# Patient Record
Sex: Male | Born: 1940 | Race: White | Hispanic: No | Marital: Married | Smoking: Former smoker
Health system: Southern US, Community
[De-identification: ages and names within clinical notes are randomized; demographics above are authoritative.]

## PROBLEM LIST (undated history)

## (undated) DIAGNOSIS — I10 Essential (primary) hypertension: Secondary | ICD-10-CM

## (undated) DIAGNOSIS — G4730 Sleep apnea, unspecified: Secondary | ICD-10-CM

## (undated) DIAGNOSIS — Z9289 Personal history of other medical treatment: Secondary | ICD-10-CM

## (undated) DIAGNOSIS — Z789 Other specified health status: Secondary | ICD-10-CM

## (undated) DIAGNOSIS — C801 Malignant (primary) neoplasm, unspecified: Secondary | ICD-10-CM

## (undated) DIAGNOSIS — J449 Chronic obstructive pulmonary disease, unspecified: Secondary | ICD-10-CM

## (undated) DIAGNOSIS — N400 Enlarged prostate without lower urinary tract symptoms: Secondary | ICD-10-CM

## (undated) DIAGNOSIS — D649 Anemia, unspecified: Secondary | ICD-10-CM

## (undated) DIAGNOSIS — N186 End stage renal disease: Secondary | ICD-10-CM

## (undated) DIAGNOSIS — N189 Chronic kidney disease, unspecified: Secondary | ICD-10-CM

## (undated) DIAGNOSIS — E785 Hyperlipidemia, unspecified: Secondary | ICD-10-CM

## (undated) HISTORY — PX: SKIN CANCER EXCISION: SHX779

## (undated) HISTORY — DX: Essential (primary) hypertension: I10

## (undated) HISTORY — DX: Hyperlipidemia, unspecified: E78.5

## (undated) HISTORY — PX: TONSILLECTOMY AND ADENOIDECTOMY: SHX28

## (undated) HISTORY — PX: BACK SURGERY: SHX140

---

## 1998-04-20 ENCOUNTER — Other Ambulatory Visit: Admission: RE | Admit: 1998-04-20 | Discharge: 1998-04-20 | Payer: Self-pay | Admitting: Urology

## 1999-07-06 ENCOUNTER — Other Ambulatory Visit: Admission: RE | Admit: 1999-07-06 | Discharge: 1999-07-06 | Payer: Self-pay | Admitting: Gastroenterology

## 1999-07-06 ENCOUNTER — Encounter (INDEPENDENT_AMBULATORY_CARE_PROVIDER_SITE_OTHER): Payer: Self-pay | Admitting: Specialist

## 2004-05-28 ENCOUNTER — Encounter (INDEPENDENT_AMBULATORY_CARE_PROVIDER_SITE_OTHER): Payer: Self-pay | Admitting: Specialist

## 2004-05-28 ENCOUNTER — Ambulatory Visit (HOSPITAL_BASED_OUTPATIENT_CLINIC_OR_DEPARTMENT_OTHER): Admission: RE | Admit: 2004-05-28 | Discharge: 2004-05-28 | Payer: Self-pay | Admitting: Urology

## 2004-05-28 ENCOUNTER — Ambulatory Visit (HOSPITAL_COMMUNITY): Admission: RE | Admit: 2004-05-28 | Discharge: 2004-05-28 | Payer: Self-pay | Admitting: Urology

## 2005-05-31 ENCOUNTER — Encounter (INDEPENDENT_AMBULATORY_CARE_PROVIDER_SITE_OTHER): Payer: Self-pay | Admitting: *Deleted

## 2005-05-31 ENCOUNTER — Ambulatory Visit (HOSPITAL_BASED_OUTPATIENT_CLINIC_OR_DEPARTMENT_OTHER): Admission: RE | Admit: 2005-05-31 | Discharge: 2005-05-31 | Payer: Self-pay | Admitting: Urology

## 2005-05-31 ENCOUNTER — Ambulatory Visit (HOSPITAL_COMMUNITY): Admission: RE | Admit: 2005-05-31 | Discharge: 2005-05-31 | Payer: Self-pay | Admitting: Urology

## 2005-06-10 ENCOUNTER — Ambulatory Visit: Payer: Self-pay | Admitting: Pulmonary Disease

## 2006-06-10 ENCOUNTER — Ambulatory Visit: Payer: Self-pay | Admitting: Pulmonary Disease

## 2006-06-13 ENCOUNTER — Ambulatory Visit: Payer: Self-pay

## 2007-06-11 ENCOUNTER — Ambulatory Visit: Payer: Self-pay | Admitting: Pulmonary Disease

## 2007-06-11 LAB — CONVERTED CEMR LAB
ALT: 22 units/L (ref 0–53)
AST: 18 units/L (ref 0–37)
Albumin: 4.1 g/dL (ref 3.5–5.2)
Alkaline Phosphatase: 65 units/L (ref 39–117)
BUN: 23 mg/dL (ref 6–23)
Basophils Absolute: 0 10*3/uL (ref 0.0–0.1)
Basophils Relative: 0.1 % (ref 0.0–1.0)
Bilirubin, Direct: 0.2 mg/dL (ref 0.0–0.3)
CO2: 29 meq/L (ref 19–32)
Calcium: 9.4 mg/dL (ref 8.4–10.5)
Chloride: 108 meq/L (ref 96–112)
Cholesterol: 144 mg/dL (ref 0–200)
Creatinine, Ser: 1.1 mg/dL (ref 0.4–1.5)
Eosinophils Absolute: 0.2 10*3/uL (ref 0.0–0.6)
Eosinophils Relative: 2.4 % (ref 0.0–5.0)
GFR calc Af Amer: 86 mL/min
GFR calc non Af Amer: 71 mL/min
Glucose, Bld: 104 mg/dL — ABNORMAL HIGH (ref 70–99)
HCT: 46.8 % (ref 39.0–52.0)
HDL: 41.4 mg/dL (ref >39.0)
Hemoglobin: 16.1 g/dL (ref 13.0–17.0)
LDL Cholesterol: 90 mg/dL (ref 0–99)
Lymphocytes Relative: 20.3 % (ref 12.0–46.0)
MCHC: 34.5 g/dL (ref 30.0–36.0)
MCV: 91.9 fL (ref 78.0–100.0)
Monocytes Absolute: 0.7 10*3/uL (ref 0.2–0.7)
Monocytes Relative: 7.9 % (ref 3.0–11.0)
Neutro Abs: 6.5 10*3/uL (ref 1.4–7.7)
Neutrophils Relative %: 69.3 % (ref 43.0–77.0)
PSA: 3.68 ng/mL (ref 0.10–4.00)
Platelets: 263 10*3/uL (ref 150–400)
Potassium: 4.6 meq/L (ref 3.5–5.1)
RBC: 5.09 M/uL (ref 4.22–5.81)
RDW: 12.9 % (ref 11.5–14.6)
Sodium: 142 meq/L (ref 135–145)
TSH: 0.7 microintl units/mL (ref 0.35–5.50)
Total Bilirubin: 1.3 mg/dL — ABNORMAL HIGH (ref 0.3–1.2)
Total CHOL/HDL Ratio: 3.5
Total Protein: 7.5 g/dL (ref 6.0–8.3)
Triglycerides: 64 mg/dL (ref 0–149)
VLDL: 13 mg/dL (ref 0–40)
WBC: 9.3 10*3/uL (ref 4.5–10.5)

## 2007-07-27 ENCOUNTER — Ambulatory Visit: Payer: Self-pay | Admitting: Internal Medicine

## 2007-08-10 ENCOUNTER — Encounter: Payer: Self-pay | Admitting: Internal Medicine

## 2007-08-10 ENCOUNTER — Ambulatory Visit: Payer: Self-pay | Admitting: Internal Medicine

## 2007-12-07 ENCOUNTER — Telehealth: Payer: Self-pay | Admitting: Pulmonary Disease

## 2007-12-07 DIAGNOSIS — D126 Benign neoplasm of colon, unspecified: Secondary | ICD-10-CM | POA: Insufficient documentation

## 2007-12-07 DIAGNOSIS — E7800 Pure hypercholesterolemia, unspecified: Secondary | ICD-10-CM | POA: Insufficient documentation

## 2007-12-07 DIAGNOSIS — N138 Other obstructive and reflux uropathy: Secondary | ICD-10-CM

## 2007-12-07 DIAGNOSIS — N401 Enlarged prostate with lower urinary tract symptoms: Secondary | ICD-10-CM

## 2007-12-07 DIAGNOSIS — K649 Unspecified hemorrhoids: Secondary | ICD-10-CM | POA: Insufficient documentation

## 2007-12-07 DIAGNOSIS — I10 Essential (primary) hypertension: Secondary | ICD-10-CM

## 2008-08-02 ENCOUNTER — Telehealth (INDEPENDENT_AMBULATORY_CARE_PROVIDER_SITE_OTHER): Payer: Self-pay | Admitting: *Deleted

## 2008-08-18 ENCOUNTER — Encounter: Payer: Self-pay | Admitting: Pulmonary Disease

## 2008-10-10 ENCOUNTER — Ambulatory Visit: Payer: Self-pay | Admitting: Pulmonary Disease

## 2008-10-10 DIAGNOSIS — C679 Malignant neoplasm of bladder, unspecified: Secondary | ICD-10-CM

## 2008-10-10 DIAGNOSIS — J42 Unspecified chronic bronchitis: Secondary | ICD-10-CM

## 2008-10-10 DIAGNOSIS — K5730 Diverticulosis of large intestine without perforation or abscess without bleeding: Secondary | ICD-10-CM

## 2008-10-16 LAB — CONVERTED CEMR LAB
ALT: 33 units/L (ref 0–53)
AST: 21 units/L (ref 0–37)
Albumin: 4 g/dL (ref 3.5–5.2)
Alkaline Phosphatase: 58 units/L (ref 39–117)
BUN: 25 mg/dL — ABNORMAL HIGH (ref 6–23)
Basophils Absolute: 0 10*3/uL (ref 0.0–0.1)
Basophils Relative: 0.5 % (ref 0.0–3.0)
Bilirubin, Direct: 0.1 mg/dL (ref 0.0–0.3)
CO2: 30 meq/L (ref 19–32)
Calcium: 9.2 mg/dL (ref 8.4–10.5)
Chloride: 106 meq/L (ref 96–112)
Cholesterol: 173 mg/dL (ref 0–200)
Creatinine, Ser: 1.1 mg/dL (ref 0.4–1.5)
Eosinophils Absolute: 0.1 10*3/uL (ref 0.0–0.7)
Eosinophils Relative: 1.7 % (ref 0.0–5.0)
GFR calc Af Amer: 86 mL/min
GFR calc non Af Amer: 71 mL/min
Glucose, Bld: 105 mg/dL — ABNORMAL HIGH (ref 70–99)
HCT: 47.2 % (ref 39.0–52.0)
HDL: 51 mg/dL (ref >39.0)
Hemoglobin: 16.4 g/dL (ref 13.0–17.0)
LDL Cholesterol: 106 mg/dL — ABNORMAL HIGH (ref 0–99)
Lymphocytes Relative: 26.1 % (ref 12.0–46.0)
MCHC: 34.8 g/dL (ref 30.0–36.0)
MCV: 91.8 fL (ref 78.0–100.0)
Monocytes Absolute: 0.5 10*3/uL (ref 0.1–1.0)
Monocytes Relative: 7.5 % (ref 3.0–12.0)
Neutro Abs: 4.4 10*3/uL (ref 1.4–7.7)
Neutrophils Relative %: 64.2 % (ref 43.0–77.0)
Platelets: 209 10*3/uL (ref 150–400)
Potassium: 4.4 meq/L (ref 3.5–5.1)
RBC: 5.14 M/uL (ref 4.22–5.81)
RDW: 12.8 % (ref 11.5–14.6)
Sodium: 141 meq/L (ref 135–145)
TSH: 0.87 microintl units/mL (ref 0.35–5.50)
Total Bilirubin: 1 mg/dL (ref 0.3–1.2)
Total CHOL/HDL Ratio: 3.4
Total Protein: 7.3 g/dL (ref 6.0–8.3)
Triglycerides: 82 mg/dL (ref 0–149)
VLDL: 16 mg/dL (ref 0–40)
Vit D, 1,25-Dihydroxy: 22 — ABNORMAL LOW (ref 30–89)
WBC: 6.8 10*3/uL (ref 4.5–10.5)

## 2008-11-20 DIAGNOSIS — E663 Overweight: Secondary | ICD-10-CM | POA: Insufficient documentation

## 2008-11-20 DIAGNOSIS — K8020 Calculus of gallbladder without cholecystitis without obstruction: Secondary | ICD-10-CM | POA: Insufficient documentation

## 2009-09-01 ENCOUNTER — Encounter: Payer: Self-pay | Admitting: Pulmonary Disease

## 2009-10-13 ENCOUNTER — Encounter: Payer: Self-pay | Admitting: Adult Health

## 2009-10-13 ENCOUNTER — Ambulatory Visit: Payer: Self-pay | Admitting: Pulmonary Disease

## 2009-10-13 ENCOUNTER — Ambulatory Visit: Payer: Self-pay

## 2009-10-13 DIAGNOSIS — R609 Edema, unspecified: Secondary | ICD-10-CM

## 2009-10-16 LAB — CONVERTED CEMR LAB
BUN: 18 mg/dL (ref 6–23)
CO2: 31 meq/L (ref 19–32)
Calcium: 9.3 mg/dL (ref 8.4–10.5)
Chloride: 101 meq/L (ref 96–112)
Creatinine, Ser: 1 mg/dL (ref 0.4–1.5)
GFR calc non Af Amer: 78.66 mL/min (ref >60)
Glucose, Bld: 102 mg/dL — ABNORMAL HIGH (ref 70–99)
Potassium: 4.6 meq/L (ref 3.5–5.1)
Pro B Natriuretic peptide (BNP): 73 pg/mL (ref 0.0–100.0)
Sodium: 138 meq/L (ref 135–145)
TSH: 1.44 microintl units/mL (ref 0.35–5.50)

## 2010-01-15 ENCOUNTER — Ambulatory Visit: Payer: Self-pay | Admitting: Pulmonary Disease

## 2010-01-21 LAB — CONVERTED CEMR LAB
ALT: 27 units/L (ref 0–53)
AST: 19 units/L (ref 0–37)
Albumin: 4.2 g/dL (ref 3.5–5.2)
Alkaline Phosphatase: 57 units/L (ref 39–117)
BUN: 18 mg/dL (ref 6–23)
Basophils Absolute: 0 10*3/uL (ref 0.0–0.1)
Basophils Relative: 0.5 % (ref 0.0–3.0)
Bilirubin Urine: NEGATIVE
Bilirubin, Direct: 0.3 mg/dL (ref 0.0–0.3)
CO2: 31 meq/L (ref 19–32)
Calcium: 9.3 mg/dL (ref 8.4–10.5)
Chloride: 105 meq/L (ref 96–112)
Cholesterol: 158 mg/dL (ref 0–200)
Creatinine, Ser: 1.1 mg/dL (ref 0.4–1.5)
Eosinophils Absolute: 0.2 10*3/uL (ref 0.0–0.7)
Eosinophils Relative: 2.4 % (ref 0.0–5.0)
GFR calc non Af Amer: 70.41 mL/min (ref >60)
Glucose, Bld: 103 mg/dL — ABNORMAL HIGH (ref 70–99)
HCT: 48.3 % (ref 39.0–52.0)
HDL: 53.8 mg/dL (ref >39.00)
Hemoglobin: 16.3 g/dL (ref 13.0–17.0)
Ketones, ur: NEGATIVE mg/dL
LDL Cholesterol: 81 mg/dL (ref 0–99)
Lymphocytes Relative: 26.6 % (ref 12.0–46.0)
Lymphs Abs: 1.9 10*3/uL (ref 0.7–4.0)
MCHC: 33.8 g/dL (ref 30.0–36.0)
MCV: 94.9 fL (ref 78.0–100.0)
Monocytes Absolute: 0.6 10*3/uL (ref 0.1–1.0)
Monocytes Relative: 8.3 % (ref 3.0–12.0)
Neutro Abs: 4.3 10*3/uL (ref 1.4–7.7)
Neutrophils Relative %: 62.2 % (ref 43.0–77.0)
Nitrite: NEGATIVE
PSA: 3.64 ng/mL (ref 0.10–4.00)
Platelets: 252 10*3/uL (ref 150.0–400.0)
Potassium: 4.4 meq/L (ref 3.5–5.1)
RBC: 5.09 M/uL (ref 4.22–5.81)
RDW: 12.6 % (ref 11.5–14.6)
Sodium: 142 meq/L (ref 135–145)
Specific Gravity, Urine: >=1.03 (ref 1.000–1.030)
TSH: 0.96 microintl units/mL (ref 0.35–5.50)
Total Bilirubin: 1.1 mg/dL (ref 0.3–1.2)
Total CHOL/HDL Ratio: 3
Total Protein, Urine: 100 mg/dL
Total Protein: 7.9 g/dL (ref 6.0–8.3)
Triglycerides: 116 mg/dL (ref 0.0–149.0)
Urine Glucose: NEGATIVE mg/dL
Urobilinogen, UA: 0.2 (ref 0.0–1.0)
VLDL: 23.2 mg/dL (ref 0.0–40.0)
Vit D, 25-Hydroxy: 50 ng/mL (ref 30–89)
WBC: 7 10*3/uL (ref 4.5–10.5)
pH: 6 (ref 5.0–8.0)

## 2010-01-22 ENCOUNTER — Telehealth: Payer: Self-pay | Admitting: Pulmonary Disease

## 2010-01-28 DIAGNOSIS — I872 Venous insufficiency (chronic) (peripheral): Secondary | ICD-10-CM | POA: Insufficient documentation

## 2010-01-29 DIAGNOSIS — E559 Vitamin D deficiency, unspecified: Secondary | ICD-10-CM

## 2010-01-29 DIAGNOSIS — M1990 Unspecified osteoarthritis, unspecified site: Secondary | ICD-10-CM | POA: Insufficient documentation

## 2010-01-30 ENCOUNTER — Ambulatory Visit: Payer: Self-pay | Admitting: Pulmonary Disease

## 2010-01-31 LAB — CONVERTED CEMR LAB
Bilirubin Urine: NEGATIVE
Ketones, ur: NEGATIVE mg/dL
Leukocytes, UA: NEGATIVE
Nitrite: NEGATIVE
Specific Gravity, Urine: 1.02
Total Protein, Urine: 30 mg/dL
Urine Glucose: NEGATIVE mg/dL
Urobilinogen, UA: 0.2
pH: 6

## 2010-09-02 ENCOUNTER — Encounter: Payer: Self-pay | Admitting: Pulmonary Disease

## 2010-09-04 ENCOUNTER — Encounter: Payer: Self-pay | Admitting: Pulmonary Disease

## 2010-11-27 ENCOUNTER — Other Ambulatory Visit: Payer: Self-pay | Admitting: Dermatology

## 2010-12-11 NOTE — Progress Notes (Signed)
Summary: medication  Phone Note Call from Patient   Caller: Patient Call For: nadel Summary of Call: pt calling to see when cipro will be called to pharmacy for urinary tract infection [facility] [address] Initial call taken by: Gustavus Bryant,  January 22, 2010 2:03 PM  Follow-up for Phone Call        per append to labs pt has U/A and SN rx cipro 500mg  two times a day x 7 days and then repeat U/A. Rx sent. pt advised and order palced for repeat U/A. [place] Bing CMA  January 22, 2010 2:22 PM     New/Updated Medications: CIPRO 500 MG TABS (CIPROFLOXACIN HCL) Take 1 tablet by mouth two times a day Prescriptions: CIPRO 500 MG TABS (CIPROFLOXACIN HCL) Take 1 tablet by mouth two times a day  #14 x 0   Entered by:   [place] Bing CMA   Authorized by:   Noralee Space MD   Signed by:   [place] Bing CMA on 01/22/2010   Method used:   Electronically to        [facility] # [id number] (retail)       [address]       [place], [place]  [address]       Ph: [phone] or [phone]       Fax: [phone]   RxID:   [id number]

## 2010-12-11 NOTE — Assessment & Plan Note (Signed)
Summary: cpx/ mbw   CC:  15 month ROV & review of mult medical problems....  History of Present Illness: 70 y/o WM here for a follow up visit... he has multiple medical problems as noted below...     ~  January 15, 2010:  he has been doing well- no new complaints or concerns... plays golf regularly- walking & exercising legs/ knees... still smokes an occas cig & he is encouraged to discontinue all smoking... BP controlled on meds;  Chol at goals w/ Simva40;  GI & GU stable- see below...  refills today + TDAP.    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 uses Mucinex as needed.  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  ~  CXR 3/11 showed some scarring at the bases, no change...  HYPERTENSION (ICD-401.9) - on METOPROLOL XR 50mg /d,  AMLODIPINE 10mg /d,  LISINOPRIL 20mg /d...  he also takes ASA 81mg /d... he has seen DrWall in the past... BP= 138/76 & similar at home, takes meds regularly & tolerates well... denies HA, fatigue, visual changes, CP, palipit, dizziness, syncope, dyspnea, edema, etc...   ~  NuclearStressTest 8/07 showed a hypertensive response but no scar or ischemia, EF= 63%... no change from 2002 study... Toprol added...  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 he knows to elim salt, elevate, wear support hose, etc...  HYPERCHOLESTEROLEMIA (ICD-272.0) - on SIMVASTATIN 40mg /d...   ~  [place] 7/08 showed TChol 144, TG 64, HDL 41, LDL 90...   ~  [place] 12/09 showed TChol 173, TG 82, HDL 51, LDL 106... rec- same med + better diet.  ~  FLP 3/11 showed TChol 158, TG 116, HDL 54, LDL 81  OVERWEIGHT (ICD-278.02) -   ~  weight 11/09 = 212# since he cut way back on smoking... we discussed diet + exercise etc...  ~  weight 3/11 = 207#  DIVERTICULOSIS OF COLON (ICD-562.10),  COLONIC POLYPS (ICD-211.3),  &  HEMORRHOIDS (ICD-455.6) -  - last colonoscopy 9/08 by DrDBrodie showed several 3-19mm polyps & divertics... path= adenomatous and f/u planned in 5 yrs...  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 he gets cystoscopies every 4-6 months now...BPH w/ mild obstructive symptoms... intermittently elevated PSA on testing in the past...  ~  labs 3/11 showed PSA = 3.64  DEGENERATIVE JOINT DISEASE (ICD-715.90) - eval for left knee pain by DrAlusio w/ torn cartilage & baker's cyst... knee tapped, given shot w/ relief...  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  ~  labs 11/09 showed Vit D level = 22... rec to start 2000 u daily.  ~  labs 3/11 showed Vit D level = 50... continue supplemental Vit D at 1000 u daily.  Health Maintenance:  ~  Immunizations:  he gets the seasonal flu vaccine yearly;  had the PNEUMOVAX 2009 (age66);  OK TDAP today 3/11...   Allergies: 1)  ! Penicillin  Comments:  Nurse/Medical Assistant: The patient's medications and allergies were reviewed with the patient and were updated in the Medication and Allergy Lists.  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 mother deceased age 53 from heart disease father deceased age 33  Social History: Reviewed history from 10/10/2008 and no changes required. married 2 children 1 son 36--hx of DM 1 child age 51 quit smoking in 2008 exercises 2 times per week caffeine use--4 cups per day etoh--red wine  Review of Systems      See HPI  The patient denies anorexia, fever, weight loss, weight gain, vision loss, decreased hearing, hoarseness, chest pain, syncope, dyspnea on exertion, peripheral edema, prolonged cough, headaches, hemoptysis, abdominal pain, melena, hematochezia, severe indigestion/heartburn, hematuria, incontinence, muscle weakness, suspicious skin lesions, transient blindness, difficulty walking, depression, unusual weight change, abnormal bleeding, enlarged lymph nodes, and angioedema.    Vital Signs:  Patient profile:   70 year old male Height:      70.5 inches Weight:      207 pounds O2 Sat:      97 % on Room air Temp:     97.2 degrees F oral Pulse rate:   78 / minute BP sitting:   138 / 76  (left arm) Cuff size:   regular  Vitals Entered By: Elita Boone CMA (January 15, 2010 9:28 AM)  O2 Sat at Rest %:  97 O2 Flow:  Room air CC: 15 month ROV & review of mult medical problems... Is Patient Diabetic? No Pain Assessment Patient in pain? no      Comments pt brought all meds today---meds updated today   Physical Exam  Additional Exam:  WD, Overweight, 70 y/o WM in NAD... GENERAL:  Alert & oriented; pleasant & cooperative... HEENT:  [place]/AT, EOM-wnl, PERRLA, EACs-clear, TMs-wnl, NOSE-clear, THROAT-clear & wnl. NECK:  Supple w/ fairROM; no JVD; normal carotid impulses w/o bruits; no thyromegaly or nodules palpated; no lymphadenopathy. CHEST:  Clear to P & A; without wheezes/ rales/ or rhonchi heard... HEART:  Regular Rhythm;  without murmurs/ rubs/ or gallops detected... ABDOMEN:  Soft & nontender; normal bowel sounds; no organomegaly or masses palpated... EXT: without deformities, mild arthritic changes; no varicose veins, +ven insuffic, tr edema... NEURO:  CN's intact; motor testing normal; sensory testing normal; gait normal & balance OK. DERM:  No lesions noted; no rash etc...    MISC. Report  Procedure date:  01/15/2010  Findings:      Fasting labs 01/15/10 all reviewed & pt notified... SN   Impression & Recommendations:  Problem # 1:  BRONCHITIS, CHRONIC (ICD-491.9) CXR is OK- just chr changes... he must quit the last of those cigarettes!!! Orders: T-2 View CXR ([id number])  Problem # 2:  HYPERTENSION (ICD-401.9) Controlled-  same meds... His updated medication list for this problem includes:    Metoprolol Succinate 50 Mg Xr24h-tab (Metoprolol succinate) .Marland Kitchen... Take 1 tablet by mouth once a day    Amlodipine Besylate 10 Mg Tabs (Amlodipine besylate) .Marland Kitchen... Take 1 tablet by mouth once a day    Lisinopril 20 Mg Tabs (Lisinopril) .Marland Kitchen... Take 1 tablet by mouth once a day  Orders: T-Vitamin D (25-Hydroxy) [id number]) T-2 View CXR (71020TC)  TLB-Lipid Panel (80061-LIPID) TLB-BMP (Basic Metabolic Panel-BMET) (99991111) TLB-Hepatic/Liver Function Pnl (80076-HEPATIC) TLB-CBC Platelet - w/Differential (85025-CBCD) TLB-TSH (Thyroid Stimulating Hormone) (84443-TSH) TLB-PSA (Prostate Specific Antigen) (84153-PSA) TLB-Udip w/ Micro (81001-URINE)  Problem # 3:  VENOUS INSUFFICIENCY (ICD-459.81) He stopped the prev Lasix20... follows low sodium diet, elevates, support hose, etc...  Problem # 4:  HYPERCHOLESTEROLEMIA (ICD-272.0) Stable on the Simva40... continue same. His updated medication list for this problem includes:    Simvastatin 40 Mg Tabs (Simvastatin) .Marland Kitchen... Take one tablet by mouth at bedtime  Problem # 5:  COLONIC POLYPS (ICD-211.3) GI is stable and up to date...  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  Problem # 8:  OTHER MEDICAL PROBLEMS AS NOTED>>> OK TDAP today...  Complete Medication List: 1)  Adprin B 325 Mg Tabs (Aspirin buf(cacarb-mgcarb-mgo)) .... Take 1 tablet by mouth once a day 2)  Metoprolol Succinate 50 Mg Xr24h-tab (Metoprolol succinate) .... Take 1 tablet by mouth once a day 3)  Amlodipine Besylate 10 Mg Tabs (Amlodipine besylate) .... Take 1 tablet by mouth once a day 4)  Lisinopril 20 Mg Tabs (Lisinopril) .... Take 1 tablet by mouth once a day 5)  Simvastatin 40 Mg Tabs (Simvastatin) .... Take one tablet by mouth at bedtime 6)  Vitamin D3 1000 Unit Caps (Cholecalciferol) .... Take 1 capsule by mouth two times a day 7)  Cipro 500 Mg Tabs (Ciprofloxacin hcl) .... Take 1 tablet by mouth two times a day  Other Orders: Prescription Created Electronically [id number]) State-TD Vaccine 7 yrs. & > IM [id number]) Admin 1st Vaccine [id number])  Patient Instructions: 1)  Today we updated your med list- see below.... 2)  We refilled your meds for 2011... 3)  Today we did your follow up CXR & FASTING blood work... please call the "phone tree" in a few days for your lab results.Marland KitchenMarland Kitchen 4)  We gave you the TDaP tetanus vaccine today as well... 5)  Stay as active as poss... 6)  Call for any problems.Marland KitchenMarland Kitchen 7)  Please schedule a follow-up appointment in 1 year, sooner as needed... Prescriptions: SIMVASTATIN 40 MG  TABS (SIMVASTATIN) take one tablet by mouth at bedtime  #90 x prn   Entered and Authorized by:   Noralee Space MD   Signed by:   Noralee Space MD on 01/15/2010   Method used:   Print then Give to Patient   RxID:   [id number] LISINOPRIL 20 MG  TABS (LISINOPRIL) Take 1 tablet by mouth once a day  #90 x prn   Entered and Authorized by:   Noralee Space MD   Signed by:   Noralee Space MD on  01/15/2010   Method used:   Print then Give to Patient   RxID:   [id number] AMLODIPINE BESYLATE 10 MG  TABS (AMLODIPINE BESYLATE) Take 1 tablet by mouth once a day  #90 x prn   Entered and Authorized by:   Noralee Space MD   Signed by:   Noralee Space MD on 01/15/2010   Method used:   Print then Give to Patient   RxID:   [id number] METOPROLOL SUCCINATE 50 MG  XR24H-TAB (METOPROLOL SUCCINATE) Take 1 tablet by mouth once a day  #90 x prn   Entered and Authorized by:   Noralee Space MD   Signed by:  Noralee Space MD on 01/15/2010   Method used:   Print then Give to Patient   RxID:   [id number]    Immunizations Administered:  Tetanus Vaccine:    Vaccine Type: Tdap (State)    Site: left deltoid    Mfr: GlaxoSmithKline    Dose: 0.5 ml    Route: IM    Given by: Parke Poisson CNA    Exp. Date: 01/06/2012    Lot #: [id number]    VIS given: 09/29/07 version given January 15, 2010.

## 2010-12-11 NOTE — Miscellaneous (Signed)
Summary: flu vaccine from [facility]  Clinical Lists Changes  Observations: Added new observation of FLU VAX: Historical (09/02/2010 8:48)      Immunization History:  Influenza Immunization History:    Influenza:  historical (09/02/2010)

## 2010-12-11 NOTE — Miscellaneous (Signed)
Summary: Flu Vaccine / [facility]  Flu Vaccine / [facility]   Imported By: Rise Patience 09/06/2010 11:46:32  _____________________________________________________________________  External Attachment:    Type:   Image     Comment:   External Document

## 2011-01-14 ENCOUNTER — Ambulatory Visit (INDEPENDENT_AMBULATORY_CARE_PROVIDER_SITE_OTHER): Payer: Medicare Other | Admitting: Pulmonary Disease

## 2011-01-14 ENCOUNTER — Ambulatory Visit (INDEPENDENT_AMBULATORY_CARE_PROVIDER_SITE_OTHER)
Admission: RE | Admit: 2011-01-14 | Discharge: 2011-01-14 | Disposition: A | Discharge status: Home / Self Care | Payer: Medicare Other | Source: Ambulatory Visit | Attending: Pulmonary Disease | Admitting: Pulmonary Disease

## 2011-01-14 ENCOUNTER — Encounter: Payer: Self-pay | Admitting: Pulmonary Disease

## 2011-01-14 ENCOUNTER — Other Ambulatory Visit: Payer: Self-pay | Admitting: Pulmonary Disease

## 2011-01-14 DIAGNOSIS — Z125 Encounter for screening for malignant neoplasm of prostate: Secondary | ICD-10-CM

## 2011-01-14 DIAGNOSIS — K649 Unspecified hemorrhoids: Secondary | ICD-10-CM

## 2011-01-14 DIAGNOSIS — I10 Essential (primary) hypertension: Secondary | ICD-10-CM

## 2011-01-14 DIAGNOSIS — E663 Overweight: Secondary | ICD-10-CM

## 2011-01-14 DIAGNOSIS — C679 Malignant neoplasm of bladder, unspecified: Secondary | ICD-10-CM

## 2011-01-14 DIAGNOSIS — F17200 Nicotine dependence, unspecified, uncomplicated: Secondary | ICD-10-CM

## 2011-01-14 DIAGNOSIS — K5730 Diverticulosis of large intestine without perforation or abscess without bleeding: Secondary | ICD-10-CM

## 2011-01-14 DIAGNOSIS — D126 Benign neoplasm of colon, unspecified: Secondary | ICD-10-CM

## 2011-01-14 DIAGNOSIS — E7800 Pure hypercholesterolemia, unspecified: Secondary | ICD-10-CM

## 2011-01-14 DIAGNOSIS — J42 Unspecified chronic bronchitis: Secondary | ICD-10-CM

## 2011-01-14 DIAGNOSIS — I872 Venous insufficiency (chronic) (peripheral): Secondary | ICD-10-CM

## 2011-01-14 LAB — LIPID PANEL
HDL: 54.2 mg/dL (ref >39.00)
Total CHOL/HDL Ratio: 3
Triglycerides: 55 mg/dL (ref 0.0–149.0)
VLDL: 11 mg/dL (ref 0.0–40.0)

## 2011-01-14 LAB — BASIC METABOLIC PANEL
BUN: 22 mg/dL (ref 6–23)
CO2: 28 mEq/L (ref 19–32)
Calcium: 9.2 mg/dL (ref 8.4–10.5)
Chloride: 100 mEq/L (ref 96–112)
Creatinine, Ser: 1.1 mg/dL (ref 0.4–1.5)
Glucose, Bld: 97 mg/dL (ref 70–99)

## 2011-01-14 LAB — URINALYSIS, ROUTINE W REFLEX MICROSCOPIC
Bilirubin Urine: NEGATIVE
Hgb urine dipstick: NEGATIVE
Ketones, ur: NEGATIVE
Total Protein, Urine: 30
Urine Glucose: NEGATIVE
pH: 6 (ref 5.0–8.0)

## 2011-01-14 LAB — CBC WITH DIFFERENTIAL/PLATELET
Basophils Absolute: 0 10*3/uL (ref 0.0–0.1)
Basophils Relative: 0.5 % (ref 0.0–3.0)
Eosinophils Absolute: 0.2 10*3/uL (ref 0.0–0.7)
Hemoglobin: 16.6 g/dL (ref 13.0–17.0)
Lymphocytes Relative: 28.2 % (ref 12.0–46.0)
MCHC: 35.2 g/dL (ref 30.0–36.0)
Monocytes Relative: 8.8 % (ref 3.0–12.0)
Neutro Abs: 4.5 10*3/uL (ref 1.4–7.7)
Neutrophils Relative %: 60 % (ref 43.0–77.0)
RBC: 5.1 Mil/uL (ref 4.22–5.81)

## 2011-01-14 LAB — HEPATIC FUNCTION PANEL
ALT: 24 U/L (ref 0–53)
Albumin: 4.3 g/dL (ref 3.5–5.2)
Bilirubin, Direct: 0.2 mg/dL (ref 0.0–0.3)
Total Protein: 7.5 g/dL (ref 6.0–8.3)

## 2011-01-29 NOTE — Assessment & Plan Note (Signed)
Summary: 1 year follow up   CC:  Yearly ROV & review of mult medical problems....  History of Present Illness: 70 y/o Jeremy [name] here for a follow up visit... he has multiple medical problems as noted below...     ~  January 15, 2010:  he has been doing well- no new complaints or concerns... plays golf regularly- walking & exercising legs/ knees... still smokes an occas cig & he is encouraged to discontinue all smoking... BP controlled on meds;  Chol at goals w/ Simva40;  GI & GU stable- see below...  refills today + TDAP.   ~  January 14, 2011:  Yearly ROV- doing well, no new complaints or concerns, & no interval problems>>>    Continues to smoke on occas> denies cough, sputum, SOB, & he remains active w/ walking & golf;  we discussed this again...    BP controlled on Metop ==> 126/80    Cholesterol stable on Simva40 ==> TChol 155, TG 55, HDL 54, LDL 90    GI (divertics, polyps,hems) stable & colon f/u due 2013 per DrDBrodie...    GU (BPH, hx of Bladder Ca) followed by DrOttelin yearly & rov due 4/12, PSA=3.98...    Hx DJD & Vit D defic> followed by DrAlusio & he's taking 2000u VitD supplement...   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 uses Mucinex as needed.  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  ~  CXR 3/11 showed some scarring at the bases, no change...  ~  CXR 3/12 showed chr changes, incr markings, basilar scarring, NAD.  HYPERTENSION (ICD-401.9) - on METOPROLOL XR 50mg /d,  AMLODIPINE 10mg /d,  LISINOPRIL 20mg /d...  he also takes ASA 81mg /d... he has seen DrWall in the past... BP= 126/80 & similar at home, takes meds regularly & tolerates well... denies HA, fatigue, visual changes, CP, palipit, dizziness, syncope, dyspnea, edema, etc...   ~  NuclearStressTest 8/07 showed a hypertensive response but no scar or ischemia, EF=  63%... no change from 2002 study... Toprol added...  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 he knows to elim salt, elevate, wear support hose, etc...  HYPERCHOLESTEROLEMIA (ICD-272.0) - on SIMVASTATIN 40mg /d...   ~  [place] 7/08 showed TChol 144, TG 64, HDL 41, LDL 90...   ~  [place] 12/09 showed TChol 173, TG Jeremy, HDL 51, LDL 106... rec- same med + better diet.  ~  FLP 3/11 showed TChol 158, TG 116, HDL 54, LDL 81  OVERWEIGHT (ICD-278.02) -   ~  weight 11/09 = 212# since he cut way back on smoking... we discussed diet + exercise etc...  ~  weight 3/11 = 207#  ~  weight 3/12 = 211#  DIVERTICULOSIS OF COLON (ICD-562.10),  COLONIC POLYPS (ICD-211.3),  & HEMORRHOIDS (ICD-455.6) -  - last colonoscopy 9/08 by DrDBrodie showed several 3-1mm polyps & divertics... path= adenomatous and f/u planned in 5 yrs...  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 he gets cystoscopies every 4-6 months now...BPH w/ mild obstructive symptoms... intermittently elevated PSA on testing in the past...  ~  labs 3/11 showed PSA = 3.64  ~  labs 3/12 showed PSA= 3.98  DEGENERATIVE JOINT DISEASE (ICD-715.90) - eval for left knee pain by DrAlusio w/ torn cartilage & baker's cyst... knee tapped, given shot w/ relief...  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  ~  labs 11/09 showed Vit D level = 22... rec to start 2000 u daily.  ~  labs 3/11 showed Vit D level = 50... continue supplemental Vit D at 1000 u daily.  Health Maintenance:  ~  Immunizations:  he gets the seasonal flu vaccine yearly;  had the PNEUMOVAX 2009 (age66);  OK TDAP today 3/11...   Preventive Screening-Counseling &  Management  Alcohol-Tobacco     Smoking Status: quit     Year Quit: 2008  Allergies: 1)  ! Penicillin  Comments:  Nurse/Medical Assistant: The patient's medications and allergies were reviewed with the patient and were updated in the Medication and Allergy Lists.  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 mother deceased age 14 from heart disease father deceased age 42  Social History: Reviewed history from 10/10/2008 and no changes required. married 2 children 1 son 36--hx of DM 1 child age 66 quit smoking in 2008 exercises 2 times per week caffeine use--4 cups per day etoh--red wine  Review of Systems      See HPI  Vital Signs:  Patient profile:   70 year old male Height:      70.5 inches Weight:      210.50 pounds BMI:     29.88 O2 Sat:      97 % on Room air Temp:     97.0 degrees F oral Pulse rate:   70 / minute BP sitting:   126 / 80  (left arm) Cuff size:   regular  Vitals Entered By: Elita Boone CMA (January 14, 2011 8:53 AM)  O2 Sat at Rest %:  97 O2 Flow:  Room air CC: Yearly ROV & review of mult medical problems... Is Patient Diabetic? No Pain Assessment Patient in pain? no      Comments meds updated today with pt---pt brought all of his meds today    Physical Exam  Additional Exam:   WD, Overweight, 70 y/o Jeremy [name] in NAD... GENERAL:  Alert & oriented; pleasant & cooperative... HEENT:  [place]/AT, EOM-wnl, PERRLA, EACs-clear, TMs-wnl, NOSE-clear, THROAT-clear & wnl. NECK:  Supple w/ fairROM; no JVD; normal carotid impulses w/o bruits; no thyromegaly or nodules palpated; no lymphadenopathy. CHEST:  Clear to P & A; without wheezes/ rales/ or rhonchi heard... HEART:  Regular Rhythm; without murmurs/ rubs/ or gallops detected... ABDOMEN:  Soft & nontender; normal bowel sounds; no organomegaly or masses palpated... EXT: without deformities, mild arthritic changes; no varicose veins, +ven insuffic, tr edema... NEURO:  CN's intact; motor testing normal; sensory testing normal; gait normal & balance OK. DERM:  No lesions noted; no rash etc...    Impression & Recommendations:  Problem # 1:  BRONCHITIS, CHRONIC (ICD-491.9) He is stable from the Pulm standpoint &  he denies cough, phlegm, dyspnea... we discussed & reviewed the importance of smoking cessation> he certainly understands but declines help & is not motivated to do better... Orders: T-2 View CXR ([id number])  Problem # 2:  HYPERTENSION (ICD-401.9) Controlled on meds>  continue same. His updated medication list for this problem includes:    Metoprolol Succinate 50 Mg Xr24h-tab (Metoprolol succinate) .Marland Kitchen... Take 1 tablet by mouth once a day    Amlodipine Besylate 10 Mg Tabs (Amlodipine besylate) .Marland Kitchen... Take 1 tablet by mouth once a day    Lisinopril 20 Mg Tabs (Lisinopril) .Marland Kitchen... Take 1 tablet by mouth once a day  Orders: T-2 View CXR (71020TC) TLB-BMP (Basic Metabolic Panel-BMET) (99991111) TLB-Hepatic/Liver Function Pnl (80076-HEPATIC) TLB-CBC Platelet - w/Differential (85025-CBCD) TLB-Lipid Panel (80061-LIPID) TLB-TSH (Thyroid Stimulating Hormone) (84443-TSH) TLB-PSA (Prostate Specific Antigen) (84153-PSA) TLB-Udip w/ Micro (81001-URINE)  Problem # 3:  HYPERCHOLESTEROLEMIA (ICD-272.0) He continues to do well on the  Simva40... His updated medication list for this problem includes:    Simvastatin 40 Mg Tabs (Simvastatin) .Marland Kitchen... Take one tablet by mouth at bedtime  Problem # 4:  OVERWEIGHT (ICD-278.02) Weight is up a few lbs & we reviewed diet + exercise program...  Problem # 5:  COLONIC POLYPS (ICD-211.3) Stable GI & f/u colon due 9/13...  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  Problem # 7:  OTHER MEDICAL PROBLEMS AS NOTED>>>  Complete Medication List: 1)  Aspirin 81 Mg Tabs (Aspirin) .... Take 2 tabs by mouth once daily.Marland KitchenMarland Kitchen 2)  Metoprolol Succinate 50 Mg Xr24h-tab (Metoprolol succinate) .... Take 1 tablet by mouth once a day 3)  Amlodipine Besylate 10 Mg Tabs (Amlodipine besylate) .... Take 1 tablet by mouth once a day 4)  Lisinopril 20 Mg Tabs (Lisinopril) .... Take 1 tablet by mouth once a day 5)  Simvastatin 40 Mg Tabs (Simvastatin) .... Take one tablet by mouth at bedtime 6)  Vitamin D3 1000 Unit Caps (Cholecalciferol) .... Take 1 capsule by mouth once daily  Patient Instructions: 1)  Today we updated your med list- see below.... 2)  We refilled your meds for 2012... 3)  Today we did your follow up CXR & FASTING blood work... 4)  please call the "phone tree" in a few days for your lab results.Marland KitchenMarland Kitchen  5)  Stay as active as poss... & QUIT the last of those cigarettes.Marland KitchenMarland Kitchen 6)  Call for any questions.Marland KitchenMarland Kitchen 7)  Please schedule a follow-up appointment in 1 year, sooner as needed. Prescriptions: SIMVASTATIN 40 MG  TABS (SIMVASTATIN) take one tablet by mouth at bedtime  #90 x 4   Entered and Authorized by:   Noralee Space MD   Signed by:   Noralee Space MD on 01/14/2011   Method used:   Print then Give to Patient   RxID:   [id number] LISINOPRIL 20 MG  TABS (LISINOPRIL) Take 1 tablet by mouth once a day  #90 x 4   Entered and Authorized by:   Noralee Space MD   Signed by:   Noralee Space MD on 01/14/2011   Method  used:   Print then Give to Patient   RxID:   [id number] AMLODIPINE BESYLATE 10 MG  TABS (AMLODIPINE BESYLATE) Take 1 tablet by mouth once a day  #90 x 4   Entered and Authorized by:   Noralee Space MD   Signed by:   Noralee Space MD on 01/14/2011  Method used:   Print then Give to Patient   RxID:   [id number] METOPROLOL SUCCINATE 50 MG  XR24H-TAB (METOPROLOL SUCCINATE) Take 1 tablet by mouth once a day  #90 x 4   Entered and Authorized by:   Noralee Space MD   Signed by:   Noralee Space MD on 01/14/2011   Method used:   Print then Give to Patient   RxID:   [id number]

## 2011-03-29 NOTE — Op Note (Signed)
NAME:  Jeremy [name], Jeremy [name] NO.:  [id number]   MEDICAL RECORD NO.:  [id number]          PATIENT TYPE:  AMB   LOCATION:  [facility]                         FACILITY:  [facility]   PHYSICIAN:  Mark C. Karsten Ro, M.D.  DATE OF BIRTH:  [birth year]   DATE OF PROCEDURE:  05/31/2005  DATE OF DISCHARGE:                                 OPERATIVE REPORT   PREOPERATIVE DIAGNOSIS:  Bladder tumor.   POSTOPERATIVE DIAGNOSIS:  Bladder tumor.   PROCEDURE:  Transurethral resection of bladder tumor, aggregate size  approximately 2 cm.   SURGEON:  Mark C. Karsten Ro, M.D.   ANESTHESIA:  General.   SPECIMENS:  Portion of the tumor from right wall and tumor and underlying  tissue from the left wall.   BLOOD LOSS:  None.   COMPLICATIONS:  None.   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 He did well until he had a  recurrence in June of 2005. That was also low grade papillary and  superficial. He has continued on surveillance cystoscopy and I found these  two bladder tumors on his routine follow-up surveillance cystoscopy. He is  brought to the OR today for resection understanding the risks, complications  and alternatives.   DESCRIPTION OF PROCEDURE:  After informed consent, the patient was brought  to the major OR, placed on the table, administered general anesthesia and  then moved to the dorsal lithotomy position. The genitalia was sterilely  prepped and draped and the urethra was sounded with Leander Rams sounds to 48-  [place]. I then used a 28-French resectoscope sheath with [place]lake  obturator, placed this within the bladder and removed the obturator  inserting the 12 degrees with resectoscope element. I inspected the bladder  completely and noted the two tumors and no other worrisome lesions were  found upon entire inspection of the bladder. Both of these tumors were  located away from the ureteral orifices  posterior to them on each side.  Withdrawing the scope into the prostatic urethra, I noted mild bilobar  hypertrophy but no prostatic lesions were seen.   Resection was undertaken by first per resecting the right tumor which was  smaller. It was superficial and essentially disintegrated during my attempt  to resect it in one single piece. I therefore fulgurated extensively the  base of the tumor and surrounding mucosa. I then directed my attention to  the tumor on the left wall was able to obtain a tissue and deep beneath the  tumor resecting this and the tumor itself. I then fulgurated the site of the  tumor resection as well as the surrounding mucosa. No bleeding was noted and  after each of the tumors was resected, they were removed from the bladder. I  therefore drained the bladder and the patient was awakened and taken to  recovery room  in stable satisfactory condition. He tolerated the procedure well with no  intraoperative complications. He will be given a prescription for Pyridium  Plus #24 and Darvocet-N 100 #12. I  will contact him with the results of his  pathology and if indicated will discuss the course of BCG versus continued  surveillance cystoscopy.       MCO/MEDQ  D:  05/31/2005  T:  05/31/2005  Job:  [id number]

## 2011-03-29 NOTE — Op Note (Signed)
NAME:  QUILLIAN, Jeremy [name] NO.:  [id number]   MEDICAL RECORD NO.:  [id number]                   PATIENT TYPE:  AMB   LOCATION:  [facility]                                 FACILITY:  [facility]   PHYSICIAN:  Mark C. Karsten Ro, M.D.               DATE OF BIRTH:  1940-12-02   DATE OF PROCEDURE:  05/28/2004  DATE OF DISCHARGE:                                 OPERATIVE REPORT   PREOPERATIVE DIAGNOSIS:  Bladder tumor.   POSTOPERATIVE DIAGNOSIS:  Bladder tumor.   PROCEDURE:  Transurethral resection of bladder tumor (1.5 cm).   SURGEON:  Mark C. Karsten Ro, M.D.   ANESTHESIA:  General.   BLOOD LOSS:  Less than 5 cc.   DRAINS:  None.   SPECIMENS:  Bladder tumor to pathology.   COMPLICATIONS:  None.   INDICATIONS:  The patient is a 70 year old white male with a history of low-  grade papillary TCCA resected in 1999.  He has not had a recurrence since  that time, but was found at the time of office cystoscopy to have a  recurrent bladder tumor.  He was brought to the OR today for surgical  resection understanding the risks, complications, alternatives, and  limitations.   DESCRIPTION OF OPERATION:  After informed consent, the patient was brought  to major OR, placed on the table, administered general anesthesia, and moved  with the dorsal lithotomy position.  The genitalia was sterilely prepped and  draped, and the meatus was somewhat narrow.  I used a 24-French resectoscope  sheath, had a little difficulty negotiating the meatus, so I dilated with  Leander Rams sounds to 26-French.  I then passed a 24-French resectoscope  sheath with obturator into the bladder and removed the obturator inserting  the 12-degree lens and fully inspected the bladder.  The tumor was noted on  the posterior wall just posterior and lateral to the left orifice.  It was a  single papillary tumor, and no other lesions were identified with the 12-  degree lens.  The 70-degree lens was used then with  the 21-French cystoscope  sheath, and I found no other lesions within the bladder.   Reinserting the resectoscope, I resected the tumor into the wall of the  detrusor.  I then used fulguration to fulgurate the bed of the tumor as well  as the surrounding mucosa.  No bleeding occurred.  There was no perforation.  The tumor was removed from the bladder and sent to pathology.  I then  drained the bladder and removed the resectoscope.  A B&O suppository was  administered per rectum, and the patient was awakened and taken to the  recovery room in stable, satisfactory condition.  He tolerated the procedure  well with no intraoperative complications.   He will be given a prescription for 24 200 mg Pyridium and 24 Vicodin-ES and  follow up in my office in two weeks.  Mark C. Karsten Ro, M.D.    MCO/MEDQ  D:  05/28/2004  T:  05/28/2004  Job:  [id number]

## 2011-12-02 ENCOUNTER — Other Ambulatory Visit: Payer: Self-pay | Admitting: Dermatology

## 2011-12-02 DIAGNOSIS — D239 Other benign neoplasm of skin, unspecified: Secondary | ICD-10-CM | POA: Diagnosis not present

## 2011-12-02 DIAGNOSIS — L821 Other seborrheic keratosis: Secondary | ICD-10-CM | POA: Diagnosis not present

## 2011-12-02 DIAGNOSIS — Z85828 Personal history of other malignant neoplasm of skin: Secondary | ICD-10-CM | POA: Diagnosis not present

## 2011-12-02 DIAGNOSIS — C44711 Basal cell carcinoma of skin of unspecified lower limb, including hip: Secondary | ICD-10-CM | POA: Diagnosis not present

## 2011-12-02 DIAGNOSIS — D485 Neoplasm of uncertain behavior of skin: Secondary | ICD-10-CM | POA: Diagnosis not present

## 2011-12-02 DIAGNOSIS — I781 Nevus, non-neoplastic: Secondary | ICD-10-CM | POA: Diagnosis not present

## 2011-12-20 DIAGNOSIS — C44721 Squamous cell carcinoma of skin of unspecified lower limb, including hip: Secondary | ICD-10-CM | POA: Diagnosis not present

## 2011-12-20 DIAGNOSIS — C44711 Basal cell carcinoma of skin of unspecified lower limb, including hip: Secondary | ICD-10-CM | POA: Diagnosis not present

## 2012-01-20 ENCOUNTER — Ambulatory Visit (INDEPENDENT_AMBULATORY_CARE_PROVIDER_SITE_OTHER)
Admission: RE | Admit: 2012-01-20 | Discharge: 2012-01-20 | Disposition: A | Discharge status: Home / Self Care | Payer: Medicare Other | Source: Ambulatory Visit | Attending: Pulmonary Disease | Admitting: Pulmonary Disease

## 2012-01-20 ENCOUNTER — Ambulatory Visit (INDEPENDENT_AMBULATORY_CARE_PROVIDER_SITE_OTHER): Payer: Medicare Other | Admitting: Pulmonary Disease

## 2012-01-20 ENCOUNTER — Other Ambulatory Visit (INDEPENDENT_AMBULATORY_CARE_PROVIDER_SITE_OTHER): Payer: Medicare Other

## 2012-01-20 ENCOUNTER — Encounter: Payer: Self-pay | Admitting: Pulmonary Disease

## 2012-01-20 DIAGNOSIS — I10 Essential (primary) hypertension: Secondary | ICD-10-CM

## 2012-01-20 DIAGNOSIS — Z0000 Encounter for general adult medical examination without abnormal findings: Secondary | ICD-10-CM | POA: Diagnosis not present

## 2012-01-20 DIAGNOSIS — E7800 Pure hypercholesterolemia, unspecified: Secondary | ICD-10-CM

## 2012-01-20 DIAGNOSIS — Z125 Encounter for screening for malignant neoplasm of prostate: Secondary | ICD-10-CM

## 2012-01-20 DIAGNOSIS — E663 Overweight: Secondary | ICD-10-CM

## 2012-01-20 DIAGNOSIS — K5730 Diverticulosis of large intestine without perforation or abscess without bleeding: Secondary | ICD-10-CM

## 2012-01-20 DIAGNOSIS — Z87898 Personal history of other specified conditions: Secondary | ICD-10-CM

## 2012-01-20 DIAGNOSIS — D126 Benign neoplasm of colon, unspecified: Secondary | ICD-10-CM

## 2012-01-20 DIAGNOSIS — M1990 Unspecified osteoarthritis, unspecified site: Secondary | ICD-10-CM

## 2012-01-20 DIAGNOSIS — J42 Unspecified chronic bronchitis: Secondary | ICD-10-CM

## 2012-01-20 DIAGNOSIS — I872 Venous insufficiency (chronic) (peripheral): Secondary | ICD-10-CM

## 2012-01-20 DIAGNOSIS — C679 Malignant neoplasm of bladder, unspecified: Secondary | ICD-10-CM

## 2012-01-20 LAB — BASIC METABOLIC PANEL
Calcium: 9.3 mg/dL (ref 8.4–10.5)
Creatinine, Ser: 1.1 mg/dL (ref 0.4–1.5)

## 2012-01-20 LAB — HEPATIC FUNCTION PANEL
Bilirubin, Direct: 0.2 mg/dL (ref 0.0–0.3)
Total Bilirubin: 0.7 mg/dL (ref 0.3–1.2)

## 2012-01-20 LAB — CBC WITH DIFFERENTIAL/PLATELET
Basophils Relative: 0.4 % (ref 0.0–3.0)
Eosinophils Absolute: 0.1 10*3/uL (ref 0.0–0.7)
Eosinophils Relative: 2 % (ref 0.0–5.0)
Hemoglobin: 16.2 g/dL (ref 13.0–17.0)
Lymphocytes Relative: 24.2 % (ref 12.0–46.0)
MCHC: 34.3 g/dL (ref 30.0–36.0)
MCV: 92.6 fl (ref 78.0–100.0)
Neutro Abs: 4.6 10*3/uL (ref 1.4–7.7)
RBC: 5.1 Mil/uL (ref 4.22–5.81)
WBC: 7 10*3/uL (ref 4.5–10.5)

## 2012-01-20 LAB — LIPID PANEL
HDL: 56.3 mg/dL (ref >39.00)
LDL Cholesterol: 90 mg/dL (ref 0–99)
Total CHOL/HDL Ratio: 3
Triglycerides: 79 mg/dL (ref 0.0–149.0)
VLDL: 15.8 mg/dL (ref 0.0–40.0)

## 2012-01-20 NOTE — Patient Instructions (Signed)
Today we updated your med list in our EPIC system...    Continue your current medications the same...  Add MUCINEX OTC 2 tabs twice daily w/ lots of watyer for the congestion...  Today we did your follow up CXR & fasting blood work...    Please call the PHONE TREE in a few days for your results...    Dial [id number] & when prompted enter your patient number followed by the # symbol...    Your patient number is:  [id number]  Let's get on track w/ our diet & exercise program...    Try to lose 10-15 lbs...  Call for any questions...  Let's continue our yearly check ups but call anytime as needed for problems.Marland KitchenMarland Kitchen

## 2012-01-20 NOTE — Progress Notes (Signed)
Subjective:     Patient ID: Jeremy [name], male   DOB: Oct 07, 1941, 71 y.o.   MRN: [id number]  HPI 71 y/o WM here for a follow up visit... he has multiple medical problems as noted below...    ~  January 15, 2010:  he has been doing well- no new complaints or concerns... plays golf regularly- walking & exercising legs/ knees... still smokes an occas cig & he is encouraged to discontinue all smoking... BP controlled on meds;  Chol at goals w/ Simva40;  GI & GU stable- see below...  refills today + TDAP.  ~  January 14, 2011:  Yearly ROV- doing well, no new complaints or concerns, & no interval problems>>>    Continues to smoke on occas> denies cough, sputum, SOB, & he remains active w/ walking & golf;  we discussed this again...    BP controlled on 3 meds ==> 126/80    Cholesterol stable on Simva40 ==> TChol 155, TG 55, HDL 54, LDL 90    GI (divertics, polyps,hems) stable & colon f/u due 2013 per DrDBrodie...    GU (BPH, hx of Bladder Ca) followed by DrOttelin yearly & rov due 4/12, PSA=3.98...    Hx DJD & Vit D defic> followed by DrAlusio & he's taking 2000u VitD supplement...  ~  January 20, 2012:  Yearly ROV> Jeremy [name] reports doing well overall, no new complaints or concerns;  He checks w/ Derm- DrGruber every 27mo & had 2 basal cell cancers removed...    Ex-cig smoker> he tells me he quit regular cigs >42yr ago & finally quit the E-cig 14mo ago; admits to an occas cigar; denies cough, sputum, SOB, & he remains active w/ walking & golf.    HBP> on MetopXL50, Norvasc10, Lisinopril20; BP= 124/80 & similar at home, rare sharp CP, no palpit, denies SOB or edema...    Cholesterol> on Simva40; FLP showed TChol 162, TG 79, HDL 56, LDL 90    GI (divertics, polyps,hems) stable & colon f/u due Sept2013 per DrDBrodie...    GU (BPH, hx of Bladder Ca) followed by DrOttelin yearly (due 4/13); PSA=3.87 today...    Hx DJD & Vit D defic> followed by DrAlusio & he's taking 2000u VitD supplement... 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 ~  3/13: he tells me he quit regular cigs >7yr ago & finally quit the E-cig 73mo ago; admits to an occas cigar; denies cough, sputum, SOB, & he remains active w/ walking & golf...  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 uses Mucinex as needed. 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 ~  CXR 3/11 showed some scarring at the bases, no change... ~  CXR 3/12 showed chr changes, incr markings, basilar scarring, NAD. ~  CXR 3/13 showed normal heart size, COPD w/ hyperinflation, clear/ NAD...  HYPERTENSION (ICD-401.9) - on METOPROLOL XR 50mg /d,  AMLODIPINE 10mg /d,  LISINOPRIL 20mg /d...  he also takes ASA 81mg /d... he has seen DrWall in the past... BP= 124/80 & similar at home, takes meds regularly & tolerates well... denies HA, fatigue, visual changes, CP, palipit, dizziness, syncope, dyspnea, edema, etc...  ~  NuclearStressTest 8/07 showed a hypertensive response but no scar or ischemia, EF= 63%... no change from 2002 study... Toprol added...  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 he knows to elim salt, elevate, wear support hose, etc...  HYPERCHOLESTEROLEMIA (ICD-272.0) - on SIMVASTATIN 40mg /d...  ~  [place] 7/08 showed TChol 144, TG 64, HDL 41, LDL 90...  ~  [place] 12/09 showed TChol 173, TG 82, HDL 51, LDL 106... rec- same med + better diet. ~  FLP 3/11 showed TChol 158, TG 116, HDL 54, LDL 81 ~  FLP 3/12 on Simva40 showed TChol 155, TG 55, HDL 54, LDL 90 ~  FLP 3/13 on Simva40 showed TChol 162, TG 79, HDL 56, LDL 90  OVERWEIGHT (ICD-278.02) -  ~  weight 11/09 = 212# since he cut way back on smoking... we discussed diet  + exercise etc... ~  weight 3/11 = 207# ~  weight 3/12 = 211# ~  Weight 3/13 = 216#  DIVERTICULOSIS OF COLON (ICD-562.10),  COLONIC POLYPS (ICD-211.3),  & HEMORRHOIDS (ICD-455.6) -  - last colonoscopy 9/08 by DrDBrodie showed several 3-67mm polyps & divertics... path= adenomatous and f/u planned in 5 yrs...  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 he gets cystoscopies every 4-6 months now...BPH w/ mild obstructive symptoms... intermittently elevated PSA on testing in the past... ~  labs 3/11 showed PSA = 3.64 ~  labs 3/12 showed PSA= 3.98 ~  Labs 3/13 showed PSA= 3.87  DEGENERATIVE JOINT DISEASE (ICD-715.90) - eval for left knee pain by DrAlusio w/ torn cartilage & baker's cyst... knee tapped, given shot w/ relief...  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 ~  labs 11/09 showed Vit D level = 22... rec to start 2000 u daily. ~  labs 3/11 showed Vit D level = 50... continue supplemental Vit D at 1000 u daily.  Health Maintenance: ~  Immunizations:  he gets the seasonal flu vaccine yearly;  had the PNEUMOVAX 2009 (age66);  OK TDAP today 3/11...   Past Surgical History  Procedure Date  . Tonsillectomy and adenoidectomy     Surg as a child...  . Skin cancer excision     Surg x2...    Outpatient Encounter Prescriptions as of 01/20/2012  Medication Sig Dispense Refill  . amLODipine (NORVASC) 10 MG tablet Take 1 tablet by mouth daily.      Marland Kitchen aspirin 81 MG tablet 2 tablets daily      . cholecalciferol (VITAMIN D) 1000 UNITS tablet Take 1,000 Units by mouth daily.      Marland Kitchen lisinopril (PRINIVIL,ZESTRIL) 20 MG tablet Take 1 tablet by mouth daily.      . metoprolol succinate (TOPROL-XL) 50 MG  24 hr tablet Take 1 tablet by mouth daily.      . Multiple Vitamin (MULTIVITAMIN) capsule Take 1 capsule by mouth daily.      . simvastatin (ZOCOR) 40 MG tablet Take 1 tablet by mouth daily.        Allergies  Allergen Reactions  . Penicillins     REACTION: hives severe as a child    Current Medications, Allergies, Past Medical History, Past Surgical History, Family History, and Social History were reviewed in  [facility] electronic medical record.    Review of Systems    Constitutional:  Denies F/C/S, anorexia, unexpected weight change. HEENT:  No HA, visual changes, earache, nasal symptoms, sore throat, hoarseness. Resp:  No cough, sputum, hemoptysis; no SOB, tightness, wheezing. Cardio:  No CP, palpit, DOE, orthopnea, edema. GI:  Denies N/V/D/C or blood in stool; no reflux, abd pain, distention, or gas. GU:  No dysuria, freq, urgency, hematuria, or flank pain. MS:  Denies joint pain, swelling, tenderness, or decr ROM; no neck pain, back pain, etc. Neuro:  No tremors, seizures, dizziness, syncope, weakness, numbness, gait abn. Skin:  No suspicious lesions or skin rash. Heme:  No adenopathy, bruising, bleeding. Psyche: Denies confusion, sleep disturbance, hallucinations, anxiety, depression.   Objective:   Physical Exam    WD, Overweight, 71 y/o WM in NAD... GENERAL:  Alert & oriented; pleasant & cooperative... HEENT:  [place]/AT, EOM-wnl, PERRLA, EACs-clear, TMs-wnl, NOSE-clear, THROAT-clear & wnl. NECK:  Supple w/ fairROM; no JVD; normal carotid impulses w/o bruits; no thyromegaly or nodules palpated; no lymphadenopathy. CHEST:  Clear to P & A; without wheezes/ rales/ or rhonchi heard... HEART:  Regular Rhythm; without murmurs/ rubs/ or gallops detected... ABDOMEN:  Soft & nontender; normal bowel sounds; no organomegaly or masses palpated... EXT: without deformities, mild arthritic changes; no varicose veins, +ven insuffic, tr edema... NEURO:  CN's intact; motor testing  normal; sensory testing normal; gait normal & balance OK. DERM:  No lesions noted; no rash etc...  RADIOLOGY DATA:  Reviewed in the EPIC EMR & discussed w/ the patient...    >>CXR 3/13 showed normal heart size, COPD w/ hyperinflation, clear/ NAD...    >>EKG 3/13 showed NSR, rate66, ?LAE, otherw WNL.Marland KitchenMarland Kitchen  LABORATORY DATA:  Reviewed in the EPIC EMR & discussed w/ the patient...    >>LABS 3/13:  FLP- at goals on Simva40;  Chems- wnl w/ BS110;  CBC- wnl;  TSH=0.88;  PSA=3.87   Assessment:     COPD/ Ex-smoker>  CXR w/ some hyperinflation but he is asymptomatic, denies cough/ SOB/ etc; needs to incr activity/ exercise & stay off the cigs...  HBP>  Controlled on his 3 meds, continue same...  CHOL>  FLP is at the goals on Simva40 + diet...  OVERWEIGHT>  We reviewed diet & exercise program...  GI> Divertics, Polyps>  He is up to date w/ f/u due 9/13...  Gallstones>  Asymptomatic stones seen on prev scan...  BPH>  Followed by DrOttelin & appt set up 4/13 per pt...  DJD>  Aware, he uses OTC meds as needed...  Other medical issues as noted...     Plan:     Patient's Medications  New Prescriptions   No medications on file  Previous Medications   AMLODIPINE (NORVASC) 10 MG TABLET    Take 1 tablet by mouth daily.   ASPIRIN 81 MG TABLET    2 tablets daily   CHOLECALCIFEROL (VITAMIN D) 1000 UNITS TABLET    Take 1,000 Units by mouth daily.   LISINOPRIL (PRINIVIL,ZESTRIL) 20 MG TABLET    Take 1 tablet by mouth daily.   METOPROLOL SUCCINATE (TOPROL-XL) 50 MG 24 HR TABLET    Take 1 tablet by mouth daily.   MULTIPLE VITAMIN (MULTIVITAMIN) CAPSULE    Take 1 capsule by mouth daily.   SIMVASTATIN (ZOCOR) 40 MG TABLET    Take 1 tablet by mouth daily.  Modified Medications   No medications on file  Discontinued Medications   No medications on file

## 2012-01-24 ENCOUNTER — Encounter: Payer: Self-pay | Admitting: Pulmonary Disease

## 2012-02-14 ENCOUNTER — Other Ambulatory Visit: Payer: Self-pay | Admitting: *Deleted

## 2012-02-14 MED ORDER — LISINOPRIL 20 MG PO TABS: 20.0000 mg | ORAL_TABLET | Freq: Every day | ORAL | Status: DC

## 2012-02-14 MED ORDER — SIMVASTATIN 40 MG PO TABS: 40.0000 mg | ORAL_TABLET | Freq: Every day | ORAL | Status: DC

## 2012-02-14 MED ORDER — AMLODIPINE BESYLATE 10 MG PO TABS: 10.0000 mg | ORAL_TABLET | Freq: Every day | ORAL | Status: DC

## 2012-02-14 MED ORDER — METOPROLOL SUCCINATE ER 50 MG PO TB24: 50.0000 mg | ORAL_TABLET | Freq: Every day | ORAL | Status: DC

## 2012-03-16 DIAGNOSIS — Z961 Presence of intraocular lens: Secondary | ICD-10-CM | POA: Diagnosis not present

## 2012-07-16 ENCOUNTER — Encounter: Payer: Self-pay | Admitting: Internal Medicine

## 2012-07-30 DIAGNOSIS — N401 Enlarged prostate with lower urinary tract symptoms: Secondary | ICD-10-CM | POA: Diagnosis not present

## 2012-07-30 DIAGNOSIS — R351 Nocturia: Secondary | ICD-10-CM | POA: Diagnosis not present

## 2012-07-30 DIAGNOSIS — R82998 Other abnormal findings in urine: Secondary | ICD-10-CM | POA: Diagnosis not present

## 2012-08-03 ENCOUNTER — Encounter: Payer: Self-pay | Admitting: Internal Medicine

## 2012-08-31 DIAGNOSIS — Z23 Encounter for immunization: Secondary | ICD-10-CM | POA: Diagnosis not present

## 2012-09-11 ENCOUNTER — Ambulatory Visit (AMBULATORY_SURGERY_CENTER): Payer: Medicare Other | Admitting: *Deleted

## 2012-09-11 VITALS — Ht 71.5 in | Wt 207.7 lb

## 2012-09-11 DIAGNOSIS — Z1211 Encounter for screening for malignant neoplasm of colon: Secondary | ICD-10-CM

## 2012-09-11 MED ORDER — MOVIPREP 100 G PO SOLR: ORAL | Status: DC

## 2012-09-25 ENCOUNTER — Ambulatory Visit (AMBULATORY_SURGERY_CENTER): Payer: Medicare Other | Admitting: Internal Medicine

## 2012-09-25 ENCOUNTER — Encounter: Payer: Self-pay | Admitting: Internal Medicine

## 2012-09-25 VITALS — BP 146/69 | HR 59 | Temp 97.0°F | Resp 39 | Ht 71.0 in | Wt 207.0 lb

## 2012-09-25 DIAGNOSIS — I10 Essential (primary) hypertension: Secondary | ICD-10-CM | POA: Diagnosis not present

## 2012-09-25 DIAGNOSIS — Z1211 Encounter for screening for malignant neoplasm of colon: Secondary | ICD-10-CM

## 2012-09-25 DIAGNOSIS — E663 Overweight: Secondary | ICD-10-CM | POA: Diagnosis not present

## 2012-09-25 DIAGNOSIS — N400 Enlarged prostate without lower urinary tract symptoms: Secondary | ICD-10-CM | POA: Diagnosis not present

## 2012-09-25 DIAGNOSIS — D126 Benign neoplasm of colon, unspecified: Secondary | ICD-10-CM | POA: Diagnosis not present

## 2012-09-25 DIAGNOSIS — Z86010 Personal history of colonic polyps: Secondary | ICD-10-CM

## 2012-09-25 DIAGNOSIS — E785 Hyperlipidemia, unspecified: Secondary | ICD-10-CM | POA: Diagnosis not present

## 2012-09-25 DIAGNOSIS — K5730 Diverticulosis of large intestine without perforation or abscess without bleeding: Secondary | ICD-10-CM | POA: Diagnosis not present

## 2012-09-25 DIAGNOSIS — K649 Unspecified hemorrhoids: Secondary | ICD-10-CM | POA: Diagnosis not present

## 2012-09-25 MED ORDER — SODIUM CHLORIDE 0.9 % IV SOLN: 500.0000 mL | INTRAVENOUS | Status: DC

## 2012-09-25 NOTE — Patient Instructions (Addendum)

## 2012-09-25 NOTE — Op Note (Signed)
[facility]  [address]. [place], [address]   COLONOSCOPY PROCEDURE REPORT  PATIENT: Jeremy [name], Jeremy [name]  MR#: [id number] BIRTHDATE: 1941/04/11 , 71  yrs. old GENDER: Male ENDOSCOPIST: Lafayette Dragon, MD REFERRED BY:  recall colonoscopy PROCEDURE DATE:  09/25/2012 PROCEDURE:   Colonoscopy with cold biopsy polypectomy and Colonoscopy with snare polypectomy ASA CLASS:   Class III INDICATIONS:patient's personal history of adenomatous colon polyps and prior colon 2000- aden.  polyp, 2005, 2008- multiple polyps,adenomatous and hyperplastic. MEDICATIONS: MAC sedation, administered by CRNA and propofol (Diprivan) 250mg  IV  DESCRIPTION OF PROCEDURE:   After the risks and benefits and of the procedure were explained, informed consent was obtained.  A digital rectal exam revealed no abnormalities of the rectum.    The LB [id number]  endoscope was introduced through the anus and advanced to the cecum, which was identified by both the appendix and ileocecal valve .  The quality of the prep was good, using MoviPrep .  The instrument was then slowly withdrawn as the colon was fully examined.     COLON FINDINGS: Two smooth sessile polyps ranging between 3-39mm in size were found in the descending colon.  A polypectomy was performed with a cold snare and with cold forceps.  The resection was complete and the polyp tissue was completely retrieved. Moderate diverticulosis was noted in the descending colon. Retroflexed views revealed no abnormalities.     The scope was then withdrawn from the patient and the procedure completed.  COMPLICATIONS: There were no complications. ENDOSCOPIC IMPRESSION: 1.   Two sessile polyps ranging between 3-30mm in size were found in the descending colon; polypectomy was performed with a cold snare and with cold forceps 2.   Moderate diverticulosis was noted in the descending colon  RECOMMENDATIONS: 1.  Await pathology results 2.  High  fiber diet   REPEAT EXAM: In 5-10 year(s)  for Colonoscopy depending on pt's health status and polyp type  cc:  _______________________________ eSignedLafayette Dragon, MD 09/25/2012 8:55 AM     PATIENT NAME:  Jeremy [name], Jeremy [name] MR#: [id number]

## 2012-09-25 NOTE — Progress Notes (Signed)
Patient did not experience any of the following events: a burn prior to discharge; a fall within the facility; wrong site/side/patient/procedure/implant event; or a hospital transfer or hospital admission upon discharge from the facility. (G8907) Patient did not have preoperative order for IV antibiotic SSI prophylaxis. (G8918)  

## 2012-09-28 ENCOUNTER — Telehealth: Payer: Self-pay

## 2012-09-28 NOTE — Telephone Encounter (Signed)
  Follow up Call-  Call back number 09/25/2012  Post procedure Call Back phone  # [phone]  Permission to leave phone message Yes     Patient questions:  Do you have a fever, pain , or abdominal swelling? no Pain Score  0 *  Have you tolerated food without any problems? yes  Have you been able to return to your normal activities? yes  Do you have any questions about your discharge instructions: Diet   no Medications  no Follow up visit  no  Do you have questions or concerns about your Care? no  Actions: * If pain score is 4 or above: No action needed, pain <4.

## 2012-09-29 ENCOUNTER — Encounter: Payer: Self-pay | Admitting: Internal Medicine

## 2012-11-13 DIAGNOSIS — N401 Enlarged prostate with lower urinary tract symptoms: Secondary | ICD-10-CM | POA: Diagnosis not present

## 2012-11-20 DIAGNOSIS — Z8551 Personal history of malignant neoplasm of bladder: Secondary | ICD-10-CM | POA: Diagnosis not present

## 2012-11-20 DIAGNOSIS — N390 Urinary tract infection, site not specified: Secondary | ICD-10-CM | POA: Diagnosis not present

## 2012-11-20 DIAGNOSIS — R339 Retention of urine, unspecified: Secondary | ICD-10-CM | POA: Diagnosis not present

## 2012-11-20 DIAGNOSIS — N401 Enlarged prostate with lower urinary tract symptoms: Secondary | ICD-10-CM | POA: Diagnosis not present

## 2012-12-01 DIAGNOSIS — N529 Male erectile dysfunction, unspecified: Secondary | ICD-10-CM | POA: Diagnosis not present

## 2012-12-01 DIAGNOSIS — N401 Enlarged prostate with lower urinary tract symptoms: Secondary | ICD-10-CM | POA: Diagnosis not present

## 2012-12-01 DIAGNOSIS — Z8551 Personal history of malignant neoplasm of bladder: Secondary | ICD-10-CM | POA: Diagnosis not present

## 2012-12-01 DIAGNOSIS — R339 Retention of urine, unspecified: Secondary | ICD-10-CM | POA: Diagnosis not present

## 2012-12-07 ENCOUNTER — Other Ambulatory Visit: Payer: Self-pay | Admitting: Dermatology

## 2012-12-07 DIAGNOSIS — L570 Actinic keratosis: Secondary | ICD-10-CM | POA: Diagnosis not present

## 2012-12-07 DIAGNOSIS — L820 Inflamed seborrheic keratosis: Secondary | ICD-10-CM | POA: Diagnosis not present

## 2012-12-07 DIAGNOSIS — L821 Other seborrheic keratosis: Secondary | ICD-10-CM | POA: Diagnosis not present

## 2012-12-07 DIAGNOSIS — D239 Other benign neoplasm of skin, unspecified: Secondary | ICD-10-CM | POA: Diagnosis not present

## 2012-12-07 DIAGNOSIS — D485 Neoplasm of uncertain behavior of skin: Secondary | ICD-10-CM | POA: Diagnosis not present

## 2012-12-07 DIAGNOSIS — Z85828 Personal history of other malignant neoplasm of skin: Secondary | ICD-10-CM | POA: Diagnosis not present

## 2013-01-25 ENCOUNTER — Other Ambulatory Visit (INDEPENDENT_AMBULATORY_CARE_PROVIDER_SITE_OTHER): Payer: Medicare Other

## 2013-01-25 ENCOUNTER — Encounter: Payer: Self-pay | Admitting: Pulmonary Disease

## 2013-01-25 ENCOUNTER — Ambulatory Visit (INDEPENDENT_AMBULATORY_CARE_PROVIDER_SITE_OTHER): Payer: Medicare Other | Admitting: Pulmonary Disease

## 2013-01-25 ENCOUNTER — Ambulatory Visit (INDEPENDENT_AMBULATORY_CARE_PROVIDER_SITE_OTHER)
Admission: RE | Admit: 2013-01-25 | Discharge: 2013-01-25 | Disposition: A | Discharge status: Home / Self Care | Payer: Medicare Other | Source: Ambulatory Visit | Attending: Pulmonary Disease | Admitting: Pulmonary Disease

## 2013-01-25 VITALS — BP 128/68 | HR 73 | Temp 97.7°F | Ht 70.0 in | Wt 211.4 lb

## 2013-01-25 DIAGNOSIS — J42 Unspecified chronic bronchitis: Secondary | ICD-10-CM

## 2013-01-25 DIAGNOSIS — K5730 Diverticulosis of large intestine without perforation or abscess without bleeding: Secondary | ICD-10-CM | POA: Diagnosis not present

## 2013-01-25 DIAGNOSIS — E559 Vitamin D deficiency, unspecified: Secondary | ICD-10-CM

## 2013-01-25 DIAGNOSIS — I10 Essential (primary) hypertension: Secondary | ICD-10-CM | POA: Diagnosis not present

## 2013-01-25 DIAGNOSIS — C679 Malignant neoplasm of bladder, unspecified: Secondary | ICD-10-CM

## 2013-01-25 DIAGNOSIS — E7800 Pure hypercholesterolemia, unspecified: Secondary | ICD-10-CM

## 2013-01-25 DIAGNOSIS — M1990 Unspecified osteoarthritis, unspecified site: Secondary | ICD-10-CM

## 2013-01-25 DIAGNOSIS — F411 Generalized anxiety disorder: Secondary | ICD-10-CM

## 2013-01-25 DIAGNOSIS — D126 Benign neoplasm of colon, unspecified: Secondary | ICD-10-CM

## 2013-01-25 DIAGNOSIS — I872 Venous insufficiency (chronic) (peripheral): Secondary | ICD-10-CM

## 2013-01-25 DIAGNOSIS — Z87898 Personal history of other specified conditions: Secondary | ICD-10-CM

## 2013-01-25 LAB — LIPID PANEL
Cholesterol: 143 mg/dL (ref 0–200)
HDL: 56.6 mg/dL (ref >39.00)

## 2013-01-25 LAB — CBC WITH DIFFERENTIAL/PLATELET
Basophils Absolute: 0 10*3/uL (ref 0.0–0.1)
Hemoglobin: 16.4 g/dL (ref 13.0–17.0)
Lymphocytes Relative: 22 % (ref 12.0–46.0)
Monocytes Relative: 8.7 % (ref 3.0–12.0)
Platelets: 249 10*3/uL (ref 150.0–400.0)
RDW: 13.6 % (ref 11.5–14.6)

## 2013-01-25 LAB — HEPATIC FUNCTION PANEL
AST: 16 U/L (ref 0–37)
Albumin: 4.2 g/dL (ref 3.5–5.2)
Alkaline Phosphatase: 55 U/L (ref 39–117)
Total Protein: 7.8 g/dL (ref 6.0–8.3)

## 2013-01-25 LAB — BASIC METABOLIC PANEL
BUN: 25 mg/dL — ABNORMAL HIGH (ref 6–23)
CO2: 28 mEq/L (ref 19–32)
Calcium: 9.1 mg/dL (ref 8.4–10.5)
Glucose, Bld: 128 mg/dL — ABNORMAL HIGH (ref 70–99)
Sodium: 136 mEq/L (ref 135–145)

## 2013-01-25 LAB — TSH: TSH: 0.75 u[IU]/mL (ref 0.35–5.50)

## 2013-01-25 MED ORDER — SIMVASTATIN 40 MG PO TABS: 40.0000 mg | ORAL_TABLET | Freq: Every day | ORAL | Status: DC

## 2013-01-25 MED ORDER — METOPROLOL SUCCINATE ER 50 MG PO TB24: 50.0000 mg | ORAL_TABLET | Freq: Every day | ORAL | Status: DC

## 2013-01-25 MED ORDER — AMLODIPINE BESYLATE 10 MG PO TABS: 10.0000 mg | ORAL_TABLET | Freq: Every day | ORAL | Status: DC

## 2013-01-25 MED ORDER — LISINOPRIL 20 MG PO TABS: 20.0000 mg | ORAL_TABLET | Freq: Every day | ORAL | Status: DC

## 2013-01-25 NOTE — Patient Instructions (Addendum)
Today we updated your med list in our EPIC system...    Continue your current medications the same...    We refilled your meds per request...  Today we did your f/u CXR & FASTING blood work...    We will contact you w/ the results when available...   Stay as active as poss, & keep up the good work w/ diet...  Call for any problems...  Let's plan a follow up visit in 1 yr, sooner if needed for any reason.Marland KitchenMarland Kitchen

## 2013-01-25 NOTE — Progress Notes (Signed)
Subjective:     Patient ID: Jeremy [name]. Gallier, male   DOB: [birth year], 72 y.o.   MRN: [id number]  HPI 72 y/o WM here for a follow up visit... he has multiple medical problems as noted below...    ~  January 15, 2010:  he has been doing well- no new complaints or concerns... plays golf regularly- walking & exercising legs/ knees... still smokes an occas cig & he is encouraged to discontinue all smoking... BP controlled on meds;  Chol at goals w/ Simva40;  GI & GU stable- see below...  refills today + TDAP.  ~  January 14, 2011:  Yearly ROV- doing well, no new complaints or concerns, & no interval problems>>>    Continues to smoke on occas> denies cough, sputum, SOB, & he remains active w/ walking & golf;  we discussed this again...    BP controlled on 3 meds ==> 126/80    Cholesterol stable on Simva40 ==> TChol 155, TG 55, HDL 54, LDL 90    GI (divertics, polyps,hems) stable & colon f/u due 2013 per DrDBrodie...    GU (BPH, hx of Bladder Ca) followed by DrOttelin yearly & rov due 4/12, PSA=3.98...    Hx DJD & Vit D defic> followed by DrAlusio & he's taking 2000u VitD supplement...  ~  January 20, 2012:  Yearly ROV> Cevin reports doing well overall, no new complaints or concerns;  He checks w/ Derm- DrGruber every 52mo & had 2 basal cell cancers removed...    Ex-cig smoker> he tells me he quit regular cigs >39yr ago & finally quit the E-cig 42mo ago; admits to an occas cigar; denies cough, sputum, SOB, & he remains active w/ walking & golf.    HBP> on MetopXL50, Norvasc10, Lisinopril20; BP= 124/80 & similar at home, rare sharp CP, no palpit, denies SOB or edema...    Cholesterol> on Simva40; FLP showed TChol 162, TG 79, HDL 56, LDL 90    GI (divertics, polyps,hems) stable & colon f/u due Sept2013 per DrDBrodie...    GU (BPH, hx of Bladder Ca) followed by DrOttelin yearly (due 4/13); PSA=3.87 today...    Hx DJD & Vit D defic> followed by DrAlusio & he's taking 2000u VitD supplement... CXR 3/13 showed normal  heart size, COPD w/ hyperinflation, clear/ NAD.Marland KitchenMarland Kitchen EKG 3/13 showed NSR, rate66, ?LAE, otherw WNL.Marland KitchenMarland Kitchen LABS 3/13:  FLP- at goals on Simva40;  Chems- wnl w/ BS110;  CBC- wnl;  TSH=0.88;  PSA=3.87  ~  January 25, 2013:  Yearly ROV & Clarnece has had a good yr except for some recurrent UTIs which prompted a Urology f/u w/ DrOttelin & Borden> eval revealed worsening BPH/ LUTS and PVR>600cc; started on Proscar & Flomax w/ improvement (voiding better, decr nocturia, stream improved)... We reviewed the following medical problems during today's office visit >>     Ex-cig smoker> he quit regular cigs >73yrs ago & uses occas E-cig/ cigar; denies cough, sputum, SOB, & he remains active w/ walking & golfs regularly...    HBP> on MetopXL50, Norvasc10, Lisinopril20; BP= 128/68 & similar at home; denies CP/angina, palpit, SOB or edema...    Cholesterol> on Simva40; FLP 3/14 shows TChol 143, TG 44, HDL 57, LDL 78; continue same...    GI (divertics, polyps,hems) stable & f/u colon done 11/13 by DrDBrodie- mod divertics, 2 sessile polyps w/ path revealing benign mucosa & f/u rec 31yrs.    GU- hx of Bladder Ca, BPH, UTIs, ED>  followed by DrOttelin/Borden &  seen 1/14 w/ recurrent UTIs, signif PVR>600, & treated w/ Proscar5 & Flomax0.4 w/ sigif improvement in symptoms; cysto was otherw neg & PSA= 4.46 w/ 30%free...    Hx DJD & Vit D defic> followed by DrAlusio & he's taking 1000u VitD supplement... We reviewed prob list, meds, xrays and labs> see below for updates >>  CXR 3/14 showed norm heart size, clear lungs w/ min basilar scarring, NAD... LABS 3/14:  FLP- at goals on Simva40 (contin same);  Chems- ok x BS=128 (needs better diet to avoid DM meds);  CBC- wnl;  TSH=0.75...           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 ~  3/13: he tells me he quit regular cigs >73yr ago & finally quit the E-cig 46mo ago; admits to an occas cigar;  denies cough, sputum, SOB, & he remains active w/ walking & golf...  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 uses Mucinex as needed. 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 ~  CXR 3/11 showed some scarring at the bases, no change... ~  CXR 3/12 showed chr changes, incr markings, basilar scarring, NAD. ~  CXR 3/13 showed normal heart size, COPD w/ hyperinflation, clear/ NAD.Marland Kitchen. ~  CXR 3/14 showed norm heart size, clear lungs w/ min basilar scarring, NAD.  HYPERTENSION (ICD-401.9) - on METOPROLOL XR 50mg /d,  AMLODIPINE 10mg /d,  LISINOPRIL 20mg /d...  he also takes ASA 81mg /d... he has seen DrWall in the past... BP= 124/80 & similar at home, takes meds regularly & tolerates well... denies HA, fatigue, visual changes, CP, palipit, dizziness, syncope, dyspnea, edema, etc...  ~  NuclearStressTest 8/07 showed a hypertensive response but no scar or ischemia, EF= 63%... no change from 2002 study... Toprol added...  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 he knows to elim salt, elevate, wear support hose, etc...  HYPERCHOLESTEROLEMIA (ICD-272.0) - on SIMVASTATIN 40mg /d...  ~  [place] 7/08 showed TChol 144, TG 64, HDL 41, LDL 90...  ~  [place] 12/09 showed TChol 173, TG 82, HDL 51, LDL 106... rec- same med + better diet. ~  FLP 3/11 showed TChol 158, TG 116, HDL 54, LDL 81 ~  FLP 3/12 on Simva40 showed TChol 155, TG 55, HDL 54, LDL 90 ~  FLP 3/13 on Simva40 showed TChol 162, TG 79, HDL 56, LDL 90 ~  FLP 3/14 on Simva40 showed TChol 143, TG 44, HDL 57, LDL 78  OVERWEIGHT (ICD-278.02) -  ~  weight 11/09 = 212# since he cut way back on smoking... we discussed diet + exercise etc... ~  weight 3/11 = 207# ~  weight 3/12 = 211# ~  Weight 3/13 = 216# ~  Weight 3/14 = 211#  DIVERTICULOSIS OF COLON (ICD-562.10),  COLONIC POLYPS (ICD-211.3),  & HEMORRHOIDS (ICD-455.6) -  - last colonoscopy 9/08 by DrDBrodie showed several 3-8mm polyps & divertics... path=  adenomatous and f/u planned in 5 yrs...  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  Repeated URINARY TRACT INFECTIONS >> had Enterococcus UTI 1/14 treated by Urology w/ Levaquin... 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 he gets cystoscopies less often now...BPH w/ mild obstructive  symptoms... intermittently elevated PSA on testing in the past... ~  labs 3/11 showed PSA = 3.64 ~  labs 3/12 showed PSA= 3.98 ~  Labs 3/13 showed PSA= 3.87 ~  Urology f/u DrBorden 1/14> Hx UTIs, Bladder Cancer, BPH/LTOS, & ED- on Proscar5 & Flomax0.4; they did cysto (neg) & PVR was 600cc, symptoms better on Rx now; they did PSA 1/14= 4.46 (30%free)...   DEGENERATIVE JOINT DISEASE (ICD-715.90) - eval for left knee pain by DrAlusio w/ torn cartilage & baker's cyst... knee tapped, given shot w/ relief...  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 ~  labs 11/09 showed Vit D level = 22... rec to start 2000 u daily. ~  labs 3/11 showed Vit D level = 50... continue supplemental Vit D at 1000 u daily.  Health Maintenance: ~  Immunizations:  he gets the seasonal flu vaccine yearly;  had the PNEUMOVAX 2009 (age66);   TDAP 3/11...   Past Surgical History  Procedure Laterality Date  . Tonsillectomy and adenoidectomy      Surg as a child...  . Skin cancer excision      Surg x2...    Outpatient Encounter Prescriptions as of 01/25/2013  Medication Sig Dispense Refill  . amLODipine (NORVASC) 10 MG tablet Take 1 tablet (10 mg total) by mouth daily.  90 tablet  3  . aspirin 81 MG tablet 2 tablets daily      . cholecalciferol (VITAMIN D) 1000 UNITS tablet Take 1,000 Units by mouth daily.      . finasteride (PROSCAR) 5 MG tablet Take 5 mg by mouth daily.      Marland Kitchen  lisinopril (PRINIVIL,ZESTRIL) 20 MG tablet Take 1 tablet (20 mg total) by mouth daily.  90 tablet  3  . metoprolol succinate (TOPROL-XL) 50 MG 24 hr tablet Take 1 tablet (50 mg total) by mouth daily.  90 tablet  3  . Multiple Vitamin (MULTIVITAMIN) capsule Take 1 capsule by mouth daily.      . simvastatin (ZOCOR) 40 MG tablet Take 1 tablet (40 mg total) by mouth daily.  90 tablet  3  . tamsulosin (FLOMAX) 0.4 MG CAPS Take 0.4 mg by mouth daily after supper.       No facility-administered encounter medications on file as of 01/25/2013.    Allergies  Allergen Reactions  . Sulfa Antibiotics     Rash, light headed, shaking  . Penicillins     REACTION: hives severe as a child    Current Medications, Allergies, Past Medical History, Past Surgical History, Family History, and Social History were reviewed in [employer] record.    Review of Systems    Constitutional:  Denies F/C/S, anorexia, unexpected weight change. HEENT:  No HA, visual changes, earache, nasal symptoms, sore throat, hoarseness. Resp:  No cough, sputum, hemoptysis; no SOB, tightness, wheezing. Cardio:  No CP, palpit, DOE, orthopnea, edema. GI:  Denies N/V/D/C or blood in stool; no reflux, abd pain, distention, or gas. GU:  No dysuria, freq, urgency, hematuria, or flank pain. MS:  Denies joint pain, swelling, tenderness, or decr ROM; no neck pain, back pain, etc. Neuro:  No tremors, seizures, dizziness, syncope, weakness, numbness, gait abn. Skin:  No suspicious lesions or skin rash. Heme:  No adenopathy, bruising, bleeding. Psyche: Denies confusion, sleep disturbance, hallucinations, anxiety, depression.   Objective:   Physical Exam    WD, Overweight, 72 y/o WM in NAD... GENERAL:  Alert & oriented; pleasant & cooperative... HEENT:  [place]/AT, EOM-wnl, PERRLA, EACs-clear, TMs-wnl, NOSE-clear, THROAT-clear & wnl. NECK:  Supple w/ fairROM; no JVD; normal carotid impulses w/o bruits; no thyromegaly or  nodules palpated; no lymphadenopathy. CHEST:  Clear to P & A; without wheezes/ rales/ or rhonchi heard... HEART:  Regular Rhythm; without murmurs/ rubs/ or gallops detected... ABDOMEN:  Soft & nontender; normal bowel sounds; no organomegaly or masses palpated... EXT: without deformities, mild arthritic changes; no varicose veins, +ven insuffic, tr edema... NEURO:  CN's intact; motor testing normal; sensory testing normal; gait normal & balance OK. DERM:  No lesions noted; no rash etc...  RADIOLOGY DATA:  Reviewed in the EPIC EMR & discussed w/ the patient...  LABORATORY DATA:  Reviewed in the EPIC EMR & discussed w/ the patient...     Assessment:      COPD/ Ex-smoker>  CXR w/ some hyperinflation but he is asymptomatic, denies cough/ SOB/ etc; needs to incr activity/ exercise & stay off the cigs...  HBP>  Controlled on his 3 meds, continue same...  CHOL>  FLP is at the goals on Simva40 + diet...  OVERWEIGHT>  We reviewed diet & exercise program...  GI> Divertics, Polyps>  He is up to date w/ f/u due 9/13...  Gallstones>  Asymptomatic stones seen on prev scan...  BPH>  Followed by DrOttelin/Borden & improved on Proscar/ Flomax...  DJD>  Aware, he uses OTC meds as needed...  Other medical issues as noted...     Plan:     Patient's Medications  New Prescriptions   No medications on file  Previous Medications   ASPIRIN 81 MG TABLET    2 tablets daily   CHOLECALCIFEROL (VITAMIN D) 1000 UNITS TABLET    Take 1,000 Units by mouth daily.   FINASTERIDE (PROSCAR) 5 MG TABLET    Take 5 mg by mouth daily.   MULTIPLE VITAMIN (MULTIVITAMIN) CAPSULE    Take 1 capsule by mouth daily.   TAMSULOSIN (FLOMAX) 0.4 MG CAPS    Take 0.4 mg by mouth daily after supper.  Modified Medications   Modified Medication Previous Medication   AMLODIPINE (NORVASC) 10 MG TABLET amLODipine (NORVASC) 10 MG tablet      Take 1 tablet (10 mg total) by mouth daily.    Take 1 tablet (10 mg total) by mouth daily.    LISINOPRIL (PRINIVIL,ZESTRIL) 20 MG TABLET lisinopril (PRINIVIL,ZESTRIL) 20 MG tablet      Take 1 tablet (20 mg total) by mouth daily.    Take 1 tablet (20 mg total) by mouth daily.   METOPROLOL SUCCINATE (TOPROL-XL) 50 MG 24 HR TABLET metoprolol succinate (TOPROL-XL) 50 MG 24 hr tablet      Take 1 tablet (50 mg total) by mouth daily.    Take 1 tablet (50 mg total) by mouth daily.   SIMVASTATIN (ZOCOR) 40 MG TABLET simvastatin (ZOCOR) 40 MG tablet      Take 1 tablet (40 mg total) by mouth daily.    Take 1 tablet (40 mg total) by mouth daily.  Discontinued Medications   No medications on file

## 2013-04-02 DIAGNOSIS — R972 Elevated prostate specific antigen [PSA]: Secondary | ICD-10-CM | POA: Diagnosis not present

## 2013-04-02 DIAGNOSIS — N401 Enlarged prostate with lower urinary tract symptoms: Secondary | ICD-10-CM | POA: Diagnosis not present

## 2013-04-02 DIAGNOSIS — R339 Retention of urine, unspecified: Secondary | ICD-10-CM | POA: Diagnosis not present

## 2013-05-11 DIAGNOSIS — R339 Retention of urine, unspecified: Secondary | ICD-10-CM | POA: Diagnosis not present

## 2013-06-10 DIAGNOSIS — H6120 Impacted cerumen, unspecified ear: Secondary | ICD-10-CM | POA: Diagnosis not present

## 2013-06-21 DIAGNOSIS — H353190 Nonexudative age-related macular degeneration, unspecified eye, stage unspecified: Secondary | ICD-10-CM | POA: Diagnosis not present

## 2013-06-21 DIAGNOSIS — Z961 Presence of intraocular lens: Secondary | ICD-10-CM | POA: Diagnosis not present

## 2013-07-01 DIAGNOSIS — Z961 Presence of intraocular lens: Secondary | ICD-10-CM | POA: Diagnosis not present

## 2013-07-01 DIAGNOSIS — H02109 Unspecified ectropion of unspecified eye, unspecified eyelid: Secondary | ICD-10-CM | POA: Diagnosis not present

## 2013-07-01 DIAGNOSIS — H353190 Nonexudative age-related macular degeneration, unspecified eye, stage unspecified: Secondary | ICD-10-CM | POA: Diagnosis not present

## 2013-07-22 DIAGNOSIS — R972 Elevated prostate specific antigen [PSA]: Secondary | ICD-10-CM | POA: Diagnosis not present

## 2013-07-29 ENCOUNTER — Encounter (HOSPITAL_COMMUNITY): Payer: Self-pay | Admitting: Emergency Medicine

## 2013-07-29 ENCOUNTER — Observation Stay (HOSPITAL_COMMUNITY)
Admission: EM | Admit: 2013-07-29 | Discharge: 2013-07-30 | Disposition: A | Discharge status: Home / Self Care | Payer: Medicare Other | Attending: Internal Medicine | Admitting: Internal Medicine

## 2013-07-29 ENCOUNTER — Emergency Department (HOSPITAL_COMMUNITY): Payer: Medicare Other

## 2013-07-29 DIAGNOSIS — R0789 Other chest pain: Secondary | ICD-10-CM | POA: Diagnosis not present

## 2013-07-29 DIAGNOSIS — J9819 Other pulmonary collapse: Secondary | ICD-10-CM | POA: Diagnosis not present

## 2013-07-29 DIAGNOSIS — E785 Hyperlipidemia, unspecified: Secondary | ICD-10-CM | POA: Diagnosis not present

## 2013-07-29 DIAGNOSIS — Z79899 Other long term (current) drug therapy: Secondary | ICD-10-CM | POA: Diagnosis not present

## 2013-07-29 DIAGNOSIS — I10 Essential (primary) hypertension: Secondary | ICD-10-CM | POA: Diagnosis not present

## 2013-07-29 DIAGNOSIS — Z23 Encounter for immunization: Secondary | ICD-10-CM | POA: Diagnosis not present

## 2013-07-29 DIAGNOSIS — N400 Enlarged prostate without lower urinary tract symptoms: Secondary | ICD-10-CM | POA: Insufficient documentation

## 2013-07-29 DIAGNOSIS — R079 Chest pain, unspecified: Secondary | ICD-10-CM | POA: Diagnosis not present

## 2013-07-29 DIAGNOSIS — F17200 Nicotine dependence, unspecified, uncomplicated: Secondary | ICD-10-CM | POA: Diagnosis not present

## 2013-07-29 LAB — HEPATIC FUNCTION PANEL
ALT: 18 U/L (ref 0–53)
AST: 17 U/L (ref 0–37)
Total Protein: 7.6 g/dL (ref 6.0–8.3)

## 2013-07-29 LAB — CBC WITH DIFFERENTIAL/PLATELET
Basophils Absolute: 0 10*3/uL (ref 0.0–0.1)
Eosinophils Relative: 0 % (ref 0–5)
HCT: 47.1 % (ref 39.0–52.0)
Lymphocytes Relative: 11 % — ABNORMAL LOW (ref 12–46)
Lymphs Abs: 1 10*3/uL (ref 0.7–4.0)
MCV: 91.6 fL (ref 78.0–100.0)
Monocytes Absolute: 0.4 10*3/uL (ref 0.1–1.0)
RDW: 13.7 % (ref 11.5–15.5)
WBC: 9.7 10*3/uL (ref 4.0–10.5)

## 2013-07-29 LAB — URINALYSIS, ROUTINE W REFLEX MICROSCOPIC
Bilirubin Urine: NEGATIVE
Glucose, UA: NEGATIVE mg/dL
Hgb urine dipstick: NEGATIVE
Ketones, ur: NEGATIVE mg/dL
pH: 5.5 (ref 5.0–8.0)

## 2013-07-29 LAB — URINE MICROSCOPIC-ADD ON

## 2013-07-29 LAB — BASIC METABOLIC PANEL
CO2: 22 mEq/L (ref 19–32)
Chloride: 99 mEq/L (ref 96–112)
Creatinine, Ser: 0.88 mg/dL (ref 0.50–1.35)

## 2013-07-29 LAB — LIPASE, BLOOD: Lipase: 24 U/L (ref 11–59)

## 2013-07-29 LAB — LIPID PANEL
Cholesterol: 154 mg/dL (ref 0–200)
HDL: 63 mg/dL (ref >39)
Total CHOL/HDL Ratio: 2.4 RATIO

## 2013-07-29 MED ORDER — DIPHENHYDRAMINE HCL 25 MG PO CAPS: 25.0000 mg | ORAL_CAPSULE | Freq: Every evening | ORAL | Status: DC | PRN

## 2013-07-29 MED ORDER — FINASTERIDE 5 MG PO TABS
5.0000 mg | ORAL_TABLET | Freq: Every day | ORAL | Status: DC
  Administered 2013-07-29 – 2013-07-30 (×2): 5 mg via ORAL
  Filled 2013-07-29 (×2): qty 1

## 2013-07-29 MED ORDER — ONDANSETRON HCL 4 MG/2ML IJ SOLN: 4.0000 mg | Freq: Four times a day (QID) | INTRAMUSCULAR | Status: DC | PRN

## 2013-07-29 MED ORDER — MULTIVITAMINS PO CAPS: 1.0000 | ORAL_CAPSULE | Freq: Every day | ORAL | Status: DC

## 2013-07-29 MED ORDER — TAMSULOSIN HCL 0.4 MG PO CAPS
0.4000 mg | ORAL_CAPSULE | Freq: Every day | ORAL | Status: DC
  Administered 2013-07-29: 0.4 mg via ORAL
  Filled 2013-07-29 (×2): qty 1

## 2013-07-29 MED ORDER — INFLUENZA VAC SPLIT QUAD 0.5 ML IM SUSP
0.5000 mL | INTRAMUSCULAR | Status: AC
  Administered 2013-07-30: 0.5 mL via INTRAMUSCULAR
  Filled 2013-07-29: qty 0.5

## 2013-07-29 MED ORDER — AMLODIPINE BESYLATE 10 MG PO TABS
10.0000 mg | ORAL_TABLET | Freq: Every day | ORAL | Status: DC
  Administered 2013-07-30: 10 mg via ORAL
  Filled 2013-07-29: qty 1

## 2013-07-29 MED ORDER — METOPROLOL SUCCINATE ER 50 MG PO TB24
50.0000 mg | ORAL_TABLET | Freq: Every day | ORAL | Status: DC
  Administered 2013-07-29 – 2013-07-30 (×2): 50 mg via ORAL
  Filled 2013-07-29 (×2): qty 1

## 2013-07-29 MED ORDER — ADULT MULTIVITAMIN W/MINERALS CH
1.0000 | ORAL_TABLET | Freq: Every day | ORAL | Status: DC
  Administered 2013-07-29 – 2013-07-30 (×2): 1 via ORAL
  Filled 2013-07-29 (×2): qty 1

## 2013-07-29 MED ORDER — GI COCKTAIL ~~LOC~~
30.0000 mL | Freq: Once | ORAL | Status: AC
  Administered 2013-07-29: 30 mL via ORAL
  Filled 2013-07-29: qty 30

## 2013-07-29 MED ORDER — ASPIRIN EC 81 MG PO TBEC
81.0000 mg | DELAYED_RELEASE_TABLET | Freq: Every day | ORAL | Status: DC
  Administered 2013-07-29 – 2013-07-30 (×2): 81 mg via ORAL
  Filled 2013-07-29 (×2): qty 1

## 2013-07-29 MED ORDER — SIMVASTATIN 40 MG PO TABS
40.0000 mg | ORAL_TABLET | Freq: Every day | ORAL | Status: DC
  Administered 2013-07-29: 40 mg via ORAL
  Filled 2013-07-29 (×2): qty 1

## 2013-07-29 MED ORDER — ENOXAPARIN SODIUM 40 MG/0.4ML ~~LOC~~ SOLN
40.0000 mg | SUBCUTANEOUS | Status: DC
  Administered 2013-07-29: 40 mg via SUBCUTANEOUS
  Filled 2013-07-29 (×2): qty 0.4

## 2013-07-29 MED ORDER — ACETAMINOPHEN 325 MG PO TABS: 325.0000 mg | ORAL_TABLET | Freq: Every evening | ORAL | Status: DC | PRN

## 2013-07-29 MED ORDER — ACETAMINOPHEN 325 MG PO TABS: 650.0000 mg | ORAL_TABLET | ORAL | Status: DC | PRN

## 2013-07-29 MED ORDER — ASPIRIN 81 MG PO TABS: 81.0000 mg | ORAL_TABLET | Freq: Every day | ORAL | Status: DC

## 2013-07-29 MED ORDER — ZOLPIDEM TARTRATE 5 MG PO TABS: 5.0000 mg | ORAL_TABLET | Freq: Every evening | ORAL | Status: DC | PRN

## 2013-07-29 MED ORDER — NITROGLYCERIN 0.4 MG SL SUBL: 0.4000 mg | SUBLINGUAL_TABLET | SUBLINGUAL | Status: DC | PRN

## 2013-07-29 MED ORDER — PANTOPRAZOLE SODIUM 40 MG IV SOLR
40.0000 mg | Freq: Two times a day (BID) | INTRAVENOUS | Status: DC
  Administered 2013-07-29 – 2013-07-30 (×2): 40 mg via INTRAVENOUS
  Filled 2013-07-29 (×3): qty 40

## 2013-07-29 NOTE — ED Notes (Signed)
Pt brought to ED by EMS with chest pain central and radiating to back with nausea.Pt had asprin 325mg  and nitroX1 given by EMS.

## 2013-07-29 NOTE — ED Provider Notes (Signed)
CSN: [id number]     Arrival date & time 07/29/13  1251 History   First MD Initiated Contact with Patient 07/29/13 1254     Chief Complaint  Patient presents with  . Chest Pain   (Consider location/radiation/quality/duration/timing/severity/associated sxs/prior Treatment) HPI This is a 72 year old male with a history of hypertension, hypercholesterolemia, and is a current smoker who presents with chest pain. The patient reports onset of chest pain this morning at approximately 10:30 AM. He describes as heaviness over his anterior chest, nonradiating. The patient's current pain is 4/10.   He states that radiates to his back. He denies any ripping or tearing nature to the pain. He states that he felt cool and clammy at that time. He thought was indigestion. There was no exertional component. It was worse with laying flat. He took his wife's Pepcid without any relief. Patient did take a full dose aspirin prior to arrival.  Past Medical History  Diagnosis Date  . Hyperlipidemia   . Hypertension    Past Surgical History  Procedure Laterality Date  . Tonsillectomy and adenoidectomy      Surg as a child...  . Skin cancer excision      Surg x2...   Family History  Problem Relation Age of Onset  . Colon cancer Neg Hx   . Stomach cancer Neg Hx   . Rectal cancer Neg Hx    History  Substance Use Topics  . Smoking status: Current Every Day Smoker -- 0.80 packs/day    Types: Cigarettes  . Smokeless tobacco: Never Used     Comment: uses electronic cig  . Alcohol Use: 12.6 oz/week    21 Glasses of wine per week    Review of Systems  Constitutional: Negative.  Negative for fever.  Respiratory: Positive for chest tightness. Negative for shortness of breath.   Cardiovascular: Positive for chest pain.  Gastrointestinal: Negative.  Negative for nausea, vomiting and abdominal pain.  Genitourinary: Negative.  Negative for dysuria.  Musculoskeletal: Positive for back pain.  Skin: Positive for  pallor.  Neurological: Negative for headaches.  All other systems reviewed and are negative.    Allergies  Sulfa antibiotics and Penicillins  Home Medications   Current Outpatient Rx  Name  Route  Sig  Dispense  Refill  . amLODipine (NORVASC) 10 MG tablet   Oral   Take 1 tablet (10 mg total) by mouth daily.   90 tablet   3   . aspirin 81 MG tablet   Oral   Take 81 mg by mouth daily. 2 tablets daily         . finasteride (PROSCAR) 5 MG tablet   Oral   Take 5 mg by mouth daily.         Marland Kitchen lisinopril (PRINIVIL,ZESTRIL) 20 MG tablet   Oral   Take 1 tablet (20 mg total) by mouth daily.   90 tablet   3   . metoprolol succinate (TOPROL-XL) 50 MG 24 hr tablet   Oral   Take 1 tablet (50 mg total) by mouth daily.   90 tablet   3   . Multiple Vitamin (MULTIVITAMIN) capsule   Oral   Take 1 capsule by mouth daily.         Marland Kitchen OVER THE COUNTER MEDICATION   Oral   Take 1 tablet by mouth daily. Medication: Eye Formula with Lutein         . simvastatin (ZOCOR) 40 MG tablet   Oral   Take 1  tablet (40 mg total) by mouth daily.   90 tablet   3   . tamsulosin (FLOMAX) 0.4 MG CAPS   Oral   Take 0.4 mg by mouth daily after supper.          BP 138/55  Pulse 57  Temp(Src) 98.7 F (37.1 C) (Oral)  Resp 15  SpO2 96% Physical Exam  Nursing note and vitals reviewed. Constitutional: He is oriented to person, place, and time. He appears well-developed and well-nourished. No distress.  HENT:  Head: Normocephalic and atraumatic.  Eyes: Pupils are equal, round, and reactive to light.  Neck: Neck supple.  Cardiovascular: Normal rate, regular rhythm and normal heart sounds.   No murmur heard. Pulmonary/Chest: Effort normal and breath sounds normal. No respiratory distress. He has no wheezes.  Abdominal: Soft. Bowel sounds are normal. There is no tenderness. There is no rebound.  Musculoskeletal: He exhibits no edema.  Lymphadenopathy:    He has no cervical adenopathy.   Neurological: He is alert and oriented to person, place, and time.  Skin: Skin is warm and dry.  Psychiatric: He has a normal mood and affect.    ED Course  Procedures (including critical care time) Labs Review Labs Reviewed  BASIC METABOLIC PANEL - Abnormal; Notable for the following:    Sodium 134 (*)    Glucose, Bld 126 (*)    BUN 24 (*)    GFR calc non Af Amer 83 (*)    All other components within normal limits  CBC WITH DIFFERENTIAL - Abnormal; Notable for the following:    Neutrophils Relative % 84 (*)    Neutro Abs 8.2 (*)    Lymphocytes Relative 11 (*)    All other components within normal limits  URINALYSIS, ROUTINE W REFLEX MICROSCOPIC  POCT I-STAT TROPONIN I   Imaging Review Dg Chest 2 View  07/29/2013   *RADIOLOGY REPORT*  Clinical Data: Chest pain.  CHEST - 2 VIEW  Comparison: 01/25/2013  Findings: The heart size and pulmonary vascularity are normal. There are areas of linear atelectasis at both lung bases.  No infiltrates or effusions.  No acute osseous abnormality.  IMPRESSION: Slight linear atelectasis at both lung bases.   Original Report Authenticated By: Lorriane Shire, M.D.   EKG independently reviewed by myself: Normal sinus rhythm with a rate of 60, no evidence of ST elevation or ischemic changes, unchanged when compared to prior    MDM   1. Chest pain    This Is a 72 year old gentleman with a history of hyperlipidemia, hypertension and is a current smoker who presents for chest pain. Onset of chest pain was 10:30 AM approximately 3 hours prior to arrival. Patient currently rates his pain is 4 out of 10. Patient has risk factors for ACS and has not had a stress test in 8 years.  Initial ACS workup is negative. The positional component of the pain and the belching may suggest a GI component. Patient was given a GI cocktail. On reassessment, the patient denies any pain.  Patient is not low risk and has a TIMI score of 3. While his presentation is atypical, his  diaphoresis and pressure like chest pain at onset are concerning. He will be admitted for chest pain rule out.    Merryl Hacker, MD 07/29/13 [phone]

## 2013-07-29 NOTE — H&P (Addendum)
[facility] History and Physical  Jeremy [name] VK:[id number] DOB: 05/13/1941 DOA: 07/29/2013  Referring physician: Dina Rich PCP: Noralee Space, MD  Specialists: none  Chief Complaint: Chest pain.   HPI: Jeremy [name] is a 72 y.o. male with PMH significant for hypertension, BPH, hyperlipidemia, current smoker who presents complaining of chest pain. The pain started the morning of admission, pressure like, radiating to his back. He felt like he needed to belch. After he was able to belch pain resolved. Pain was associated with sweating, no SOB, nausea or vomiting. He received aspirin by EMS. He took Pepcid, and relates mild relieved. No prior episodes like this. He denies chest pain or dyspnea on exertion. He said, I felt better after I walk.  He had couple of BM, loose the morning of admission.   Review of Systems: negative except as per HPI.   Past Medical History  Diagnosis Date  . Hyperlipidemia   . Hypertension    Past Surgical History  Procedure Laterality Date  . Tonsillectomy and adenoidectomy      Surg as a child...  . Skin cancer excision      Surg x2...   Social History:  reports that he has been smoking Cigarettes.  He has been smoking about 0.80 packs per day. He has never used smokeless tobacco. He reports that he drinks about 12.6 ounces of alcohol per week. He reports that he does not use illicit drugs. He denies prior history of DT. He is married, live with wife at home. He has 2 children, one with type 1 diabetes.   Allergies  Allergen Reactions  . Sulfa Antibiotics     Rash, light headed, shaking  . Penicillins     REACTION: hives severe as a child    Family History  Problem Relation Age of Onset  . Colon cancer Neg Hx   . Stomach cancer Neg Hx   . Rectal cancer Neg Hx     Prior to Admission medications   Medication Sig Start Date End Date Taking? Authorizing Provider  amLODipine (NORVASC) 10 MG tablet Take 1 tablet (10 mg total) by mouth daily.  01/25/13 01/25/14 Yes Noralee Space, MD  aspirin 81 MG tablet Take 81 mg by mouth daily. 2 tablets daily   Yes Historical Provider, MD  finasteride (PROSCAR) 5 MG tablet Take 5 mg by mouth daily.   Yes Historical Provider, MD  lisinopril (PRINIVIL,ZESTRIL) 20 MG tablet Take 1 tablet (20 mg total) by mouth daily. 01/25/13 01/25/14 Yes Noralee Space, MD  metoprolol succinate (TOPROL-XL) 50 MG 24 hr tablet Take 1 tablet (50 mg total) by mouth daily. 01/25/13 01/25/14 Yes Noralee Space, MD  Multiple Vitamin (MULTIVITAMIN) capsule Take 1 capsule by mouth daily.   Yes Historical Provider, MD  OVER THE COUNTER MEDICATION Take 1 tablet by mouth daily. Medication: Eye Formula with Lutein   Yes Historical Provider, MD  simvastatin (ZOCOR) 40 MG tablet Take 1 tablet (40 mg total) by mouth daily. 01/25/13 01/25/14 Yes Noralee Space, MD  tamsulosin (FLOMAX) 0.4 MG CAPS Take 0.4 mg by mouth daily after supper.   Yes Historical Provider, MD   Physical Exam: Filed Vitals:   07/29/13 1600  BP: 144/75  Pulse: 67  Temp:   Resp: 16   General Appearance:    Alert, cooperative, no distress, appears stated age  Head:    Normocephalic, without obvious abnormality, atraumatic  Eyes:    PERRL, conjunctiva/corneas clear, EOM's intact,  Ears:    Normal TM's and external ear canals, both ears  Nose:   Nares normal, septum midline, mucosa normal, no drainage    or sinus tenderness  Throat:   Lips, mucosa, and tongue normal; teeth and gums normal  Neck:   Supple, symmetrical, trachea midline, no adenopathy;       thyroid:  No enlargement/tenderness/nodules; no carotid   bruit or JVD  Back:     Symmetric, no curvature, ROM normal, no CVA tenderness  Lungs:     Clear to auscultation bilaterally, respirations unlabored  Chest wall:    No tenderness or deformity  Heart:    Regular rate and rhythm, S1 and S2 normal, no murmur, rub   or gallop  Abdomen:     Soft, non-tender, bowel sounds active all four quadrants,    no  masses, no organomegaly        Extremities:   Extremities normal, atraumatic, no cyanosis or edema  Pulses:   2+ and symmetric all extremities  Skin:   Skin color, texture, turgor normal, no rashes or lesions  Lymph nodes:   Cervical, supraclavicular, and axillary nodes normal  Neurologic:   CNII-XII intact. Normal strength, sensation and reflexes      throughout      Labs on Admission:  Basic Metabolic Panel:  Recent Labs Lab 07/29/13 1308  NA 134*  K 3.9  CL 99  CO2 22  GLUCOSE 126*  BUN 24*  CREATININE 0.88  CALCIUM 9.2   Liver Function Tests: No results found for this basename: AST, ALT, ALKPHOS, BILITOT, PROT, ALBUMIN,  in the last 168 hours No results found for this basename: LIPASE, AMYLASE,  in the last 168 hours No results found for this basename: AMMONIA,  in the last 168 hours CBC:  Recent Labs Lab 07/29/13 1308  WBC 9.7  NEUTROABS 8.2*  HGB 16.3  HCT 47.1  MCV 91.6  PLT 195   Cardiac Enzymes: No results found for this basename: CKTOTAL, CKMB, CKMBINDEX, TROPONINI,  in the last 168 hours  BNP (last 3 results) No results found for this basename: PROBNP,  in the last 8760 hours CBG: No results found for this basename: GLUCAP,  in the last 168 hours  Radiological Exams on Admission: Dg Chest 2 View  07/29/2013   *RADIOLOGY REPORT*  Clinical Data: Chest pain.  CHEST - 2 VIEW  Comparison: 01/25/2013  Findings: The heart size and pulmonary vascularity are normal. There are areas of linear atelectasis at both lung bases.  No infiltrates or effusions.  No acute osseous abnormality.  IMPRESSION: Slight linear atelectasis at both lung bases.   Original Report Authenticated By: Lorriane Shire, M.D.    EKG: Independently reviewed. No ST segment elevation.   Assessment/Plan Active Problems:   CIGARETTE SMOKER   HYPERTENSION   Chest pain   1-Chest pain: atypical symptoms but patient with multiple risk factors, age, current smoker, history HTN,  hyperlipidemia. TIMI score around 4. Admit to telemetry cycle cardiac enzymes. Symptoms suggestive of GI origin. Will start protonix. Check LFT, lipase. Continue with B Blocker, nitroglycerin PRN for pain.  Chest x ray with linear atelectasis.  Addendum:  He has history of gallstone. Will check LFT, no RUQ abdominal pain, Abdominal exam benigne.  2-HTN: continue with Norvasc, metoprolol.  3-Hyperlipidemia: Continue with statin.  4-BPH: continue with Flomax, finasteride.   Code Status: Presume Full Code.  Family Communication: Care discussed with patient and wife who was at bedside.  Disposition Plan: admit  under observation.   Time spent: 65 minutes.   Illias Pantano [facility] Pager [phone]  If 7PM-7AM, please contact night-coverage [email or web address] Password [facility] 07/29/2013, 4:51 PM

## 2013-07-30 DIAGNOSIS — I517 Cardiomegaly: Secondary | ICD-10-CM

## 2013-07-30 DIAGNOSIS — R079 Chest pain, unspecified: Secondary | ICD-10-CM | POA: Diagnosis not present

## 2013-07-30 LAB — TROPONIN I: Troponin I: <0.3 ng/mL (ref <0.30)

## 2013-07-30 MED ORDER — PANTOPRAZOLE SODIUM 40 MG PO TBEC: 40.0000 mg | DELAYED_RELEASE_TABLET | Freq: Every day | ORAL | Status: DC

## 2013-07-30 NOTE — Progress Notes (Signed)
UR Completed Mariel Lukins Graves-Bigelow, RN,BSN [phone]  

## 2013-07-30 NOTE — Discharge Summary (Signed)
Physician Discharge Summary  Jeremy [name]. Acebo VK:[id number] DOB: 10-17-1941 DOA: 07/29/2013  PCP: Noralee Space, MD  Admit date: 07/29/2013 Discharge date: 07/30/2013  Time spent: 35 minutes  Recommendations for Outpatient Follow-up:  1. Follow up with PCP for chronic medical problems.  2. Follow up with cardiology for evaluation for stress test.   Discharge Diagnoses:    Chest pain, probably gastritis.    CIGARETTE SMOKER   HYPERTENSION     Discharge Condition: Stable.   Diet recommendation: Heart Healthy  Filed Weights   07/29/13 1740  Weight: 92.987 kg (205 lb)    History of present illness:  Jeremy [name]. Jeremy [name] is a 72 y.o. male with PMH significant for hypertension, BPH, hyperlipidemia, current smoker who presents complaining of chest pain. The pain started the morning of admission, pressure like, radiating to his back. He felt like he needed to belch. After he was able to belch pain resolved. Pain was associated with sweating, no SOB, nausea or vomiting. He received aspirin by EMS. He took Pepcid, and relates mild relieved. No prior episodes like this. He denies chest pain or dyspnea on exertion. He said, I felt better after I walk.  He had couple of BM, loose the morning of admission   Hospital Course:  1-Chest pain: patient presents with atypical chest pain. Troponin times 3 negative. LDL 81, HB-A1c at 5.6. Chest pain resolved. Probably related to indigestion, gastritis. Due to multiple risk factors I have arrange follow up with cardiologist. ECHO with NL EF and no wall motion abnormalities. Will give prescription for protonix.   Procedures:  ECHO: no wall motion abnormalities, normal EF, diastolic dysfunction grade 1.   Consultations:  none  Discharge Exam: Filed Vitals:   07/30/13 0554  BP: 157/74  Pulse: 72  Temp: 98.1 F (36.7 C)  Resp: 18    General: No distress.  Cardiovascular: S 1, S 2 RRR Respiratory: CTA  Discharge Instructions  Discharge Orders   Future Appointments Provider Department Dept Phone   01/26/2014 9:00 AM Noralee Space, MD [facility] [phone]   Future Orders Complete By Expires   Diet - low sodium heart healthy  As directed    Increase activity slowly  As directed        Medication List    STOP taking these medications       OVER THE COUNTER MEDICATION      TAKE these medications       amLODipine 10 MG tablet  Commonly known as:  NORVASC  Take 1 tablet (10 mg total) by mouth daily.     aspirin 81 MG tablet  Take 81 mg by mouth daily. 2 tablets daily     finasteride 5 MG tablet  Commonly known as:  PROSCAR  Take 5 mg by mouth daily.     lisinopril 20 MG tablet  Commonly known as:  PRINIVIL,ZESTRIL  Take 1 tablet (20 mg total) by mouth daily.     metoprolol succinate 50 MG 24 hr tablet  Commonly known as:  TOPROL-XL  Take 1 tablet (50 mg total) by mouth daily.     multivitamin capsule  Take 1 capsule by mouth daily.     pantoprazole 40 MG tablet  Commonly known as:  PROTONIX  Take 1 tablet (40 mg total) by mouth daily.     simvastatin 40 MG tablet  Commonly known as:  ZOCOR  Take 1 tablet (40 mg total) by mouth daily.     tamsulosin 0.4 MG  Caps capsule  Commonly known as:  FLOMAX  Take 0.4 mg by mouth daily after supper.       Allergies  Allergen Reactions  . Sulfa Antibiotics     Rash, light headed, shaking  . Penicillins     REACTION: hives severe as a child       Follow-up Information   Follow up with Noralee Space, MD.   Specialty:  Pulmonary Disease   Contact information:   [address] [place] [address] [phone]       Please follow up. [facility] cardiology will call you with appoitment. )        The results of significant diagnostics from this hospitalization (including imaging, microbiology, ancillary and laboratory) are listed below for reference.    Significant Diagnostic Studies: Dg Chest 2 View  07/29/2013   *RADIOLOGY REPORT*  Clinical  Data: Chest pain.  CHEST - 2 VIEW  Comparison: 01/25/2013  Findings: The heart size and pulmonary vascularity are normal. There are areas of linear atelectasis at both lung bases.  No infiltrates or effusions.  No acute osseous abnormality.  IMPRESSION: Slight linear atelectasis at both lung bases.   Original Report Authenticated By: Lorriane Shire, M.D.    Microbiology: No results found for this or any previous visit (from the past 240 hour(s)).   Labs: Basic Metabolic Panel:  Recent Labs Lab 07/29/13 1308  NA 134*  K 3.9  CL 99  CO2 22  GLUCOSE 126*  BUN 24*  CREATININE 0.88  CALCIUM 9.2   Liver Function Tests:  Recent Labs Lab 07/29/13 1707  AST 17  ALT 18  ALKPHOS 60  BILITOT 0.8  PROT 7.6  ALBUMIN 4.1    Recent Labs Lab 07/29/13 1707  LIPASE 24   No results found for this basename: AMMONIA,  in the last 168 hours CBC:  Recent Labs Lab 07/29/13 1308  WBC 9.7  NEUTROABS 8.2*  HGB 16.3  HCT 47.1  MCV 91.6  PLT 195   Cardiac Enzymes:  Recent Labs Lab 07/29/13 1707 07/29/13 2315 07/30/13 0422  TROPONINI <0.30 <0.30 <0.30   BNP: BNP (last 3 results) No results found for this basename: PROBNP,  in the last 8760 hours CBG: No results found for this basename: GLUCAP,  in the last 168 hours     Signed:  Haizley Cannella  [facility] 07/30/2013, 9:57 AM

## 2013-07-30 NOTE — Progress Notes (Signed)
  Echocardiogram 2D Echocardiogram has been performed.  Diamond Nickel 07/30/2013, 12:38 PM

## 2013-08-03 LAB — URINE CULTURE

## 2013-08-05 ENCOUNTER — Other Ambulatory Visit: Payer: Self-pay | Admitting: Pulmonary Disease

## 2013-08-05 MED ORDER — LEVOFLOXACIN 500 MG PO TABS: 500.0000 mg | ORAL_TABLET | Freq: Every day | ORAL | Status: DC

## 2013-08-06 DIAGNOSIS — N401 Enlarged prostate with lower urinary tract symptoms: Secondary | ICD-10-CM | POA: Diagnosis not present

## 2013-08-06 DIAGNOSIS — R339 Retention of urine, unspecified: Secondary | ICD-10-CM | POA: Diagnosis not present

## 2013-08-06 DIAGNOSIS — N390 Urinary tract infection, site not specified: Secondary | ICD-10-CM | POA: Diagnosis not present

## 2013-08-19 ENCOUNTER — Inpatient Hospital Stay: Payer: Medicare Other | Admitting: Pulmonary Disease

## 2013-08-27 DIAGNOSIS — F17200 Nicotine dependence, unspecified, uncomplicated: Secondary | ICD-10-CM | POA: Diagnosis not present

## 2013-08-27 DIAGNOSIS — Z6831 Body mass index (BMI) 31.0-31.9, adult: Secondary | ICD-10-CM | POA: Diagnosis not present

## 2013-08-27 DIAGNOSIS — H6120 Impacted cerumen, unspecified ear: Secondary | ICD-10-CM | POA: Diagnosis not present

## 2013-09-07 DIAGNOSIS — H02109 Unspecified ectropion of unspecified eye, unspecified eyelid: Secondary | ICD-10-CM | POA: Diagnosis not present

## 2013-09-07 DIAGNOSIS — H0230 Blepharochalasis unspecified eye, unspecified eyelid: Secondary | ICD-10-CM | POA: Diagnosis not present

## 2013-09-21 ENCOUNTER — Encounter: Payer: Self-pay | Admitting: Pulmonary Disease

## 2013-09-21 ENCOUNTER — Ambulatory Visit (INDEPENDENT_AMBULATORY_CARE_PROVIDER_SITE_OTHER): Payer: Medicare Other | Admitting: Pulmonary Disease

## 2013-09-21 VITALS — BP 134/60 | HR 64 | Temp 97.6°F | Ht 70.0 in | Wt 208.2 lb

## 2013-09-21 DIAGNOSIS — N390 Urinary tract infection, site not specified: Secondary | ICD-10-CM | POA: Diagnosis not present

## 2013-09-21 DIAGNOSIS — I10 Essential (primary) hypertension: Secondary | ICD-10-CM

## 2013-09-21 DIAGNOSIS — K8020 Calculus of gallbladder without cholecystitis without obstruction: Secondary | ICD-10-CM

## 2013-09-21 DIAGNOSIS — M1990 Unspecified osteoarthritis, unspecified site: Secondary | ICD-10-CM

## 2013-09-21 DIAGNOSIS — K5730 Diverticulosis of large intestine without perforation or abscess without bleeding: Secondary | ICD-10-CM

## 2013-09-21 DIAGNOSIS — J42 Unspecified chronic bronchitis: Secondary | ICD-10-CM

## 2013-09-21 DIAGNOSIS — Z87898 Personal history of other specified conditions: Secondary | ICD-10-CM

## 2013-09-21 DIAGNOSIS — C679 Malignant neoplasm of bladder, unspecified: Secondary | ICD-10-CM

## 2013-09-21 DIAGNOSIS — E663 Overweight: Secondary | ICD-10-CM

## 2013-09-21 DIAGNOSIS — D126 Benign neoplasm of colon, unspecified: Secondary | ICD-10-CM

## 2013-09-21 DIAGNOSIS — I872 Venous insufficiency (chronic) (peripheral): Secondary | ICD-10-CM

## 2013-09-21 DIAGNOSIS — E7800 Pure hypercholesterolemia, unspecified: Secondary | ICD-10-CM

## 2013-09-21 NOTE — Progress Notes (Signed)
Subjective:     Patient ID: Jeremy [name], male   DOB: 09-14-1941, 72 y.o.   MRN: [id number]  HPI 72 y/o WM here for a follow up visit... he has multiple medical problems as noted below...    ~  January 20, 2012:  Yearly ROV> Jeremy [name] reports doing well overall, no new complaints or concerns;  He checks w/ Derm- DrGruber every 21mo & had 2 basal cell cancers removed...    Ex-cig smoker> he tells me he quit regular cigs >58yr ago & finally quit the E-cig 46mo ago; admits to an occas cigar; denies cough, sputum, SOB, & he remains active w/ walking & golf.    HBP> on MetopXL50, Norvasc10, Lisinopril20; BP= 124/80 & similar at home, rare sharp CP, no palpit, denies SOB or edema...    Cholesterol> on Simva40; FLP showed TChol 162, TG 79, HDL 56, LDL 90    GI (divertics, polyps,hems) stable & colon f/u due Sept2013 per DrDBrodie...    GU (BPH, hx of Bladder Ca) followed by DrOttelin yearly (due 4/13); PSA=3.87 today...    Hx DJD & Vit D defic> followed by DrAlusio & he's taking 2000u VitD supplement... CXR 3/13 showed normal heart size, COPD w/ hyperinflation, clear/ NAD.Marland KitchenMarland Kitchen EKG 3/13 showed NSR, rate66, ?LAE, otherw WNL.Marland KitchenMarland Kitchen LABS 3/13:  FLP- at goals on Simva40;  Chems- wnl w/ BS110;  CBC- wnl;  TSH=0.88;  PSA=3.87  ~  January 25, 2013:  Yearly ROV & Jeremy [name] has had a good yr except for some recurrent UTIs which prompted a Urology f/u w/ DrOttelin & Borden> eval revealed worsening BPH/ LUTS and PVR>600cc; started on Proscar & Flomax w/ improvement (voiding better, decr nocturia, stream improved)... We reviewed the following medical problems during today's office visit >>     Ex-cig smoker> he quit regular cigs >45yrs ago & uses occas E-cig/ cigar; denies cough, sputum, SOB, & he remains active w/ walking & golfs regularly...    HBP> on MetopXL50, Norvasc10, Lisinopril20; BP= 128/68 & similar at home; denies CP/angina, palpit, SOB or edema...    Cholesterol> on Simva40; FLP 3/14 shows TChol 143, TG 44, HDL 57, LDL 78;  continue same...    GI (divertics, polyps,hems) stable & f/u colon done 11/13 by DrDBrodie- mod divertics, 2 sessile polyps w/ path revealing benign mucosa & f/u rec 68yrs.    GU- hx of Bladder Ca, BPH, UTIs, ED>  followed by DrOttelin/Borden & seen 1/14 w/ recurrent UTIs, signif PVR>600, & treated w/ Proscar5 & Flomax0.4 w/ sigif improvement in symptoms; cysto was otherw neg & PSA= 4.46 w/ 30%free...    Hx DJD & Vit D defic> followed by DrAlusio & he's taking 1000u VitD supplement... We reviewed prob list, meds, xrays and labs> see below for updates >>  CXR 3/14 showed norm heart size, clear lungs w/ min basilar scarring, NAD... LABS 3/14:  FLP- at goals on Simva40 (contin same);  Chems- ok x BS=128 (needs better diet to avoid DM meds);  CBC- wnl;  TSH=0.75...   ~  September 21, 2013:  56mo ROV & post hosp check> Jeremy [name] was Adm 9/18-19 w/ "indigestion" pain is epig/ lower chest wall w/ radiation to back; no assoc n/v, no SOB or palpit etc; this was followed by chills & sweat prompting call to EMS & taken to ER; the discomfort resolved w/ time & PPI rx;  Eval revealed a neg exam, clear CXR x bibasilar atx, norm EKG, neg cardiac enz, 2DEcho wnl x gr1DD;  He took  the PPI for awhile then stopped on his own w/o recurrent symptoms- denies abd pain, dysphagia, reflux, n/v, c/d, etc;  After disch a urine cult ret pos for >100K Enterococcus Faecalis, sens Levaquin & we treated him x7d for this prob;  He has had f/u DrBorden, Urology & he reports difficulty emptying his bladder & meds adjusted- Finasteride5 + Tamsulosin0.4 and improving...   He is sched to have eyelid surg by DrYeatts at [facility] 12/14...     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 ~  3/13: he tells me he quit regular cigs >72yr ago & finally quit  the E-cig 64mo ago; admits to an occas cigar; denies cough, sputum, SOB, & he remains active w/ walking & golf... ~  3/14: he quit regular cigs >34yrs ago & uses occas E-cig/ cigar; denies cough, sputum, SOB, & he remains active w/ walking & golfs regularly... ~  11/14: he has fallen off the wagon & was smoking again, quit again, & now back to Ecig; advised to quit completely, reminded about his bladder cancer & I told him lung ca is a lot worse! He declines smoking cessation help...  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 uses Mucinex as needed. 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 ~  CXR 3/11 showed some scarring at the bases, no change... ~  CXR 3/12 showed chr changes, incr markings, basilar scarring, NAD. ~  CXR 3/13 showed normal heart size, COPD w/ hyperinflation, clear/ NAD.Marland Kitchen. ~  CXR 3/14 showed norm heart size, clear lungs w/ min basilar scarring, NAD. ~  CXR 9/14 (hosp for atypCP, reflux & UTI) showed norm heart size, bibasilar atx, otherw clear, NAD...  HYPERTENSION (ICD-401.9) - on METOPROLOL XR 50mg /d,  AMLODIPINE 10mg /d,  LISINOPRIL 20mg /d...  he also takes ASA 81mg /d... he has seen DrWall ([facility]) in the past... BP= 134/60 & similar at home, takes meds regularly & tolerates well... denies HA, fatigue, visual changes, CP, palipit, dizziness, syncope, dyspnea, edema, etc...  ~  NuclearStressTest 8/07 showed a hypertensive response but no scar or ischemia, EF= 63%... no change from 2002 study... Toprol added... ~  EKG 9/14 showed NSR, rate60, WNL, NAD... ~  2DEcho 9/14 showed norm LV cavity size & wall thickness, norm wall motion & sys function w/ EF=55-60%, Gr1DD, mild RA&LA dil, valves looked ok...  VENOUS INSUFFICIENCY (ICD-459.81) - he has mod VI & intermittent min edema... he knows to elim salt, elevate, wear support hose, etc...  HYPERCHOLESTEROLEMIA (ICD-272.0) - on SIMVASTATIN 40mg /d...  ~  Jeremy [name] 7/08 showed TChol 144, TG 64, HDL 41, LDL  90...  ~  [place] 12/09 showed TChol 173, TG 82, HDL 51, LDL 106... rec- same med + better diet. ~  FLP 3/11 showed TChol 158, TG 116, HDL 54, LDL 81 ~  FLP 3/12 on Simva40 showed TChol 155, TG 55, HDL 54, LDL 90 ~  FLP 3/13 on Simva40 showed TChol 162, TG 79, HDL 56, LDL 90 ~  FLP 3/14 on Simva40 showed TChol 143, TG 44, HDL 57, LDL 78 ~  FLP 9/14 in hosp showed TChol 154, TG 51, HDL 63, LDL 81... rec to continue [place].  OVERWEIGHT (ICD-278.02) -  ~  weight 11/09 = 212#  since he cut way back on smoking... we discussed diet + exercise etc... ~  weight 3/11 = 207# ~  weight 3/12 = 211# ~  Weight 3/13 = 216# ~  Weight 3/14 = 211# ~  Weight 11/14 = 208#  DIVERTICULOSIS OF COLON (ICD-562.10),  COLONIC POLYPS (ICD-211.3),  & HEMORRHOIDS (ICD-455.6) -  - last colonoscopy 9/08 by DrDBrodie showed several 3-3mm polyps & divertics... path= adenomatous and f/u planned in 5 yrs... ~  F/u colonoscopy 11/13 by DrDBrodie showed 2 sessile polyps in desc colon, mod divertics, path= mult fragments of benign polypoid colorectal mucosa  GALLSTONES (ICD-574.20) - seen on prev CT Abd done by Urology...  Repeated URINARY TRACT INFECTIONS >> had Enterococcus UTI 1/14 treated by Urology w/ Levaquin... 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 he gets cystoscopies less often now...BPH w/ mild obstructive symptoms... intermittently elevated PSA on testing in the past... ~  labs 3/11 showed PSA = 3.64 ~  labs 3/12 showed PSA= 3.98 ~  Labs 3/13 showed PSA= 3.87 ~  Urology f/u DrBorden 1/14> Hx UTIs, Bladder Cancer, BPH/LTOS, & ED- on Proscar5 & Flomax0.4; they did cysto (neg) & PVR was 600cc, symptoms better on Rx now; they did PSA 1/14= 4.46 (30%free)...  ~  9/14> hosp w/ epig & lower CP w/ rad to back; urine grew  Enterococcus & treated again w/ Levaquin; Urology f/u DrBorden- hx bladder ca, neg cysto 1/14, BPH/LUTS w/ PVR>600 in past now improved on Finasteride & Flomax...   DEGENERATIVE JOINT DISEASE (ICD-715.90) - eval for left knee pain by DrAlusio w/ torn cartilage & baker's cyst... knee tapped, given shot w/ relief...  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 ~  labs 11/09 showed Vit D level = 22... rec to start 2000 u daily. ~  labs 3/11 showed Vit D level = 50... continue supplemental Vit D at 1000 u daily.  Health Maintenance: ~  Immunizations:  he gets the seasonal flu vaccine yearly;  had the PNEUMOVAX 2009 (age66);   TDAP 3/11...   Past Surgical History  Procedure Laterality Date  . Tonsillectomy and adenoidectomy      Surg as a child...  . Skin cancer excision      Surg x2...    Outpatient Encounter Prescriptions as of 09/21/2013  Medication Sig  . amLODipine (NORVASC) 10 MG tablet Take 1 tablet (10 mg total) by mouth daily.  Marland Kitchen aspirin 81 MG tablet Take 81 mg by mouth daily. 2 tablets daily  . beta carotene w/minerals (OCUVITE) tablet Take 1 tablet by mouth daily.  . finasteride (PROSCAR) 5 MG tablet Take 5 mg by mouth daily.  Marland Kitchen lisinopril (PRINIVIL,ZESTRIL) 20 MG tablet Take 1 tablet (20 mg total) by mouth daily.  . metoprolol succinate (TOPROL-XL) 50 MG 24 hr tablet Take 1 tablet (50 mg total) by mouth daily.  . Multiple Vitamin (MULTIVITAMIN) capsule Take 1 capsule by mouth daily.  . simvastatin (ZOCOR) 40 MG tablet Take 1 tablet (40 mg total) by mouth daily.  . tamsulosin (FLOMAX) 0.4 MG CAPS Take 0.4 mg by mouth daily after supper.  . [DISCONTINUED] levofloxacin (LEVAQUIN) 500 MG tablet Take 1 tablet (500 mg total) by mouth daily.  . [DISCONTINUED] pantoprazole (PROTONIX) 40 MG tablet Take 1 tablet (40 mg total) by  mouth daily.    Allergies  Allergen Reactions  . Sulfa Antibiotics     Rash, light headed, shaking  . Penicillins      REACTION: hives severe as a child    Current Medications, Allergies, Past Medical History, Past Surgical History, Family History, and Social History were reviewed in [employer] record.    Review of Systems    Constitutional:  Denies F/C/S, anorexia, unexpected weight change. HEENT:  No HA, visual changes, earache, nasal symptoms, sore throat, hoarseness. Resp:  No cough, sputum, hemoptysis; no SOB, tightness, wheezing. Cardio:  No CP, palpit, DOE, orthopnea, edema. GI:  Denies N/V/D/C or blood in stool; no reflux, abd pain, distention, or gas. GU:  No dysuria, freq, urgency, hematuria, or flank pain. MS:  Denies joint pain, swelling, tenderness, or decr ROM; no neck pain, back pain, etc. Neuro:  No tremors, seizures, dizziness, syncope, weakness, numbness, gait abn. Skin:  No suspicious lesions or skin rash. Heme:  No adenopathy, bruising, bleeding. Psyche: Denies confusion, sleep disturbance, hallucinations, anxiety, depression.   Objective:   Physical Exam    WD, Overweight, 72 y/o WM in NAD... GENERAL:  Alert & oriented; pleasant & cooperative... HEENT:  [place]/AT, EOM-wnl, PERRLA, EACs-clear, TMs-wnl, NOSE-clear, THROAT-clear & wnl. NECK:  Supple w/ fairROM; no JVD; normal carotid impulses w/o bruits; no thyromegaly or nodules palpated; no lymphadenopathy. CHEST:  Clear to P & A; without wheezes/ rales/ or rhonchi heard... HEART:  Regular Rhythm; without murmurs/ rubs/ or gallops detected... ABDOMEN:  Soft & nontender; normal bowel sounds; no organomegaly or masses palpated... EXT: without deformities, mild arthritic changes; no varicose veins, +ven insuffic, tr edema... NEURO:  CN's intact; motor testing normal; sensory testing normal; gait normal & balance OK. DERM:  No lesions noted; no rash etc...  RADIOLOGY DATA:  Reviewed in the EPIC EMR & discussed w/ the patient...  LABORATORY DATA:  Reviewed in the EPIC EMR & discussed w/ the patient...      Assessment:      COPD/ intermit smoker>  CXR w/ some bibasilar atx, but he is asymptomatic, denies cough/ SOB/ etc; needs to incr activity/ exercise & stay off the cigs!!!...  HBP>  Controlled on his 3 meds, continue same...  CHOL>  FLP is at the goals on Simva40 + diet...  OVERWEIGHT>  We reviewed diet & exercise program...  GI> Divertics, Polyps>  He is up to date w/ screening colonoscopies; he uses the Protonix 40mg  prn....  Gallstones>  Asymptomatic stones seen on prev scan...  BPH>  Enterococcus UTI treated w/ Levaquin; followed by DrOttelin/Borden & improved on Proscar/ Flomax...  DJD>  Aware, he uses OTC meds as needed...  Other medical issues as noted...  He is sched for eyelid surg by DrYeatts at [facility], ok for surg...     Plan:     Patient's Medications  New Prescriptions   No medications on file  Previous Medications   AMLODIPINE (NORVASC) 10 MG TABLET    Take 1 tablet (10 mg total) by mouth daily.   ASPIRIN 81 MG TABLET    Take 81 mg by mouth daily. 2 tablets daily   BETA CAROTENE W/MINERALS (OCUVITE) TABLET    Take 1 tablet by mouth daily.   FINASTERIDE (PROSCAR) 5 MG TABLET    Take 5 mg by mouth daily.   LISINOPRIL (PRINIVIL,ZESTRIL) 20 MG TABLET    Take 1 tablet (20 mg total) by mouth daily.   METOPROLOL SUCCINATE (TOPROL-XL) 50 MG 24 HR TABLET  Take 1 tablet (50 mg total) by mouth daily.   MULTIPLE VITAMIN (MULTIVITAMIN) CAPSULE    Take 1 capsule by mouth daily.   SIMVASTATIN (ZOCOR) 40 MG TABLET    Take 1 tablet (40 mg total) by mouth daily.   TAMSULOSIN (FLOMAX) 0.4 MG CAPS    Take 0.4 mg by mouth daily after supper.  Modified Medications   No medications on file  Discontinued Medications   LEVOFLOXACIN (LEVAQUIN) 500 MG TABLET    Take 1 tablet (500 mg total) by mouth daily.   PANTOPRAZOLE (PROTONIX) 40 MG TABLET    Take 1 tablet (40 mg total) by mouth daily.

## 2013-09-21 NOTE — Patient Instructions (Signed)
Today we updated your med list in our EPIC system...    Continue your current medications the same...  OK for eyelid surg & we will send notes to DrYeatts at [facility]...  Call for any questions or if we can be of service in any way.Marland KitchenMarland Kitchen

## 2013-10-12 DIAGNOSIS — Z7982 Long term (current) use of aspirin: Secondary | ICD-10-CM | POA: Diagnosis not present

## 2013-10-12 DIAGNOSIS — I498 Other specified cardiac arrhythmias: Secondary | ICD-10-CM | POA: Diagnosis not present

## 2013-10-12 DIAGNOSIS — I10 Essential (primary) hypertension: Secondary | ICD-10-CM | POA: Diagnosis not present

## 2013-10-12 DIAGNOSIS — H02109 Unspecified ectropion of unspecified eye, unspecified eyelid: Secondary | ICD-10-CM | POA: Diagnosis not present

## 2013-10-18 DIAGNOSIS — I10 Essential (primary) hypertension: Secondary | ICD-10-CM | POA: Diagnosis not present

## 2013-10-18 DIAGNOSIS — H3530 Unspecified macular degeneration: Secondary | ICD-10-CM | POA: Diagnosis not present

## 2013-10-18 DIAGNOSIS — H04569 Stenosis of unspecified lacrimal punctum: Secondary | ICD-10-CM | POA: Diagnosis not present

## 2013-10-18 DIAGNOSIS — H02109 Unspecified ectropion of unspecified eye, unspecified eyelid: Secondary | ICD-10-CM | POA: Diagnosis not present

## 2013-10-18 DIAGNOSIS — Z8551 Personal history of malignant neoplasm of bladder: Secondary | ICD-10-CM | POA: Diagnosis not present

## 2013-10-18 DIAGNOSIS — Z9849 Cataract extraction status, unspecified eye: Secondary | ICD-10-CM | POA: Diagnosis not present

## 2013-10-18 DIAGNOSIS — Z961 Presence of intraocular lens: Secondary | ICD-10-CM | POA: Diagnosis not present

## 2013-10-18 DIAGNOSIS — Z880 Allergy status to penicillin: Secondary | ICD-10-CM | POA: Diagnosis not present

## 2013-10-18 DIAGNOSIS — Z882 Allergy status to sulfonamides status: Secondary | ICD-10-CM | POA: Diagnosis not present

## 2013-10-18 DIAGNOSIS — I872 Venous insufficiency (chronic) (peripheral): Secondary | ICD-10-CM | POA: Diagnosis not present

## 2013-10-18 DIAGNOSIS — Z9889 Other specified postprocedural states: Secondary | ICD-10-CM | POA: Diagnosis not present

## 2013-10-18 DIAGNOSIS — N400 Enlarged prostate without lower urinary tract symptoms: Secondary | ICD-10-CM | POA: Diagnosis not present

## 2013-10-18 DIAGNOSIS — E785 Hyperlipidemia, unspecified: Secondary | ICD-10-CM | POA: Diagnosis not present

## 2013-10-18 DIAGNOSIS — Z7982 Long term (current) use of aspirin: Secondary | ICD-10-CM | POA: Diagnosis not present

## 2013-10-25 DIAGNOSIS — F17200 Nicotine dependence, unspecified, uncomplicated: Secondary | ICD-10-CM | POA: Diagnosis not present

## 2013-10-25 DIAGNOSIS — Z961 Presence of intraocular lens: Secondary | ICD-10-CM | POA: Diagnosis not present

## 2013-10-25 DIAGNOSIS — Z7982 Long term (current) use of aspirin: Secondary | ICD-10-CM | POA: Diagnosis not present

## 2013-10-25 DIAGNOSIS — I10 Essential (primary) hypertension: Secondary | ICD-10-CM | POA: Diagnosis not present

## 2013-10-25 DIAGNOSIS — Z48810 Encounter for surgical aftercare following surgery on the sense organs: Secondary | ICD-10-CM | POA: Diagnosis not present

## 2013-11-11 HISTORY — PX: BLADDER TUMOR EXCISION: SHX238

## 2013-11-30 DIAGNOSIS — Z48810 Encounter for surgical aftercare following surgery on the sense organs: Secondary | ICD-10-CM | POA: Diagnosis not present

## 2013-11-30 DIAGNOSIS — Z7982 Long term (current) use of aspirin: Secondary | ICD-10-CM | POA: Diagnosis not present

## 2013-11-30 DIAGNOSIS — F17200 Nicotine dependence, unspecified, uncomplicated: Secondary | ICD-10-CM | POA: Diagnosis not present

## 2013-11-30 DIAGNOSIS — H02849 Edema of unspecified eye, unspecified eyelid: Secondary | ICD-10-CM | POA: Diagnosis not present

## 2013-11-30 DIAGNOSIS — H0289 Other specified disorders of eyelid: Secondary | ICD-10-CM | POA: Diagnosis not present

## 2013-11-30 DIAGNOSIS — I10 Essential (primary) hypertension: Secondary | ICD-10-CM | POA: Diagnosis not present

## 2013-11-30 DIAGNOSIS — H02409 Unspecified ptosis of unspecified eyelid: Secondary | ICD-10-CM | POA: Diagnosis not present

## 2014-01-26 ENCOUNTER — Ambulatory Visit (INDEPENDENT_AMBULATORY_CARE_PROVIDER_SITE_OTHER): Payer: Medicare Other | Admitting: Pulmonary Disease

## 2014-01-26 ENCOUNTER — Ambulatory Visit: Payer: Medicare Other

## 2014-01-26 ENCOUNTER — Encounter: Payer: Self-pay | Admitting: Pulmonary Disease

## 2014-01-26 VITALS — BP 110/68 | HR 71 | Temp 97.0°F | Ht 70.0 in | Wt 213.8 lb

## 2014-01-26 DIAGNOSIS — F419 Anxiety disorder, unspecified: Secondary | ICD-10-CM

## 2014-01-26 DIAGNOSIS — I872 Venous insufficiency (chronic) (peripheral): Secondary | ICD-10-CM | POA: Diagnosis not present

## 2014-01-26 DIAGNOSIS — D126 Benign neoplasm of colon, unspecified: Secondary | ICD-10-CM

## 2014-01-26 DIAGNOSIS — M1990 Unspecified osteoarthritis, unspecified site: Secondary | ICD-10-CM

## 2014-01-26 DIAGNOSIS — E7800 Pure hypercholesterolemia, unspecified: Secondary | ICD-10-CM

## 2014-01-26 DIAGNOSIS — Z87898 Personal history of other specified conditions: Secondary | ICD-10-CM

## 2014-01-26 DIAGNOSIS — Z23 Encounter for immunization: Secondary | ICD-10-CM | POA: Diagnosis not present

## 2014-01-26 DIAGNOSIS — H6120 Impacted cerumen, unspecified ear: Secondary | ICD-10-CM

## 2014-01-26 DIAGNOSIS — E663 Overweight: Secondary | ICD-10-CM

## 2014-01-26 DIAGNOSIS — K8020 Calculus of gallbladder without cholecystitis without obstruction: Secondary | ICD-10-CM

## 2014-01-26 DIAGNOSIS — R252 Cramp and spasm: Secondary | ICD-10-CM

## 2014-01-26 DIAGNOSIS — K5730 Diverticulosis of large intestine without perforation or abscess without bleeding: Secondary | ICD-10-CM

## 2014-01-26 DIAGNOSIS — I10 Essential (primary) hypertension: Secondary | ICD-10-CM | POA: Diagnosis not present

## 2014-01-26 DIAGNOSIS — C679 Malignant neoplasm of bladder, unspecified: Secondary | ICD-10-CM

## 2014-01-26 DIAGNOSIS — J42 Unspecified chronic bronchitis: Secondary | ICD-10-CM

## 2014-01-26 LAB — HEPATIC FUNCTION PANEL
ALBUMIN: 4.6 g/dL (ref 3.5–5.2)
ALT: 22 U/L (ref 0–53)
AST: 19 U/L (ref 0–37)
Alkaline Phosphatase: 57 U/L (ref 39–117)
BILIRUBIN TOTAL: 1.1 mg/dL (ref 0.3–1.2)
Bilirubin, Direct: 0.1 mg/dL (ref 0.0–0.3)
Total Protein: 8.2 g/dL (ref 6.0–8.3)

## 2014-01-26 LAB — CBC WITH DIFFERENTIAL/PLATELET
BASOS PCT: 0.6 % (ref 0.0–3.0)
Basophils Absolute: 0 10*3/uL (ref 0.0–0.1)
EOS PCT: 1.7 % (ref 0.0–5.0)
Eosinophils Absolute: 0.1 10*3/uL (ref 0.0–0.7)
HEMATOCRIT: 49.7 % (ref 39.0–52.0)
HEMOGLOBIN: 16.8 g/dL (ref 13.0–17.0)
LYMPHS ABS: 1.9 10*3/uL (ref 0.7–4.0)
Lymphocytes Relative: 24.3 % (ref 12.0–46.0)
MCHC: 33.9 g/dL (ref 30.0–36.0)
MCV: 93 fl (ref 78.0–100.0)
MONO ABS: 0.7 10*3/uL (ref 0.1–1.0)
MONOS PCT: 8.3 % (ref 3.0–12.0)
Neutro Abs: 5.2 10*3/uL (ref 1.4–7.7)
Neutrophils Relative %: 65.1 % (ref 43.0–77.0)
Platelets: 287 10*3/uL (ref 150.0–400.0)
RBC: 5.34 Mil/uL (ref 4.22–5.81)
RDW: 13.9 % (ref 11.5–14.6)
WBC: 7.9 10*3/uL (ref 4.5–10.5)

## 2014-01-26 LAB — BASIC METABOLIC PANEL
BUN: 21 mg/dL (ref 6–23)
CO2: 26 mEq/L (ref 19–32)
Calcium: 9.7 mg/dL (ref 8.4–10.5)
Chloride: 101 mEq/L (ref 96–112)
Creatinine, Ser: 1.1 mg/dL (ref 0.4–1.5)
GFR: 69.61 mL/min (ref >60.00)
Glucose, Bld: 100 mg/dL — ABNORMAL HIGH (ref 70–99)
Potassium: 4.5 mEq/L (ref 3.5–5.1)
SODIUM: 138 meq/L (ref 135–145)

## 2014-01-26 LAB — TSH: TSH: 0.86 u[IU]/mL (ref 0.35–5.50)

## 2014-01-26 LAB — LIPID PANEL
CHOLESTEROL: 173 mg/dL (ref 0–200)
HDL: 55.5 mg/dL (ref >39.00)
LDL CALC: 97 mg/dL (ref 0–99)
Total CHOL/HDL Ratio: 3
Triglycerides: 103 mg/dL (ref 0.0–149.0)
VLDL: 20.6 mg/dL (ref 0.0–40.0)

## 2014-01-26 MED ORDER — SIMVASTATIN 40 MG PO TABS: 40.0000 mg | ORAL_TABLET | Freq: Every day | ORAL | Status: DC

## 2014-01-26 MED ORDER — AMLODIPINE BESYLATE 10 MG PO TABS: 10.0000 mg | ORAL_TABLET | Freq: Every day | ORAL | Status: DC

## 2014-01-26 MED ORDER — METOPROLOL SUCCINATE ER 50 MG PO TB24: 50.0000 mg | ORAL_TABLET | Freq: Every day | ORAL | Status: DC

## 2014-01-26 MED ORDER — LISINOPRIL 20 MG PO TABS: 20.0000 mg | ORAL_TABLET | Freq: Every day | ORAL | Status: DC

## 2014-01-26 NOTE — Progress Notes (Addendum)
Subjective:     Patient ID: Jeremy [name], male   DOB: 1940/11/21, 72 y.o.   MRN: [id number]  HPI 73 y/o WM here for a follow up visit... he has multiple medical problems as noted below...    ~  January 20, 2012:  Yearly ROV> [name] reports doing well overall, no new complaints or concerns;  He checks w/ Derm- DrGruber every 77mo& had 2 basal cell cancers removed...    Ex-cig smoker> he tells me he quit regular cigs >165yrgo & finally quit the E-cig 47m31moo; admits to an occas cigar; denies cough, sputum, SOB, & he remains active w/ walking & golf.    HBP> on MetopXL50, Norvasc10, Lisinopril20; BP= 124/80 & similar at home, rare sharp CP, no palpit, denies SOB or edema...    Cholesterol> on Simva40; FLP showed TChol 162, TG 79, HDL 56, LDL 90    GI (divertics, polyps,hems) stable & colon f/u due Sept2013 per DrDBrodie...    GU (BPH, hx of Bladder Ca) followed by DrOttelin yearly (due 4/13); PSA=3.87 today...    Hx DJD & Vit D defic> followed by DrAlusio & he's taking 2000u VitD supplement...  CXR 3/13 showed normal heart size, COPD w/ hyperinflation, clear/ NAD... Marland KitchenMarland KitchenKG 3/13 showed NSR, rate66, ?LAE, otherw WNL... Marland KitchenMarland KitchenABS 3/13:  FLP- at goals on Simva40;  Chems- wnl w/ BS110;  CBC- wnl;  TSH=0.88;  PSA=3.87  ~  January 25, 2013:  Yearly ROV & Jeremy [name] had a good yr except for some recurrent UTIs which prompted a Urology f/u w/ DrOttelin & Borden> eval revealed worsening BPH/ LUTS and PVR>600cc; started on Proscar & Flomax w/ improvement (voiding better, decr nocturia, stream improved)... We reviewed the following medical problems during today's office visit >>     Ex-cig smoker> he quit regular cigs >59yr76yro & uses occas E-cig/ cigar; denies cough, sputum, SOB, & he remains active w/ walking & golfs regularly...    HBP> on MetopXL50, Norvasc10, Lisinopril20; BP= 128/68 & similar at home; denies CP/angina, palpit, SOB or edema...    Cholesterol> on Simva40; FLP 3/14 shows TChol 143, TG 44, HDL 57, LDL  78; continue same...    GI (divertics, polyps,hems) stable & f/u colon done 11/13 by DrDBrodie- mod divertics, 2 sessile polyps w/ path revealing benign mucosa & f/u rec 24yr34yr GU- hx of Bladder Ca, BPH, UTIs, ED>  followed by DrOttelin/Borden & seen 1/14 w/ recurrent UTIs, signif PVR>600, & treated w/ Proscar5 & Flomax0.4 w/ sigif improvement in symptoms; cysto was otherw neg & PSA= 4.46 w/ 30%free...    Hx DJD & Vit D defic> followed by DrAlusio & he's taking 1000u VitD supplement... We reviewed prob list, meds, xrays and labs> see below for updates >>   CXR 3/14 showed norm heart size, clear lungs w/ min basilar scarring, NAD...  LABS 3/14:  FLP- at goals on Simva40 (contin same);  Chems- ok x BS=128 (needs better diet to avoid DM meds);  CBC- wnl;  TSH=0.75...   ~  September 21, 2013:  28mo R69mo post hosp check> Jeremy [name] [birth year] w/ "indigestion" pain is epig/ lower chest wall w/ radiation to back; no assoc n/v, no SOB or palpit etc; this was followed by chills & sweat prompting call to EMS & taken to ER; the discomfort resolved w/ time & PPI rx;  Eval revealed a neg exam, clear CXR x bibasilar atx, norm EKG, neg cardiac enz, 2DEcho wnl  x gr1DD;  He took the PPI for awhile then stopped on his own w/o recurrent symptoms- denies abd pain, dysphagia, reflux, n/v, c/d, etc;  After disch a urine cult ret pos for >100K Enterococcus Faecalis, sens Levaquin & we treated him x7d for this prob;  He has had f/u DrBorden, Urology & he reports difficulty emptying his bladder & meds adjusted- Finasteride5 + Tamsulosin0.4 and improving...   He is sched to have eyelid surg by DrYeatts at [facility] 12/14...     We reviewed the following medical problems during today's office visit >> he had the 2014 Flu vaccine 9/14  ~  January 26, 2014:  51moROV & here for his annnual check-up> JZebulenreports doing satis, [place]... [facility] We reviewed the following medical problems during today's office visit >>     Ex-cig smoker> he quit regular cigs >295yrago & uses occas E-cig/ cigar; denies cough, sputum, SOB, & he remains active w/ walking & golfs regularly...    HBP> on MetopXL50, Norvasc10, Lisinopril20; BP= 110/68 & similar at home; denies CP/angina, palpit, SOB or edema...    Cholesterol> on Simva40; FLP 3/15 shows TChol 173, TG 103, HDL 56, LDL 97; continue same...    GI (divertics, polyps,hems) stable & f/u colon done 11/13 by DrDBrodie- mod divertics, 2 sessile polyps w/ path revealing benign mucosa & f/u rec 1043yr   GU- hx of Bladder Ca, BPH, UTIs, ED>  followed by DrOttelin/Borden & seen 1/14 w/ recurrent UTIs, signif PVR>600, & treated w/ Proscar5 & Flomax0.4 w/ sigif improvement in symptoms; cysto was otherw neg & PSA= 4.46 w/ 30%free...    Hx DJD & Vit D defic> followed by DrAlusio & he's taking 1000u VitD supplement... We reviewed prob list, meds, xrays and labs> see below for updates >> Given Prevnar-13 today & Rx for Shingles shot...  LABS 3/15:  FLP- at goals on Simva40;  Chems- wnl;  CBC- wnl;  TSH=0.86...            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 ~  3/13: he tells me he quit regular cigs >48yr6yr & finally quit the E-cig 72mo 14mo admits to an occas cigar; denies cough, sputum, SOB, & he remains active w/ walking & golf... ~  3/14: he quit regular cigs >67yrs 248yr& uses occas E-cig/ cigar; denies cough, sputum, SOB, & he remains active w/ walking & golfs regularly... ~  11/14: he has fallen off the wagon & was smoking again, quit again, & now back to Ecig; advised to quit completely, reminded about his bladder cancer & I told him lung ca is a lot worse! He declines smoking cessation help... ~  3/15: he admits to "v[place]" [place] the [place]; again he is asked to quit completely...  BRONCHITIS,  CHRONIC (ICD-491.9) - some cough & sputum production.. uses Mucinex as needed. ~  CXR 11/09 showed mild COPD changes, NAD... ~  Marland KitchenXR 3/11 showed some scarring at the bases, no change... ~  CXR 3/12 showed chr changes, incr markings, basilar scarring, NAD. ~  CXR 3/13 showed normal heart size, COPD w/ hyperinflation, clear/ NAD... ~  Marland KitchenXR 3/14 showed norm heart size, clear lungs w/ min basilar scarring, NAD. ~  CXR 9/14 (hosp for atypCP, reflux & UTI)  showed norm heart size, bibasilar atx, otherw clear, NAD...  HYPERTENSION (ICD-401.9) - on METOPROLOL XR 26m/d,  AMLODIPINE 155md,  LISINOPRIL 2082m...  he also takes ASA 74m25m.. he has seen DrWall ([place]) in the past... BP= 134/60 & similar at home, takes meds regularly & tolerates well... denies HA, fatigue, visual changes, CP, palipit, dizziness, syncope, dyspnea, edema, etc...  ~  NuclearStressTest 8/07 showed a hypertensive response but no scar or ischemia, EF= 63%... no change from 2002 study... Toprol added... ~  EKG 9/14 showed NSR, rate60, WNL, NAD... ~  2DEcho 9/14 showed norm LV cavity size & wall thickness, norm wall motion & sys function w/ EF=55-60%, Gr1DD, mild RA&LA dil, valves looked ok... ~  3/15: on MetopXL50, Norvasc10, Lisinopril20; BP= 110/68 & similar at home; denies CP/angina, palpit, SOB or edema   VENOUS INSUFFICIENCY (ICD-459.81) - he has mod VI & intermittent min edema... he knows to elim salt, elevate, wear support hose, etc...  HYPERCHOLESTEROLEMIA (ICD-272.0) - on SIMVASTATIN 40mg40m.  ~  FLP 7[place] showed TChol 144, TG 64, HDL 41, LDL 90...  ~  FLP 1[place]9 showed TChol 173, TG 82, HDL 51, LDL 106... rec- same med + better diet. ~  FLP 3/11 showed TChol 158, TG 116, HDL 54, LDL 81 ~  FLP 3/12 on Simva40 showed TChol 155, TG 55, HDL 54, LDL 90 ~  FLP 3/13 on Simva40 showed TChol 162, TG 79, HDL 56, LDL 90 ~  FLP 3/14 on Simva40 showed TChol 143, TG 44, HDL 57, LDL 78 ~  FLP 9/14 in hosp showed TChol 154, TG 51, HDL  63, LDL 81... rec to continue Simva40. ~  FLP 3Echo on simva40 showed TChol 173, TG 103, HDL 56, LDL 97  OVERWEIGHT (ICD-278.02) -  ~  weight 11/09 = 212# since he cut way back on smoking... we discussed diet + exercise etc... ~  weight 3/11 = 207# ~  weight 3/12 = 211# ~  Weight 3/13 = 216# ~  Weight 3/14 = 211# ~  Weight 11/14 = 208# ~  Weight 3/15 = 214#  DIVERTICULOSIS OF COLON (ICD-562.10),  COLONIC POLYPS (ICD-211.3),  & HEMORRHOIDS (ICD-455.6) -  - last colonoscopy 9/08 by DrDBrodie showed several 3-4mm p46mps & divertics... path= adenomatous and f/u planned in 5 yrs... ~  F/u colonoscopy 11/13 by DrDBrodie showed 2 sessile polyps in desc colon, mod divertics, path= mult fragments of benign polypoid colorectal mucosa  GALLSTONES (ICD-574.20) - seen on prev CT Abd done by Urology...  Repeated URINARY TRACT INFECTIONS >> had Enterococcus UTI 1/14 treated by Urology w/ Levaquin... 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 he gets cystoscopies less often now...BPH w/ mild obstructive symptoms... intermittently elevated PSA on testing in the past... ~  labs 3/11 showed PSA = 3.64 ~  labs 3/12 showed PSA= 3.98 ~  Labs 3/13 showed PSA= 3.87 ~  Urology f/u DrBorden 1/14> Hx UTIs, Bladder Cancer, BPH/LTOS, & ED- on Proscar5 & Flomax0.4; they did cysto (neg) & PVR was 600cc, symptoms better on Rx now; they did PSA 1/14= 4.46 (30%free)...  ~  9/14> hosp w/ epig & lower CP w/ rad to back; urine grew Enterococcus & treated again w/ Levaquin; Urology f/u DrBorden- hx bladder ca, neg cysto 1/14, BPH/LUTS w/ PVR>600 in past now improved on Finasteride &  Flomax...  DEGENERATIVE JOINT DISEASE (ICD-715.90) - eval for left knee pain by DrAlusio w/ torn cartilage & baker's cyst... knee tapped, given shot  w/ relief...  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 ~  labs 11/09 showed Vit D level = 22... rec to start 2000 u daily. ~  labs 3/11 showed Vit D level = 50... continue supplemental Vit D at 1000 u daily.  Health Maintenance: ~  Immunizations:  he gets the seasonal flu vaccine yearly;  had the PNEUMOVAX 2009 (age66);   TDAP 3/11...   Past Surgical History  Procedure Laterality Date  . Tonsillectomy and adenoidectomy      Surg as a child...  . Skin cancer excision      Surg x2...  He had eyelid surg at [facility] 11/14 by DrYeatts...   Outpatient Encounter Prescriptions as of 01/26/2014  Medication Sig  . amLODipine (NORVASC) 10 MG tablet Take 1 tablet (10 mg total) by mouth daily.  Marland Kitchen aspirin 81 MG tablet Take 81 mg by mouth daily. 2 tablets daily  . beta carotene w/minerals (OCUVITE) tablet Take 1 tablet by mouth daily.  . finasteride (PROSCAR) 5 MG tablet Take 5 mg by mouth daily.  Marland Kitchen lisinopril (PRINIVIL,ZESTRIL) 20 MG tablet Take 1 tablet (20 mg total) by mouth daily.  . metoprolol succinate (TOPROL-XL) 50 MG 24 hr tablet Take 1 tablet (50 mg total) by mouth daily.  . Multiple Vitamin (MULTIVITAMIN) capsule Take 1 capsule by mouth daily.  . tamsulosin (FLOMAX) 0.4 MG CAPS Take 0.4 mg by mouth daily after supper.  . simvastatin (ZOCOR) 40 MG tablet Take 1 tablet (40 mg total) by mouth daily.    Allergies  Allergen Reactions  . Sulfa Antibiotics     Rash, light headed, shaking  . Penicillins     REACTION: hives severe as a child    Current Medications, Allergies, Past Medical History, Past Surgical History, Family History, and Social History were reviewed in [employer] record.    Review of Systems    Constitutional:  Denies F/C/S, anorexia, unexpected weight change. HEENT:  No HA, visual changes, earache, nasal symptoms, sore throat, hoarseness. Resp:  No cough, sputum, hemoptysis; no SOB, tightness,  wheezing. Cardio:  No CP, palpit, DOE, orthopnea, edema. GI:  Denies N/V/D/C or blood in stool; no reflux, abd pain, distention, or gas. GU:  No dysuria, freq, urgency, hematuria, or flank pain. MS:  Denies joint pain, swelling, tenderness, or decr ROM; no neck pain, back pain, etc. Neuro:  No tremors, seizures, dizziness, syncope, weakness, numbness, gait abn. Skin:  No suspicious lesions or skin rash. Heme:  No adenopathy, bruising, bleeding. Psyche: Denies confusion, sleep disturbance, hallucinations, anxiety, depression.   Objective:   Physical Exam    WD, Overweight, 73 y/o WM in NAD... GENERAL:  Alert & oriented; pleasant & cooperative... HEENT:  Ollie/AT, EOM-wnl, PERRLA, EACs-clear, TMs-wnl, NOSE-clear, THROAT-clear & wnl. NECK:  Supple w/ fairROM; no JVD; normal carotid impulses w/o bruits; no thyromegaly or nodules palpated; no lymphadenopathy. CHEST:  Clear to P & A; without wheezes/ rales/ or rhonchi heard... HEART:  Regular Rhythm; without murmurs/ rubs/ or gallops detected... ABDOMEN:  Soft & nontender; normal bowel sounds; no organomegaly or masses palpated... EXT: without deformities, mild arthritic changes; no varicose veins, +ven insuffic, tr edema... NEURO:  CN's intact; motor testing normal; sensory testing normal; gait normal & balance OK. DERM:  No lesions noted; no rash etc...  RADIOLOGY DATA:  Reviewed in the [facility] EMR & discussed w/ the patient...  LABORATORY DATA:  Reviewed in the EPIC EMR & discussed w/ the patient...     Assessment:      COPD/ intermit smoker>  CXR w/ some bibasilar atx, but he is asymptomatic, denies cough/ SOB/ etc; needs to incr activity/ exercise & stay off the cigs!!!... 3/15>  Given Prevnar-13  HBP>  Controlled on his 3 meds, continue same...  CHOL>  FLP is at the goals on Simva40 + diet...  OVERWEIGHT>  We reviewed diet & exercise program...  GI> Divertics, Polyps>  He is up to date w/ screening colonoscopies; he uses the  Protonix 64m prn....  Gallstones>  Asymptomatic stones seen on prev scan...  BPH>  Enterococcus UTI treated w/ Levaquin; followed by DrOttelin/Borden & improved on Proscar/ Flomax...  DJD>  Aware, he uses OTC meds as needed...  Other medical issues as noted...      Plan:     Patient's Medications  New Prescriptions   No medications on file  Previous Medications   ASPIRIN 81 MG TABLET    Take 81 mg by mouth daily. 2 tablets daily   BETA CAROTENE W/MINERALS (OCUVITE) TABLET    Take 1 tablet by mouth daily.   FINASTERIDE (PROSCAR) 5 MG TABLET    Take 5 mg by mouth daily.   MULTIPLE VITAMIN (MULTIVITAMIN) CAPSULE    Take 1 capsule by mouth daily.   TAMSULOSIN (FLOMAX) 0.4 MG CAPS    Take 0.4 mg by mouth daily after supper.  Modified Medications   Modified Medication Previous Medication   AMLODIPINE (NORVASC) 10 MG TABLET amLODipine (NORVASC) 10 MG tablet      Take 1 tablet (10 mg total) by mouth daily.    Take 1 tablet (10 mg total) by mouth daily.   LISINOPRIL (PRINIVIL,ZESTRIL) 20 MG TABLET lisinopril (PRINIVIL,ZESTRIL) 20 MG tablet      Take 1 tablet (20 mg total) by mouth daily.    Take 1 tablet (20 mg total) by mouth daily.   METOPROLOL SUCCINATE (TOPROL-XL) 50 MG 24 HR TABLET metoprolol succinate (TOPROL-XL) 50 MG 24 hr tablet      Take 1 tablet (50 mg total) by mouth daily.    Take 1 tablet (50 mg total) by mouth daily.   SIMVASTATIN (ZOCOR) 40 MG TABLET simvastatin (ZOCOR) 40 MG tablet      Take 1 tablet (40 mg total) by mouth daily.    Take 1 tablet (40 mg total) by mouth daily.  Discontinued Medications   No medications on file

## 2014-01-26 NOTE — Patient Instructions (Signed)
Today we updated your med list in our EPIC system...    Continue your current medications the same...  We refilled your meds per request...  Today we did your follow up FASTING blood work...    We will contact you w/ the results when available...     We will sent copies to DrYeatts [facility]...  We gave you the new Pneumonia vaccine (Prevnar-13) that is the last pneumonia shot you will require...  We wrote a prescription for the shingles vaccine to fill at your local [facility]...  We will refer you to an ENT specialist for cerumen impaction removal  Call for any questions.Marland KitchenMarland Kitchen

## 2014-02-01 DIAGNOSIS — H02849 Edema of unspecified eye, unspecified eyelid: Secondary | ICD-10-CM | POA: Diagnosis not present

## 2014-02-01 DIAGNOSIS — H0289 Other specified disorders of eyelid: Secondary | ICD-10-CM | POA: Diagnosis not present

## 2014-02-01 DIAGNOSIS — H02409 Unspecified ptosis of unspecified eyelid: Secondary | ICD-10-CM | POA: Diagnosis not present

## 2014-02-01 DIAGNOSIS — H02109 Unspecified ectropion of unspecified eye, unspecified eyelid: Secondary | ICD-10-CM | POA: Diagnosis not present

## 2014-02-01 DIAGNOSIS — Z961 Presence of intraocular lens: Secondary | ICD-10-CM | POA: Diagnosis not present

## 2014-02-01 DIAGNOSIS — Z9889 Other specified postprocedural states: Secondary | ICD-10-CM | POA: Diagnosis not present

## 2014-02-01 DIAGNOSIS — Z48810 Encounter for surgical aftercare following surgery on the sense organs: Secondary | ICD-10-CM | POA: Diagnosis not present

## 2014-02-01 DIAGNOSIS — F17200 Nicotine dependence, unspecified, uncomplicated: Secondary | ICD-10-CM | POA: Diagnosis not present

## 2014-02-01 DIAGNOSIS — Z7982 Long term (current) use of aspirin: Secondary | ICD-10-CM | POA: Diagnosis not present

## 2014-02-01 DIAGNOSIS — I10 Essential (primary) hypertension: Secondary | ICD-10-CM | POA: Diagnosis not present

## 2014-02-08 DIAGNOSIS — H6120 Impacted cerumen, unspecified ear: Secondary | ICD-10-CM | POA: Diagnosis not present

## 2014-02-08 DIAGNOSIS — H905 Unspecified sensorineural hearing loss: Secondary | ICD-10-CM | POA: Diagnosis not present

## 2014-02-23 DIAGNOSIS — R972 Elevated prostate specific antigen [PSA]: Secondary | ICD-10-CM | POA: Diagnosis not present

## 2014-02-23 DIAGNOSIS — R339 Retention of urine, unspecified: Secondary | ICD-10-CM | POA: Diagnosis not present

## 2014-03-02 DIAGNOSIS — C679 Malignant neoplasm of bladder, unspecified: Secondary | ICD-10-CM | POA: Diagnosis not present

## 2014-03-02 DIAGNOSIS — N138 Other obstructive and reflux uropathy: Secondary | ICD-10-CM | POA: Diagnosis not present

## 2014-03-02 DIAGNOSIS — N401 Enlarged prostate with lower urinary tract symptoms: Secondary | ICD-10-CM | POA: Diagnosis not present

## 2014-03-02 DIAGNOSIS — Z8551 Personal history of malignant neoplasm of bladder: Secondary | ICD-10-CM | POA: Diagnosis not present

## 2014-03-02 DIAGNOSIS — N139 Obstructive and reflux uropathy, unspecified: Secondary | ICD-10-CM | POA: Diagnosis not present

## 2014-03-10 DIAGNOSIS — L570 Actinic keratosis: Secondary | ICD-10-CM | POA: Diagnosis not present

## 2014-03-10 DIAGNOSIS — D239 Other benign neoplasm of skin, unspecified: Secondary | ICD-10-CM | POA: Diagnosis not present

## 2014-03-10 DIAGNOSIS — L821 Other seborrheic keratosis: Secondary | ICD-10-CM | POA: Diagnosis not present

## 2014-03-10 DIAGNOSIS — Z85828 Personal history of other malignant neoplasm of skin: Secondary | ICD-10-CM | POA: Diagnosis not present

## 2014-07-06 DIAGNOSIS — N139 Obstructive and reflux uropathy, unspecified: Secondary | ICD-10-CM | POA: Diagnosis not present

## 2014-07-06 DIAGNOSIS — N401 Enlarged prostate with lower urinary tract symptoms: Secondary | ICD-10-CM | POA: Diagnosis not present

## 2014-07-06 DIAGNOSIS — N138 Other obstructive and reflux uropathy: Secondary | ICD-10-CM | POA: Diagnosis not present

## 2014-07-13 DIAGNOSIS — C679 Malignant neoplasm of bladder, unspecified: Secondary | ICD-10-CM | POA: Diagnosis not present

## 2014-07-13 DIAGNOSIS — R339 Retention of urine, unspecified: Secondary | ICD-10-CM | POA: Diagnosis not present

## 2014-07-13 DIAGNOSIS — N138 Other obstructive and reflux uropathy: Secondary | ICD-10-CM | POA: Diagnosis not present

## 2014-07-13 DIAGNOSIS — N401 Enlarged prostate with lower urinary tract symptoms: Secondary | ICD-10-CM | POA: Diagnosis not present

## 2014-07-13 DIAGNOSIS — R82998 Other abnormal findings in urine: Secondary | ICD-10-CM | POA: Diagnosis not present

## 2014-07-13 DIAGNOSIS — R972 Elevated prostate specific antigen [PSA]: Secondary | ICD-10-CM | POA: Diagnosis not present

## 2014-08-11 DIAGNOSIS — N390 Urinary tract infection, site not specified: Secondary | ICD-10-CM | POA: Diagnosis not present

## 2014-09-01 DIAGNOSIS — H02844 Edema of left upper eyelid: Secondary | ICD-10-CM | POA: Diagnosis not present

## 2014-09-01 DIAGNOSIS — H02403 Unspecified ptosis of bilateral eyelids: Secondary | ICD-10-CM | POA: Diagnosis not present

## 2014-09-01 DIAGNOSIS — H43812 Vitreous degeneration, left eye: Secondary | ICD-10-CM | POA: Diagnosis not present

## 2014-09-01 DIAGNOSIS — Z9889 Other specified postprocedural states: Secondary | ICD-10-CM | POA: Diagnosis not present

## 2014-09-01 DIAGNOSIS — H0289 Other specified disorders of eyelid: Secondary | ICD-10-CM | POA: Diagnosis not present

## 2014-09-01 DIAGNOSIS — F17200 Nicotine dependence, unspecified, uncomplicated: Secondary | ICD-10-CM | POA: Diagnosis not present

## 2014-09-08 ENCOUNTER — Other Ambulatory Visit: Payer: Self-pay | Admitting: Pulmonary Disease

## 2014-10-21 DIAGNOSIS — Z23 Encounter for immunization: Secondary | ICD-10-CM | POA: Diagnosis not present

## 2014-10-31 DIAGNOSIS — Z6832 Body mass index (BMI) 32.0-32.9, adult: Secondary | ICD-10-CM | POA: Diagnosis not present

## 2014-10-31 DIAGNOSIS — H6123 Impacted cerumen, bilateral: Secondary | ICD-10-CM | POA: Diagnosis not present

## 2014-11-22 DIAGNOSIS — R339 Retention of urine, unspecified: Secondary | ICD-10-CM | POA: Diagnosis not present

## 2015-02-17 DIAGNOSIS — N401 Enlarged prostate with lower urinary tract symptoms: Secondary | ICD-10-CM | POA: Diagnosis not present

## 2015-02-17 DIAGNOSIS — R972 Elevated prostate specific antigen [PSA]: Secondary | ICD-10-CM | POA: Diagnosis not present

## 2015-02-21 DIAGNOSIS — C678 Malignant neoplasm of overlapping sites of bladder: Secondary | ICD-10-CM | POA: Diagnosis not present

## 2015-02-21 DIAGNOSIS — N390 Urinary tract infection, site not specified: Secondary | ICD-10-CM | POA: Diagnosis not present

## 2015-02-21 DIAGNOSIS — R3916 Straining to void: Secondary | ICD-10-CM | POA: Diagnosis not present

## 2015-02-21 DIAGNOSIS — N401 Enlarged prostate with lower urinary tract symptoms: Secondary | ICD-10-CM | POA: Diagnosis not present

## 2015-02-21 DIAGNOSIS — N5201 Erectile dysfunction due to arterial insufficiency: Secondary | ICD-10-CM | POA: Diagnosis not present

## 2015-03-01 ENCOUNTER — Telehealth: Payer: Self-pay | Admitting: Pulmonary Disease

## 2015-03-01 MED ORDER — AMLODIPINE BESYLATE 10 MG PO TABS: 10.0000 mg | ORAL_TABLET | Freq: Every day | ORAL | Status: DC

## 2015-03-01 MED ORDER — METOPROLOL SUCCINATE ER 50 MG PO TB24: 50.0000 mg | ORAL_TABLET | Freq: Every day | ORAL | Status: DC

## 2015-03-01 MED ORDER — LISINOPRIL 20 MG PO TABS: 20.0000 mg | ORAL_TABLET | Freq: Every day | ORAL | Status: DC

## 2015-03-01 MED ORDER — SIMVASTATIN 40 MG PO TABS: 40.0000 mg | ORAL_TABLET | Freq: Every day | ORAL | Status: DC

## 2015-03-01 NOTE — Telephone Encounter (Signed)
Pt needs refills on Simvastatin, Metoprolol, Amlodipine and Lisinopril. Advised him that he is overdue for an appointment and we will need to check with SN to see if he will continue seeing him for primary care.  SN - please advise. Thanks.

## 2015-03-01 NOTE — Telephone Encounter (Signed)
Message printed and placed on  SN cart for review  

## 2015-03-01 NOTE — Telephone Encounter (Signed)
Per SN, ok to refill medications and schedule pt for office visit. Pt called and informed of this . Office visit scheduled for 03-07-14 at [facility] sent to pharmacy. Nothing further is needed at this time.

## 2015-03-08 ENCOUNTER — Other Ambulatory Visit (INDEPENDENT_AMBULATORY_CARE_PROVIDER_SITE_OTHER): Payer: Medicare Other

## 2015-03-08 ENCOUNTER — Ambulatory Visit (INDEPENDENT_AMBULATORY_CARE_PROVIDER_SITE_OTHER): Payer: Medicare Other | Admitting: Pulmonary Disease

## 2015-03-08 ENCOUNTER — Encounter: Payer: Self-pay | Admitting: Pulmonary Disease

## 2015-03-08 ENCOUNTER — Ambulatory Visit (INDEPENDENT_AMBULATORY_CARE_PROVIDER_SITE_OTHER)
Admission: RE | Admit: 2015-03-08 | Discharge: 2015-03-08 | Disposition: A | Discharge status: Home / Self Care | Payer: Medicare Other | Source: Ambulatory Visit | Attending: Pulmonary Disease | Admitting: Pulmonary Disease

## 2015-03-08 VITALS — BP 140/80 | HR 71 | Temp 97.0°F | Wt 216.0 lb

## 2015-03-08 DIAGNOSIS — E7800 Pure hypercholesterolemia, unspecified: Secondary | ICD-10-CM

## 2015-03-08 DIAGNOSIS — F419 Anxiety disorder, unspecified: Secondary | ICD-10-CM

## 2015-03-08 DIAGNOSIS — M150 Primary generalized (osteo)arthritis: Secondary | ICD-10-CM

## 2015-03-08 DIAGNOSIS — N401 Enlarged prostate with lower urinary tract symptoms: Secondary | ICD-10-CM

## 2015-03-08 DIAGNOSIS — R0602 Shortness of breath: Secondary | ICD-10-CM | POA: Diagnosis not present

## 2015-03-08 DIAGNOSIS — C679 Malignant neoplasm of bladder, unspecified: Secondary | ICD-10-CM

## 2015-03-08 DIAGNOSIS — J410 Simple chronic bronchitis: Secondary | ICD-10-CM

## 2015-03-08 DIAGNOSIS — D126 Benign neoplasm of colon, unspecified: Secondary | ICD-10-CM

## 2015-03-08 DIAGNOSIS — I10 Essential (primary) hypertension: Secondary | ICD-10-CM

## 2015-03-08 DIAGNOSIS — N138 Other obstructive and reflux uropathy: Secondary | ICD-10-CM

## 2015-03-08 DIAGNOSIS — R05 Cough: Secondary | ICD-10-CM | POA: Diagnosis not present

## 2015-03-08 DIAGNOSIS — M159 Polyosteoarthritis, unspecified: Secondary | ICD-10-CM

## 2015-03-08 DIAGNOSIS — K5730 Diverticulosis of large intestine without perforation or abscess without bleeding: Secondary | ICD-10-CM

## 2015-03-08 DIAGNOSIS — I872 Venous insufficiency (chronic) (peripheral): Secondary | ICD-10-CM

## 2015-03-08 LAB — CBC WITH DIFFERENTIAL/PLATELET
BASOS PCT: 0.4 % (ref 0.0–3.0)
Basophils Absolute: 0 10*3/uL (ref 0.0–0.1)
Eosinophils Absolute: 0.1 10*3/uL (ref 0.0–0.7)
Eosinophils Relative: 1.5 % (ref 0.0–5.0)
HEMATOCRIT: 47.9 % (ref 39.0–52.0)
HEMOGLOBIN: 16.5 g/dL (ref 13.0–17.0)
LYMPHS ABS: 2.1 10*3/uL (ref 0.7–4.0)
LYMPHS PCT: 27.2 % (ref 12.0–46.0)
MCHC: 34.4 g/dL (ref 30.0–36.0)
MCV: 91.3 fl (ref 78.0–100.0)
MONO ABS: 0.6 10*3/uL (ref 0.1–1.0)
Monocytes Relative: 8.4 % (ref 3.0–12.0)
Neutro Abs: 4.8 10*3/uL (ref 1.4–7.7)
Neutrophils Relative %: 62.5 % (ref 43.0–77.0)
Platelets: 262 10*3/uL (ref 150.0–400.0)
RBC: 5.24 Mil/uL (ref 4.22–5.81)
RDW: 13.8 % (ref 11.5–15.5)
WBC: 7.7 10*3/uL (ref 4.0–10.5)

## 2015-03-08 LAB — LIPID PANEL
Cholesterol: 165 mg/dL (ref 0–200)
HDL: 60.1 mg/dL (ref >39.00)
LDL Cholesterol: 89 mg/dL (ref 0–99)
NONHDL: 104.9
Total CHOL/HDL Ratio: 3
Triglycerides: 80 mg/dL (ref 0.0–149.0)
VLDL: 16 mg/dL (ref 0.0–40.0)

## 2015-03-08 LAB — BASIC METABOLIC PANEL
BUN: 16 mg/dL (ref 6–23)
CALCIUM: 9.5 mg/dL (ref 8.4–10.5)
CO2: 30 mEq/L (ref 19–32)
CREATININE: 1.56 mg/dL — AB (ref 0.40–1.50)
Chloride: 102 mEq/L (ref 96–112)
GFR: 46.37 mL/min — ABNORMAL LOW (ref >60.00)
Glucose, Bld: 100 mg/dL — ABNORMAL HIGH (ref 70–99)
Potassium: 4 mEq/L (ref 3.5–5.1)
Sodium: 137 mEq/L (ref 135–145)

## 2015-03-08 LAB — HEPATIC FUNCTION PANEL
ALT: 7 U/L (ref 0–53)
AST: 14 U/L (ref 0–37)
Albumin: 4.1 g/dL (ref 3.5–5.2)
Alkaline Phosphatase: 54 U/L (ref 39–117)
BILIRUBIN DIRECT: 0.2 mg/dL (ref 0.0–0.3)
TOTAL PROTEIN: 6.9 g/dL (ref 6.0–8.3)
Total Bilirubin: 0.8 mg/dL (ref 0.2–1.2)

## 2015-03-08 LAB — TSH: TSH: 0.68 u[IU]/mL (ref 0.35–4.50)

## 2015-03-08 NOTE — Patient Instructions (Signed)
Today we updated your med list in our EPIC system...    Continue your current medications the same...  Today we did your follow up CXR & FASTING blood work...    We will contact you w/ the results when available...   Please quit the smoking!!!  Add the OTC MUCINEX 600mg - 1to2 tabs twice daily as needed for the cough & phlegm...  Call for any questions...  Let's plan a follow up visit in 56yr, sooner if needed for any problems.Marland KitchenMarland Kitchen

## 2015-03-08 NOTE — Progress Notes (Signed)
Subjective:     Patient ID: Jeremy [name], male   DOB: Feb 10, 1941, 74 y.o.   MRN: [id number]  HPI 74 y/o WM here for a follow up visit... he has multiple medical problems as noted below...     CXR 3/13 showed normal heart size, COPD w/ hyperinflation, clear/ NAD.Marland KitchenMarland Kitchen  EKG 3/13 showed NSR, rate66, ?LAE, otherw WNL.Marland KitchenMarland Kitchen  LABS 3/13:  FLP- at goals on Simva40;  Chems- wnl w/ BS110;  CBC- wnl;  TSH=0.88;  PSA=3.87  ~  January 25, 2013:  Yearly ROV & Jeremy [name] has had a good yr except for some recurrent UTIs which prompted a Urology f/u w/ [name] & [name]> eval revealed worsening BPH/ LUTS and PVR>600cc; started on Proscar & Flomax w/ improvement (voiding better, decr nocturia, stream improved)... We reviewed the following medical problems during today's office visit >>     Ex-cig smoker> he quit regular cigs >75yr ago & uses occas E-cig/ cigar; denies cough, sputum, SOB, & he remains active w/ walking & golfs regularly...    HBP> on MetopXL50, Norvasc10, Lisinopril20; BP= 128/68 & similar at home; denies CP/angina, palpit, SOB or edema...    Cholesterol> on Simva40; FLP 3/14 shows TChol 143, TG 44, HDL 57, LDL 78; continue same...    GI (divertics, polyps,hems) stable & f/u colon done 11/13 by Jeremy [name]- mod divertics, 2 sessile polyps w/ path revealing benign mucosa & f/u rec 184yr    GU- hx of Bladder Ca, BPH, UTIs, ED>  followed by Jeremy [name]/Jerem[name] & seen 1/14 w/ recurrent UTIs, signif PVR>600, & treated w/ Proscar5 & Flomax0.4 w/ sigif improvement in symptoms; cysto was otherw neg & PSA= 4.46 w/ 30%free...    Hx DJD & Vit D defic> followed by Jeremy [name] & he's taking 1000u VitD supplement... We reviewed prob list, meds, xrays and labs> see below for updates >>   CXR 3/14 showed norm heart size, clear lungs w/ min basilar scarring, NAD...  LABS 3/14:  FLP- at goals on Simva40 (contin same);  Chems- ok x BS=128 (needs better diet to avoid DM meds);  CBC- wnl;  TSH=0.75...   ~  September 21, 2013:  61m161moOV & post hosp check> Jeremy [name] Adm 9/18-19 w/ "indigestion" pain is epig/ lower chest wall w/ radiation to back; no assoc n/v, no SOB or palpit etc; this was followed by chills & sweat prompting call to EMS & taken to ER; the discomfort resolved w/ time & PPI rx;  Eval revealed a neg exam, clear CXR x bibasilar atx, norm EKG, neg cardiac enz, 2DEcho wnl x gr1DD;  He took the PPI for awhile then stopped on his own w/o recurrent symptoms- denies abd pain, dysphagia, reflux, n/v, c/d, etc;  After disch a urine cult ret pos for >100K Enterococcus Faecalis, sens Levaquin & we treated him x7d for this prob;  He has had f/u DrBorden, Urology & he reports difficulty emptying his bladder & meds adjusted- Finasteride5 + Tamsulosin0.4 and improving...   He is sched to have eyelid surg by Jeremy [name] at [name]/14...     We reviewed the following medical problems during today's office visit >> he had the 2014 Flu vaccine 9/14  EKG 9/14 showed NSR, rate60, WNL, NAD...  2DEcho 9/14 showed norm LV size & function w/ EF=55-60%, norm wall motion, Gr1DD, mild LA&RA dil, valves ok, RV ok...  ~  January 26, 2014:  25mo725mo & here for his annnual check-up> Jeremy [name] doing satis, he winters in [name]  no issues medically... He had Ectropion surg at [name] 11/14... He has hearing aides from [name]... We reviewed the following medical problems during today's office visit >>     Ex-cig smoker> he quit regular cigs >59yr ago & uses occas E-cig/ cigar; denies cough, sputum, SOB, & he remains active w/ walking & golfs regularly...    HBP> on MetopXL50, Norvasc10, Lisinopril20; BP= 110/68 & similar at home; denies CP/angina, palpit, SOB or edema...    Cholesterol> on Simva40; FLP 3/15 shows TChol 173, TG 103, HDL 56, LDL 97; continue same...    GI (divertics, polyps,hems) stable & f/u colon done 11/13 by Jeremy [name]- mod divertics, 2 sessile polyps w/ path revealing benign mucosa & f/u rec 160yr    GU- hx of Bladder Ca, BPH, UTIs, ED>   followed by Jeremy [name]/Jeremy [name] & seen 1/14 w/ recurrent UTIs, signif PVR>600, & treated w/ Proscar5 & Flomax0.4 w/ sigif improvement in symptoms; cysto was otherw neg & PSA= 4.46 w/ 30%free...    Hx DJD & Vit D defic> followed by Jeremy [name] & he's taking 1000u VitD supplement... We reviewed prob list, meds, xrays and labs> see below for updates >> Given Prevnar-13 today & Rx for Shingles shot...  LABS 3/15:  FLP- at goals on Simva40;  Chems- wnl;  CBC- wnl;  TSH=0.86...  ~  March 08, 2015:  1330moV & Jeremy [name] doing satis & no new complaints or concerns; [place] in [name]- he knows to avoid the sun, use sunscreen & he gets checke regularly by Derm-Jeremy [name];  He also gets checked Q6mo55moUrology-DrBorden & was seen 9/15 (note reviewed) and recently 4/16 (note pending)- he tells me that PSA was good & cysto was neg, they were concerned that he wasn't emptying his bladder well 7 increased his Flomax to 0.8;  Unfortunately Jeremy [name] smoking some- he's decr to 1/4ppd + Ecig & I implored him to quit completely... We reviewed the following medical problems during today's office visit >>     cig smoker> he has returned to smoking- now 1/4ppd + Ecig; he knows the dangers and risks!  He notes min cough & clear sputum, no hemoptysis, & denies SOB (remains active w/ walking & golfs regularly)...    HBP> on ASA81, MetopXL50, Norvasc10, Lisinopril20; BP= 140/80 & better at home; denies CP/angina, palpit, SOB or edema...    Cholesterol> on Simva40; FLP 4/16 shows TChol 165, TG 80, HDL 60, LDL 89; continue same...    GI- divertics, polyps,hems> stable & f/u colon done 11/13 by Jeremy [name]- mod divertics, 2 sessile polyps w/ path revealing benign mucosa & f/u rec 83yr74yr GU- hx of Bladder Ca, BPH, UTIs, ED>  followed by DrBorden & seen Q6mo 94mo& 4/16- note pending, pt reports PSA was good & Cysto- NEG; on Proscar5 & Flomax incr to 2 tabs daily for better emptying... Note- Cr=1.56,  copy to DrBorden, avoid NSAIDs, incr water intake...    Hx DJD & Vit D defic> followed by Jeremy [name] & he's taking 1000u VitD supplement... We reviewed prob list, meds, xrays and labs> see below for updates >>   CXR 4/16 showed norm heart size, clear lungs w/ mild bibasilar atx/ scarring, DJD Tspine, NAD...  LABS 4/16:  FLP- all at goals on Simva40;  Chems- ok x Cr=1.56;  CBC- wnl;  TSH=0.68...           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 ~  3/13: he tells me he quit regular cigs >71yrago & finally quit the E-cig 691mogo; admits to an occas cigar; denies cough, sputum, SOB, & he remains active w/ walking & golf... ~  3/14: he quit regular cigs >2y46yrgo & uses occas E-cig/ cigar; denies cough, sputum, SOB, & he remains active w/ walking & golfs regularly... ~  11/14: he has fallen off the wagon & was smoking again, quit again, & now back to Ecig; advised to quit completely, reminded about his bladder cancer & I told him lung ca is a lot worse! He declines smoking cessation help... ~  3/15: he admits to "v[place] the [place]; again he is asked to quit completely... ~  4/16: he is back to 1/4ppd + Ecig & asked to stop completely- he knows the risks etc...  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 uses Mucinex as needed. ~  CXR 11/09 showed mild COPD changes, NAD... Marland Kitchen  CXR 3/11 showed some scarring at the bases, no change... ~  CXR 3/12 showed chr changes, incr markings, basilar scarring, NAD. ~  CXR 3/13 showed normal heart size, COPD w/ hyperinflation, clear/ NAD... Marland Kitchen  CXR 3/14 showed norm heart size, clear lungs w/ min basilar scarring, NAD. ~  CXR 9/14 (hosp for atypCP, reflux & UTI) showed norm heart size, bibasilar atx, otherw clear, NAD... Marland Kitchen  CXR 4/16 showed norm heart size, clear lungs w/ mild bibasilar atx/ scarring, DJD Tspine, NAD  HYPERTENSION (ICD-401.9)  - on METOPROLOL XR 60m55m  AMLODIPINE 10mg22m LISINOPRIL 20mg/36m  he also takes ASA 81mg/d80mhe has seen DrWall ([facility]) in the past... BP= 134/60 & similar at home, takes meds regularly & tolerates well... denies HA, fatigue, visual changes, CP, palipit, dizziness, syncope, dyspnea, edema, etc...  ~  NuclearStressTest 8/07 showed a hypertensive response but no scar or ischemia, EF= 63%... no change from 2002 study... Toprol added... ~  EKG 9/14 showed NSR, rate60, WNL, NAD... ~  2DEcho 9/14 showed norm LV cavity size & wall thickness, norm wall motion & sys function w/ EF=55-60%, Gr1DD, mild RA&LA dil, valves looked ok... ~  3/15: on MetopXL50, Norvasc10, Lisinopril20; BP= 110/68 & similar at home; denies CP/angina, palpit, SOB or edema  ~  4/16: on ASA81, MetopXL50, Norvasc10, Lisinopril20; BP= 140/80 & better at home; denies CP/angina, palpit, SOB or edema.  VENOUS INSUFFICIENCY (ICD-459.81) - he has mod VI & intermittent min edema... he knows to elim salt, elevate, wear support hose, etc...  HYPERCHOLESTEROLEMIA (ICD-272.0) - on SIMVASTATIN 40mg/d.58m~  FLP 7/08[place] TChol 144, TG 64, HDL 41, LDL 90...  ~  FLP 12/0[place] TChol 173, TG 82, HDL 51, LDL 106... rec- same med + better diet. ~  FLP 3/11 showed TChol 158, TG 116, HDL 54, LDL 81 ~  FLP 3/12 on Simva40 showed TChol 155, TG 55, HDL 54, LDL 90 ~  FLP 3/13 on Simva40 showed TChol 162, TG 79, HDL 56, LDL 90 ~  FLP 3/14 on Simva40 showed TChol 143, TG 44, HDL 57, LDL 78 ~  FLP 9/14 in hosp showed TChol 154, TG 51, HDL 63, LDL 81... rec to continue Simva40. ~  FLP 3/15 on simva40 showed TChol 173, TG 103, HDL 56, LDL 97 ~  FLP 4/16 on Simva40 showed TChol 165, TG 80, HDL 60, LDL 89; continue same  OVERWEIGHT (ICD-278.02) -  ~  weight 11/09 = 212# since he cut way back on smoking... we discussed diet + exercise etc... ~  weight 3/11 = 207# ~  weight 3/12 = 211# ~  Weight 3/13 = 216# ~  Weight 3/14 = 211# ~  Weight 11/14 =  208# ~  Weight 3/15 = 214# ~  Weight 4/16 = 216#  DIVERTICULOSIS OF COLON (ICD-562.10),  COLONIC POLYPS (ICD-211.3),  & HEMORRHOIDS (ICD-455.6) -  - last colonoscopy 9/08 by Jeremy [name] showed several 3-71m polyps & divertics... path= adenomatous and f/u planned in 5 yrs... ~  F/u colonoscopy 11/13 by Jeremy [name] showed 2 sessile polyps in desc colon, mod divertics, path= mult fragments of benign polypoid colorectal mucosa  GALLSTONES (ICD-574.20) - seen on prev CT Abd done by Urology...  Repeated URINARY TRACT INFECTIONS >> had Enterococcus UTI 1/14 treated by Urology w/ Levaquin... 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 he gets cystoscopies less often now...BPH w/ mild obstructive symptoms... intermittently elevated PSA on testing in the past... ~  labs 3/11 showed PSA = 3.64 ~  labs 3/12 showed PSA= 3.98 ~  Labs 3/13 showed PSA= 3.87 ~  Urology f/u DrBorden 1/14> Hx UTIs, Bladder Cancer, BPH/LTOS, & ED- on Proscar5 & Flomax0.4; they did cysto (neg) & PVR was 600cc, symptoms better on Rx now; they did PSA 1/14= 4.46 (30%free)...  ~  9/14> hosp w/ epig & lower CP w/ rad to back; urine grew Enterococcus & treated again w/ Levaquin; Urology f/u DrBorden- hx bladder ca, neg cysto 1/14, BPH/LUTS w/ PVR>600 in past now improved on Finasteride & Flomax... ~  9/15: he had f/u DrBorden- urothelial ca of bladder, BPH/LUTS, ED, f/u PSA; felt to be stable... ~  4/16: he had 688moOV DrBorden & pt reports that PSA was good, & cysto was neg; they incr his Tamsulosin to 0.8 to help his bladder emptying...  DEGENERATIVE JOINT DISEASE (ICD-715.90) - eval for left knee pain by Jeremy [name] w/ torn cartilage & baker's cyst... knee tapped, given shot w/ relief...  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 ~  labs 11/09 showed Vit D level = 22... rec to start 2000 u daily. ~  labs 3/11 showed Vit D level = 50... continue supplemental Vit D at 1000 u daily.  Health Maintenance: ~  Immunizations:  he gets the seasonal flu vaccine yearly;  had the PNEUMOVAX 2009 (age66);   TDAP 3/11...   Past Surgical History  Procedure Laterality Date  . Tonsillectomy and adenoidectomy      Surg as a child...  . Skin cancer excision      Surg x2...  [name]...   Outpatient Encounter Prescriptions as of 03/08/2015  Medication Sig  . amLODipine (NORVASC) 10 MG tablet Take 1 tablet (10 mg total) by mouth daily.  . Marland Kitchenspirin 81 MG tablet Take 81 mg by mouth daily.   . beta carotene w/minerals (OCUVITE) tablet Take 1 tablet by mouth daily.  . finasteride (PROSCAR) 5 MG tablet Take 5 mg by mouth daily.  . Marland Kitchenisinopril (PRINIVIL,ZESTRIL) 20 MG tablet Take 1 tablet (20 mg total) by mouth daily.  . metoprolol succinate (TOPROL-XL) 50 MG 24 hr tablet Take 1 tablet (50 mg total) by mouth daily. Take with or  immediately following a meal.  . Multiple Vitamin (MULTIVITAMIN) capsule Take 1 capsule by mouth daily.  . simvastatin (ZOCOR) 40 MG tablet Take 1 tablet (40 mg total) by mouth daily.  . tamsulosin (FLOMAX) 0.4 MG CAPS Take 0.8 mg by mouth daily after supper.     Allergies  Allergen Reactions  . Sulfa Antibiotics     Rash, light headed, shaking  . Penicillins     REACTION: hives severe as a child    Current Medications, Allergies, Past Medical History, Past Surgical History, Family History, and Social History were reviewed in [employer] record.    Review of Systems    Constitutional:  Denies F/C/S, anorexia, unexpected weight change. HEENT:  No HA, visual changes, earache, nasal symptoms, sore throat, hoarseness. Resp:  No cough, sputum, hemoptysis; no SOB, tightness, wheezing. Cardio:  No CP, palpit, DOE, orthopnea,  edema. GI:  Denies N/V/D/C or blood in stool; no reflux, abd pain, distention, or gas. GU:  No dysuria, freq, urgency, hematuria, or flank pain. MS:  Denies joint pain, swelling, tenderness, or decr ROM; no neck pain, back pain, etc. Neuro:  No tremors, seizures, dizziness, syncope, weakness, numbness, gait abn. Skin:  No suspicious lesions or skin rash. Heme:  No adenopathy, bruising, bleeding. Psyche: Denies confusion, sleep disturbance, hallucinations, anxiety, depression.   Objective:   Physical Exam    WD, Overweight, 74 y/o WM in NAD... GENERAL:  Alert & oriented; pleasant & cooperative... HEENT:  [place]/AT, EOM-wnl, PERRLA, EACs-clear, TMs-wnl, NOSE-clear, THROAT-clear & wnl. NECK:  Supple w/ fairROM; no JVD; normal carotid impulses w/o bruits; no thyromegaly or nodules palpated; no lymphadenopathy. CHEST:  Clear to P & A; without wheezes/ rales/ or rhonchi heard... HEART:  Regular Rhythm; without murmurs/ rubs/ or gallops detected... ABDOMEN:  Soft & nontender; normal bowel sounds; no organomegaly or masses palpated... EXT: without deformities, mild arthritic changes; no varicose veins, +ven insuffic, tr edema... NEURO:  CN's intact; motor testing normal; sensory testing normal; gait normal & balance OK. DERM:  No lesions noted; no rash etc...  RADIOLOGY DATA:  Reviewed in the EPIC EMR & discussed w/ the patient...  LABORATORY DATA:  Reviewed in the EPIC EMR & discussed w/ the patient...     Assessment:      COPD/ smoker>  CXR w/ some bibasilar atx, but he is asymptomatic, denies cough/ SOB/ etc; needs to incr activity/ exercise & stay off the cigs!!!... 3/15>  Given Prevnar-13  HBP>  Controlled on his 3 meds, continue same...  CHOL>  FLP is at the goals on Simva40 + diet...  OVERWEIGHT>  We reviewed diet & exercise program...  GI> Divertics, Polyps>  He is up to date w/ screening colonoscopies; he uses the Protonix 62m prn....  Gallstones>  Asymptomatic stones seen on  prev scan...  BPH, Bladder cancer, Cr=1.56>  Prev Enterococcus UTI treated w/ Levaquin; followed by DrBorden & improved on Proscar/ Flomax0.8 now; he knows to avoid NSAIDs...  DJD>  Aware, he uses OTC meds (Tylenol)as needed...  Other medical issues as noted...      Plan:     Patient's Medications  New Prescriptions   No medications on file  Previous Medications   AMLODIPINE (NORVASC) 10 MG TABLET    Take 1 tablet (10 mg total) by mouth daily.   ASPIRIN 81 MG TABLET    Take 81 mg by mouth daily.    BETA CAROTENE W/MINERALS (OCUVITE) TABLET    Take 1 tablet by mouth daily.  FINASTERIDE (PROSCAR) 5 MG TABLET    Take 5 mg by mouth daily.   LISINOPRIL (PRINIVIL,ZESTRIL) 20 MG TABLET    Take 1 tablet (20 mg total) by mouth daily.   METOPROLOL SUCCINATE (TOPROL-XL) 50 MG 24 HR TABLET    Take 1 tablet (50 mg total) by mouth daily. Take with or immediately following a meal.   MULTIPLE VITAMIN (MULTIVITAMIN) CAPSULE    Take 1 capsule by mouth daily.   SIMVASTATIN (ZOCOR) 40 MG TABLET    Take 1 tablet (40 mg total) by mouth daily.   TAMSULOSIN (FLOMAX) 0.4 MG CAPS    Take 0.8 mg by mouth daily after supper.   Modified Medications   No medications on file  Discontinued Medications   No medications on file

## 2015-03-10 ENCOUNTER — Telehealth: Payer: Self-pay | Admitting: Pulmonary Disease

## 2015-03-10 NOTE — Telephone Encounter (Signed)
Discussed results with patient. Nothing further needed.

## 2015-03-30 DIAGNOSIS — L820 Inflamed seborrheic keratosis: Secondary | ICD-10-CM | POA: Diagnosis not present

## 2015-03-30 DIAGNOSIS — D225 Melanocytic nevi of trunk: Secondary | ICD-10-CM | POA: Diagnosis not present

## 2015-03-30 DIAGNOSIS — L821 Other seborrheic keratosis: Secondary | ICD-10-CM | POA: Diagnosis not present

## 2015-03-30 DIAGNOSIS — Z85828 Personal history of other malignant neoplasm of skin: Secondary | ICD-10-CM | POA: Diagnosis not present

## 2015-03-30 DIAGNOSIS — L570 Actinic keratosis: Secondary | ICD-10-CM | POA: Diagnosis not present

## 2015-03-30 DIAGNOSIS — Z86018 Personal history of other benign neoplasm: Secondary | ICD-10-CM | POA: Diagnosis not present

## 2015-04-20 ENCOUNTER — Telehealth: Payer: Self-pay | Admitting: Pulmonary Disease

## 2015-04-20 DIAGNOSIS — I872 Venous insufficiency (chronic) (peripheral): Secondary | ICD-10-CM

## 2015-04-20 NOTE — Telephone Encounter (Signed)
Called spoke with pt. He reports he spoke with SN at last OV about weakness in legs that goes down into ankles. He reports it is not getting better. He wants a referral to see someone for this. Please advise thanks

## 2015-04-21 NOTE — Telephone Encounter (Signed)
Per SN, order for neurology referral with Dr Posey Pronto was placed for increased weakness in legs. Pt was notified of referral and informed that he would be contacted by [facility] of date and time. Pt was instructed to call office back in weakness in legs gets worse. Nothing further is needed.

## 2015-04-28 ENCOUNTER — Ambulatory Visit (INDEPENDENT_AMBULATORY_CARE_PROVIDER_SITE_OTHER): Payer: Medicare Other | Admitting: Neurology

## 2015-04-28 ENCOUNTER — Encounter: Payer: Self-pay | Admitting: Neurology

## 2015-04-28 VITALS — BP 120/80 | HR 68 | Ht 70.0 in | Wt 206.4 lb

## 2015-04-28 DIAGNOSIS — R29898 Other symptoms and signs involving the musculoskeletal system: Secondary | ICD-10-CM

## 2015-04-28 DIAGNOSIS — M4806 Spinal stenosis, lumbar region: Secondary | ICD-10-CM | POA: Diagnosis not present

## 2015-04-28 DIAGNOSIS — M4807 Spinal stenosis, lumbosacral region: Secondary | ICD-10-CM

## 2015-04-28 DIAGNOSIS — G621 Alcoholic polyneuropathy: Secondary | ICD-10-CM | POA: Diagnosis not present

## 2015-04-28 NOTE — Patient Instructions (Addendum)
1.  Start physical therapy 2.  Strongly encouraged to stop smoking 3.  Cut back on alcohol intake 4.  Return clinic 3 months

## 2015-04-28 NOTE — Progress Notes (Signed)
[facility] Clinic Note - Initial Visit   Date: 04/29/2015   Jeremy [name] MRN: [id number] DOB: [birth year]   Dear Dr. Lenna Gilford:  Thank you for your kind referral of Jeremy [name]. Tat for consultation of bilateral leg weakness. Although his history is well known to you, please allow [place] to reiterate it for the purpose of our medical record. The patient was accompanied to the clinic by wife who also provides collateral information.     History of Present Illness: Jeremy [name] is a 74 y.o. Caucasian male with hypertension and hyperlipidemia presenting for evaluation of bilateral leg weakness.    Starting 2014, he was found to have a baker's cyst had it drained. Six months later, he developed weakness of his legs and lower leg pain.  He was reevaluated by his orthopeadic provider who noted no abnormalities of the knees.  Over the past several month, he has developed greater difficulty getting out of a low chair and climbing stairs.  No knee pain.  In the morning, he is feeling well but after prolonged sitting (sitting in the car), legs are weak and he has achy pain involving his back and legs.   He endorses numbness/tingling of the lower legs, worse on the left. Balance is good.  Shoe inserts have helped.  He also reports to drinking 2-3 glasses of wine daily for the past 10-15 years and is an active smoker.     Out-side paper records, electronic medical record, and images have been reviewed where available and summarized as:  Lab Results  Component Value Date   TSH 0.68 03/08/2015   Lab Results  Component Value Date   CHOL 165 03/08/2015   HDL 60.10 03/08/2015   LDLCALC 89 03/08/2015   TRIG 80.0 03/08/2015   CHOLHDL 3 03/08/2015      Past Medical History  Diagnosis Date  . Hyperlipidemia   . Hypertension     Past Surgical History  Procedure Laterality Date  . Tonsillectomy and adenoidectomy      Surg as a child...  . Skin cancer excision     Surg x2...     Medications:  Current Outpatient Prescriptions on File Prior to Visit  Medication Sig Dispense Refill  . amLODipine (NORVASC) 10 MG tablet Take 1 tablet (10 mg total) by mouth daily. 90 tablet 1  . aspirin 81 MG tablet Take 81 mg by mouth daily.     . beta carotene w/minerals (OCUVITE) tablet Take 1 tablet by mouth daily.    . finasteride (PROSCAR) 5 MG tablet Take 5 mg by mouth daily.    Marland Kitchen lisinopril (PRINIVIL,ZESTRIL) 20 MG tablet Take 1 tablet (20 mg total) by mouth daily. 90 tablet 1  . metoprolol succinate (TOPROL-XL) 50 MG 24 hr tablet Take 1 tablet (50 mg total) by mouth daily. Take with or immediately following a meal. 90 tablet 1  . Multiple Vitamin (MULTIVITAMIN) capsule Take 1 capsule by mouth daily.    . simvastatin (ZOCOR) 40 MG tablet Take 1 tablet (40 mg total) by mouth daily. 90 tablet 1  . tamsulosin (FLOMAX) 0.4 MG CAPS Take 0.8 mg by mouth daily after supper.      No current facility-administered medications on file prior to visit.    Allergies:  Allergies  Allergen Reactions  . Sulfa Antibiotics     Rash, light headed, shaking  . Penicillins     REACTION: hives severe as a child    Family History: Family History  Problem Relation Age of Onset  . Colon cancer Neg Hx   . Stomach cancer Neg Hx   . Rectal cancer Neg Hx     Social History: History   Social History  . Marital Status: Married    Spouse Name: N/A  . Number of Children: N/A  . Years of Education: N/A   Occupational History  . Not on file.   Social History Main Topics  . Smoking status: Former Smoker -- 0.80 packs/day for 50 years    Types: Cigarettes    Quit date: 01/26/2013  . Smokeless tobacco: Never Used     Comment: uses vapor cigarette  . Alcohol Use: 12.6 oz/week    21 Glasses of wine per week  . Drug Use: No  . Sexual Activity: Not on file   Other Topics Concern  . Not on file   Social History Narrative   Lives with wife in a 2 story home.  Has 2  children.  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   EYES: No visual changes or eye pain ENT: No hearing changes.  No history of nose bleeds.   RESPIRATORY: No cough, wheezing and shortness of breath.   CARDIOVASCULAR: Negative for chest pain, and palpitations.   GI: Negative for abdominal discomfort, blood in stools or black stools.  No recent change in bowel habits. GU:  No history of incontinence.   MUSCLOSKELETAL: No history of joint pain or swelling.  No myalgias.   SKIN: Negative for lesions, rash, and itching.   HEMATOLOGY/ONCOLOGY: Negative for prolonged bleeding, bruising easily, and swollen nodes.  No history of cancer.   ENDOCRINE: Negative for cold or heat intolerance, polydipsia or goiter.   PSYCH:  No depression or anxiety symptoms.   NEURO: As Above.   Vital Signs:  BP 120/80 mmHg  Pulse 68  Ht 5\' 10"  (1.778 m)  Wt 206 lb 6 oz (93.611 kg)  BMI 29.61 kg/m2  SpO2 94%   General Medical Exam:   General:  Well appearing, comfortable.   Eyes/ENT: see cranial nerve examination.   Neck: No masses appreciated.  Full range of motion without tenderness.  No carotid bruits. Respiratory:  Clear to auscultation, good air entry bilaterally.   Cardiac:  Regular rate and rhythm, no murmur.   Extremities:  No deformities, edema, or skin discoloration.  Skin:  No rashes or lesions.  Neurological Exam: MENTAL STATUS including orientation to time, place, person, recent and remote memory, attention span and concentration, language, and fund of knowledge is normal.  Speech is not dysarthric.  CRANIAL NERVES: II:  No visual field defects.  Unremarkable fundi.   III-IV-VI: Pupils equal round and reactive to light.  Normal conjugate, extra-ocular eye movements in all directions of gaze.  No nystagmus.  No ptosis.   V:  Normal facial sensation.   VII:  Normal facial symmetry and movements.   No pathologic facial reflexes.  VIII:  Normal hearing and vestibular function.   IX-X:  Normal palatal movement.   XI:  Normal shoulder shrug and head rotation.   XII:  Normal tongue strength and range of motion, no deviation or fasciculation.  MOTOR:  No atrophy, fasciculations or abnormal movements.  No pronator drift.  Tone is normal.    Right Upper Extremity:    Left Upper Extremity:    Deltoid  5/5   Deltoid  5/5   Biceps  5/5   Biceps  5/5   Triceps  5/5   Triceps  5/5   Wrist extensors  5/5   Wrist extensors  5/5   Wrist flexors  5/5   Wrist flexors  5/5   Finger extensors  5/5   Finger extensors  5/5   Finger flexors  5/5   Finger flexors  5/5   Dorsal interossei  5/5   Dorsal interossei  5/5   Abductor pollicis  5/5   Abductor pollicis  5/5   Tone (Ashworth scale)  0  Tone (Ashworth scale)  0   Right Lower Extremity:    Left Lower Extremity:    Hip flexors  5-/5   Hip flexors  5-/5   Adductor 5-/5  Adductor 5-/5  Hip extensors  5/5   Hip extensors  5/5   Knee flexors  5/5   Knee flexors  5/5   Knee extensors  5/5   Knee extensors  5/5   Dorsiflexors  5/5   Dorsiflexors  5/5   Plantarflexors  5/5   Plantarflexors  5/5   Toe extensors  5/5   Toe extensors  5/5   Toe flexors  5/5   Toe flexors  5/5   Tone (Ashworth scale)  0  Tone (Ashworth scale)  0   MSRs:  Right                                                                 Left brachioradialis 2+  brachioradialis 2+  biceps 2+  biceps 2+  triceps 2+  triceps 2+  patellar 2+  patellar 2+  ankle jerk 0  ankle jerk 0  Hoffman no  Hoffman no  plantar response down  plantar response down   SENSORY:  Vibration, temperature, and pin prick reduced following a gradient pattern in the lower legs.  Proprioception intact.  Romberg's sign absent.   COORDINATION/GAIT: Normal finger-to- nose-finger.  Intact rapid alternating movements bilaterally.  Able to rise from a chair without using arms.  Gait mildly wide-based and  stable. Tandem and stressed gait intact.    IMPRESSION: 1.  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  Greater than 50% of this time was spent in counseling, explanation of diagnosis, planning of further management, and coordination of care.   Thank you for allowing me to participate in patient's care.  If I can answer any additional questions, I would be pleased to do so.    Sincerely,    Dovid Bartko K. Posey Pronto, DO

## 2015-05-11 ENCOUNTER — Ambulatory Visit: Payer: Medicare Other | Attending: Neurology | Admitting: Rehabilitation

## 2015-05-11 DIAGNOSIS — R269 Unspecified abnormalities of gait and mobility: Secondary | ICD-10-CM

## 2015-05-11 DIAGNOSIS — R2681 Unsteadiness on feet: Secondary | ICD-10-CM | POA: Diagnosis not present

## 2015-05-11 DIAGNOSIS — R29898 Other symptoms and signs involving the musculoskeletal system: Secondary | ICD-10-CM | POA: Diagnosis not present

## 2015-05-11 DIAGNOSIS — M79605 Pain in left leg: Secondary | ICD-10-CM

## 2015-05-11 NOTE — Therapy (Signed)
[address] [place], [place], [address] Phone: [phone]   Fax:  [phone]  Physical Therapy Evaluation  Patient Details  Name: Jeremy [name]. [place] MRN: [id number] Date of Birth: 08-25-1941 Referring Provider:  Alda Berthold, DO  Encounter Date: 05/11/2015      PT End of Session - 05/11/15 0926    Visit Number 1   PT Start Time 0830   PT Stop Time 0915   PT Time Calculation (min) 45 min      Past Medical History  Diagnosis Date  . Hyperlipidemia   . Hypertension     Past Surgical History  Procedure Laterality Date  . Tonsillectomy and adenoidectomy      Surg as a child...  . Skin cancer excision      Surg x2...    There were no vitals filed for this visit.  Visit Diagnosis:  Weakness of both legs - Plan: PT plan of care cert/re-cert  Abnormality of gait - Plan: PT plan of care cert/re-cert  Unsteadiness on feet - Plan: PT plan of care cert/re-cert  Pain of left lower extremity - Plan: PT plan of care cert/re-cert      Subjective Assessment - 05/11/15 0841    Subjective Pt. reports BLE weakness/tingling in lower legs and feet progressively worse over the last 8 mo to 1 yr.   No apparent reason.   Neuro consult dx is peripheral neuropathy and L3-4 radiculopathy.   He is to come for PT for strengthening and return for possible lumbar MRI later.      How long can you sit comfortably? after 1-2 hrs has leg heaviness, weakness, tingling   How long can you stand comfortably? does not affect pain   How long can you walk comfortably? better with movement but at end of day BLE are weaker and has difficulty going down stairs   Patient Stated Goals have normal strength in BLE and be painfree   Currently in Pain? Yes   Pain Score 6    Pain Descriptors / Indicators Aching;Pins and needles;Numbness   Pain Type Chronic pain   Pain Onset More than a month ago   Pain Frequency Intermittent    Aggravating Factors  sitting long periods, being inactive   Pain Relieving Factors ankle pumps and getting up and walking   Effect of Pain on Daily Activities difficulty sitting long periods, driving            [facility] PT Assessment - 05/11/15 0001    Assessment   Medical Diagnosis large fiber peripheral neuropathy, foraminal stenosis lumbosacral region   Onset Date/Surgical Date 08/10/14   Prior Therapy none   Balance Screen   Has the patient fallen in the past 6 months No   Has the patient had a decrease in activity level because of a fear of falling?  Yes   Is the patient reluctant to leave their home because of a fear of falling?  No   ROM / Strength   AROM / PROM / Strength Strength   Strength   Strength Assessment Site Hip;Knee;Ankle   Right/Left Hip Right;Left   Right Hip Flexion 4/5   Right Hip External Rotation  4   Right Hip Internal Rotation  5   Left Hip Flexion 4/5   Left Hip External Rotation  4   Right/Left Knee Right;Left   Right Knee Flexion 4-/5   Right Knee Extension 4+/5   Left Knee  Flexion 4/5   Left Knee Extension 4+/5   Right/Left Ankle Right;Left   Right Ankle Dorsiflexion 4+/5   Right Ankle Plantar Flexion 3/5   Left Ankle Dorsiflexion 4+/5   Left Ankle Plantar Flexion 3+/5   Ambulation/Gait   Gait Comments wide based   Standardized Balance Assessment   Standardized Balance Assessment Berg Balance Test   Berg Balance Test   Sit to Stand Able to stand without using hands and stabilize independently   Standing Unsupported Able to stand safely 2 minutes   Sitting with Back Unsupported but Feet Supported on Floor or Stool Able to sit safely and securely 2 minutes   Stand to Sit Sits safely with minimal use of hands   Transfers Able to transfer safely, minor use of hands   Standing Unsupported with Eyes Closed Able to stand 10 seconds safely   Standing Ubsupported with Feet Together Able to place feet together independently and stand 1 minute safely    From Standing, Reach Forward with Outstretched Arm Can reach confidently >25 cm (10")   From Standing Position, Pick up Object from Floor Able to pick up shoe safely and easily   From Standing Position, Turn to Look Behind Over each Shoulder Looks behind from both sides and weight shifts well   Turn 360 Degrees Able to turn 360 degrees safely in 4 seconds or less   Standing Unsupported, Alternately Place Feet on Step/Stool Able to stand independently and safely and complete 8 steps in 20 seconds   Standing Unsupported, One Foot in [employer] balance while stepping or standing   Standing on One Leg Unable to try or needs assist to prevent fall   Total Score 48                           PT Education - 05/11/15 0925    Education provided Yes   Education Details corner balance program:   toe raise, heel raise, feet together/eyes closed balance, tandem stand, SLS   Person(s) Educated Patient   Methods Explanation;Demonstration;Tactile cues;Verbal cues   Comprehension Verbal cues required;Tactile cues required;Need further instruction          PT Short Term Goals - 05/11/15 1114    PT SHORT TERM GOAL #1   Title Pt. will be independent with HEP   Baseline 4   Period Weeks   Status New   PT SHORT TERM GOAL #2   Title Pt. will improve ankle plantar flexion strength to 4/5   Time 4   Period Weeks   Status New           PT Long Term Goals - 05/11/15 1116    PT LONG TERM GOAL #1   Title Berg score will improve to 52 to decrease fall risk without an assistive device   Time 8   Period Weeks   Status New   PT LONG TERM GOAL #2   Title Pt.  will report able to go up/down steps at the end of day without difficulty   Time 8   Period Weeks   Status New               Plan - 05/11/15 0930    Clinical Impression Statement Pt. presents with c/o BLE distal aching/tingling/tired/weak feeling intermittently that is worse with dependent positions for long  periods relieved by activity.   He has hemosiderin staining BLE as well.    There is also weakness  throughout BLE.    Light touch sensation is poor L foot more than R.     Advised pt. we will hopefully improve his strength and balance.  It is questionable if his discomfort/pain will improve, though.   Pt will benefit from skilled therapeutic intervention in order to improve on the following deficits Abnormal gait;Pain;Decreased strength;Difficulty walking;Decreased activity tolerance   Rehab Potential Good   PT Frequency 2x / week   PT Duration 8 weeks   PT Treatment/Interventions Therapeutic activities;Therapeutic exercise;Balance training;Neuromuscular re-education;Gait training;Patient/family education   PT Next Visit Plan instruct pt. in gym program for him to continue at [facility] [employer] [facility],  progress balance and strengthening   PT Home Exercise Plan corner balance and strengthening   Consulted and Agree with Plan of Care Patient         Problem List Patient Active Problem List   Diagnosis Date Noted  . UTI (urinary tract infection) 09/21/2013  . VITAMIN D DEFICIENCY 01/29/2010  . Osteoarthritis 01/29/2010  . 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 LEG EDEMA 10/13/2009  . OVERWEIGHT 11/20/2008  . GALLSTONES 11/20/2008  . Malignant neoplasm of bladder 10/10/2008  . Chronic bronchitis 10/10/2008  . Diverticulosis of large intestine 10/10/2008  . COLONIC POLYPS 12/07/2007  . HYPERCHOLESTEROLEMIA 12/07/2007  . Essential hypertension 12/07/2007  . HEMORRHOIDS 12/07/2007  . 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

## 2015-05-19 ENCOUNTER — Ambulatory Visit: Payer: Medicare Other | Admitting: Rehabilitation

## 2015-05-23 ENCOUNTER — Ambulatory Visit: Payer: Medicare Other | Attending: Neurology | Admitting: Physical Therapy

## 2015-05-23 ENCOUNTER — Encounter: Payer: Self-pay | Admitting: Physical Therapy

## 2015-05-23 DIAGNOSIS — R2681 Unsteadiness on feet: Secondary | ICD-10-CM | POA: Diagnosis not present

## 2015-05-23 DIAGNOSIS — R29898 Other symptoms and signs involving the musculoskeletal system: Secondary | ICD-10-CM

## 2015-05-23 DIAGNOSIS — R269 Unspecified abnormalities of gait and mobility: Secondary | ICD-10-CM | POA: Diagnosis not present

## 2015-05-23 DIAGNOSIS — M79605 Pain in left leg: Secondary | ICD-10-CM

## 2015-05-23 NOTE — Therapy (Signed)
[address] [place], [place], [address] Phone: [phone]   Fax:  [phone]  Physical Therapy Treatment  Patient Details  Name: Jeremy [name] MRN: [id number] Date of Birth: [birth year] Referring Provider:  Alda Berthold, DO  Encounter Date: 05/23/2015      PT End of Session - 05/23/15 0911    Visit Number 2   PT Start Time 0825   PT Stop Time 0904   PT Time Calculation (min) 39 min   Activity Tolerance Patient tolerated treatment well   Behavior During Therapy [facility] for tasks assessed/performed      Past Medical History  Diagnosis Date  . Hyperlipidemia   . Hypertension     Past Surgical History  Procedure Laterality Date  . Tonsillectomy and adenoidectomy      Surg as a child...  . Skin cancer excision      Surg x2...    There were no vitals filed for this visit.  Visit Diagnosis:  Abnormality of gait  Weakness of both legs  Unsteadiness on feet  Pain of left lower extremity      Subjective Assessment - 05/23/15 0824    Subjective Pt reports getting a compression sock that has helped a lot when driving.    How long can you sit comfortably? after 1-2 hrs has leg heaviness, weakness, tingling   How long can you stand comfortably? does not affect pain   How long can you walk comfortably? better with movement but at end of day BLE are weaker and has difficulty going down stairs   Patient Stated Goals have normal strength in BLE and be pain free   Currently in Pain? No/denies   Pain Score 0-No pain                         [facility] Adult PT Treatment/Exercise - 05/23/15 0001    High Level Balance   High Level Balance Activities Tandem walking   Knee/Hip Exercises: Machines for Strengthening   Cybex Knee Extension #25 10 reps 3 sets    Cybex Knee Flexion #35 10 reps 3 sets   Cybex Leg Press #60 10 reps 3 set    Knee/Hip Exercises: Standing   Heel Raises 2 sets;1  second  25 reps    Other Standing Knee Exercises Marching on airex 10 reps 3 sets    Other Standing Knee Exercises Standing rebound ball toss red weighted ball 15 reps 2 sets   Knee/Hip Exercises: Seated   Sit to Sand 10 reps;without UE support  3 sets, last set with yellow weighted ball                  PT Short Term Goals - 05/23/15 0915    PT SHORT TERM GOAL #1   Title Pt. will be independent with HEP   Status Partially Met           PT Long Term Goals - 05/23/15 0915    PT LONG TERM GOAL #1   Title Berg score will improve to 52 to decrease fall risk without an assistive device   Status On-going   PT LONG TERM GOAL #2   Title Pt.  will report able to go up/down steps at the end of day without difficulty   Status On-going               Plan - 05/23/15 0258  Clinical Impression Statement Pt 25 minutes late for PT treatment. Pt pleasant and motivated to participate in PT session. Pt able to tolerate therapeutic and balance exercises well. Recommend continued PT services to address BLE weakness.   Pt will benefit from skilled therapeutic intervention in order to improve on the following deficits Abnormal gait;Pain;Decreased strength;Difficulty walking;Decreased activity tolerance   Rehab Potential Good   PT Frequency 2x / week   PT Duration 8 weeks   PT Treatment/Interventions Therapeutic activities;Therapeutic exercise;Balance training;Neuromuscular re-education;Gait training;Patient/family education   PT Next Visit Plan instruct pt. in gym program for him to continue at [facility],  progress balance and strengthening   PT Home Exercise Plan corner balance and strengthening        Problem List Patient Active Problem List   Diagnosis Date Noted  . UTI (urinary tract infection) 09/21/2013  . VITAMIN D DEFICIENCY 01/29/2010  . Osteoarthritis 01/29/2010  . 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 LEG EDEMA 10/13/2009  .  OVERWEIGHT 11/20/2008  . GALLSTONES 11/20/2008  . Malignant neoplasm of bladder 10/10/2008  . Chronic bronchitis 10/10/2008  . Diverticulosis of large intestine 10/10/2008  . COLONIC POLYPS 12/07/2007  . HYPERCHOLESTEROLEMIA 12/07/2007  . Essential hypertension 12/07/2007  . HEMORRHOIDS 12/07/2007  . BPH (benign prostatic hypertrophy) with urinary obstruction 12/07/2007    Scot Jun, PTA 05/23/2015, 9:17 AM  [address] [place], [place], [address] Phone: [phone]   Fax:  [phone]

## 2015-05-23 NOTE — Patient Instructions (Signed)
Pt instructed to perform HEP three times a day

## 2015-05-25 ENCOUNTER — Encounter: Payer: Self-pay | Admitting: Internal Medicine

## 2015-05-30 ENCOUNTER — Encounter: Payer: Self-pay | Admitting: Physical Therapy

## 2015-05-30 ENCOUNTER — Ambulatory Visit: Payer: Medicare Other | Admitting: Physical Therapy

## 2015-05-30 DIAGNOSIS — R29898 Other symptoms and signs involving the musculoskeletal system: Secondary | ICD-10-CM | POA: Diagnosis not present

## 2015-05-30 DIAGNOSIS — R269 Unspecified abnormalities of gait and mobility: Secondary | ICD-10-CM | POA: Diagnosis not present

## 2015-05-30 DIAGNOSIS — R2681 Unsteadiness on feet: Secondary | ICD-10-CM | POA: Diagnosis not present

## 2015-05-30 DIAGNOSIS — M79605 Pain in left leg: Secondary | ICD-10-CM | POA: Diagnosis not present

## 2015-05-30 NOTE — Therapy (Signed)
[address] [place], [place], [address] Phone: [phone]   Fax:  [phone]  Physical Therapy Treatment  Patient Details  Name: Jeremy [name] MRN: [id number] Date of Birth: [birth year] Referring Provider:  Alda Berthold, DO  Encounter Date: 05/30/2015      PT End of Session - 05/30/15 0828    Visit Number 3   PT Start Time 0752   PT Stop Time 0834   PT Time Calculation (min) 42 min      Past Medical History  Diagnosis Date  . Hyperlipidemia   . Hypertension     Past Surgical History  Procedure Laterality Date  . Tonsillectomy and adenoidectomy      Surg as a child...  . Skin cancer excision      Surg x2...    There were no vitals filed for this visit.  Visit Diagnosis:  Abnormality of gait  Weakness of both legs  Unsteadiness on feet      Subjective Assessment - 05/30/15 0752    Subjective Pt reports that he feel great and the pain he usually has in his R let at the end of  the day is almost gone.   Currently in Pain? No/denies   Pain Score 0-No pain                         [facility] Adult PT Treatment/Exercise - 05/30/15 0001    High Level Balance   High Level Balance Activities Tandem walking;Backward walking   Knee/Hip Exercises: Aerobic   Nustep L 5 x8 min   Knee/Hip Exercises: Machines for Strengthening   Cybex Knee Extension #35 15 reps 3 sets    Cybex Knee Flexion #45 15 reps 3 sets   Cybex Leg Press #80 12 reps 3 set    Knee/Hip Exercises: Standing   Forward Step Up 2 sets;10 reps  On BOSU   Rebounder SLS 10 reps 2 sets each leg   Other Standing Knee Exercises Resisted gait all directions, Heel raises 2 sets 20 reps     Other Standing Knee Exercises Standing rebound ball toss on airex with  yellow weighted ball 15 reps 2 sets                  PT Short Term Goals - 05/30/15 0835    PT SHORT TERM GOAL #1   Title Pt. will be independent  with HEP   Status Achieved           PT Long Term Goals - 05/30/15 0835    PT LONG TERM GOAL #2   Title Pt.  will report able to go up/down steps at the end of day without difficulty   Status On-going               Plan - 05/30/15 [id number]    Clinical Impression Statement Pt tolerated additional balance interventions well. Pt does have difficulty with SLS activities. Pt does report an overall improvement.    Pt will benefit from skilled therapeutic intervention in order to improve on the following deficits Abnormal gait;Pain;Decreased strength;Difficulty walking;Decreased activity tolerance   PT Frequency 2x / week   PT Duration 8 weeks   PT Treatment/Interventions Therapeutic activities;Therapeutic exercise;Balance training;Neuromuscular re-education;Gait training;Patient/family education   PT Next Visit Plan stairs         Problem List Patient Active Problem List   Diagnosis Date Noted  .  UTI (urinary tract infection) 09/21/2013  . VITAMIN D DEFICIENCY 01/29/2010  . Osteoarthritis 01/29/2010  . 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 LEG EDEMA 10/13/2009  . OVERWEIGHT 11/20/2008  . GALLSTONES 11/20/2008  . Malignant neoplasm of bladder 10/10/2008  . Chronic bronchitis 10/10/2008  . Diverticulosis of large intestine 10/10/2008  . COLONIC POLYPS 12/07/2007  . HYPERCHOLESTEROLEMIA 12/07/2007  . Essential hypertension 12/07/2007  . HEMORRHOIDS 12/07/2007  . BPH (benign prostatic hypertrophy) with urinary obstruction 12/07/2007    Scot Jun, PTA 05/30/2015, 8:36 AM  [address] [place], [place], [address] Phone: [phone]   Fax:  [phone]

## 2015-06-06 ENCOUNTER — Encounter: Payer: Self-pay | Admitting: Physical Therapy

## 2015-06-06 ENCOUNTER — Ambulatory Visit: Payer: Medicare Other | Admitting: Physical Therapy

## 2015-06-06 DIAGNOSIS — R269 Unspecified abnormalities of gait and mobility: Secondary | ICD-10-CM | POA: Diagnosis not present

## 2015-06-06 DIAGNOSIS — R2681 Unsteadiness on feet: Secondary | ICD-10-CM

## 2015-06-06 DIAGNOSIS — R29898 Other symptoms and signs involving the musculoskeletal system: Secondary | ICD-10-CM

## 2015-06-06 DIAGNOSIS — M79605 Pain in left leg: Secondary | ICD-10-CM | POA: Diagnosis not present

## 2015-06-06 NOTE — Therapy (Signed)
[address] [place], [place], [address] Phone: [phone]   Fax:  [phone]  Physical Therapy Treatment  Patient Details  Name: Jeremy [name]. Paola MRN: [id number] Date of Birth: [birth year] Referring Provider:  Alda Berthold, DO  Encounter Date: 06/06/2015      PT End of Session - 06/06/15 0906    Visit Number 4   PT Start Time 0840   PT Stop Time 0910   PT Time Calculation (min) 30 min   Activity Tolerance Patient tolerated treatment well   Behavior During Therapy [facility] for tasks assessed/performed      Past Medical History  Diagnosis Date  . Hyperlipidemia   . Hypertension     Past Surgical History  Procedure Laterality Date  . Tonsillectomy and adenoidectomy      Surg as a child...  . Skin cancer excision      Surg x2...    There were no vitals filed for this visit.  Visit Diagnosis:  Weakness of both legs  Unsteadiness on feet      Subjective Assessment - 06/06/15 0841    Subjective Pt reports that he is doing well. He stated "Can we make it a short one today, I have a lot of appointments"    Currently in Pain? No/denies   Pain Score 0-No pain                         [facility] Adult PT Treatment/Exercise - 06/06/15 0001    Knee/Hip Exercises: Aerobic   Elliptical I 5 R5   NuStep L 7 x3 min   Knee/Hip Exercises: Machines for Strengthening   Cybex Knee Extension #45 15 reps 2 sets, #25 25 reps     Cybex Knee Flexion #45 15 reps 3 sets,   Cybex Leg Press #70 15 reps 3 sets    Knee/Hip Exercises: Seated   Sit to Sand 15 reps;without UE support;3 sets  Holding blue weighted ball, on airex                   PT Short Term Goals - 06/06/15 0909    PT SHORT TERM GOAL #2   Title Pt. will improve ankle plantar flexion strength to 4/5   Status Partially Met           PT Long Term Goals - 06/06/15 0909    PT LONG TERM GOAL #2   Title Pt.  will report  able to go up/down steps at the end of day without difficulty   Status Partially Met               Plan - 06/06/15 0907    Clinical Impression Statement Pt tolerated therapeutic exercise at an increased intensity with higher volume. Pt reports that all his pain is gone but he still has weakness late afternoon so endurance was focused on. Pt reports that he has a busy schedule today and requested for an shortened treatment.    Pt will benefit from skilled therapeutic intervention in order to improve on the following deficits Abnormal gait;Pain;Decreased strength;Difficulty walking;Decreased activity tolerance   Rehab Potential Good   PT Frequency 2x / week   PT Duration 8 weeks   PT Treatment/Interventions Therapeutic activities;Therapeutic exercise;Balance training;Neuromuscular re-education;Gait training;Patient/family education   PT Next Visit Plan access for d/c        Problem List Patient Active Problem List   Diagnosis Date  Noted  . UTI (urinary tract infection) 09/21/2013  . VITAMIN D DEFICIENCY 01/29/2010  . Osteoarthritis 01/29/2010  . 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 LEG EDEMA 10/13/2009  . OVERWEIGHT 11/20/2008  . GALLSTONES 11/20/2008  . Malignant neoplasm of bladder 10/10/2008  . Chronic bronchitis 10/10/2008  . Diverticulosis of large intestine 10/10/2008  . COLONIC POLYPS 12/07/2007  . HYPERCHOLESTEROLEMIA 12/07/2007  . Essential hypertension 12/07/2007  . HEMORRHOIDS 12/07/2007  . BPH (benign prostatic hypertrophy) with urinary obstruction 12/07/2007    Scot Jun, PTA 06/06/2015, 9:10 AM  [address] [place], [place], [address] Phone: [phone]   Fax:  [phone]

## 2015-06-13 ENCOUNTER — Encounter: Payer: Self-pay | Admitting: Physical Therapy

## 2015-06-13 ENCOUNTER — Ambulatory Visit: Payer: Medicare Other | Attending: Neurology | Admitting: Physical Therapy

## 2015-06-13 DIAGNOSIS — R2681 Unsteadiness on feet: Secondary | ICD-10-CM | POA: Diagnosis not present

## 2015-06-13 DIAGNOSIS — M79605 Pain in left leg: Secondary | ICD-10-CM

## 2015-06-13 DIAGNOSIS — R29898 Other symptoms and signs involving the musculoskeletal system: Secondary | ICD-10-CM | POA: Diagnosis not present

## 2015-06-13 NOTE — Therapy (Signed)
[address] [place], [place], [address] Phone: [phone]   Fax:  [phone]  Physical Therapy Treatment  Patient Details  Name: Jeremy [name] MRN: [id number] Date of Birth: 1940/12/18 Referring Provider:  Alda Berthold, DO  Encounter Date: 06/13/2015      PT End of Session - 06/13/15 0841    Visit Number 5   PT Start Time 0758   PT Stop Time 0844   PT Time Calculation (min) 46 min   Activity Tolerance Patient tolerated treatment well;Patient limited by fatigue   Behavior During Therapy [facility] for tasks assessed/performed      Past Medical History  Diagnosis Date  . Hyperlipidemia   . Hypertension     Past Surgical History  Procedure Laterality Date  . Tonsillectomy and adenoidectomy      Surg as a child...  . Skin cancer excision      Surg x2...    There were no vitals filed for this visit.  Visit Diagnosis:  Weakness of both legs  Unsteadiness on feet  Pain of left lower extremity      Subjective Assessment - 06/13/15 0758    Subjective Pt reports that everything is going great. Pt reports twisting his R [place].   Currently in Pain? Yes   Pain Score 3    Pain Location Knee   Pain Orientation Right   Pain Descriptors / Indicators Constant                         [facility] Adult PT Treatment/Exercise - 06/13/15 0001    Ambulation/Gait   Stairs Yes   Stairs Assistance 6: Modified independent (Device/Increase time)   Stair Management Technique No rails;Alternating pattern   Number of Stairs 14   Height of Stairs 6   Gait Comments x3    Knee/Hip Exercises: Aerobic   Stationary Bike x8 min  L2    Elliptical I 5 R5  x5 min   Knee/Hip Exercises: Machines for Strengthening   Cybex Knee Extension #55 10 reps 3 sets,    Cybex Knee Flexion #55 10 reps 3 sets,   Cybex Leg Press #80 15 reps 3 sets    Knee/Hip Exercises: Standing   Forward  Step Up Both;2 sets;Step Height: 8";10 reps  #5 ankle holding blue weighted ball    Other Standing Knee Exercises march on airex #5 ankle 10 reps 2 sets, single leg heel raises 10 reps 3 sets     Other Standing Knee Exercises Standing rebound ball toss on airex with  yellow weighted ball 10 reps 3 directions    Knee/Hip Exercises: Seated   Sit to Sand 15 reps;without UE support;3 sets  overhead press with blue weighted ball                   PT Short Term Goals - 06/13/15 0829    PT SHORT TERM GOAL #2   Title Pt. will improve ankle plantar flexion strength to 4/5   Status Achieved           PT Long Term Goals - 06/06/15 0909    PT LONG TERM GOAL #2   Title Pt.  will report able to go up/down steps at the end of day without difficulty   Status Partially Met               Plan -  06/13/15 0841    Clinical Impression Statement Pt tolerated exercises with increased weight. Pt continues to have some balance issues. Pt reports that he is doing great at home. Pt reports no difficulty going up and down stairs. Pt express that he fell like this should be is last day.    Pt will benefit from skilled therapeutic intervention in order to improve on the following deficits Abnormal gait;Pain;Decreased strength;Difficulty walking;Decreased activity tolerance   PT Frequency 2x / week   PT Duration 8 weeks   PT Treatment/Interventions Therapeutic activities;Therapeutic exercise;Balance training;Neuromuscular re-education;Gait training;Patient/family education   PT Next Visit Plan d/c PT         Problem List Patient Active Problem List   Diagnosis Date Noted  . UTI (urinary tract infection) 09/21/2013  . VITAMIN D DEFICIENCY 01/29/2010  . Osteoarthritis 01/29/2010  . 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 LEG EDEMA 10/13/2009  . OVERWEIGHT 11/20/2008  . GALLSTONES 11/20/2008  . Malignant neoplasm of bladder 10/10/2008  . Chronic bronchitis 10/10/2008  .  Diverticulosis of large intestine 10/10/2008  . COLONIC POLYPS 12/07/2007  . HYPERCHOLESTEROLEMIA 12/07/2007  . Essential hypertension 12/07/2007  . HEMORRHOIDS 12/07/2007  . BPH (benign prostatic hypertrophy) with urinary obstruction 12/07/2007    Scot Jun, PTA 06/13/2015, 8:47 AM  [address] [place], [place], [address] Phone: [phone]   Fax:  [phone]

## 2015-06-28 DIAGNOSIS — L570 Actinic keratosis: Secondary | ICD-10-CM | POA: Diagnosis not present

## 2015-06-28 DIAGNOSIS — L820 Inflamed seborrheic keratosis: Secondary | ICD-10-CM | POA: Diagnosis not present

## 2015-06-28 DIAGNOSIS — M6740 Ganglion, unspecified site: Secondary | ICD-10-CM | POA: Diagnosis not present

## 2015-07-26 ENCOUNTER — Other Ambulatory Visit: Payer: Self-pay | Admitting: Pulmonary Disease

## 2015-07-27 ENCOUNTER — Telehealth: Payer: Self-pay | Admitting: Pulmonary Disease

## 2015-07-27 NOTE — Telephone Encounter (Signed)
Called and spoke with pt Informed pt that we had received a refill request for Levaquin that was rx'd last year. Calling to see if pt is ill and needs abt Pt states that he requested refill by mistake and is feeling fine  Will send decline to pt's pharmacy stating it is not needed at this time  Nothing further is needed

## 2015-08-04 ENCOUNTER — Encounter: Payer: Self-pay | Admitting: Neurology

## 2015-08-04 ENCOUNTER — Ambulatory Visit (INDEPENDENT_AMBULATORY_CARE_PROVIDER_SITE_OTHER): Payer: Medicare Other | Admitting: Neurology

## 2015-08-04 VITALS — BP 128/68 | HR 78 | Ht 70.0 in | Wt 203.5 lb

## 2015-08-04 DIAGNOSIS — M48062 Spinal stenosis, lumbar region with neurogenic claudication: Secondary | ICD-10-CM

## 2015-08-04 DIAGNOSIS — M4806 Spinal stenosis, lumbar region: Secondary | ICD-10-CM

## 2015-08-04 DIAGNOSIS — R29898 Other symptoms and signs involving the musculoskeletal system: Secondary | ICD-10-CM | POA: Diagnosis not present

## 2015-08-04 DIAGNOSIS — M5410 Radiculopathy, site unspecified: Secondary | ICD-10-CM

## 2015-08-04 NOTE — Patient Instructions (Addendum)
Keep up the great work!   Continue your home exercises for balance and leg strengthening You can quit smoking - keep cutting back on it!! We will order MRI lumbar spine and call you with the results For long distance travels, you may try taking Aleve or tylenol before your trip to see if this will help your leg pain Return to clinic as needed

## 2015-08-04 NOTE — Progress Notes (Signed)
Follow-up Visit   Date: 08/04/2015    Jeremy [name]. Semmel MRN: [id number] DOB: 05/06/1941   Interim History: Jeremy [name]. Jeremy [name] is a 74 y.o. right-handed Caucasian male with hypertension, tobacco use, and hyperlipidemia returning to the clinic for follow-up of bilateral leg weakness.  The patient was accompanied to the clinic by self.  History of present illness: Starting 2014, he was found to have a baker's cyst had it drained. Six months later, he developed weakness of his legs and lower leg pain. He was reevaluated by his orthopeadic provider who noted no abnormalities of the knees. Over the past several month, he has developed greater difficulty getting out of a low chair and climbing stairs. No knee pain. In the morning, he is feeling well but after prolonged sitting (sitting in the car), legs are weak and he has achy pain involving his back and legs.   He endorses numbness/tingling of the lower legs, worse on the left. Balance is good. Shoe inserts have helped. He also reports to drinking 2-3 glasses of wine daily for the past 10-15 years and is an active smoker.   UPDATE 08/04/2015:  He has noticed significant improvement of his leg pain, especially during the day since doing physical therapy.  Now, he only has right leg pain after driving 1 hour. He travel most weekends, so having to stop when driving frequently to stretch his legs can by frustrating.   He continues to have mild problems with balance and leg strength.  He is now doing PT with his Physiological scientist.  He previously was smoking 2 ppd and has reduced it to < 1 ppd!  Medications:  Current Outpatient Prescriptions on File Prior to Visit  Medication Sig Dispense Refill  . amLODipine (NORVASC) 10 MG tablet Take 1 tablet (10 mg total) by mouth daily. 90 tablet 1  . aspirin 81 MG tablet Take 81 mg by mouth daily.     . beta carotene w/minerals (OCUVITE) tablet Take 1 tablet by mouth daily.    . finasteride (PROSCAR) 5 MG  tablet Take 5 mg by mouth daily.    Marland Kitchen lisinopril (PRINIVIL,ZESTRIL) 20 MG tablet Take 1 tablet (20 mg total) by mouth daily. 90 tablet 1  . metoprolol succinate (TOPROL-XL) 50 MG 24 hr tablet Take 1 tablet (50 mg total) by mouth daily. Take with or immediately following a meal. 90 tablet 1  . Multiple Vitamin (MULTIVITAMIN) capsule Take 1 capsule by mouth daily.    . simvastatin (ZOCOR) 40 MG tablet Take 1 tablet (40 mg total) by mouth daily. 90 tablet 1  . tamsulosin (FLOMAX) 0.4 MG CAPS Take 0.8 mg by mouth daily after supper.      No current facility-administered medications on file prior to visit.    Allergies:  Allergies  Allergen Reactions  . Sulfa Antibiotics     Rash, light headed, shaking  . Penicillins     REACTION: hives severe as a child    Review of Systems:  CONSTITUTIONAL: No fevers, chills, night sweats, or weight loss.  EYES: No visual changes or eye pain ENT: No hearing changes.  No history of nose bleeds.   RESPIRATORY: No cough, wheezing and shortness of breath.   CARDIOVASCULAR: Negative for chest pain, and palpitations.   GI: Negative for abdominal discomfort, blood in stools or black stools.  No recent change in bowel habits.   GU:  No history of incontinence.   MUSCLOSKELETAL: No history of joint pain or swelling.  No myalgias.   SKIN: Negative for lesions, rash, and itching.   ENDOCRINE: Negative for cold or heat intolerance, polydipsia or goiter.   PSYCH:  No depression or anxiety symptoms.   NEURO: As Above.   Vital Signs:   Neurological Exam: MENTAL STATUS including orientation to time, place, person, recent and remote memory, attention span and concentration, language, and fund of knowledge is normal.  Speech is not dysarthric, but his voice has a "wet" quality.  CRANIAL NERVES:  Pupils equal round and reactive to light.  Normal conjugate, extra-ocular eye movements in all directions of gaze.  No ptosis. Normal facial sensation.  Face is symmetric.  Palate elevates symmetrically.  Tongue is midline.  MOTOR:  Motor strength is 5/5 in all extremities, except 5-5 bilateral hip flexion.  No pronator drift.  Tone is normal.    MSRs:  Reflexes are 2+/4 throughout, except 0 Achilles.  SENSORY:  Intact to to vibration throughout.  COORDINATION/GAIT:  He is easily able to stand to rise from a low chair unassisted. Gait narrow based and stable. Mild unsteadiness with tandem gait.  Data: Lab Results  Component Value Date   TSH 0.68 03/08/2015   Lab Results  Component Value Date   HGBA1C 5.6 07/29/2013     IMPRESSION: 1.  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  Peripheral neuropathy, mild 3.  Tobacco use - trying to quit! 4.  Alcohol use, moderation   PLAN/RECOMMENDATIONS:  1.  MRI lumbar spine wo contrast 2.  Continue home exervises 3.  We discussed trying muscle relaxant as pain is still bothersome with long distance travel, but he would like to try Aleve or tylenol instead 4.  Smoking cessation instruction/counseling given:  counseled patient on the dangers of tobacco use, advised patient to stop smoking, and reviewed strategies to maximize success  Commended him for cutting back!  Return to clinic as needed   The duration of this appointment visit was 25 minutes of face-to-face time with the patient.  Greater than 50% of this time was spent in counseling, explanation of diagnosis, planning of further management, and coordination of care.   Thank you for allowing me to participate in patient's care.  If I can answer any additional questions, I would be pleased to do so.    Sincerely,    Alyssia Heese K. Posey Pronto, DO

## 2015-08-18 ENCOUNTER — Ambulatory Visit
Admission: RE | Admit: 2015-08-18 | Discharge: 2015-08-18 | Disposition: A | Discharge status: Home / Self Care | Payer: Medicare Other | Source: Ambulatory Visit | Attending: Neurology | Admitting: Neurology

## 2015-08-18 ENCOUNTER — Telehealth: Payer: Self-pay | Admitting: Neurology

## 2015-08-18 DIAGNOSIS — R29898 Other symptoms and signs involving the musculoskeletal system: Secondary | ICD-10-CM

## 2015-08-18 DIAGNOSIS — M4806 Spinal stenosis, lumbar region: Secondary | ICD-10-CM | POA: Diagnosis not present

## 2015-08-18 DIAGNOSIS — M5410 Radiculopathy, site unspecified: Secondary | ICD-10-CM

## 2015-08-18 DIAGNOSIS — M48062 Spinal stenosis, lumbar region with neurogenic claudication: Secondary | ICD-10-CM

## 2015-08-18 NOTE — Telephone Encounter (Signed)
I attempted to contact patient via phone today regarding the results of MRI lumbar spine, however there was no answer so a message was left for the patient to return my call.

## 2015-08-21 ENCOUNTER — Telehealth: Payer: Self-pay | Admitting: Neurology

## 2015-08-21 NOTE — Telephone Encounter (Signed)
Called and informed patient of his MRI lumbar spine results which shows nerve impingement and facet arthritis affecting the L4-5 and L5-S1 levels, as well as disc protrusion at L3-4.    Additionally, I explained that there are some abnormalities involving the vertebra that needs to be further investigated and will reach out to his PCP to see if he can look into further testing.   Regarding his back and leg pain, he is interested in epidural steroid injections, but I think it would be reasonable to hold on this until after a bone scan has been obtained.   MRI lumbar spine 08/18/2015:   1. 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 Bone scan may be useful for further evaluation. 2. Bilateral lateral recess impingement at L4-5, left worse than right due to bilateral severe facet arthritis with inflammation around the joints, more on the right than the left. 3. 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 4. Left extra foraminal and foraminal disc protrusions at L3-4 which slightly deviates the L3 nerves posteriorly lateral to the neural foramina.  Jeremy [name] K. Posey Pronto, DO

## 2015-08-22 ENCOUNTER — Other Ambulatory Visit: Payer: Self-pay | Admitting: Pulmonary Disease

## 2015-08-22 DIAGNOSIS — C7951 Secondary malignant neoplasm of bone: Secondary | ICD-10-CM

## 2015-08-23 ENCOUNTER — Other Ambulatory Visit (INDEPENDENT_AMBULATORY_CARE_PROVIDER_SITE_OTHER): Payer: Medicare Other

## 2015-08-23 DIAGNOSIS — C7951 Secondary malignant neoplasm of bone: Secondary | ICD-10-CM | POA: Diagnosis not present

## 2015-08-23 LAB — CBC WITH DIFFERENTIAL/PLATELET
BASOS PCT: 0.4 % (ref 0.0–3.0)
Basophils Absolute: 0 10*3/uL (ref 0.0–0.1)
EOS PCT: 2.2 % (ref 0.0–5.0)
Eosinophils Absolute: 0.2 10*3/uL (ref 0.0–0.7)
HCT: 45.8 % (ref 39.0–52.0)
Hemoglobin: 15.3 g/dL (ref 13.0–17.0)
LYMPHS ABS: 2.6 10*3/uL (ref 0.7–4.0)
Lymphocytes Relative: 29.8 % (ref 12.0–46.0)
MCHC: 33.5 g/dL (ref 30.0–36.0)
MCV: 93.2 fl (ref 78.0–100.0)
MONOS PCT: 8.9 % (ref 3.0–12.0)
Monocytes Absolute: 0.8 10*3/uL (ref 0.1–1.0)
NEUTROS PCT: 58.7 % (ref 43.0–77.0)
Neutro Abs: 5 10*3/uL (ref 1.4–7.7)
PLATELETS: 293 10*3/uL (ref 150.0–400.0)
RBC: 4.92 Mil/uL (ref 4.22–5.81)
RDW: 13.6 % (ref 11.5–15.5)
WBC: 8.6 10*3/uL (ref 4.0–10.5)

## 2015-08-23 LAB — BASIC METABOLIC PANEL
BUN: 19 mg/dL (ref 6–23)
CALCIUM: 9.5 mg/dL (ref 8.4–10.5)
CO2: 30 mEq/L (ref 19–32)
Chloride: 103 mEq/L (ref 96–112)
Creatinine, Ser: 1.19 mg/dL (ref 0.40–1.50)
GFR: 63.3 mL/min (ref >60.00)
GLUCOSE: 89 mg/dL (ref 70–99)
POTASSIUM: 4.2 meq/L (ref 3.5–5.1)
Sodium: 139 mEq/L (ref 135–145)

## 2015-08-23 LAB — HEPATIC FUNCTION PANEL
ALK PHOS: 56 U/L (ref 39–117)
ALT: 14 U/L (ref 0–53)
AST: 12 U/L (ref 0–37)
Albumin: 4.1 g/dL (ref 3.5–5.2)
BILIRUBIN DIRECT: 0.1 mg/dL (ref 0.0–0.3)
BILIRUBIN TOTAL: 0.7 mg/dL (ref 0.2–1.2)
Total Protein: 7.3 g/dL (ref 6.0–8.3)

## 2015-08-23 LAB — C-REACTIVE PROTEIN: CRP: 0.3 mg/dL — ABNORMAL LOW (ref 0.5–20.0)

## 2015-08-23 LAB — SEDIMENTATION RATE: Sed Rate: 22 mm/hr (ref 0–22)

## 2015-08-30 DIAGNOSIS — N281 Cyst of kidney, acquired: Secondary | ICD-10-CM | POA: Diagnosis not present

## 2015-09-01 ENCOUNTER — Encounter (HOSPITAL_COMMUNITY)
Admission: RE | Admit: 2015-09-01 | Discharge: 2015-09-01 | Disposition: A | Discharge status: Home / Self Care | Payer: Medicare Other | Source: Ambulatory Visit | Attending: Pulmonary Disease | Admitting: Pulmonary Disease

## 2015-09-01 DIAGNOSIS — R948 Abnormal results of function studies of other organs and systems: Secondary | ICD-10-CM | POA: Diagnosis not present

## 2015-09-01 DIAGNOSIS — C7951 Secondary malignant neoplasm of bone: Secondary | ICD-10-CM | POA: Diagnosis not present

## 2015-09-01 MED ORDER — TECHNETIUM TC 99M MEDRONATE IV KIT: 27.4000 | PACK | Freq: Once | INTRAVENOUS | Status: DC | PRN

## 2015-09-01 MED ORDER — TECHNETIUM TC 99M MEDRONATE IV KIT
27.4000 | PACK | Freq: Once | INTRAVENOUS | Status: AC | PRN
  Administered 2015-09-01: 27.4 via INTRAVENOUS

## 2015-09-06 DIAGNOSIS — R3914 Feeling of incomplete bladder emptying: Secondary | ICD-10-CM | POA: Diagnosis not present

## 2015-09-06 DIAGNOSIS — N401 Enlarged prostate with lower urinary tract symptoms: Secondary | ICD-10-CM | POA: Diagnosis not present

## 2015-09-06 DIAGNOSIS — R8271 Bacteriuria: Secondary | ICD-10-CM | POA: Diagnosis not present

## 2015-09-12 DIAGNOSIS — Z23 Encounter for immunization: Secondary | ICD-10-CM | POA: Diagnosis not present

## 2015-10-20 ENCOUNTER — Other Ambulatory Visit: Payer: Self-pay | Admitting: Pulmonary Disease

## 2015-11-14 ENCOUNTER — Encounter: Payer: Self-pay | Admitting: Cardiology

## 2016-01-01 ENCOUNTER — Other Ambulatory Visit: Payer: Self-pay | Admitting: Pulmonary Disease

## 2016-03-01 DIAGNOSIS — R972 Elevated prostate specific antigen [PSA]: Secondary | ICD-10-CM | POA: Diagnosis not present

## 2016-03-06 DIAGNOSIS — N401 Enlarged prostate with lower urinary tract symptoms: Secondary | ICD-10-CM | POA: Diagnosis not present

## 2016-03-06 DIAGNOSIS — R3914 Feeling of incomplete bladder emptying: Secondary | ICD-10-CM | POA: Diagnosis not present

## 2016-03-07 ENCOUNTER — Other Ambulatory Visit (INDEPENDENT_AMBULATORY_CARE_PROVIDER_SITE_OTHER): Payer: Medicare Other

## 2016-03-07 ENCOUNTER — Ambulatory Visit (INDEPENDENT_AMBULATORY_CARE_PROVIDER_SITE_OTHER)
Admission: RE | Admit: 2016-03-07 | Discharge: 2016-03-07 | Disposition: A | Discharge status: Home / Self Care | Payer: Medicare Other | Source: Ambulatory Visit | Attending: Pulmonary Disease | Admitting: Pulmonary Disease

## 2016-03-07 ENCOUNTER — Encounter: Payer: Self-pay | Admitting: Pulmonary Disease

## 2016-03-07 ENCOUNTER — Ambulatory Visit (INDEPENDENT_AMBULATORY_CARE_PROVIDER_SITE_OTHER): Payer: Medicare Other | Admitting: Pulmonary Disease

## 2016-03-07 VITALS — BP 138/80 | HR 76 | Temp 97.6°F | Ht 70.0 in | Wt 208.4 lb

## 2016-03-07 DIAGNOSIS — E7800 Pure hypercholesterolemia, unspecified: Secondary | ICD-10-CM

## 2016-03-07 DIAGNOSIS — N401 Enlarged prostate with lower urinary tract symptoms: Secondary | ICD-10-CM

## 2016-03-07 DIAGNOSIS — N138 Other obstructive and reflux uropathy: Secondary | ICD-10-CM

## 2016-03-07 DIAGNOSIS — F411 Generalized anxiety disorder: Secondary | ICD-10-CM | POA: Diagnosis not present

## 2016-03-07 DIAGNOSIS — M150 Primary generalized (osteo)arthritis: Secondary | ICD-10-CM

## 2016-03-07 DIAGNOSIS — E559 Vitamin D deficiency, unspecified: Secondary | ICD-10-CM

## 2016-03-07 DIAGNOSIS — I10 Essential (primary) hypertension: Secondary | ICD-10-CM | POA: Diagnosis not present

## 2016-03-07 DIAGNOSIS — K5730 Diverticulosis of large intestine without perforation or abscess without bleeding: Secondary | ICD-10-CM | POA: Diagnosis not present

## 2016-03-07 DIAGNOSIS — M159 Polyosteoarthritis, unspecified: Secondary | ICD-10-CM

## 2016-03-07 DIAGNOSIS — I872 Venous insufficiency (chronic) (peripheral): Secondary | ICD-10-CM | POA: Diagnosis not present

## 2016-03-07 DIAGNOSIS — G4730 Sleep apnea, unspecified: Secondary | ICD-10-CM

## 2016-03-07 DIAGNOSIS — C679 Malignant neoplasm of bladder, unspecified: Secondary | ICD-10-CM

## 2016-03-07 DIAGNOSIS — D126 Benign neoplasm of colon, unspecified: Secondary | ICD-10-CM

## 2016-03-07 LAB — LIPID PANEL
CHOLESTEROL: 136 mg/dL (ref 0–200)
HDL: 59.5 mg/dL (ref >39.00)
LDL Cholesterol: 63 mg/dL (ref 0–99)
NONHDL: 76.61
TRIGLYCERIDES: 67 mg/dL (ref 0.0–149.0)
Total CHOL/HDL Ratio: 2
VLDL: 13.4 mg/dL (ref 0.0–40.0)

## 2016-03-07 LAB — CBC WITH DIFFERENTIAL/PLATELET
BASOS ABS: 0 10*3/uL (ref 0.0–0.1)
BASOS PCT: 0.4 % (ref 0.0–3.0)
Eosinophils Absolute: 0.2 10*3/uL (ref 0.0–0.7)
Eosinophils Relative: 1.9 % (ref 0.0–5.0)
HEMATOCRIT: 46.2 % (ref 39.0–52.0)
Hemoglobin: 15.9 g/dL (ref 13.0–17.0)
LYMPHS PCT: 25.6 % (ref 12.0–46.0)
Lymphs Abs: 2.1 10*3/uL (ref 0.7–4.0)
MCHC: 34.5 g/dL (ref 30.0–36.0)
MCV: 90.7 fl (ref 78.0–100.0)
MONOS PCT: 8.7 % (ref 3.0–12.0)
Monocytes Absolute: 0.7 10*3/uL (ref 0.1–1.0)
NEUTROS ABS: 5.2 10*3/uL (ref 1.4–7.7)
Neutrophils Relative %: 63.4 % (ref 43.0–77.0)
PLATELETS: 306 10*3/uL (ref 150.0–400.0)
RBC: 5.09 Mil/uL (ref 4.22–5.81)
RDW: 14.1 % (ref 11.5–15.5)
WBC: 8.2 10*3/uL (ref 4.0–10.5)

## 2016-03-07 LAB — COMPREHENSIVE METABOLIC PANEL
ALK PHOS: 52 U/L (ref 39–117)
ALT: 20 U/L (ref 0–53)
AST: 15 U/L (ref 0–37)
Albumin: 4.5 g/dL (ref 3.5–5.2)
BILIRUBIN TOTAL: 0.9 mg/dL (ref 0.2–1.2)
BUN: 24 mg/dL — ABNORMAL HIGH (ref 6–23)
CALCIUM: 10 mg/dL (ref 8.4–10.5)
CO2: 31 mEq/L (ref 19–32)
Chloride: 100 mEq/L (ref 96–112)
Creatinine, Ser: 1.26 mg/dL (ref 0.40–1.50)
GFR: 59.18 mL/min — AB (ref >60.00)
Glucose, Bld: 110 mg/dL — ABNORMAL HIGH (ref 70–99)
Potassium: 4.5 mEq/L (ref 3.5–5.1)
Sodium: 138 mEq/L (ref 135–145)
TOTAL PROTEIN: 8 g/dL (ref 6.0–8.3)

## 2016-03-07 LAB — VITAMIN D 25 HYDROXY (VIT D DEFICIENCY, FRACTURES): VITD: 44.95 ng/mL (ref 30.00–100.00)

## 2016-03-07 LAB — TSH: TSH: 0.94 u[IU]/mL (ref 0.35–4.50)

## 2016-03-07 NOTE — Patient Instructions (Signed)
Today we updated your med list in our EPIC system...    Continue your current medications the same...    We refilled your meds per request...  For the chest congestion & cough>    Try the OTC MUCINEX 600mg  tabs- 2 tabs twice daily w/ water...    Try the OTC DELSYM cough syrup- 2 tsp twice daily as needed for cough...  Today we did your follow up CXR, EKG, & FASTING blood work... We will arrange for an outpt SLEEP STUDY...    We will contact you w/ the results when available...   Keep up the good work w/ diet/ exercise/ weight reduction...    Stay as active as possible...  Call for any questions...  Let's plan a follow up visit in 66yr, sooner if needed for problems.Marland KitchenMarland Kitchen

## 2016-03-07 NOTE — Progress Notes (Addendum)
Subjective:     Patient ID: Jeremy [name]. Mohs, male   DOB: [birth year], 75 y.o.   MRN: [id number]  HPI 75 y/o WM here for a follow up visit... he has multiple medical problems as noted below...     CXR 3/13 showed normal heart size, COPD w/ hyperinflation, clear/ NAD.Jeremy KitchenMarland [name]  EKG 3/13 showed NSR, rate66, ?LAE, otherw WNL.Jeremy KitchenMarland [name]  LABS 3/13:  FLP- at goals on Simva40;  Chems- wnl w/ BS110;  CBC- wnl;  TSH=0.88;  PSA=3.87  ~  January 25, 2013:  Yearly ROV & Jeremy [name] has had a good yr except for some recurrent UTIs which prompted a Urology f/u w/ DrOttelin & Borden> eval revealed worsening BPH/ LUTS and PVR>600cc; started on Proscar & Flomax w/ improvement (voiding better, decr nocturia, stream improved)... We reviewed the following medical problems during today's office visit >>     Ex-cig smoker> he quit regular cigs >84yr ago & uses occas E-cig/ cigar; denies cough, sputum, SOB, & he remains active w/ walking & golfs regularly...    HBP> on MetopXL50, Norvasc10, Lisinopril20; BP= 128/68 & similar at home; denies CP/angina, palpit, SOB or edema...    Cholesterol> on Simva40; FLP 3/14 shows TChol 143, TG 44, HDL 57, LDL 78; continue same...    GI (divertics, polyps,hems) stable & f/u colon done 11/13 by DrDBrodie- mod divertics, 2 sessile polyps w/ path revealing benign mucosa & f/u rec 166yr    GU- hx of Bladder Ca, BPH, UTIs, ED>  followed by DrOttelin/Borden & seen 1/14 w/ recurrent UTIs, signif PVR>600, & treated w/ Proscar5 & Flomax0.4 w/ sigif improvement in symptoms; cysto was otherw neg & PSA= 4.46 w/ 30%free...    Hx DJD & Vit D defic> followed by DrAlusio & he's taking 1000u VitD supplement... We reviewed prob list, meds, xrays and labs> see below for updates >>   CXR 3/14 showed norm heart size, clear lungs w/ min basilar scarring, NAD...  LABS 3/14:  FLP- at goals on Simva40 (contin same);  Chems- ok x BS=128 (needs better diet to avoid DM meds);  CBC- wnl;  TSH=0.75...   ~  September 21, 2013:  67m89moOV & post hosp check> JohTayons Adm 9/18-19 w/ "indigestion" pain is epig/ lower chest wall w/ radiation to back; no assoc n/v, no SOB or palpit etc; this was followed by chills & sweat prompting call to EMS & taken to ER; the discomfort resolved w/ time & PPI rx;  Eval revealed a neg exam, clear CXR x bibasilar atx, norm EKG, neg cardiac enz, 2DEcho wnl x gr1DD;  He took the PPI for awhile then stopped on his own w/o recurrent symptoms- denies abd pain, dysphagia, reflux, n/v, c/d, etc;  After disch a urine cult ret pos for >100K Enterococcus Faecalis, sens Levaquin & we treated him x7d for this prob;  He has had f/u DrBorden, Urology & he reports difficulty emptying his bladder & meds adjusted- Finasteride5 + Tamsulosin0.4 and improving...   He is sched [facility]...     We reviewed the following medical problems during today's office visit >> he had the 2014 Flu vaccine 9/14  EKG 9/14 showed NSR, rate60, WNL, NAD...  2DEcho 9/14 showed norm LV size & function w/ EF=55-60%, norm wall motion, Gr1DD, mild LA&RA dil, valves ok, RV ok...  ~  January 26, 2014:  58mo867mo & here for his annnual check-up> Jeremy [name] doing satis, [place]  no issues medically... He had Ectropion surg at [facility] 11/14... [facility]... We reviewed the following medical problems during today's office visit >>     Ex-cig smoker> he quit regular cigs >35yr ago & uses occas E-cig/ cigar; denies cough, sputum, SOB, & he remains active w/ walking & golfs regularly...    HBP> on MetopXL50, Norvasc10, Lisinopril20; BP= 110/68 & similar at home; denies CP/angina, palpit, SOB or edema...    Cholesterol> on Simva40; FLP 3/15 shows TChol 173, TG 103, HDL 56, LDL 97; continue same...    GI (divertics, polyps,hems) stable & f/u colon done 11/13 by DrDBrodie- mod divertics, 2 sessile polyps w/ path revealing benign mucosa & f/u rec 129yr    GU- hx of Bladder Ca, BPH, UTIs, ED>   followed by DrOttelin/Borden & seen 1/14 w/ recurrent UTIs, signif PVR>600, & treated w/ Proscar5 & Flomax0.4 w/ sigif improvement in symptoms; cysto was otherw neg & PSA= 4.46 w/ 30%free...    Hx DJD & Vit D defic> followed by DrAlusio & he's taking 1000u VitD supplement... We reviewed prob list, meds, xrays and labs> see below for updates >> Given Prevnar-13 today & Rx for Shingles shot...  LABS 3/15:  FLP- at goals on Simva40;  Chems- wnl;  CBC- wnl;  TSH=0.86...  ~  March 08, 2015:  138moV & JohAbhirajports doing satis & no new complaints or concerns; [place]- he knows to avoid the sun, use sunscreen & he gets checke regularly by Derm-DrGruber;  He also gets checked Q6mo6moUrology-DrBorden & was seen 9/15 (note reviewed) and recently 4/16 (note pending)- he tells me that PSA was good & cysto was neg, they were concerned that he wasn't emptying his bladder well 7 increased his Flomax to 0.8;  Unfortunately [name] smoking some- he's decr to 1/4ppd + Ecig & I implored him to quit completely... We reviewed the following medical problems during today's office visit >>     cig smoker> he has returned to smoking- now 1/4ppd + Ecig; he knows the dangers and risks!  He notes min cough & clear sputum, no hemoptysis, & denies SOB (remains active w/ walking & golfs regularly)...    HBP> on ASA81, MetopXL50, Norvasc10, Lisinopril20; BP= 140/80 & better at home; denies CP/angina, palpit, SOB or edema...    Cholesterol> on Simva40; FLP 4/16 shows TChol 165, TG 80, HDL 60, LDL 89; continue same...    GI- divertics, polyps,hems> stable & f/u colon done 11/13 by DrDBrodie- mod divertics, 2 sessile polyps w/ path revealing benign mucosa & f/u rec 70yr99yr GU- hx of Bladder Ca, BPH, UTIs, ED>  followed by DrBorden & seen Q6mo 966mo& 4/16- note pending, pt reports PSA was good & Cysto- NEG; on Proscar5 & Flomax incr to 2 tabs daily for better emptying... Note- Cr=1.56,  copy to DrBorden, avoid NSAIDs, incr water intake...    Hx DJD & Vit D defic> followed by DrAlusio & he's taking 1000u VitD supplement... We reviewed prob list, meds, xrays and labs> see below for updates >>   CXR 4/16 showed norm heart size, clear lungs w/ mild bibasilar atx/ scarring, DJD Tspine, NAD...  LABS 4/16:  FLP- all at goals on Simva40;  Chems- ok x Cr=1.56;  CBC- wnl;  TSH=0.68...  ~  March 07, 2016:  7yr [id number] a good yr, doing well w/o new complaints or  concerns; however his wife is c/osleep apnea that she has witnessed & we will arrange for a home sleep test; he denies daytime sleepiness issues and wakes refreshed, denies sleep pressure during the day, need to nap, any prob w/ driving etc- PHK=3/27... We reviewed the following medical problems during today's office visit >>     R/O OSA>  We will sched a home sleep study for screening purposes...    Cig smoker> he continues to smoke cigs; he knows the dangers and risks!  He notes min cough & clear sputum, no hemoptysis, & denies SOB (remains active w/ walking & golfs regularly)...    HBP> on ASA81, MetopXL50, Norvasc10, Lisinopril20; BP= 138/80 & similar at home; denies CP/angina, palpit, SOB or edema...    Cholesterol> on Simva40; [place] 4/17 shows TChol 136, TG 67, HDL 60, LDL 63; continue same...    GI- divertics, polyps,hems> stable & f/u colon done 11/13 by DrDBrodie- mod divertics, 2 sessile polyps w/ path revealing benign mucosa & f/u rec 70yr.    GU- hx of Bladder Ca, BPH, UTIs, ED>  followed by DrBorden & seen Q6228mo0/16 note reviewed & he has f/u tomorrow, pt reports PSA was good & Cysto- NEG; on Proscar5 & Flomax incr to 2 tabs daily for better emptying (PSA & cysto due now)...     Hx DJD, sp stenosis w/ bilat leg weakness, & Vit D defic> followed by DrAlusio & Neuro- DrPatel rec phys therapy/ rehab- pt notes that 2 Super-B vits and 2 VitD pills are really helping his legs! Pain resolved and aching returns off these  supplements. EXAM shows Afeb, VSS, O2sat=94% on RA;  HEENT- neg, mallamapti1, sl raspy voice;  Chest- clear w/o w/r/r;  Heart- RR w/o m/r/g;  Abd- soft, nontender, neg;  Ext- neg w/o c/c/e;  Neuro- intact...   CXR 03/07/16>  Norm heart size, Ao atherosclerosis, sl overinflation & flat diaphragms, clear lungs, degen changes in Tspine...  EKG 03/07/16>  NSR, rate66, wnl/ NAD...  LABS 03/07/16>  FLP- at goals on Simva40;  Chems- ok x FBS=110, BUN=24, Cr=1.26 (GFR60);  CBC- wnl;  TSH=0.94;  VitD=45 IMP/PLAN>>  We will arrange for a home sleep study;  Must quit all smoking;  Continue current meds...  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 ADDENDUM>>  CPAP titration study done 04/30/16 & read by DrSood 05/23/16>  Optimal CPAP = 11cmH2O w/ AHI=3.3 on CPAP, min O2sat on CPAP= 86%, EKG showed NSR w/ PVCs, PLMS=125 w/ Index=25.6/Hr & we will assess pt for RLS...           PROBLEM LIST:    OSA >> Wife noted nocturnal apneas and pt mentioned this to me 02/2016 physical;  Home sleep test w/ severe OSA AHI=43.1/hr & lowest O2sat=78%;  We will order ASAP CPAP titration in the sleep lab for this & to check to see if he need nocturnal O2...   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 ~  3/13: he tells me he quit regular cigs >43yr59yr & finally quit the E-cig 28mo 81mo admits to an occas cigar; denies cough, sputum, SOB, & he remains active w/ walking & golf... ~  3/14: he quit regular cigs >96yrs 30yr&  uses occas E-cig/ cigar; denies cough, sputum, SOB, & he remains active w/ walking & golfs regularly... ~  11/14: he has fallen off the wagon & was smoking again, quit again, & now back to Ecig; advised to quit completely, reminded about his bladder cancer & I  told him lung ca is a lot worse! He declines smoking cessation help... ~  3/15: he admits to "vaping" [place] the golf [place]; again he is asked to quit completely... ~  4/16: he is back to 1/4ppd + Ecig & asked to stop completely- he knows the risks etc... ~  4/17: he is still smoking despite all the risks...  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 uses Mucinex as needed. ~  CXR 11/09 showed mild COPD changes, NAD.Jeremy [name]. ~  CXR 3/11 showed some scarring at the bases, no change... ~  CXR 3/12 showed chr changes, incr markings, basilar scarring, NAD. ~  CXR 3/13 showed normal heart size, COPD w/ hyperinflation, clear/ NAD.Jeremy [name]. ~  CXR 3/14 showed norm heart size, clear lungs w/ min basilar scarring, NAD. ~  CXR 9/14 (hosp for atypCP, reflux & UTI) showed norm heart size, bibasilar atx, otherw clear, NAD.Jeremy [name]. ~  CXR 4/16 showed norm heart size, clear lungs w/ mild bibasilar atx/ scarring, DJD Tspine, NAD ~  CXR 4/17 showed norm heart size, Ao atherosclerosis, sl overinflation & flat diaphragms, clear lungs, degen changes in Tspine.  HYPERTENSION (ICD-401.9) - on METOPROLOL XR 2m/d,  AMLODIPINE 128md,  LISINOPRIL 2061m...  he also takes ASA 67m45m.. he has seen DrWall ([place]) in the past... BP= 134/60 & similar at home, takes meds regularly & tolerates well... denies HA, fatigue, visual changes, CP, palipit, dizziness, syncope, dyspnea, edema, etc...  ~  NuclearStressTest 8/07 showed a hypertensive response but no scar or ischemia, EF= 63%... no change from 2002 study... Toprol added... ~  EKG 9/14 showed NSR, rate60, WNL, NAD... ~  2DEcho 9/14 showed norm LV cavity size & wall thickness, norm wall motion & sys function w/ EF=55-60%, Gr1DD, mild RA&LA dil, valves looked ok... ~  3/15: on MetopXL50, Norvasc10, Lisinopril20; BP= 110/68 & similar at home; denies CP/angina, palpit, SOB or edema  ~  4/16: on ASA81, MetopXL50, Norvasc10, Lisinopril20; BP= 140/80 & better at home;  denies CP/angina, palpit, SOB or edema. ~  4/17: on ASA81, MetopXL50, Norvasc10, Lisinopril20; BP= 138/80 & similar at home; he remains asymptomatic...  VENOUS INSUFFICIENCY (ICD-459.81) - he has mod VI & intermittent min edema... he knows to elim salt, elevate, wear support hose, etc...  HYPERCHOLESTEROLEMIA (ICD-272.0) - on SIMVASTATIN 40mg46m.  ~  FLP 7[place] showed TChol 144, TG 64, HDL 41, LDL 90...  ~  FLP 1[place]9 showed TChol 173, TG 82, HDL 51, LDL 106... rec- same med + better diet. ~  FLP 3/11 showed TChol 158, TG 116, HDL 54, LDL 81 ~  FLP 3/12 on Simva40 showed TChol 155, TG 55, HDL 54, LDL 90 ~  FLP 3/13 on Simva40 showed TChol 162, TG 79, HDL 56, LDL 90 ~  FLP 3/14 on Simva40 showed TChol 143, TG 44, HDL 57, LDL 78 ~  FLP 9/14 in hosp showed TChol 154, TG 51, HDL 63, LDL 81... rec to continue Simva40. ~  FLP 3/15 on simva40 showed TChol 173, TG 103, HDL 56, LDL 97 ~  FLP 4/16 on Simva40 showed TChol 165, TG 80, HDL 60, LDL 89; continue same ~  FLP 4/17 on Simva40 showed TChol 136, TG 67, HDL 60, LDL  63; continue same...  OVERWEIGHT (ICD-278.02) -  ~  weight 11/09 = 212# since he cut way back on smoking... we discussed diet + exercise etc... ~  weight 3/11 = 207# ~  weight 3/12 = 211# ~  Weight 3/13 = 216# ~  Weight 3/14 = 211# ~  Weight 11/14 = 208# ~  Weight 3/15 = 214# ~  Weight 4/16 = 216# ~  Weight 4/17 = 208#  DIVERTICULOSIS OF COLON (ICD-562.10),  COLONIC POLYPS (ICD-211.3),  & HEMORRHOIDS (ICD-455.6) -  - last colonoscopy 9/08 by DrDBrodie showed several 3-51m polyps & divertics... path= adenomatous and f/u planned in 5 yrs... ~  F/u colonoscopy 11/13 by DrDBrodie showed 2 sessile polyps in desc colon, mod divertics, path= mult fragments of benign polypoid colorectal mucosa  GALLSTONES (ICD-574.20) - seen on prev CT Abd done by Urology...  Repeated URINARY TRACT INFECTIONS >> had Enterococcus UTI 1/14 treated by Urology w/ Levaquin... 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 he gets cystoscopies less often now...BPH w/ mild obstructive symptoms... intermittently elevated PSA on testing in the past... ~  labs 3/11 showed PSA = 3.64 ~  labs 3/12 showed PSA= 3.98 ~  Labs 3/13 showed PSA= 3.87 ~  Urology f/u DrBorden 1/14> Hx UTIs, Bladder Cancer, BPH/LTOS, & ED- on Proscar5 & Flomax0.4; they did cysto (neg) & PVR was 600cc, symptoms better on Rx now; they did PSA 1/14= 4.46 (30%free)...  ~  9/14> hosp w/ epig & lower CP w/ rad to back; urine grew Enterococcus & treated again w/ Levaquin; Urology f/u DrBorden- hx bladder ca, neg cysto 1/14, BPH/LUTS w/ PVR>600 in past now improved on Finasteride & Flomax... ~  9/15: he had f/u DrBorden- urothelial ca of bladder, BPH/LUTS, ED, f/u PSA; felt to be stable... ~  4/16: he had 648moOV DrBorden & pt reports that PSA was good, & cysto was neg; they incr his Tamsulosin to 0.8 to help his bladder emptying... ~  He continues to follow w/ DrBorden every 58m80mothey do PSA and surveillance cystos...  DEGENERATIVE JOINT DISEASE (ICD-715.90) - eval for left knee pain by DrAlusio w/ torn cartilage & baker's cyst... knee tapped, given shot w/ relief... 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 ~  labs 11/09 showed Vit D level = 22... rec to start 2000 u daily. ~  labs 3/11 showed Vit D level = 50... continue supplemental Vit D at 1000 u daily.  Health Maintenance: ~  Immunizations:  he gets the seasonal flu vaccine yearly;  had the PNEUMOVAX 2009 (age66);   TDAP 3/11...   Past Surgical History   Procedure Laterality Date  . Tonsillectomy and adenoidectomy      Surg as a child...  . Skin cancer excision      Surg x2...  [facility] DrYeatts...   Outpatient Encounter Prescriptions as of 03/07/2016  Medication Sig  . aspirin 81 MG tablet Take 81 mg by mouth daily.   . bMarland Kitchencomplex vitamins capsule Take 2 capsules  by mouth daily.  . beta carotene w/minerals (OCUVITE) tablet Take 1 tablet by mouth daily.  . cholecalciferol (VITAMIN D) 1000 units tablet Take 2,000 Units by mouth daily.  . finasteride (PROSCAR) 5 MG tablet Take 5 mg by mouth daily.  Jeremy [name] lisinopril (PRINIVIL,ZESTRIL) 20 MG tablet TAKE 1 TABLET EVERY DAY  . metoprolol succinate (TOPROL-XL) 50 MG 24 hr tablet TAKE 1 TABLET DAILY WITH OR IMMEDIATELY FOLLOWING A MEAL  . Multiple Vitamin (MULTIVITAMIN) capsule Take 1 capsule by mouth daily.  . simvastatin (ZOCOR) 40 MG tablet TAKE 1 TABLET EVERY DAY  . tamsulosin (FLOMAX) 0.4 MG CAPS Take 0.8 mg by mouth daily after supper.   Jeremy [name] amLODipine (NORVASC) 10 MG tablet Take 1 tablet (10 mg total) by mouth daily.   No facility-administered encounter medications on file as of 03/07/2016.    Allergies  Allergen Reactions  . Sulfa Antibiotics     Rash, light headed, shaking  . Penicillins     REACTION: hives severe as a child    Current Medications, Allergies, Past Medical History, Past Surgical History, Family History, and Social History were reviewed in [employer] record.    Review of Systems    Constitutional:  Denies F/C/S, anorexia, unexpected weight change. HEENT:  No HA, visual changes, earache, nasal symptoms, sore throat, hoarseness. Resp:  No cough, sputum, hemoptysis; no SOB, tightness, wheezing. Cardio:  No CP, palpit, DOE, orthopnea, edema. GI:  Denies N/V/D/C or blood in stool; no reflux, abd pain, distention, or gas. GU:  No dysuria, freq, urgency, hematuria, or flank pain. MS:  Denies joint pain, swelling,  tenderness, or decr ROM; no neck pain, back pain, etc. Neuro:  No tremors, seizures, dizziness, syncope, weakness, numbness, gait abn. Skin:  No suspicious lesions or skin rash. Heme:  No adenopathy, bruising, bleeding. Psyche: Denies confusion, sleep disturbance, hallucinations, anxiety, depression.   Objective:   Physical Exam    WD, Overweight, 75 y/o WM in NAD... GENERAL:  Alert & oriented; pleasant & cooperative... HEENT:  [place]/AT, EOM-wnl, PERRLA, EACs-clear, TMs-wnl, NOSE-clear, THROAT-clear & wnl. NECK:  Supple w/ fairROM; no JVD; normal carotid impulses w/o bruits; no thyromegaly or nodules palpated; no lymphadenopathy. CHEST:  Clear to P & A; without wheezes/ rales/ or rhonchi heard... HEART:  Regular Rhythm; without murmurs/ rubs/ or gallops detected... ABDOMEN:  Soft & nontender; normal bowel sounds; no organomegaly or masses palpated... EXT: without deformities, mild arthritic changes; no varicose veins, +ven insuffic, tr edema... NEURO:  CN's intact; motor testing normal; sensory testing normal; gait normal & balance OK. DERM:  No lesions noted; no rash etc...  RADIOLOGY DATA:  Reviewed in the EPIC EMR & discussed w/ the patient...  LABORATORY DATA:  Reviewed in the EPIC EMR & discussed w/ the patient...     Assessment:      COPD/ smoker>  CXR w/ signs of underlying COPD, but he is asymptomatic, denies cough/ SOB/ etc; needs to incr activity/ exercise & must quit all smoking... 3/15>  Given Prevnar-13  HBP>  Controlled on his 3 meds, continue same...  CHOL>  FLP is at the goals on Simva40 + diet...  OVERWEIGHT>  We reviewed diet & exercise program...  GI> Divertics, Polyps>  He is up to date w/ screening colonoscopies; he uses the Protonix 48m prn....  Gallstones>  Asymptomatic stones seen on prev scan...  BPH, Bladder cancer, Cr=1.3>  Prev Enterococcus UTI treated w/ Levaquin; followed by DrBorden & improved on Proscar/ Flomax0.8 now; they do PSAs  and  surveillance cystos...  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  Other medical issues as noted...      Plan:     Patient's Medications  New Prescriptions   No medications on file  Previous Medications   AMLODIPINE (NORVASC) 10 MG TABLET    Take 1 tablet (10 mg total) by mouth daily.   ASPIRIN 81 MG TABLET    Take 81 mg by mouth daily.    B COMPLEX VITAMINS CAPSULE    Take 2 capsules by mouth daily.   BETA CAROTENE W/MINERALS (OCUVITE) TABLET    Take 1 tablet by mouth daily.   CHOLECALCIFEROL (VITAMIN D) 1000 UNITS TABLET    Take 2,000 Units by mouth daily.   FINASTERIDE (PROSCAR) 5 MG TABLET    Take 5 mg by mouth daily.   LISINOPRIL (PRINIVIL,ZESTRIL) 20 MG TABLET    TAKE 1 TABLET EVERY DAY   METOPROLOL SUCCINATE (TOPROL-XL) 50 MG 24 HR TABLET    TAKE 1 TABLET DAILY WITH OR IMMEDIATELY FOLLOWING A MEAL   MULTIPLE VITAMIN (MULTIVITAMIN) CAPSULE    Take 1 capsule by mouth daily.   SIMVASTATIN (ZOCOR) 40 MG TABLET    TAKE 1 TABLET EVERY DAY   TAMSULOSIN (FLOMAX) 0.4 MG CAPS    Take 0.8 mg by mouth daily after supper.   Modified Medications   No medications on file  Discontinued Medications   No medications on file

## 2016-03-08 DIAGNOSIS — R972 Elevated prostate specific antigen [PSA]: Secondary | ICD-10-CM | POA: Diagnosis not present

## 2016-03-08 DIAGNOSIS — R8271 Bacteriuria: Secondary | ICD-10-CM | POA: Diagnosis not present

## 2016-03-08 DIAGNOSIS — Z0000 Encounter for general adult medical examination without abnormal findings: Secondary | ICD-10-CM | POA: Diagnosis not present

## 2016-03-08 DIAGNOSIS — C678 Malignant neoplasm of overlapping sites of bladder: Secondary | ICD-10-CM | POA: Diagnosis not present

## 2016-03-08 DIAGNOSIS — R3914 Feeling of incomplete bladder emptying: Secondary | ICD-10-CM | POA: Diagnosis not present

## 2016-03-08 DIAGNOSIS — N401 Enlarged prostate with lower urinary tract symptoms: Secondary | ICD-10-CM | POA: Diagnosis not present

## 2016-03-08 DIAGNOSIS — R3916 Straining to void: Secondary | ICD-10-CM | POA: Diagnosis not present

## 2016-03-12 DIAGNOSIS — H6123 Impacted cerumen, bilateral: Secondary | ICD-10-CM | POA: Diagnosis not present

## 2016-03-21 DIAGNOSIS — G4733 Obstructive sleep apnea (adult) (pediatric): Secondary | ICD-10-CM | POA: Diagnosis not present

## 2016-04-01 ENCOUNTER — Telehealth: Payer: Self-pay | Admitting: *Deleted

## 2016-04-01 ENCOUNTER — Encounter: Payer: Self-pay | Admitting: Pulmonary Disease

## 2016-04-01 DIAGNOSIS — G4733 Obstructive sleep apnea (adult) (pediatric): Secondary | ICD-10-CM

## 2016-04-01 NOTE — Telephone Encounter (Signed)
Error

## 2016-04-01 NOTE — Telephone Encounter (Signed)
Spoke with pt and explained results/recommendations. Pt is agreeable to CPAP titration study. Advised him that someone will contact him to schedule. Order placed. Nothing further needed.

## 2016-04-01 NOTE — Telephone Encounter (Addendum)
Per SN: please call Mr Gassner.  He had Home Sleep Test - read by Dr Halford Chessman with significant OSA and O2 desaturation.  Needs (1) schedule CPAP titration with [facility] ASAP [he needs this to determine CPAP pressure setting he'll need and to assess for need of nocturnal oxygen].  (2) Must quit all smoking "before it's too late."  Called spoke with patient's spouse who reports that pt is not home and will not return until later this afternoon (he is playing golf so will likely NOT be able t oget in touch with him on his cell phone).  Spouse will have pt return my call when he comes home.  She did also mention that pt's sister passed and they will be going out of town on Thursday, to return to [place] on Monday 5/29 which is [place].  Advised Mrs. Dermody that we can discuss the testing needed once we can discuss this with the patient.  She voiced her understanding.  Will route back to my inbox to follow up.

## 2016-04-01 NOTE — Addendum Note (Signed)
Addended by: Beckie Busing on: 04/01/2016 02:27 PM   Modules accepted: Orders

## 2016-04-01 NOTE — Telephone Encounter (Signed)
-----   Message from Chesley Mires, MD sent at 03/28/2016  4:47 PM EDT ----- Jeremy [name],  Home sleep study from 03/21/16 showed severe obstructive sleep apnea with low oxygen saturation.  AHI 43.1 and SpO2 low 78%.  Spent 378 min with SpO2 < 90%.  He needs in lab titration study.  Thanks.  Vineet

## 2016-04-02 DIAGNOSIS — G4733 Obstructive sleep apnea (adult) (pediatric): Secondary | ICD-10-CM | POA: Diagnosis not present

## 2016-04-03 ENCOUNTER — Other Ambulatory Visit: Payer: Self-pay | Admitting: *Deleted

## 2016-04-03 DIAGNOSIS — G4730 Sleep apnea, unspecified: Secondary | ICD-10-CM

## 2016-04-04 DIAGNOSIS — L570 Actinic keratosis: Secondary | ICD-10-CM | POA: Diagnosis not present

## 2016-04-04 DIAGNOSIS — L219 Seborrheic dermatitis, unspecified: Secondary | ICD-10-CM | POA: Diagnosis not present

## 2016-04-04 DIAGNOSIS — Z86018 Personal history of other benign neoplasm: Secondary | ICD-10-CM | POA: Diagnosis not present

## 2016-04-04 DIAGNOSIS — D485 Neoplasm of uncertain behavior of skin: Secondary | ICD-10-CM | POA: Diagnosis not present

## 2016-04-04 DIAGNOSIS — L821 Other seborrheic keratosis: Secondary | ICD-10-CM | POA: Diagnosis not present

## 2016-04-04 DIAGNOSIS — D225 Melanocytic nevi of trunk: Secondary | ICD-10-CM | POA: Diagnosis not present

## 2016-04-04 DIAGNOSIS — Z85828 Personal history of other malignant neoplasm of skin: Secondary | ICD-10-CM | POA: Diagnosis not present

## 2016-04-11 ENCOUNTER — Other Ambulatory Visit: Payer: Self-pay | Admitting: Pulmonary Disease

## 2016-04-25 ENCOUNTER — Encounter (HOSPITAL_BASED_OUTPATIENT_CLINIC_OR_DEPARTMENT_OTHER): Payer: Medicare Other

## 2016-04-29 DIAGNOSIS — R8271 Bacteriuria: Secondary | ICD-10-CM | POA: Diagnosis not present

## 2016-04-29 DIAGNOSIS — R972 Elevated prostate specific antigen [PSA]: Secondary | ICD-10-CM | POA: Diagnosis not present

## 2016-04-29 DIAGNOSIS — N401 Enlarged prostate with lower urinary tract symptoms: Secondary | ICD-10-CM | POA: Diagnosis not present

## 2016-04-29 DIAGNOSIS — Z8551 Personal history of malignant neoplasm of bladder: Secondary | ICD-10-CM | POA: Diagnosis not present

## 2016-04-29 DIAGNOSIS — R3914 Feeling of incomplete bladder emptying: Secondary | ICD-10-CM | POA: Diagnosis not present

## 2016-04-30 ENCOUNTER — Ambulatory Visit (HOSPITAL_BASED_OUTPATIENT_CLINIC_OR_DEPARTMENT_OTHER): Payer: Medicare Other | Attending: Pulmonary Disease | Admitting: Pulmonary Disease

## 2016-04-30 VITALS — Ht 70.5 in | Wt 206.0 lb

## 2016-04-30 DIAGNOSIS — G4761 Periodic limb movement disorder: Secondary | ICD-10-CM | POA: Diagnosis not present

## 2016-04-30 DIAGNOSIS — G4730 Sleep apnea, unspecified: Secondary | ICD-10-CM | POA: Diagnosis present

## 2016-04-30 DIAGNOSIS — G4739 Other sleep apnea: Secondary | ICD-10-CM | POA: Insufficient documentation

## 2016-04-30 DIAGNOSIS — G4733 Obstructive sleep apnea (adult) (pediatric): Secondary | ICD-10-CM | POA: Insufficient documentation

## 2016-04-30 DIAGNOSIS — G4731 Primary central sleep apnea: Secondary | ICD-10-CM

## 2016-04-30 DIAGNOSIS — I493 Ventricular premature depolarization: Secondary | ICD-10-CM | POA: Diagnosis not present

## 2016-05-23 DIAGNOSIS — G4733 Obstructive sleep apnea (adult) (pediatric): Secondary | ICD-10-CM | POA: Diagnosis not present

## 2016-05-23 DIAGNOSIS — G4761 Periodic limb movement disorder: Secondary | ICD-10-CM | POA: Diagnosis not present

## 2016-05-23 DIAGNOSIS — G4739 Other sleep apnea: Secondary | ICD-10-CM | POA: Diagnosis not present

## 2016-05-23 NOTE — Procedures (Signed)
Patient Name: Jeremy [name], Jeremy [name] Date: 04/30/2016 Gender: Male D.O.B: 19-Dec-1940 Age (years): 57 Referring Provider: Teressa Lower Height (inches): 70 Interpreting Physician: Chesley Mires MD, ABSM Weight (lbs): 207 RPSGT: Carolin Coy BMI: 30 MRN: [id number] Neck Size: 16.50  CLINICAL INFORMATION The patient is referred for a CPAP titration to treat sleep apnea.   Date of NPSG, Split Night or HST: 03/21/16 - AHI 43.1, SaO2 low 78%.  SLEEP STUDY TECHNIQUE As per the AASM Manual for the Scoring of Sleep and Associated Events v2.3 (April 2016) with a hypopnea requiring 4% desaturations. The channels recorded and monitored were frontal, central and occipital EEG, electrooculogram (EOG), submentalis EMG (chin), nasal and oral airflow, thoracic and abdominal wall motion, anterior tibialis EMG, snore microphone, electrocardiogram, and pulse oximetry. Bilevel positive airway pressure (BPAP) was initiated at the beginning of the study and titrated to treat sleep-disordered breathing.  MEDICATIONS Medications administered by patient during sleep study : No sleep medicine administered.  RESPIRATORY PARAMETERS Optimal IPAP Pressure (cm): 11 AHI at Optimal Pressure (/hr) 3.3 Optimal EPAP Pressure (cm): 11   Overall Minimal O2 (%): 85.00 Minimal O2 at Optimal Pressure (%): 86.0  SLEEP ARCHITECTURE Start Time: 10:22:53 PM Stop Time: 5:10:32 AM Total Time (min): 407.6 Total Sleep Time (min): 292.5 Sleep Latency (min): 2.9 Sleep Efficiency (%): 71.8 REM Latency (min): 74.5 WASO (min): 112.2 Stage N1 (%): 27.35 Stage N2 (%): 60.34 Stage N3 (%): 0.00 Stage R (%): 12.31 Supine (%): 49.23 Arousal Index (/hr): 58.3      CARDIAC DATA The 2 lead EKG demonstrated sinus rhythm. The mean heart rate was 62.15 beats per minute. Other EKG findings include: PVCs.  LEG MOVEMENT DATA The total Periodic Limb Movements of Sleep (PLMS) were 125. The PLMS index was 25.64. A PLMS index of <15 is considered normal in  adults.  IMPRESSIONS He did well with CPAP 11 cm H2O.  He developed treatment emergent central apneas with higher pressure settings w/o improvement when transitioned to BiPAP.  He did not require supplemental oxygen during this study.  He had an increase in his periodic limb movement index.  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  He should be assessed for presence of restless leg syndrome.   [Electronically signed] 05/23/2016 04:50 PM  Chesley Mires MD, [place], American Board of Sleep Medicine   NPI: [id number]

## 2016-05-28 DIAGNOSIS — R3914 Feeling of incomplete bladder emptying: Secondary | ICD-10-CM | POA: Diagnosis not present

## 2016-05-28 DIAGNOSIS — N401 Enlarged prostate with lower urinary tract symptoms: Secondary | ICD-10-CM | POA: Diagnosis not present

## 2016-06-06 ENCOUNTER — Telehealth: Payer: Self-pay | Admitting: Pulmonary Disease

## 2016-06-06 DIAGNOSIS — G4733 Obstructive sleep apnea (adult) (pediatric): Secondary | ICD-10-CM

## 2016-06-06 NOTE — Telephone Encounter (Signed)
Sleep Study read by VS but no notes by SN  Leigh - do you know if SN has resulted this? Thanks!

## 2016-06-06 NOTE — Telephone Encounter (Signed)
SN please advise of the pts sleep study.  thanks

## 2016-06-11 NOTE — Telephone Encounter (Signed)
Called and spoke with pt and he is aware of SN recs.  Order has been placed for pt to be set up with cpap with [facility].  Pt is aware that they will contact him to set up time to be shown these machines and masks.    Pt is aware that he will need appt with SN in 6 wks after starting on cpap.  Will need to do a download at his 6 wk appt

## 2016-07-22 ENCOUNTER — Encounter: Payer: Self-pay | Admitting: Adult Health

## 2016-07-23 ENCOUNTER — Ambulatory Visit (INDEPENDENT_AMBULATORY_CARE_PROVIDER_SITE_OTHER): Payer: Medicare Other | Admitting: Adult Health

## 2016-07-23 ENCOUNTER — Encounter: Payer: Self-pay | Admitting: Adult Health

## 2016-07-23 VITALS — BP 124/76 | HR 66 | Temp 98.2°F | Ht 70.0 in | Wt 200.0 lb

## 2016-07-23 DIAGNOSIS — G4733 Obstructive sleep apnea (adult) (pediatric): Secondary | ICD-10-CM | POA: Diagnosis not present

## 2016-07-23 NOTE — Assessment & Plan Note (Signed)
Severe sleep apnea with improved control on C Pap. Try different masks.  Plan  Patient Instructions  Use Saline nasal spray and gel As needed   Change to small nasal pillows with chin strap .  CPAP Download in 6 weeks .  Follow up Dr. Halford Chessman  In 3 months for sleep apnea and As needed

## 2016-07-23 NOTE — Progress Notes (Signed)
Subjective:    Patient ID: Jeremy [name], male    DOB: Dec 06, 1940, 75 y.o.   MRN: [id number]  HPI 75 year old male smoker followed for hypertension, hyperlipidemia, degenerative joint disease, and previous bladder cancer followed by Dr. Lenna Gilford   TEST  home sleep study revealed severe OSA w/ AHI=43.1 and desat to 78% (he spent 331min <90%); CPAP titration study done 04/30/16 >  Optimal CPAP = 11cmH2O w/ AHI=3.3 on CPAP, min O2sat on CPAP= 86%, EKG showed NSR w/ PVCs, PLMS=125 .  07/23/2016 Follow up : OSA  Patient returns for a follow-up for sleep apnea. Agents wife had noticed some possible witnessed apneic events. He was set up for a home sleep study that showed severe obstructive sleep apnea with an AHI at 43.1 and SPO2 low at 78%. He spent 378 minutes below 90% O2 saturation. Patient was set up for an in lab CPAP titration study. This showed optimal control at 11 cm of H2O. He developed emergent central apneas with higher pressure settings without improvement with transitional to BiPAP. He did not require supplemental oxygen with the study . He did have periodic limb movement. She was started on C Pap at 11 cm H2O .  Patient says since starting C Pap. He is doing okay. He is not crazy about wearing a but has been wearing a each night. Download shows excellent compliance with average usage at 4 hours. AHI at 9.8. Minimal leaks. Patient says he did start nasal pillows which he liked a lot better, however, had a lot of mouth breathing. He was then switched over to a full facemask. Patient says this is more uncomfortable. Switching back to nasal pillows with a chinstrap. He would like to use a smaller nasal pillow as the medium size did not fit very well. He says he feels rested.. Complain of a dry mouth. Does not notice any restless leg symptoms..   Past Medical History:  Diagnosis Date  . Hyperlipidemia   . Hypertension    Current Outpatient Prescriptions on File Prior to Visit  Medication  Sig Dispense Refill  . amLODipine (NORVASC) 10 MG tablet TAKE 1 TABLET EVERY DAY 90 tablet 1  . aspirin 81 MG tablet Take 81 mg by mouth daily.     Marland Kitchen b complex vitamins capsule Take 2 capsules by mouth daily.    . beta carotene w/minerals (OCUVITE) tablet Take 1 tablet by mouth daily.    . cholecalciferol (VITAMIN D) 1000 units tablet Take 2,000 Units by mouth daily.    . finasteride (PROSCAR) 5 MG tablet Take 5 mg by mouth daily.    Marland Kitchen lisinopril (PRINIVIL,ZESTRIL) 20 MG tablet TAKE 1 TABLET EVERY DAY 90 tablet 1  . metoprolol succinate (TOPROL-XL) 50 MG 24 hr tablet TAKE 1 TABLET DAILY WITH OR IMMEDIATELY FOLLOWING A MEAL 90 tablet 1  . Multiple Vitamin (MULTIVITAMIN) capsule Take 1 capsule by mouth daily.    . simvastatin (ZOCOR) 40 MG tablet TAKE 1 TABLET EVERY DAY 90 tablet 3  . tamsulosin (FLOMAX) 0.4 MG CAPS Take 0.8 mg by mouth daily after supper.      No current facility-administered medications on file prior to visit.       Review of Systems Constitutional:   No  weight loss, night sweats,  Fevers, chills, + fatigue, or  lassitude.  HEENT:   No headaches,  Difficulty swallowing,  Tooth/dental problems, or  Sore throat,  No sneezing, itching, ear ache, nasal congestion, post nasal drip,   CV:  No chest pain,  Orthopnea, PND, swelling in lower extremities, anasarca, dizziness, palpitations, syncope.   GI  No heartburn, indigestion, abdominal pain, nausea, vomiting, diarrhea, change in bowel habits, loss of appetite, bloody stools.   Resp: No shortness of breath with exertion or at rest.  No excess mucus, no productive cough,  No non-productive cough,  No coughing up of blood.  No change in color of mucus.  No wheezing.  No chest wall deformity  Skin: no rash or lesions.  GU: no dysuria, change in color of urine, no urgency or frequency.  No flank pain, no hematuria   MS:  No joint pain or swelling.  No decreased range of motion.  No back pain.  Psych:  No  change in mood or affect. No depression or anxiety.  No memory loss.         Objective:   Physical Exam Vitals:   07/23/16 0958  BP: 124/76  Pulse: 66  Temp: 98.2 F (36.8 C)  TempSrc: Oral  SpO2: 96%  Weight: 200 lb (90.7 kg)  Height: 5\' 10"  (1.778 m)  Body mass index is 28.7 kg/m.   GEN: A/Ox3; pleasant , NAD, well nourished , + smoke smell    HEENT:  [place]/AT,  EACs-clear, TMs-wnl, NOSE-clear, THROAT-clear, no lesions, no postnasal drip or exudate noted. Class 2-3 MP airway   NECK:  Supple w/ fair ROM; no JVD; normal carotid impulses w/o bruits; no thyromegaly or nodules palpated; no lymphadenopathy.    RESP  Clear  P & A; w/o, wheezes/ rales/ or rhonchi. no accessory muscle use, no dullness to percussion  CARD:  RRR, no m/r/g  , tr  peripheral edema, pulses intact, no cyanosis or clubbing.  GI:   Soft & nt; nml bowel sounds; no organomegaly or masses detected.   Musco: Warm bil, no deformities or joint swelling noted.   Neuro: alert, no focal deficits noted.    Skin: Warm, no lesions or rashes  Tammy Parrett NP-C  [facility]  07/23/2016

## 2016-07-23 NOTE — Patient Instructions (Addendum)
Use Saline nasal spray and gel As needed   Change to small nasal pillows with chin strap .  CPAP Download in 6 weeks .  Follow up Dr. Halford Chessman  In 3 months for sleep apnea and As needed

## 2016-07-24 NOTE — Progress Notes (Signed)
Reviewed and agree with assessment/plan.  Chesley Mires, MD [facility] 07/24/2016, 2:47 PM Pager:  [phone]

## 2016-08-06 DIAGNOSIS — Z23 Encounter for immunization: Secondary | ICD-10-CM | POA: Diagnosis not present

## 2016-10-17 ENCOUNTER — Other Ambulatory Visit: Payer: Self-pay | Admitting: Pulmonary Disease

## 2016-11-14 ENCOUNTER — Institutional Professional Consult (permissible substitution): Payer: Medicare Other | Admitting: Pulmonary Disease

## 2016-12-04 DIAGNOSIS — N401 Enlarged prostate with lower urinary tract symptoms: Secondary | ICD-10-CM | POA: Diagnosis not present

## 2016-12-04 DIAGNOSIS — Z8551 Personal history of malignant neoplasm of bladder: Secondary | ICD-10-CM | POA: Diagnosis not present

## 2016-12-04 DIAGNOSIS — R3914 Feeling of incomplete bladder emptying: Secondary | ICD-10-CM | POA: Diagnosis not present

## 2016-12-16 ENCOUNTER — Other Ambulatory Visit: Payer: Self-pay | Admitting: Pulmonary Disease

## 2017-01-16 DIAGNOSIS — M722 Plantar fascial fibromatosis: Secondary | ICD-10-CM | POA: Diagnosis not present

## 2017-01-16 DIAGNOSIS — M7732 Calcaneal spur, left foot: Secondary | ICD-10-CM | POA: Diagnosis not present

## 2017-01-16 DIAGNOSIS — M71572 Other bursitis, not elsewhere classified, left ankle and foot: Secondary | ICD-10-CM | POA: Diagnosis not present

## 2017-01-20 DIAGNOSIS — Z961 Presence of intraocular lens: Secondary | ICD-10-CM | POA: Diagnosis not present

## 2017-01-20 DIAGNOSIS — H524 Presbyopia: Secondary | ICD-10-CM | POA: Diagnosis not present

## 2017-01-21 DIAGNOSIS — M722 Plantar fascial fibromatosis: Secondary | ICD-10-CM | POA: Diagnosis not present

## 2017-01-21 DIAGNOSIS — M71571 Other bursitis, not elsewhere classified, right ankle and foot: Secondary | ICD-10-CM | POA: Diagnosis not present

## 2017-01-23 DIAGNOSIS — E531 Pyridoxine deficiency: Secondary | ICD-10-CM | POA: Diagnosis not present

## 2017-01-23 DIAGNOSIS — M5417 Radiculopathy, lumbosacral region: Secondary | ICD-10-CM | POA: Diagnosis not present

## 2017-01-23 DIAGNOSIS — R202 Paresthesia of skin: Secondary | ICD-10-CM | POA: Diagnosis not present

## 2017-01-23 DIAGNOSIS — M79604 Pain in right leg: Secondary | ICD-10-CM | POA: Diagnosis not present

## 2017-01-23 DIAGNOSIS — G5603 Carpal tunnel syndrome, bilateral upper limbs: Secondary | ICD-10-CM | POA: Diagnosis not present

## 2017-01-23 DIAGNOSIS — R634 Abnormal weight loss: Secondary | ICD-10-CM | POA: Diagnosis not present

## 2017-01-23 DIAGNOSIS — E559 Vitamin D deficiency, unspecified: Secondary | ICD-10-CM | POA: Diagnosis not present

## 2017-01-23 DIAGNOSIS — G603 Idiopathic progressive neuropathy: Secondary | ICD-10-CM | POA: Diagnosis not present

## 2017-01-23 DIAGNOSIS — G609 Hereditary and idiopathic neuropathy, unspecified: Secondary | ICD-10-CM | POA: Diagnosis not present

## 2017-01-29 DIAGNOSIS — M722 Plantar fascial fibromatosis: Secondary | ICD-10-CM | POA: Diagnosis not present

## 2017-01-29 DIAGNOSIS — M71572 Other bursitis, not elsewhere classified, left ankle and foot: Secondary | ICD-10-CM | POA: Diagnosis not present

## 2017-02-06 DIAGNOSIS — M79604 Pain in right leg: Secondary | ICD-10-CM | POA: Diagnosis not present

## 2017-02-06 DIAGNOSIS — G5603 Carpal tunnel syndrome, bilateral upper limbs: Secondary | ICD-10-CM | POA: Diagnosis not present

## 2017-02-06 DIAGNOSIS — M5417 Radiculopathy, lumbosacral region: Secondary | ICD-10-CM | POA: Diagnosis not present

## 2017-02-06 DIAGNOSIS — M25551 Pain in right hip: Secondary | ICD-10-CM | POA: Diagnosis not present

## 2017-02-06 DIAGNOSIS — G603 Idiopathic progressive neuropathy: Secondary | ICD-10-CM | POA: Diagnosis not present

## 2017-02-06 DIAGNOSIS — R202 Paresthesia of skin: Secondary | ICD-10-CM | POA: Diagnosis not present

## 2017-02-25 DIAGNOSIS — R350 Frequency of micturition: Secondary | ICD-10-CM | POA: Diagnosis not present

## 2017-02-25 DIAGNOSIS — R8271 Bacteriuria: Secondary | ICD-10-CM | POA: Diagnosis not present

## 2017-03-03 ENCOUNTER — Other Ambulatory Visit: Payer: Self-pay | Admitting: Pulmonary Disease

## 2017-03-06 DIAGNOSIS — M79604 Pain in right leg: Secondary | ICD-10-CM | POA: Diagnosis not present

## 2017-03-06 DIAGNOSIS — G603 Idiopathic progressive neuropathy: Secondary | ICD-10-CM | POA: Diagnosis not present

## 2017-03-06 DIAGNOSIS — R202 Paresthesia of skin: Secondary | ICD-10-CM | POA: Diagnosis not present

## 2017-03-06 DIAGNOSIS — M545 Low back pain: Secondary | ICD-10-CM | POA: Diagnosis not present

## 2017-03-06 DIAGNOSIS — G5603 Carpal tunnel syndrome, bilateral upper limbs: Secondary | ICD-10-CM | POA: Diagnosis not present

## 2017-03-11 ENCOUNTER — Ambulatory Visit (INDEPENDENT_AMBULATORY_CARE_PROVIDER_SITE_OTHER)
Admission: RE | Admit: 2017-03-11 | Discharge: 2017-03-11 | Disposition: A | Discharge status: Home / Self Care | Payer: Medicare Other | Source: Ambulatory Visit | Attending: Pulmonary Disease | Admitting: Pulmonary Disease

## 2017-03-11 ENCOUNTER — Other Ambulatory Visit (INDEPENDENT_AMBULATORY_CARE_PROVIDER_SITE_OTHER): Payer: Medicare Other

## 2017-03-11 ENCOUNTER — Ambulatory Visit (INDEPENDENT_AMBULATORY_CARE_PROVIDER_SITE_OTHER): Payer: Medicare Other | Admitting: Pulmonary Disease

## 2017-03-11 ENCOUNTER — Encounter: Payer: Self-pay | Admitting: Pulmonary Disease

## 2017-03-11 VITALS — BP 122/80 | HR 73 | Temp 97.4°F | Ht 70.0 in | Wt 191.0 lb

## 2017-03-11 DIAGNOSIS — I10 Essential (primary) hypertension: Secondary | ICD-10-CM

## 2017-03-11 DIAGNOSIS — I872 Venous insufficiency (chronic) (peripheral): Secondary | ICD-10-CM | POA: Diagnosis not present

## 2017-03-11 DIAGNOSIS — G4733 Obstructive sleep apnea (adult) (pediatric): Secondary | ICD-10-CM

## 2017-03-11 DIAGNOSIS — E7800 Pure hypercholesterolemia, unspecified: Secondary | ICD-10-CM | POA: Diagnosis not present

## 2017-03-11 DIAGNOSIS — M159 Polyosteoarthritis, unspecified: Secondary | ICD-10-CM

## 2017-03-11 DIAGNOSIS — E559 Vitamin D deficiency, unspecified: Secondary | ICD-10-CM

## 2017-03-11 DIAGNOSIS — F419 Anxiety disorder, unspecified: Secondary | ICD-10-CM | POA: Diagnosis not present

## 2017-03-11 DIAGNOSIS — J449 Chronic obstructive pulmonary disease, unspecified: Secondary | ICD-10-CM | POA: Diagnosis not present

## 2017-03-11 DIAGNOSIS — J410 Simple chronic bronchitis: Secondary | ICD-10-CM

## 2017-03-11 DIAGNOSIS — N138 Other obstructive and reflux uropathy: Secondary | ICD-10-CM

## 2017-03-11 DIAGNOSIS — C679 Malignant neoplasm of bladder, unspecified: Secondary | ICD-10-CM

## 2017-03-11 DIAGNOSIS — N401 Enlarged prostate with lower urinary tract symptoms: Secondary | ICD-10-CM

## 2017-03-11 DIAGNOSIS — M150 Primary generalized (osteo)arthritis: Secondary | ICD-10-CM

## 2017-03-11 LAB — TSH: TSH: 0.55 u[IU]/mL (ref 0.35–4.50)

## 2017-03-11 LAB — CBC WITH DIFFERENTIAL/PLATELET
BASOS ABS: 0 10*3/uL (ref 0.0–0.1)
Basophils Relative: 0.4 % (ref 0.0–3.0)
EOS ABS: 0.1 10*3/uL (ref 0.0–0.7)
Eosinophils Relative: 1.1 % (ref 0.0–5.0)
HCT: 48.7 % (ref 39.0–52.0)
Hemoglobin: 16.4 g/dL (ref 13.0–17.0)
LYMPHS ABS: 1.5 10*3/uL (ref 0.7–4.0)
Lymphocytes Relative: 21.5 % (ref 12.0–46.0)
MCHC: 33.7 g/dL (ref 30.0–36.0)
MCV: 95.3 fl (ref 78.0–100.0)
MONO ABS: 0.7 10*3/uL (ref 0.1–1.0)
MONOS PCT: 9.2 % (ref 3.0–12.0)
NEUTROS ABS: 4.8 10*3/uL (ref 1.4–7.7)
NEUTROS PCT: 67.8 % (ref 43.0–77.0)
PLATELETS: 244 10*3/uL (ref 150.0–400.0)
RBC: 5.11 Mil/uL (ref 4.22–5.81)
RDW: 15.4 % (ref 11.5–15.5)
WBC: 7.1 10*3/uL (ref 4.0–10.5)

## 2017-03-11 LAB — COMPREHENSIVE METABOLIC PANEL
ALK PHOS: 45 U/L (ref 39–117)
ALT: 22 U/L (ref 0–53)
AST: 15 U/L (ref 0–37)
Albumin: 3.9 g/dL (ref 3.5–5.2)
BUN: 26 mg/dL — AB (ref 6–23)
CO2: 31 mEq/L (ref 19–32)
Calcium: 9.4 mg/dL (ref 8.4–10.5)
Chloride: 105 mEq/L (ref 96–112)
Creatinine, Ser: 1.18 mg/dL (ref 0.40–1.50)
GFR: 63.66 mL/min (ref >60.00)
GLUCOSE: 103 mg/dL — AB (ref 70–99)
POTASSIUM: 4.7 meq/L (ref 3.5–5.1)
Sodium: 141 mEq/L (ref 135–145)
TOTAL PROTEIN: 6.7 g/dL (ref 6.0–8.3)
Total Bilirubin: 0.7 mg/dL (ref 0.2–1.2)

## 2017-03-11 LAB — VITAMIN D 25 HYDROXY (VIT D DEFICIENCY, FRACTURES): VITD: 54.51 ng/mL (ref 30.00–100.00)

## 2017-03-11 LAB — LIPID PANEL
Cholesterol: 151 mg/dL (ref 0–200)
HDL: 88.4 mg/dL (ref >39.00)
LDL CALC: 54 mg/dL (ref 0–99)
NonHDL: 62.6
Total CHOL/HDL Ratio: 2
Triglycerides: 43 mg/dL (ref 0.0–149.0)
VLDL: 8.6 mg/dL (ref 0.0–40.0)

## 2017-03-11 MED ORDER — LOSARTAN POTASSIUM 100 MG PO TABS: 100.0000 mg | ORAL_TABLET | Freq: Every day | ORAL | 3 refills | Status: DC

## 2017-03-11 NOTE — Patient Instructions (Signed)
Today we updated your med list in our EPIC system...    Continue your current medications the same...  We decided to change your LISINOPRIL to LOSARTAN 100mg  tab take one tab daily...    Monitor your BP & call w/ any issues...  Today we checked a follow up CXR & FASTING blood work... We will sched another HOME SLEEP TEST to check your status at this time...    We will contact you w/ the results when available...   Stay as active as poss...  Call for any questions or if we can be of service in any way.Marland KitchenMarland Kitchen

## 2017-03-11 NOTE — Progress Notes (Addendum)
Subjective:     Patient ID: Jeremy [name], male   DOB: 01-03-1941, 76 y.o.   MRN: [id number]  HPI 76 y/o WM here for a follow up visit... he has multiple medical problems as noted below...     CXR 3/13 showed normal heart size, COPD w/ hyperinflation, clear/ NAD.Marland KitchenMarland Kitchen  EKG 3/13 showed NSR, rate66, ?LAE, otherw WNL.Marland KitchenMarland Kitchen  LABS 3/13:  FLP- at goals on Simva40;  Chems- wnl w/ BS110;  CBC- wnl;  TSH=0.88;  PSA=3.87  ~  January 25, 2013:  Yearly ROV & Jeremy [name] has had a good yr except for some recurrent UTIs which prompted a Urology f/u w/ DrOttelin & Borden> eval revealed worsening BPH/ LUTS and PVR>600cc; started on Proscar & Flomax w/ improvement (voiding better, decr nocturia, stream improved)... We reviewed the following medical problems during today's office visit >>     Ex-cig smoker> he quit regular cigs >72yr ago & uses occas E-cig/ cigar; denies cough, sputum, SOB, & he remains active w/ walking & golfs regularly...    HBP> on MetopXL50, Norvasc10, Lisinopril20; BP= 128/68 & similar at home; denies CP/angina, palpit, SOB or edema...    Cholesterol> on Simva40; FLP 3/14 shows TChol 143, TG 44, HDL 57, LDL 78; continue same...    GI (divertics, polyps,hems) stable & f/u colon done 11/13 by DrDBrodie- mod divertics, 2 sessile polyps w/ path revealing benign mucosa & f/u rec 123yr    GU- hx of Bladder Ca, BPH, UTIs, ED>  followed by DrOttelin/Borden & seen 1/14 w/ recurrent UTIs, signif PVR>600, & treated w/ Proscar5 & Flomax0.4 w/ sigif improvement in symptoms; cysto was otherw neg & PSA= 4.46 w/ 30%free...    Hx DJD & Vit D defic> followed by DrAlusio & he's taking 1000u VitD supplement... We reviewed prob list, meds, xrays and labs> see below for updates >>   CXR 3/14 showed norm heart size, clear lungs w/ min basilar scarring, NAD...  LABS 3/14:  FLP- at goals on Simva40 (contin same);  Chems- ok x BS=128 (needs better diet to avoid DM meds);  CBC- wnl;  TSH=0.75...   ~  September 21, 2013:  17m67moOV & post hosp check> Jeremy [name] Adm 9/18-19 w/ "indigestion" pain is epig/ lower chest wall w/ radiation to back; no assoc n/v, no SOB or palpit etc; this was followed by chills & sweat prompting call to EMS & taken to ER; the discomfort resolved w/ time & PPI rx;  Eval revealed a neg exam, clear CXR x bibasilar atx, norm EKG, neg cardiac enz, 2DEcho wnl x gr1DD;  He took the PPI for awhile then stopped on his own w/o recurrent symptoms- denies abd pain, dysphagia, reflux, n/v, c/d, etc;  After disch a urine cult ret pos for >100K Enterococcus Faecalis, sens Levaquin & we treated him x7d for this prob;  He has had f/u DrBorden, Urology & he reports difficulty emptying his bladder & meds adjusted- Finasteride5 + Tamsulosin0.4 and improving...   [facility]...     We reviewed the following medical problems during today's office visit >> he had the 2014 Flu vaccine 9/14  EKG 9/14 showed NSR, rate60, WNL, NAD...  2DEcho 9/14 showed norm LV size & function w/ EF=55-60%, norm wall motion, Gr1DD, mild LA&RA dil, valves ok, RV ok...  ~  January 26, 2014:  30mo36mo & here for his annnual check-up> Jeremy [name] doing satis, [place]  [place] issues medically... He had Ectropion surg at [facility] 11/14... He has hearing aides from [facility]... We reviewed the following medical problems during today's office visit >>     Ex-cig smoker> he quit regular cigs >43yr ago & uses occas E-cig/ cigar; denies cough, sputum, SOB, & he remains active w/ walking & golfs regularly...    HBP> on MetopXL50, Norvasc10, Lisinopril20; BP= 110/68 & similar at home; denies CP/angina, palpit, SOB or edema...    Cholesterol> on Simva40; FLP 3/15 shows TChol 173, TG 103, HDL 56, LDL 97; continue same...    GI (divertics, polyps,hems) stable & f/u colon done 11/13 by DrDBrodie- mod divertics, 2 sessile polyps w/ path revealing benign mucosa & f/u rec 159yr    GU- hx of Bladder Ca, BPH, UTIs, ED>   followed by DrOttelin/Borden & seen 1/14 w/ recurrent UTIs, signif PVR>600, & treated w/ Proscar5 & Flomax0.4 w/ sigif improvement in symptoms; cysto was otherw neg & PSA= 4.46 w/ 30%free...    Hx DJD & Vit D defic> followed by DrAlusio & he's taking 1000u VitD supplement... We reviewed prob list, meds, xrays and labs> see below for updates >> Given Prevnar-13 today & Rx for Shingles shot...  LABS 3/15:  FLP- at goals on Simva40;  Chems- wnl;  CBC- wnl;  TSH=0.86...  ~  March 08, 2015:  1345moV & Jeremy [name] doing satis & no new complaints or concerns; [place]- he knows to avoid the sun, use sunscreen & he gets checke regularly by Derm-DrGruber;  He also gets checked Q6mo32moUrology-DrBorden & was seen 9/15 (note reviewed) and recently 4/16 (note pending)- he tells me that PSA was good & cysto was neg, they were concerned that he wasn't emptying his bladder well 7 increased his Flomax to 0.8;  Unfortunately Jeremy [name] some- he's decr to 1/4ppd + Ecig & I implored him to quit completely... We reviewed the following medical problems during today's office visit >>     cig smoker> he has returned to [name]- now 1/4ppd + Ecig; he knows the dangers and risks!  He notes min cough & clear sputum, no hemoptysis, & denies SOB (remains active w/ walking & golfs regularly)...    HBP> on ASA81, MetopXL50, Norvasc10, Lisinopril20; BP= 140/80 & better at home; denies CP/angina, palpit, SOB or edema...    Cholesterol> on Simva40; FLP 4/16 shows TChol 165, TG 80, HDL 60, LDL 89; continue same...    GI- divertics, polyps,hems> stable & f/u colon done 11/13 by DrDBrodie- mod divertics, 2 sessile polyps w/ path revealing benign mucosa & f/u rec 8yr66yr GU- hx of Bladder Ca, BPH, UTIs, ED>  followed by DrBorden & seen Q6mo 963mo& 4/16- note pending, pt reports PSA was good & Cysto- NEG; on Proscar5 & Flomax incr to 2 tabs daily for better emptying... Note- Cr=1.56,  copy to DrBorden, avoid NSAIDs, incr water intake...    Hx DJD & Vit D defic> followed by DrAlusio & he's taking 1000u VitD supplement... We reviewed prob list, meds, xrays and labs> see below for updates >>   CXR 4/16 showed norm heart size, clear lungs w/ mild bibasilar atx/ scarring, DJD Tspine, NAD...  LABS 4/16:  FLP- all at goals on Simva40;  Chems- ok x Cr=1.56;  CBC- wnl;  TSH=0.68...  ~  March 07, 2016:  65yr [id number] a good yr, doing well w/o new complaints or  concerns; however his wife is c/osleep apnea that she has witnessed & we will arrange for a home sleep test; he denies daytime sleepiness issues and wakes refreshed, denies sleep pressure during the day, need to nap, any prob w/ driving etc- SEL=9/53... We reviewed the following medical problems during today's office visit >>     R/O OSA>  We will sched a home sleep study for screening purposes...    Cig smoker> he continues to smoke cigs; he knows the dangers and risks!  He notes min cough & clear sputum, no hemoptysis, & denies SOB (remains active w/ walking & golfs regularly)...    HBP> on ASA81, MetopXL50, Norvasc10, Lisinopril20; BP= 138/80 & similar at home; denies CP/angina, palpit, SOB or edema...    Cholesterol> on Simva40; [place] 4/17 shows TChol 136, TG 67, HDL 60, LDL 63; continue same...    GI- divertics, polyps,hems> stable & f/u colon done 11/13 by DrDBrodie- mod divertics, 2 sessile polyps w/ path revealing benign mucosa & f/u rec 18yr.    GU- hx of Bladder Ca, BPH, UTIs, ED>  followed by DrBorden & seen Q613mo0/16 note reviewed & he has f/u tomorrow, pt reports PSA was good & Cysto- NEG; on Proscar5 & Flomax incr to 2 tabs daily for better emptying (PSA & cysto due now)...     Hx DJD, sp stenosis w/ bilat leg weakness, & Vit D defic> followed by DrAlusio & Neuro- DrPatel rec phys therapy/ rehab- pt notes that 2 Super-B vits and 2 VitD pills are really helping his legs! Pain resolved and aching returns off these  supplements. EXAM shows Afeb, VSS, O2sat=94% on RA;  HEENT- neg, mallamapti1, sl raspy voice;  Chest- clear w/o w/r/r;  Heart- RR w/o m/r/g;  Abd- soft, nontender, neg;  Ext- neg w/o c/c/e;  Neuro- intact...   CXR 03/07/16>  Norm heart size, Ao atherosclerosis, sl overinflation & flat diaphragms, clear lungs, degen changes in Tspine...  EKG 03/07/16>  NSR, rate66, wnl/ NAD...  LABS 03/07/16>  FLP- at goals on Simva40;  Chems- ok x FBS=110, BUN=24, Cr=1.26 (GFR60);  CBC- wnl;  TSH=0.94;  VitD=45 IMP/PLAN>>  We will arrange for a home sleep study;  Must quit all [name];  Continue current meds...  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 ADDENDUM>>  CPAP titration study done 04/30/16 & read by DrSood 05/23/16>  Optimal CPAP = 11cmH2O w/ AHI=3.3 on CPAP, min O2sat on CPAP= 86%, EKG showed NSR w/ PVCs, PLMS=125 w/ Index=25.6/Hr & we will assess pt for RLS...   ~  Mar 11, 2017:  73yr20yr & medical follow up>  JohnPerleer [facility] last yr ([place] Barrackshe tells me that he hasn't even used it for the last several months;  He says he has a new bed, wife tells him he is much quieter sleeping now & not waking her up, says he wakes refreshed now, no naps, and no daytime hypersomnolence;  He reports that he sleeps from 10P to 6A, one arousal & able to get back to sleep ok; In addition he has lost 17# on diet; we did an online CPAP download today- last avail was 10/17 - 09/25/16 w/ poor compliance (17/30d, all <4H/night) and not effective;  We discussed all these issues and decided to proceed w/ a Home Sleep Test to re-assess his current needs... we reviewed the following medical problems during today's office visit >>     OSA, nocturnal hypoxemia, RLS>  We will sched another home  sleep study in light of his prev difficulty, decr in symptoms, and wt reduction...    Cig smoker> he continues to smoke cigs- prev 4/d + vape, says none for 2wks; he knows the dangers and risks!  He notes min cough & clear sputum, no hemoptysis, & denies SOB (remains active w/ walking & golfs regularly)...    HBP> on ASA81, MetopXL50, Norvasc10, Lisinopril20; BP= 122/80 & similar at home; denies CP/angina, palpit, SOB or edema; we decided to switch Lisin20=> Losartan100/d...    Cholesterol> on Simva40; FLP 5/18 shows TChol 151, TG 43, HDL 88, LDL 54; continue same...    GI- divertics, polyps,hems> stable & f/u colon done 11/13 by DrDBrodie- mod divertics, 2 sessile polyps w/ path revealing benign mucosa & f/u rec 41yr.    GU- hx of Bladder Ca, BPH, UTIs, ED>  followed by DrBorden & seen Q641molast seen 12/04/16 & note reviewed; now voids occas & self-caths Bid, on Tamsulosin-2Qhs & off Finasteride; cysto showed severe prostatic hypertrophy w/ obstuction; no lesions seen...    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 Pain resolved and aching returns off these supplements.    [facility]> pt c/o right buttock & hip pain radiating down RLE but no LBP- Dx w/ lumbar radiculopathy & treated w/ lumbar trigger pt injection;  EXAM shows Afeb, VSS, O2sat=94% on RA;  HEENT- neg, mallamapti1, sl raspy voice;  Chest- clear w/o w/r/r;  Heart- RR w/o m/r/g;  Abd- soft, nontender, neg;  Ext- neg w/o c/c/e;  Neuro- intact...   CXR 03/11/17 (independently reviewed by me in the PACS system) shows norm heart size, mild hyperinflation, clear lungs- NAD...  LABS 03/11/17>  FLP- all parameters at goals on Simva40;  Chems- ok w/ BS=103, Cr=1.18, LFTs wnl;  CBC- wnl w/ Hg=16.4;  TSH=0.55;  VitD=55 on 1000u/d... IMP/PLAN>>   Jeremy [name]'s CXR & labs are OK- continue same meds;  He will also continue f/u w/ his Urologist for bladder issues and Neurologist for leg pain;  His OSA/ CPAP status is all mixed up- suggest new home sleep test in light of his prev results, decr in observed signs, and recent wt loss...   ADDENDUM>>  Home Sleep Test done 04/02/17 (6.5H study) & showed AHI=25/hr, mixed and assoc w/ numerous desaturations- ave=86% and nadir O2sat=74%... Recall that prev Home Sleep Test 03/21/16 showed AHI=43/hr and O2desat to 78%; he then had an in-lab CPAP Titration test 04/30/16 showing optimal CPAP = 11cmH2O pressure w/ AHI=3.3/hr & min O2sat=86%... Odie tells me that he worked diligently w/ AHZeelando find the proper mask fit for him- unable to use nasal pillows or nasal mask due to him being a mouth breather; tried all the diff face masks but found that his mouth/throat were very dry despite 1005 humidity & he was awakening Q3077mw/ the discomfort;  His only CPAP download 08/2016 showed poor compliance & no benefit; he stopped the CPAP on his own & did not ret for f/u until his 2018 physical 03/11/17 w/ suggestion to  repeat his sleep test since he felt that he was resting much better after wt loss & new bed... Since the recent sleep test showed mod mixed apneas w/ desat to 74% I have suggested Sleep consult w/ DrSood to see if he has any additional suggestions/ tricks/ etc            PROBLEM LIST:    OSA >> Wife noted nocturnal apneas and pt mentioned this to me 02/2016 physical;  Home sleep test w/ severe OSA AHI=43.1/hr & lowest O2sat=78%;  We will order ASAP CPAP titration in the sleep lab for this & to check to see if he need nocturnal O2...  ~  6/17>  CPAP titration study done 04/30/16 & read by DrSood 05/23/16>  Optimal CPAP = 11cmH2O w/ AHI=3.3 on CPAP, min O2sat on CPAP= 86%, EKG showed NSR w/ PVCs, PLMS=125 w/ Index=25.6/Hr & we will assess pt for RLS => he never returned to see me after starting on CPAP... ~  5/18>  He  indicates that he stopped CPAP on his own ?due to difficulty w/ the mask etc; states he has a new bed, wife says much quieter & not waking her up, pt says wakes refreshed w/o daytime hypersomnolence => we discussed another Home Sleep Test to re-assess...    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 ~  3/13: he tells me he quit regular cigs >84yrago & finally quit the E-cig 669mogo; admits to an occas cigar; denies cough, sputum, SOB, & he remains active w/ walking & golf... ~  3/14: he quit regular cigs >2y23yrgo & uses occas E-cig/ cigar; denies cough, sputum, SOB, & he remains active w/ walking & golfs regularly... ~  11/14: he has fallen off the wagon & was [name] again, quit again, & now back to Ecig; advised to quit completely, reminded about his bladder cancer & I told him lung ca is a lot worse! He declines [name] cessation help... ~  3/15: he [place]; again he is asked to quit completely... ~  4/16: he is back to 1/4ppd + Ecig & asked to stop completely- he knows the risks etc... ~  4/17: he is still [name] despite all the risks...  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 uses Mucinex as needed. ~  CXR 11/09 showed mild COPD changes, NAD... Marland Kitchen  CXR 3/11 showed some scarring at the bases, no change... ~  CXR 3/12 showed chr changes, incr markings, basilar scarring, NAD. ~  CXR 3/13 showed normal heart size, COPD w/ hyperinflation, clear/ NAD... Marland Kitchen  CXR 3/14 showed norm heart size, clear lungs w/ min basilar scarring, NAD. ~  CXR 9/14 (hosp for atypCP, reflux & UTI) showed norm heart size, bibasilar atx, otherw clear, NAD... Marland Kitchen  CXR 4/16 showed norm heart size, clear lungs w/ mild bibasilar atx/ scarring, DJD Tspine, NAD ~  CXR 4/17 showed norm heart size, Ao atherosclerosis, sl overinflation & flat diaphragms, clear lungs, degen changes in Tspine.  HYPERTENSION  (ICD-401.9) - on METOPROLOL XR 65m107m  AMLODIPINE 10mg40m LISINOPRIL 20mg/1m  he also takes ASA 81mg/d35mhe has seen DrWall ([place]) in the past... BP= 134/60 & similar at home, takes meds regularly & tolerates well... denies HA, fatigue, visual changes, CP, palipit, dizziness, syncope, dyspnea, edema, etc...  ~  NuclearStressTest 8/07 showed a hypertensive response but no scar or ischemia, EF= 63%...Marland KitchenMarland Kitchen  no change from 2002 study... Toprol added... ~  EKG 9/14 showed NSR, rate60, WNL, NAD... ~  2DEcho 9/14 showed norm LV cavity size & wall thickness, norm wall motion & sys function w/ EF=55-60%, Gr1DD, mild RA&LA dil, valves looked ok... ~  3/15: on MetopXL50, Norvasc10, Lisinopril20; BP= 110/68 & similar at home; denies CP/angina, palpit, SOB or edema  ~  4/16: on ASA81, MetopXL50, Norvasc10, Lisinopril20; BP= 140/80 & better at home; denies CP/angina, palpit, SOB or edema. ~  4/17: on ASA81, MetopXL50, Norvasc10, Lisinopril20; BP= 138/80 & similar at home; he remains asymptomatic...  VENOUS INSUFFICIENCY (ICD-459.81) - he has mod VI & intermittent min edema... he knows to elim salt, elevate, wear support hose, etc...  HYPERCHOLESTEROLEMIA (ICD-272.0) - on SIMVASTATIN 65m/d...  ~  [place]Law[place] showed TChol 144, TG 64, HDL 41, LDL 90...  ~  FBronson12/09 showed TChol 173, TG 82, HDL 51, LDL 106... rec- same med + better diet. ~  FLP 3/11 showed TChol 158, TG 116, HDL 54, LDL 81 ~  FLP 3/12 on Simva40 showed TChol 155, TG 55, HDL 54, LDL 90 ~  FLP 3/13 on Simva40 showed TChol 162, TG 79, HDL 56, LDL 90 ~  FLP 3/14 on Simva40 showed TChol 143, TG 44, HDL 57, LDL 78 ~  FLP 9/14 in hosp showed TChol 154, TG 51, HDL 63, LDL 81... rec to continue Simva40. ~  FLP 3/15 on simva40 showed TChol 173, TG 103, HDL 56, LDL 97 ~  FLP 4/16 on Simva40 showed TChol 165, TG 80, HDL 60, LDL 89; continue same ~  FLP 4/17 on Simva40 showed TChol 136, TG 67, HDL 60, LDL 63; continue same...  OVERWEIGHT (ICD-278.02) -   ~  weight 11/09 = 212# since he cut way back on [name]... we discussed diet + exercise etc... ~  weight 3/11 = 207# ~  weight 3/12 = 211# ~  Weight 3/13 = 216# ~  Weight 3/14 = 211# ~  Weight 11/14 = 208# ~  Weight 3/15 = 214# ~  Weight 4/16 = 216# ~  Weight 4/17 = 208#  DIVERTICULOSIS OF COLON (ICD-562.10),  COLONIC POLYPS (ICD-211.3),  & HEMORRHOIDS (ICD-455.6) -  - last colonoscopy 9/08 by DrDBrodie showed several 3-492mpolyps & divertics... path= adenomatous and f/u planned in 5 yrs... ~  F/u colonoscopy 11/13 by DrDBrodie showed 2 sessile polyps in desc colon, mod divertics, path= mult fragments of benign polypoid colorectal mucosa  GALLSTONES (ICD-574.20) - seen on prev CT Abd done by Urology...  Repeated URINARY TRACT INFECTIONS >> had Enterococcus UTI 1/14 treated by Urology w/ Levaquin... 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 he gets cystoscopies less often now...BPH w/ mild obstructive symptoms... intermittently elevated PSA on testing in the past... ~  labs 3/11 showed PSA = 3.64 ~  labs 3/12 showed PSA= 3.98 ~  Labs 3/13 showed PSA= 3.87 ~  Urology f/u DrBorden 1/14> Hx UTIs, Bladder Cancer, BPH/LTOS, & ED- on Proscar5 & Flomax0.4; they did cysto (neg) & PVR was 600cc, symptoms better on Rx now; they did PSA 1/14= 4.46 (30%free)...  ~  9/14> hosp w/ epig & lower CP w/ rad to back; urine grew Enterococcus & treated again w/ Levaquin; Urology f/u DrBorden- hx bladder ca, neg cysto 1/14, BPH/LUTS w/ PVR>600 in  past now improved on Finasteride & Flomax... ~  9/15: he had f/u DrBorden- urothelial ca of bladder, BPH/LUTS, ED, f/u PSA; felt to be stable... ~  4/16: he had 41moROV DrBorden & pt reports that PSA was good, & cysto was neg; they incr his Tamsulosin to 0.8 to help his  bladder emptying... ~  He continues to follow w/ DrBorden every 620mo they do PSA and surveillance cystos...  DEGENERATIVE JOINT DISEASE (ICD-715.90) - eval for left knee pain by DrAlusio w/ torn cartilage & baker's cyst... knee tapped, given shot w/ relief... 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 ~  labs 11/09 showed Vit D level = 22... rec to start 2000 u daily. ~  labs 3/11 showed Vit D level = 50... continue supplemental Vit D at 1000 u daily.  Health Maintenance: ~  Immunizations:  he gets the seasonal flu vaccine yearly;  had the PNEUMOVAX 2009 (age66);   TDAP 3/11...   Past Surgical History:  Procedure Laterality Date  . SKIN CANCER EXCISION     Surg x2...  . TONSILLECTOMY AND ADENOIDECTOMY     Surg as a child...  [facility] DrYe[facility]...   Outpatient Encounter Prescriptions as of 03/11/2017  Medication Sig  . amLODipine (NORVASC) 10 MG tablet TAKE 1 TABLET EVERY DAY  . aspirin 81 MG tablet Take 81 mg by mouth daily.   . Marland Kitchen complex vitamins capsule Take 2 capsules by mouth daily.  . beta carotene w/minerals (OCUVITE) tablet Take 1 tablet by mouth daily.  . cholecalciferol (VITAMIN D) 1000 units tablet Take 2,000 Units by mouth daily.  . metoprolol succinate (TOPROL-XL) 50 MG 24 hr tablet TAKE 1 TABLET DAILY WITH OR IMMEDIATELY FOLLOWING A MEAL  . Multiple Vitamin (MULTIVITAMIN) capsule Take 1 capsule by mouth daily.  . simvastatin (ZOCOR) 40 MG tablet TAKE 1 TABLET EVERY DAY  . tamsulosin (FLOMAX) 0.4 MG CAPS Take 0.8 mg by mouth daily after supper.   . [DISCONTINUED] lisinopril (PRINIVIL,ZESTRIL) 20 MG tablet TAKE 1 TABLET EVERY DAY  . losartan (COZAAR) 100 MG tablet Take 1 tablet (100 mg total) by mouth daily.  .  [DISCONTINUED] finasteride (PROSCAR) 5 MG tablet Take 5 mg by mouth daily.   No facility-administered encounter medications on file as of 03/11/2017.     Allergies  Allergen Reactions  . Sulfa Antibiotics     Rash, light headed, shaking  . Penicillins     REACTION: hives severe as a child    Immunization History  Administered Date(s) Administered  . Influenza Split 10/01/2011, 09/12/2012, 08/07/2014  . Influenza Whole 09/11/2009, 09/02/2010  . Influenza,inj,Quad PF,36+ Mos 07/30/2013, 09/12/2015, 10/11/2016  . Pneumococcal Conjugate-13 01/26/2014  . Pneumococcal Polysaccharide-23 10/10/2008  . Td 01/15/2010    Current Medications, Allergies, Past Medical History, Past Surgical History, Family History, and Social History were reviewed in [employer].    Review of Systems    Constitutional:  Denies F/C/S, anorexia, unexpected weight change. HEENT:  No HA, visual changes, earache, nasal symptoms, sore throat, hoarseness. Resp:  No cough, sputum, hemoptysis; no SOB, tightness, wheezing. Cardio:  No CP, palpit, DOE, orthopnea, edema. GI:  Denies N/V/D/C or blood in stool; no  reflux, abd pain, distention, or gas. GU:  No dysuria, freq, urgency, hematuria, or flank pain. MS:  Denies joint pain, swelling, tenderness, or decr ROM; no neck pain, back pain, etc. Neuro:  No tremors, seizures, dizziness, syncope, weakness, numbness, gait abn. Skin:  No suspicious lesions or skin rash. Heme:  No adenopathy, bruising, bleeding. Psyche: Denies confusion, sleep disturbance, hallucinations, anxiety, depression.   Objective:   Physical Exam    WD, Overweight, 76 y/o WM in NAD... GENERAL:  Alert & oriented; pleasant & cooperative... HEENT:  [place]/AT, EOM-wnl, PERRLA, EACs-clear, TMs-wnl, NOSE-clear, THROAT-clear & wnl. NECK:  Supple w/ fairROM; no JVD; normal carotid impulses w/o bruits; no thyromegaly or nodules palpated; no lymphadenopathy. CHEST:  Clear to P &  A; without wheezes/ rales/ or rhonchi heard... HEART:  Regular Rhythm; without murmurs/ rubs/ or gallops detected... ABDOMEN:  Soft & nontender; normal bowel sounds; no organomegaly or masses palpated... EXT: without deformities, mild arthritic changes; no varicose veins, +ven insuffic, tr edema... NEURO:  CN's intact; motor testing normal; sensory testing normal; gait normal & balance OK. DERM:  No lesions noted; no rash etc...  RADIOLOGY DATA:  Reviewed in the EPIC EMR & discussed w/ the patient...  LABORATORY DATA:  Reviewed in the EPIC EMR & discussed w/ the patient...     Assessment:      OSA>  +Home sleep test w/ AHI=43.1/hr & lowest O2sat=78%; CPAP titration test showed Optimal CPAP=11cmH2O w/ AHI=3.3 on CPAP, min O2sat on CPAP= 86%... 5/18>  ?he had trouble w/ the interface & never used the CPAP regularly, stopping it in the interval on his own;  We decided to check another Home sleep test to re-assess...   COPD/ smoker>  CXR w/ signs of underlying COPD, but he is asymptomatic, denies cough/ SOB/ etc; needs to incr activity/ exercise & must quit all [name]... 3/15>  Given Prevnar-13  HBP>  Controlled on his 3 meds, continue same...  CHOL>  FLP is at the goals on Simva40 + diet...  OVERWEIGHT>  We reviewed diet & exercise program...  GI> Divertics, Polyps>  He is up to date w/ screening colonoscopies; he uses the Protonix 35m prn....  Gallstones>  Asymptomatic stones seen on prev scan...  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  Other medical issues as noted...      Plan:     Patient's Medications  New Prescriptions   LOSARTAN (COZAAR) 100 MG TABLET    Take 1 tablet (100 mg total) by mouth daily.  Previous Medications   AMLODIPINE (NORVASC) 10 MG TABLET    TAKE 1 TABLET EVERY DAY    ASPIRIN 81 MG TABLET    Take 81 mg by mouth daily.    B COMPLEX VITAMINS CAPSULE    Take 2 capsules by mouth daily.   BETA CAROTENE W/MINERALS (OCUVITE) TABLET    Take 1 tablet by mouth daily.   CHOLECALCIFEROL (VITAMIN D) 1000 UNITS TABLET    Take 2,000 Units by mouth daily.   METOPROLOL SUCCINATE (TOPROL-XL) 50 MG 24 HR TABLET    TAKE 1 TABLET DAILY WITH OR IMMEDIATELY FOLLOWING A MEAL   MULTIPLE VITAMIN (MULTIVITAMIN) CAPSULE    Take 1 capsule by mouth daily.   SIMVASTATIN (ZOCOR) 40 MG TABLET    TAKE 1 TABLET EVERY DAY   TAMSULOSIN (FLOMAX) 0.4  MG CAPS    Take 0.8 mg by mouth daily after supper.   Modified Medications   No medications on file  Discontinued Medications   FINASTERIDE (PROSCAR) 5 MG TABLET    Take 5 mg by mouth daily.   LISINOPRIL (PRINIVIL,ZESTRIL) 20 MG TABLET    TAKE 1 TABLET EVERY DAY

## 2017-03-13 ENCOUNTER — Telehealth: Payer: Self-pay | Admitting: Pulmonary Disease

## 2017-03-13 NOTE — Telephone Encounter (Signed)
Please notify patient> LABS look good!  FLP- all parameters at goals on simva40, continue same...  Chems, CBC, Thyroid, VitD are all wnl- looks good  REC- continue same meds, diet, exercise, & no smoking  Please notify patient>   CXR shows norm heart size, essentially clear lungs- NAD...  REC- must quit all smoking forever!   pts wife stated that the pt was not at home but she will have him call us back.

## 2017-03-13 NOTE — Telephone Encounter (Signed)
Pt called back and he is aware of results per SN.

## 2017-04-02 DIAGNOSIS — G4733 Obstructive sleep apnea (adult) (pediatric): Secondary | ICD-10-CM | POA: Diagnosis not present

## 2017-04-03 DIAGNOSIS — R634 Abnormal weight loss: Secondary | ICD-10-CM | POA: Diagnosis not present

## 2017-04-03 DIAGNOSIS — R202 Paresthesia of skin: Secondary | ICD-10-CM | POA: Diagnosis not present

## 2017-04-03 DIAGNOSIS — G609 Hereditary and idiopathic neuropathy, unspecified: Secondary | ICD-10-CM | POA: Diagnosis not present

## 2017-04-04 ENCOUNTER — Other Ambulatory Visit: Payer: Self-pay | Admitting: *Deleted

## 2017-04-04 DIAGNOSIS — Z86018 Personal history of other benign neoplasm: Secondary | ICD-10-CM | POA: Diagnosis not present

## 2017-04-04 DIAGNOSIS — D485 Neoplasm of uncertain behavior of skin: Secondary | ICD-10-CM | POA: Diagnosis not present

## 2017-04-04 DIAGNOSIS — G4733 Obstructive sleep apnea (adult) (pediatric): Secondary | ICD-10-CM | POA: Diagnosis not present

## 2017-04-04 DIAGNOSIS — D225 Melanocytic nevi of trunk: Secondary | ICD-10-CM | POA: Diagnosis not present

## 2017-04-04 DIAGNOSIS — L820 Inflamed seborrheic keratosis: Secondary | ICD-10-CM | POA: Diagnosis not present

## 2017-04-04 DIAGNOSIS — Z85828 Personal history of other malignant neoplasm of skin: Secondary | ICD-10-CM | POA: Diagnosis not present

## 2017-04-04 DIAGNOSIS — D1800 Hemangioma unspecified site: Secondary | ICD-10-CM | POA: Diagnosis not present

## 2017-04-04 DIAGNOSIS — L821 Other seborrheic keratosis: Secondary | ICD-10-CM | POA: Diagnosis not present

## 2017-04-04 DIAGNOSIS — L570 Actinic keratosis: Secondary | ICD-10-CM | POA: Diagnosis not present

## 2017-04-10 DIAGNOSIS — G5603 Carpal tunnel syndrome, bilateral upper limbs: Secondary | ICD-10-CM | POA: Diagnosis not present

## 2017-04-10 DIAGNOSIS — G603 Idiopathic progressive neuropathy: Secondary | ICD-10-CM | POA: Diagnosis not present

## 2017-04-10 DIAGNOSIS — M5442 Lumbago with sciatica, left side: Secondary | ICD-10-CM | POA: Diagnosis not present

## 2017-04-10 DIAGNOSIS — R202 Paresthesia of skin: Secondary | ICD-10-CM | POA: Diagnosis not present

## 2017-04-10 DIAGNOSIS — M5441 Lumbago with sciatica, right side: Secondary | ICD-10-CM | POA: Diagnosis not present

## 2017-04-11 ENCOUNTER — Encounter: Payer: Self-pay | Admitting: Pulmonary Disease

## 2017-04-11 ENCOUNTER — Ambulatory Visit (INDEPENDENT_AMBULATORY_CARE_PROVIDER_SITE_OTHER): Payer: Medicare Other | Admitting: Pulmonary Disease

## 2017-04-11 DIAGNOSIS — G4733 Obstructive sleep apnea (adult) (pediatric): Secondary | ICD-10-CM | POA: Diagnosis not present

## 2017-04-11 NOTE — Addendum Note (Signed)
Addended by: Valerie Salts on: 04/11/2017 10:05 AM   Modules accepted: Orders

## 2017-04-11 NOTE — Assessment & Plan Note (Signed)
Severe OSA noted in 03/2016 that has decreased to moderate degree with 18 pound weight loss  We discussed following options -Call the sleep lab [phone] mask desensitization visit-specifically I want you to try the oro- nasal mask  -If this does not work, contact her dentist and see if you are candidate for using a dental appliance for obstructive sleep apnea  -Report back in 1 month to discuss  -If both options don't work, then we will simply observe you and repeat assessment in one year

## 2017-04-11 NOTE — Patient Instructions (Signed)
We discussed following options -Call the sleep lab [phone] mask desensitization visit-specifically I want you to try the oro- nasal mask  -If this does not work, contact her dentist and see if you are candidate for using a dental appliance for obstructive sleep apnea  -Report back in 1 month to discuss  -If both options don't work, then we will simply observe you and repeat assessment in one year

## 2017-04-11 NOTE — Progress Notes (Signed)
Subjective:    Patient ID: Jeremy [name]. [name], male    DOB: 1941/06/18, 76 y.o.   MRN: [id number]  HPI  76 year old smoker referred for management of obstructive sleep apnea. His wife had witnessed several apneas but he denies excessive daytime somnolence. Home sleep study in 03/2016 showed severe OSA with AHI of 43/hour and desaturation to 78%. He underwent CPAP titration study which showed that events were corrected with 11 cm with a full face mask. He was started on a full face mask which she trial for about one month and he states that he had the worst experience of his life. He tried nasal pillows and then migrated to chinstrap and also used a full face mask finally. He experienced increasing dryness and was unable to tolerate the mask and stopped using it.  He lost about 18 pounds in the last year and underwent a repeat home sleep study which showed AHI of 25/hour. Of note he is also a smoker and I do not see a spirometry documented. He again denies excessive daytime somnolence. Epworth sleepiness score is 6. Bedtime is around 10 PM, sleep latency is within minutes, he sleeps on his side with one pillow, reports one to 2 nocturnal awakenings. He has bladder problems and self catheterizes himself before bedtime. He is out of bed at 6 AM feeling refreshed, denies daytime naps. He is able to play 9 holes of golf  There is no history suggestive of cataplexy, sleep paralysis or parasomnias    Significant tests/ events reviewed  home sleep study 03/21/16 revealed severe OSA w/ AHI=43.1 and desat to 78% (he spent 310min <90%);   CPAP titration  04/30/16 >  Optimal 11cmH2O , PLMS=125 w/ Index=25.6/Hr   Home sleep study 04/02/16 AHI 25/hour    Past Medical History:  Diagnosis Date  . Hyperlipidemia   . Hypertension     Past Surgical History:  Procedure Laterality Date  . SKIN CANCER EXCISION     Surg x2...  . TONSILLECTOMY AND ADENOIDECTOMY     Surg as a child...     Allergies    Allergen Reactions  . Sulfa Antibiotics     Rash, light headed, shaking  . Penicillins     REACTION: hives severe as a child    Social History   Social History  . Marital status: Married    Spouse name: N/A  . Number of children: N/A  . Years of education: N/A   Occupational History  . Not on file.   Social History Main Topics  . Smoking status: Former Smoker    Packs/day: 0.80    Years: 50.00    Types: Cigarettes    Quit date: 06/12/2015  . Smokeless tobacco: Never Used     Comment: uses vapor cigarette  . Alcohol use 12.6 oz/week    21 Glasses of wine per week  . Drug use: No  . Sexual activity: Not on file   Other Topics Concern  . Not on file   Social History Narrative   Lives with wife in a 2 story home.  Has 2 children.  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 Colon cancer Neg Hx   . Stomach cancer Neg Hx   . Rectal cancer Neg Hx      Review of Systems  Constitutional: Negative for fever and unexpected weight change.  HENT: Positive for congestion. Negative for dental  problem, ear pain, nosebleeds, postnasal drip, rhinorrhea, sinus pressure, sneezing, sore throat and trouble swallowing.   Eyes: Negative for redness and itching.  Respiratory: Positive for cough. Negative for chest tightness, shortness of breath and wheezing.   Cardiovascular: Negative for palpitations and leg swelling.  Gastrointestinal: Negative for nausea and vomiting.  Genitourinary: Negative for dysuria.  Musculoskeletal: Negative for joint swelling.  Skin: Negative for rash.  Neurological: Negative for headaches.  Hematological: Does not bruise/bleed easily.  Psychiatric/Behavioral: Negative for dysphoric mood. The patient is not nervous/anxious.        Objective:   Physical Exam   Gen. Pleasant, well-nourished, in no distress ENT - no thrush, no post nasal drip, class 2 airway Neck: No JVD, no thyromegaly,  no carotid bruits Lungs: no use of accessory muscles, no dullness to percussion, clear without rales or rhonchi  Cardiovascular: Rhythm regular, heart sounds  normal, no murmurs or gallops, no peripheral edema Musculoskeletal: No deformities, no cyanosis or clubbing         Assessment & Plan:

## 2017-04-22 ENCOUNTER — Ambulatory Visit (HOSPITAL_BASED_OUTPATIENT_CLINIC_OR_DEPARTMENT_OTHER): Payer: Medicare Other | Attending: Pulmonary Disease | Admitting: Radiology

## 2017-04-22 DIAGNOSIS — G4733 Obstructive sleep apnea (adult) (pediatric): Secondary | ICD-10-CM

## 2017-04-23 ENCOUNTER — Other Ambulatory Visit (HOSPITAL_BASED_OUTPATIENT_CLINIC_OR_DEPARTMENT_OTHER): Payer: Medicare Other

## 2017-05-08 DIAGNOSIS — D485 Neoplasm of uncertain behavior of skin: Secondary | ICD-10-CM | POA: Diagnosis not present

## 2017-05-08 DIAGNOSIS — L988 Other specified disorders of the skin and subcutaneous tissue: Secondary | ICD-10-CM | POA: Diagnosis not present

## 2017-05-22 DIAGNOSIS — Z961 Presence of intraocular lens: Secondary | ICD-10-CM | POA: Diagnosis not present

## 2017-05-22 DIAGNOSIS — H26492 Other secondary cataract, left eye: Secondary | ICD-10-CM | POA: Diagnosis not present

## 2017-06-18 DIAGNOSIS — H26492 Other secondary cataract, left eye: Secondary | ICD-10-CM | POA: Diagnosis not present

## 2017-07-10 DIAGNOSIS — R201 Hypoesthesia of skin: Secondary | ICD-10-CM | POA: Diagnosis not present

## 2017-07-10 DIAGNOSIS — M5441 Lumbago with sciatica, right side: Secondary | ICD-10-CM | POA: Diagnosis not present

## 2017-07-10 DIAGNOSIS — R8271 Bacteriuria: Secondary | ICD-10-CM | POA: Diagnosis not present

## 2017-07-10 DIAGNOSIS — R3914 Feeling of incomplete bladder emptying: Secondary | ICD-10-CM | POA: Diagnosis not present

## 2017-07-10 DIAGNOSIS — R300 Dysuria: Secondary | ICD-10-CM | POA: Diagnosis not present

## 2017-07-10 DIAGNOSIS — M79604 Pain in right leg: Secondary | ICD-10-CM | POA: Diagnosis not present

## 2017-07-15 ENCOUNTER — Other Ambulatory Visit: Payer: Self-pay | Admitting: Specialist

## 2017-07-15 DIAGNOSIS — M5417 Radiculopathy, lumbosacral region: Secondary | ICD-10-CM

## 2017-08-07 ENCOUNTER — Ambulatory Visit
Admission: RE | Admit: 2017-08-07 | Discharge: 2017-08-07 | Disposition: A | Discharge status: Home / Self Care | Payer: Medicare Other | Source: Ambulatory Visit | Attending: Specialist | Admitting: Specialist

## 2017-08-07 DIAGNOSIS — M48061 Spinal stenosis, lumbar region without neurogenic claudication: Secondary | ICD-10-CM | POA: Diagnosis not present

## 2017-08-07 DIAGNOSIS — M5417 Radiculopathy, lumbosacral region: Secondary | ICD-10-CM

## 2017-08-13 DIAGNOSIS — M47816 Spondylosis without myelopathy or radiculopathy, lumbar region: Secondary | ICD-10-CM | POA: Diagnosis not present

## 2017-08-13 DIAGNOSIS — I10 Essential (primary) hypertension: Secondary | ICD-10-CM | POA: Diagnosis not present

## 2017-08-13 DIAGNOSIS — Z6825 Body mass index (BMI) 25.0-25.9, adult: Secondary | ICD-10-CM | POA: Diagnosis not present

## 2017-08-13 DIAGNOSIS — M48062 Spinal stenosis, lumbar region with neurogenic claudication: Secondary | ICD-10-CM | POA: Diagnosis not present

## 2017-08-13 DIAGNOSIS — M7138 Other bursal cyst, other site: Secondary | ICD-10-CM | POA: Diagnosis not present

## 2017-08-15 ENCOUNTER — Other Ambulatory Visit: Payer: Self-pay | Admitting: Neurological Surgery

## 2017-08-20 NOTE — Pre-Procedure Instructions (Signed)
Marquie Aderhold Teigen  08/20/2017      [id number], [place] [address] Phone: [phone] Fax: [facility] - [address], [place] [address] Phone: [phone] Fax: [phone]  [facility] [phone] [place], [place] - [place] [address] Phone: [phone] Fax: [place], [place] - [address] [place] [address] Phone: [phone] Fax: [phone]    Your procedure is scheduled on Mon. Oct. 15  Report to [facility] Admitting at 9:55 A.M.  Call this number if you have problems the morning of surgery:  [phone]   Remember:  Do not eat food or drink liquids after midnight  Sun. Oct. 14   Take these medicines the morning of surgery with A SIP OF WATER : tylenol if needed, amlodipine (norvasc), hydrocodone if needed, metoprolol succinate (toprol-xl),               7 days prior to surgery STOP taking any Aspirin (unless otherwise instructed by your surgeon), Aleve, Naproxen, Ibuprofen, Motrin, Advil, Goody's, BC's, all herbal medications, fish oil, and all vitamins     Do not wear jewelry.  Do not wear lotions, powders, or perfumes, or deoderant.  Do not shave 48 hours prior to surgery.  Men may shave face and neck.  Do not bring valuables to the hospital.  [facility] is not responsible for any belongings or valuables.  Contacts, dentures or bridgework may not be worn into surgery.  Leave your suitcase in the car.  After surgery it may be brought to your room.  For patients admitted to the hospital, discharge time will be determined by your treatment team.  Patients discharged the day of surgery will not be allowed to drive home.    Special  instructions:  [facility]- Preparing For Surgery  Before surgery, you can play an important role. Because skin is not sterile, your skin needs to be as free of germs as possible. You can reduce the number of germs on your skin by washing with CHG (chlorahexidine gluconate) Soap before surgery.  CHG is an antiseptic cleaner which kills germs and bonds with the skin to continue killing germs even after washing.  Please do not use if you have an allergy to CHG or antibacterial soaps. If your skin becomes reddened/irritated stop using the CHG.  Do not shave (including legs and underarms) for at least 48 hours prior to first CHG shower. It is OK to shave your face.  Please follow these instructions carefully.   1. Shower the NIGHT BEFORE SURGERY and the MORNING OF SURGERY with CHG.   2. If you chose to wash your hair, wash your hair first as usual with your normal shampoo.  3. After you shampoo, rinse your hair and body thoroughly to remove the shampoo.  4. Use CHG as you would any other liquid soap. You can apply CHG directly to the skin and wash gently with a scrungie or a clean washcloth.   5. Apply the CHG Soap to your body ONLY FROM THE NECK DOWN.  Do not use on open wounds or open sores. Avoid contact with your eyes, ears, mouth and genitals (private parts). Wash genitals (private parts) with your normal soap.  USE REGULAR SHAMPOO AND CONDITIONER FOR HAIR USE  REGULAR SOAP FOR FACE AND PRIVATE AREA  6. Wash thoroughly, paying special attention to the area where your surgery will be performed.  7. Thoroughly rinse your body with warm water from the neck down.  8. DO NOT shower/wash with your normal soap after using and rinsing off the CHG Soap.  9. Pat yourself dry with a CLEAN TOWEL and [place] CLOTH  10. Wear CLEAN PAJAMAS to bed the night before surgery, wear comfortable clothes the morning of surgery  11. Place CLEAN SHEETS on your bed the night of your first shower and DO NOT SLEEP  WITH PETS.    Day of Surgery: Do not apply any deodorants/lotions. Please wear clean clothes to the hospital/surgery center.      Please read over the following fact sheets that you were given. Coughing and Deep Breathing and Surgical Site Infection Prevention

## 2017-08-21 ENCOUNTER — Encounter (HOSPITAL_COMMUNITY): Payer: Self-pay

## 2017-08-21 ENCOUNTER — Encounter (HOSPITAL_COMMUNITY)
Admission: RE | Admit: 2017-08-21 | Discharge: 2017-08-21 | Disposition: A | Discharge status: Home / Self Care | Payer: Medicare Other | Source: Ambulatory Visit | Attending: Neurological Surgery | Admitting: Neurological Surgery

## 2017-08-21 ENCOUNTER — Other Ambulatory Visit: Payer: Self-pay

## 2017-08-21 DIAGNOSIS — Z01812 Encounter for preprocedural laboratory examination: Secondary | ICD-10-CM | POA: Diagnosis not present

## 2017-08-21 HISTORY — DX: Other specified health status: Z78.9

## 2017-08-21 HISTORY — DX: Benign prostatic hyperplasia without lower urinary tract symptoms: N40.0

## 2017-08-21 HISTORY — DX: Sleep apnea, unspecified: G47.30

## 2017-08-21 LAB — CBC
HEMATOCRIT: 46.3 % (ref 39.0–52.0)
Hemoglobin: 15.7 g/dL (ref 13.0–17.0)
MCH: 31.8 pg (ref 26.0–34.0)
MCHC: 33.9 g/dL (ref 30.0–36.0)
MCV: 93.7 fL (ref 78.0–100.0)
Platelets: 294 10*3/uL (ref 150–400)
RBC: 4.94 MIL/uL (ref 4.22–5.81)
RDW: 14 % (ref 11.5–15.5)
WBC: 10 10*3/uL (ref 4.0–10.5)

## 2017-08-21 LAB — BASIC METABOLIC PANEL
Anion gap: 10 (ref 5–15)
BUN: 17 mg/dL (ref 6–20)
CO2: 25 mmol/L (ref 22–32)
CREATININE: 1.22 mg/dL (ref 0.61–1.24)
Calcium: 9.3 mg/dL (ref 8.9–10.3)
Chloride: 103 mmol/L (ref 101–111)
GFR calc Af Amer: >60 mL/min (ref >60)
GFR, EST NON AFRICAN AMERICAN: 55 mL/min — AB (ref >60)
GLUCOSE: 113 mg/dL — AB (ref 65–99)
POTASSIUM: 4.7 mmol/L (ref 3.5–5.1)
Sodium: 138 mmol/L (ref 135–145)

## 2017-08-21 LAB — SURGICAL PCR SCREEN
MRSA, PCR: NEGATIVE
STAPHYLOCOCCUS AUREUS: NEGATIVE

## 2017-08-21 NOTE — Progress Notes (Addendum)
Notified Dr. Clarice Pole office on 08-20-17 need second sign for orders in epic  PCP: Dr. Teressa Lower Urologist: Dr. Alinda Money Pulmonary: Dr. Lourena Simmonds apnea  Pt. States he self caths every morning and evening

## 2017-08-22 DIAGNOSIS — Z23 Encounter for immunization: Secondary | ICD-10-CM | POA: Diagnosis not present

## 2017-08-22 MED ORDER — VANCOMYCIN HCL IN DEXTROSE 1-5 GM/200ML-% IV SOLN
1000.0000 mg | INTRAVENOUS | Status: AC
  Administered 2017-08-25: 1000 mg via INTRAVENOUS
  Filled 2017-08-22: qty 200

## 2017-08-25 ENCOUNTER — Observation Stay (HOSPITAL_COMMUNITY): Payer: Medicare Other

## 2017-08-25 ENCOUNTER — Inpatient Hospital Stay (HOSPITAL_COMMUNITY): Payer: Medicare Other | Admitting: Anesthesiology

## 2017-08-25 ENCOUNTER — Encounter (HOSPITAL_COMMUNITY): Payer: Self-pay | Admitting: Anesthesiology

## 2017-08-25 ENCOUNTER — Ambulatory Visit (HOSPITAL_COMMUNITY)
Admission: RE | Disposition: A | Discharge status: Home / Self Care | Payer: Self-pay | Source: Ambulatory Visit | Attending: Neurological Surgery

## 2017-08-25 ENCOUNTER — Observation Stay (HOSPITAL_COMMUNITY)
Admission: RE | Admit: 2017-08-25 | Discharge: 2017-08-26 | Disposition: A | Discharge status: Home / Self Care | Payer: Medicare Other | Source: Ambulatory Visit | Attending: Neurological Surgery | Admitting: Neurological Surgery

## 2017-08-25 DIAGNOSIS — Z87891 Personal history of nicotine dependence: Secondary | ICD-10-CM | POA: Diagnosis not present

## 2017-08-25 DIAGNOSIS — M5416 Radiculopathy, lumbar region: Secondary | ICD-10-CM | POA: Insufficient documentation

## 2017-08-25 DIAGNOSIS — N400 Enlarged prostate without lower urinary tract symptoms: Secondary | ICD-10-CM | POA: Insufficient documentation

## 2017-08-25 DIAGNOSIS — M4726 Other spondylosis with radiculopathy, lumbar region: Secondary | ICD-10-CM | POA: Diagnosis not present

## 2017-08-25 DIAGNOSIS — M48062 Spinal stenosis, lumbar region with neurogenic claudication: Secondary | ICD-10-CM | POA: Diagnosis not present

## 2017-08-25 DIAGNOSIS — M545 Low back pain: Secondary | ICD-10-CM | POA: Diagnosis not present

## 2017-08-25 DIAGNOSIS — E7800 Pure hypercholesterolemia, unspecified: Secondary | ICD-10-CM | POA: Insufficient documentation

## 2017-08-25 DIAGNOSIS — M4807 Spinal stenosis, lumbosacral region: Secondary | ICD-10-CM | POA: Diagnosis not present

## 2017-08-25 DIAGNOSIS — M7138 Other bursal cyst, other site: Secondary | ICD-10-CM | POA: Insufficient documentation

## 2017-08-25 DIAGNOSIS — M5417 Radiculopathy, lumbosacral region: Secondary | ICD-10-CM | POA: Diagnosis not present

## 2017-08-25 DIAGNOSIS — Z7982 Long term (current) use of aspirin: Secondary | ICD-10-CM | POA: Diagnosis not present

## 2017-08-25 DIAGNOSIS — I739 Peripheral vascular disease, unspecified: Secondary | ICD-10-CM | POA: Insufficient documentation

## 2017-08-25 DIAGNOSIS — M48061 Spinal stenosis, lumbar region without neurogenic claudication: Secondary | ICD-10-CM | POA: Diagnosis present

## 2017-08-25 DIAGNOSIS — G4730 Sleep apnea, unspecified: Secondary | ICD-10-CM | POA: Insufficient documentation

## 2017-08-25 DIAGNOSIS — Z419 Encounter for procedure for purposes other than remedying health state, unspecified: Secondary | ICD-10-CM

## 2017-08-25 DIAGNOSIS — I10 Essential (primary) hypertension: Secondary | ICD-10-CM | POA: Diagnosis not present

## 2017-08-25 DIAGNOSIS — Z79899 Other long term (current) drug therapy: Secondary | ICD-10-CM | POA: Diagnosis not present

## 2017-08-25 HISTORY — PX: LUMBAR LAMINECTOMY/DECOMPRESSION MICRODISCECTOMY: SHX5026

## 2017-08-25 SURGERY — LUMBAR LAMINECTOMY/DECOMPRESSION MICRODISCECTOMY 3 LEVELS
Anesthesia: General | Site: Back | Laterality: Bilateral

## 2017-08-25 MED ORDER — THROMBIN 20000 UNITS EX SOLR
CUTANEOUS | Status: DC | PRN
  Administered 2017-08-25: 13:00:00 via TOPICAL

## 2017-08-25 MED ORDER — LIDOCAINE 2% (20 MG/ML) 5 ML SYRINGE
INTRAMUSCULAR | Status: AC
  Filled 2017-08-25: qty 5

## 2017-08-25 MED ORDER — SIMVASTATIN 20 MG PO TABS
40.0000 mg | ORAL_TABLET | Freq: Every day | ORAL | Status: DC
  Administered 2017-08-25: 40 mg via ORAL
  Filled 2017-08-25 (×2): qty 2

## 2017-08-25 MED ORDER — HYDROMORPHONE HCL 1 MG/ML IJ SOLN: 0.5000 mg | INTRAMUSCULAR | Status: DC | PRN

## 2017-08-25 MED ORDER — ACETAMINOPHEN 325 MG PO TABS: 650.0000 mg | ORAL_TABLET | ORAL | Status: DC | PRN

## 2017-08-25 MED ORDER — PHENYLEPHRINE HCL 10 MG/ML IJ SOLN
INTRAMUSCULAR | Status: DC | PRN
  Administered 2017-08-25 (×3): 80 ug via INTRAVENOUS

## 2017-08-25 MED ORDER — LACTATED RINGERS IV SOLN
INTRAVENOUS | Status: DC | PRN
  Administered 2017-08-25 (×2): via INTRAVENOUS

## 2017-08-25 MED ORDER — DEXAMETHASONE SODIUM PHOSPHATE 10 MG/ML IJ SOLN
INTRAMUSCULAR | Status: AC
  Filled 2017-08-25: qty 2

## 2017-08-25 MED ORDER — HYDROCODONE-ACETAMINOPHEN 5-325 MG PO TABS: 1.0000 | ORAL_TABLET | Freq: Three times a day (TID) | ORAL | Status: DC | PRN

## 2017-08-25 MED ORDER — 0.9 % SODIUM CHLORIDE (POUR BTL) OPTIME
TOPICAL | Status: DC | PRN
  Administered 2017-08-25: 1000 mL

## 2017-08-25 MED ORDER — SENNA 8.6 MG PO TABS
1.0000 | ORAL_TABLET | Freq: Two times a day (BID) | ORAL | Status: DC
  Filled 2017-08-25: qty 1

## 2017-08-25 MED ORDER — LACTATED RINGERS IV SOLN: INTRAVENOUS | Status: DC

## 2017-08-25 MED ORDER — ONDANSETRON HCL 4 MG/2ML IJ SOLN
INTRAMUSCULAR | Status: DC | PRN
  Administered 2017-08-25: 4 mg via INTRAVENOUS

## 2017-08-25 MED ORDER — SUGAMMADEX SODIUM 200 MG/2ML IV SOLN
INTRAVENOUS | Status: AC
  Filled 2017-08-25: qty 2

## 2017-08-25 MED ORDER — METOPROLOL SUCCINATE ER 25 MG PO TB24
50.0000 mg | ORAL_TABLET | Freq: Every day | ORAL | Status: DC
  Administered 2017-08-25: 50 mg via ORAL
  Filled 2017-08-25 (×2): qty 2

## 2017-08-25 MED ORDER — ALUM & MAG HYDROXIDE-SIMETH 200-200-20 MG/5ML PO SUSP: 30.0000 mL | Freq: Four times a day (QID) | ORAL | Status: DC | PRN

## 2017-08-25 MED ORDER — PHENOL 1.4 % MT LIQD: 1.0000 | OROMUCOSAL | Status: DC | PRN

## 2017-08-25 MED ORDER — ARTIFICIAL TEARS OPHTHALMIC OINT
TOPICAL_OINTMENT | OPHTHALMIC | Status: DC | PRN
  Administered 2017-08-25: 1 via OPHTHALMIC

## 2017-08-25 MED ORDER — VANCOMYCIN HCL IN DEXTROSE 1-5 GM/200ML-% IV SOLN
1000.0000 mg | Freq: Once | INTRAVENOUS | Status: AC
  Administered 2017-08-25: 1000 mg via INTRAVENOUS
  Filled 2017-08-25: qty 200

## 2017-08-25 MED ORDER — ACETAMINOPHEN 650 MG RE SUPP: 650.0000 mg | RECTAL | Status: DC | PRN

## 2017-08-25 MED ORDER — LIDOCAINE-EPINEPHRINE 1 %-1:100000 IJ SOLN
INTRAMUSCULAR | Status: AC
  Filled 2017-08-25: qty 1

## 2017-08-25 MED ORDER — METHOCARBAMOL 500 MG PO TABS
500.0000 mg | ORAL_TABLET | Freq: Four times a day (QID) | ORAL | Status: DC | PRN
Start: 2017-08-25 — End: 2017-08-26
  Administered 2017-08-25 – 2017-08-26 (×2): 500 mg via ORAL
  Filled 2017-08-25 (×2): qty 1

## 2017-08-25 MED ORDER — ROCURONIUM BROMIDE 50 MG/5ML IV SOLN
INTRAVENOUS | Status: AC
  Filled 2017-08-25: qty 1

## 2017-08-25 MED ORDER — MEPERIDINE HCL 25 MG/ML IJ SOLN: 6.2500 mg | INTRAMUSCULAR | Status: DC | PRN

## 2017-08-25 MED ORDER — ONDANSETRON HCL 4 MG/2ML IJ SOLN
INTRAMUSCULAR | Status: AC
  Filled 2017-08-25: qty 2

## 2017-08-25 MED ORDER — DEXAMETHASONE SODIUM PHOSPHATE 10 MG/ML IJ SOLN
INTRAMUSCULAR | Status: DC | PRN
  Administered 2017-08-25: 10 mg via INTRAVENOUS

## 2017-08-25 MED ORDER — HYDROCODONE-ACETAMINOPHEN 5-325 MG PO TABS
1.0000 | ORAL_TABLET | ORAL | Status: DC | PRN
  Administered 2017-08-25 – 2017-08-26 (×3): 2 via ORAL
  Filled 2017-08-25 (×2): qty 2

## 2017-08-25 MED ORDER — FENTANYL CITRATE (PF) 100 MCG/2ML IJ SOLN
INTRAMUSCULAR | Status: DC | PRN
  Administered 2017-08-25: 100 ug via INTRAVENOUS
  Administered 2017-08-25 (×2): 50 ug via INTRAVENOUS

## 2017-08-25 MED ORDER — PROPOFOL 10 MG/ML IV BOLUS
INTRAVENOUS | Status: AC
  Filled 2017-08-25: qty 20

## 2017-08-25 MED ORDER — TAMSULOSIN HCL 0.4 MG PO CAPS
0.4000 mg | ORAL_CAPSULE | Freq: Every day | ORAL | Status: DC
  Administered 2017-08-25: 0.4 mg via ORAL
  Filled 2017-08-25 (×2): qty 1

## 2017-08-25 MED ORDER — PROPOFOL 10 MG/ML IV BOLUS
INTRAVENOUS | Status: DC | PRN
  Administered 2017-08-25: 30 mg via INTRAVENOUS
  Administered 2017-08-25: 150 mg via INTRAVENOUS
  Administered 2017-08-25: 50 mg via INTRAVENOUS

## 2017-08-25 MED ORDER — HYDROMORPHONE HCL 1 MG/ML IJ SOLN: 0.2500 mg | INTRAMUSCULAR | Status: DC | PRN

## 2017-08-25 MED ORDER — THROMBIN 5000 UNITS EX SOLR
OROMUCOSAL | Status: DC | PRN
  Administered 2017-08-25: 13:00:00 via TOPICAL

## 2017-08-25 MED ORDER — METHOCARBAMOL 1000 MG/10ML IJ SOLN
500.0000 mg | Freq: Four times a day (QID) | INTRAVENOUS | Status: DC | PRN
  Filled 2017-08-25: qty 5

## 2017-08-25 MED ORDER — DIPHENHYDRAMINE HCL 25 MG PO CAPS
25.0000 mg | ORAL_CAPSULE | Freq: Every evening | ORAL | Status: DC | PRN
  Administered 2017-08-25: 50 mg via ORAL
  Filled 2017-08-25: qty 2

## 2017-08-25 MED ORDER — POLYETHYLENE GLYCOL 3350 17 G PO PACK: 17.0000 g | PACK | Freq: Every day | ORAL | Status: DC | PRN

## 2017-08-25 MED ORDER — SODIUM CHLORIDE 0.9 % IR SOLN
Status: DC | PRN
  Administered 2017-08-25: 13:00:00

## 2017-08-25 MED ORDER — ROCURONIUM BROMIDE 100 MG/10ML IV SOLN
INTRAVENOUS | Status: DC | PRN
  Administered 2017-08-25: 50 mg via INTRAVENOUS

## 2017-08-25 MED ORDER — DOCUSATE SODIUM 100 MG PO CAPS
100.0000 mg | ORAL_CAPSULE | Freq: Two times a day (BID) | ORAL | Status: DC
  Filled 2017-08-25: qty 1

## 2017-08-25 MED ORDER — PROMETHAZINE HCL 25 MG/ML IJ SOLN: 6.2500 mg | INTRAMUSCULAR | Status: DC | PRN

## 2017-08-25 MED ORDER — SODIUM CHLORIDE 0.9% FLUSH
3.0000 mL | Freq: Two times a day (BID) | INTRAVENOUS | Status: DC
  Administered 2017-08-25: 3 mL via INTRAVENOUS

## 2017-08-25 MED ORDER — ONDANSETRON HCL 4 MG/2ML IJ SOLN: 4.0000 mg | Freq: Four times a day (QID) | INTRAMUSCULAR | Status: DC | PRN

## 2017-08-25 MED ORDER — THROMBIN 20000 UNITS EX KIT
PACK | CUTANEOUS | Status: AC
  Filled 2017-08-25: qty 1

## 2017-08-25 MED ORDER — EPHEDRINE 5 MG/ML INJ
INTRAVENOUS | Status: AC
  Filled 2017-08-25: qty 10

## 2017-08-25 MED ORDER — CHLORHEXIDINE GLUCONATE CLOTH 2 % EX PADS: 6.0000 | MEDICATED_PAD | Freq: Once | CUTANEOUS | Status: DC

## 2017-08-25 MED ORDER — SODIUM CHLORIDE 0.9% FLUSH: 3.0000 mL | INTRAVENOUS | Status: DC | PRN

## 2017-08-25 MED ORDER — DEXAMETHASONE SODIUM PHOSPHATE 10 MG/ML IJ SOLN
INTRAMUSCULAR | Status: AC
  Filled 2017-08-25: qty 1

## 2017-08-25 MED ORDER — BISACODYL 10 MG RE SUPP: 10.0000 mg | Freq: Every day | RECTAL | Status: DC | PRN

## 2017-08-25 MED ORDER — FENTANYL CITRATE (PF) 250 MCG/5ML IJ SOLN
INTRAMUSCULAR | Status: AC
  Filled 2017-08-25: qty 5

## 2017-08-25 MED ORDER — ONDANSETRON HCL 4 MG PO TABS: 4.0000 mg | ORAL_TABLET | Freq: Four times a day (QID) | ORAL | Status: DC | PRN

## 2017-08-25 MED ORDER — CEFAZOLIN SODIUM-DEXTROSE 2-4 GM/100ML-% IV SOLN: 2.0000 g | Freq: Three times a day (TID) | INTRAVENOUS | Status: DC

## 2017-08-25 MED ORDER — BUPIVACAINE HCL (PF) 0.5 % IJ SOLN
INTRAMUSCULAR | Status: AC
  Filled 2017-08-25: qty 30

## 2017-08-25 MED ORDER — PHENYLEPHRINE HCL 10 MG/ML IJ SOLN
INTRAVENOUS | Status: DC | PRN
  Administered 2017-08-25: 40 ug/min via INTRAVENOUS

## 2017-08-25 MED ORDER — EPHEDRINE SULFATE 50 MG/ML IJ SOLN
INTRAMUSCULAR | Status: DC | PRN
  Administered 2017-08-25: 15 mg via INTRAVENOUS
  Administered 2017-08-25 (×2): 10 mg via INTRAVENOUS

## 2017-08-25 MED ORDER — MENTHOL 3 MG MT LOZG: 1.0000 | LOZENGE | OROMUCOSAL | Status: DC | PRN

## 2017-08-25 MED ORDER — LOSARTAN POTASSIUM 50 MG PO TABS
100.0000 mg | ORAL_TABLET | Freq: Every day | ORAL | Status: DC
  Filled 2017-08-25: qty 2

## 2017-08-25 MED ORDER — HYDROCODONE-ACETAMINOPHEN 5-325 MG PO TABS
ORAL_TABLET | ORAL | Status: AC
  Filled 2017-08-25: qty 2

## 2017-08-25 MED ORDER — LACTATED RINGERS IV SOLN
INTRAVENOUS | Status: DC
  Administered 2017-08-25: 11:00:00 via INTRAVENOUS

## 2017-08-25 MED ORDER — AMLODIPINE BESYLATE 10 MG PO TABS
10.0000 mg | ORAL_TABLET | Freq: Every day | ORAL | Status: DC
Start: 2017-08-25 — End: 2017-08-26
  Administered 2017-08-25: 10 mg via ORAL
  Filled 2017-08-25 (×2): qty 1

## 2017-08-25 MED ORDER — LIDOCAINE-EPINEPHRINE 1 %-1:100000 IJ SOLN
INTRAMUSCULAR | Status: DC | PRN
  Administered 2017-08-25: 5 mL

## 2017-08-25 MED ORDER — LIDOCAINE HCL (CARDIAC) 20 MG/ML IV SOLN
INTRAVENOUS | Status: DC | PRN
  Administered 2017-08-25: 80 mg via INTRAVENOUS

## 2017-08-25 MED ORDER — BUPIVACAINE HCL (PF) 0.5 % IJ SOLN
INTRAMUSCULAR | Status: DC | PRN
  Administered 2017-08-25: 5 mL
  Administered 2017-08-25: 25 mL

## 2017-08-25 MED ORDER — FLEET ENEMA 7-19 GM/118ML RE ENEM: 1.0000 | ENEMA | Freq: Once | RECTAL | Status: DC | PRN

## 2017-08-25 SURGICAL SUPPLY — 58 items
BAG DECANTER FOR FLEXI CONT (MISCELLANEOUS) ×3 IMPLANT
BLADE CLIPPER SURG (BLADE) IMPLANT
BUR ACORN 6.0 (BURR) IMPLANT
BUR ACORN 6.0MM (BURR)
BUR MATCHSTICK NEURO 3.0 LAGG (BURR) ×3 IMPLANT
CANISTER SUCT 3000ML PPV (MISCELLANEOUS) ×3 IMPLANT
CARTRIDGE OIL MAESTRO DRILL (MISCELLANEOUS) IMPLANT
DECANTER SPIKE VIAL GLASS SM (MISCELLANEOUS) ×3 IMPLANT
DERMABOND ADVANCED (GAUZE/BANDAGES/DRESSINGS) ×2
DERMABOND ADVANCED .7 DNX12 (GAUZE/BANDAGES/DRESSINGS) ×1 IMPLANT
DEVICE DISSECT PLASMABLAD 3.0S (MISCELLANEOUS) ×1 IMPLANT
DIFFUSER DRILL AIR PNEUMATIC (MISCELLANEOUS) IMPLANT
DRAPE HALF SHEET 40X57 (DRAPES) IMPLANT
DRAPE LAPAROTOMY 100X72X124 (DRAPES) ×3 IMPLANT
DRAPE MICROSCOPE LEICA (MISCELLANEOUS) ×3 IMPLANT
DRAPE POUCH INSTRU U-SHP 10X18 (DRAPES) ×3 IMPLANT
DRSG OPSITE POSTOP 4X6 (GAUZE/BANDAGES/DRESSINGS) ×3 IMPLANT
DURAPREP 26ML APPLICATOR (WOUND CARE) ×3 IMPLANT
ELECT REM PT RETURN 9FT ADLT (ELECTROSURGICAL) ×3
ELECTRODE REM PT RTRN 9FT ADLT (ELECTROSURGICAL) ×1 IMPLANT
GAUZE SPONGE 4X4 12PLY STRL (GAUZE/BANDAGES/DRESSINGS) ×3 IMPLANT
GAUZE SPONGE 4X4 16PLY XRAY LF (GAUZE/BANDAGES/DRESSINGS) ×3 IMPLANT
GLOVE BIO SURGEON STRL SZ 6.5 (GLOVE) ×4 IMPLANT
GLOVE BIO SURGEONS STRL SZ 6.5 (GLOVE) ×2
GLOVE BIOGEL PI IND STRL 6.5 (GLOVE) ×2 IMPLANT
GLOVE BIOGEL PI IND STRL 7.5 (GLOVE) ×2 IMPLANT
GLOVE BIOGEL PI IND STRL 8.5 (GLOVE) ×1 IMPLANT
GLOVE BIOGEL PI INDICATOR 6.5 (GLOVE) ×4
GLOVE BIOGEL PI INDICATOR 7.5 (GLOVE) ×4
GLOVE BIOGEL PI INDICATOR 8.5 (GLOVE) ×2
GLOVE ECLIPSE 8.5 STRL (GLOVE) ×3 IMPLANT
GLOVE SURG SS PI 7.5 STRL IVOR (GLOVE) ×6 IMPLANT
GOWN STRL REUS W/ TWL LRG LVL3 (GOWN DISPOSABLE) ×3 IMPLANT
GOWN STRL REUS W/ TWL XL LVL3 (GOWN DISPOSABLE) IMPLANT
GOWN STRL REUS W/TWL 2XL LVL3 (GOWN DISPOSABLE) ×3 IMPLANT
GOWN STRL REUS W/TWL LRG LVL3 (GOWN DISPOSABLE) ×6
GOWN STRL REUS W/TWL XL LVL3 (GOWN DISPOSABLE)
HEMOSTAT POWDER KIT SURGIFOAM (HEMOSTASIS) ×3 IMPLANT
KIT BASIN OR (CUSTOM PROCEDURE TRAY) ×3 IMPLANT
KIT ROOM TURNOVER OR (KITS) ×3 IMPLANT
NEEDLE HYPO 22GX1.5 SAFETY (NEEDLE) ×3 IMPLANT
NEEDLE SPNL 20GX3.5 QUINCKE YW (NEEDLE) ×3 IMPLANT
NS IRRIG 1000ML POUR BTL (IV SOLUTION) ×3 IMPLANT
OIL CARTRIDGE MAESTRO DRILL (MISCELLANEOUS)
PACK LAMINECTOMY NEURO (CUSTOM PROCEDURE TRAY) ×3 IMPLANT
PAD ARMBOARD 7.5X6 YLW CONV (MISCELLANEOUS) ×9 IMPLANT
PATTIES SURGICAL .5 X1 (DISPOSABLE) ×3 IMPLANT
PLASMABLADE 3.0S (MISCELLANEOUS) ×3
RUBBERBAND STERILE (MISCELLANEOUS) ×6 IMPLANT
SPONGE SURGIFOAM ABS GEL 100 (HEMOSTASIS) ×3 IMPLANT
SPONGE SURGIFOAM ABS GEL SZ50 (HEMOSTASIS) IMPLANT
SUT VIC AB 1 CT1 18XBRD ANBCTR (SUTURE) ×1 IMPLANT
SUT VIC AB 1 CT1 8-18 (SUTURE) ×2
SUT VIC AB 2-0 CP2 18 (SUTURE) ×6 IMPLANT
SUT VIC AB 3-0 SH 8-18 (SUTURE) ×6 IMPLANT
TOWEL GREEN STERILE (TOWEL DISPOSABLE) ×3 IMPLANT
TOWEL GREEN STERILE FF (TOWEL DISPOSABLE) ×3 IMPLANT
WATER STERILE IRR 1000ML POUR (IV SOLUTION) ×3 IMPLANT

## 2017-08-25 NOTE — Transfer of Care (Signed)
Immediate Anesthesia Transfer of Care Note  Patient: Jeremy [name]  Procedure(s) Performed: Bilateral Lumbar Three- Four, Lumbar Four- Five, Lumbar Five- Sacral One Laminectomy (Bilateral Back)  Patient Location: PACU  Anesthesia Type:General  Level of Consciousness: sedated  Airway & Oxygen Therapy: Patient Spontanous Breathing and Patient connected to face mask oxygen  Post-op Assessment: Report given to RN, Post -op Vital signs reviewed and stable and Patient moving all extremities X 4  Post vital signs: Reviewed and stable  Last Vitals:  Vitals:   08/25/17 0915 08/25/17 0951  BP: 130/86 (!) 152/63  Pulse: 65 84  Resp: 20 20  Temp: 36.8 C 36.8 C  SpO2: 100% 96%    Last Pain:  Vitals:   08/25/17 1026  TempSrc:   PainSc: 3          Complications: No apparent anesthesia complications

## 2017-08-25 NOTE — Anesthesia Postprocedure Evaluation (Signed)
Anesthesia Post Note  Patient: Jeremy [name]  Procedure(s) Performed: Bilateral Lumbar Three- Four, Lumbar Four- Five, Lumbar Five- Sacral One Laminectomy (Bilateral Back)     Patient location during evaluation: PACU Anesthesia Type: General Level of consciousness: sedated Pain management: pain level controlled Vital Signs Assessment: post-procedure vital signs reviewed and stable Respiratory status: spontaneous breathing and respiratory function stable Cardiovascular status: stable Postop Assessment: no apparent nausea or vomiting Anesthetic complications: no    Last Vitals:  Vitals:   08/25/17 1515 08/25/17 1532  BP: (!) 107/54   Pulse: 78 69  Resp: 16 16  Temp:    SpO2: 91% 92%                  Taurean Ju DANIEL

## 2017-08-25 NOTE — Anesthesia Preprocedure Evaluation (Addendum)
Anesthesia Evaluation  Patient identified by MRN, date of birth, ID band Patient awake    Reviewed: Allergy & Precautions, NPO status , Patient's Chart, lab work & pertinent test results, reviewed documented beta blocker date and time   Airway Mallampati: II  TM Distance: >3 FB Neck ROM: Full    Dental no notable dental hx. (+) Teeth Intact, Caps,  Multiple dental implants:   Pulmonary sleep apnea , former smoker,    Pulmonary exam normal breath sounds clear to auscultation       Cardiovascular hypertension, Pt. on medications and Pt. on home beta blockers + Peripheral Vascular Disease  Normal cardiovascular exam Rhythm:Regular Rate:Normal     Neuro/Psych negative neurological ROS  negative psych ROS   GI/Hepatic negative GI ROS, Neg liver ROS,   Endo/Other  Hypercholesterolemia  Renal/GU negative Renal ROS   BPH- self catheterizes am and pm    Musculoskeletal  (+) Arthritis , Osteoarthritis,  Lumbar spinal stenosis with neurogenic claudication   Abdominal   Peds  Hematology negative hematology ROS (+)   Anesthesia Other Findings   Reproductive/Obstetrics                            Anesthesia Physical Anesthesia Plan  ASA: III  Anesthesia Plan: General   Post-op Pain Management:    Induction: Intravenous  PONV Risk Score and Plan: 3 and Ondansetron, Dexamethasone, Midazolam and Propofol infusion  Airway Management Planned: Oral ETT  Additional Equipment:   Intra-op Plan:   Post-operative Plan: Extubation in OR  Informed Consent: I have reviewed the patients History and Physical, chart, labs and discussed the procedure including the risks, benefits and alternatives for the proposed anesthesia with the patient or authorized representative who has indicated his/her understanding and acceptance.   Dental advisory given  Plan Discussed with: CRNA, Anesthesiologist and  Surgeon  Anesthesia Plan Comments:         Anesthesia Quick Evaluation

## 2017-08-25 NOTE — Anesthesia Procedure Notes (Signed)
Procedure Name: Intubation Date/Time: 08/25/2017 11:49 AM Performed by: Neldon Newport Pre-anesthesia Checklist: Timeout performed, Patient being monitored, Emergency Drugs available, Suction available and Patient identified Patient Re-evaluated:Patient Re-evaluated prior to induction Oxygen Delivery Method: Circle system utilized Preoxygenation: Pre-oxygenation with 100% oxygen Induction Type: IV induction Ventilation: Mask ventilation without difficulty Laryngoscope Size: Mac and 3 Grade View: Grade II Tube type: Oral Tube size: 7.5 mm Number of attempts: 1 Placement Confirmation: breath sounds checked- equal and bilateral,  positive ETCO2 and ETT inserted through vocal cords under direct vision Secured at: 23 cm Tube secured with: Tape Dental Injury: Teeth and Oropharynx as per pre-operative assessment

## 2017-08-25 NOTE — Op Note (Signed)
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 Postoperative diagnosis: Same Procedure: I lateral laminotomies and foraminotomies for decompression of the L3-L4  L5 and S1 nerve roots  at L3-4 L4-5 L5-S1 Surgeon: Kristeen Miss First assistant: Newman Pies M.D. Anesthesia: Gen. Endotracheal Indications: Jeremy [name] is a 76 year old individual is had significant back and bilateral lower extremity pain with weakness in his lower extremities. 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 He is advised regarding the need for surgical decompression of each of these joints and is now taken to the operating room this procedure.  Procedure: The patient was brought to the operating room supine on a stretcher. After the smooth induction of general endotracheal anesthesia and placement of a Foley catheter use turned prone. The back was prepped with alcohol DuraPrep and draped in a sterile fashion. Midline incision was creating carried down to the lumbar dorsal fascia which was opened on either side of midline to expose the interlaminar space of L3-L4 and L5 with the spinous processes of L3 and L4 being identified positively on the radiograph. Then by starting at L3-4 the yellow ligament was cleared and the laminotomy was created removing the inferior marginal lamina of L3 out to and including partial facetectomy at L3-L4. The inner layers of the L ligament were taken up and these were then removed exposing the common dural tube. Superiorly the path for the L3 nerve root into the exit foramen was cleared using a 2 and 3 mm curved Kerrison punch. Using carried out bilaterally. Next the same procedure was repeated at L45 and here the stenosis was indeed noted to be much more severe. In the in the L IV nerve root  superiorly and the L5 nerve roots inferiorly were cleared. Then at L5-S1 laminotomies were created and on the right side there was encountered a significant synovial cyst inferiorly along the path of the S1 nerve root. This allowed for good decompression of the right S1 nerve root. Then on the left side there was noted be a substantial synovial cyst that was multiloculated and extended superiorly decompress the L5 nerve root and inferiorly along the common dural tube and the take off the S1 nerve root this was resected carefully as it was densely adherent to the dura. No CSF leaks were observed once the decompression was completed hemostasis and the epidural spaces was achieved to make sure that the pads of the L3 nerve root the L4 the L5 and S1 nerve roots were well decompressed bilaterally once hemostasis was established the retractor was removed the lumbar dorsal fascia was closed with #1 Vicryl interrupted fashion 2-0 Vicryl was used in subcutaneous anus tissues and 3-0 Vicryl subcuticularly. Dermabond was placed on the skin.

## 2017-08-25 NOTE — H&P (Signed)
CHIEF COMPLAINT: Back and bilateral lower extremity pain.  HISTORY OF PRESENT ILLNESS: [name] is a 76 year old right-handed individual who tells me that he has been experiencing pain into the lower extremities. It has been getting progressively worse, now, for at least 6 months, now, and perhaps even a little longer. He notes that he will get some pain that radiates from the buttocks into the posterolateral aspects of the thighs and down into the feet, seems to be worse on the right side, but then, it has also been occurring on the left side, and now, he feels that the left may actually be somewhat worse than the right. He notes that he does not have much in the way of back pain at all, and he finds that he can sleep and rest reasonably comfortably. However, the biggest problem is when he tries to stand, walk, or particularly, when he tries to play golf for any length of time, he gets this ache that causes him to have to stop. He has been seen and treated by Dr. Boyd Kerbs, and he had a number of epidural steroid injections. He notes that, initially, the injections seemed to help quite nicely, but gradually, they have lost their effectiveness, and now, he notes that the pain continues irregardless of what his level of activity is. He tells me that an MRI was recently performed, and today in the office, we had the opportunity to review this study performed just a few months ago, which demonstrates that there is evidence of significant stenosis at L3-4, 4-5, and 5-1. At L3-4, he has central canal stenosis that is moderately severe. At L4-5, he has mild central stenosis, but he has significant lateral recess stenosis, and at L5-S1, he has some formation of synovial cysts in both lateral recesses for the S1 nerve roots, which causes some compromise on the S1 nerve roots themselves.  PAST MEDICAL HISTORY: Reveals that his general health has been good. He reports no significant medical problems.  CURRENT  MEDICATIONS: Include Amlodipine, Baby Aspirin, Vitamin supplements, Vitamin D supplements, Losartan, Metoprolol, and a Multivitamin capsule. He notes that he was recently given some Hydrocodone for the pain and discomfort, but he prefers not to use this if at all possible.  SYSTEMS REVIEW: Notable for some hearing loss, balance disturbance, leg pain while walking, urinary tract infections, difficulty with starting and stopping urinary flow, bronchitis, leg weakness, leg pain, and difficulty with coordination in his arms and his legs.  PHYSICAL EXAMINATION: I note that he stands straight and erect. He walks without any obvious antalgia. His motor function appears good in the major groups including the iliopsoas, the quads, the tibialis anterior, and the gastrocs. He does have some diminished reflexes in the patellae and the Achilles both. Straight-leg raising is positive at 30 degrees in either lower extremity.  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 Today in the office, to further his workup, I obtained a lateral flexion-extension film plus a singular AP view. These radiographs demonstrate that he has no evidence of a listhesis at any of the levels involved, and he has good alignment in both the coronal and sagittal planes. I have advised Mr. Cafarelli that, at this point, it may be an appropriate consideration to do bilateral laminotomies and foraminotomies at L3-4, 4-5, and 5-1; at L3-4 to relieve, mostly, the central canal stenosis; at L4-5 to relieve the lateral recess stenosis; and at L5-S1 to decompress both the synovial cysts that  he has on the left and the right. I believe that this should go a long way to relieve the radicular symptoms that he has in his lower extremities, and he is eager to get something definitive done to help with this process. We will schedule surgery at the earliest convenience.

## 2017-08-26 ENCOUNTER — Encounter (HOSPITAL_COMMUNITY): Payer: Self-pay | Admitting: Neurological Surgery

## 2017-08-26 DIAGNOSIS — M48062 Spinal stenosis, lumbar region with neurogenic claudication: Secondary | ICD-10-CM | POA: Diagnosis not present

## 2017-08-26 DIAGNOSIS — M7138 Other bursal cyst, other site: Secondary | ICD-10-CM | POA: Diagnosis not present

## 2017-08-26 DIAGNOSIS — M5416 Radiculopathy, lumbar region: Secondary | ICD-10-CM | POA: Diagnosis not present

## 2017-08-26 DIAGNOSIS — Z7982 Long term (current) use of aspirin: Secondary | ICD-10-CM | POA: Diagnosis not present

## 2017-08-26 DIAGNOSIS — G4730 Sleep apnea, unspecified: Secondary | ICD-10-CM | POA: Diagnosis not present

## 2017-08-26 DIAGNOSIS — Z79899 Other long term (current) drug therapy: Secondary | ICD-10-CM | POA: Diagnosis not present

## 2017-08-26 MED ORDER — HYDROCODONE-ACETAMINOPHEN 5-325 MG PO TABS: 1.0000 | ORAL_TABLET | ORAL | 0 refills | Status: DC | PRN

## 2017-08-26 MED ORDER — METHOCARBAMOL 500 MG PO TABS: 500.0000 mg | ORAL_TABLET | Freq: Four times a day (QID) | ORAL | 0 refills | Status: DC | PRN

## 2017-08-26 MED FILL — Thrombin For Soln Kit 20000 Unit: CUTANEOUS | Qty: 1 | Status: AC

## 2017-08-26 NOTE — Evaluation (Signed)
Physical Therapy Evaluation Patient Details Name: Jeremy [name] MRN: [id number] DOB: 1941-01-15 Today's Date: 08/26/2017   History of Present Illness  Pt is a 76 y/o male who presents s/p L3-S1 laminectomy/microdiscectomy on 08/25/17.   Clinical Impression  Patient evaluated by Physical Therapy with no further acute PT needs identified. All education has been completed and the patient has no further questions. At the time of PT eval pt was dressed and ready for d/c - he was waiting for wife to pick him up. Pt was educated on precautions, stair training, car transfer, and general safety with walking program/activity progression. See below for any follow-up Physical Therapy or equipment needs. PT is signing off. Thank you for this referral.     Follow Up Recommendations No PT follow up;Supervision for mobility/OOB    Equipment Recommendations  None recommended by PT    Recommendations for Other Services       Precautions / Restrictions Precautions Precautions: Fall;Back Precaution Booklet Issued: Yes (comment) Precaution Comments: Reviewed handout and pt was cued for precautions during functional mobility.  Restrictions Weight Bearing Restrictions: No      Mobility  Bed Mobility               General bed mobility comments: Pt received sitting up in chair.   Transfers Overall transfer level: Modified independent Equipment used: None Transfers: Sit to/from Stand Sit to Stand: Modified independent (Device/Increase time)         General transfer comment: Pt able to power-up to full stand without assist. No unsteadiness or LOB noted.   Ambulation/Gait Ambulation/Gait assistance: Supervision Ambulation Distance (Feet): 400 Feet Assistive device: None Gait Pattern/deviations: Step-through pattern;Decreased stride length;Drifts right/left Gait velocity: Decreased Gait velocity interpretation: Below normal speed for age/gender General Gait Details: Pt frequently  drifting throughout gait training to both the R and L. Pt mainly demonstrating this during walking and talking activity. No assist required throughout, however supervision provided for safety.   Stairs Stairs: Yes Stairs assistance: Min guard;Supervision Stair Management: One rail Right;Step to pattern;Forwards;Alternating pattern Number of Stairs: 6 (x2) General stair comments: VC's for sequencing and general safety throughout. Pt was able to demonstrate both step-to pattern and alternating step pattern.   Wheelchair Mobility    Modified Rankin (Stroke Patients Only)       Balance Overall balance assessment: Needs assistance Sitting-balance support: Feet supported;No upper extremity supported Sitting balance-Leahy Scale: Fair     Standing balance support: No upper extremity supported;During functional activity Standing balance-Leahy Scale: Fair                               Pertinent Vitals/Pain Pain Assessment: Faces Faces Pain Scale: Hurts little more Pain Descriptors / Indicators: Operative site guarding;Grimacing Pain Intervention(s): Limited activity within patient's tolerance;Monitored during session;Repositioned    Home Living Family/patient expects to be discharged to:: Private residence   Available Help at Discharge: Family;Available 24 hours/day Type of Home: House Home Access: Stairs to enter Entrance Stairs-Rails: Psychiatric nurse of Steps: 6 Home Layout: Two level;Able to live on main level with bedroom/bathroom Home Equipment: None      Prior Function Level of Independence: Independent               Hand Dominance        Extremity/Trunk Assessment   Upper Extremity Assessment Upper Extremity Assessment: Defer to OT evaluation    Lower Extremity Assessment Lower Extremity Assessment: Overall [facility]  for tasks assessed    Cervical / Trunk Assessment Cervical / Trunk Assessment: Other exceptions Cervical /  Trunk Exceptions: s/p surgery  Communication   Communication: No difficulties  Cognition Arousal/Alertness: Awake/alert Behavior During Therapy: WFL for tasks assessed/performed Overall Cognitive Status: Within Functional Limits for tasks assessed                                        General Comments      Exercises     Assessment/Plan    PT Assessment Patent does not need any further PT services  PT Problem List         PT Treatment Interventions      PT Goals (Current goals can be found in the Care Plan section)  Acute Rehab PT Goals Patient Stated Goal: Home this morning PT Goal Formulation: All assessment and education complete, DC therapy    Frequency     Barriers to discharge        Co-evaluation               AM-PAC PT "6 Clicks" Daily Activity  Outcome Measure Difficulty turning over in bed (including adjusting bedclothes, sheets and blankets)?: None Difficulty moving from lying on back to sitting on the side of the bed? : None Difficulty sitting down on and standing up from a chair with arms (e.g., wheelchair, bedside commode, etc,.)?: None Help needed moving to and from a bed to chair (including a wheelchair)?: None Help needed walking in hospital room?: None Help needed climbing 3-5 steps with a railing? : A Little 6 Click Score: 23    End of Session Equipment Utilized During Treatment: Gait belt Activity Tolerance: Patient tolerated treatment well Patient left: in chair;with call bell/phone within reach;with family/visitor present Nurse Communication: Mobility status PT Visit Diagnosis: Other symptoms and signs involving the nervous system (R29.898);Pain Pain - part of body:  (Incision site)    Time: 5409-8119 PT Time Calculation (min) (ACUTE ONLY): 11 min   Charges:   PT Evaluation $PT Eval Moderate Complexity: 1 Mod     PT G Codes:   PT G-Codes **NOT FOR INPATIENT CLASS** Functional Assessment Tool Used: Clinical  judgement Functional Limitation: Mobility: Walking and moving around Mobility: Walking and Moving Around Current Status (J4782): At least 1 percent but less than 20 percent impaired, limited or restricted Mobility: Walking and Moving Around Goal Status [phone]): At least 1 percent but less than 20 percent impaired, limited or restricted Mobility: Walking and Moving Around Discharge Status [phone]): At least 1 percent but less than 20 percent impaired, limited or restricted    Rolinda Roan, PT, DPT Acute Rehabilitation Services Pager: [place] 08/26/2017, 9:40 AM

## 2017-08-26 NOTE — Progress Notes (Signed)
Vital signs are stable Motor function appears good Patient is comfortable For discharge this morning

## 2017-08-26 NOTE — Progress Notes (Signed)
Pt doing well. Pt given D/C instructions with Rx's, verbal understanding was provided. Pt's incision is clean and dry with no sign of infection. Pt's IV was removed prior to D/C. Pt D/C'd home via walking @ 0820 per MD order. Pt is stable @ D/C and has no other needs at this time. Holli Humbles, RN

## 2017-08-26 NOTE — Discharge Summary (Signed)
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 Condition on discharge: Improved Hospital course: Patient was admitted to undergo surgery decompresses nerve roots at L3-4 4551. He tolerated surgery well. Discharge medications: Hydrocodone 5/325 #30 Methocarbamol 500 mg #40

## 2017-10-01 DIAGNOSIS — N3001 Acute cystitis with hematuria: Secondary | ICD-10-CM | POA: Diagnosis not present

## 2017-10-30 DIAGNOSIS — M48062 Spinal stenosis, lumbar region with neurogenic claudication: Secondary | ICD-10-CM | POA: Diagnosis not present

## 2017-11-03 ENCOUNTER — Other Ambulatory Visit: Payer: Self-pay | Admitting: Pulmonary Disease

## 2017-11-05 ENCOUNTER — Other Ambulatory Visit: Payer: Self-pay | Admitting: *Deleted

## 2017-11-05 MED ORDER — METOPROLOL SUCCINATE ER 50 MG PO TB24: ORAL_TABLET | ORAL | 2 refills | Status: DC

## 2017-11-05 MED ORDER — SIMVASTATIN 40 MG PO TABS: 40.0000 mg | ORAL_TABLET | Freq: Every day | ORAL | 2 refills | Status: DC

## 2017-11-14 DIAGNOSIS — R3914 Feeling of incomplete bladder emptying: Secondary | ICD-10-CM | POA: Diagnosis not present

## 2017-11-14 DIAGNOSIS — C678 Malignant neoplasm of overlapping sites of bladder: Secondary | ICD-10-CM | POA: Diagnosis not present

## 2017-11-14 DIAGNOSIS — R8271 Bacteriuria: Secondary | ICD-10-CM | POA: Diagnosis not present

## 2017-11-14 DIAGNOSIS — N401 Enlarged prostate with lower urinary tract symptoms: Secondary | ICD-10-CM | POA: Diagnosis not present

## 2017-11-27 DIAGNOSIS — H5789 Other specified disorders of eye and adnexa: Secondary | ICD-10-CM | POA: Diagnosis not present

## 2017-11-27 DIAGNOSIS — H353121 Nonexudative age-related macular degeneration, left eye, early dry stage: Secondary | ICD-10-CM | POA: Diagnosis not present

## 2017-11-27 DIAGNOSIS — Z961 Presence of intraocular lens: Secondary | ICD-10-CM | POA: Diagnosis not present

## 2017-12-01 DIAGNOSIS — H353132 Nonexudative age-related macular degeneration, bilateral, intermediate dry stage: Secondary | ICD-10-CM | POA: Diagnosis not present

## 2017-12-01 DIAGNOSIS — Z961 Presence of intraocular lens: Secondary | ICD-10-CM | POA: Diagnosis not present

## 2017-12-01 DIAGNOSIS — H35362 Drusen (degenerative) of macula, left eye: Secondary | ICD-10-CM | POA: Diagnosis not present

## 2018-01-13 DIAGNOSIS — R3914 Feeling of incomplete bladder emptying: Secondary | ICD-10-CM | POA: Diagnosis not present

## 2018-02-10 ENCOUNTER — Other Ambulatory Visit: Payer: Self-pay | Admitting: Pulmonary Disease

## 2018-03-02 ENCOUNTER — Emergency Department (HOSPITAL_BASED_OUTPATIENT_CLINIC_OR_DEPARTMENT_OTHER): Payer: Medicare Other

## 2018-03-02 ENCOUNTER — Other Ambulatory Visit: Payer: Self-pay

## 2018-03-02 ENCOUNTER — Encounter (HOSPITAL_BASED_OUTPATIENT_CLINIC_OR_DEPARTMENT_OTHER): Payer: Self-pay | Admitting: *Deleted

## 2018-03-02 ENCOUNTER — Inpatient Hospital Stay (HOSPITAL_BASED_OUTPATIENT_CLINIC_OR_DEPARTMENT_OTHER)
Admission: EM | Admit: 2018-03-02 | Discharge: 2018-03-04 | DRG: 698 | Disposition: A | Discharge status: Home / Self Care | Payer: Medicare Other | Attending: Internal Medicine | Admitting: Internal Medicine

## 2018-03-02 DIAGNOSIS — J9811 Atelectasis: Secondary | ICD-10-CM | POA: Diagnosis not present

## 2018-03-02 DIAGNOSIS — N3091 Cystitis, unspecified with hematuria: Secondary | ICD-10-CM | POA: Diagnosis not present

## 2018-03-02 DIAGNOSIS — Y92009 Unspecified place in unspecified non-institutional (private) residence as the place of occurrence of the external cause: Secondary | ICD-10-CM

## 2018-03-02 DIAGNOSIS — N179 Acute kidney failure, unspecified: Secondary | ICD-10-CM | POA: Diagnosis not present

## 2018-03-02 DIAGNOSIS — A4151 Sepsis due to Escherichia coli [E. coli]: Secondary | ICD-10-CM | POA: Diagnosis not present

## 2018-03-02 DIAGNOSIS — Y846 Urinary catheterization as the cause of abnormal reaction of the patient, or of later complication, without mention of misadventure at the time of the procedure: Secondary | ICD-10-CM

## 2018-03-02 DIAGNOSIS — E785 Hyperlipidemia, unspecified: Secondary | ICD-10-CM | POA: Diagnosis present

## 2018-03-02 DIAGNOSIS — T83511A Infection and inflammatory reaction due to indwelling urethral catheter, initial encounter: Principal | ICD-10-CM | POA: Diagnosis present

## 2018-03-02 DIAGNOSIS — Z87440 Personal history of urinary (tract) infections: Secondary | ICD-10-CM

## 2018-03-02 DIAGNOSIS — Z8551 Personal history of malignant neoplasm of bladder: Secondary | ICD-10-CM | POA: Diagnosis present

## 2018-03-02 DIAGNOSIS — Z85828 Personal history of other malignant neoplasm of skin: Secondary | ICD-10-CM

## 2018-03-02 DIAGNOSIS — Z7289 Other problems related to lifestyle: Secondary | ICD-10-CM | POA: Diagnosis present

## 2018-03-02 DIAGNOSIS — N390 Urinary tract infection, site not specified: Secondary | ICD-10-CM | POA: Diagnosis present

## 2018-03-02 DIAGNOSIS — A419 Sepsis, unspecified organism: Secondary | ICD-10-CM

## 2018-03-02 DIAGNOSIS — G4730 Sleep apnea, unspecified: Secondary | ICD-10-CM | POA: Diagnosis present

## 2018-03-02 DIAGNOSIS — N401 Enlarged prostate with lower urinary tract symptoms: Secondary | ICD-10-CM | POA: Diagnosis present

## 2018-03-02 DIAGNOSIS — I129 Hypertensive chronic kidney disease with stage 1 through stage 4 chronic kidney disease, or unspecified chronic kidney disease: Secondary | ICD-10-CM | POA: Diagnosis not present

## 2018-03-02 DIAGNOSIS — I10 Essential (primary) hypertension: Secondary | ICD-10-CM | POA: Diagnosis present

## 2018-03-02 DIAGNOSIS — Z7982 Long term (current) use of aspirin: Secondary | ICD-10-CM

## 2018-03-02 DIAGNOSIS — Z880 Allergy status to penicillin: Secondary | ICD-10-CM

## 2018-03-02 DIAGNOSIS — Z87891 Personal history of nicotine dependence: Secondary | ICD-10-CM

## 2018-03-02 DIAGNOSIS — Z789 Other specified health status: Secondary | ICD-10-CM | POA: Diagnosis present

## 2018-03-02 DIAGNOSIS — F1090 Alcohol use, unspecified, uncomplicated: Secondary | ICD-10-CM | POA: Diagnosis present

## 2018-03-02 DIAGNOSIS — C679 Malignant neoplasm of bladder, unspecified: Secondary | ICD-10-CM | POA: Diagnosis present

## 2018-03-02 DIAGNOSIS — N138 Other obstructive and reflux uropathy: Secondary | ICD-10-CM | POA: Diagnosis present

## 2018-03-02 DIAGNOSIS — Z882 Allergy status to sulfonamides status: Secondary | ICD-10-CM

## 2018-03-02 DIAGNOSIS — A4189 Other specified sepsis: Secondary | ICD-10-CM | POA: Diagnosis not present

## 2018-03-02 DIAGNOSIS — N182 Chronic kidney disease, stage 2 (mild): Secondary | ICD-10-CM | POA: Diagnosis present

## 2018-03-02 LAB — URINALYSIS, ROUTINE W REFLEX MICROSCOPIC
Bilirubin Urine: NEGATIVE
GLUCOSE, UA: NEGATIVE mg/dL
KETONES UR: NEGATIVE mg/dL
NITRITE: POSITIVE — AB
Protein, ur: >300 mg/dL — AB
SPECIFIC GRAVITY, URINE: 1.02 (ref 1.005–1.030)
pH: 7.5 (ref 5.0–8.0)

## 2018-03-02 LAB — COMPREHENSIVE METABOLIC PANEL
ALK PHOS: 56 U/L (ref 38–126)
ALT: 22 U/L (ref 17–63)
AST: 30 U/L (ref 15–41)
Albumin: 3.9 g/dL (ref 3.5–5.0)
Anion gap: 11 (ref 5–15)
BUN: 28 mg/dL — AB (ref 6–20)
CALCIUM: 8.9 mg/dL (ref 8.9–10.3)
CHLORIDE: 102 mmol/L (ref 101–111)
CO2: 22 mmol/L (ref 22–32)
CREATININE: 1.52 mg/dL — AB (ref 0.61–1.24)
GFR calc Af Amer: 49 mL/min — ABNORMAL LOW (ref >60)
GFR, EST NON AFRICAN AMERICAN: 42 mL/min — AB (ref >60)
Glucose, Bld: 113 mg/dL — ABNORMAL HIGH (ref 65–99)
Potassium: 4.1 mmol/L (ref 3.5–5.1)
SODIUM: 135 mmol/L (ref 135–145)
Total Bilirubin: 1.2 mg/dL (ref 0.3–1.2)
Total Protein: 7.3 g/dL (ref 6.5–8.1)

## 2018-03-02 LAB — CBC WITH DIFFERENTIAL/PLATELET
Basophils Absolute: 0 10*3/uL (ref 0.0–0.1)
Basophils Relative: 0 %
EOS ABS: 0 10*3/uL (ref 0.0–0.7)
EOS PCT: 0 %
HCT: 42.9 % (ref 39.0–52.0)
Hemoglobin: 14.9 g/dL (ref 13.0–17.0)
LYMPHS ABS: 0.2 10*3/uL — AB (ref 0.7–4.0)
LYMPHS PCT: 1 %
MCH: 31.8 pg (ref 26.0–34.0)
MCHC: 34.7 g/dL (ref 30.0–36.0)
MCV: 91.5 fL (ref 78.0–100.0)
MONO ABS: 0 10*3/uL — AB (ref 0.1–1.0)
Monocytes Relative: 0 %
Neutro Abs: 11 10*3/uL — ABNORMAL HIGH (ref 1.7–7.7)
Neutrophils Relative %: 99 %
PLATELETS: 235 10*3/uL (ref 150–400)
RBC: 4.69 MIL/uL (ref 4.22–5.81)
RDW: 14.3 % (ref 11.5–15.5)
WBC: 11.2 10*3/uL — ABNORMAL HIGH (ref 4.0–10.5)

## 2018-03-02 LAB — PROTIME-INR
INR: 1.09
PROTHROMBIN TIME: 14 s (ref 11.4–15.2)

## 2018-03-02 LAB — URINALYSIS, MICROSCOPIC (REFLEX)

## 2018-03-02 LAB — I-STAT CG4 LACTIC ACID, ED
Lactic Acid, Venous: 1.07 mmol/L (ref 0.5–1.9)
Lactic Acid, Venous: 0.8 mmol/L (ref 0.5–1.9)

## 2018-03-02 NOTE — ED Notes (Signed)
Patient transported to X-ray 

## 2018-03-02 NOTE — ED Notes (Signed)
Lactic results handed to Dr Regenia Skeeter

## 2018-03-02 NOTE — ED Notes (Signed)
Lactic results handed to Dr Regenia Skeeter.

## 2018-03-02 NOTE — ED Provider Notes (Signed)
[facility] EMERGENCY DEPARTMENT Provider Note   CSN: [id number] Arrival date & time: 03/02/18  2048     History   Chief Complaint Chief Complaint  Patient presents with  . Fever  . Chills    HPI Jeremy [name] is a 77 y.o. male.  HPI  77 year old male with prostatic hypertrophy who self catheterizes presents with fever and chills.  He states he has been doing self-catheterization for 3 or 4 years and over this time he is gotten about 3 or 4 urinary tract infections very similar to this.  His only warning is he develops chills and fevers.  This all started at 6 PM tonight.  He denies abdominal pain, dysuria, vomiting, back pain.  He has not been having headache, cough or shortness of breath.  Besides the chills he feels fine.  He went to urgent care where a urinalysis showed a urinary tract infection and he was given IM Rocephin.  Was sent here to rule out sepsis.  Past Medical History:  Diagnosis Date  . Enlarged prostate   . Hyperlipidemia   . Hypertension   . Self-catheterizes urinary bladder    pt. does this morning and evening--been doing this since 11/2016  . Sleep apnea    not using a cpap machine    Patient Active Problem List   Diagnosis Date Noted  . 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  . OSA (obstructive sleep apnea) 05/23/2016  . 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  . UTI (urinary tract infection) 09/21/2013  . Vitamin D deficiency 01/29/2010  . Osteoarthritis 01/29/2010  . 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 LEG EDEMA 10/13/2009  . OVERWEIGHT 11/20/2008  . GALLSTONES 11/20/2008  . Malignant neoplasm of bladder ([place]) 10/10/2008  . Chronic bronchitis ([place]) 10/10/2008  . Diverticulosis of large intestine 10/10/2008  . COLONIC POLYPS 12/07/2007  . HYPERCHOLESTEROLEMIA 12/07/2007  . Essential hypertension 12/07/2007  . HEMORRHOIDS 12/07/2007  . 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    Past  Surgical History:  Procedure Laterality Date  . BACK SURGERY    . 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 LUMBAR LAMINECTOMY/DECOMPRESSION MICRODISCECTOMY Bilateral 08/25/2017   Procedure: Bilateral Lumbar Three- Four, Lumbar Four- Five, Lumbar Five- Sacral One Laminectomy;  Surgeon: Kristeen Miss, MD;  Location: [facility];  Service: Neurosurgery;  Laterality: Bilateral;  Bilateral L3-4 L4-5 L5-S1 Laminectomy  . SKIN CANCER EXCISION     Surg x2...  . TONSILLECTOMY AND ADENOIDECTOMY     Surg as a child...        Home Medications    Prior to Admission medications   Medication Sig Start Date End Date Taking? Authorizing Provider  amLODipine (NORVASC) 10 MG tablet TAKE 1 TABLET EVERY DAY 02/11/18  Yes Noralee Space, MD  lisinopril (PRINIVIL,ZESTRIL) 20 MG tablet Take by mouth. 06/24/13  Yes [provider]  losartan (COZAAR) 100 MG tablet Take 1 tablet (100 mg total) by mouth daily. 03/11/17  Yes Noralee Space, MD  metoprolol succinate (TOPROL-XL) 50 MG 24 hr tablet TAKE 1 TABLET DAILY WITH OR IMMEDIATELY FOLLOWING A MEAL 11/05/17  Yes Noralee Space, MD  simvastatin (ZOCOR) 40 MG tablet TAKE 1 TABLET EVERY DAY 11/05/17  Yes Noralee Space, MD  tamsulosin (FLOMAX) 0.4 MG CAPS Take 0.4 mg by mouth daily after supper.    Yes [provider]  acetaminophen (TYLENOL) 500 MG tablet Take 1,000 mg by mouth every 6 (six) hours as needed for  moderate pain or headache.    [provider]  aspirin 81 MG tablet Take 81 mg by mouth daily.     [provider]  b complex vitamins capsule Take 2 capsules by mouth daily.    [provider]  HYDROcodone-acetaminophen (NORCO/VICODIN) 5-325 MG tablet Take 1 tablet by mouth every 8 (eight) hours as needed for pain. 08/01/17   [provider]  HYDROcodone-acetaminophen (NORCO/VICODIN) 5-325 MG tablet Take 1-2 tablets by mouth every 4 (four) hours as needed (breakthrough pain). 08/26/17   Kristeen Miss, MD    methocarbamol (ROBAXIN) 500 MG tablet Take 1 tablet (500 mg total) by mouth every 6 (six) hours as needed for muscle spasms. 08/26/17   Kristeen Miss, MD  metoprolol succinate (TOPROL-XL) 50 MG 24 hr tablet TAKE 1 TABLET DAILY WITH OR IMMEDIATELY FOLLOWING A MEAL 11/05/17   Noralee Space, MD  metoprolol succinate (TOPROL-XL) 50 MG 24 hr tablet Take by mouth. 06/24/13   [provider]  Multiple Vitamin (MULTIVITAMIN) capsule Take 1 capsule by mouth daily.    [provider]  naproxen sodium (ANAPROX) 220 MG tablet Take 440 mg by mouth 2 (two) times daily as needed (for pain).    [provider]  NON FORMULARY Apply 1-2 g topically 4 (four) times daily as needed (for pain in legs/feet). Compound : Ibuprofen15%/Baclofen1%/Lidocaine2%/Gabapentin3% 100g    [provider]  simvastatin (ZOCOR) 40 MG tablet Take 1 tablet (40 mg total) by mouth at bedtime. 11/05/17   Noralee Space, MD    Family History Family History  Problem Relation Age of Onset  . Colon cancer Neg Hx   . Stomach cancer Neg Hx   . Rectal cancer Neg Hx     Social History Social History   Tobacco Use  . Smoking status: Former Smoker    Packs/day: 0.80    Years: 50.00    Pack years: 40.00    Types: Cigarettes    Last attempt to quit: 06/12/2015    Years since quitting: 2.7  . Smokeless tobacco: Never Used  . Tobacco comment: uses vapor cigarette  Substance Use Topics  . Alcohol use: Yes    Comment: couple glasses with dinner  . Drug use: No     Allergies   Sulfa antibiotics and Penicillins   Review of Systems Review of Systems  Constitutional: Positive for chills and fever.  Respiratory: Negative for cough and shortness of breath.   Gastrointestinal: Negative for abdominal pain and vomiting.  Genitourinary: Negative for dysuria and penile pain.  Musculoskeletal: Negative for back pain.  Neurological: Negative for headaches.  All other systems reviewed and are  negative.    Physical Exam Updated Vital Signs BP 136/67   Pulse 90   Temp (!) 101.9 F (38.8 C) (Rectal)   Resp 18   Ht 5\' 11"  (1.803 m)   Wt 84.8 kg (187 lb)   SpO2 93%   BMI 26.08 kg/m   Physical Exam  Constitutional: He is oriented to person, place, and time. He appears well-developed and well-nourished. No distress.  HENT:  Head: Normocephalic and atraumatic.  Right Ear: External ear normal.  Left Ear: External ear normal.  Nose: Nose normal.  Eyes: Right eye exhibits no discharge. Left eye exhibits no discharge.  Neck: Neck supple.  Cardiovascular: Normal rate, regular rhythm and normal heart sounds.  HR high 90s, low 100s  Pulmonary/Chest: Effort normal and breath sounds normal. He has no rales.  Abdominal: Soft. He exhibits  no distension. There is no tenderness.  Musculoskeletal: He exhibits no edema.  Neurological: He is alert and oriented to person, place, and time.  Skin: Skin is warm and dry. He is not diaphoretic.  Nursing note and vitals reviewed.    ED Treatments / Results  Labs (all labs ordered are listed, but only abnormal results are displayed) Labs Reviewed  COMPREHENSIVE METABOLIC PANEL - Abnormal; Notable for the following components:      Result Value   Glucose, Bld 113 (*)    BUN 28 (*)    Creatinine, Ser 1.52 (*)    GFR calc non Af Amer 42 (*)    GFR calc Af Amer 49 (*)    All other components within normal limits  CBC WITH DIFFERENTIAL/PLATELET - Abnormal; Notable for the following components:   WBC 11.2 (*)    Neutro Abs 11.0 (*)    Lymphs Abs 0.2 (*)    Monocytes Absolute 0.0 (*)    All other components within normal limits  URINALYSIS, ROUTINE W REFLEX MICROSCOPIC - Abnormal; Notable for the following components:   APPearance HAZY (*)    Hgb urine dipstick SMALL (*)    Protein, ur >300 (*)    Nitrite POSITIVE (*)    Leukocytes, UA SMALL (*)    All other components within normal limits  URINALYSIS, MICROSCOPIC (REFLEX) -  Abnormal; Notable for the following components:   Bacteria, UA FEW (*)    Squamous Epithelial / LPF 0-5 (*)    All other components within normal limits  CULTURE, BLOOD (ROUTINE X 2)  CULTURE, BLOOD (ROUTINE X 2)  URINE CULTURE  PROTIME-INR  I-STAT CG4 LACTIC ACID, ED  I-STAT CG4 LACTIC ACID, ED    EKG None  Radiology Dg Chest 2 View  Result Date: 03/02/2018 CLINICAL DATA:  Acute onset of chills.  Fever. EXAM: CHEST - 2 VIEW COMPARISON:  Chest radiograph performed 03/11/2017 FINDINGS: The lungs are well-aerated. Pulmonary vascularity is at the upper limits of normal. Minimal bibasilar atelectasis is noted. There is no evidence of pleural effusion or pneumothorax. The heart is normal in size; the mediastinal contour is within normal limits. No acute osseous abnormalities are seen. IMPRESSION: Minimal bibasilar atelectasis noted.  Lungs otherwise clear. Electronically Signed   By: Garald Balding M.D.   On: 03/02/2018 21:48    Procedures Procedures (including critical care time)  Medications Ordered in ED Medications  0.9 %  sodium chloride infusion (has no administration in time range)     Initial Impression / Assessment and Plan / ED Course  I have reviewed the triage vital signs and the nursing notes.  Pertinent labs & imaging results that were available during my care of the patient were reviewed by me and considered in my medical decision making (see chart for details).     Patient meets criteria for sepsis given tachycardia, fever, elevated WBC as well as an acute urinary tract infection.  He was already given IM Rocephin just an hour or 2 before at urgent care.  Thus will hold on further antibiotics.  He was given a fluid bolus.  Otherwise he does not appear critically ill but I am concerned he is developing sepsis so quickly and will need further monitoring and supportive care.  I discussed with Dr. Roel Cluck, will admit and transfer to [facility].  Final Clinical  Impressions(s) / ED Diagnoses   Final diagnoses:  Sepsis due to urinary tract infection [facility])    ED Discharge Orders  None       Sherwood Gambler, MD 03/03/18 [phone]

## 2018-03-02 NOTE — ED Notes (Signed)
First BC was obtained from IV site.

## 2018-03-02 NOTE — ED Triage Notes (Signed)
He was seen at [facility] for chills and possible UTI. He self caths himself.

## 2018-03-03 DIAGNOSIS — Z7289 Other problems related to lifestyle: Secondary | ICD-10-CM | POA: Diagnosis present

## 2018-03-03 DIAGNOSIS — Y92009 Unspecified place in unspecified non-institutional (private) residence as the place of occurrence of the external cause: Secondary | ICD-10-CM | POA: Diagnosis not present

## 2018-03-03 DIAGNOSIS — T83511A Infection and inflammatory reaction due to indwelling urethral catheter, initial encounter: Secondary | ICD-10-CM | POA: Diagnosis not present

## 2018-03-03 DIAGNOSIS — N138 Other obstructive and reflux uropathy: Secondary | ICD-10-CM | POA: Diagnosis not present

## 2018-03-03 DIAGNOSIS — E785 Hyperlipidemia, unspecified: Secondary | ICD-10-CM | POA: Diagnosis not present

## 2018-03-03 DIAGNOSIS — Z882 Allergy status to sulfonamides status: Secondary | ICD-10-CM | POA: Diagnosis not present

## 2018-03-03 DIAGNOSIS — N179 Acute kidney failure, unspecified: Secondary | ICD-10-CM | POA: Diagnosis not present

## 2018-03-03 DIAGNOSIS — I129 Hypertensive chronic kidney disease with stage 1 through stage 4 chronic kidney disease, or unspecified chronic kidney disease: Secondary | ICD-10-CM | POA: Diagnosis not present

## 2018-03-03 DIAGNOSIS — N390 Urinary tract infection, site not specified: Secondary | ICD-10-CM | POA: Diagnosis not present

## 2018-03-03 DIAGNOSIS — Z7982 Long term (current) use of aspirin: Secondary | ICD-10-CM | POA: Diagnosis not present

## 2018-03-03 DIAGNOSIS — Z87891 Personal history of nicotine dependence: Secondary | ICD-10-CM | POA: Diagnosis not present

## 2018-03-03 DIAGNOSIS — A4151 Sepsis due to Escherichia coli [E. coli]: Secondary | ICD-10-CM | POA: Diagnosis not present

## 2018-03-03 DIAGNOSIS — Z87440 Personal history of urinary (tract) infections: Secondary | ICD-10-CM | POA: Diagnosis not present

## 2018-03-03 DIAGNOSIS — N401 Enlarged prostate with lower urinary tract symptoms: Secondary | ICD-10-CM | POA: Diagnosis not present

## 2018-03-03 DIAGNOSIS — Z880 Allergy status to penicillin: Secondary | ICD-10-CM | POA: Diagnosis not present

## 2018-03-03 DIAGNOSIS — F1090 Alcohol use, unspecified, uncomplicated: Secondary | ICD-10-CM | POA: Diagnosis present

## 2018-03-03 DIAGNOSIS — N3000 Acute cystitis without hematuria: Secondary | ICD-10-CM | POA: Diagnosis not present

## 2018-03-03 DIAGNOSIS — Z85828 Personal history of other malignant neoplasm of skin: Secondary | ICD-10-CM | POA: Diagnosis not present

## 2018-03-03 DIAGNOSIS — A419 Sepsis, unspecified organism: Secondary | ICD-10-CM | POA: Diagnosis not present

## 2018-03-03 DIAGNOSIS — Y846 Urinary catheterization as the cause of abnormal reaction of the patient, or of later complication, without mention of misadventure at the time of the procedure: Secondary | ICD-10-CM | POA: Diagnosis not present

## 2018-03-03 DIAGNOSIS — N182 Chronic kidney disease, stage 2 (mild): Secondary | ICD-10-CM

## 2018-03-03 DIAGNOSIS — I10 Essential (primary) hypertension: Secondary | ICD-10-CM | POA: Diagnosis not present

## 2018-03-03 DIAGNOSIS — G4730 Sleep apnea, unspecified: Secondary | ICD-10-CM | POA: Diagnosis not present

## 2018-03-03 DIAGNOSIS — Z789 Other specified health status: Secondary | ICD-10-CM | POA: Diagnosis present

## 2018-03-03 DIAGNOSIS — Z8551 Personal history of malignant neoplasm of bladder: Secondary | ICD-10-CM | POA: Diagnosis not present

## 2018-03-03 LAB — CBC
HEMATOCRIT: 40.7 % (ref 39.0–52.0)
Hemoglobin: 13.6 g/dL (ref 13.0–17.0)
MCH: 31.5 pg (ref 26.0–34.0)
MCHC: 33.4 g/dL (ref 30.0–36.0)
MCV: 94.2 fL (ref 78.0–100.0)
PLATELETS: 228 10*3/uL (ref 150–400)
RBC: 4.32 MIL/uL (ref 4.22–5.81)
RDW: 14.3 % (ref 11.5–15.5)
WBC: 21.2 10*3/uL — AB (ref 4.0–10.5)

## 2018-03-03 LAB — BRAIN NATRIURETIC PEPTIDE: B Natriuretic Peptide: 135.4 pg/mL — ABNORMAL HIGH (ref 0.0–100.0)

## 2018-03-03 LAB — BASIC METABOLIC PANEL
Anion gap: 11 (ref 5–15)
BUN: 30 mg/dL — AB (ref 6–20)
CO2: 23 mmol/L (ref 22–32)
CREATININE: 1.55 mg/dL — AB (ref 0.61–1.24)
Calcium: 8.7 mg/dL — ABNORMAL LOW (ref 8.9–10.3)
Chloride: 105 mmol/L (ref 101–111)
GFR calc Af Amer: 48 mL/min — ABNORMAL LOW (ref >60)
GFR, EST NON AFRICAN AMERICAN: 41 mL/min — AB (ref >60)
GLUCOSE: 128 mg/dL — AB (ref 65–99)
Potassium: 3.7 mmol/L (ref 3.5–5.1)
SODIUM: 139 mmol/L (ref 135–145)

## 2018-03-03 LAB — LACTIC ACID, PLASMA: LACTIC ACID, VENOUS: 1.4 mmol/L (ref 0.5–1.9)

## 2018-03-03 LAB — CREATININE, URINE, RANDOM: CREATININE, URINE: 39.24 mg/dL

## 2018-03-03 LAB — PROCALCITONIN: PROCALCITONIN: 37.73 ng/mL

## 2018-03-03 LAB — SODIUM, URINE, RANDOM: SODIUM UR: 13 mmol/L

## 2018-03-03 MED ORDER — FOLIC ACID 1 MG PO TABS
1.0000 mg | ORAL_TABLET | Freq: Every day | ORAL | Status: DC
  Administered 2018-03-03 – 2018-03-04 (×2): 1 mg via ORAL
  Filled 2018-03-03 (×2): qty 1

## 2018-03-03 MED ORDER — ACETAMINOPHEN 325 MG PO TABS: 650.0000 mg | ORAL_TABLET | Freq: Four times a day (QID) | ORAL | Status: DC | PRN

## 2018-03-03 MED ORDER — AMLODIPINE BESYLATE 10 MG PO TABS
10.0000 mg | ORAL_TABLET | Freq: Every day | ORAL | Status: DC
  Administered 2018-03-03 – 2018-03-04 (×2): 10 mg via ORAL
  Filled 2018-03-03 (×2): qty 1

## 2018-03-03 MED ORDER — VITAMIN B-1 100 MG PO TABS
100.0000 mg | ORAL_TABLET | Freq: Every day | ORAL | Status: DC
  Administered 2018-03-03 – 2018-03-04 (×2): 100 mg via ORAL
  Filled 2018-03-03 (×2): qty 1

## 2018-03-03 MED ORDER — LORAZEPAM 2 MG/ML IJ SOLN: 1.0000 mg | Freq: Four times a day (QID) | INTRAMUSCULAR | Status: DC | PRN

## 2018-03-03 MED ORDER — ENOXAPARIN SODIUM 40 MG/0.4ML ~~LOC~~ SOLN
40.0000 mg | SUBCUTANEOUS | Status: DC
  Administered 2018-03-03 – 2018-03-04 (×2): 40 mg via SUBCUTANEOUS
  Filled 2018-03-03 (×2): qty 0.4

## 2018-03-03 MED ORDER — SIMVASTATIN 40 MG PO TABS: 40.0000 mg | ORAL_TABLET | Freq: Every day | ORAL | Status: DC

## 2018-03-03 MED ORDER — SODIUM CHLORIDE 0.9 % IV SOLN
1.0000 g | INTRAVENOUS | Status: DC
  Administered 2018-03-03 – 2018-03-04 (×2): 1 g via INTRAVENOUS
  Filled 2018-03-03 (×2): qty 1

## 2018-03-03 MED ORDER — SODIUM CHLORIDE 0.9 % IV SOLN
INTRAVENOUS | Status: DC
  Administered 2018-03-03 – 2018-03-04 (×4): via INTRAVENOUS

## 2018-03-03 MED ORDER — TAMSULOSIN HCL 0.4 MG PO CAPS
0.4000 mg | ORAL_CAPSULE | Freq: Every day | ORAL | Status: DC
  Administered 2018-03-03: 0.4 mg via ORAL
  Filled 2018-03-03: qty 1

## 2018-03-03 MED ORDER — ZOLPIDEM TARTRATE 5 MG PO TABS: 5.0000 mg | ORAL_TABLET | Freq: Every evening | ORAL | Status: DC | PRN

## 2018-03-03 MED ORDER — B COMPLEX VITAMINS PO CAPS: 2.0000 | ORAL_CAPSULE | Freq: Every day | ORAL | Status: DC

## 2018-03-03 MED ORDER — HYDRALAZINE HCL 20 MG/ML IJ SOLN: 5.0000 mg | INTRAMUSCULAR | Status: DC | PRN

## 2018-03-03 MED ORDER — ONDANSETRON HCL 4 MG/2ML IJ SOLN: 4.0000 mg | Freq: Three times a day (TID) | INTRAMUSCULAR | Status: DC | PRN

## 2018-03-03 MED ORDER — LORAZEPAM 2 MG/ML IJ SOLN: 0.0000 mg | Freq: Four times a day (QID) | INTRAMUSCULAR | Status: DC

## 2018-03-03 MED ORDER — LORAZEPAM 1 MG PO TABS: 1.0000 mg | ORAL_TABLET | Freq: Four times a day (QID) | ORAL | Status: DC | PRN

## 2018-03-03 MED ORDER — B COMPLEX-C PO TABS
1.0000 | ORAL_TABLET | Freq: Every day | ORAL | Status: DC
  Administered 2018-03-03: 1 via ORAL
  Filled 2018-03-03 (×2): qty 1

## 2018-03-03 MED ORDER — ADULT MULTIVITAMIN W/MINERALS CH
1.0000 | ORAL_TABLET | Freq: Every day | ORAL | Status: DC
  Administered 2018-03-03 – 2018-03-04 (×2): 1 via ORAL
  Filled 2018-03-03 (×2): qty 1

## 2018-03-03 MED ORDER — MULTIVITAMINS PO CAPS: 1.0000 | ORAL_CAPSULE | Freq: Every day | ORAL | Status: DC

## 2018-03-03 MED ORDER — THIAMINE HCL 100 MG/ML IJ SOLN: 100.0000 mg | Freq: Every day | INTRAMUSCULAR | Status: DC

## 2018-03-03 MED ORDER — SODIUM CHLORIDE 0.9 % IV BOLUS
1500.0000 mL | Freq: Once | INTRAVENOUS | Status: AC
  Administered 2018-03-03: 1500 mL via INTRAVENOUS

## 2018-03-03 MED ORDER — LORAZEPAM 2 MG/ML IJ SOLN: 0.0000 mg | Freq: Two times a day (BID) | INTRAMUSCULAR | Status: DC

## 2018-03-03 MED ORDER — ADULT MULTIVITAMIN W/MINERALS CH
1.0000 | ORAL_TABLET | Freq: Every day | ORAL | Status: DC
  Filled 2018-03-03 (×2): qty 1

## 2018-03-03 MED ORDER — ATORVASTATIN CALCIUM 20 MG PO TABS
20.0000 mg | ORAL_TABLET | Freq: Every day | ORAL | Status: DC
  Administered 2018-03-03 – 2018-03-04 (×2): 20 mg via ORAL
  Filled 2018-03-03 (×2): qty 1

## 2018-03-03 MED ORDER — SENNOSIDES-DOCUSATE SODIUM 8.6-50 MG PO TABS: 1.0000 | ORAL_TABLET | Freq: Every evening | ORAL | Status: DC | PRN

## 2018-03-03 MED ORDER — ASPIRIN 81 MG PO CHEW
81.0000 mg | CHEWABLE_TABLET | Freq: Every day | ORAL | Status: DC
Start: 2018-03-03 — End: 2018-03-04
  Administered 2018-03-03 – 2018-03-04 (×2): 81 mg via ORAL
  Filled 2018-03-03 (×2): qty 1

## 2018-03-03 MED ORDER — SODIUM CHLORIDE 0.9 % IV BOLUS
1000.0000 mL | Freq: Once | INTRAVENOUS | Status: AC
  Administered 2018-03-03: 1000 mL via INTRAVENOUS

## 2018-03-03 NOTE — Plan of Care (Signed)
Called from [facility] by Dr. Marland Kitchen  @[email or web address] @    Javier Docker 77 y.o. With  Past hx of bladder cancer and self caths UTIs in the past    Presents today with  Chief Complaint  Patient presents with  . Fever  . Chills   originally seen at urgent care was already started on IM antibiotics but continued to have fevers presented to med Center at [facility]  Blood pressure 136/67, pulse 90, temperature (!) 101.9 F (38.8 C), temperature source Rectal, resp. rate 18, height 5\' 11"  (1.803 m), weight 84.8 kg (187 lb), SpO2 93 %.   Meeting criteria for sepsis    Significant findings:   Lactic Acid, Venous    Component Value Date/Time   LATICACIDVEN 0.80 03/02/2018 2322   Blood cultures and urine cultures ordered currently pending Radiology CXR no acute      Plan to Admit    On TELE  Panayiotis [place] 12:12 AM

## 2018-03-03 NOTE — H&P (Signed)
History and Physical    Jeremy [name] FIE:[id number] DOB: Feb 14, 1941 DOA: 03/02/2018  Referring MD/NP/PA:   PCP: Noralee Space, MD   Patient coming from:  The patient is coming from home.  At baseline, pt is independent for most of ADL.        Chief Complaint: Fever, chills  HPI: Jeremy [name] is a 77 y.o. male with medical history significant of bladder cancer (s/p of excision 2015, no chemo or radiation therapy), self catheterization, BPH, OSA not on CPAP, hypertension, hyperlipidemia, recurrent UTI, CKD-2, who presents with fever and chills.   Patient states that he is doing self-catheterization, and had history of recurrent UTI.  He developed fever and chills at home about 6 PM.  He states that he has discomfort when he does self catheterization today.  No hematuria, no flank pain.  Patient denies nausea, vomiting, diarrhea, abdominal pain.  No chest pain, cough, shortness of breath or unilateral weakness.  Patient states that he drinks 2-3 glasses of wine every day.  ED Course: pt was found to have positive urinalysis with small amount of leukocyte and positive nitrite, WBC 11.0, lactic acid of 1.07, 0.80, INR 1.09, worsening renal function, temperature 101.9, tachycardia, tachypnea, oxygen sat of 91-96% on room air, negative chest x-ray.  Patient is admitted to telemetry bed as an inpatient.  Review of Systems:   General: has fevers, chills, no body weight gain, has poor appetite, has fatigue HEENT: no blurry vision, hearing changes or sore throat Respiratory: no dyspnea, coughing, wheezing CV: no chest pain, no palpitations GI: no nausea, vomiting, abdominal pain, diarrhea, constipation GU: no dysuria, burning on urination, increased urinary frequency, hematuria  Ext: no leg edema Neuro: no unilateral weakness, numbness, or tingling, no vision change or hearing loss Skin: no rash, no skin tear. MSK: No muscle spasm, no deformity, no limitation of range of movement in spin Heme:  No easy bruising.  Travel history: No recent long distant travel.  Allergy:  Allergies  Allergen Reactions  . Sulfa Antibiotics Other (See Comments)    Rash, light headed, shaking  . Penicillins Other (See Comments)    REACTION: hives severe as a child    Past Medical History:  Diagnosis Date  . Enlarged prostate   . Hyperlipidemia   . Hypertension   . Self-catheterizes urinary bladder    pt. does this morning and evening--been doing this since 11/2016  . Sleep apnea    not using a cpap machine    Past Surgical History:  Procedure Laterality Date  . BACK SURGERY    . 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 LUMBAR LAMINECTOMY/DECOMPRESSION MICRODISCECTOMY Bilateral 08/25/2017   Procedure: Bilateral Lumbar Three- Four, Lumbar Four- Five, Lumbar Five- Sacral One Laminectomy;  Surgeon: Kristeen Miss, MD;  Location: [place];  Service: Neurosurgery;  Laterality: Bilateral;  Bilateral L3-4 L4-5 L5-S1 Laminectomy  . SKIN CANCER EXCISION     Surg x2...  . TONSILLECTOMY AND ADENOIDECTOMY     Surg as a child...    Social History:  reports that he quit smoking about 2 years ago. His smoking use included cigarettes. He has a 40.00 pack-year smoking history. He has never used smokeless tobacco. He reports that he drinks alcohol. He reports that he does not use drugs.  Family History:  Family History  Problem Relation Age of Onset  . Colon cancer Neg Hx   . Stomach cancer Neg Hx   . Rectal cancer Neg Hx  Prior to Admission medications   Medication Sig Start Date End Date Taking? Authorizing Provider  amLODipine (NORVASC) 10 MG tablet TAKE 1 TABLET EVERY DAY 02/11/18  Yes Noralee Space, MD  aspirin 81 MG tablet Take 81 mg by mouth daily.    Yes [provider]  lisinopril (PRINIVIL,ZESTRIL) 20 MG tablet Take by mouth. 06/24/13  Yes [provider]  losartan (COZAAR) 100 MG tablet Take 1 tablet (100 mg total) by mouth daily. 03/11/17  Yes Noralee Space, MD    Multiple Vitamin (MULTIVITAMIN) capsule Take 1 capsule by mouth daily.   Yes [provider]  tamsulosin (FLOMAX) 0.4 MG CAPS Take 0.4 mg by mouth daily after supper.    Yes [provider]  acetaminophen (TYLENOL) 500 MG tablet Take 1,000 mg by mouth every 6 (six) hours as needed for moderate pain or headache.    [provider]  b complex vitamins capsule Take 2 capsules by mouth daily.    [provider]  naproxen sodium (ANAPROX) 220 MG tablet Take 440 mg by mouth 2 (two) times daily as needed (for pain).    [provider]  NON FORMULARY Apply 1-2 g topically 4 (four) times daily as needed (for pain in legs/feet). Compound : Ibuprofen15%/Baclofen1%/Lidocaine2%/Gabapentin3% 100g    [provider]    Physical Exam: Vitals:   03/02/18 2330 03/03/18 0000 03/03/18 0140 03/03/18 0143  BP: 136/67 132/61 (!) 120/58   Pulse: 90 89 83   Resp: 18 (!) 26 (!) 22   Temp:   98.2 F (36.8 C)   TempSrc:   Axillary   SpO2: 93% 96% 96%   Weight:    86.1 kg (189 lb 13.1 oz)  Height:    5\' 11"  (1.803 m)   General: Not in acute distress HEENT:       Eyes: PERRL, EOMI, no scleral icterus.       ENT: No discharge from the ears and nose, no pharynx injection, no tonsillar enlargement.        Neck: No JVD, no bruit, no mass felt. Heme: No neck lymph node enlargement. Cardiac: Z6/X0, RRR, 1/6 systolic murmurs, No gallops or rubs. Respiratory: No rales, wheezing, rhonchi or rubs. GI: Soft, nondistended, nontender, no rebound pain, no organomegaly, BS present. GU: No hematuria. No CVA tenderness. Ext: No pitting leg edema bilaterally. 2+DP/PT pulse bilaterally. Musculoskeletal: No joint deformities, No joint redness or warmth, no limitation of ROM in spin. Skin: No rashes.  Neuro: Alert, oriented X3, cranial nerves II-XII grossly intact, moves all extremities normally.  Psych: Patient is not psychotic, no suicidal or hemocidal ideation.  Labs on  Admission: I have personally reviewed following labs and imaging studies  CBC: Recent Labs  Lab 03/02/18 2113 03/03/18 0227  WBC 11.2* 21.2*  NEUTROABS 11.0*  --   HGB 14.9 13.6  HCT 42.9 40.7  MCV 91.5 94.2  PLT 235 960   Basic Metabolic Panel: Recent Labs  Lab 03/02/18 2113  NA 135  K 4.1  CL 102  CO2 22  GLUCOSE 113*  BUN 28*  CREATININE 1.52*  CALCIUM 8.9   GFR: Estimated Creatinine Clearance: 43.3 mL/min (A) (by C-G formula based on SCr of 1.52 mg/dL (H)). Liver Function Tests: Recent Labs  Lab 03/02/18 2113  AST 30  ALT 22  ALKPHOS 56  BILITOT 1.2  PROT 7.3  ALBUMIN 3.9   No results for input(s): LIPASE, AMYLASE in the last 168 hours. No results for input(s): AMMONIA  in the last 168 hours. Coagulation Profile: Recent Labs  Lab 03/02/18 2113  INR 1.09   Cardiac Enzymes: No results for input(s): CKTOTAL, CKMB, CKMBINDEX, TROPONINI in the last 168 hours. BNP (last 3 results) No results for input(s): PROBNP in the last 8760 hours. HbA1C: No results for input(s): HGBA1C in the last 72 hours. CBG: No results for input(s): GLUCAP in the last 168 hours. Lipid Profile: No results for input(s): CHOL, HDL, LDLCALC, TRIG, CHOLHDL, LDLDIRECT in the last 72 hours. Thyroid Function Tests: No results for input(s): TSH, T4TOTAL, FREET4, T3FREE, THYROIDAB in the last 72 hours. Anemia Panel: No results for input(s): VITAMINB12, FOLATE, FERRITIN, TIBC, IRON, RETICCTPCT in the last 72 hours. Urine analysis:    Component Value Date/Time   COLORURINE YELLOW 03/02/2018 2220   APPEARANCEUR HAZY (A) 03/02/2018 2220   LABSPEC 1.020 03/02/2018 2220   PHURINE 7.5 03/02/2018 2220   GLUCOSEU NEGATIVE 03/02/2018 2220   GLUCOSEU NEGATIVE 01/14/2011 0938   HGBUR SMALL (A) 03/02/2018 2220   BILIRUBINUR NEGATIVE 03/02/2018 2220   KETONESUR NEGATIVE 03/02/2018 2220   PROTEINUR >300 (A) 03/02/2018 2220   UROBILINOGEN 0.2 07/29/2013 1637   NITRITE POSITIVE (A) 03/02/2018  2220   LEUKOCYTESUR SMALL (A) 03/02/2018 2220   Sepsis Labs: @LABRCNTIP (procalcitonin:4,lacticidven:4) )No results found for this or any previous visit (from the past 240 hour(s)).   Radiological Exams on Admission: Dg Chest 2 View  Result Date: 03/02/2018 CLINICAL DATA:  Acute onset of chills.  Fever. EXAM: CHEST - 2 VIEW COMPARISON:  Chest radiograph performed 03/11/2017 FINDINGS: The lungs are well-aerated. Pulmonary vascularity is at the upper limits of normal. Minimal bibasilar atelectasis is noted. There is no evidence of pleural effusion or pneumothorax. The heart is normal in size; the mediastinal contour is within normal limits. No acute osseous abnormalities are seen. IMPRESSION: Minimal bibasilar atelectasis noted.  Lungs otherwise clear. Electronically Signed   By: Garald Balding M.D.   On: 03/02/2018 21:48     EKG: Not done in ED, will get one.   Assessment/Plan Principal Problem:   UTI (urinary tract infection) Active Problems:   Malignant neoplasm of bladder (HCC)   Essential hypertension   Benign prostatic hyperplasia with urinary obstruction   Sepsis ([place])   Acute renal failure superimposed on stage 2 chronic kidney disease ([place])   Alcohol use  Sepsis due to UTI: patient meets criteria for sepsis with leukocytosis, tachycardia, tachypnea and fever. Lactic acid is normal. Currently hemodynamically stable.   - Admit to telemetry bed as inpt -  Place on med-surg bed for obs -  Ceftriaxone by IV - Follow up results of urine and blood cx and amend antibiotic regimen if needed per sensitivity results - prn Zofran for nausea - will get Procalcitonin and trend lactic acid levels per sepsis protocol. - IVF: 2.5 L of NS bolus in ED, followed by 100 cc/h   Malignant neoplasm of bladder [facility]): s/p of excision. No chemo or XRT.  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 Likely due to prerenal secondary to dehydration and continuation of ACEI, ARB, NSAIDs. - IVF as above - Follow up renal function by BMP - Check FeNa  - Hold lisinopril, Cozaar, naproxen  Alcohol use:  -CIWA protocol  DVT ppx: SQ Lovenox Code Status: Full code Family Communication: None at bed side.    Disposition Plan:  Anticipate discharge back to previous home environment Consults called:  none Admission status:  Inpatient/tele      Date of Service 03/03/2018    Ivor Costa [facility] Pager [phone]  If 7PM-7AM, please contact night-coverage [email or web address] Password [email or web address] 03/03/2018, 2:48 AM

## 2018-03-03 NOTE — Progress Notes (Signed)
[facility] PROGRESS NOTE    Progress Note  Jeremy [name]  NFA:[id number] DOB: 12-May-1941 DOA: 03/02/2018 PCP: Noralee Space, MD     Brief Narrative:   Jeremy [name] is an 77 y.o. male past medical history past medical history of recurrent UTIs, with recurrent UTIs,bladder cancer status post excision in 2015past medical history of bladder cancer status post excision in 2015 with self-catheterization now comes in with fever and chills with self-catheterization now comes in with fever and chills, he relates that the day prior to admission, he relates that the day prior to admission he noticed some discomfort when catheterizing he noticed some discomfort when catheterizing  Assessment/Plan:   Sepsis ([place]) UTI (urinary tract infection) Agree with IV Rocephin agree with IV Rocephin. Blood cultures and urine cultures are pending. Continue Zofran for nauseaContinue Zofran for nausea His leukocytosis is worse he continues to have a persistent elevation in his renal function. The patient did want to go home and I told him he would had to leave [place].  History Malignant neoplasm of bladder (HCC) Status post excisionStatus post excision  Essential hypertension Continue to hold antihypertensive medication blood pressure seems to be improving slowly.  BPH: Continue Flomax.  Acute kidney injury superimposed on chronic kidney disease stage II: Creatinine at baseline is usually around 1.2 on admission it was 1.6, in the setting of diuretic and NSAID use, will continue IV fluids recheck a basic metabolic panel in the morning. Her sodium was less than 20, with a calculated Fina of less than 1% continue IV fluids.  Alcohol use Continue to monitor with Ativan protocol   DVT prophylaxis: lovenox Family Communication: Wife Disposition Plan/Barrier to D/C: home in am if WBC and renal function improved. Code Status:     Code Status Orders  (From admission, onward)         Start     Ordered   03/03/18 0152  Full code  Continuous     03/03/18 0152    Code Status History    Date Active Date Inactive Code Status Order ID Comments User Context   08/25/2017 1622 08/26/2017 1133 Full Code [id number]  Kristeen Miss, MD Inpatient   07/29/2013 1742 07/30/2013 1854 Full Code [id number]  Elmarie Shiley, MD Inpatient    Advance Directive Documentation     Most Recent Value  Type of Advance Directive  Healthcare Power of [place], Living will  Pre-existing out of facility DNR order (yellow form or pink MOST form)  -  "MOST" Form in Place?  -        IV Access:    Peripheral IV   Procedures and diagnostic studies:   Dg Chest 2 View  Result Date: 03/02/2018 CLINICAL DATA:  Acute onset of chills.  Fever. EXAM: CHEST - 2 VIEW COMPARISON:  Chest radiograph performed 03/11/2017 FINDINGS: The lungs are well-aerated. Pulmonary vascularity is at the upper limits of normal. Minimal bibasilar atelectasis is noted. There is no evidence of pleural effusion or pneumothorax. The heart is normal in size; the mediastinal contour is within normal limits. No acute osseous abnormalities are seen. IMPRESSION: Minimal bibasilar atelectasis noted.  Lungs otherwise clear. Electronically Signed   By: Garald Balding M.D.   On: 03/02/2018 21:48     Medical Consultants:    None.  Anti-Infectives:   IV Rocephin  Subjective:    Jeremy [name] no new complaints patient wanted to go home  Objective:    Vitals:   03/03/18  0143 03/03/18 0425 03/03/18 0837 03/03/18 1101  BP:  128/70 133/61   Pulse:  67 70   Resp:  20    Temp:  97.9 F (36.6 C) 97.9 F (36.6 C)   TempSrc:  Oral Oral   SpO2:  94%  95%  Weight: 86.1 kg (189 lb 13.1 oz)     Height: 5\' 11"  (1.803 m)       Intake/Output Summary (Last 24 hours) at 03/03/2018 1251 Last data filed at 03/03/2018 1100 Gross per 24 hour  Intake 1005 ml  Output 1500 ml  Net -495 ml   Filed Weights   03/02/18 2053  03/03/18 0143  Weight: 84.8 kg (187 lb) 86.1 kg (189 lb 13.1 oz)    Exam: General exam: In no acute distress. Respiratory system: Good air movement and clear to auscultation. Cardiovascular system: S1 & S2 heard, RRR.  Gastrointestinal system: Abdomen is nondistended, soft and nontender.  Central nervous system: Alert and oriented. No focal neurological deficits. Extremities: No pedal edema. Skin: No rashes, lesions or ulcers Psychiatry: Judgement and insight appear normal. Mood & affect appropriate.    Data Reviewed:    Labs: Basic Metabolic Panel: Recent Labs  Lab 03/02/18 2113 03/03/18 0227  NA 135 139  K 4.1 3.7  CL 102 105  CO2 22 23  GLUCOSE 113* 128*  BUN 28* 30*  CREATININE 1.52* 1.55*  CALCIUM 8.9 8.7*   GFR Estimated Creatinine Clearance: 42.5 mL/min (A) (by C-G formula based on SCr of 1.55 mg/dL (H)). Liver Function Tests: Recent Labs  Lab 03/02/18 2113  AST 30  ALT 22  ALKPHOS 56  BILITOT 1.2  PROT 7.3  ALBUMIN 3.9   No results for input(s): LIPASE, AMYLASE in the last 168 hours. No results for input(s): AMMONIA in the last 168 hours. Coagulation profile Recent Labs  Lab 03/02/18 2113  INR 1.09    CBC: Recent Labs  Lab 03/02/18 2113 03/03/18 0227  WBC 11.2* 21.2*  NEUTROABS 11.0*  --   HGB 14.9 13.6  HCT 42.9 40.7  MCV 91.5 94.2  PLT 235 228   Cardiac Enzymes: No results for input(s): CKTOTAL, CKMB, CKMBINDEX, TROPONINI in the last 168 hours. BNP (last 3 results) No results for input(s): PROBNP in the last 8760 hours. CBG: No results for input(s): GLUCAP in the last 168 hours. D-Dimer: No results for input(s): DDIMER in the last 72 hours. Hgb A1c: No results for input(s): HGBA1C in the last 72 hours. Lipid Profile: No results for input(s): CHOL, HDL, LDLCALC, TRIG, CHOLHDL, LDLDIRECT in the last 72 hours. Thyroid function studies: No results for input(s): TSH, T4TOTAL, T3FREE, THYROIDAB in the last 72 hours.  Invalid  input(s): FREET3 Anemia work up: No results for input(s): VITAMINB12, FOLATE, FERRITIN, TIBC, IRON, RETICCTPCT in the last 72 hours. Sepsis Labs: Recent Labs  Lab 03/02/18 2113 03/02/18 2116 03/02/18 2322 03/03/18 0227 03/03/18 0527  PROCALCITON  --   --   --  37.73  --   WBC 11.2*  --   --  21.2*  --   LATICACIDVEN  --  1.07 0.80  --  1.4   Microbiology Recent Results (from the past 240 hour(s))  Culture, blood (Routine x 2)     Status: None (Preliminary result)   Collection Time: 03/02/18  9:05 PM  Result Value Ref Range Status   Specimen Description   Final    BLOOD LEFT ANTECUBITAL Performed at [facility], [address] [place],  [place] [address]    Special Requests   Final    BOTTLES DRAWN AEROBIC AND ANAEROBIC Blood Culture adequate volume Performed at [facility], [address] [place], [place] [address]    Culture   Final    NO GROWTH < 12 HOURS Performed at [facility] [address] [place], [place] [address]    Report Status PENDING  Incomplete     Medications:   . amLODipine  10 mg Oral Daily  . aspirin  81 mg Oral Daily  . atorvastatin  20 mg Oral Daily  . B-complex with vitamin C  1 tablet Oral Daily  . enoxaparin (LOVENOX) injection  40 mg Subcutaneous Q24H  . folic acid  1 mg Oral Daily  . LORazepam  0-4 mg Intravenous Q6H   Followed by  . [START ON 03/05/2018] LORazepam  0-4 mg Intravenous Q12H  . multivitamin with minerals  1 tablet Oral Daily  . multivitamin with minerals  1 tablet Oral Daily  . tamsulosin  0.4 mg Oral QPC supper  . thiamine  100 mg Oral Daily   Or  . thiamine  100 mg Intravenous Daily   Continuous Infusions: . sodium chloride 100 mL/hr at 03/03/18 1100  . cefTRIAXone (ROCEPHIN)  IV Stopped (03/03/18 0236)     LOS: 0 days   Charlynne Cousins  [facility] Pager [phone]  *Please refer to [email or web address], password [email or web address] to get updated schedule on who will round on this patient, as  hospitalists switch teams weekly. If 7PM-7AM, please contact night-coverage at [email or web address], password [email or web address] for any overnight needs.  03/03/2018, 12:51 PM

## 2018-03-04 DIAGNOSIS — N390 Urinary tract infection, site not specified: Secondary | ICD-10-CM

## 2018-03-04 DIAGNOSIS — N138 Other obstructive and reflux uropathy: Secondary | ICD-10-CM

## 2018-03-04 DIAGNOSIS — N3000 Acute cystitis without hematuria: Secondary | ICD-10-CM

## 2018-03-04 DIAGNOSIS — A4151 Sepsis due to Escherichia coli [E. coli]: Secondary | ICD-10-CM

## 2018-03-04 DIAGNOSIS — A419 Sepsis, unspecified organism: Secondary | ICD-10-CM

## 2018-03-04 DIAGNOSIS — N401 Enlarged prostate with lower urinary tract symptoms: Secondary | ICD-10-CM

## 2018-03-04 DIAGNOSIS — I10 Essential (primary) hypertension: Secondary | ICD-10-CM

## 2018-03-04 LAB — BASIC METABOLIC PANEL
Anion gap: 10 (ref 5–15)
BUN: 17 mg/dL (ref 6–20)
CO2: 20 mmol/L — ABNORMAL LOW (ref 22–32)
CREATININE: 1.21 mg/dL (ref 0.61–1.24)
Calcium: 8.3 mg/dL — ABNORMAL LOW (ref 8.9–10.3)
Chloride: 109 mmol/L (ref 101–111)
GFR calc non Af Amer: 56 mL/min — ABNORMAL LOW (ref >60)
Glucose, Bld: 101 mg/dL — ABNORMAL HIGH (ref 65–99)
Potassium: 3.7 mmol/L (ref 3.5–5.1)
SODIUM: 139 mmol/L (ref 135–145)

## 2018-03-04 LAB — CBC
HCT: 39.6 % (ref 39.0–52.0)
Hemoglobin: 12.9 g/dL — ABNORMAL LOW (ref 13.0–17.0)
MCH: 30.8 pg (ref 26.0–34.0)
MCHC: 32.6 g/dL (ref 30.0–36.0)
MCV: 94.5 fL (ref 78.0–100.0)
Platelets: 198 10*3/uL (ref 150–400)
RBC: 4.19 MIL/uL — ABNORMAL LOW (ref 4.22–5.81)
RDW: 14.6 % (ref 11.5–15.5)
WBC: 11.1 10*3/uL — ABNORMAL HIGH (ref 4.0–10.5)

## 2018-03-04 MED ORDER — ATORVASTATIN CALCIUM 20 MG PO TABS: 20.0000 mg | ORAL_TABLET | Freq: Every day | ORAL | 0 refills | Status: DC

## 2018-03-04 MED ORDER — ACETAMINOPHEN 500 MG PO TABS: 500.0000 mg | ORAL_TABLET | Freq: Four times a day (QID) | ORAL | 0 refills | Status: DC | PRN

## 2018-03-04 MED ORDER — CEPHALEXIN 500 MG PO CAPS
500.0000 mg | ORAL_CAPSULE | Freq: Four times a day (QID) | ORAL | Status: DC
  Administered 2018-03-04: 500 mg via ORAL
  Filled 2018-03-04: qty 1

## 2018-03-04 MED ORDER — CEPHALEXIN 500 MG PO CAPS: 500.0000 mg | ORAL_CAPSULE | Freq: Four times a day (QID) | ORAL | 0 refills | Status: AC

## 2018-03-04 NOTE — Progress Notes (Signed)
Went over all discharge papers with patient.  All questions answered.  VSS.  Explained importance of taking medications as prescribed.  Pt walked out by NT.

## 2018-03-04 NOTE — Discharge Summary (Addendum)
Physician Discharge Summary  Jeremy [name] MHD:[id number] DOB: Mar 11, 1941 DOA: 03/02/2018  PCP: Noralee Space, MD  Admit date: 03/02/2018 Discharge date: 03/04/2018  Admitted From: Home  Disposition:  Home  Recommendations for Outpatient Follow-up and new medication changes:  1. Follow up with PCP in 1- week 2. Patient will take Cephalexin 500 mg qid for 7 days 3. Zocor has been changed to Lipitor due to drug interactions with Amlodipine.   Home Health: no   Equipment/Devices: no    Discharge Condition: stable  CODE STATUS: Full  Diet recommendation:  Heart healthy diet.   Brief/Interim Summary: 77 year old male who presented with fevers and chills.  He does have the  significant past medical history for bladder cancer, stage II chronic kidney disease, hypertension, dyslipidemia and BPH requiring self bladder catheterizations complicated by recurrent urine tract infections.  Patient developed fevers and chills for about 24 hours prior to hospitalization, positive discomfort while doing self catheterizations.  Upon the initial medical evaluation he was febrile and tachycardic, he was referred to the hospital due to suspected sepsis.  On the physical examination blood pressure 136/67, heart rate 90, respiratory 26, temperature 98.2, oxygen saturation is 96%.  Dry mucous membranes, lungs clear to auscultation bilaterally, heart S1-S2 present rhythmic, the abdomen was soft nontender, no lower extremity edema.  Sodium 139, potassium 3.7, chloride 105, bicarb 23, glucose 120, BUN 30, creatinine 1.55, white count 21.2, hemoglobin 13.6, hematocrit 40.7, platelets 228.  Urine analysis greater than 300 protein, specific gravity 1.020, white cells 6-30.  Chest x-ray with increased lung markings bilaterally.  EKG normal sinus rhythm, normal intervals, normal axis.  Patient was admitted to the hospital with a working diagnosis of sepsis due to complicated urinary tract infection and acute kidney injury on  chronic kidney disease stage II.  1.  Sepsis due to complicated urinary tract infection. (Present on admission), Related to intermittent bladder catheterizations  Patient was admitted to the medical ward, placed on a remote telemetry monitor, he received IV fluids and IV antibiotic therapy with ceftriaxone.  Patient responded well to medical therapy, he has remained afebrile for greater than 24 hours, his white cell count is down to 11.1.  His urinary cultures grew 10,000 colony-forming units of E. Coli.  Patient has been discharged on cephalexin for the next 7 days.  Follow-up with urology as an outpatient.  2.  Acute kidney injury chronic kidney disease stage II.  Patient received IV fluids, nephrotoxic agents were held, patient's kidney function improved, discharge creatinine 1.21.   3.  Hypertension.  Antihypertensive agents were held during this hospitalization, patient will resume losartan and amlodipine at discharge.  4.  BPH and history of bladder cancer.  Continue self-bladder catheterization, follow-up with urology as an outpatient.  Continue Flomax.  5.  Dyslipidemia.  Zocor was changed to Lipitor due to drug interactions with amlodipine.    Discharge Diagnoses:  Principal Problem:   UTI (urinary tract infection) Active Problems:   Malignant neoplasm of bladder (HCC)   Essential hypertension   Benign prostatic hyperplasia with urinary obstruction   Sepsis ([place])   Acute renal failure superimposed on stage 2 chronic kidney disease ([place])   Alcohol use    Discharge Instructions  Discharge Instructions    Diet - low sodium heart healthy   Complete by:  As directed    Discharge instructions   Complete by:  As directed    Please follow up with primary care in 7 days.   Increase activity  slowly   Complete by:  As directed      Allergies as of 03/04/2018      Reactions   Sulfa Antibiotics Other (See Comments)   Rash, light headed, shaking   Penicillins Other (See Comments)    REACTION: hives severe as a child .pen      Medication List    STOP taking these medications   lisinopril 20 MG tablet Commonly known as:  PRINIVIL,ZESTRIL   simvastatin 40 MG tablet Commonly known as:  ZOCOR     TAKE these medications   acetaminophen 500 MG tablet Commonly known as:  TYLENOL Take 1 tablet (500 mg total) by mouth every 6 (six) hours as needed for moderate pain or headache. What changed:  how much to take   amLODipine 10 MG tablet Commonly known as:  NORVASC TAKE 1 TABLET EVERY DAY What changed:    how much to take  how to take this  when to take this   aspirin 81 MG tablet Take 81 mg by mouth daily.   atorvastatin 20 MG tablet Commonly known as:  LIPITOR Take 1 tablet (20 mg total) by mouth daily. Start taking on:  03/05/2018   b complex vitamins capsule Take 2 capsules by mouth daily.   cephALEXin 500 MG capsule Commonly known as:  KEFLEX Take 1 capsule (500 mg total) by mouth every 6 (six) hours for 7 days.   losartan 100 MG tablet Commonly known as:  COZAAR Take 1 tablet (100 mg total) by mouth daily.   multivitamin capsule Take 1 capsule by mouth daily.   naproxen sodium 220 MG tablet Commonly known as:  ALEVE Take 440 mg by mouth 2 (two) times daily as needed (for pain).   NON FORMULARY Apply 1-2 g topically 4 (four) times daily as needed (for pain in legs/feet). Compound : Ibuprofen15%/Baclofen1%/Lidocaine2%/Gabapentin3% 100g   tamsulosin 0.4 MG Caps capsule Commonly known as:  FLOMAX Take 0.4 mg by mouth daily after supper.       Allergies  Allergen Reactions  . Sulfa Antibiotics Other (See Comments)    Rash, light headed, shaking  . Penicillins Other (See Comments)    REACTION: hives severe as a child .pen    Consultations:     Procedures/Studies: Dg Chest 2 View  Result Date: 03/02/2018 CLINICAL DATA:  Acute onset of chills.  Fever. EXAM: CHEST - 2 VIEW COMPARISON:  Chest radiograph performed 03/11/2017  FINDINGS: The lungs are well-aerated. Pulmonary vascularity is at the upper limits of normal. Minimal bibasilar atelectasis is noted. There is no evidence of pleural effusion or pneumothorax. The heart is normal in size; the mediastinal contour is within normal limits. No acute osseous abnormalities are seen. IMPRESSION: Minimal bibasilar atelectasis noted.  Lungs otherwise clear. Electronically Signed   By: Garald Balding M.D.   On: 03/02/2018 21:48       Subjective: Patient is feeling better, no dyspnea or chest pain, no nausea or vomiting. Tolerating po well.   Discharge Exam: Vitals:   03/04/18 0438 03/04/18 1406  BP: (!) 159/72 (!) 156/80  Pulse: 78 69  Resp:  16  Temp: 98.4 F (36.9 C) 98.1 F (36.7 C)  SpO2: 91% 90%   Vitals:   03/03/18 1800 03/03/18 2122 03/04/18 0438 03/04/18 1406  BP:  137/74 (!) 159/72 (!) 156/80  Pulse: (!) 0 77 78 69  Resp:  18  16  Temp:  98 F (36.7 C) 98.4 F (36.9 C) 98.1 F (36.7 C)  TempSrc:  Oral Oral Oral  SpO2:  92% 91% 90%  Weight:      Height:        General: Not in pain or dyspnea Neurology: Awake and alert, non focal  E ENT: no pallor, no icterus, oral mucosa moist Cardiovascular: No JVD. S1-S2 present, rhythmic, no gallops, rubs, or murmurs. No lower extremity edema. Pulmonary: vesicular breath sounds bilaterally, adequate air movement, no wheezing, rhonchi or rales. Gastrointestinal. Abdomen flat, no organomegaly, non tender, no rebound or guarding Skin. No rashes Musculoskeletal: no joint deformities   The results of significant diagnostics from this hospitalization (including imaging, microbiology, ancillary and laboratory) are listed below for reference.     Microbiology: Recent Results (from the past 240 hour(s))  Culture, blood (Routine x 2)     Status: None (Preliminary result)   Collection Time: 03/02/18  9:05 PM  Result Value Ref Range Status   Specimen Description   Final    BLOOD LEFT ANTECUBITAL Performed  at [facility], [address] [place], [place] [address]    Special Requests   Final    BOTTLES DRAWN AEROBIC AND ANAEROBIC Blood Culture adequate volume Performed at [facility], [address] [place], [place] [address]    Culture   Final    NO GROWTH < 12 HOURS Performed at [facility], [place] [address] [place], [place] [address]    Report Status PENDING  Incomplete  Urine culture     Status: Abnormal (Preliminary result)   Collection Time: 03/02/18 10:20 PM  Result Value Ref Range Status   Specimen Description   Final    URINE, RANDOM Performed at [facility], [address] [place], [place] [address]    Special Requests   Final    NONE Performed at [facility], [address] [place], [place] [address]    Culture 10,000 COLONIES/mL ESCHERICHIA COLI (A)  Final   Report Status PENDING  Incomplete     Labs: BNP (last 3 results) Recent Labs    03/03/18 0227  BNP 465.0*   Basic Metabolic Panel: Recent Labs  Lab 03/02/18 2113 03/03/18 0227 03/04/18 0528  NA 135 139 139  K 4.1 3.7 3.7  CL 102 105 109  CO2 22 23 20*  GLUCOSE 113* 128* 101*  BUN 28* 30* 17  CREATININE 1.52* 1.55* 1.21  CALCIUM 8.9 8.7* 8.3*   Liver Function Tests: Recent Labs  Lab 03/02/18 2113  AST 30  ALT 22  ALKPHOS 56  BILITOT 1.2  PROT 7.3  ALBUMIN 3.9   No results for input(s): LIPASE, AMYLASE in the last 168 hours. No results for input(s): AMMONIA in the last 168 hours. CBC: Recent Labs  Lab 03/02/18 2113 03/03/18 0227 03/04/18 0528  WBC 11.2* 21.2* 11.1*  NEUTROABS 11.0*  --   --   HGB 14.9 13.6 12.9*  HCT 42.9 40.7 39.6  MCV 91.5 94.2 94.5  PLT 235 228 198   Cardiac Enzymes: No results for input(s): CKTOTAL, CKMB, CKMBINDEX, TROPONINI in the last 168 hours. BNP: Invalid input(s): POCBNP CBG: No results for input(s): GLUCAP in the last 168 hours. D-Dimer No results for input(s): DDIMER in the last 72  hours. Hgb A1c No results for input(s): HGBA1C in the last 72 hours. Lipid Profile No results for input(s): CHOL, HDL, LDLCALC, TRIG, CHOLHDL, LDLDIRECT in the last 72 hours. Thyroid function studies No results for input(s): TSH, T4TOTAL, T3FREE, THYROIDAB  in the last 72 hours.  Invalid input(s): FREET3 Anemia work up No results for input(s): VITAMINB12, FOLATE, FERRITIN, TIBC, IRON, RETICCTPCT in the last 72 hours. Urinalysis    Component Value Date/Time   COLORURINE YELLOW 03/02/2018 2220   APPEARANCEUR HAZY (A) 03/02/2018 2220   LABSPEC 1.020 03/02/2018 2220   PHURINE 7.5 03/02/2018 2220   GLUCOSEU NEGATIVE 03/02/2018 2220   GLUCOSEU NEGATIVE 01/14/2011 0938   HGBUR SMALL (A) 03/02/2018 2220   BILIRUBINUR NEGATIVE 03/02/2018 2220   KETONESUR NEGATIVE 03/02/2018 2220   PROTEINUR >300 (A) 03/02/2018 2220   UROBILINOGEN 0.2 07/29/2013 1637   NITRITE POSITIVE (A) 03/02/2018 2220   LEUKOCYTESUR SMALL (A) 03/02/2018 2220   Sepsis Labs Invalid input(s): PROCALCITONIN,  WBC,  LACTICIDVEN Microbiology Recent Results (from the past 240 hour(s))  Culture, blood (Routine x 2)     Status: None (Preliminary result)   Collection Time: 03/02/18  9:05 PM  Result Value Ref Range Status   Specimen Description   Final    BLOOD LEFT ANTECUBITAL Performed at [facility], [address] [place], [place] [address]    Special Requests   Final    BOTTLES DRAWN AEROBIC AND ANAEROBIC Blood Culture adequate volume Performed at [facility] [address] [place], [place] [address]    Culture   Final    NO GROWTH < 12 HOURS Performed at [facility], [place] [address], [place], [place] [address]    Report Status PENDING  Incomplete  Urine culture     Status: Abnormal (Preliminary result)   Collection Time: 03/02/18 10:20 PM  Result Value Ref Range Status   Specimen Description   Final    URINE, RANDOM Performed at [facility], [address]  [place], [place] [address]    Special Requests   Final    NONE Performed at [facility], [address] [place], [place] [address]    Culture 10,000 COLONIES/mL ESCHERICHIA COLI (A)  Final   Report Status PENDING  Incomplete     Time coordinating discharge: 45 minutes  SIGNED:   Tawni Millers, MD  [facility] 03/04/2018, 2:40 PM Pager [phone]  If 7PM-7AM, please contact night-coverage [email or web address] Password [email or web address]

## 2018-03-04 NOTE — Progress Notes (Signed)
PROGRESS NOTE    Jeremy [name]  WNU:[id number] DOB: [birth year] DOA: 03/02/2018 PCP: Noralee Space, MD   Brief Narrative:  Jeremy [name] is a 77 year old male who presents with fever and chills, past medical history significant for bladder cancer (s/p excision in 2015, no chemotherapy/radiation), self catheterization, recurrent UTI, BPH, CKD stage II, hypertension. Reports developing acute onset fever/chills, and discomfort during self-cath, which began on 4/21. Denies hematuria, flank pain, nausea, vomiting, diarrhea, abdominal pain, headache, confusion. No chest pain, cough, shortness of breath. Reports drinking 2 glasses of wine daily. He presented to urgent care on 4/22 and was diagnosed with UTI and given IM Rocephin, was then sent to [facility] to rule out sepsis. Upon presentation was noted to have a temperature of 101.9, tachycardia, tachypnea, sodium 139, potassium 3.7, BUN 30, calcium 8.7, creatinine 1.55 (baseline 1.2), GFR 41, WBC 21.2, lactic acid 1.4. BNP 135.4, Procalcitonin 37.73, UA positive for UTI, CXR negative.  Patient was admitted with a working diagnosis of sepsis secondary to recurrent UTI.  Assessment & Plan:   Principal Problem:   UTI (urinary tract infection) Active Problems:   Malignant neoplasm of bladder (HCC)   Essential hypertension   Benign prostatic hyperplasia with urinary obstruction   Sepsis ([place])   Acute renal failure superimposed on stage 2 chronic kidney disease (HCC)   Alcohol use  Sepsis secondary to recurrent UTI - Improved -Today, WBC 11.1, Creatinine 1.21, BUN 17, GFR 56. -Urine culture positive for E.coli, blood cultures pending. -Continue Rocephin IV, continue Zofran for nausea.   Malignant neoplasm of bladder -Status post excision in 2015. -Follow up as outpatient.  Acute kidney injury superimposed on CKD stage II - Improved -Creatinine at baseline of 1.2, down from 1.6 on admission. -FENa <1%. -Continue IV  fluids.  BPH -Continue Flomax.  Essential hypertension -Holding Lisinopril for now. -Continue Amlodipine. -Continue Hydralazine 5mg  injection if SBP >175.  Alcohol use -Continue CIWA, continue Ativan protocol. -Continue Thiamine.  DVT prophylaxis: Lovenox. Code Status: FULL. Family Communication: None at bedside. Disposition Plan: Home when clinically improved.   Consultants:   None.  Procedures:   None.  Antimicrobials:   Rocephin 4/22-present.   Subjective: Patient is awake, alert and oriented x 3. Sitting on the side of the bed, interactive. Denies fever, chills, nausea, vomiting, diarrhea, abdominal pain, back pain, headache, confusion, dysuria, hematuria. Denies tremors. Eager to be discharged home.  Objective: Vitals:   03/03/18 1345 03/03/18 1800 03/03/18 2122 03/04/18 0438  BP: (!) 143/65  137/74 (!) 159/72  Pulse: 61 (!) 0 77 78  Resp:   18   Temp: 98.7 F (37.1 C)  98 F (36.7 C) 98.4 F (36.9 C)  TempSrc: Oral  Oral Oral  SpO2: 93%  92% 91%  Weight:      Height:        Intake/Output Summary (Last 24 hours) at 03/04/2018 1129 Last data filed at 03/04/2018 0600 Gross per 24 hour  Intake 2320 ml  Output 2300 ml  Net 20 ml   Filed Weights   03/02/18 2053 03/03/18 0143  Weight: 84.8 kg (187 lb) 86.1 kg (189 lb 13.1 oz)    Examination:  General exam: Appears calm and comfortable  Respiratory system: Clear to auscultation. Respiratory effort normal. Cardiovascular system: S1 & S2 heard, RRR. No murmurs, rubs, gallops or clicks. No pedal edema. Gastrointestinal system: Abdomen is nondistended, soft and nontender.  Normal bowel sounds heard. Central nervous system: Alert and oriented.  No focal neurological deficits. Extremities: Moves all four extremities, ambulatory. Skin: No rashes, lesions or ulcers Psychiatry: Judgement and insight appear normal. Mood & affect appropriate.    Data Reviewed: I have personally reviewed following labs and  imaging studies  CBC: Recent Labs  Lab 03/02/18 2113 03/03/18 0227 03/04/18 0528  WBC 11.2* 21.2* 11.1*  NEUTROABS 11.0*  --   --   HGB 14.9 13.6 12.9*  HCT 42.9 40.7 39.6  MCV 91.5 94.2 94.5  PLT 235 228 716   Basic Metabolic Panel: Recent Labs  Lab 03/02/18 2113 03/03/18 0227 03/04/18 0528  NA 135 139 139  K 4.1 3.7 3.7  CL 102 105 109  CO2 22 23 20*  GLUCOSE 113* 128* 101*  BUN 28* 30* 17  CREATININE 1.52* 1.55* 1.21  CALCIUM 8.9 8.7* 8.3*   GFR: Estimated Creatinine Clearance: 54.5 mL/min (by C-G formula based on SCr of 1.21 mg/dL). Liver Function Tests: Recent Labs  Lab 03/02/18 2113  AST 30  ALT 22  ALKPHOS 56  BILITOT 1.2  PROT 7.3  ALBUMIN 3.9   No results for input(s): LIPASE, AMYLASE in the last 168 hours. No results for input(s): AMMONIA in the last 168 hours. Coagulation Profile: Recent Labs  Lab 03/02/18 2113  INR 1.09   Cardiac Enzymes: No results for input(s): CKTOTAL, CKMB, CKMBINDEX, TROPONINI in the last 168 hours. BNP (last 3 results) No results for input(s): PROBNP in the last 8760 hours. HbA1C: No results for input(s): HGBA1C in the last 72 hours. CBG: No results for input(s): GLUCAP in the last 168 hours. Lipid Profile: No results for input(s): CHOL, HDL, LDLCALC, TRIG, CHOLHDL, LDLDIRECT in the last 72 hours. Thyroid Function Tests: No results for input(s): TSH, T4TOTAL, FREET4, T3FREE, THYROIDAB in the last 72 hours. Anemia Panel: No results for input(s): VITAMINB12, FOLATE, FERRITIN, TIBC, IRON, RETICCTPCT in the last 72 hours. Sepsis Labs: Recent Labs  Lab 03/02/18 2116 03/02/18 2322 03/03/18 0227 03/03/18 0527  PROCALCITON  --   --  37.73  --   LATICACIDVEN 1.07 0.80  --  1.4    Recent Results (from the past 240 hour(s))  Culture, blood (Routine x 2)     Status: None (Preliminary result)   Collection Time: 03/02/18  9:05 PM  Result Value Ref Range Status   Specimen Description   Final    BLOOD LEFT  ANTECUBITAL Performed at [facility], [address] [place], [place] [address]    Special Requests   Final    BOTTLES DRAWN AEROBIC AND ANAEROBIC Blood Culture adequate volume Performed at [facility], [address] [place], [place] [address]    Culture   Final    NO GROWTH < 12 HOURS Performed at [facility], [place] [address] [place], [place] [address]    Report Status PENDING  Incomplete  Urine culture     Status: Abnormal (Preliminary result)   Collection Time: 03/02/18 10:20 PM  Result Value Ref Range Status   Specimen Description   Final    URINE, RANDOM Performed at [facility], [address] [place], [place] [address]    Special Requests   Final    NONE Performed at [facility], [address] [place], [place] [address]    Culture 10,000 COLONIES/mL ESCHERICHIA COLI (A)  Final   Report Status PENDING  Incomplete         Radiology Studies: Dg Chest  2 View  Result Date: 03/02/2018 CLINICAL DATA:  Acute onset of chills.  Fever. EXAM: CHEST - 2 VIEW COMPARISON:  Chest radiograph performed 03/11/2017 FINDINGS: The lungs are well-aerated. Pulmonary vascularity is at the upper limits of normal. Minimal bibasilar atelectasis is noted. There is no evidence of pleural effusion or pneumothorax. The heart is normal in size; the mediastinal contour is within normal limits. No acute osseous abnormalities are seen. IMPRESSION: Minimal bibasilar atelectasis noted.  Lungs otherwise clear. Electronically Signed   By: Garald Balding M.D.   On: 03/02/2018 21:48        Scheduled Meds: . amLODipine  10 mg Oral Daily  . aspirin  81 mg Oral Daily  . atorvastatin  20 mg Oral Daily  . B-complex with vitamin C  1 tablet Oral Daily  . enoxaparin (LOVENOX) injection  40 mg Subcutaneous Q24H  . folic acid  1 mg Oral Daily  . LORazepam  0-4 mg Intravenous Q6H   Followed by  . [START ON 03/05/2018] LORazepam  0-4 mg Intravenous Q12H   . multivitamin with minerals  1 tablet Oral Daily  . multivitamin with minerals  1 tablet Oral Daily  . tamsulosin  0.4 mg Oral QPC supper  . thiamine  100 mg Oral Daily   Or  . thiamine  100 mg Intravenous Daily   Continuous Infusions: . sodium chloride 100 mL/hr at 03/04/18 0539  . cefTRIAXone (ROCEPHIN)  IV Stopped (03/04/18 0326)     LOS: 1 day    Time spent: 25 minutes.   Eloy End, PA-S [facility] Pager [phone]  If 7PM-7AM, please contact night-coverage [email or web address] Password [facility] 03/04/2018, 11:29 AM

## 2018-03-05 LAB — URINE CULTURE

## 2018-03-06 DIAGNOSIS — R3914 Feeling of incomplete bladder emptying: Secondary | ICD-10-CM | POA: Diagnosis not present

## 2018-03-06 DIAGNOSIS — C678 Malignant neoplasm of overlapping sites of bladder: Secondary | ICD-10-CM | POA: Diagnosis not present

## 2018-03-06 DIAGNOSIS — N401 Enlarged prostate with lower urinary tract symptoms: Secondary | ICD-10-CM | POA: Diagnosis not present

## 2018-03-07 LAB — CULTURE, BLOOD (ROUTINE X 2)
Culture: NO GROWTH
Special Requests: ADEQUATE

## 2018-03-08 LAB — CULTURE, BLOOD (ROUTINE X 2)
CULTURE: NO GROWTH
SPECIAL REQUESTS: ADEQUATE

## 2018-03-17 ENCOUNTER — Encounter: Payer: Self-pay | Admitting: Pulmonary Disease

## 2018-03-17 ENCOUNTER — Other Ambulatory Visit (INDEPENDENT_AMBULATORY_CARE_PROVIDER_SITE_OTHER): Payer: Medicare Other

## 2018-03-17 ENCOUNTER — Ambulatory Visit (INDEPENDENT_AMBULATORY_CARE_PROVIDER_SITE_OTHER): Payer: Medicare Other | Admitting: Pulmonary Disease

## 2018-03-17 VITALS — BP 128/62 | HR 70 | Temp 97.8°F | Ht 70.0 in | Wt 182.0 lb

## 2018-03-17 DIAGNOSIS — H9193 Unspecified hearing loss, bilateral: Secondary | ICD-10-CM

## 2018-03-17 DIAGNOSIS — E559 Vitamin D deficiency, unspecified: Secondary | ICD-10-CM | POA: Diagnosis not present

## 2018-03-17 DIAGNOSIS — I872 Venous insufficiency (chronic) (peripheral): Secondary | ICD-10-CM | POA: Diagnosis not present

## 2018-03-17 DIAGNOSIS — G4733 Obstructive sleep apnea (adult) (pediatric): Secondary | ICD-10-CM

## 2018-03-17 DIAGNOSIS — N401 Enlarged prostate with lower urinary tract symptoms: Secondary | ICD-10-CM

## 2018-03-17 DIAGNOSIS — E7800 Pure hypercholesterolemia, unspecified: Secondary | ICD-10-CM | POA: Diagnosis not present

## 2018-03-17 DIAGNOSIS — I10 Essential (primary) hypertension: Secondary | ICD-10-CM | POA: Diagnosis not present

## 2018-03-17 DIAGNOSIS — F17210 Nicotine dependence, cigarettes, uncomplicated: Secondary | ICD-10-CM | POA: Diagnosis not present

## 2018-03-17 DIAGNOSIS — R0902 Hypoxemia: Secondary | ICD-10-CM | POA: Diagnosis not present

## 2018-03-17 DIAGNOSIS — J411 Mucopurulent chronic bronchitis: Secondary | ICD-10-CM

## 2018-03-17 DIAGNOSIS — N138 Other obstructive and reflux uropathy: Secondary | ICD-10-CM

## 2018-03-17 LAB — CBC WITH DIFFERENTIAL/PLATELET
BASOS PCT: 0.9 % (ref 0.0–3.0)
Basophils Absolute: 0.1 10*3/uL (ref 0.0–0.1)
EOS PCT: 1.4 % (ref 0.0–5.0)
Eosinophils Absolute: 0.1 10*3/uL (ref 0.0–0.7)
HEMATOCRIT: 44.9 % (ref 39.0–52.0)
HEMOGLOBIN: 15.4 g/dL (ref 13.0–17.0)
Lymphocytes Relative: 31.6 % (ref 12.0–46.0)
Lymphs Abs: 2.4 10*3/uL (ref 0.7–4.0)
MCHC: 34.3 g/dL (ref 30.0–36.0)
MCV: 92.2 fl (ref 78.0–100.0)
Monocytes Absolute: 0.6 10*3/uL (ref 0.1–1.0)
Monocytes Relative: 7.5 % (ref 3.0–12.0)
NEUTROS ABS: 4.4 10*3/uL (ref 1.4–7.7)
Neutrophils Relative %: 58.6 % (ref 43.0–77.0)
Platelets: 353 10*3/uL (ref 150.0–400.0)
RBC: 4.87 Mil/uL (ref 4.22–5.81)
RDW: 14.7 % (ref 11.5–15.5)
WBC: 7.5 10*3/uL (ref 4.0–10.5)

## 2018-03-17 LAB — PSA: PSA: 5.88 ng/mL — AB (ref 0.10–4.00)

## 2018-03-17 LAB — COMPREHENSIVE METABOLIC PANEL
ALT: 15 U/L (ref 0–53)
AST: 13 U/L (ref 0–37)
Albumin: 4.2 g/dL (ref 3.5–5.2)
Alkaline Phosphatase: 59 U/L (ref 39–117)
BUN: 23 mg/dL (ref 6–23)
CHLORIDE: 100 meq/L (ref 96–112)
CO2: 31 meq/L (ref 19–32)
Calcium: 9.6 mg/dL (ref 8.4–10.5)
Creatinine, Ser: 1.22 mg/dL (ref 0.40–1.50)
GFR: 61.09 mL/min (ref >60.00)
GLUCOSE: 83 mg/dL (ref 70–99)
POTASSIUM: 4.4 meq/L (ref 3.5–5.1)
SODIUM: 139 meq/L (ref 135–145)
Total Bilirubin: 1 mg/dL (ref 0.2–1.2)
Total Protein: 7.4 g/dL (ref 6.0–8.3)

## 2018-03-17 LAB — LIPID PANEL
Cholesterol: 155 mg/dL (ref 0–200)
HDL: 58.2 mg/dL (ref >39.00)
LDL CALC: 79 mg/dL (ref 0–99)
NONHDL: 96.53
Total CHOL/HDL Ratio: 3
Triglycerides: 86 mg/dL (ref 0.0–149.0)
VLDL: 17.2 mg/dL (ref 0.0–40.0)

## 2018-03-17 LAB — TSH: TSH: 0.84 u[IU]/mL (ref 0.35–4.50)

## 2018-03-17 NOTE — Progress Notes (Addendum)
Subjective:     Patient ID: NIZAR CUTLER, male   DOB: May 14, 1941, 77 y.o.   MRN: [id number]  HPI 77 y/o WM here for a follow up visit... he has multiple medical problems as noted below...     Review of old paper chart> old PFT 2006 showed FVC=5.07, FEV1=3.86, %1sec=76% but no predicted values reported on that tracing...  CXR 3/13 showed normal heart size, COPD w/ hyperinflation, clear/ NAD.Marland KitchenMarland Kitchen  EKG 3/13 showed NSR, rate66, ?LAE, otherw WNL.Marland KitchenMarland Kitchen  LABS 3/13:  FLP- at goals on Simva40;  Chems- wnl w/ BS110;  CBC- wnl;  TSH=0.88;  PSA=3.87  ~  January 25, 2013:  Yearly ROV & Eligio has had a good yr except for some recurrent UTIs which prompted a Urology f/u w/ DrOttelin & Borden> eval revealed worsening BPH/ LUTS and PVR>600cc; started on Proscar & Flomax w/ improvement (voiding better, decr nocturia, stream improved)... We reviewed the following medical problems during today's office visit >>     Ex-cig smoker> he quit regular cigs >79yr ago & uses occas E-cig/ cigar; denies cough, sputum, SOB, & he remains active w/ walking & golfs regularly...    HBP> on MetopXL50, Norvasc10, Lisinopril20; BP= 128/68 & similar at home; denies CP/angina, palpit, SOB or edema...    Cholesterol> on Simva40; FLP 3/14 shows TChol 143, TG 44, HDL 57, LDL 78; continue same...    GI (divertics, polyps,hems) stable & f/u colon done 11/13 by DrDBrodie- mod divertics, 2 sessile polyps w/ path revealing benign mucosa & f/u rec 154yr    GU- hx of Bladder Ca, BPH, UTIs, ED>  followed by DrOttelin/Borden & seen 1/14 w/ recurrent UTIs, signif PVR>600, & treated w/ Proscar5 & Flomax0.4 w/ sigif improvement in symptoms; cysto was otherw neg & PSA= 4.46 w/ 30%free...    Hx DJD & Vit D defic> followed by DrAlusio & he's taking 1000u VitD supplement... We reviewed prob list, meds, xrays and labs> see below for updates >>   CXR 3/14 showed norm heart size, clear lungs w/ min basilar scarring, NAD...  LABS 3/14:  FLP- at goals on  Simva40 (contin same);  Chems- ok x BS=128 (needs better diet to avoid DM meds);  CBC- wnl;  TSH=0.75...   ~  September 21, 2013:  49m19moV & post hosp check> [facility] 9/18-19 w/ "indigestion" pain is epig/ lower chest wall w/ radiation to back; no assoc n/v, no SOB or palpit etc; this was followed by chills & sweat prompting call to EMS & taken to ER; the discomfort resolved w/ time & PPI rx;  Eval revealed a neg exam, clear CXR x bibasilar atx, norm EKG, neg cardiac enz, 2DEcho wnl x gr1DD;  He took the PPI for awhile then stopped on his own w/o recurrent symptoms- denies abd pain, dysphagia, reflux, n/v, c/d, etc;  After disch a urine cult ret pos for >100K Enterococcus Faecalis, sens Levaquin & we treated him x7d for this prob;  He has had f/u DrBorden, Urology & he reports difficulty emptying his bladder & meds adjusted- Finasteride5 + Tamsulosin0.4 and improving...   He is [facility]...     We reviewed the following medical problems during today's office visit >> he had the 2014 Flu vaccine 9/14  EKG 9/14 showed NSR, rate60, WNL, NAD...  2DEcho 9/14 showed norm LV size & function w/ EF=55-60%, norm wall motion, Gr1DD, mild LA&RA dil, valves ok, RV ok...  ~  January 26, 2014:  60moROV & here for his annnual check-up> JKirtisreports doing satis, [place] medically... [facility] We reviewed the following medical problems during today's office visit >>     Ex-cig smoker> he quit regular cigs >248yrago & uses occas E-cig/ cigar; denies cough, sputum, SOB, & he remains active w/ walking & golfs regularly...    HBP> on MetopXL50, Norvasc10, Lisinopril20; BP= 110/68 & similar at home; denies CP/angina, palpit, SOB or edema...    Cholesterol> on Simva40; FLP 3/15 shows TChol 173, TG 103, HDL 56, LDL 97; continue same...    GI (divertics, polyps,hems) stable & f/u colon done 11/13 by  DrDBrodie- mod divertics, 2 sessile polyps w/ path revealing benign mucosa & f/u rec 1041yr   GU- hx of Bladder Ca, BPH, UTIs, ED>  followed by DrOttelin/Borden & seen 1/14 w/ recurrent UTIs, signif PVR>600, & treated w/ Proscar5 & Flomax0.4 w/ sigif improvement in symptoms; cysto was otherw neg & PSA= 4.46 w/ 30%free...    Hx DJD & Vit D defic> followed by DrAlusio & he's taking 1000u VitD supplement... We reviewed prob list, meds, xrays and labs> see below for updates >> Given Prevnar-13 today & Rx for Shingles shot...  LABS 3/15:  FLP- at goals on Simva40;  Chems- wnl;  CBC- wnl;  TSH=0.86...  ~  March 08, 2015:  71m70mo & JohnChieforts doing satis & no new complaints or concerns; [place]- he knows to avoid the sun, use sunscreen & he gets checke regularly by Derm-DrGruber;  He also gets checked Q6mo 4morology-DrBorden & was seen 9/15 (note reviewed) and recently 4/16 (note pending)- he tells me that PSA was good & cysto was neg, they were concerned that he wasn't emptying his bladder well 7 increased his Flomax to 0.8;  Unfortunately Manley Harountill smoking some- he's decr to 1/4ppd + Ecig & I implored him to quit completely... We reviewed the following medical problems during today's office visit >>     cig smoker> he has returned to smoking- now 1/4ppd + Ecig; he knows the dangers and risks!  He notes min cough & clear sputum, no hemoptysis, & denies SOB (remains active w/ walking & golfs regularly)...    HBP> on ASA81, MetopXL50, Norvasc10, Lisinopril20; BP= 140/80 & better at home; denies CP/angina, palpit, SOB or edema...    Cholesterol> on Simva40; FLP 4/16 shows TChol 165, TG 80, HDL 60, LDL 89; continue same...    GI- divertics, polyps,hems> stable & f/u colon done 11/13 by DrDBrodie- mod divertics, 2 sessile polyps w/ path revealing benign mucosa & f/u rec 54yrs2yrGU- hx of Bladder Ca, BPH, UTIs, ED>  followed by DrBorden & seen Q6mo 9/52mo  4/16- note pending, pt reports PSA was good & Cysto- NEG; on Proscar5 & Flomax incr to 2 tabs daily for better emptying... Note- Cr=1.56, copy to DrBorden, avoid NSAIDs, incr water intake...    Hx DJD & Vit D defic> followed by DrAlusio & he's taking 1000u VitD supplement... We reviewed prob list, meds, xrays and labs> see below for updates >>   CXR 4/16 showed norm heart size, clear lungs w/ mild bibasilar atx/ scarring, DJD Tspine, NAD...  LABS 4/16:  FLP- all at goals on Simva40;  Chems- ok x Cr=1.56;  CBC- wnl;  TSH=0.68...  ~  March 07, 2016:  33[place]RCrescent Cityreports a good yr, doing well w/o new complaints or concerns; however his wife is c/osleep apnea that she has witnessed & we will arrange for a home sleep test; he denies daytime sleepiness issues and wakes refreshed, denies sleep pressure during the day, need to nap, any prob w/ driving etc- EYFV=4/94.. We reviewed the following medical problems during today's office visit >>     R/O OSA>  We will sched a home sleep study for screening purposes...    Cig smoker> he continues to smoke cigs; he knows the dangers and risks!  He notes min cough & clear sputum, no hemoptysis, & denies SOB (remains active w/ walking & golfs regularly)...    HBP> on ASA81, MetopXL50, Norvasc10, Lisinopril20; BP= 138/80 & similar at home; denies CP/angina, palpit, SOB or edema...    Cholesterol> on Simva40; FMowrystown4/17 shows TChol 136, TG 67, HDL 60, LDL 63; continue same...    GI- divertics, polyps,hems> stable & f/u colon done 11/13 by DrDBrodie- mod divertics, 2 sessile polyps w/ path revealing benign mucosa & f/u rec 1672yr    GU- hx of Bladder Ca, BPH, UTIs, ED>  followed by DrBorden & seen Q6m11mo/16 note reviewed & he has f/u tomorrow, pt reports PSA was good & Cysto- NEG; on Proscar5 & Flomax incr to 2 tabs daily for better emptying (PSA & cysto due now)...     Hx DJD, sp stenosis w/ bilat leg weakness, & Vit D defic> followed by DrAlusio & Neuro- DrPatel rec  phys therapy/ rehab- pt notes that 2 Super-B vits and 2 VitD pills are really helping his legs! Pain resolved and aching returns off these supplements. EXAM shows Afeb, VSS, O2sat=94% on RA;  HEENT- neg, mallamapti1, sl raspy voice;  Chest- clear w/o w/r/r;  Heart- RR w/o m/r/g;  Abd- soft, nontender, neg;  Ext- neg w/o c/c/e;  Neuro- intact...   CXR 03/07/16>  Norm heart size, Ao atherosclerosis, sl overinflation & flat diaphragms, clear lungs, degen changes in Tspine...  EKG 03/07/16>  NSR, rate66, wnl/ NAD...  LABS 03/07/16>  FLP- at goals on Simva40;  Chems- ok x FBS=110, BUN=24, Cr=1.26 (GFR60);  CBC- wnl;  TSH=0.94;  VitD=45 IMP/PLAN>>  We will arrange for a home sleep study;  Must quit all smoking;  Continue current meds...  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 ADDENDUM>>  CPAP titration study done 04/30/16 & read by DrSood 05/23/16>  Optimal CPAP = 11cmH2O w/ AHI=3.3 on CPAP, min O2sat on CPAP= 86%, EKG showed NSR w/ PVCs, PLMS=125 w/ Index=25.6/Hr & we will assess pt for RLS...  ~  Mar 11, 2017:  71yr 49yr& medical follow up>  [facility] ([place] Interlakene tells me that he hasn't even used it for the last several months;  He says he has a new bed, wife tells him he is much quieter sleeping now & not waking her up, says he wakes refreshed now, no naps, and no daytime hypersomnolence;  He reports that he sleeps from 10P to 6A, one arousal & able to get back to sleep ok; In addition he has lost 17# on diet; we did an online CPAP  download today- last avail was 10/17 - 09/25/16 w/ poor compliance (17/30d, all <4H/night) and not effective; We discussed all these issues and decided to proceed w/ a Home Sleep Test to re-assess his current needs... we  reviewed the following medical problems during today's office visit >>     OSA, nocturnal hypoxemia, RLS>  We will sched another home sleep study in light of his prev difficulty, decr in symptoms, and wt reduction...    Cig smoker> he continues to smoke cigs- prev 4/d + vape, says none for 2wks; he knows the dangers and risks!  He notes min cough & clear sputum, no hemoptysis, & denies SOB (remains active w/ walking & golfs regularly)...    HBP> on ASA81, MetopXL50, Norvasc10, Lisinopril20; BP= 122/80 & similar at home; denies CP/angina, palpit, SOB or edema; we decided to switch Lisin20=> Losartan100/d...    Cholesterol> on Simva40; FLP 5/18 shows TChol 151, TG 43, HDL 88, LDL 54; continue same...    GI- divertics, polyps,hems> stable & f/u colon done 11/13 by DrDBrodie- mod divertics, 2 sessile polyps w/ path revealing benign mucosa & f/u rec 39yr.    GU- hx of Bladder Ca, BPH, UTIs, ED>  followed by DrBorden & seen Q679molast seen 12/04/16 & note reviewed; now voids occas & self-caths Bid, on Tamsulosin-2Qhs & off Finasteride; cysto showed severe prostatic hypertrophy w/ obstuction; no lesions seen...    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 Pain resolved and aching returns off these supplements.    [facility] pt c/o right buttock & hip pain radiating down RLE but no LBP- Dx w/ lumbar radiculopathy & treated w/ lumbar trigger pt injection;  EXAM shows Afeb, VSS, O2sat=94% on RA;  HEENT- neg, mallamapti1, sl raspy voice;  Chest- clear w/o w/r/r;  Heart- RR w/o m/r/g;  Abd- soft, nontender, neg;  Ext- neg w/o c/c/e;  Neuro- intact...   CXR 03/11/17 (independently reviewed by me in the PACS system) shows norm heart size, mild hyperinflation, clear lungs- NAD...  LABS 03/11/17>  FLP- all parameters  at goals on Simva40;  Chems- ok w/ BS=103, Cr=1.18, LFTs wnl;  CBC- wnl w/ Hg=16.4;  TSH=0.55;  VitD=55 on 1000u/d... IMP/PLAN>>  Burton's CXR & labs are OK- continue same meds;  He will also continue f/u w/ his Urologist for bladder issues and Neurologist for leg pain;  His OSA/ CPAP status is all mixed up- suggest new home sleep test in light of his prev results, decr in observed signs, and recent wt loss...   ADDENDUM>>  Home Sleep Test done 04/02/17 (6.5H study) & showed AHI=25/hr, mixed and assoc w/ numerous desaturations- ave=86% and nadir O2sat=74%... Recall that prev Home Sleep Test 03/21/16 showed AHI=43/hr and O2desat to 78%; he then had an in-lab CPAP Titration test 04/30/16 showing optimal CPAP = 11cmH2O pressure w/ AHI=3.3/hr & min O2sat=86%... Jamol tells me that he worked diligently w/ AHButtso find the proper mask fit for him- unable to use nasal pillows or nasal mask due to him being a mouth breather; tried all the diff face masks but found that his mouth/throat were very dry despite 100% humidity & he was awakening Q3067mw/ the discomfort;  His only CPAP download 08/2016 showed poor compliance & no benefit; he stopped the CPAP  on his own & did not ret for f/u until his 2018 physical 03/11/17 w/ suggestion to repeat his sleep test since he felt that he was resting much better after wt loss & new bed... Since the recent sleep test showed mod mixed apneas w/ desat to 74% I have suggested Sleep consult w/ DrSood to see if he has any additional suggestions/ tricks/ etc    ~  Mar 17, 2018:  2yrROV & general medical follow up visit>  JGeradohas had a difficult yr w/ various med[facility]surg [facility]  He is still smoking 1/2-1ppd + vaping JUUL but says his breathing is fine- wants to try Chantix & we will gladly write for this;   We reviewed the following interval Epic notes/ visits from this past yr>      He saw [facility]-Kallie Edward6/1/18>  Repeat Home Sleep Study 04/02/17  confirmed OSA w/ AHI=25 + O2desat on RA to nadir 74% and ave 86% despite what he felt was signif improvement clinically; He was sent for mask desensitization w/ VLynnae Sandhoffat [facility] but pt states no benefit, unable to tol any avail apparatus;  He was then referred to Dentist for an oral appliance => he cannot remember the dentist's name but was rec to try an OTC appliance [place]doesn't know the name, didn't call DrAlva or bring it to the office to review) but he reports that he likes it, using it for the past 460mo sleeping well he says...    He saw NS- DrElsner w/ surg to decompress mult lumbar nerve roots 08/25/17>  [place] had [place], [place], & finally had MRI showing stenosis at L3-4, 4-5, 5-S1, plus mod severe central canal stenosis;  Pt reports surgery was an amazing success & he is much improved...     He is also under the care of Ophthalmology- DrShapiro, DrYeatts, DrRankin w/ early stage macular degen>  He's had bilat blepharoplasty upper lids, ectropion surg both eyes (2015), s/p bilat cat surg w/ lens implants (2005), YAG capsulotomy (2018).    [facility] after presenting w/ fever/ chills, hx bladder cancer, CKD, & requires self caths => UTI w/ low colony count EColi on Urine cult, blood cut's neg, treated w/ Rocephin=> KEOak Springshowed Cr=1.55=>1.21, WBC=21K=>11K...    He saw UROLOGY DrBorden 03/06/18>  UTI treated w/ Keflex & improved; self cath 2-3 times daily on Flomax0.8/d; allergic to PCN & Sulfa; hx bladder cancer (since 1999), elev PSA, incomplete emptying, BPH w/ LUTS; after urodynamics he was offered poss bladder outlet surg options but ultimately chose to continue self caths for now...  We reviewed the following medical problems during today's office visit>      OSA, nocturnal hypoxemia, RLS>  SEE ABOVE - he is currently using a nasal/oral appliance that he  purchased via [place] is resting well, doing satis w/ this device (he will call me w/ the name of the device & f/u w/ sleep med- DrAlva; HE NEEDS AN ONO ON THIS NEW DEVICE & we will sched this...     Cig smoker> he continues to smoke cigs- 1/2-1ppd + vape; he knows the dangers and risks!  He notes min cough & clear sputum, no hemoptysis, & denies SOB (remains active w/ walking & golfs regularly by his hx).  HBP> on ASA81, MetopXL50, Norvasc10, Losar100; BP= 128/62 & similar at home; denies CP/angina, palpit, dizzy, SOB or edema...    Cholesterol> on Simva40; FLP 5/19 shows TChol 155, TG 86, HDL 58, LDL 79; continue same + diet...    GI- divertics, polyps, hems> stable & f/u colon done 11/13 by DrDBrodie- mod divertics, 2 sessile polyps w/ path revealing benign mucosa & f/u rec 64yr.    GU- hx of Bladder Ca, BPH, incomplete emptying, UTIs, ED>  followed by DrBorden & seen Q672molast seen 03/06/18 & note reviewed; self-caths, on Tamsulosin-2Qhs & off Finasteride; cysto showed severe prostatic hypertrophy w/ obstuction; no lesions seen in bladder; offered outlet obstruction surg but he prefers self-caths at this point...    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 Pain resolved and aching returns off these supplements => much improved after surg by DrElsner 10/18.    [facility]> pt c/o right buttock & hip pain radiating down RLE but no LBP- Dx w/ lumbar radiculopathy & treated w/ lumbar trigger pt injection; ESI shots, etc; he finally had 2nd opinion consult w/ DrElsner/ MRI/ and lumbar decompression surg => now much improved... EXAM shows Afeb, VSS, O2sat=94% on RA;  HEENT- neg, mallamapti1, worsening raspy voice;  Chest- clear w/o w/r/r;   Heart- RR w/o m/r/g;  Abd- soft, nontender, neg;  Ext- neg w/o c/c/e;  Neuro- intact...   CXR 03/02/18 showed norm heart size, min bibasilar atx, otherw clear lungs- NAD...Marland KitchenMarland KitchenEKG 03/03/18 showed NSR, rate75, wnl, NAD...  LABS 03/17/18>  FLP- all parameters near goals on Simva40;  Chems- wnl w/ BS=83, Cr=1.22, LFTs wnl;  CBC- wnl w/ Hg=15.4;  TSH=0.84;  PSA=5.88;  VitD=35 & rec to take 1-2000u Vit d daily... IMP/PLAN>>  JoNthonyas had a very busy & difficult year medically- He is still smoking cigarettes and admits to <1ppd + vaping juul; he understands the risks w/ COPD, atherosclerosis, & potential cancers in throat/ lungs/ & his bladder cancer; he has asked to try Chantix & Rx written today...     He has OSA w/ nocturnal hypoxemia but has been intol to the CPAP interface=- he saw DrAlva for a sleep consult & had another home sleep test 03/2017 => [facility] & still could not tolerate the mask options (he says due to it all being too drying for his throat despite max humidification); he went to see a dentist w/ sleep credentials and rather than try an oral applliance they rec a "nasal apparatus that tapes the mouth shut" (p[place]ased from [place]); he's been using this device for the last few months & has f/u appt w/ DrAlva scheduled- I rec to pt that we proceed w/ an ONO while wearing this apparatus to see if he is still getting hypoxemic => in which case he will need nocturnal O2...    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    He has been under the care of Urology-DrBorden  for recurrent bladder cancer, BPH and incomplete emptying; surveillance cystoscopies w/o recurrent tumor but pt is dependent on self-cath tid for his  bladder emptying & gets occas UTIs; he was [facility] w/ complic UTI & treated w/ IV antibiotics and improved back to baseline...     He has an increasingly gravelly voice and complains of hearing loss/ cerumen=> we discussed referral to ENT to check his larynx (pt is a smoker) and his hearing...            PROBLEM LIST:    OSA >> Wife noted nocturnal apneas and pt mentioned this to me 02/2016 physical;  Home sleep test w/ severe OSA AHI=43.1/hr & lowest O2sat=78%;  We will order ASAP CPAP titration in the sleep lab for this & to check to see if he need nocturnal O2...  ~  6/17>  CPAP titration study done 04/30/16 & read by DrSood 05/23/16>  Optimal CPAP = 11cmH2O w/ AHI=3.3 on CPAP, min O2sat on CPAP= 86%, EKG showed NSR w/ PVCs, PLMS=125 w/ Index=25.6/Hr & we will assess pt for RLS => he never returned to see me after starting on CPAP... ~  5/18>  He indicates that he stopped CPAP on his own ?due to difficulty w/ the mask etc; states he has a new bed, wife says much quieter & not waking her up, pt says wakes refreshed w/o daytime hypersomnolence => we discussed another Home Sleep Test to re-assess...    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 ~  3/13: he tells me he quit regular cigs >24yrago & finally quit the E-cig 680mogo; admits to an occas cigar; denies cough, sputum, SOB, & he remains active w/ walking & golf... ~  3/14: he quit regular cigs >2y16yrgo & uses occas E-cig/ cigar; denies cough, sputum, SOB, & he remains active w/ walking & golfs regularly... ~  11/14: he has fallen off the wagon & was smoking again, quit again, & now back to Ecig; advised to quit completely, reminded about his bladder cancer & I told him lung ca is a lot worse! He declines smoking cessation help... ~  3/15: he admits [place] "v[place]; again he is asked to quit completely... ~  4/16: he is back to 1/4ppd + Ecig & asked to stop  completely- he knows the risks etc... ~  4/17: he is still smoking despite all the risks...  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 uses Mucinex as needed. ~  CXR 11/09 showed mild COPD changes, NAD... Marland Kitchen  CXR 3/11 showed some scarring at the bases, no change... ~  CXR 3/12 showed chr changes, incr markings, basilar scarring, NAD. ~  CXR 3/13 showed normal heart size, COPD w/ hyperinflation, clear/ NAD... Marland Kitchen  CXR 3/14 showed norm heart size, clear lungs w/ min basilar scarring, NAD. ~  CXR 9/14 (hosp for atypCP, reflux & UTI) showed norm heart size, bibasilar atx, otherw clear, NAD... Marland Kitchen  CXR 4/16 showed norm heart size, clear lungs w/ mild bibasilar atx/ scarring, DJD Tspine, NAD ~  CXR 4/17 showed norm heart size, Ao atherosclerosis, sl overinflation & flat diaphragms, clear lungs, degen changes in Tspine.  HYPERTENSION (ICD-401.9) - on METOPROLOL XR 38m43m  AMLODIPINE 10mg34m LISINOPRIL 20mg/42m  he also takes ASA 81mg/d81mhe has seen DrWall ([place]) in the past... BP= 134/60 & similar at home, takes meds regularly & tolerates well...Marland KitchenMarland Kitchen  denies HA, fatigue, visual changes, CP, palipit, dizziness, syncope, dyspnea, edema, etc...  ~  NuclearStressTest 8/07 showed a hypertensive response but no scar or ischemia, EF= 63%... no change from 2002 study... Toprol added... ~  EKG 9/14 showed NSR, rate60, WNL, NAD... ~  2DEcho 9/14 showed norm LV cavity size & wall thickness, norm wall motion & sys function w/ EF=55-60%, Gr1DD, mild RA&LA dil, valves looked ok... ~  3/15: on MetopXL50, Norvasc10, Lisinopril20; BP= 110/68 & similar at home; denies CP/angina, palpit, SOB or edema  ~  4/16: on ASA81, MetopXL50, Norvasc10, Lisinopril20; BP= 140/80 & better at home; denies CP/angina, palpit, SOB or edema. ~  4/17: on ASA81, MetopXL50, Norvasc10, Lisinopril20; BP= 138/80 & similar at home; he remains asymptomatic...  VENOUS INSUFFICIENCY (ICD-459.81) - he has mod VI & intermittent  min edema... he knows to elim salt, elevate, wear support hose, etc...  HYPERCHOLESTEROLEMIA (ICD-272.0) - on SIMVASTATIN 102m/d...  ~  [place]/08 showed TChol 144, TG 64, HDL 41, LDL 90...  ~  [place] showed TChol 173, TG 82, HDL 51, LDL 106... rec- same med + better diet. ~  FLP 3/11 showed TChol 158, TG 116, HDL 54, LDL 81 ~  FLP 3/12 on Simva40 showed TChol 155, TG 55, HDL 54, LDL 90 ~  FLP 3/13 on Simva40 showed TChol 162, TG 79, HDL 56, LDL 90 ~  FLP 3/14 on Simva40 showed TChol 143, TG 44, HDL 57, LDL 78 ~  FLP 9/14 in hosp showed TChol 154, TG 51, HDL 63, LDL 81... rec to continue Simva40. ~  FLP 3/15 on simva40 showed TChol 173, TG 103, HDL 56, LDL 97 ~  FLP 4/16 on Simva40 showed TChol 165, TG 80, HDL 60, LDL 89; continue same ~  FLP 4/17 on Simva40 showed TChol 136, TG 67, HDL 60, LDL 63; continue same...  OVERWEIGHT (ICD-278.02) -  ~  weight 11/09 = 212# since he cut way back on smoking... we discussed diet + exercise etc... ~  weight 3/11 = 207# ~  weight 3/12 = 211# ~  Weight 3/13 = 216# ~  Weight 3/14 = 211# ~  Weight 11/14 = 208# ~  Weight 3/15 = 214# ~  Weight 4/16 = 216# ~  Weight 4/17 = 208#  DIVERTICULOSIS OF COLON (ICD-562.10),  COLONIC POLYPS (ICD-211.3),  & HEMORRHOIDS (ICD-455.6) -  - last colonoscopy 9/08 by DrDBrodie showed several 3-429mpolyps & divertics... path= adenomatous and f/u planned in 5 yrs... ~  F/u colonoscopy 11/13 by DrDBrodie showed 2 sessile polyps in desc colon, mod divertics, path= mult fragments of benign polypoid colorectal mucosa  GALLSTONES (ICD-574.20) - seen on prev CT Abd done by Urology...  Repeated URINARY TRACT INFECTIONS >> had Enterococcus UTI 1/14 treated by Urology w/ Levaquin... 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 he  gets cystoscopies less often now...BPH w/ mild obstructive symptoms... intermittently elevated PSA on testing in the past... ~  labs 3/11 showed PSA = 3.64 ~  labs 3/12 showed PSA= 3.98 ~  Labs 3/13 showed PSA= 3.87 ~  Urology f/u DrBorden 1/14> Hx UTIs, Bladder Cancer, BPH/LTOS, & ED- on Proscar5 & Flomax0.4; they did cysto (neg) & PVR was 600cc, symptoms better on Rx now; they did PSA 1/14= 4.46 (30%free)...  ~  9/14> hosp w/  epig & lower CP w/ rad to back; urine grew Enterococcus & treated again w/ Levaquin; Urology f/u DrBorden- hx bladder ca, neg cysto 1/14, BPH/LUTS w/ PVR>600 in past now improved on Finasteride & Flomax... ~  9/15: he had f/u DrBorden- urothelial ca of bladder, BPH/LUTS, ED, f/u PSA; felt to be stable... ~  4/16: he had 53moROV DrBorden & pt reports that PSA was good, & cysto was neg; they incr his Tamsulosin to 0.8 to help his bladder emptying... ~  He continues to follow w/ DrBorden every 641mo they do PSA and surveillance cystos...  DEGENERATIVE JOINT DISEASE (ICD-715.90) - eval for left knee pain by DrAlusio w/ torn cartilage & baker's cyst... knee tapped, given shot w/ relief... 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 ~  labs 11/09 showed Vit D level = 22... rec to start 2000 u daily. ~  labs 3/11 showed Vit D level = 50... continue supplemental Vit D at 1000 u daily.  Health Maintenance: ~  Immunizations:  he gets the seasonal flu vaccine yearly;  had the PNEUMOVAX 2009 (age66);   TDAP 3/11...   Past Surgical History:  Procedure Laterality Date  . BACK SURGERY    . 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 LUMBAR LAMINECTOMY/DECOMPRESSION MICRODISCECTOMY Bilateral 08/25/2017   Procedure: Bilateral Lumbar Three- Four, Lumbar Four-  Five, Lumbar Five- Sacral One Laminectomy;  Surgeon: ElKristeen MissMD;  [facility] Service: Neurosurgery;  Laterality: Bilateral;  Bilateral L3-4 L4-5 L5-S1 Laminectomy  . SKIN CANCER EXCISION     Surg x2...  . TONSILLECTOMY AND ADENOIDECTOMY     Surg as a child...  [facility] DrYeatts...   Outpatient Encounter Medications as of 03/17/2018  Medication Sig  . amLODipine (NORVASC) 10 MG tablet TAKE 1 TABLET EVERY DAY (Patient taking differently: Take 10 mg tablet by mouth daily)  . aspirin 81 MG tablet Take 81 mg by mouth daily.   . Marland Kitchen complex vitamins capsule Take 2 capsules by mouth daily.  . Marland Kitchenosartan (COZAAR) 100 MG tablet Take 1 tablet (100 mg total) by mouth daily.  . metoprolol succinate (TOPROL-XL) 50 MG 24 hr tablet Take 50 mg by mouth daily. Take with or immediately following a meal.  . Multiple Vitamin (MULTIVITAMIN) capsule Take 1 capsule by mouth daily.  . simvastatin (ZOCOR) 40 MG tablet Take 40 mg by mouth daily.  . tamsulosin (FLOMAX) 0.4 MG CAPS Take 0.4 mg by mouth daily after supper.   . [DISCONTINUED] atorvastatin (LIPITOR) 20 MG tablet Take 1 tablet (20 mg total) by mouth daily.  . [DISCONTINUED] naproxen sodium (ANAPROX) 220 MG tablet Take 440 mg by mouth 2 (two) times daily as needed (for pain).  . [DISCONTINUED] NON FORMULARY Apply 1-2 g topically 4 (four) times daily as needed (for pain in legs/feet). Compound : Ibuprofen15%/Baclofen1%/Lidocaine2%/Gabapentin3% 100g  . acetaminophen (TYLENOL) 500 MG tablet Take 1 tablet (500 mg total) by mouth every 6 (six) hours as needed for moderate pain or headache. (Patient not taking: Reported on 03/17/2018)  . varenicline (CHANTIX PAK) 0.5 MG X 11 & 1 MG X 42 tablet Take by mouth 2 (two) times daily. Take one 0.5 mg tablet by  mouth once daily for 3 days, then increase to one 0.5 mg tablet twice daily for 4 days, then increase to one 1 mg tablet twice daily.   No facility-administered encounter medications  on file as of 03/17/2018.     Allergies  Allergen Reactions  . Sulfa Antibiotics Other (See Comments)    Rash, light headed, shaking  . Penicillins Other (See Comments)    REACTION: hives severe as a child .pen    Immunization History  Administered Date(s) Administered  . Influenza Split 10/01/2011, 09/12/2012, 08/07/2014, 11/11/2017  . Influenza Whole 09/11/2009, 09/02/2010  . Influenza,inj,Quad PF,6+ Mos 07/30/2013, 09/12/2015, 10/11/2016  . Pneumococcal Conjugate-13 01/26/2014  . Pneumococcal Polysaccharide-23 10/10/2008  . Td 01/15/2010    Current Medications, Allergies, Past Medical History, Past Surgical History, Family History, and Social History were reviewed in [employer] record.    Review of Systems    Constitutional:  Denies F/C/S, anorexia, unexpected weight change. HEENT:  No HA, visual changes, earache, nasal symptoms, sore throat, hoarseness. Resp:  No cough, sputum, hemoptysis; no SOB, tightness, wheezing. Cardio:  No CP, palpit, DOE, orthopnea, edema. GI:  Denies N/V/D/C or blood in stool; no reflux, abd pain, distention, or gas. GU:  No dysuria, freq, urgency, hematuria, or flank pain. MS:  Denies joint pain, swelling, tenderness, or decr ROM; no neck pain, back pain, etc. Neuro:  No tremors, seizures, dizziness, syncope, weakness, numbness, gait abn. Skin:  No suspicious lesions or skin rash. Heme:  No adenopathy, bruising, bleeding. Psyche: Denies confusion, sleep disturbance, hallucinations, anxiety, depression.   Objective:   Physical Exam    WD, Overweight, 77 y/o WM in NAD... GENERAL:  Alert & oriented; pleasant & cooperative... HEENT:  /AT, EOM-wnl, PERRLA, EACs-clear, TMs-wnl, NOSE-clear, THROAT-clear & wnl. NECK:  Supple w/ fairROM; no JVD; normal carotid impulses w/o bruits; no thyromegaly or nodules palpated; no lymphadenopathy. CHEST:  Clear to P & A; without wheezes/ rales/ or rhonchi heard... HEART:  Regular  Rhythm; without murmurs/ rubs/ or gallops detected... ABDOMEN:  Soft & nontender; normal bowel sounds; no organomegaly or masses palpated... EXT: without deformities, mild arthritic changes; no varicose veins, +ven insuffic, tr edema... NEURO:  CN's intact; motor testing normal; sensory testing normal; gait normal & balance OK. DERM:  No lesions noted; no rash etc...  RADIOLOGY DATA:  Reviewed in the EPIC EMR & discussed w/ the patient...  LABORATORY DATA:  Reviewed in the EPIC EMR & discussed w/ the patient...     Assessment:      OSA>  +Home sleep test w/ AHI=43.1/hr & lowest O2sat=78%; CPAP titration test showed Optimal CPAP=11cmH2O w/ AHI=3.3 on CPAP, min O2sat on CPAP= 86%... 5/18>  ?he had trouble w/ the interface & never used the CPAP regularly, stopping it in the interval on his own;  We decided to check another Home sleep test to re-assess => similar results w/ nadir O2sat=74%. 6/18>   Sleep consult w/ Kallie Edward, referred to [facility] in the [facility] for mask densentization but unsuccessful as he could not tol any apparatus;  subseq referred to Dentist for oral appliance & they decided on an OTC device purchased over [employer]=> pt feels it is helping & now need ONO on this device to see if it has elim his O2 desats...   COPD/ smoker>  CXR w/ signs of underlying COPD, but he is asymptomatic, denies cough/ SOB/ etc; needs to incr activity/ exercise & must quit all smoking... 3/15>  Given Prevnar-13 2015 - 2018>  He continues to smoke & Vape despite all efforts to get him to quit... 5/19>  He continues to smoke & vape, says he's ready to try Chantix at this point- Rx written; he continues to deny symptoms and decline inhalers...  HBP>  Controlled on his 3 meds, continue same...  CHOL>  FLP is at the goals on Simva40 + diet...  OVERWEIGHT>  We reviewed diet & exercise program...  GI> Divertics, Polyps>  He is up to date w/ screening colonoscopies; he uses the Protonix 77m  prn....  Gallstones>  Asymptomatic stones seen on prev scan...  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  Other medical issues as noted...      Plan:     Patient's Medications  New Prescriptions   No medications on file  Previous Medications   ACETAMINOPHEN (TYLENOL) 500 MG TABLET    Take 1 tablet (500 mg total) by mouth every 6 (six) hours as needed for moderate pain or headache.   AMLODIPINE (NORVASC) 10 MG TABLET    TAKE 1 TABLET EVERY DAY   ASPIRIN 81 MG TABLET    Take 81 mg by mouth daily.    B COMPLEX VITAMINS CAPSULE    Take 2 capsules by mouth daily.   LOSARTAN (COZAAR) 100 MG TABLET    Take 1 tablet (100 mg total) by mouth daily.   METOPROLOL SUCCINATE (TOPROL-XL) 50 MG 24 HR TABLET    Take 50 mg by mouth daily. Take with or immediately following a meal.   MULTIPLE VITAMIN (MULTIVITAMIN) CAPSULE    Take 1 capsule by mouth daily.   SIMVASTATIN (ZOCOR) 40 MG TABLET    Take 40 mg by mouth daily.   TAMSULOSIN (FLOMAX) 0.4 MG CAPS    Take 0.4 mg by mouth daily after supper.    VARENICLINE (CHANTIX PAK) 0.5 MG X 11 & 1 MG X 42 TABLET    Take by mouth 2 (two) times daily. Take one 0.5 mg tablet by mouth once daily for 3 days, then increase to one 0.5 mg tablet twice daily for 4 days, then increase to one 1 mg tablet twice daily.  Modified Medications   No medications on file  Discontinued Medications   ATORVASTATIN (LIPITOR) 20 MG TABLET    Take 1 tablet (20 mg total) by mouth daily.   NAPROXEN SODIUM (ANAPROX) 220 MG TABLET    Take 440 mg by mouth 2 (two) times daily as needed (for pain).   NON FORMULARY    Apply 1-2 g topically 4 (four) times daily as needed (for pain in legs/feet). Compound : Ibuprofen15%/Baclofen1%/Lidocaine2%/Gabapentin3% 100g

## 2018-03-17 NOTE — Patient Instructions (Signed)
Today we updated your med list in our EPIC system...    Continue your current medications the same...  Today we checked your follow up FASTING blood work...    We will contact you w/ the results when available...     (As long as your Cholesterol results are good- you can continue the [facility])    (If a change is needed we can switch to the Lipitor/Atorvastatin)  Jeremy [name], you need to quit all the smoking!!! Do yourself & family a huge favor & QUIT...    Try CHANTIX (Rx written)  You should see an ENT specialist-- they can clean out your ear canals & check your throat/ larynx due to your smoking & voice issue...  Continue w/ your Urology follow up to monitor your bladder problems...  Call for any questions or if we can be of service in any way.Marland KitchenMarland Kitchen

## 2018-03-18 ENCOUNTER — Telehealth: Payer: Self-pay | Admitting: Pulmonary Disease

## 2018-03-18 NOTE — Telephone Encounter (Signed)
Left detailed message for Jeremy [name] explaining that we have received this message and will get Dr. Jeannine Kitten response.   Dr. Lenna Gilford, please advise on what the ONO is for and how you wish for the test to be done (on room air or with cpap/bipap). Thanks!

## 2018-03-19 LAB — VITAMIN D 1,25 DIHYDROXY
VITAMIN D 1, 25 (OH) TOTAL: 35 pg/mL (ref 18–72)
VITAMIN D3 1, 25 (OH): 35 pg/mL
Vitamin D2 1, 25 (OH)2: <8 pg/mL

## 2018-03-20 ENCOUNTER — Other Ambulatory Visit: Payer: Self-pay | Admitting: Pulmonary Disease

## 2018-03-20 DIAGNOSIS — R0902 Hypoxemia: Secondary | ICD-10-CM

## 2018-03-20 DIAGNOSIS — G4733 Obstructive sleep apnea (adult) (pediatric): Secondary | ICD-10-CM

## 2018-03-20 NOTE — Telephone Encounter (Signed)
Noted.  Will close message. 

## 2018-03-20 NOTE — Telephone Encounter (Signed)
Pt is returning call. Cb is [phone].

## 2018-03-20 NOTE — Telephone Encounter (Signed)
New ONO order placed. Nothing further needed at this time.

## 2018-03-20 NOTE — Telephone Encounter (Signed)
SN is aware and is calling him back.

## 2018-03-27 DIAGNOSIS — H6123 Impacted cerumen, bilateral: Secondary | ICD-10-CM | POA: Diagnosis not present

## 2018-03-27 DIAGNOSIS — H9193 Unspecified hearing loss, bilateral: Secondary | ICD-10-CM | POA: Insufficient documentation

## 2018-04-18 ENCOUNTER — Other Ambulatory Visit: Payer: Self-pay | Admitting: Pulmonary Disease

## 2018-06-18 DIAGNOSIS — Z961 Presence of intraocular lens: Secondary | ICD-10-CM | POA: Diagnosis not present

## 2018-06-18 DIAGNOSIS — H353121 Nonexudative age-related macular degeneration, left eye, early dry stage: Secondary | ICD-10-CM | POA: Diagnosis not present

## 2018-06-18 DIAGNOSIS — H02831 Dermatochalasis of right upper eyelid: Secondary | ICD-10-CM | POA: Diagnosis not present

## 2018-06-18 DIAGNOSIS — H02834 Dermatochalasis of left upper eyelid: Secondary | ICD-10-CM | POA: Diagnosis not present

## 2018-07-14 DIAGNOSIS — H02834 Dermatochalasis of left upper eyelid: Secondary | ICD-10-CM | POA: Diagnosis not present

## 2018-07-14 DIAGNOSIS — H02413 Mechanical ptosis of bilateral eyelids: Secondary | ICD-10-CM | POA: Diagnosis not present

## 2018-07-14 DIAGNOSIS — H02835 Dermatochalasis of left lower eyelid: Secondary | ICD-10-CM | POA: Diagnosis not present

## 2018-07-14 DIAGNOSIS — H53483 Generalized contraction of visual field, bilateral: Secondary | ICD-10-CM | POA: Diagnosis not present

## 2018-07-14 DIAGNOSIS — H02135 Senile ectropion of left lower eyelid: Secondary | ICD-10-CM | POA: Diagnosis not present

## 2018-07-14 DIAGNOSIS — H02132 Senile ectropion of right lower eyelid: Secondary | ICD-10-CM | POA: Diagnosis not present

## 2018-07-14 DIAGNOSIS — H02831 Dermatochalasis of right upper eyelid: Secondary | ICD-10-CM | POA: Diagnosis not present

## 2018-07-14 DIAGNOSIS — H0279 Other degenerative disorders of eyelid and periocular area: Secondary | ICD-10-CM | POA: Diagnosis not present

## 2018-07-14 DIAGNOSIS — H57813 Brow ptosis, bilateral: Secondary | ICD-10-CM | POA: Diagnosis not present

## 2018-07-14 DIAGNOSIS — H02832 Dermatochalasis of right lower eyelid: Secondary | ICD-10-CM | POA: Diagnosis not present

## 2018-07-14 DIAGNOSIS — H02423 Myogenic ptosis of bilateral eyelids: Secondary | ICD-10-CM | POA: Diagnosis not present

## 2018-08-06 DIAGNOSIS — Z85828 Personal history of other malignant neoplasm of skin: Secondary | ICD-10-CM | POA: Diagnosis not present

## 2018-08-06 DIAGNOSIS — D225 Melanocytic nevi of trunk: Secondary | ICD-10-CM | POA: Diagnosis not present

## 2018-08-06 DIAGNOSIS — C44722 Squamous cell carcinoma of skin of right lower limb, including hip: Secondary | ICD-10-CM | POA: Diagnosis not present

## 2018-08-06 DIAGNOSIS — Z86018 Personal history of other benign neoplasm: Secondary | ICD-10-CM | POA: Diagnosis not present

## 2018-08-06 DIAGNOSIS — L570 Actinic keratosis: Secondary | ICD-10-CM | POA: Diagnosis not present

## 2018-08-06 DIAGNOSIS — D1800 Hemangioma unspecified site: Secondary | ICD-10-CM | POA: Diagnosis not present

## 2018-08-06 DIAGNOSIS — L821 Other seborrheic keratosis: Secondary | ICD-10-CM | POA: Diagnosis not present

## 2018-08-06 DIAGNOSIS — D485 Neoplasm of uncertain behavior of skin: Secondary | ICD-10-CM | POA: Diagnosis not present

## 2018-08-06 DIAGNOSIS — L309 Dermatitis, unspecified: Secondary | ICD-10-CM | POA: Diagnosis not present

## 2018-08-11 ENCOUNTER — Other Ambulatory Visit: Payer: Self-pay | Admitting: Pulmonary Disease

## 2018-08-11 ENCOUNTER — Telehealth: Payer: Self-pay | Admitting: Pulmonary Disease

## 2018-08-11 MED ORDER — AMLODIPINE BESYLATE 10 MG PO TABS: ORAL_TABLET | ORAL | 0 refills | Status: DC

## 2018-08-11 NOTE — Telephone Encounter (Signed)
Rx has been sent in. Nothing further was needed. 

## 2018-08-18 DIAGNOSIS — Z23 Encounter for immunization: Secondary | ICD-10-CM | POA: Diagnosis not present

## 2018-08-31 DIAGNOSIS — C44722 Squamous cell carcinoma of skin of right lower limb, including hip: Secondary | ICD-10-CM | POA: Diagnosis not present

## 2018-09-04 ENCOUNTER — Other Ambulatory Visit: Payer: Self-pay | Admitting: Pulmonary Disease

## 2018-09-09 ENCOUNTER — Other Ambulatory Visit: Payer: Self-pay | Admitting: Pulmonary Disease

## 2018-10-13 DIAGNOSIS — H02834 Dermatochalasis of left upper eyelid: Secondary | ICD-10-CM | POA: Diagnosis not present

## 2018-10-13 DIAGNOSIS — H02835 Dermatochalasis of left lower eyelid: Secondary | ICD-10-CM | POA: Diagnosis not present

## 2018-10-13 DIAGNOSIS — H02132 Senile ectropion of right lower eyelid: Secondary | ICD-10-CM | POA: Diagnosis not present

## 2018-10-13 DIAGNOSIS — H02413 Mechanical ptosis of bilateral eyelids: Secondary | ICD-10-CM | POA: Diagnosis not present

## 2018-10-13 DIAGNOSIS — H53483 Generalized contraction of visual field, bilateral: Secondary | ICD-10-CM | POA: Diagnosis not present

## 2018-10-13 DIAGNOSIS — H02831 Dermatochalasis of right upper eyelid: Secondary | ICD-10-CM | POA: Diagnosis not present

## 2018-10-13 DIAGNOSIS — H02423 Myogenic ptosis of bilateral eyelids: Secondary | ICD-10-CM | POA: Diagnosis not present

## 2018-10-13 DIAGNOSIS — H57813 Brow ptosis, bilateral: Secondary | ICD-10-CM | POA: Diagnosis not present

## 2018-10-13 DIAGNOSIS — H0279 Other degenerative disorders of eyelid and periocular area: Secondary | ICD-10-CM | POA: Diagnosis not present

## 2018-10-13 DIAGNOSIS — H02135 Senile ectropion of left lower eyelid: Secondary | ICD-10-CM | POA: Diagnosis not present

## 2018-10-13 DIAGNOSIS — H02832 Dermatochalasis of right lower eyelid: Secondary | ICD-10-CM | POA: Diagnosis not present

## 2018-10-31 ENCOUNTER — Other Ambulatory Visit: Payer: Self-pay | Admitting: Pulmonary Disease

## 2018-11-06 ENCOUNTER — Telehealth: Payer: Self-pay | Admitting: Pulmonary Disease

## 2018-11-06 NOTE — Telephone Encounter (Signed)
Called and spoke to pt's spouse, [name] (DPR).  [name] is requesting dates of prevar 47. I have provided pat with date of prevar 13 vaccine per our records.  Pat voiced her understanding and had no further questions. Nothing further is needed.

## 2018-11-18 DIAGNOSIS — Z8551 Personal history of malignant neoplasm of bladder: Secondary | ICD-10-CM | POA: Diagnosis not present

## 2018-11-18 DIAGNOSIS — R8271 Bacteriuria: Secondary | ICD-10-CM | POA: Diagnosis not present

## 2018-11-24 ENCOUNTER — Telehealth: Payer: Self-pay | Admitting: Pulmonary Disease

## 2018-11-24 NOTE — Telephone Encounter (Signed)
Rec'd from [facility] forwarded 4 pages to Dr. Noralee Space

## 2018-12-11 ENCOUNTER — Telehealth: Payer: Self-pay | Admitting: Pulmonary Disease

## 2018-12-11 NOTE — Telephone Encounter (Signed)
Jenna from Dr. Lorina Rabon office is faxing over a surgical clearance form.

## 2018-12-11 NOTE — Telephone Encounter (Signed)
Noted by triage. Will await form to be faxed. Will route to [place] for F/U.

## 2018-12-11 NOTE — Telephone Encounter (Signed)
Patient has never been seen by RA before. Has only seen Dr. Lenna Gilford.

## 2018-12-11 NOTE — Telephone Encounter (Signed)
Left message for Jeremy [name].

## 2018-12-12 ENCOUNTER — Other Ambulatory Visit: Payer: Self-pay | Admitting: Pulmonary Disease

## 2018-12-12 HISTORY — PX: BLEPHAROPLASTY: SUR158

## 2018-12-14 ENCOUNTER — Ambulatory Visit (INDEPENDENT_AMBULATORY_CARE_PROVIDER_SITE_OTHER): Payer: Medicare Other | Admitting: Pulmonary Disease

## 2018-12-14 ENCOUNTER — Encounter: Payer: Self-pay | Admitting: Pulmonary Disease

## 2018-12-14 VITALS — BP 144/76 | HR 82 | Ht 70.5 in | Wt 194.0 lb

## 2018-12-14 DIAGNOSIS — G4733 Obstructive sleep apnea (adult) (pediatric): Secondary | ICD-10-CM | POA: Diagnosis not present

## 2018-12-14 DIAGNOSIS — F17210 Nicotine dependence, cigarettes, uncomplicated: Secondary | ICD-10-CM

## 2018-12-14 DIAGNOSIS — Z7189 Other specified counseling: Secondary | ICD-10-CM

## 2018-12-14 MED ORDER — NICOTINE POLACRILEX 4 MG MT GUM: 4.0000 mg | CHEWING_GUM | OROMUCOSAL | 0 refills | Status: DC | PRN

## 2018-12-14 MED ORDER — NICOTINE 21 MG/24HR TD PT24: 21.0000 mg | MEDICATED_PATCH | Freq: Every day | TRANSDERMAL | 0 refills | Status: DC

## 2018-12-14 NOTE — Telephone Encounter (Signed)
Called and spoke with Patients Wife. Patient is having under eyes procedure, 12/15/18.  He was a OSA/PCP Patient of Dr Lenna Gilford.  Surgical clearance form faxed to Dr Elsworth Soho.  Patient saw Dr Elsworth Soho, 04/2017, for OSA.  Wyn Quaker, NP aware form is to be faxed to Dr Linton Rump office.   Called [phone], Eliezer Lofts, and left message to fax surgical consent, and give a call back.

## 2018-12-14 NOTE — Telephone Encounter (Signed)
Surgical clearance form received and given to [facility], for 12/14/18 OV.

## 2018-12-14 NOTE — Telephone Encounter (Signed)
Called and spoke with [place].  New surgical clearance form is to be faxed to [phone], triage fax.  They are aware of appointment with Wyn Quaker, NP, at 3:30pm.

## 2018-12-14 NOTE — Patient Instructions (Addendum)
Okay to continue forward with nasal device that you purchase on [place] for management of sleep apnea  We will order an overnight oximetry test to check your oxygen levels  >>> Suspect you may need oxygen at night but we will use this test to see if that is the case   We recommend that you stop smoking.  >>>You need to set a quit date >>>If you have friends or family who smoke, let them know you are trying to quit and not to smoke around you or in your living environment  Smoking Cessation Resources:  1 800 QUIT NOW  >>> Patient to call this resource and utilize it to help support her quit smoking >>> Keep up your hard work with stopping smoking  You can also contact the [facility] >>>For smoking cessation classes call [phone]  We do not recommend using e-cigarettes as a form of stopping smoking  You can sign up for smoking cessation support texts and information:  >>>https://smokefree.gov/smokefreetxt   Nicotine patches: >>>Make sure you rotate sites that you do not get skin irritation, Apply 1 patch each morning to a non-hairy skin site  If you are smoking greater than 10 cigarettes/day and weigh over 45 kg start with the nicotine patch of 21 mg a day for 6 weeks, then 14 mg a day for 2 weeks, then finished with 7 mg a day for 2 weeks, then stop   >>>If insomnia occurs you are having trouble sleeping you can take the patch off at night, and place a new one on in the morning >>>If the patch is removed at night and you have morning cravings start short acting nicotine replacement therapy such as gum or lozenges  Nicotine Gum:  >>>Smokers who smoke fewer than 25 cigarettes a day should use 2 mg dose  Proper chewing of gum is important for optimal results.   >>>Do not chew gum to rapidly.  Once you start chewing eating tasty peppery taste and slide the gum to your cheek.  When the taste disappears to a few more times.  Repeat this for 30 minutes.  Then discard  the gum.  >>>Avoid acidic beverages such as coffee, carbonated beverages before and during gum use.  A soft acidic beverages lower oral pH which cause nicotine to not be absorbed properly >>>If you chew the gum too quickly or vigorously you could have nausea, vomiting, abdominal pain, constipation, hiccups, headache, sore jaw, mouth irritation ulcers     Patient is okay to proceed forward with eyelid surgery  Peri-operative Assessment of Pulmonary Risk for Non-Thoracic Surgery:  ForMr. Ficek, risk of perioperative pulmonary complications is increased by:  Age greater than 62 years  Current Smoker   Obstructive sleep apnea   Respiratory complications generally occur in 1% of ASA Class I patients, 5% of ASA Class II and 10% of ASA Class III-IV patients These complications rarely result in mortality and iclude postoperative pneumonia, atelectasis, pulmonary embolism, ARDS and increased time requiring postoperative mechanical ventilation.  Overall, I recommend proceeding with the surgery if the risk for respiratory complications are outweighed by the potential benefits. This will need to be discussed between the patient and surgeon.  To reduce risks of respiraotry complications, I recommend: --Stop Smoking --Pre- and post-operative incentive spirometry performed frequently while awake --If Inpatient, use of currently prescribed positive-pressure for OSA whenever the patient is sleeping --Avoiding use of pancuronium during anesthesia.  I have discussed the risk factors and recommendations above with the patient.  I have referred you to primary care management at [facility] follow-up with Dr. Elsworth Soho in 6 months or sooner if symptoms worsen >>> This needs to be a new patient 30-minute visit    It is flu season:   >>>Remember to be washing your hands regularly, using hand sanitizer, be careful to use around herself with has contact with people who are sick will  increase her chances of getting sick yourself. >>> Best ways to protect herself from the flu: Receive the yearly flu vaccine, practice good hand hygiene washing with soap and also using hand sanitizer when available, eat a nutritious meals, get adequate rest, hydrate appropriately   Please contact the office if your symptoms worsen or you have concerns that you are not improving.   Thank you for choosing [facility] for your healthcare, and for allowing [place] to partner with you on your healthcare journey. I am thankful to be able to provide care to you today.   Wyn Quaker FNP-C

## 2018-12-14 NOTE — Assessment & Plan Note (Addendum)
Assessment: Current every day smoker smoking half a pack a day Patient is stop vaping Patient is willing to stop smoking but has not set a quit date Patient is willing to use nicotine replacement therapy Patient reports previous failure of Chantix  Plan: You need to stop smoking You need to set a quit date Prescription of 21 mg nicotine patches placed Prescription of 4 mg nicotine gum placed

## 2018-12-14 NOTE — Assessment & Plan Note (Addendum)
Assessment: Patient reports he has maintained well using his [email or web address] nasal device  Plan: Continue use of [email or web address] nasal device If daytime fatigue or sleepiness worsens contact our office We will also order overnight oximetry to check oxygen levels Schedule 1-month follow-up with Dr. Elsworth Soho

## 2018-12-14 NOTE — Progress Notes (Signed)
@Patient  ID: Jeremy [name], male    DOB: Oct 21, 1941, 78 y.o.   MRN: [id number]  Chief Complaint  Patient presents with  . Follow-up    Surg clearence, OSA follow up     Referring provider: Noralee Space, MD  HPI:  78 year old male current smoker followed in our office for primary care and OSA   Smoker/ Smoking History: Former e cigarette user, current smoker. 0.5 ppd day.  Maintenance:  none Pt of: Dr. Lenna Gilford  12/14/2018  - Visit   78 year old male patient followed in our office for obstructive sleep apnea as well as primary care with Dr. Lenna Gilford.  Patient had previous home sleep study back in May/2018 which showed an AHI of 25.  Patient never started CPAP therapy and instead has been managing his sleep apnea with device that he purchased off of a [email or web address]  Patient reports that this is been maintaining his sleep as well as snoring well.  Patient reports he sleeps better and has less fatigue.    Last office visit says the patient should have had an overnight oximetry test ordered to check for oxygenation.  Previous home sleep study showed an average oxygenation of 86%.   Tests:   03/02/2018-chest x-ray- minimal basilar atelectasis noted, lungs otherwise clear  04/02/2017-home sleep study-AHI 25, SaO2 low 74%   FENO:  No results found for: NITRICOXIDE  PFT: No flowsheet data found.  Imaging: No results found.    Specialty Problems      Pulmonary Problems   Chronic bronchitis (HCC)    Qualifier: Diagnosis of  By: Lenna Gilford MD, Deborra Medina       OSA (obstructive sleep apnea)    Severe OSA noted in 03/2016 that has decreased to moderate degree with 18 pound weight loss         Allergies  Allergen Reactions  . Sulfa Antibiotics Other (See Comments)    Rash, light headed, shaking  . Penicillins Other (See Comments)    REACTION: hives severe as a child .pen    Immunization History  Administered Date(s) Administered  . Influenza Split 10/01/2011, 09/12/2012,  08/07/2014, 11/11/2017  . Influenza Whole 09/11/2009, 09/02/2010  . Influenza,inj,Quad PF,6+ Mos 07/30/2013, 09/12/2015, 10/11/2016  . Pneumococcal Conjugate-13 01/26/2014  . Pneumococcal Polysaccharide-23 10/10/2008  . Td 01/15/2010    Past Medical History:  Diagnosis Date  . Enlarged prostate   . Hyperlipidemia   . Hypertension   . Self-catheterizes urinary bladder    pt. does this morning and evening--been doing this since 11/2016  . Sleep apnea    not using a cpap machine    Tobacco History: Social History   Tobacco Use  Smoking Status Former Smoker  . Packs/day: 1.00  . Years: 50.00  . Pack years: 50.00  . Types: Cigarettes  . Start date: 12/29/1961  Smokeless Tobacco Never Used  Tobacco Comment   uses vapor cigarette-quit vaping 12/14/18- smoking less than 12 a day 12/14/18   Counseling given: Yes Comment: uses vapor cigarette-quit vaping 12/14/18- smoking less than 12 a day 12/14/18  Smoking assessment and cessation counseling  Patient currently smoking: 0.5 ppd  I have advised the patient to quit/stop smoking as soon as possible due to high risk for multiple medical problems.  It will also be very difficult for [place] to manage patient's  respiratory symptoms and status if we continue to expose her lungs to a known irritant.  We do not advise e-cigarettes as a form of stopping smoking.  Patient is  willing to quit. Pt has not set a quit date.   I have advised the patient that we can assist and have options of nicotine replacement therapy, provided smoking cessation education today, provided smoking cessation counseling, and provided cessation resources. Stopped chantix, willing to use nrt.   Follow-up next office visit office visit for assessment of smoking cessation.    Smoking cessation counseling advised for: 8 min  CPT 99406 3 to 10 minutes CPT 99407 greater than 10 minutes   Outpatient Encounter Medications as of 12/14/2018  Medication Sig  . acetaminophen  (TYLENOL) 500 MG tablet Take 1 tablet (500 mg total) by mouth every 6 (six) hours as needed for moderate pain or headache.  Marland Kitchen amLODipine (NORVASC) 10 MG tablet TAKE 1 TABLET EVERY DAY  . aspirin 81 MG tablet Take 81 mg by mouth daily.   Marland Kitchen b complex vitamins capsule Take 2 capsules by mouth daily.  Marland Kitchen losartan (COZAAR) 100 MG tablet TAKE 1 TABLET EVERY DAY  . metoprolol succinate (TOPROL-XL) 50 MG 24 hr tablet TAKE 1 TABLET DAILY WITH OR IMMEDIATELY FOLLOWING A MEAL  . Multiple Vitamin (MULTIVITAMIN) capsule Take 1 capsule by mouth daily.  . simvastatin (ZOCOR) 40 MG tablet TAKE 1 TABLET EVERY DAY  . tamsulosin (FLOMAX) 0.4 MG CAPS Take 0.4 mg by mouth daily after supper.   . varenicline (CHANTIX PAK) 0.5 MG X 11 & 1 MG X 42 tablet Take by mouth 2 (two) times daily. Take one 0.5 mg tablet by mouth once daily for 3 days, then increase to one 0.5 mg tablet twice daily for 4 days, then increase to one 1 mg tablet twice daily.  . nicotine (NICODERM CQ) 21 mg/24hr patch Place 1 patch (21 mg total) onto the skin daily.  . nicotine polacrilex (NICORETTE) 4 MG gum Take 1 each (4 mg total) by mouth as needed for smoking cessation.   No facility-administered encounter medications on file as of 12/14/2018.      Review of Systems  Review of Systems  Constitutional: Negative for activity change, chills, fatigue, fever and unexpected weight change.  HENT: Negative for postnasal drip, rhinorrhea, sinus pressure, sinus pain, sneezing and sore throat.   Eyes: Negative.   Respiratory: Negative for cough, shortness of breath and wheezing.   Cardiovascular: Negative for chest pain and palpitations.  Gastrointestinal: Negative for constipation, diarrhea, nausea and vomiting.  Endocrine: Negative.   Musculoskeletal: Negative.   Skin: Negative.   Neurological: Negative for dizziness and headaches.  Psychiatric/Behavioral: Negative.  Negative for dysphoric mood. The patient is not nervous/anxious.   All other  systems reviewed and are negative.    Physical Exam  BP (!) 144/76 (BP Location: Left Arm, Cuff Size: Normal)   Pulse 82   Ht 5' 10.5" (1.791 m)   Wt 194 lb (88 kg)   SpO2 94%   BMI 27.44 kg/m   Wt Readings from Last 5 Encounters:  12/14/18 194 lb (88 kg)  03/17/18 182 lb (82.6 kg)  03/03/18 189 lb 13.1 oz (86.1 kg)  08/21/17 183 lb 14.4 oz (83.4 kg)  04/11/17 187 lb (84.8 kg)    Physical Exam  Constitutional: He is oriented to person, place, and time and well-developed, well-nourished, and in no distress. No distress.  HENT:  Head: Normocephalic and atraumatic.  Right Ear: Hearing, tympanic membrane, external ear and ear canal normal.  Left Ear: Hearing, tympanic membrane, external ear and ear canal normal.  Nose: Nose normal. Right sinus exhibits no maxillary sinus  tenderness and no frontal sinus tenderness. Left sinus exhibits no maxillary sinus tenderness and no frontal sinus tenderness.  Mouth/Throat: Uvula is midline and oropharynx is clear and moist. No oropharyngeal exudate.  Eyes: Pupils are equal, round, and reactive to light.  Neck: Normal range of motion. Neck supple.  Cardiovascular: Normal rate, regular rhythm and normal heart sounds.  Pulmonary/Chest: Effort normal and breath sounds normal. No accessory muscle usage. No respiratory distress. He has no decreased breath sounds. He has no wheezes. He has no rhonchi.  Musculoskeletal: Normal range of motion.        General: No edema.  Lymphadenopathy:    He has no cervical adenopathy.  Neurological: He is alert and oriented to person, place, and time. Gait normal.  Skin: Skin is warm and dry. He is not diaphoretic. No erythema.  Psychiatric: Mood, memory, affect and judgment normal.  Nursing note and vitals reviewed.     Lab Results:  CBC    Component Value Date/Time   WBC 7.5 03/17/2018 1253   RBC 4.87 03/17/2018 1253   HGB 15.4 03/17/2018 1253   HCT 44.9 03/17/2018 1253   PLT 353.0 03/17/2018 1253    MCV 92.2 03/17/2018 1253   MCH 30.8 03/04/2018 0528   MCHC 34.3 03/17/2018 1253   RDW 14.7 03/17/2018 1253   LYMPHSABS 2.4 03/17/2018 1253   MONOABS 0.6 03/17/2018 1253   EOSABS 0.1 03/17/2018 1253   BASOSABS 0.1 03/17/2018 1253    BMET    Component Value Date/Time   NA 139 03/17/2018 1253   K 4.4 03/17/2018 1253   CL 100 03/17/2018 1253   CO2 31 03/17/2018 1253   GLUCOSE 83 03/17/2018 1253   BUN 23 03/17/2018 1253   CREATININE 1.22 03/17/2018 1253   CALCIUM 9.6 03/17/2018 1253   GFRNONAA 56 (L) 03/04/2018 0528   GFRAA >60 03/04/2018 0528    BNP    Component Value Date/Time   BNP 135.4 (H) 03/03/2018 0227    ProBNP    Component Value Date/Time   PROBNP 73.0 10/13/2009 1033      Assessment & Plan:     Surgical counseling visit Patient is okay to proceed forward with eyelid surgery  Peri-operative Assessment of Pulmonary Risk for Non-Thoracic Surgery:  ForMr. Lebon, risk of perioperative pulmonary complications is increased by:  Age greater than 26 years  Current Smoker   Obstructive sleep apnea   Respiratory complications generally occur in 1% of ASA Class I patients, 5% of ASA Class II and 10% of ASA Class III-IV patients These complications rarely result in mortality and iclude postoperative pneumonia, atelectasis, pulmonary embolism, ARDS and increased time requiring postoperative mechanical ventilation.  Overall, I recommend proceeding with the surgery if the risk for respiratory complications are outweighed by the potential benefits. This will need to be discussed between the patient and surgeon.  To reduce risks of respiraotry complications, I recommend: --Stop Smoking --Pre- and post-operative incentive spirometry performed frequently while awake --If Inpatient, use of currently prescribed positive-pressure for OSA whenever the patient is sleeping --Avoiding use of pancuronium during anesthesia.  I have discussed the risk factors and  recommendations above with the patient.  OSA (obstructive sleep apnea) Assessment: Patient reports he has maintained well using his [email or web address] nasal device  Plan: Continue use of [employer] nasal device If daytime fatigue or sleepiness worsens contact our office We will also order overnight oximetry to check oxygen levels Schedule 33-month follow-up with Dr. Elsworth Soho  Cigarette smoker Assessment: Current every day smoker  smoking half a pack a day Patient is stop vaping Patient is willing to stop smoking but has not set a quit date Patient is willing to use nicotine replacement therapy Patient reports previous failure of Chantix  Plan: You need to stop smoking You need to set a quit date Prescription of 21 mg nicotine patches placed Prescription of 4 mg nicotine gum placed     Lauraine Rinne, NP 12/14/2018   This appointment was 35 minutes long with over 50% of the time in direct face-to-face patient care, assessment, plan of care, and follow-up.

## 2018-12-14 NOTE — Assessment & Plan Note (Signed)
Patient is okay to proceed forward with eyelid surgery  Peri-operative Assessment of Pulmonary Risk for Non-Thoracic Surgery:  Jeremy [name], risk of perioperative pulmonary complications is increased by:  Age greater than 69 years  Current Smoker   Obstructive sleep apnea   Respiratory complications generally occur in 1% of ASA Class I patients, 5% of ASA Class II and 10% of ASA Class III-IV patients These complications rarely result in mortality and iclude postoperative pneumonia, atelectasis, pulmonary embolism, ARDS and increased time requiring postoperative mechanical ventilation.  Overall, I recommend proceeding with the surgery if the risk for respiratory complications are outweighed by the potential benefits. This will need to be discussed between the patient and surgeon.  To reduce risks of respiraotry complications, I recommend: --Stop Smoking --Pre- and post-operative incentive spirometry performed frequently while awake --If Inpatient, use of currently prescribed positive-pressure for OSA whenever the patient is sleeping --Avoiding use of pancuronium during anesthesia.  I have discussed the risk factors and recommendations above with the patient.

## 2018-12-15 DIAGNOSIS — H02132 Senile ectropion of right lower eyelid: Secondary | ICD-10-CM | POA: Diagnosis not present

## 2018-12-15 DIAGNOSIS — H02422 Myogenic ptosis of left eyelid: Secondary | ICD-10-CM | POA: Diagnosis not present

## 2018-12-15 DIAGNOSIS — H02125 Mechanical ectropion of left lower eyelid: Secondary | ICD-10-CM | POA: Diagnosis not present

## 2018-12-15 DIAGNOSIS — S0181XS Laceration without foreign body of other part of head, sequela: Secondary | ICD-10-CM | POA: Diagnosis not present

## 2018-12-15 DIAGNOSIS — H02135 Senile ectropion of left lower eyelid: Secondary | ICD-10-CM | POA: Diagnosis not present

## 2018-12-15 DIAGNOSIS — H02122 Mechanical ectropion of right lower eyelid: Secondary | ICD-10-CM | POA: Diagnosis not present

## 2018-12-19 ENCOUNTER — Other Ambulatory Visit: Payer: Self-pay | Admitting: Pulmonary Disease

## 2018-12-31 ENCOUNTER — Encounter: Payer: Self-pay | Admitting: Pulmonary Disease

## 2018-12-31 DIAGNOSIS — R0902 Hypoxemia: Secondary | ICD-10-CM | POA: Diagnosis not present

## 2018-12-31 DIAGNOSIS — J449 Chronic obstructive pulmonary disease, unspecified: Secondary | ICD-10-CM | POA: Diagnosis not present

## 2019-01-05 ENCOUNTER — Encounter: Payer: Self-pay | Admitting: Family Medicine

## 2019-01-05 ENCOUNTER — Ambulatory Visit (INDEPENDENT_AMBULATORY_CARE_PROVIDER_SITE_OTHER): Payer: Medicare Other | Admitting: Family Medicine

## 2019-01-05 ENCOUNTER — Other Ambulatory Visit: Payer: Self-pay | Admitting: Pulmonary Disease

## 2019-01-05 VITALS — BP 122/74 | HR 70 | Temp 97.6°F | Ht 70.5 in | Wt 188.0 lb

## 2019-01-05 DIAGNOSIS — N400 Enlarged prostate without lower urinary tract symptoms: Secondary | ICD-10-CM

## 2019-01-05 DIAGNOSIS — H6123 Impacted cerumen, bilateral: Secondary | ICD-10-CM

## 2019-01-05 DIAGNOSIS — N138 Other obstructive and reflux uropathy: Secondary | ICD-10-CM

## 2019-01-05 DIAGNOSIS — R972 Elevated prostate specific antigen [PSA]: Secondary | ICD-10-CM

## 2019-01-05 DIAGNOSIS — N401 Enlarged prostate with lower urinary tract symptoms: Secondary | ICD-10-CM | POA: Diagnosis not present

## 2019-01-05 DIAGNOSIS — J411 Mucopurulent chronic bronchitis: Secondary | ICD-10-CM | POA: Diagnosis not present

## 2019-01-05 DIAGNOSIS — G4733 Obstructive sleep apnea (adult) (pediatric): Secondary | ICD-10-CM

## 2019-01-05 DIAGNOSIS — I10 Essential (primary) hypertension: Secondary | ICD-10-CM

## 2019-01-05 DIAGNOSIS — E785 Hyperlipidemia, unspecified: Secondary | ICD-10-CM | POA: Diagnosis not present

## 2019-01-05 DIAGNOSIS — F17210 Nicotine dependence, cigarettes, uncomplicated: Secondary | ICD-10-CM | POA: Diagnosis not present

## 2019-01-05 LAB — CBC
HCT: 46.3 % (ref 39.0–52.0)
HEMOGLOBIN: 15.5 g/dL (ref 13.0–17.0)
MCHC: 33.4 g/dL (ref 30.0–36.0)
MCV: 93 fl (ref 78.0–100.0)
Platelets: 272 10*3/uL (ref 150.0–400.0)
RBC: 4.98 Mil/uL (ref 4.22–5.81)
RDW: 14.1 % (ref 11.5–15.5)
WBC: 8.1 10*3/uL (ref 4.0–10.5)

## 2019-01-05 LAB — COMPREHENSIVE METABOLIC PANEL
ALT: 13 U/L (ref 0–53)
AST: 13 U/L (ref 0–37)
Albumin: 4.2 g/dL (ref 3.5–5.2)
Alkaline Phosphatase: 57 U/L (ref 39–117)
BUN: 30 mg/dL — ABNORMAL HIGH (ref 6–23)
CO2: 30 mEq/L (ref 19–32)
Calcium: 9.4 mg/dL (ref 8.4–10.5)
Chloride: 102 mEq/L (ref 96–112)
Creatinine, Ser: 1.39 mg/dL (ref 0.40–1.50)
GFR: 49.34 mL/min — ABNORMAL LOW (ref >60.00)
Glucose, Bld: 90 mg/dL (ref 70–99)
POTASSIUM: 4.8 meq/L (ref 3.5–5.1)
Sodium: 140 mEq/L (ref 135–145)
Total Bilirubin: 1.1 mg/dL (ref 0.2–1.2)
Total Protein: 7.2 g/dL (ref 6.0–8.3)

## 2019-01-05 LAB — LIPID PANEL
Cholesterol: 140 mg/dL (ref 0–200)
HDL: 68.6 mg/dL (ref >39.00)
LDL CALC: 58 mg/dL (ref 0–99)
NonHDL: 71.22
Total CHOL/HDL Ratio: 2
Triglycerides: 68 mg/dL (ref 0.0–149.0)
VLDL: 13.6 mg/dL (ref 0.0–40.0)

## 2019-01-05 LAB — PSA: PSA: 5.41 ng/mL — ABNORMAL HIGH (ref 0.10–4.00)

## 2019-01-05 LAB — TSH: TSH: 0.95 u[IU]/mL (ref 0.35–4.50)

## 2019-01-05 NOTE — Assessment & Plan Note (Signed)
-  Self cath's at home due to obstruction, doing well.  -Update PSA today.  He'll continue to follow with Dr. Alinda Money in urology.

## 2019-01-05 NOTE — Assessment & Plan Note (Signed)
-  Recently had overnight oximetry for eval of overnight O2.  Unable to tolerate CPAP due to mask and humidification problems.

## 2019-01-05 NOTE — Progress Notes (Signed)
Jeremy [name] - 78 y.o. male MRN [id number]  Date of birth: Feb 18, 1941  Subjective Chief Complaint  Patient presents with  . Annual Exam    would like to get labs-discuss sleep apena    HPI GREYDON BETKE is a 78 y.o. male with history of HTN, COPD with continued nicotine use, BPH, and OSA here today for initial visit with PCP.  Recently had blepharoplasty, doing well from this.   -Chronic bronchitis/COPD/OSA:  History of mild COPD and OSA.  He does have a CPAP machine however has not been able to find a mask that works well for him.  He reports that he has tried multiple masks but is still unable to tolerate.  He states he spoke with Dr. Lenna Gilford previously about using O2 at night instead.  He reports that he recently had an overnight pulse oximetry but has not gotten results.  He is working on quitting smoking and is taking chantix, currently down to 1/4 ppd.  He is planning on continuing to see pulmonology as well.   -HTN:  Current treatment with toprol-xl, losartan and norvasc.  He reports he is doing well with current medications.  He denies side effects including symptoms of hypotension.  He has not had anginal symptoms, increased exertional dyspnea, palpitations, headache or vision changes.    -BPH:  History of bladder cancer and BPH.  He self catheterizes BID for urinary retention. This is working well for him. He is followed by Dr. Alinda Money with urology.  -Hearing loss:  Uses hearing aids but noted to have wax impaction on recent exam  ROS:  A comprehensive ROS was completed and negative except as noted per HPI  Allergies  Allergen Reactions  . Sulfa Antibiotics Other (See Comments)    Rash, light headed, shaking  . Penicillins Other (See Comments)    REACTION: hives severe as a child .pen    Past Medical History:  Diagnosis Date  . Enlarged prostate   . Hyperlipidemia   . Hypertension   . Self-catheterizes urinary bladder    pt. does this morning and evening--been doing this  since 11/2016  . Sleep apnea    not using a cpap machine    Past Surgical History:  Procedure Laterality Date  . BACK SURGERY    . 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 BLEPHAROPLASTY  12/2018  . LUMBAR LAMINECTOMY/DECOMPRESSION MICRODISCECTOMY Bilateral 08/25/2017   Procedure: Bilateral Lumbar Three- Four, Lumbar Four- Five, Lumbar Five- Sacral One Laminectomy;  Surgeon: Kristeen Miss, MD;  Location: [place];  Service: Neurosurgery;  Laterality: Bilateral;  Bilateral L3-4 L4-5 L5-S1 Laminectomy  . SKIN CANCER EXCISION     Surg x2...  . TONSILLECTOMY AND ADENOIDECTOMY     Surg as a child...    Social History   Socioeconomic History  . Marital status: Married    Spouse name: Not on file  . Number of children: Not on file  . Years of education: Not on file  . Highest education level: Not on file  Occupational History  . Not on file  Social Needs  . Financial resource strain: Not on file  . Food insecurity:    Worry: Not on file    Inability: Not on file  . Transportation needs:    Medical: Not on file    Non-medical: Not on file  Tobacco Use  . Smoking status: Former Smoker    Packs/day: 1.00    Years: 50.00    Pack years:  50.00    Types: Cigarettes    Start date: 12/29/1961  . Smokeless tobacco: Never Used  . Tobacco comment: uses vapor cigarette-quit vaping 12/14/18- smoking less than 12 a day 12/14/18  Substance and Sexual Activity  . Alcohol use: Yes    Comment: couple glasses with dinner  . Drug use: No  . Sexual activity: Not on file  Lifestyle  . Physical activity:    Days per week: Not on file    Minutes per session: Not on file  . Stress: Not on file  Relationships  . Social connections:    Talks on phone: Not on file    Gets together: Not on file    Attends religious service: Not on file    Active member of club or organization: Not on file    Attends meetings of clubs or organizations: Not on file    Relationship status: Not on file  Other Topics  Concern  . Not on file  Social History Narrative   Lives with wife in a 2 story home.  Has 2 children.  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 Colon cancer Neg Hx   . Stomach cancer Neg Hx   . Rectal cancer Neg Hx     Health Maintenance  Topic Date Due  . TETANUS/TDAP  01/16/2020  . INFLUENZA VACCINE  Completed  . PNA vac Low Risk Adult  Completed    ----------------------------------------------------------------------------------------------------------------------------------------------------------------------------------------------------------------- Physical Exam BP 122/74   Pulse 70   Temp 97.6 F (36.4 C) (Oral)   Ht 5' 10.5" (1.791 m)   Wt 188 lb (85.3 kg)   SpO2 94%   BMI 26.59 kg/m   Physical Exam Constitutional:      Appearance: Normal appearance.  HENT:     Head: Atraumatic.     Ears:     Comments: Bilateral cerumen impaction.  Lavage completed by Verlene Mayer, CMA.  No curette required.  Patient tolerated well.  Re-examined with normal canal and TM    Mouth/Throat:     Mouth: Mucous membranes are moist.  Eyes:     General: No scleral icterus. Neck:     Musculoskeletal: Neck supple.  Cardiovascular:     Rate and Rhythm: Normal rate and regular rhythm.     Heart sounds: Normal heart sounds.  Pulmonary:     Effort: Pulmonary effort is normal.     Breath sounds: Normal breath sounds.  Skin:    General: Skin is warm and dry.     Findings: No rash.  Neurological:     General: No focal deficit present.     Mental Status: He is alert.  Psychiatric:        Mood and Affect: Mood normal.        Behavior: Behavior normal.     ------------------------------------------------------------------------------------------------------------------------------------------------------------------------------------------------------------------- Assessment and Plan  Benign prostatic hyperplasia with  urinary obstruction -Self cath's at home due to obstruction, doing well.  -Update PSA today.  He'll continue to follow with Dr. Alinda Money in urology.   Essential hypertension -BP is stable.  Continue current medications. -Update labs today.  Return in about 6 months (around 07/06/2019) for HTN.   Chronic bronchitis -Stable, he'll continue to follow with pulmonology as well.  -Encouraged to quit smoking  OSA (obstructive sleep apnea) -Recently had overnight oximetry for eval of overnight O2.  Unable to tolerate CPAP due to mask and humidification problems.    Cigarette  smoker -Has had some success with chantix, encouraged to quit smoking.    Bilateral impacted cerumen Removed with lavage.

## 2019-01-05 NOTE — Assessment & Plan Note (Signed)
Removed with lavage.

## 2019-01-05 NOTE — Patient Instructions (Signed)
Great to meet you! Continue current medications and see me again in 6 months

## 2019-01-05 NOTE — Assessment & Plan Note (Signed)
-  Stable, he'll continue to follow with pulmonology as well.  -Encouraged to quit smoking

## 2019-01-05 NOTE — Assessment & Plan Note (Signed)
-  Has had some success with chantix, encouraged to quit smoking.

## 2019-01-05 NOTE — Assessment & Plan Note (Signed)
-  BP is stable.  Continue current medications. -Update labs today.  Return in about 6 months (around 07/06/2019) for HTN.

## 2019-01-08 ENCOUNTER — Telehealth: Payer: Self-pay | Admitting: Pulmonary Disease

## 2019-01-08 ENCOUNTER — Encounter: Payer: Self-pay | Admitting: Family Medicine

## 2019-01-08 DIAGNOSIS — G4733 Obstructive sleep apnea (adult) (pediatric): Secondary | ICD-10-CM

## 2019-01-08 NOTE — Telephone Encounter (Signed)
Called and spoke with patient, he is aware of response below and verbalized understanding. Patient has been scheduled for both PFT and follow up with RA. Order for night time o2 has been placed. Order for PFT has been placed. Patient verbalized understanding. No further questions or concerns. Nothing further needed.

## 2019-01-08 NOTE — Addendum Note (Signed)
Addended by: Della Goo C on: 01/08/2019 12:53 PM   Modules accepted: Orders

## 2019-01-08 NOTE — Telephone Encounter (Signed)
01/08/2019 1240  We have received the patient's overnight oximetry results.  Overnight oximetry results listed below:  12/31/2018-overnight oximetry on room air- 4 hours 30 minutes and 52 seconds below 88%, total time sleeping 5 hours and 29 minutes and 56 seconds.  Average SaO2 was 86.  Patient needs to be started on 2 L of oxygen at night.  I have discussed these results with Dr. Lenna Gilford.  Dr. Lenna Gilford would like the patient to be scheduled for pulmonary function test and a 4 to 6-week follow-up with Dr. Elsworth Soho.  If nothing available in 4 to 6 weeks then please schedule first appointment with Dr. Elsworth Soho after that time with a full PFT before per Dr. Lenna Gilford.  Please place the order for 2 L of O2 at night as well as a order for a full PFT.  Wyn Quaker, FNP

## 2019-01-11 ENCOUNTER — Other Ambulatory Visit: Payer: Self-pay

## 2019-01-11 ENCOUNTER — Telehealth: Payer: Self-pay | Admitting: Pulmonary Disease

## 2019-01-11 DIAGNOSIS — G4733 Obstructive sleep apnea (adult) (pediatric): Secondary | ICD-10-CM

## 2019-01-11 NOTE — Telephone Encounter (Signed)
That's correct insurance will not cover the oxygen if the patient has osa despite not tolerating cpap. We have ran into this before.

## 2019-01-11 NOTE — Telephone Encounter (Signed)
Called and spoke with the Melissa she says that if a patient has been DX with OSA insurance will not pay for oxygen until they are titrated in lab to optimal levels. Even if the patient does not tolerate a cpap insurance will not cover o2. She will offer private pay o2. She will call us back and let us know what the patient says. Sending to [facility] as FYI

## 2019-01-11 NOTE — Telephone Encounter (Signed)
Patient has been tried on CPAP before.  Patient is intolerant of CPAP.    Can this not be ran under nocturnal hypoxemia or associated with that?  Patient with probable COPD that is can be further evaluated with pulmonary function testing.   Patient is not currently managed on CPAP.  Patient uses a device from [employer] to help with managing obstructive sleep apnea.Jeremy Quaker, FNP

## 2019-01-11 NOTE — Telephone Encounter (Signed)
LVM for Melissa at [facility] to confirm why titration study was needed based on the ONO results. Requested clarification as to why it was not sufficient for patient need.

## 2019-01-11 NOTE — Telephone Encounter (Signed)
Returned call to [place], per insurance a patient cannot have a ONO for sleep apnea due to the patient being on Cpap. They require a in-lab cpap titration study to ensure what pressures are needed and how much oxygen to bleed in with cpap.    BM please advise if okay to place order for cpap titration study. I will notify the patient.

## 2019-01-11 NOTE — Telephone Encounter (Signed)
Melissa, [place], returning call, [phone]

## 2019-01-11 NOTE — Telephone Encounter (Signed)
Proceed forward with CPAP titration study in place the order.  I will follow-up with Dr. Elsworth Soho regarding the patient.Wyn Quaker, FNP

## 2019-01-11 NOTE — Telephone Encounter (Signed)
I have placed order for the CPAP titration

## 2019-01-11 NOTE — Telephone Encounter (Signed)
Even if the patient is intolerant of CPAP?  That does not seem reasonable.   Patient has been trialed on CPAP and patient was intolerant so patient is unable to wear CPAP then he needs to be on oxygen.  Will discuss case with Dr. Elsworth Soho if [facility] home [facility] cannot come to a solution regarding oxygen therapy.  Wyn Quaker FNP

## 2019-01-12 ENCOUNTER — Other Ambulatory Visit: Payer: Self-pay | Admitting: Family Medicine

## 2019-01-12 DIAGNOSIS — M216X1 Other acquired deformities of right foot: Secondary | ICD-10-CM | POA: Diagnosis not present

## 2019-01-12 DIAGNOSIS — M216X2 Other acquired deformities of left foot: Secondary | ICD-10-CM | POA: Diagnosis not present

## 2019-01-12 DIAGNOSIS — M71571 Other bursitis, not elsewhere classified, right ankle and foot: Secondary | ICD-10-CM | POA: Diagnosis not present

## 2019-01-12 NOTE — Telephone Encounter (Signed)
Copied from [place] [id number]. Topic: Quick Communication - Rx Refill/Question >> Jan 12, 2019  9:28 AM Sheran Luz wrote:  Medication: simvastatin (ZOCOR) 40 MG tablet, metoprolol succinate (TOPROL-XL) 50 MG 24 hr tablet, losartan (COZAAR) 100 MG tablet and amLODipine (NORVASC) 10 MG tablet.   Patient is requesting refills. Patient states he was advised to contact office to get these filled by new PCP, Dr. Zigmund Daniel.   Preferred Pharmacy (with phone number or street name):[facility] - [place], [place] [phone] (Phone) [phone] (Fax)

## 2019-01-12 NOTE — Telephone Encounter (Signed)
Pt seen in office by Dr [name], [facility], 01/05/2019; will route to office for new prescriptions (for amlodipine, metoprolol, simvastatin, and cozaar) to be sent to [facility].

## 2019-01-12 NOTE — Telephone Encounter (Signed)
Dr. Zigmund Daniel please advise on Simvastatin & losartan refill request. Simvastatin last filled 11/02/18 for #90 by Dr. Lenna Gilford. Losartan last filled 04/20/18 #90 with #3 RF by Dr. Lenna Gilford   Declined amlodipine & metoprolol as these were already refilled this month

## 2019-01-13 MED ORDER — SIMVASTATIN 40 MG PO TABS: 40.0000 mg | ORAL_TABLET | Freq: Every day | ORAL | 2 refills | Status: DC

## 2019-01-13 MED ORDER — LOSARTAN POTASSIUM 100 MG PO TABS: 100.0000 mg | ORAL_TABLET | Freq: Every day | ORAL | 3 refills | Status: DC

## 2019-01-15 NOTE — Telephone Encounter (Signed)
Discussed with Dr. Elsworth Soho.  Proceed forward with CPAP titration.  Keep scheduled follow-up with our office.  Please ensure the patient is scheduled for pulmonary function testing as well.  Wyn Quaker, FNP

## 2019-01-15 NOTE — Telephone Encounter (Signed)
PFT is on 02/11/2019 CPAP titration is on 02/25/2019.  Per chart review.  Nothing further is needed.  Wyn Quaker, FNP

## 2019-02-11 ENCOUNTER — Ambulatory Visit: Payer: Medicare Other | Admitting: Pulmonary Disease

## 2019-02-27 ENCOUNTER — Encounter (HOSPITAL_BASED_OUTPATIENT_CLINIC_OR_DEPARTMENT_OTHER): Payer: Medicare Other

## 2019-03-10 ENCOUNTER — Other Ambulatory Visit: Payer: Self-pay | Admitting: Pulmonary Disease

## 2019-04-01 DIAGNOSIS — H02832 Dermatochalasis of right lower eyelid: Secondary | ICD-10-CM | POA: Diagnosis not present

## 2019-04-01 DIAGNOSIS — H02112 Cicatricial ectropion of right lower eyelid: Secondary | ICD-10-CM | POA: Diagnosis not present

## 2019-04-01 DIAGNOSIS — H02412 Mechanical ptosis of left eyelid: Secondary | ICD-10-CM | POA: Diagnosis not present

## 2019-04-01 DIAGNOSIS — H02422 Myogenic ptosis of left eyelid: Secondary | ICD-10-CM | POA: Diagnosis not present

## 2019-04-22 ENCOUNTER — Other Ambulatory Visit: Payer: Self-pay | Admitting: Pulmonary Disease

## 2019-06-08 ENCOUNTER — Other Ambulatory Visit: Payer: Self-pay | Admitting: Pulmonary Disease

## 2019-06-15 ENCOUNTER — Other Ambulatory Visit: Payer: Self-pay | Admitting: Family Medicine

## 2019-06-22 DIAGNOSIS — Z961 Presence of intraocular lens: Secondary | ICD-10-CM | POA: Diagnosis not present

## 2019-06-22 DIAGNOSIS — H353131 Nonexudative age-related macular degeneration, bilateral, early dry stage: Secondary | ICD-10-CM | POA: Diagnosis not present

## 2019-06-30 ENCOUNTER — Other Ambulatory Visit: Payer: Self-pay | Admitting: Pulmonary Disease

## 2019-07-06 ENCOUNTER — Encounter: Payer: Self-pay | Admitting: Family Medicine

## 2019-07-06 ENCOUNTER — Other Ambulatory Visit: Payer: Self-pay

## 2019-07-06 ENCOUNTER — Ambulatory Visit (INDEPENDENT_AMBULATORY_CARE_PROVIDER_SITE_OTHER): Payer: Medicare Other

## 2019-07-06 ENCOUNTER — Ambulatory Visit (INDEPENDENT_AMBULATORY_CARE_PROVIDER_SITE_OTHER): Payer: Medicare Other | Admitting: Family Medicine

## 2019-07-06 VITALS — BP 150/78 | HR 86 | Temp 97.7°F | Ht 70.5 in | Wt 184.2 lb

## 2019-07-06 DIAGNOSIS — F17210 Nicotine dependence, cigarettes, uncomplicated: Secondary | ICD-10-CM | POA: Diagnosis not present

## 2019-07-06 DIAGNOSIS — R05 Cough: Secondary | ICD-10-CM | POA: Diagnosis not present

## 2019-07-06 DIAGNOSIS — Z23 Encounter for immunization: Secondary | ICD-10-CM

## 2019-07-06 DIAGNOSIS — T148XXA Other injury of unspecified body region, initial encounter: Secondary | ICD-10-CM | POA: Diagnosis not present

## 2019-07-06 DIAGNOSIS — I499 Cardiac arrhythmia, unspecified: Secondary | ICD-10-CM

## 2019-07-06 DIAGNOSIS — L989 Disorder of the skin and subcutaneous tissue, unspecified: Secondary | ICD-10-CM | POA: Diagnosis not present

## 2019-07-06 DIAGNOSIS — E785 Hyperlipidemia, unspecified: Secondary | ICD-10-CM

## 2019-07-06 DIAGNOSIS — I10 Essential (primary) hypertension: Secondary | ICD-10-CM | POA: Diagnosis not present

## 2019-07-06 LAB — CBC WITH DIFFERENTIAL/PLATELET
Basophils Absolute: 0 10*3/uL (ref 0.0–0.1)
Basophils Relative: 0.6 % (ref 0.0–3.0)
Eosinophils Absolute: 0.2 10*3/uL (ref 0.0–0.7)
Eosinophils Relative: 2.3 % (ref 0.0–5.0)
HCT: 47.3 % (ref 39.0–52.0)
Hemoglobin: 15.8 g/dL (ref 13.0–17.0)
Lymphocytes Relative: 22.9 % (ref 12.0–46.0)
Lymphs Abs: 1.9 10*3/uL (ref 0.7–4.0)
MCHC: 33.3 g/dL (ref 30.0–36.0)
MCV: 96.1 fl (ref 78.0–100.0)
Monocytes Absolute: 0.6 10*3/uL (ref 0.1–1.0)
Monocytes Relative: 7.1 % (ref 3.0–12.0)
Neutro Abs: 5.7 10*3/uL (ref 1.4–7.7)
Neutrophils Relative %: 67.1 % (ref 43.0–77.0)
Platelets: 263 10*3/uL (ref 150.0–400.0)
RBC: 4.92 Mil/uL (ref 4.22–5.81)
RDW: 13.9 % (ref 11.5–15.5)
WBC: 8.4 10*3/uL (ref 4.0–10.5)

## 2019-07-06 LAB — COMPREHENSIVE METABOLIC PANEL
ALT: 14 U/L (ref 0–53)
AST: 15 U/L (ref 0–37)
Albumin: 4.5 g/dL (ref 3.5–5.2)
Alkaline Phosphatase: 54 U/L (ref 39–117)
BUN: 29 mg/dL — ABNORMAL HIGH (ref 6–23)
CO2: 29 mEq/L (ref 19–32)
Calcium: 9.7 mg/dL (ref 8.4–10.5)
Chloride: 101 mEq/L (ref 96–112)
Creatinine, Ser: 1.53 mg/dL — ABNORMAL HIGH (ref 0.40–1.50)
GFR: 44.11 mL/min — ABNORMAL LOW (ref >60.00)
Glucose, Bld: 101 mg/dL — ABNORMAL HIGH (ref 70–99)
Potassium: 4.1 mEq/L (ref 3.5–5.1)
Sodium: 138 mEq/L (ref 135–145)
Total Bilirubin: 0.8 mg/dL (ref 0.2–1.2)
Total Protein: 7.7 g/dL (ref 6.0–8.3)

## 2019-07-06 NOTE — Progress Notes (Signed)
Jeremy [name] - 77 y.o. male MRN [id number]  Date of birth: 1941-06-14  Subjective Chief Complaint  Patient presents with  . Follow-up    f/u HTN/ wants flu shot/ wants you to look at arms places popped up and color change    HPI Jeremy [name] is a 78 y.o. male with history of HTN, bladder cancer, HLD and nicotine dependence here today for follow up of HTN.   -HTN: Current treatment with losartan, amlodipine and metoprolol.  He reports that he is doing well with current medication.  He checks BP at home with readings around 130/70 typically.  He denies symptoms of hypotension.  He has some easy bruising on his arms.  He stopped his asa recently because of this but hasn't noticed any improvement.  He denies chest pain, shortness of breath, palpitations, headache or vision changes.   -HLD:  Current treatment with simvastatin.  Doing well and denies myalgia.    -Nicotine dependence:  Continues to smoke.  He had nearly quit with chantix prior to pandemic but stress of being home with nothing to do caused him to start again.  He plans to try restarting chantix.   ROS:  A comprehensive ROS was completed and negative except as noted per HPI     Allergies  Allergen Reactions  . Sulfa Antibiotics Other (See Comments)    Rash, light headed, shaking  . Penicillins Other (See Comments)    REACTION: hives severe as a child .pen    Past Medical History:  Diagnosis Date  . Enlarged prostate   . Hyperlipidemia   . Hypertension   . Self-catheterizes urinary bladder    pt. does this morning and evening--been doing this since 11/2016  . Sleep apnea    not using a cpap machine    Past Surgical History:  Procedure Laterality Date  . BACK SURGERY    . 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 BLEPHAROPLASTY  12/2018  . LUMBAR LAMINECTOMY/DECOMPRESSION MICRODISCECTOMY Bilateral 08/25/2017   Procedure: Bilateral Lumbar Three- Four, Lumbar Four- Five, Lumbar Five- Sacral One Laminectomy;   Surgeon: Kristeen Miss, MD;  Location: [place];  Service: Neurosurgery;  Laterality: Bilateral;  Bilateral L3-4 L4-5 L5-S1 Laminectomy  . SKIN CANCER EXCISION     Surg x2...  . TONSILLECTOMY AND ADENOIDECTOMY     Surg as a child...    Social History   Socioeconomic History  . Marital status: Married    Spouse name: Not on file  . Number of children: Not on file  . Years of education: Not on file  . Highest education level: Not on file  Occupational History  . Not on file  Social Needs  . Financial resource strain: Not on file  . Food insecurity    Worry: Not on file    Inability: Not on file  . Transportation needs    Medical: Not on file    Non-medical: Not on file  Tobacco Use  . Smoking status: Former Smoker    Packs/day: 1.00    Years: 50.00    Pack years: 50.00    Types: Cigarettes    Start date: 12/29/1961  . Smokeless tobacco: Never Used  . Tobacco comment: uses vapor cigarette-quit vaping 12/14/18- smoking less than 12 a day 12/14/18  Substance and Sexual Activity  . Alcohol use: Yes    Comment: couple glasses with dinner  . Drug use: No  . Sexual activity: Not on file  Lifestyle  .  Physical activity    Days per week: Not on file    Minutes per session: Not on file  . Stress: Not on file  Relationships  . Social Herbalist on phone: Not on file    Gets together: Not on file    Attends religious service: Not on file    Active member of club or organization: Not on file    Attends meetings of clubs or organizations: Not on file    Relationship status: Not on file  Other Topics Concern  . Not on file  Social History Narrative   Lives with wife in a 2 story home.  Has 2 children.  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 Colon cancer Neg Hx   . Stomach cancer Neg Hx   . Rectal cancer Neg Hx     Health Maintenance  Topic Date Due  . INFLUENZA VACCINE  06/12/2019  . TETANUS/TDAP   01/16/2020  . PNA vac Low Risk Adult  Completed    ----------------------------------------------------------------------------------------------------------------------------------------------------------------------------------------------------------------- Physical Exam BP (!) 150/78   Pulse 86   Temp 97.7 F (36.5 C) (Oral)   Ht 5' 10.5" (1.791 m)   Wt 184 lb 3.2 oz (83.6 kg)   SpO2 98%   BMI 26.06 kg/m   Physical Exam Constitutional:      Appearance: Normal appearance.  HENT:     Head: Normocephalic and atraumatic.     Mouth/Throat:     Mouth: Mucous membranes are moist.  Eyes:     General: No scleral icterus. Cardiovascular:     Rate and Rhythm: Normal rate. Rhythm irregular.     Pulses: Normal pulses.     Heart sounds: Normal heart sounds.  Pulmonary:     Effort: Pulmonary effort is normal.     Breath sounds: Normal breath sounds.  Skin:    General: Skin is warm and dry.     Findings: Bruising (scattered bruising on bilateal arms.  ) and lesion (Rised lesion on L arm with keratin plug.  ) present.  Neurological:     General: No focal deficit present.     Mental Status: He is alert and oriented to person, place, and time.  Psychiatric:        Mood and Affect: Mood normal.    EKG:  Sinus rhythm with rate variation.  No ST-T wave changes.   ------------------------------------------------------------------------------------------------------------------------------------------------------------------------------------------------------------------- Assessment and Plan  Cigarette smoker -He plans to restart chantix and work on smoking reduction.   -Update CXR today.   Hyperlipidemia -Stable with simvastatin, continue at current dose.   Essential hypertension -Repeat BP 138/80.  He will continue current medications at current dose.  -Recommend quitting smoking and follow low salt diet.  -EKG completed for irregular heart rhythm and no change compared to  previous   Bruising Check CBC, CMP and INR.  Has stopped ASA and denies fish oil supplements.   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

## 2019-07-06 NOTE — Assessment & Plan Note (Signed)
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

## 2019-07-06 NOTE — Assessment & Plan Note (Signed)
-  Repeat BP 138/80.  He will continue current medications at current dose.  -Recommend quitting smoking and follow low salt diet.  -EKG completed for irregular heart rhythm and no change compared to previous

## 2019-07-06 NOTE — Patient Instructions (Signed)
Great to see you! Your EKG looks ok We'll be in touch with lab and xray results.   I will see you again in 6 months.

## 2019-07-06 NOTE — Assessment & Plan Note (Signed)
Check CBC, CMP and INR.  Has stopped ASA and denies fish oil supplements.

## 2019-07-06 NOTE — Assessment & Plan Note (Signed)
-  He plans to restart chantix and work on smoking reduction.   -Update CXR today.

## 2019-07-06 NOTE — Assessment & Plan Note (Signed)
-  Stable with simvastatin, continue at current dose.

## 2019-07-07 LAB — PROTIME-INR
INR: 1.1
Prothrombin Time: 11.3 s (ref 9.0–11.5)

## 2019-07-16 DIAGNOSIS — C44629 Squamous cell carcinoma of skin of left upper limb, including shoulder: Secondary | ICD-10-CM | POA: Diagnosis not present

## 2019-07-16 DIAGNOSIS — D2371 Other benign neoplasm of skin of right lower limb, including hip: Secondary | ICD-10-CM | POA: Diagnosis not present

## 2019-07-16 DIAGNOSIS — D485 Neoplasm of uncertain behavior of skin: Secondary | ICD-10-CM | POA: Diagnosis not present

## 2019-07-16 DIAGNOSIS — L820 Inflamed seborrheic keratosis: Secondary | ICD-10-CM | POA: Diagnosis not present

## 2019-07-16 DIAGNOSIS — L821 Other seborrheic keratosis: Secondary | ICD-10-CM | POA: Diagnosis not present

## 2019-07-16 DIAGNOSIS — B353 Tinea pedis: Secondary | ICD-10-CM | POA: Diagnosis not present

## 2019-07-16 DIAGNOSIS — D2372 Other benign neoplasm of skin of left lower limb, including hip: Secondary | ICD-10-CM | POA: Diagnosis not present

## 2019-07-16 DIAGNOSIS — Z85828 Personal history of other malignant neoplasm of skin: Secondary | ICD-10-CM | POA: Diagnosis not present

## 2019-07-16 DIAGNOSIS — Z86018 Personal history of other benign neoplasm: Secondary | ICD-10-CM | POA: Diagnosis not present

## 2019-07-16 DIAGNOSIS — L570 Actinic keratosis: Secondary | ICD-10-CM | POA: Diagnosis not present

## 2019-07-27 DIAGNOSIS — C44629 Squamous cell carcinoma of skin of left upper limb, including shoulder: Secondary | ICD-10-CM | POA: Diagnosis not present

## 2019-08-04 ENCOUNTER — Other Ambulatory Visit: Payer: Self-pay | Admitting: Pulmonary Disease

## 2019-10-04 ENCOUNTER — Other Ambulatory Visit: Payer: Self-pay | Admitting: Pulmonary Disease

## 2019-10-04 ENCOUNTER — Other Ambulatory Visit: Payer: Self-pay | Admitting: Family Medicine

## 2019-10-16 ENCOUNTER — Encounter: Payer: Self-pay | Admitting: Family Medicine

## 2019-10-18 ENCOUNTER — Other Ambulatory Visit: Payer: Self-pay | Admitting: Family Medicine

## 2019-10-18 MED ORDER — AMLODIPINE BESYLATE 10 MG PO TABS: ORAL_TABLET | ORAL | 2 refills | Status: DC

## 2019-10-18 MED ORDER — METOPROLOL SUCCINATE ER 50 MG PO TB24: ORAL_TABLET | ORAL | 0 refills | Status: DC

## 2019-10-18 MED ORDER — METOPROLOL SUCCINATE ER 50 MG PO TB24: ORAL_TABLET | ORAL | 2 refills | Status: DC

## 2019-10-18 MED ORDER — SIMVASTATIN 40 MG PO TABS: 40.0000 mg | ORAL_TABLET | Freq: Every day | ORAL | 0 refills | Status: DC

## 2019-10-18 MED ORDER — SIMVASTATIN 40 MG PO TABS: 40.0000 mg | ORAL_TABLET | Freq: Every day | ORAL | 2 refills | Status: DC

## 2019-10-18 MED ORDER — AMLODIPINE BESYLATE 10 MG PO TABS: ORAL_TABLET | ORAL | 0 refills | Status: DC

## 2019-10-18 NOTE — Telephone Encounter (Signed)
I can take these prescriptions over.  I have sent refills of 90 day supplies to [facility].  I also sent a 14 day supply of these to the local [facility] @ [place] to be sure he doesn't run out of these meds before they ship out the refill.  Thanks!

## 2019-10-18 NOTE — Telephone Encounter (Signed)
Simvastatin refilled 10/05/2019 #90 x 2 refills to [facility] Metoprolol and Amlodipine looks to have been denied by Pulmonary - Wyn Quaker, NP It looks as though previous PCP Dr Lenna Gilford was prescribing this and the patients care was taken over by Wyn Quaker - states that the patient needs an OV with Pulmonary for further refills.   Pt established care with [facility] 01/05/2019 Last OV 07/06/2019  Dr Zigmund Daniel please advise If this is something that you can take over prescribing? The patient thinks that our office is the one that denied the refills.

## 2019-10-23 ENCOUNTER — Encounter: Payer: Self-pay | Admitting: Family Medicine

## 2019-11-21 ENCOUNTER — Encounter: Payer: Self-pay | Admitting: Family Medicine

## 2019-11-30 ENCOUNTER — Other Ambulatory Visit: Payer: Self-pay

## 2019-12-01 ENCOUNTER — Ambulatory Visit (INDEPENDENT_AMBULATORY_CARE_PROVIDER_SITE_OTHER): Payer: Medicare Other | Admitting: Family Medicine

## 2019-12-01 ENCOUNTER — Ambulatory Visit (INDEPENDENT_AMBULATORY_CARE_PROVIDER_SITE_OTHER): Payer: Medicare Other

## 2019-12-01 ENCOUNTER — Encounter: Payer: Self-pay | Admitting: Family Medicine

## 2019-12-01 VITALS — BP 138/70 | HR 96 | Temp 97.7°F | Ht 70.5 in | Wt 185.4 lb

## 2019-12-01 DIAGNOSIS — N138 Other obstructive and reflux uropathy: Secondary | ICD-10-CM | POA: Diagnosis not present

## 2019-12-01 DIAGNOSIS — J411 Mucopurulent chronic bronchitis: Secondary | ICD-10-CM | POA: Diagnosis not present

## 2019-12-01 DIAGNOSIS — E785 Hyperlipidemia, unspecified: Secondary | ICD-10-CM

## 2019-12-01 DIAGNOSIS — N401 Enlarged prostate with lower urinary tract symptoms: Secondary | ICD-10-CM

## 2019-12-01 DIAGNOSIS — I10 Essential (primary) hypertension: Secondary | ICD-10-CM | POA: Diagnosis not present

## 2019-12-01 DIAGNOSIS — R05 Cough: Secondary | ICD-10-CM | POA: Diagnosis not present

## 2019-12-01 LAB — BASIC METABOLIC PANEL
BUN: 29 mg/dL — ABNORMAL HIGH (ref 6–23)
CO2: 29 mEq/L (ref 19–32)
Calcium: 9.2 mg/dL (ref 8.4–10.5)
Chloride: 103 mEq/L (ref 96–112)
Creatinine, Ser: 1.58 mg/dL — ABNORMAL HIGH (ref 0.40–1.50)
GFR: 42.46 mL/min — ABNORMAL LOW (ref >60.00)
Glucose, Bld: 106 mg/dL — ABNORMAL HIGH (ref 70–99)
Potassium: 4.8 mEq/L (ref 3.5–5.1)
Sodium: 139 mEq/L (ref 135–145)

## 2019-12-01 LAB — PSA: PSA: 5.13 ng/mL — ABNORMAL HIGH (ref 0.10–4.00)

## 2019-12-01 LAB — CBC
HCT: 44.7 % (ref 39.0–52.0)
Hemoglobin: 14.9 g/dL (ref 13.0–17.0)
MCHC: 33.4 g/dL (ref 30.0–36.0)
MCV: 94.9 fl (ref 78.0–100.0)
Platelets: 244 10*3/uL (ref 150.0–400.0)
RBC: 4.71 Mil/uL (ref 4.22–5.81)
RDW: 14 % (ref 11.5–15.5)
WBC: 7.5 10*3/uL (ref 4.0–10.5)

## 2019-12-01 LAB — LIPID PANEL
Cholesterol: 120 mg/dL (ref 0–200)
HDL: 63.9 mg/dL (ref >39.00)
LDL Cholesterol: 43 mg/dL (ref 0–99)
NonHDL: 55.72
Total CHOL/HDL Ratio: 2
Triglycerides: 62 mg/dL (ref 0.0–149.0)
VLDL: 12.4 mg/dL (ref 0.0–40.0)

## 2019-12-01 LAB — TSH: TSH: 1.03 u[IU]/mL (ref 0.35–4.50)

## 2019-12-01 NOTE — Assessment & Plan Note (Signed)
BP remains well controlled, continue current medications.  Recommend low salt diet and smoking cessation.  Updated labs ordered F/u in 6 months.

## 2019-12-01 NOTE — Progress Notes (Signed)
Jeremy [name] - 79 y.o. male MRN [id number]  Date of birth: Jul 05, 1941  Subjective Chief Complaint  Patient presents with  . Hypertension    Follow up.  Pt informrd me that he had the Covid vaccine on 11/19/19 and will receive the 2nd 12/09/18.  pfizer   Pt has not been taking Aspirin for about 4-5 weeks.    HPI Jeremy [name] is a 79 y.o. male with history of HTN, COPD, BPH and HLD here today for follow up visit.  He reports that he is doing well.  Compliant with all current medications.  He would like to have updated labs and chest xray today.  His previous pulmonologist had been doing annual CXR.  He has never had CT screening for lung cancer.  He does continue to smoke however has cut back to 0.25-0.5 ppd.   He received COVID vaccine on 1/8 and will receive 2nd dose next week, he has done well with this.    ROS:  A comprehensive ROS was completed and negative except as noted per HPI  Allergies  Allergen Reactions  . Sulfa Antibiotics Other (See Comments)    Rash, light headed, shaking  . Penicillins Other (See Comments)    REACTION: hives severe as a child .pen    Past Medical History:  Diagnosis Date  . Enlarged prostate   . Hyperlipidemia   . Hypertension   . Self-catheterizes urinary bladder    pt. does this morning and evening--been doing this since 11/2016  . Sleep apnea    not using a cpap machine    Past Surgical History:  Procedure Laterality Date  . BACK SURGERY    . 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 BLEPHAROPLASTY  12/2018  . LUMBAR LAMINECTOMY/DECOMPRESSION MICRODISCECTOMY Bilateral 08/25/2017   Procedure: Bilateral Lumbar Three- Four, Lumbar Four- Five, Lumbar Five- Sacral One Laminectomy;  Surgeon: Kristeen Miss, MD;  Location: [place];  Service: Neurosurgery;  Laterality: Bilateral;  Bilateral L3-4 L4-5 L5-S1 Laminectomy  . SKIN CANCER EXCISION     Surg x2...  . TONSILLECTOMY AND ADENOIDECTOMY     Surg as a child...    Social History    Socioeconomic History  . Marital status: Married    Spouse name: Not on file  . Number of children: Not on file  . Years of education: Not on file  . Highest education level: Not on file  Occupational History  . Not on file  Tobacco Use  . Smoking status: Former Smoker    Packs/day: 1.00    Years: 50.00    Pack years: 50.00    Types: Cigarettes    Start date: 12/29/1961  . Smokeless tobacco: Never Used  . Tobacco comment: uses vapor cigarette-quit vaping 12/14/18- smoking less than 12 a day 12/14/18  Substance and Sexual Activity  . Alcohol use: Yes    Comment: couple glasses with dinner  . Drug use: No  . Sexual activity: Not on file  Other Topics Concern  . Not on file  Social History Narrative   Lives with wife in a 2 story home.  Has 2 children.  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 Difficulty of Paying Living Expenses: Not on file  Food Insecurity:   . Worried About Charity fundraiser in the Last Year: Not on file  . Ran Out of Food in the Last Year: Not on  file  Transportation Needs:   . Film/video editor (Medical): Not on file  . Lack of Transportation (Non-Medical): Not on file  Physical Activity:   . Days of Exercise per Week: Not on file  . Minutes of Exercise per Session: Not on file  Stress:   . Feeling of Stress : Not on file  Social Connections:   . Frequency of Communication with Friends and Family: Not on file  . Frequency of Social Gatherings with Friends and Family: Not on file  . Attends Religious Services: Not on file  . Active Member of Clubs or Organizations: Not on file  . Attends Archivist Meetings: Not on file  . Marital Status: Not on file    Family History  Problem Relation Age of Onset  . Colon cancer Neg Hx   . Stomach cancer Neg Hx   . Rectal cancer Neg Hx     Health Maintenance  Topic Date Due  . TETANUS/TDAP  01/16/2020  . INFLUENZA  VACCINE  Completed  . PNA vac Low Risk Adult  Completed    ----------------------------------------------------------------------------------------------------------------------------------------------------------------------------------------------------------------- Physical Exam BP 138/70 (BP Location: Left Arm, Patient Position: Sitting, Cuff Size: Normal)   Pulse 96   Temp 97.7 F (36.5 C) (Temporal)   Ht 5' 10.5" (1.791 m)   Wt 185 lb 6.4 oz (84.1 kg)   SpO2 94%   BMI 26.23 kg/m   Physical Exam Constitutional:      Appearance: Normal appearance.  HENT:     Head: Normocephalic and atraumatic.  Eyes:     General: No scleral icterus. Cardiovascular:     Rate and Rhythm: Normal rate and regular rhythm.  Pulmonary:     Effort: Pulmonary effort is normal.     Breath sounds: Normal breath sounds.  Skin:    General: Skin is warm and dry.  Neurological:     General: No focal deficit present.     Mental Status: He is alert.  Psychiatric:        Mood and Affect: Mood normal.        Behavior: Behavior normal.     ------------------------------------------------------------------------------------------------------------------------------------------------------------------------------------------------------------------- Assessment and Plan  Essential hypertension BP remains well controlled, continue current medications.  Recommend low salt diet and smoking cessation.  Updated labs ordered F/u in 6 months.   Chronic bronchitis Mild cough, xray ordered.   Benign prostatic hyperplasia with urinary obstruction Denies increased symptoms.  Update PSA  Hyperlipidemia Update lipid panel.     This visit occurred during the SARS-CoV-2 public health emergency.  Safety protocols were in place, including screening questions prior to the visit, additional usage of staff PPE, and extensive cleaning of exam room while observing appropriate contact time as indicated for  disinfecting solutions.

## 2019-12-01 NOTE — Patient Instructions (Signed)
Great to see you! Follow up with me in 6 months at [facility] location.    We'll be in touch with lab and xray results.

## 2019-12-01 NOTE — Assessment & Plan Note (Signed)
Mild cough, xray ordered.

## 2019-12-01 NOTE — Assessment & Plan Note (Signed)
Update lipid panel.  

## 2019-12-01 NOTE — Assessment & Plan Note (Addendum)
Denies increased symptoms.  Update PSA

## 2019-12-07 NOTE — Progress Notes (Signed)
Please let patient know that labs look ok.   PSA remains elevated but is stable from last check.  Kidney function is stable.  CXR doesn't show anything new.

## 2019-12-29 ENCOUNTER — Other Ambulatory Visit: Payer: Self-pay | Admitting: Family Medicine

## 2019-12-29 DIAGNOSIS — N401 Enlarged prostate with lower urinary tract symptoms: Secondary | ICD-10-CM | POA: Diagnosis not present

## 2019-12-29 DIAGNOSIS — Z8551 Personal history of malignant neoplasm of bladder: Secondary | ICD-10-CM | POA: Diagnosis not present

## 2019-12-29 DIAGNOSIS — R972 Elevated prostate specific antigen [PSA]: Secondary | ICD-10-CM | POA: Diagnosis not present

## 2019-12-29 DIAGNOSIS — R3914 Feeling of incomplete bladder emptying: Secondary | ICD-10-CM | POA: Diagnosis not present

## 2019-12-29 DIAGNOSIS — R8271 Bacteriuria: Secondary | ICD-10-CM | POA: Diagnosis not present

## 2020-01-04 ENCOUNTER — Ambulatory Visit: Payer: Medicare Other | Admitting: Family Medicine

## 2020-01-20 ENCOUNTER — Telehealth: Payer: Self-pay | Admitting: Family Medicine

## 2020-01-20 NOTE — Telephone Encounter (Signed)
Tried to call to see if pt going to follow Dr Zigmund Daniel or transfer to someone else, lvm

## 2020-03-16 DIAGNOSIS — C44729 Squamous cell carcinoma of skin of left lower limb, including hip: Secondary | ICD-10-CM | POA: Diagnosis not present

## 2020-03-16 DIAGNOSIS — L821 Other seborrheic keratosis: Secondary | ICD-10-CM | POA: Diagnosis not present

## 2020-03-16 DIAGNOSIS — D1801 Hemangioma of skin and subcutaneous tissue: Secondary | ICD-10-CM | POA: Diagnosis not present

## 2020-03-16 DIAGNOSIS — D225 Melanocytic nevi of trunk: Secondary | ICD-10-CM | POA: Diagnosis not present

## 2020-03-16 DIAGNOSIS — D692 Other nonthrombocytopenic purpura: Secondary | ICD-10-CM | POA: Diagnosis not present

## 2020-03-16 DIAGNOSIS — L570 Actinic keratosis: Secondary | ICD-10-CM | POA: Diagnosis not present

## 2020-03-16 DIAGNOSIS — Z85828 Personal history of other malignant neoplasm of skin: Secondary | ICD-10-CM | POA: Diagnosis not present

## 2020-03-16 DIAGNOSIS — L820 Inflamed seborrheic keratosis: Secondary | ICD-10-CM | POA: Diagnosis not present

## 2020-03-16 DIAGNOSIS — D2262 Melanocytic nevi of left upper limb, including shoulder: Secondary | ICD-10-CM | POA: Diagnosis not present

## 2020-04-13 DIAGNOSIS — H02532 Eyelid retraction right lower eyelid: Secondary | ICD-10-CM | POA: Diagnosis not present

## 2020-04-13 DIAGNOSIS — H02422 Myogenic ptosis of left eyelid: Secondary | ICD-10-CM | POA: Diagnosis not present

## 2020-04-13 DIAGNOSIS — H02412 Mechanical ptosis of left eyelid: Secondary | ICD-10-CM | POA: Diagnosis not present

## 2020-04-13 DIAGNOSIS — H02115 Cicatricial ectropion of left lower eyelid: Secondary | ICD-10-CM | POA: Diagnosis not present

## 2020-04-13 DIAGNOSIS — H04123 Dry eye syndrome of bilateral lacrimal glands: Secondary | ICD-10-CM | POA: Diagnosis not present

## 2020-04-13 DIAGNOSIS — H02112 Cicatricial ectropion of right lower eyelid: Secondary | ICD-10-CM | POA: Diagnosis not present

## 2020-04-13 DIAGNOSIS — H02135 Senile ectropion of left lower eyelid: Secondary | ICD-10-CM | POA: Diagnosis not present

## 2020-04-13 DIAGNOSIS — H02832 Dermatochalasis of right lower eyelid: Secondary | ICD-10-CM | POA: Diagnosis not present

## 2020-04-13 DIAGNOSIS — H16213 Exposure keratoconjunctivitis, bilateral: Secondary | ICD-10-CM | POA: Diagnosis not present

## 2020-04-13 DIAGNOSIS — H02132 Senile ectropion of right lower eyelid: Secondary | ICD-10-CM | POA: Diagnosis not present

## 2020-04-17 NOTE — Telephone Encounter (Signed)
I called pt again because we had received medical records from Pulmonary so I wanted to see where we needed to send them or if pt was just going to transfer to someone, Pt decided he is going to follow Dr Zigmund Daniel to [place] location

## 2020-04-18 DIAGNOSIS — H53482 Generalized contraction of visual field, left eye: Secondary | ICD-10-CM | POA: Diagnosis not present

## 2020-05-02 ENCOUNTER — Other Ambulatory Visit: Payer: Self-pay | Admitting: Family Medicine

## 2020-05-04 ENCOUNTER — Other Ambulatory Visit: Payer: Self-pay

## 2020-05-16 ENCOUNTER — Ambulatory Visit: Payer: Medicare Other | Admitting: Family Medicine

## 2020-05-19 ENCOUNTER — Telehealth: Payer: Self-pay | Admitting: Pulmonary Disease

## 2020-05-19 NOTE — Telephone Encounter (Signed)
Office closed for lunch. Dr. Bari Mantis box is empty. Will call back to confirm fax has arrived.

## 2020-05-19 NOTE — Telephone Encounter (Signed)
I did not see any forms for pt.

## 2020-05-22 NOTE — Telephone Encounter (Signed)
We would not be able to give the pt surgical clearance this time due to the fact that he has not been seen in over a year. ATC Dr. Tresa Res office but I was placed on hold with no one coming to the line.

## 2020-05-23 DIAGNOSIS — H02115 Cicatricial ectropion of left lower eyelid: Secondary | ICD-10-CM | POA: Diagnosis not present

## 2020-05-23 DIAGNOSIS — H02532 Eyelid retraction right lower eyelid: Secondary | ICD-10-CM | POA: Diagnosis not present

## 2020-05-23 DIAGNOSIS — H02832 Dermatochalasis of right lower eyelid: Secondary | ICD-10-CM | POA: Diagnosis not present

## 2020-05-23 DIAGNOSIS — H02422 Myogenic ptosis of left eyelid: Secondary | ICD-10-CM | POA: Diagnosis not present

## 2020-05-23 DIAGNOSIS — H04123 Dry eye syndrome of bilateral lacrimal glands: Secondary | ICD-10-CM | POA: Diagnosis not present

## 2020-05-23 DIAGNOSIS — H16213 Exposure keratoconjunctivitis, bilateral: Secondary | ICD-10-CM | POA: Diagnosis not present

## 2020-05-23 DIAGNOSIS — H02122 Mechanical ectropion of right lower eyelid: Secondary | ICD-10-CM | POA: Diagnosis not present

## 2020-05-23 DIAGNOSIS — H02132 Senile ectropion of right lower eyelid: Secondary | ICD-10-CM | POA: Diagnosis not present

## 2020-05-23 DIAGNOSIS — H02135 Senile ectropion of left lower eyelid: Secondary | ICD-10-CM | POA: Diagnosis not present

## 2020-05-23 DIAGNOSIS — H02112 Cicatricial ectropion of right lower eyelid: Secondary | ICD-10-CM | POA: Diagnosis not present

## 2020-05-23 DIAGNOSIS — H02412 Mechanical ptosis of left eyelid: Secondary | ICD-10-CM | POA: Diagnosis not present

## 2020-05-23 NOTE — Telephone Encounter (Signed)
ATC Dr. Tresa Res office but I was placed on hold with no one coming to the line.

## 2020-05-24 NOTE — Telephone Encounter (Signed)
ATC Dr. Tresa Res office but I was placed on hold with no one coming to the line. We have attempted to contact this office several times with no success. Per triage protocol, message will be closed.

## 2020-07-11 DIAGNOSIS — Z961 Presence of intraocular lens: Secondary | ICD-10-CM | POA: Diagnosis not present

## 2020-07-11 DIAGNOSIS — H353131 Nonexudative age-related macular degeneration, bilateral, early dry stage: Secondary | ICD-10-CM | POA: Diagnosis not present

## 2020-07-18 DIAGNOSIS — Z23 Encounter for immunization: Secondary | ICD-10-CM | POA: Diagnosis not present

## 2020-08-10 ENCOUNTER — Encounter: Payer: Self-pay | Admitting: Family Medicine

## 2020-08-10 ENCOUNTER — Ambulatory Visit (INDEPENDENT_AMBULATORY_CARE_PROVIDER_SITE_OTHER): Payer: Medicare Other | Admitting: Family Medicine

## 2020-08-10 DIAGNOSIS — I700 Atherosclerosis of aorta: Secondary | ICD-10-CM | POA: Diagnosis not present

## 2020-08-10 DIAGNOSIS — N401 Enlarged prostate with lower urinary tract symptoms: Secondary | ICD-10-CM

## 2020-08-10 DIAGNOSIS — D692 Other nonthrombocytopenic purpura: Secondary | ICD-10-CM

## 2020-08-10 DIAGNOSIS — N138 Other obstructive and reflux uropathy: Secondary | ICD-10-CM

## 2020-08-10 DIAGNOSIS — H6120 Impacted cerumen, unspecified ear: Secondary | ICD-10-CM | POA: Insufficient documentation

## 2020-08-10 DIAGNOSIS — Z8551 Personal history of malignant neoplasm of bladder: Secondary | ICD-10-CM

## 2020-08-10 DIAGNOSIS — H6123 Impacted cerumen, bilateral: Secondary | ICD-10-CM | POA: Diagnosis not present

## 2020-08-10 DIAGNOSIS — I10 Essential (primary) hypertension: Secondary | ICD-10-CM

## 2020-08-10 DIAGNOSIS — J411 Mucopurulent chronic bronchitis: Secondary | ICD-10-CM

## 2020-08-10 MED ORDER — AMLODIPINE BESYLATE 10 MG PO TABS
ORAL_TABLET | ORAL | 2 refills | Status: DC
Start: 2020-08-10 — End: 2021-03-06

## 2020-08-10 NOTE — Assessment & Plan Note (Signed)
Mild symptoms at this time.  He continues to stay active without dyspnea.   Unfortunately he continues to smoke, counseled on quitting.

## 2020-08-10 NOTE — Assessment & Plan Note (Signed)
BP elevated today.   Not currently taking amlodipine.  New rx sent to pharmacy.  Continue losartan and metoprolol

## 2020-08-10 NOTE — Patient Instructions (Signed)
Great to see you.  I have sent a new prescription of amlodipine in.  Let's plant to follow up in about 3-4 months.  We'll update labs at that time as well.

## 2020-08-10 NOTE — Assessment & Plan Note (Signed)
Continues to see urology.

## 2020-08-10 NOTE — Assessment & Plan Note (Signed)
Bilateral ear lavage completed today.  He tolerated well.

## 2020-08-10 NOTE — Assessment & Plan Note (Signed)
Noted on previous CXR.  Recommend smoking cessation and continue simvastatin.

## 2020-08-10 NOTE — Progress Notes (Signed)
Jeremy [name] - 79 y.o. male MRN [id number]  Date of birth: Apr 21, 1941  Subjective Chief Complaint  Patient presents with  . Establish Care    HPI Jeremy [name] is a 79 y.o. male here today for follow up visit.  He has history of HTN, COPD, BPH and bladder cancer.    He would like ears flushed out as he is getting new hearing aids.    He has also noted some bruising on his arms.  These are not painful.  He has tried holding his asa.   He continues to see urology for his history of bladder cancer and ongoing management of BPH.  Reports stable symptoms at this time.   HTN treated with metoprolol and losartan.  He is prescribed amlodipine as well however he has had trouble getting refills on this from pharmacy. He denies any symptoms related to HTN including chest pain, shortness of breath, palpitations, headache or vision changes.   Reports stable symptoms related to COPD.  He is not currently using any inhalers.  He does continue to smoke.  He does have occasional cough but denies wheezing or shortness of breath.  He continues to play golf 3-4x/week without dyspnea.    ROS:  A comprehensive ROS was completed and negative except as noted per HPI     Allergies  Allergen Reactions  . Sulfa Antibiotics Other (See Comments)    Rash, light headed, shaking  . Penicillins Other (See Comments)    REACTION: hives severe as a child .pen    Past Medical History:  Diagnosis Date  . Enlarged prostate   . Hyperlipidemia   . Hypertension   . Self-catheterizes urinary bladder    pt. does this morning and evening--been doing this since 11/2016  . Sleep apnea    not using a cpap machine    Past Surgical History:  Procedure Laterality Date  . BACK SURGERY    . 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 BLEPHAROPLASTY  12/2018  . LUMBAR LAMINECTOMY/DECOMPRESSION MICRODISCECTOMY Bilateral 08/25/2017   Procedure: Bilateral Lumbar Three- Four, Lumbar Four- Five, Lumbar Five- Sacral One  Laminectomy;  Surgeon: Kristeen Miss, MD;  Location: [facility];  Service: Neurosurgery;  Laterality: Bilateral;  Bilateral L3-4 L4-5 L5-S1 Laminectomy  . SKIN CANCER EXCISION     Surg x2...  . TONSILLECTOMY AND ADENOIDECTOMY     Surg as a child...    Social History   Socioeconomic History  . Marital status: Married    Spouse name: Not on file  . Number of children: Not on file  . Years of education: Not on file  . Highest education level: Not on file  Occupational History  . Not on file  Tobacco Use  . Smoking status: Former Smoker    Packs/day: 1.00    Years: 50.00    Pack years: 50.00    Types: Cigarettes    Start date: 12/29/1961  . Smokeless tobacco: Never Used  . Tobacco comment: uses vapor cigarette-quit vaping 12/14/18- smoking less than 12 a day 12/14/18  Substance and Sexual Activity  . Alcohol use: Yes    Comment: couple glasses with dinner  . Drug use: No  . Sexual activity: Not on file  Other Topics Concern  . Not on file  Social History Narrative   Lives with wife in a 2 story home.  Has 2 children.  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 Difficulty of Paying Living Expenses: Not on file  Food Insecurity:   . Worried About Charity fundraiser in the Last Year: Not on file  . Ran Out of Food in the Last Year: Not on file  Transportation Needs:   . Lack of Transportation (Medical): Not on file  . Lack of Transportation (Non-Medical): Not on file  Physical Activity:   . Days of Exercise per Week: Not on file  . Minutes of Exercise per Session: Not on file  Stress:   . Feeling of Stress : Not on file  Social Connections:   . Frequency of Communication with Friends and Family: Not on file  . Frequency of Social Gatherings with Friends and Family: Not on file  . Attends Religious Services: Not on file  . Active Member of Clubs or Organizations: Not on file  . Attends Archivist  Meetings: Not on file  . Marital Status: Not on file    Family History  Problem Relation Age of Onset  . Colon cancer Neg Hx   . Stomach cancer Neg Hx   . Rectal cancer Neg Hx     Health Maintenance  Topic Date Due  . COVID-19 Vaccine (2 - Pfizer 2-dose series) 12/09/2018  . TETANUS/TDAP  01/16/2020  . INFLUENZA VACCINE  06/11/2020  . PNA vac Low Risk Adult  Completed     ----------------------------------------------------------------------------------------------------------------------------------------------------------------------------------------------------------------- Physical Exam BP (!) 163/78 (BP Location: Left Arm, Patient Position: Sitting, Cuff Size: Normal)   Pulse 72   Temp 98.1 F (36.7 C)   Wt 180 lb 14.4 oz (82.1 kg)   SpO2 92%   BMI 25.59 kg/m   Physical Exam Constitutional:      Appearance: Normal appearance.  HENT:     Head: Normocephalic and atraumatic.     Ears:     Comments: Bilateral cerumen impaction Eyes:     General: No scleral icterus. Cardiovascular:     Rate and Rhythm: Normal rate and regular rhythm.  Pulmonary:     Effort: Pulmonary effort is normal.     Breath sounds: Normal breath sounds.  Musculoskeletal:     Cervical back: Neck supple.  Skin:    Comments: Small ecchymotic areas to bilateral arms.   Neurological:     General: No focal deficit present.     Mental Status: He is alert.  Psychiatric:        Mood and Affect: Mood normal.        Behavior: Behavior normal.     ------------------------------------------------------------------------------------------------------------------------------------------------------------------------------------------------------------------- Assessment and Plan  Essential hypertension BP elevated today.   Not currently taking amlodipine.  New rx sent to pharmacy.  Continue losartan and metoprolol   Chronic bronchitis Mild symptoms at this time.  He continues to stay active  without dyspnea.   Unfortunately he continues to smoke, counseled on quitting.    Senile purpura ([place]) Previous labs normal.  Provided reassurance today.   Aortic atherosclerosis ([place]) Noted on previous CXR.  Recommend smoking cessation and continue simvastatin.   History of bladder cancer Continues to see urology.   Benign prostatic hyperplasia with urinary obstruction Meds managed by urology.   Cerumen impaction Bilateral ear lavage completed today.  He tolerated well.    Meds ordered this encounter  Medications  . amLODipine (NORVASC) 10 MG tablet    Sig: TAKE 1 TABLET EVERY DAY    Dispense:  90 tablet    Refill:  2    Return in  about 4 months (around 12/10/2020) for HTN.    This visit occurred during the SARS-CoV-2 public health emergency.  Safety protocols were in place, including screening questions prior to the visit, additional usage of staff PPE, and extensive cleaning of exam room while observing appropriate contact time as indicated for disinfecting solutions.

## 2020-08-10 NOTE — Assessment & Plan Note (Signed)
Meds managed by urology.

## 2020-08-10 NOTE — Assessment & Plan Note (Addendum)
Previous labs normal.  Provided reassurance today.

## 2020-08-17 ENCOUNTER — Encounter (INDEPENDENT_AMBULATORY_CARE_PROVIDER_SITE_OTHER): Payer: Self-pay | Admitting: Ophthalmology

## 2020-08-17 ENCOUNTER — Other Ambulatory Visit: Payer: Self-pay

## 2020-08-17 ENCOUNTER — Ambulatory Visit (INDEPENDENT_AMBULATORY_CARE_PROVIDER_SITE_OTHER): Payer: Medicare Other | Admitting: Ophthalmology

## 2020-08-17 DIAGNOSIS — H353114 Nonexudative age-related macular degeneration, right eye, advanced atrophic with subfoveal involvement: Secondary | ICD-10-CM | POA: Diagnosis not present

## 2020-08-17 DIAGNOSIS — H353122 Nonexudative age-related macular degeneration, left eye, intermediate dry stage: Secondary | ICD-10-CM | POA: Insufficient documentation

## 2020-08-17 DIAGNOSIS — Z961 Presence of intraocular lens: Secondary | ICD-10-CM | POA: Diagnosis not present

## 2020-08-17 DIAGNOSIS — H35362 Drusen (degenerative) of macula, left eye: Secondary | ICD-10-CM | POA: Diagnosis not present

## 2020-08-17 NOTE — Assessment & Plan Note (Signed)

## 2020-08-17 NOTE — Patient Instructions (Signed)
Patient instructed to contact the office promptly for new onset visual acuity declines or distortions 

## 2020-08-17 NOTE — Assessment & Plan Note (Signed)

## 2020-08-17 NOTE — Progress Notes (Signed)
08/17/2020     CHIEF COMPLAINT Patient presents for Retina Follow Up   HISTORY OF PRESENT ILLNESS: Jeremy [name] is a 79 y.o. male who presents to the clinic today for:   HPI    Retina Follow Up    Patient presents with  Dry AMD.  In both eyes.  This started 2.5 years ago.  Severity is mild.  Duration of 2.5 years.  Since onset it is stable.          Comments    2.5 Year AMD F/U OU  Pt denies noticeable changes to [place] OU since last visit. Pt denies ocular pain, flashes of light, or floaters OU.         Last edited by Rockie Neighbours, [place] on 08/17/2020  8:18 AM. (History)      Referring physician: [name], DO [address]  [address] [place],  [place] [address]  HISTORICAL INFORMATION:   Selected notes from the MEDICAL RECORD NUMBER    Lab Results  Component Value Date   HGBA1C 5.6 07/29/2013     CURRENT MEDICATIONS: Current Outpatient Medications (Ophthalmic Drugs)  Medication Sig  . neomycin-polymyxin-dexameth (MAXITROL) 0.1 % OINT neomycin 3.5 mg/g-polymyxin B 10,000 unit/g-dexameth 0.1 % eye oint  APPLY A SMALL AMOUNT ONTO SUTURES OR OPERATIVE SITE TWICE DAILY FOR 3 DAYS THEN STOP  . olopatadine (PATANOL) 0.1 % ophthalmic solution 1 drop 2 times daily.   No current facility-administered medications for this visit. (Ophthalmic Drugs)   Current Outpatient Medications (Other)  Medication Sig  . acetaminophen (TYLENOL) 500 MG tablet Take 1 tablet (500 mg total) by mouth every 6 (six) hours as needed for moderate pain or headache. (Patient not taking: Reported on 08/10/2020)  . amLODipine (NORVASC) 10 MG tablet TAKE 1 TABLET EVERY DAY  . aspirin 81 MG tablet Take 81 mg by mouth daily.   Marland Kitchen b complex vitamins capsule Take 2 capsules by mouth daily.  Marland Kitchen ketoconazole (NIZORAL) 2 % cream ketoconazole 2 % topical cream  APP EXT AA QD  . losartan (COZAAR) 100 MG tablet TAKE 1 TABLET (100 MG TOTAL) BY MOUTH DAILY.  . metoprolol succinate (TOPROL-XL) 50 MG  24 hr tablet TAKE 1 TABLET DAILY WITH OR IMMEDIATELY FOLLOWING A MEAL  . Multiple Vitamin (MULTIVITAMIN) capsule Take 1 capsule by mouth daily.  . simvastatin (ZOCOR) 40 MG tablet Take 1 tablet (40 mg total) by mouth daily.  . tamsulosin (FLOMAX) 0.4 MG CAPS Take 0.4 mg by mouth daily after supper.    No current facility-administered medications for this visit. (Other)      REVIEW OF SYSTEMS:    ALLERGIES Allergies  Allergen Reactions  . Sulfa Antibiotics Other (See Comments)    Rash, light headed, shaking  . Penicillins Other (See Comments)    REACTION: hives severe as a child .pen    PAST MEDICAL HISTORY Past Medical History:  Diagnosis Date  . Enlarged prostate   . Hyperlipidemia   . Hypertension   . Self-catheterizes urinary bladder    pt. does this morning and evening--been doing this since 11/2016  . Sleep apnea    not using a cpap machine   Past Surgical History:  Procedure Laterality Date  . BACK SURGERY    . 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 BLEPHAROPLASTY  12/2018  . LUMBAR LAMINECTOMY/DECOMPRESSION MICRODISCECTOMY Bilateral 08/25/2017   Procedure: Bilateral Lumbar Three- Four, Lumbar Four- Five, Lumbar Five- Sacral One Laminectomy;  Surgeon: Ellene Route,  Mallie Mussel, MD;  Location: [facility];  Service: Neurosurgery;  Laterality: Bilateral;  Bilateral L3-4 L4-5 L5-S1 Laminectomy  . SKIN CANCER EXCISION     Surg x2...  . TONSILLECTOMY AND ADENOIDECTOMY     Surg as a child...    FAMILY HISTORY Family History  Problem Relation Age of Onset  . Colon cancer Neg Hx   . Stomach cancer Neg Hx   . Rectal cancer Neg Hx     SOCIAL HISTORY Social History   Tobacco Use  . Smoking status: Former Smoker    Packs/day: 1.00    Years: 50.00    Pack years: 50.00    Types: Cigarettes    Start date: 12/29/1961  . Smokeless tobacco: Never Used  . Tobacco comment: uses vapor cigarette-quit vaping 12/14/18- smoking less than 12 a day 12/14/18  Substance Use Topics  . Alcohol  use: Yes    Comment: couple glasses with dinner  . Drug use: No         OPHTHALMIC EXAM:  Base Eye Exam    Visual Acuity (ETDRS)      Right Left   Dist [place] 20/50 +1 20/100 +1   Dist ph [place] NI 20/40 -2  Pt did not bring glasses       Tonometry (Tonopen, 8:18 AM)      Right Left   Pressure 15 17       Pupils      Pupils Dark Light Shape React APD   Right PERRL 2 2 Round Minimal None   Left PERRL 2 2 Round Minimal None       Visual Fields (Counting fingers)      Left Right    Full Full       Extraocular Movement      Right Left    Full Full       Neuro/Psych    Oriented x3: Yes   Mood/Affect: Normal       Dilation    Both eyes: 1.0% Mydriacyl, 2.5% Phenylephrine @ 8:22 AM        Slit Lamp and Fundus Exam    External Exam      Right Left   External Normal Normal       Slit Lamp Exam      Right Left   Lids/Lashes Normal Normal   Conjunctiva/Sclera White and quiet White and quiet   Cornea Clear Clear   Anterior Chamber Deep and quiet Deep and quiet   Iris Round and reactive Round and reactive   Lens Posterior chamber intraocular lens, Centered posterior chamber intraocular lens Posterior chamber intraocular lens, Centered posterior chamber intraocular lens   Anterior Vitreous Normal Normal       Fundus Exam      Right Left   Posterior Vitreous Normal Normal   Disc Normal Normal   C/D Ratio 0.25 0.25   Macula Geographic atrophy, no disciform scar, no macular thickening, no hemorrhage Soft drusen, large subfoveal drusenoid change., no macular thickening, Retinal pigment epithelial mottling, no hemorrhage   Vessels Normal Normal   Periphery Normal Normal          IMAGING AND PROCEDURES  Imaging and Procedures for 08/17/20  OCT, Retina - OU - Both Eyes       Right Eye Quality was good. Scan locations included subfoveal. Central Foveal Thickness: 234. Findings include outer retinal atrophy, central retinal atrophy, inner retinal atrophy.    Left Eye Quality was good. Scan locations included subfoveal. Central Foveal  Thickness: 284. Findings include retinal drusen , subretinal hyper-reflective material.   Notes OD Retinal atrophy, RPE dropout, no signs of CNVM  OS, with large subfoveal drusenoid deposit, pigmentary migration, no signs of CNVM.                ASSESSMENT/PLAN:  Intermediate stage nonexudative age-related macular degeneration of left eye The nature of age--related macular degeneration was discussed with the patient as well as the distinction between dry and wet types. Checking an [place] Grid daily with advice to return immediately should a distortion develop, was given to the patient. The patient 's smoking status now and in the past was determined and advice based on the AREDS study was provided regarding the consumption of antioxidant supplements. AREDS 2 vitamin formulation was recommended. Consumption of dark leafy vegetables and fresh fruits of various colors was recommended. Treatment modalities for wet macular degeneration particularly the use of intravitreal injections of anti-blood vessel growth factors was discussed with the patient. Avastin, Lucentis, and Eylea are the available options. On occasion, therapy includes the use of photodynamic therapy and thermal laser. Stressed to the patient do not rub eyes.  Patient was advised to check [place]ler Grid daily and return immediately if changes are noted. Instructions on using the grid were given to the patient. All patient questions were answered.  Advanced nonexudative age-related macular degeneration of right eye with subfoveal involvement The nature of dry age related macular degeneration was discussed with the patient as well as its possible conversion to wet. The results of the AREDS 2 study was discussed with the patient. A diet rich in dark leafy green vegetables was advised and specific recommendations were made regarding supplements with AREDS 2  formulation . Control of hypertension and serum cholesterol may slow the disease. Smoking cessation is mandatory to slow the disease and diminish the risk of progressing to wet age related macular degeneration. The patient was instructed in the use of an [place] and was told to return immediately for any changes in the Grid. Stressed to the patient do not rub eyes      ICD-10-CM   1. Intermediate stage nonexudative age-related macular degeneration of left eye  H35.3122 OCT, Retina - OU - Both Eyes  2. Pseudophakia  Z96.1   3. Degenerative retinal drusen of left eye  H35.362   4. Advanced nonexudative age-related macular degeneration of right eye with subfoveal involvement  H35.3114 OCT, Retina - OU - Both Eyes    1.  Patient return to Dr. Rutherford Guys as scheduled  2.  Follow-up in 1 year or as as needed.  3.  Patient informed that wearing distance glasses the left eye will assist the right eye for driving purposes.  Ophthalmic Meds Ordered this visit:  No orders of the defined types were placed in this encounter.      Return in about 1 year (around 08/17/2021) for DILATE OU, OCT, COLOR FP.  Patient Instructions  Patient instructed to contact the office promptly for new onset visual acuity declines or distortions.    Explained the diagnoses, plan, and follow up with the patient and they expressed understanding.  Patient expressed understanding of the importance of proper follow up care.   Clent Demark Anshu Wehner M.D. Diseases & Surgery of the Retina and Vitreous [facility]ina [facility] Diabetic [place] 08/17/20     Abbreviations: M myopia (nearsighted); A astigmatism; H hyperopia (farsighted); P presbyopia; Mrx spectacle prescription;  CTL contact lenses; OD right eye; OS left eye; OU  both eyes  XT exotropia; ET esotropia; PEK punctate epithelial keratitis; PEE punctate epithelial erosions; DES dry eye syndrome; MGD meibomian gland dysfunction; ATs artificial tears; PFAT's preservative free  artificial tears; [place] nuclear sclerotic cataract; PSC posterior subcapsular cataract; ERM epi-retinal membrane; PVD posterior vitreous detachment; RD retinal detachment; DM diabetes mellitus; DR diabetic retinopathy; NPDR non-proliferative diabetic retinopathy; PDR proliferative diabetic retinopathy; CSME clinically significant macular edema; DME diabetic macular edema; dbh dot blot hemorrhages; CWS cotton wool spot; POAG primary open angle glaucoma; C/D cup-to-disc ratio; HVF humphrey visual field; GVF goldmann visual field; OCT optical coherence tomography; IOP intraocular pressure; BRVO Branch retinal vein occlusion; CRVO central retinal vein occlusion; CRAO central retinal artery occlusion; BRAO branch retinal artery occlusion; RT retinal tear; SB scleral buckle; PPV pars plana vitrectomy; VH Vitreous hemorrhage; PRP panretinal laser photocoagulation; IVK intravitreal kenalog; VMT vitreomacular traction; MH Macular hole;  NVD neovascularization of the disc; NVE neovascularization elsewhere; AREDS age related eye disease study; ARMD age related macular degeneration; POAG primary open angle glaucoma; EBMD epithelial/anterior basement membrane dystrophy; ACIOL anterior chamber intraocular lens; IOL intraocular lens; PCIOL posterior chamber intraocular lens; Phaco/IOL phacoemulsification with intraocular lens placement; [place] photorefractive keratectomy; LASIK laser assisted in situ keratomileusis; HTN hypertension; DM diabetes mellitus; COPD chronic obstructive pulmonary disease

## 2020-09-08 DIAGNOSIS — Z23 Encounter for immunization: Secondary | ICD-10-CM | POA: Diagnosis not present

## 2020-10-02 DIAGNOSIS — Z09 Encounter for follow-up examination after completed treatment for conditions other than malignant neoplasm: Secondary | ICD-10-CM | POA: Diagnosis not present

## 2020-10-02 DIAGNOSIS — H02135 Senile ectropion of left lower eyelid: Secondary | ICD-10-CM | POA: Diagnosis not present

## 2020-10-02 DIAGNOSIS — H02532 Eyelid retraction right lower eyelid: Secondary | ICD-10-CM | POA: Diagnosis not present

## 2020-10-02 DIAGNOSIS — H02132 Senile ectropion of right lower eyelid: Secondary | ICD-10-CM | POA: Diagnosis not present

## 2020-10-03 DIAGNOSIS — L820 Inflamed seborrheic keratosis: Secondary | ICD-10-CM | POA: Diagnosis not present

## 2020-10-03 DIAGNOSIS — D692 Other nonthrombocytopenic purpura: Secondary | ICD-10-CM | POA: Diagnosis not present

## 2020-10-03 DIAGNOSIS — D1801 Hemangioma of skin and subcutaneous tissue: Secondary | ICD-10-CM | POA: Diagnosis not present

## 2020-10-03 DIAGNOSIS — C44619 Basal cell carcinoma of skin of left upper limb, including shoulder: Secondary | ICD-10-CM | POA: Diagnosis not present

## 2020-10-03 DIAGNOSIS — Z85828 Personal history of other malignant neoplasm of skin: Secondary | ICD-10-CM | POA: Diagnosis not present

## 2020-10-03 DIAGNOSIS — D2261 Melanocytic nevi of right upper limb, including shoulder: Secondary | ICD-10-CM | POA: Diagnosis not present

## 2020-10-03 DIAGNOSIS — D2262 Melanocytic nevi of left upper limb, including shoulder: Secondary | ICD-10-CM | POA: Diagnosis not present

## 2020-10-03 DIAGNOSIS — L570 Actinic keratosis: Secondary | ICD-10-CM | POA: Diagnosis not present

## 2020-10-03 DIAGNOSIS — D225 Melanocytic nevi of trunk: Secondary | ICD-10-CM | POA: Diagnosis not present

## 2020-10-03 DIAGNOSIS — C44319 Basal cell carcinoma of skin of other parts of face: Secondary | ICD-10-CM | POA: Diagnosis not present

## 2020-10-03 DIAGNOSIS — L821 Other seborrheic keratosis: Secondary | ICD-10-CM | POA: Diagnosis not present

## 2020-10-30 DIAGNOSIS — Z85828 Personal history of other malignant neoplasm of skin: Secondary | ICD-10-CM | POA: Diagnosis not present

## 2020-10-30 DIAGNOSIS — C44319 Basal cell carcinoma of skin of other parts of face: Secondary | ICD-10-CM | POA: Diagnosis not present

## 2020-10-31 ENCOUNTER — Other Ambulatory Visit: Payer: Self-pay | Admitting: Family Medicine

## 2020-11-06 DIAGNOSIS — C44619 Basal cell carcinoma of skin of left upper limb, including shoulder: Secondary | ICD-10-CM | POA: Diagnosis not present

## 2020-11-06 DIAGNOSIS — Z4802 Encounter for removal of sutures: Secondary | ICD-10-CM | POA: Diagnosis not present

## 2020-11-06 DIAGNOSIS — Z85828 Personal history of other malignant neoplasm of skin: Secondary | ICD-10-CM | POA: Diagnosis not present

## 2020-11-21 ENCOUNTER — Telehealth: Payer: Self-pay | Admitting: Pulmonary Disease

## 2020-11-21 NOTE — Telephone Encounter (Signed)
Called and spoke with Judson Roch at Dr. Kae Heller office letting her know the info stated by Aaron Edelman. She verbalized understanding and stated she would call pt to let him know this info and have pt call our office to get appt scheduled for a 31min visit with sleep MD. Nothing further needed.

## 2020-11-21 NOTE — Telephone Encounter (Signed)
Called and spoke with Jeremy [name] at Dr. Kae Heller office. She stated that a blank prescription for an oral appliance along with a referral packet was faxed to our office in Dr. Juanetta Gosling attn.  Stated to Jeremy [name] that pt has never seen Dr. Halford Chessman before at our office. She stated that the fax was originally sent to PCP but they stated that this needed to be sent to our office.  Stated to her that pt has seen Dr. Elsworth Soho in the past as well as Wyn Quaker, NP but pt has not been seen since 01/2019 when pt saw Jeremy [name].  I stated to her that I had not seen the fax that had been sent but also stated to her that since pt has not been seen since 2020, pt is probably going to need to have an OV before having the form signed by provider. She stated to double check with provider and if this is the case to let her know and they will then tell Dr. Ron Parker as well as pt to have pt call office to get an appt scheduled.  Since Jeremy [name] was last one that pt saw, routing this to him. Jeremy [name], please advise.

## 2020-11-21 NOTE — Telephone Encounter (Signed)
11/21/2020  Patient is a former Dr. Lenna [name] pt. patient never was seen by Dr. Elsworth Soho.  Patient was seen last by me in February/2020.  Please schedule patient in 30-minute time slot with sleep MD for sleep consult to further review.  Wyn Quaker, FNP

## 2020-12-03 DIAGNOSIS — H6123 Impacted cerumen, bilateral: Secondary | ICD-10-CM | POA: Diagnosis not present

## 2020-12-05 ENCOUNTER — Telehealth: Payer: Self-pay

## 2020-12-05 NOTE — Telephone Encounter (Signed)
Received fax from Dr Ron Parker office, per verbal from Dr Halford Chessman need to call to schedule 30 minute consult with one of our sleep doctors. Writer called patient and scheduled sleep consult with Dr Ander Slade for Thursday 12/21/2020 at 10:30am at the [facility] office. Patient agreeable to time, date and location. Nothing further needed at this time.

## 2020-12-12 ENCOUNTER — Other Ambulatory Visit: Payer: Self-pay

## 2020-12-12 ENCOUNTER — Ambulatory Visit (INDEPENDENT_AMBULATORY_CARE_PROVIDER_SITE_OTHER): Payer: Medicare Other

## 2020-12-12 ENCOUNTER — Ambulatory Visit (INDEPENDENT_AMBULATORY_CARE_PROVIDER_SITE_OTHER): Payer: Medicare Other | Admitting: Family Medicine

## 2020-12-12 ENCOUNTER — Encounter: Payer: Self-pay | Admitting: Family Medicine

## 2020-12-12 VITALS — BP 134/63 | HR 84 | Temp 97.8°F | Wt 189.2 lb

## 2020-12-12 DIAGNOSIS — J411 Mucopurulent chronic bronchitis: Secondary | ICD-10-CM

## 2020-12-12 DIAGNOSIS — G4733 Obstructive sleep apnea (adult) (pediatric): Secondary | ICD-10-CM

## 2020-12-12 DIAGNOSIS — I10 Essential (primary) hypertension: Secondary | ICD-10-CM

## 2020-12-12 DIAGNOSIS — N138 Other obstructive and reflux uropathy: Secondary | ICD-10-CM

## 2020-12-12 DIAGNOSIS — J449 Chronic obstructive pulmonary disease, unspecified: Secondary | ICD-10-CM | POA: Diagnosis not present

## 2020-12-12 DIAGNOSIS — N401 Enlarged prostate with lower urinary tract symptoms: Secondary | ICD-10-CM | POA: Diagnosis not present

## 2020-12-12 DIAGNOSIS — E785 Hyperlipidemia, unspecified: Secondary | ICD-10-CM | POA: Diagnosis not present

## 2020-12-12 MED ORDER — LOSARTAN POTASSIUM 100 MG PO TABS
100.0000 mg | ORAL_TABLET | Freq: Every day | ORAL | 3 refills | Status: DC
Start: 2020-12-12 — End: 2021-11-14

## 2020-12-12 MED ORDER — METOPROLOL SUCCINATE ER 50 MG PO TB24: ORAL_TABLET | ORAL | 3 refills | Status: DC

## 2020-12-12 NOTE — Assessment & Plan Note (Signed)
He has f/u with pulmonology to discuss alternatives to CPAP as he has been unable to tolerate this.

## 2020-12-12 NOTE — Assessment & Plan Note (Signed)
Blood pressure is at goal at for age and co-morbidities.  I recommend continuation of current medications.  In addition they were instructed to follow a low sodium diet with regular exercise to help to maintain adequate control of blood pressure.  ? ?

## 2020-12-12 NOTE — Progress Notes (Signed)
Jeremy [name] - 80 y.o. male MRN [id number]  Date of birth: 29-Sep-1941  Subjective Chief Complaint  Patient presents with  . Hypertension    HPI Jeremy [name] is a 80 y.o. male here today for follow up visit.  He has a history of HTN, OSA, COPD with continued smoking, BPH and HLD.    HTN currently treated with combination of amlodipine, losartan, and metoprolol .  He reports that he is doing well with current medications.  He has not had chest pain, shortness of breath, palpitations, headache.  He continues to play golf regularly.   COPD/Chronic bronchitis is stable at this time.  He does not use any long term inhalers.  He does have albuterol as needed but hasn't needed this recently.  His previous pulmonologist had been doing annual CXR for screening of lung cancer.  He is not eligible for lung cancer screening via low dose CTdue to age.    He has been unable to tolerate CPAP for management of OSA.  He has f/u with pulmonology to discuss alternative treatments.    HLD is managed with simvastatin.  Tolerating well.   ROS:  A comprehensive ROS was completed and negative except as noted per HPI  Allergies  Allergen Reactions  . Sulfa Antibiotics Other (See Comments)    Rash, light headed, shaking  . Penicillins Other (See Comments)    REACTION: hives severe as a child .pen    Past Medical History:  Diagnosis Date  . Enlarged prostate   . Hyperlipidemia   . Hypertension   . Self-catheterizes urinary bladder    pt. does this morning and evening--been doing this since 11/2016  . Sleep apnea    not using a cpap machine    Past Surgical History:  Procedure Laterality Date  . BACK SURGERY    . 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 BLEPHAROPLASTY  12/2018  . LUMBAR LAMINECTOMY/DECOMPRESSION MICRODISCECTOMY Bilateral 08/25/2017   Procedure: Bilateral Lumbar Three- Four, Lumbar Four- Five, Lumbar Five- Sacral One Laminectomy;  Surgeon: Kristeen Miss, MD;  Location: [facility];   Service: Neurosurgery;  Laterality: Bilateral;  Bilateral L3-4 L4-5 L5-S1 Laminectomy  . SKIN CANCER EXCISION     Surg x2...  . TONSILLECTOMY AND ADENOIDECTOMY     Surg as a child...    Social History   Socioeconomic History  . Marital status: Married    Spouse name: Not on file  . Number of children: Not on file  . Years of education: Not on file  . Highest education level: Not on file  Occupational History  . Not on file  Tobacco Use  . Smoking status: Former Smoker    Packs/day: 1.00    Years: 50.00    Pack years: 50.00    Types: Cigarettes    Start date: 12/29/1961  . Smokeless tobacco: Never Used  . Tobacco comment: uses vapor cigarette-quit vaping 12/14/18- smoking less than 12 a day 12/14/18  Substance and Sexual Activity  . Alcohol use: Yes    Comment: couple glasses with dinner  . Drug use: No  . Sexual activity: Not on file  Other Topics Concern  . Not on file  Social History Narrative   Lives with wife in a 2 story home.  Has 2 children.  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 Colon cancer Neg Hx   . Stomach cancer Neg Hx   . Rectal cancer Neg Hx     Health Maintenance  Topic Date Due  . TETANUS/TDAP  01/16/2020  . INFLUENZA VACCINE  Completed  . COVID-19 Vaccine  Completed  . PNA vac Low Risk Adult  Completed     ----------------------------------------------------------------------------------------------------------------------------------------------------------------------------------------------------------------- Physical Exam BP 134/63 (BP Location: Left Arm, Patient Position: Sitting, Cuff Size: Normal)   Pulse 84   Temp 97.8 F (36.6 C)   Wt 189 lb 3.2  oz (85.8 kg)   SpO2 94%   BMI 26.76 kg/m   Physical Exam Constitutional:      Appearance: Normal appearance.  HENT:     Head: Normocephalic and atraumatic.  Eyes:     General: No scleral icterus. Cardiovascular:     Rate and Rhythm: Normal rate and regular rhythm.  Pulmonary:     Effort: Pulmonary effort is normal.     Breath sounds: Normal breath sounds.  Musculoskeletal:     Cervical back: Neck supple.  Neurological:     General: No focal deficit present.     Mental Status: He is alert.  Psychiatric:        Mood and Affect: Mood normal.        Behavior: Behavior normal.     ------------------------------------------------------------------------------------------------------------------------------------------------------------------------------------------------------------------- Assessment and Plan  Essential hypertension Blood pressure is at goal at for age and co-morbidities.  I recommend continuation of current medications.  In addition they were instructed to follow a low sodium diet with regular exercise to help to maintain adequate control of blood pressure.    Chronic bronchitis Stable at this time.  Counseled on smoking cessation.  Updated cxr ordered.   OSA (obstructive sleep apnea) He has f/u with pulmonology to discuss alternatives to CPAP as he has been unable to tolerate this.    Benign prostatic hyperplasia with urinary obstruction Managed by urology.  Stable at this time.   Hyperlipidemia Tolerating simvastatin well.  Continue at current strength.  Update lipid panel.    Meds ordered this encounter  Medications  . losartan (COZAAR) 100 MG tablet    Sig: Take 1 tablet (100 mg total) by mouth daily.    Dispense:  90 tablet    Refill:  3  . metoprolol succinate (TOPROL-XL) 50 MG 24 hr tablet    Sig: TAKE 1 TABLET DAILY WITH OR IMMEDIATELY FOLLOWING A MEAL    Dispense:  90 tablet    Refill:  3    Return in about 6 months (around 06/11/2021)  for HTN.    This visit occurred during the SARS-CoV-2 public health emergency.  Safety protocols were in place, including screening questions prior to the visit, additional usage of staff PPE, and extensive cleaning of exam room while observing appropriate contact time as indicated for disinfecting solutions.

## 2020-12-12 NOTE — Patient Instructions (Signed)
Great to see you today! Have labs and chest xray completed.  Continue current medications See me again in 6 months.

## 2020-12-12 NOTE — Assessment & Plan Note (Signed)
Stable at this time.  Counseled on smoking cessation.  Updated cxr ordered.

## 2020-12-12 NOTE — Assessment & Plan Note (Signed)
Tolerating simvastatin well.  Continue at current strength.  Update lipid panel.  

## 2020-12-12 NOTE — Assessment & Plan Note (Signed)
Managed by urology.  Stable at this time.

## 2020-12-13 LAB — COMPLETE METABOLIC PANEL WITH GFR
AG Ratio: 1.3 (calc) (ref 1.0–2.5)
ALT: 9 U/L (ref 9–46)
AST: 11 U/L (ref 10–35)
Albumin: 4 g/dL (ref 3.6–5.1)
Alkaline phosphatase (APISO): 52 U/L (ref 35–144)
BUN/Creatinine Ratio: 20 (calc) (ref 6–22)
BUN: 33 mg/dL — ABNORMAL HIGH (ref 7–25)
CO2: 30 mmol/L (ref 20–32)
Calcium: 9.3 mg/dL (ref 8.6–10.3)
Chloride: 103 mmol/L (ref 98–110)
Creat: 1.67 mg/dL — ABNORMAL HIGH (ref 0.70–1.11)
GFR, Est African American: 44 mL/min/{1.73_m2} — ABNORMAL LOW (ref >=60)
GFR, Est Non African American: 38 mL/min/{1.73_m2} — ABNORMAL LOW (ref >=60)
Globulin: 3.1 g/dL (calc) (ref 1.9–3.7)
Glucose, Bld: 91 mg/dL (ref 65–99)
Potassium: 4.5 mmol/L (ref 3.5–5.3)
Sodium: 139 mmol/L (ref 135–146)
Total Bilirubin: 1 mg/dL (ref 0.2–1.2)
Total Protein: 7.1 g/dL (ref 6.1–8.1)

## 2020-12-13 LAB — CBC
HCT: 46.3 % (ref 38.5–50.0)
Hemoglobin: 16 g/dL (ref 13.2–17.1)
MCH: 31.6 pg (ref 27.0–33.0)
MCHC: 34.6 g/dL (ref 32.0–36.0)
MCV: 91.3 fL (ref 80.0–100.0)
MPV: 10.6 fL (ref 7.5–12.5)
Platelets: 224 10*3/uL (ref 140–400)
RBC: 5.07 10*6/uL (ref 4.20–5.80)
RDW: 13 % (ref 11.0–15.0)
WBC: 6.4 10*3/uL (ref 3.8–10.8)

## 2020-12-13 LAB — LIPID PANEL
Cholesterol: 137 mg/dL (ref <200)
HDL: 64 mg/dL (ref >=40)
LDL Cholesterol (Calc): 53 mg/dL (calc)
Non-HDL Cholesterol (Calc): 73 mg/dL (calc) (ref <130)
Total CHOL/HDL Ratio: 2.1 (calc) (ref <5.0)
Triglycerides: 114 mg/dL (ref <150)

## 2020-12-13 LAB — PSA: PSA: 3.98 ng/mL (ref <=4.0)

## 2020-12-20 DIAGNOSIS — R3914 Feeling of incomplete bladder emptying: Secondary | ICD-10-CM | POA: Diagnosis not present

## 2020-12-20 DIAGNOSIS — Z8551 Personal history of malignant neoplasm of bladder: Secondary | ICD-10-CM | POA: Diagnosis not present

## 2020-12-20 DIAGNOSIS — N401 Enlarged prostate with lower urinary tract symptoms: Secondary | ICD-10-CM | POA: Diagnosis not present

## 2020-12-20 DIAGNOSIS — R8271 Bacteriuria: Secondary | ICD-10-CM | POA: Diagnosis not present

## 2020-12-21 ENCOUNTER — Other Ambulatory Visit: Payer: Self-pay

## 2020-12-21 ENCOUNTER — Ambulatory Visit (INDEPENDENT_AMBULATORY_CARE_PROVIDER_SITE_OTHER): Payer: Medicare Other | Admitting: Pulmonary Disease

## 2020-12-21 ENCOUNTER — Encounter: Payer: Self-pay | Admitting: Pulmonary Disease

## 2020-12-21 VITALS — BP 140/60 | HR 75 | Temp 97.7°F | Ht 70.5 in | Wt 184.0 lb

## 2020-12-21 DIAGNOSIS — G4733 Obstructive sleep apnea (adult) (pediatric): Secondary | ICD-10-CM

## 2020-12-21 NOTE — Addendum Note (Signed)
Addended by: Lia Foyer R on: 12/21/2020 12:25 PM   Modules accepted: Orders

## 2020-12-21 NOTE — Progress Notes (Signed)
Jeremy [name]    [id number]    08-Dec-1940  Primary Care Physician:Matthews, Einar Pheasant, DO  Referring Physician: [name], [place] [address] [place],  [place] [address]  Chief complaint:   History of obstructive sleep apnea, intolerant of CPAP  HPI:  Diagnosed with obstructive sleep apnea Has tried CPAP over the years Last time he tried CPAP was 3 years ago Has tried several different masks and interfaces that failed to help He is unable to sleep with a CPAP  He still does have snoring, dryness of his mouth in the morning Denies a headache Memory is good He is very active golfs regularly  He is interested in trying a mouthpiece  We did discuss an inspire device as well as an option of treatment  Was able to quit smoking about 2 years ago but resumed smoking with the pandemic and is smoking about half a pack a day Working on quitting  He will usually go to bed between 9 and 10 PM, wakes up about 2:30 AM He gets active for a while and then goes back to sleep-usual wake up time between 630 and 7 AM  Outpatient Encounter Medications as of 12/21/2020  Medication Sig  . amLODipine (NORVASC) 10 MG tablet TAKE 1 TABLET EVERY DAY  . aspirin 81 MG tablet Take 81 mg by mouth daily.   Marland Kitchen b complex vitamins capsule Take 2 capsules by mouth daily.  Marland Kitchen losartan (COZAAR) 100 MG tablet Take 1 tablet (100 mg total) by mouth daily.  . metoprolol succinate (TOPROL-XL) 50 MG 24 hr tablet TAKE 1 TABLET DAILY WITH OR IMMEDIATELY FOLLOWING A MEAL  . Multiple Vitamin (MULTIVITAMIN) capsule Take 1 capsule by mouth daily.  . Multiple Vitamins-Minerals (PRESERVISION/LUTEIN PO) Take by mouth.  Marland Kitchen olopatadine (PATANOL) 0.1 % ophthalmic solution 1 drop 2 times daily.  . simvastatin (ZOCOR) 40 MG tablet TAKE 1 TABLET EVERY DAY  . tamsulosin (FLOMAX) 0.4 MG CAPS Take 0.4 mg by mouth daily after supper.    No facility-administered encounter medications on file as of  12/21/2020.    Allergies as of 12/21/2020 - Review Complete 12/12/2020  Allergen Reaction Noted  . Sulfa antibiotics Other (See Comments) 01/25/2013  . Penicillins Other (See Comments)     Past Medical History:  Diagnosis Date  . Enlarged prostate   . Hyperlipidemia   . Hypertension   . Self-catheterizes urinary bladder    pt. does this morning and evening--been doing this since 11/2016  . Sleep apnea    not using a cpap machine    Past Surgical History:  Procedure Laterality Date  . BACK SURGERY    . 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 BLEPHAROPLASTY  12/2018  . LUMBAR LAMINECTOMY/DECOMPRESSION MICRODISCECTOMY Bilateral 08/25/2017   Procedure: Bilateral Lumbar Three- Four, Lumbar Four- Five, Lumbar Five- Sacral One Laminectomy;  Surgeon: Kristeen Miss, MD;  Location: [facility];  Service: Neurosurgery;  Laterality: Bilateral;  Bilateral L3-4 L4-5 L5-S1 Laminectomy  . SKIN CANCER EXCISION     Surg x2...  . TONSILLECTOMY AND ADENOIDECTOMY     Surg as a child...    Family History  Problem Relation Age of Onset  . Colon cancer Neg Hx   . Stomach cancer Neg Hx   . Rectal cancer Neg Hx     Social History   Socioeconomic History  . Marital status: Married    Spouse name: Not on file  .  Number of children: Not on file  . Years of education: Not on file  . Highest education level: Not on file  Occupational History  . Not on file  Tobacco Use  . Smoking status: Current Every Day Smoker    Packs/day: 0.50    Years: 50.00    Pack years: 25.00    Types: Cigarettes    Start date: 12/29/1961  . Smokeless tobacco: Never Used  . Tobacco comment: uses vapor cigarette-quit vaping 12/14/18- smoking less than 12 a day   Vaping Use  . Vaping Use: Never used  Substance and Sexual Activity  . Alcohol use: Yes    Comment: couple glasses with dinner  . Drug use: No  . Sexual activity: Not on file  Other Topics Concern  . Not on file  Social History Narrative   Lives with wife  in a 2 story home.  Has 2 children.  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   Psychiatric/Behavioral: Positive for sleep disturbance.    Vitals:   12/21/20 1046  BP: 140/60  Pulse: 75  Temp: 97.7 F (36.5 C)  SpO2: 94%     Physical Exam Constitutional:      Appearance: Normal appearance.  HENT:     Head: Normocephalic.     Nose: Nose normal.     Mouth/Throat:     Mouth: Mucous membranes are moist.     Comments: Moist oral mucosa, crowded oropharynx, relative macroglossia Cardiovascular:     Rate and Rhythm: Normal rate and regular rhythm.     Heart sounds: No murmur heard. No friction rub.  Pulmonary:     Effort: No respiratory distress.     Breath sounds: No stridor. No wheezing or rhonchi.  Musculoskeletal:     Cervical back: No rigidity or tenderness.  Neurological:     Mental Status: He is alert.  Psychiatric:        Mood and Affect: Mood normal.     Data Reviewed: Last sleep study did reveal moderate obstructive sleep apnea-2018  Assessment:  Moderate obstructive sleep apnea -Intolerant of CPAP  Has been to see a dentist I do believe he will benefit from an oral device, question whether his joint can be advanced enough to make a significant difference in his number of events  We discussed his efforts at tolerating CPAP and he honestly said he gave it a good effort  Smoking -Smoking cessation counseling -He is trying to quit  Plan/Recommendations: Referral to Dr. Ron Parker for an oral appliance for management of obstructive sleep apnea  Behavioral modifications-lateral sleep  Smoking cessation counseling  I  will see him back in about 6 months concerns   Sherrilyn Rist MD [facility] 12/21/2020, 11:07 AM  CC: [name], DO

## 2020-12-21 NOTE — Patient Instructions (Signed)
Moderate obstructive sleep apnea intolerant of CPAP  Referral to dentist-Dr. Ron Parker for evaluation for an oral appliance   We did talk about inspire device today -We can consider this if an oral device will not work  Continue smoking cessation efforts  Call with significant concerns  Follow-up in 6 months

## 2020-12-22 ENCOUNTER — Telehealth: Payer: Self-pay | Admitting: Pulmonary Disease

## 2020-12-22 NOTE — Telephone Encounter (Signed)
I will refax referral to Dr Ron Parker.  I just sent it this morning.  Nothing further needed.

## 2020-12-22 NOTE — Telephone Encounter (Signed)
Are they referring to the referral? Not sure what else they would be referring to but need to confirm- ATC the number provided and there was no answer, WCB.

## 2021-01-17 ENCOUNTER — Other Ambulatory Visit: Payer: Self-pay | Admitting: Family Medicine

## 2021-01-19 ENCOUNTER — Ambulatory Visit: Payer: Medicare Other

## 2021-01-23 DIAGNOSIS — H6123 Impacted cerumen, bilateral: Secondary | ICD-10-CM | POA: Diagnosis not present

## 2021-03-06 ENCOUNTER — Other Ambulatory Visit: Payer: Self-pay | Admitting: Family Medicine

## 2021-03-09 DIAGNOSIS — Z23 Encounter for immunization: Secondary | ICD-10-CM | POA: Diagnosis not present

## 2021-04-03 DIAGNOSIS — L570 Actinic keratosis: Secondary | ICD-10-CM | POA: Diagnosis not present

## 2021-04-03 DIAGNOSIS — D1801 Hemangioma of skin and subcutaneous tissue: Secondary | ICD-10-CM | POA: Diagnosis not present

## 2021-04-03 DIAGNOSIS — L820 Inflamed seborrheic keratosis: Secondary | ICD-10-CM | POA: Diagnosis not present

## 2021-04-03 DIAGNOSIS — L814 Other melanin hyperpigmentation: Secondary | ICD-10-CM | POA: Diagnosis not present

## 2021-04-03 DIAGNOSIS — L821 Other seborrheic keratosis: Secondary | ICD-10-CM | POA: Diagnosis not present

## 2021-04-03 DIAGNOSIS — C44319 Basal cell carcinoma of skin of other parts of face: Secondary | ICD-10-CM | POA: Diagnosis not present

## 2021-04-03 DIAGNOSIS — B078 Other viral warts: Secondary | ICD-10-CM | POA: Diagnosis not present

## 2021-04-03 DIAGNOSIS — D2261 Melanocytic nevi of right upper limb, including shoulder: Secondary | ICD-10-CM | POA: Diagnosis not present

## 2021-04-03 DIAGNOSIS — D692 Other nonthrombocytopenic purpura: Secondary | ICD-10-CM | POA: Diagnosis not present

## 2021-04-03 DIAGNOSIS — D225 Melanocytic nevi of trunk: Secondary | ICD-10-CM | POA: Diagnosis not present

## 2021-04-03 DIAGNOSIS — D2262 Melanocytic nevi of left upper limb, including shoulder: Secondary | ICD-10-CM | POA: Diagnosis not present

## 2021-04-03 DIAGNOSIS — Z85828 Personal history of other malignant neoplasm of skin: Secondary | ICD-10-CM | POA: Diagnosis not present

## 2021-04-05 DIAGNOSIS — H6120 Impacted cerumen, unspecified ear: Secondary | ICD-10-CM | POA: Diagnosis not present

## 2021-04-27 ENCOUNTER — Telehealth: Payer: Self-pay | Admitting: Family Medicine

## 2021-04-27 NOTE — Chronic Care Management (AMB) (Signed)
  Chronic Care Management   Note  04/27/2021 Name: Jeremy [name] MRN: [id number] DOB: 1941/10/01  Jeremy [name] is a 80 y.o. year old male who is a primary care patient of [name], DO. I reached out to Jeremy [name] by phone today in response to a referral sent by [name] PCP, [name], DO.   [name] was given information about Chronic Care Management services today including:  CCM service includes personalized support from designated clinical staff supervised by his physician, including individualized plan of care and coordination with other care providers 24/7 contact phone numbers for assistance for urgent and routine care needs. Service will only be billed when office clinical staff spend 20 minutes or more in a month to coordinate care. Only one practitioner may furnish and bill the service in a calendar month. The patient may stop CCM services at any time (effective at the end of the month) by phone call to the office staff.   Patient did not agree to enrollment in care management services and does not wish to consider at this time.  Follow up plan:   Lauretta Grill [employer] Scheduler

## 2021-05-01 DIAGNOSIS — Z85828 Personal history of other malignant neoplasm of skin: Secondary | ICD-10-CM | POA: Diagnosis not present

## 2021-05-01 DIAGNOSIS — C44319 Basal cell carcinoma of skin of other parts of face: Secondary | ICD-10-CM | POA: Diagnosis not present

## 2021-06-12 ENCOUNTER — Ambulatory Visit: Payer: Medicare Other | Admitting: Family Medicine

## 2021-07-03 ENCOUNTER — Other Ambulatory Visit: Payer: Self-pay

## 2021-07-03 ENCOUNTER — Ambulatory Visit (INDEPENDENT_AMBULATORY_CARE_PROVIDER_SITE_OTHER): Payer: Medicare Other | Admitting: Family Medicine

## 2021-07-03 ENCOUNTER — Encounter: Payer: Self-pay | Admitting: Family Medicine

## 2021-07-03 VITALS — BP 134/59 | HR 70 | Temp 97.8°F | Ht 70.5 in | Wt 174.0 lb

## 2021-07-03 DIAGNOSIS — D692 Other nonthrombocytopenic purpura: Secondary | ICD-10-CM | POA: Diagnosis not present

## 2021-07-03 DIAGNOSIS — N401 Enlarged prostate with lower urinary tract symptoms: Secondary | ICD-10-CM

## 2021-07-03 DIAGNOSIS — Z23 Encounter for immunization: Secondary | ICD-10-CM

## 2021-07-03 DIAGNOSIS — I10 Essential (primary) hypertension: Secondary | ICD-10-CM | POA: Diagnosis not present

## 2021-07-03 DIAGNOSIS — S81812A Laceration without foreign body, left lower leg, initial encounter: Secondary | ICD-10-CM | POA: Diagnosis not present

## 2021-07-03 DIAGNOSIS — N138 Other obstructive and reflux uropathy: Secondary | ICD-10-CM | POA: Diagnosis not present

## 2021-07-03 DIAGNOSIS — E785 Hyperlipidemia, unspecified: Secondary | ICD-10-CM

## 2021-07-03 DIAGNOSIS — F17210 Nicotine dependence, cigarettes, uncomplicated: Secondary | ICD-10-CM | POA: Diagnosis not present

## 2021-07-03 MED ORDER — SIMVASTATIN 40 MG PO TABS: 40.0000 mg | ORAL_TABLET | Freq: Every day | ORAL | 1 refills | Status: DC

## 2021-07-03 NOTE — Assessment & Plan Note (Signed)
Couple skin tears.  Redressed today.  Updated tetanus.

## 2021-07-03 NOTE — Patient Instructions (Signed)
Great to see you today! Continue current medications.  We'll see you again in 6 months.

## 2021-07-03 NOTE — Assessment & Plan Note (Signed)
Blood pressure is well controlled at this time.  Recommend continuation of current medication in addition to low-sodium diet.  Continue to counsel smoking cessation.

## 2021-07-03 NOTE — Assessment & Plan Note (Signed)
Stable symptoms, continue tamsulosin.

## 2021-07-03 NOTE — Progress Notes (Signed)
Jeremy [name] - 80 y.o. male MRN [id number]  Date of birth: 1941-09-30  Subjective Chief Complaint  Patient presents with   Hypertension   skin tears    HPI Jeremy [name] is an 80 year old male here today for a follow-up visit.  Reports that overall he is doing well however has had some issues with skin tears.  He does have a couple new skin tears.  He is not up-to-date on his tetanus vaccine.  He has been dressing with bandages at home.  He denies any swelling or significant pain with this.  Blood pressure remains well controlled with current medications.  He is tolerating these well without side effects.  He denies chest pain, shortness of breath, palpitations, headache or vision changes.  Unfortunately, he does continue to smoke.Jeremy [name]  He plays golf about 4 times per week and denies any problems with tolerating this.  Respiratory symptoms are stable at this time.  Denies any increased wheezing, shortness of breath or sputum production.  He would like to have ears rechecked due to a bilateral cerumen impaction.  ROS:  A comprehensive ROS was completed and negative except as noted per HPI  Allergies  Allergen Reactions   Sulfa Antibiotics Other (See Comments)    Rash, light headed, shaking   Penicillins Other (See Comments)    REACTION: hives severe as a child .pen    Past Medical History:  Diagnosis Date   Enlarged prostate    Hyperlipidemia    Hypertension    Self-catheterizes urinary bladder    pt. 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   TONSILLECTOMY AND ADENOIDECTOMY     Surg as a child...    Social History   Socioeconomic History   Marital status: Married    Spouse name: Not on file   Number of children: Not on file   Years of education: Not on file   Highest education level: Not on file  Occupational History   Not on file  Tobacco Use   Smoking status: Every Day    Packs/day: 0.50    Years: 50.00    Pack years: 25.00    Types: Cigarettes    Start date: 12/29/1961   Smokeless tobacco: Never   Tobacco comments:    uses vapor cigarette-quit vaping 12/14/18- smoking less than 12 a day   Vaping Use   Vaping Use: Never used  Substance and Sexual Activity   Alcohol use: Yes    Comment: couple glasses with dinner   Drug use: No   Sexual activity: Not on file  Other Topics Concern   Not on file  Social History Narrative   Lives with wife in a 2 story home.  Has 2 children.  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, commissioning)   Social Determinants of Health   Financial Resource Strain: Not on file  Food Insecurity: Not on file  Transportation Needs: Not on file  Physical Activity: Not on file  Stress: Not on file  Social Connections: Not on file    Family History  Problem Relation Age of Onset   Colon cancer Neg Hx    Stomach cancer Neg Hx    Rectal cancer Neg Hx     Health Maintenance  Topic Date Due   Zoster Vaccines- Shingrix (1 of 2) Never done   COVID-19 Vaccine (4 - Booster for Pfizer series) 11/17/2020   INFLUENZA VACCINE  06/11/2021   TETANUS/TDAP  07/04/2031   PNA vac Low Risk Adult  Completed   HPV VACCINES  Aged Out     ----------------------------------------------------------------------------------------------------------------------------------------------------------------------------------------------------------------- Physical Exam BP (!) 134/59 (BP Location: Left Arm,  Patient Position: Sitting, Cuff Size: Normal)   Pulse 70   Temp 97.8 F (36.6 C)   Ht 5' 10.5" (1.791 m)   Wt 174 lb (78.9 kg)   SpO2 96%   BMI 24.61 kg/m   Physical Exam Constitutional:      Appearance: Normal appearance.  HENT:     Head: Normocephalic and atraumatic.  Eyes:     General: No scleral icterus. Cardiovascular:     Rate and Rhythm: Normal rate and regular rhythm.  Pulmonary:     Effort: Pulmonary effort is normal.     Breath sounds: Normal breath sounds.  Musculoskeletal:     Cervical back: Normal range of motion and neck supple.  Skin:    Comments: Skin tear to left lower lateral leg.  There is a small amount of bleeding around this area.  Bleeding controlled with direct pressure.  Nonadherent pad as well as Tegaderm covering applied.  Small Tegaderm dressing applied to additional skin tear on distal lower leg.  Bruising noted on all extremities.  Neurological:     General: No focal deficit present.     Mental Status: He is alert.  Psychiatric:        Mood and Affect: Mood normal.        Behavior: Behavior normal.    ------------------------------------------------------------------------------------------------------------------------------------------------------------------------------------------------------------------- Assessment and Plan  Essential hypertension Blood pressure is well controlled at this time.  Recommend continuation of current medication in addition to low-sodium diet.  Continue to counsel smoking cessation.  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  Benign prostatic hyperplasia with urinary obstruction Stable symptoms, continue tamsulosin.  Hyperlipidemia Lab Results  Component Value Date   LDLCALC 53 12/12/2020   Tolerating simvastatin well, continue at current strength.  Laceration of skin of left lower leg Couple skin tears.  Redressed today.  Updated  tetanus.   Meds ordered this encounter  Medications   simvastatin (ZOCOR) 40 MG tablet    Sig: Take 1 tablet (40 mg total) by mouth daily.    Dispense:  90 tablet    Refill:  1    Return in about 6 months (around 01/03/2022) for HTN/Labs.    This visit occurred during the SARS-CoV-2 public health emergency.  Safety protocols were in place, including screening questions prior to the visit, additional usage of staff PPE, and extensive cleaning of exam room while observing appropriate contact time as indicated for disinfecting solutions.

## 2021-07-03 NOTE — Assessment & Plan Note (Signed)
Lab Results  Component Value Date   LDLCALC 53 12/12/2020   Tolerating simvastatin well, continue at current strength.

## 2021-07-03 NOTE — Assessment & Plan Note (Signed)
Continues to have some senile purpura with some thin skin and skin tears.

## 2021-07-19 DIAGNOSIS — H524 Presbyopia: Secondary | ICD-10-CM | POA: Diagnosis not present

## 2021-07-19 DIAGNOSIS — H52203 Unspecified astigmatism, bilateral: Secondary | ICD-10-CM | POA: Diagnosis not present

## 2021-07-19 DIAGNOSIS — Z961 Presence of intraocular lens: Secondary | ICD-10-CM | POA: Diagnosis not present

## 2021-07-19 DIAGNOSIS — H353131 Nonexudative age-related macular degeneration, bilateral, early dry stage: Secondary | ICD-10-CM | POA: Diagnosis not present

## 2021-08-03 DIAGNOSIS — Z23 Encounter for immunization: Secondary | ICD-10-CM | POA: Diagnosis not present

## 2021-08-20 ENCOUNTER — Encounter (INDEPENDENT_AMBULATORY_CARE_PROVIDER_SITE_OTHER): Payer: Medicare Other | Admitting: Ophthalmology

## 2021-08-28 ENCOUNTER — Encounter (INDEPENDENT_AMBULATORY_CARE_PROVIDER_SITE_OTHER): Payer: Self-pay | Admitting: Ophthalmology

## 2021-08-28 ENCOUNTER — Other Ambulatory Visit: Payer: Self-pay

## 2021-08-28 ENCOUNTER — Ambulatory Visit (INDEPENDENT_AMBULATORY_CARE_PROVIDER_SITE_OTHER): Payer: Medicare Other | Admitting: Ophthalmology

## 2021-08-28 DIAGNOSIS — H353122 Nonexudative age-related macular degeneration, left eye, intermediate dry stage: Secondary | ICD-10-CM

## 2021-08-28 DIAGNOSIS — H353114 Nonexudative age-related macular degeneration, right eye, advanced atrophic with subfoveal involvement: Secondary | ICD-10-CM | POA: Diagnosis not present

## 2021-08-28 NOTE — Assessment & Plan Note (Signed)
OD, no sign of CNVM, atrophy centrally in the fovea accounts for acuity

## 2021-08-28 NOTE — Progress Notes (Signed)
08/28/2021     CHIEF COMPLAINT Patient presents for  Chief Complaint  Patient presents with   Retina Follow Up      HISTORY OF PRESENT ILLNESS: Jeremy [name] is a 80 y.o. male who presents to the clinic today for:   HPI     Retina Follow Up   Patient presents with  Dry AMD.  In both eyes.  This started 1 year ago.  Severity is mild.  Duration of 1 year.  Since onset it is stable.        Comments   1 yr fu ou oct fundus photos. Patient states vision is stable and unchanged since last visit. Denies any new floaters or FOL. Pt takes preservision daily.      Last edited by Laurin Coder on 08/28/2021  9:10 AM.      Referring physician: Rutherford Guys, [place],  [place] [id number]  HISTORICAL INFORMATION:   Selected notes from the MEDICAL RECORD NUMBER    Lab Results  Component Value Date   HGBA1C 5.6 07/29/2013     CURRENT MEDICATIONS: No current outpatient medications on file. (Ophthalmic Drugs)   No current facility-administered medications for this visit. (Ophthalmic Drugs)   Current Outpatient Medications (Other)  Medication Sig   amLODipine (NORVASC) 10 MG tablet TAKE 1 TABLET EVERY DAY   aspirin 81 MG tablet Take 81 mg by mouth daily.    b complex vitamins capsule Take 2 capsules by mouth daily.   losartan (COZAAR) 100 MG tablet Take 1 tablet (100 mg total) by mouth daily.   metoprolol succinate (TOPROL-XL) 50 MG 24 hr tablet TAKE 1 TABLET DAILY WITH OR IMMEDIATELY FOLLOWING A MEAL   Multiple Vitamin (MULTIVITAMIN) capsule Take 1 capsule by mouth daily.   Multiple Vitamins-Minerals (PRESERVISION/LUTEIN PO) Take by mouth.   simvastatin (ZOCOR) 40 MG tablet Take 1 tablet (40 mg total) by mouth daily.   tamsulosin (FLOMAX) 0.4 MG CAPS Take 0.4 mg by mouth daily after supper.    No current facility-administered medications for this visit. (Other)      REVIEW OF SYSTEMS:    ALLERGIES Allergies  Allergen Reactions    Sulfa Antibiotics Other (See Comments)    Rash, light headed, shaking   Penicillins Other (See Comments)    REACTION: hives severe as a child .pen    PAST MEDICAL HISTORY Past Medical History:  Diagnosis Date   Enlarged prostate    Hyperlipidemia    Hypertension    Self-catheterizes urinary bladder    pt. does this morning and evening--been doing this since 11/2016   Sleep apnea    not using a cpap machine   Past Surgical History:  Procedure Laterality Date   BACK SURGERY     BLADDER TUMOR EXCISION  2015   benign   BLEPHAROPLASTY  12/2018   LUMBAR LAMINECTOMY/DECOMPRESSION MICRODISCECTOMY Bilateral 08/25/2017   Procedure: Bilateral Lumbar Three- Four, Lumbar Four- Five, Lumbar Five- Sacral One Laminectomy;  Surgeon: Kristeen Miss, MD;  Location: [facility];  Service: Neurosurgery;  Laterality: Bilateral;  Bilateral L3-4 L4-5 L5-S1 Laminectomy   SKIN CANCER EXCISION     Surg x2...   TONSILLECTOMY AND ADENOIDECTOMY     Surg as a child...    FAMILY HISTORY Family History  Problem Relation Age of Onset   Colon cancer Neg Hx    Stomach cancer Neg Hx    Rectal cancer Neg Hx     SOCIAL HISTORY Social History  Tobacco Use   Smoking status: Every Day    Packs/day: 0.50    Years: 50.00    Pack years: 25.00    Types: Cigarettes    Start date: 12/29/1961   Smokeless tobacco: Never   Tobacco comments:    uses vapor cigarette-quit vaping 12/14/18- smoking less than 12 a day   Vaping Use   Vaping Use: Never used  Substance Use Topics   Alcohol use: Yes    Comment: couple glasses with dinner   Drug use: No         OPHTHALMIC EXAM:  Base Eye Exam     Visual Acuity (ETDRS)       Right Left   Dist cc 20/50 -2 20/40   Dist ph cc NI NI         Tonometry (Tonopen, 9:17 AM)       Right Left   Pressure 24 20         Pupils       Pupils Dark Light React APD   Right PERRL 2.5 2 Minimal None   Left PERRL 2.5 2 Minimal None         Visual Fields (Counting  fingers)       Left Right    Full Full  With correction        Extraocular Movement       Right Left    Full Full         Neuro/Psych     Oriented x3: Yes   Mood/Affect: Normal         Dilation     Both eyes: 1.0% Mydriacyl, 2.5% Phenylephrine @ 9:17 AM           Slit Lamp and Fundus Exam     External Exam       Right Left   External Normal Normal         Slit Lamp Exam       Right Left   Lids/Lashes Normal Normal   Conjunctiva/Sclera White and quiet White and quiet   Cornea Clear Clear   Anterior Chamber Deep and quiet Deep and quiet   Iris Round and reactive Round and reactive   Lens Posterior chamber intraocular lens, Centered posterior chamber intraocular lens Posterior chamber intraocular lens, Centered posterior chamber intraocular lens   Anterior Vitreous Normal Normal         Fundus Exam       Right Left   Posterior Vitreous Normal Normal   Disc Normal Normal   C/D Ratio 0.25 0.25   Macula Geographic atrophy, no disciform scar, no macular thickening, no hemorrhage Soft drusen, large subfoveal drusenoid change., no macular thickening, Retinal pigment epithelial mottling, no hemorrhage   Vessels Normal Normal   Periphery Normal Normal            IMAGING AND PROCEDURES  Imaging and Procedures for 08/28/21  OCT, Retina - OU - Both Eyes       Right Eye Quality was good. Scan locations included subfoveal. Central Foveal Thickness: 229. Findings include outer retinal atrophy, central retinal atrophy, inner retinal atrophy.   Left Eye Quality was good. Scan locations included subfoveal. Central Foveal Thickness: 295. Findings include retinal drusen , subretinal hyper-reflective material.   Notes OD Retinal atrophy, RPE dropout, no signs of CNVM  OS, with large subfoveal drusenoid deposit, pigmentary migration, no signs of CNVM.     Color Fundus Photography Optos - OU - Both Eyes  Right Eye Progression has no prior  data. Disc findings include normal observations. Macula : geographic atrophy, drusen. Vessels : normal observations. Periphery : normal observations.   Left Eye Progression has no prior data. Disc findings include normal observations. Macula : geographic atrophy, drusen. Vessels : normal observations. Periphery : normal observations.              ASSESSMENT/PLAN:  Intermediate stage nonexudative age-related macular degeneration of left eye OS stable with good acuity, no signs of CNVM  Advanced nonexudative age-related macular degeneration of right eye with subfoveal involvement OD, no sign of CNVM, atrophy centrally in the fovea accounts for acuity     ICD-10-CM   1. Intermediate stage nonexudative age-related macular degeneration of left eye  H35.3122 OCT, Retina - OU - Both Eyes    Color Fundus Photography Optos - OU - Both Eyes    2. Advanced nonexudative age-related macular degeneration of right eye with subfoveal involvement  H35.3114       1.  Bilateral AMD.  No sign of CNVM.  OS with good acuity  2.  OD, acuity limited by foveal geographic atrophy in the FAZ region.  No signs of active CNVM  3.  Ophthalmic Meds Ordered this visit:  No orders of the defined types were placed in this encounter.      Return in about 1 year (around 08/28/2022) for DILATE OU, COLOR FP, OCT.  There are no Patient Instructions on file for this visit.   Explained the diagnoses, plan, and follow up with the patient and they expressed understanding.  Patient expressed understanding of the importance of proper follow up care.   Clent Demark Jadan Hinojos M.D. Diseases & Surgery of the Retina and Vitreous [facility] [place] 08/28/21     Abbreviations: M myopia (nearsighted); A astigmatism; H hyperopia (farsighted); P presbyopia; Mrx spectacle prescription;  CTL contact lenses; OD right eye; OS left eye; OU both eyes  XT exotropia; ET esotropia; PEK punctate epithelial keratitis; PEE  punctate epithelial erosions; DES dry eye syndrome; MGD meibomian gland dysfunction; ATs artificial tears; PFAT's preservative free artificial tears; [place] nuclear sclerotic cataract; PSC posterior subcapsular cataract; ERM epi-retinal membrane; PVD posterior vitreous detachment; RD retinal detachment; DM diabetes mellitus; DR diabetic retinopathy; NPDR non-proliferative diabetic retinopathy; PDR proliferative diabetic retinopathy; CSME clinically significant macular edema; DME diabetic macular edema; dbh dot blot hemorrhages; CWS cotton wool spot; POAG primary open angle glaucoma; C/D cup-to-disc ratio; HVF humphrey visual field; GVF goldmann visual field; OCT optical coherence tomography; IOP intraocular pressure; BRVO Branch retinal vein occlusion; CRVO central retinal vein occlusion; CRAO central retinal artery occlusion; BRAO branch retinal artery occlusion; RT retinal tear; SB scleral buckle; PPV pars plana vitrectomy; VH Vitreous hemorrhage; PRP panretinal laser photocoagulation; IVK intravitreal kenalog; VMT vitreomacular traction; MH Macular hole;  NVD neovascularization of the disc; NVE neovascularization elsewhere; AREDS age related eye disease study; ARMD age related macular degeneration; POAG primary open angle glaucoma; EBMD epithelial/anterior basement membrane dystrophy; ACIOL anterior chamber intraocular lens; IOL intraocular lens; PCIOL posterior chamber intraocular lens; Phaco/IOL phacoemulsification with intraocular lens placement; [place] photorefractive keratectomy; LASIK laser assisted in situ keratomileusis; HTN hypertension; DM diabetes mellitus; COPD chronic obstructive pulmonary disease

## 2021-08-28 NOTE — Assessment & Plan Note (Signed)
OS stable with good acuity, no signs of CNVM

## 2021-09-04 DIAGNOSIS — Z23 Encounter for immunization: Secondary | ICD-10-CM | POA: Diagnosis not present

## 2021-10-09 DIAGNOSIS — L309 Dermatitis, unspecified: Secondary | ICD-10-CM | POA: Diagnosis not present

## 2021-10-09 DIAGNOSIS — L570 Actinic keratosis: Secondary | ICD-10-CM | POA: Diagnosis not present

## 2021-10-09 DIAGNOSIS — B078 Other viral warts: Secondary | ICD-10-CM | POA: Diagnosis not present

## 2021-10-09 DIAGNOSIS — L820 Inflamed seborrheic keratosis: Secondary | ICD-10-CM | POA: Diagnosis not present

## 2021-10-09 DIAGNOSIS — D2262 Melanocytic nevi of left upper limb, including shoulder: Secondary | ICD-10-CM | POA: Diagnosis not present

## 2021-10-09 DIAGNOSIS — Z85828 Personal history of other malignant neoplasm of skin: Secondary | ICD-10-CM | POA: Diagnosis not present

## 2021-10-09 DIAGNOSIS — L821 Other seborrheic keratosis: Secondary | ICD-10-CM | POA: Diagnosis not present

## 2021-10-09 DIAGNOSIS — C44729 Squamous cell carcinoma of skin of left lower limb, including hip: Secondary | ICD-10-CM | POA: Diagnosis not present

## 2021-10-09 DIAGNOSIS — D225 Melanocytic nevi of trunk: Secondary | ICD-10-CM | POA: Diagnosis not present

## 2021-10-09 DIAGNOSIS — D1801 Hemangioma of skin and subcutaneous tissue: Secondary | ICD-10-CM | POA: Diagnosis not present

## 2021-10-15 ENCOUNTER — Other Ambulatory Visit: Payer: Self-pay | Admitting: Family Medicine

## 2021-11-07 ENCOUNTER — Other Ambulatory Visit: Payer: Self-pay | Admitting: Family Medicine

## 2021-11-13 ENCOUNTER — Other Ambulatory Visit: Payer: Self-pay | Admitting: Family Medicine

## 2021-12-14 DIAGNOSIS — N401 Enlarged prostate with lower urinary tract symptoms: Secondary | ICD-10-CM | POA: Diagnosis not present

## 2021-12-14 DIAGNOSIS — R3914 Feeling of incomplete bladder emptying: Secondary | ICD-10-CM | POA: Diagnosis not present

## 2021-12-14 DIAGNOSIS — D414 Neoplasm of uncertain behavior of bladder: Secondary | ICD-10-CM | POA: Diagnosis not present

## 2021-12-14 DIAGNOSIS — R311 Benign essential microscopic hematuria: Secondary | ICD-10-CM | POA: Diagnosis not present

## 2021-12-18 ENCOUNTER — Other Ambulatory Visit: Payer: Self-pay | Admitting: Urology

## 2021-12-21 NOTE — Patient Instructions (Signed)
DUE TO COVID-19 ONLY ONE VISITOR IS ALLOWED TO COME WITH YOU AND STAY IN THE WAITING ROOM ONLY DURING PRE OP AND PROCEDURE.   **NO VISITORS ARE ALLOWED IN THE SHORT STAY AREA OR RECOVERY ROOM!!**  IF YOU WILL BE ADMITTED INTO THE HOSPITAL YOU ARE ALLOWED ONLY TWO SUPPORT PEOPLE DURING VISITATION HOURS ONLY (7 AM -8PM)   The support person(s) must pass our screening, gel in and out, and wear a mask at all times, including in the patients room. Patients must also wear a mask when staff or their support person are in the room. Visitors GUEST BADGE MUST BE WORN VISIBLY  One adult visitor may remain with you overnight and MUST be in the room by 8 P.M.  No visitors under the age of 76. Any visitor under the age of 62 must be accompanied by an adult.        Your procedure is scheduled on: 01/03/22   Report to [facility] Main Entrance    Report to short stay at : 5:15 AM   Call this number if you have problems the morning of surgery [phone]   Do not eat food :After Midnight.   May have liquids until :4:30 AM   day of surgery  CLEAR LIQUID DIET  Foods Allowed                                                                     Foods Excluded  Water, Black Coffee and tea, regular and decaf                             liquids that you cannot  Plain Jell-O in any flavor  (No red)                                           see through such as: Fruit ices (not with fruit pulp)                                     milk, soups, orange juice              Iced Popsicles (No red)                                    All solid food                                   Apple juices Sports drinks like Gatorade (No red) Lightly seasoned clear broth or consume(fat free) Sugar Sample Menu Breakfast                                Lunch  Supper Cranberry juice                    Beef broth                            Chicken broth Jell-O                                      Grape juice                           Apple juice Coffee or tea                        Jell-O                                      Popsicle                                                Coffee or tea                        Coffee or tea    FOLLOW BOWEL PREP AND ANY ADDITIONAL PRE OP INSTRUCTIONS YOU RECEIVED FROM YOUR SURGEON'S OFFICE!!!    Oral Hygiene is also important to reduce your risk of infection.                                    Remember - BRUSH YOUR TEETH THE MORNING OF SURGERY WITH YOUR REGULAR TOOTHPASTE   Do NOT smoke after Midnight   Take these medicines the morning of surgery with A SIP OF WATER: metoprolol,amlodipine.  DO NOT TAKE ANY ORAL DIABETIC MEDICATIONS DAY OF YOUR SURGERY                              You may not have any metal on your body including hair pins, jewelry, and body piercing             Do not wear lotions, powders, perfumes/cologne, or deodorant              Men may shave face and neck.   Do not bring valuables to the hospital. [place].   Contacts, dentures or bridgework may not be worn into surgery.   Bring small overnight bag day of surgery.    Patients discharged on the day of surgery will not be allowed to drive home.  Someone needs to stay with you for the first 24 hours after anesthesia.   Special Instructions: Bring a copy of your healthcare power of attorney and living will documents         the day of surgery if you haven't scanned them before.              Please read over the following fact sheets you were given:  IF YOU HAVE QUESTIONS ABOUT YOUR PRE-OP INSTRUCTIONS PLEASE CALL [phone]     [facility] - Preparing for Surgery Before surgery, you can play an important role.  Because skin is not sterile, your skin needs to be as free of germs as possible.  You can reduce the number of germs on your skin by washing with CHG (chlorahexidine gluconate) soap before surgery.   CHG is an antiseptic cleaner which kills germs and bonds with the skin to continue killing germs even after washing. Please DO NOT use if you have an allergy to CHG or antibacterial soaps.  If your skin becomes reddened/irritated stop using the CHG and inform your nurse when you arrive at Short Stay. Do not shave (including legs and underarms) for at least 48 hours prior to the first CHG shower.  You may shave your face/neck. Please follow these instructions carefully:  1.  Shower with CHG Soap the night before surgery and the  morning of Surgery.  2.  If you choose to wash your hair, wash your hair first as usual with your  normal  shampoo.  3.  After you shampoo, rinse your hair and body thoroughly to remove the  shampoo.                           4.  Use CHG as you would any other liquid soap.  You can apply chg directly  to the skin and wash                       Gently with a scrungie or clean washcloth.  5.  Apply the CHG Soap to your body ONLY FROM THE NECK DOWN.   Do not use on face/ open                           Wound or open sores. Avoid contact with eyes, ears mouth and genitals (private parts).                       Wash face,  Genitals (private parts) with your normal soap.             6.  Wash thoroughly, paying special attention to the area where your surgery  will be performed.  7.  Thoroughly rinse your body with warm water from the neck down.  8.  DO NOT shower/wash with your normal soap after using and rinsing off  the CHG Soap.                9.  Pat yourself dry with a clean towel.            10.  Wear clean pajamas.            11.  Place clean sheets on your bed the night of your first shower and do not  sleep with pets. Day of Surgery : Do not apply any lotions/deodorants the morning of surgery.  Please wear clean clothes to the hospital/surgery center.  FAILURE TO FOLLOW THESE INSTRUCTIONS MAY RESULT IN THE CANCELLATION OF YOUR SURGERY PATIENT  SIGNATURE_________________________________  NURSE SIGNATURE__________________________________  ________________________________________________________________________

## 2021-12-25 ENCOUNTER — Encounter (HOSPITAL_COMMUNITY): Payer: Self-pay

## 2021-12-25 ENCOUNTER — Other Ambulatory Visit: Payer: Self-pay

## 2021-12-25 ENCOUNTER — Encounter (HOSPITAL_COMMUNITY)
Admission: RE | Admit: 2021-12-25 | Discharge: 2021-12-25 | Disposition: A | Discharge status: Home / Self Care | Payer: Medicare Other | Source: Ambulatory Visit | Attending: Urology | Admitting: Urology

## 2021-12-25 VITALS — BP 156/75 | HR 72 | Temp 97.7°F | Resp 18 | Ht 70.5 in | Wt 175.2 lb

## 2021-12-25 DIAGNOSIS — Z01818 Encounter for other preprocedural examination: Secondary | ICD-10-CM | POA: Insufficient documentation

## 2021-12-25 DIAGNOSIS — I10 Essential (primary) hypertension: Secondary | ICD-10-CM | POA: Insufficient documentation

## 2021-12-25 HISTORY — DX: Malignant (primary) neoplasm, unspecified: C80.1

## 2021-12-25 HISTORY — DX: Chronic obstructive pulmonary disease, unspecified: J44.9

## 2021-12-25 LAB — CBC
HCT: 44.2 % (ref 39.0–52.0)
Hemoglobin: 14.5 g/dL (ref 13.0–17.0)
MCH: 31.7 pg (ref 26.0–34.0)
MCHC: 32.8 g/dL (ref 30.0–36.0)
MCV: 96.5 fL (ref 80.0–100.0)
Platelets: 240 10*3/uL (ref 150–400)
RBC: 4.58 MIL/uL (ref 4.22–5.81)
RDW: 13.9 % (ref 11.5–15.5)
WBC: 7.6 10*3/uL (ref 4.0–10.5)
nRBC: 0 % (ref 0.0–0.2)

## 2021-12-25 LAB — BASIC METABOLIC PANEL
Anion gap: 8 (ref 5–15)
BUN: 45 mg/dL — ABNORMAL HIGH (ref 8–23)
CO2: 26 mmol/L (ref 22–32)
Calcium: 8.5 mg/dL — ABNORMAL LOW (ref 8.9–10.3)
Chloride: 101 mmol/L (ref 98–111)
Creatinine, Ser: 2.03 mg/dL — ABNORMAL HIGH (ref 0.61–1.24)
GFR, Estimated: 32 mL/min — ABNORMAL LOW (ref >60)
Glucose, Bld: 86 mg/dL (ref 70–99)
Potassium: 4.7 mmol/L (ref 3.5–5.1)
Sodium: 135 mmol/L (ref 135–145)

## 2021-12-25 NOTE — Progress Notes (Addendum)
COVID Vaccine Completed:Yes Date COVID Vaccine completed: 2022 x 4 COVID vaccine manufacturer: Pfizer    COVID Test: N/A Bowel prep reminder: N/A  PCP - DO: [name] Cardiologist -   Chest x-ray -  EKG -  Stress Test -  ECHO -  Cardiac Cath -  Pacemaker/ICD device last checked:  Sleep Study - Yes CPAP - NO  Fasting Blood Sugar -  Checks Blood Sugar _____ times a day  Blood Thinner Instructions: Aspirin Instructions: Last Dose:  Anesthesia review: Hx: HTN,Smoker,OSA(NO CPAP),COPD.  Patient denies shortness of breath, fever, cough and chest pain at PAT appointment   Patient verbalized understanding of instructions that were given to them at the PAT appointment. Patient was also instructed that they will need to review over the PAT instructions again at home before surgery.

## 2021-12-25 NOTE — Progress Notes (Signed)
Lab. Results: Creatinine: 2.03

## 2021-12-30 DIAGNOSIS — L03116 Cellulitis of left lower limb: Secondary | ICD-10-CM | POA: Diagnosis not present

## 2021-12-30 DIAGNOSIS — S81802A Unspecified open wound, left lower leg, initial encounter: Secondary | ICD-10-CM | POA: Diagnosis not present

## 2022-01-01 DIAGNOSIS — S81802D Unspecified open wound, left lower leg, subsequent encounter: Secondary | ICD-10-CM | POA: Diagnosis not present

## 2022-01-01 DIAGNOSIS — L03116 Cellulitis of left lower limb: Secondary | ICD-10-CM | POA: Diagnosis not present

## 2022-01-02 DIAGNOSIS — L03116 Cellulitis of left lower limb: Secondary | ICD-10-CM | POA: Diagnosis not present

## 2022-01-02 NOTE — Anesthesia Preprocedure Evaluation (Addendum)
Anesthesia Evaluation  Patient identified by MRN, date of birth, ID band Patient awake    Reviewed: Allergy & Precautions, NPO status , Patient's Chart, lab work & pertinent test results, reviewed documented beta blocker date and time   History of Anesthesia Complications Negative for: history of anesthetic complications  Airway Mallampati: II  TM Distance: >3 FB Neck ROM: Full    Dental  (+) Dental Advisory Given, Implants   Pulmonary sleep apnea , COPD, Current Smoker and Patient abstained from smoking.,    Pulmonary exam normal        Cardiovascular hypertension, Pt. on medications and Pt. on home beta blockers + Peripheral Vascular Disease  Normal cardiovascular exam     Neuro/Psych negative neurological ROS  negative psych ROS   GI/Hepatic negative GI ROS, Neg liver ROS,   Endo/Other  negative endocrine ROS  Renal/GU CRFRenal disease Bladder dysfunction      Musculoskeletal  (+) Arthritis ,   Abdominal   Peds  Hematology negative hematology ROS (+)   Anesthesia Other Findings   Reproductive/Obstetrics                            Anesthesia Physical Anesthesia Plan  ASA: 3  Anesthesia Plan: General   Post-op Pain Management: Tylenol PO (pre-op)*   Induction: Intravenous  PONV Risk Score and Plan: 2 and Treatment may vary due to age or medical condition, Ondansetron and Propofol infusion  Airway Management Planned: Oral ETT  Additional Equipment: None  Intra-op Plan:   Post-operative Plan: Extubation in OR  Informed Consent: I have reviewed the patients History and Physical, chart, labs and discussed the procedure including the risks, benefits and alternatives for the proposed anesthesia with the patient or authorized representative who has indicated his/her understanding and acceptance.     Dental advisory given  Plan Discussed with: CRNA and  Anesthesiologist  Anesthesia Plan Comments:        Anesthesia Quick Evaluation

## 2022-01-02 NOTE — H&P (Signed)
Office Visit Report     12/14/2021   --------------------------------------------------------------------------------   Jeremy [name]  MRN: [id number]  DOB: 07-31-1941, 81 year old Male  SSN: [id number]   PRIMARY CARE:  Jeremy [name] (retired), MD  REFERRING:  Jeremy [name], Jeremy [name]  PROVIDER:  Raynelle [name], M.D.  LOCATION:  [facility] [phone]     --------------------------------------------------------------------------------   CC/HPI: 1. Bladder cancer  2. BPH with incomplete bladder emptying   Jeremy [name] follows up today for surveillance cystoscopy due to a history of bladder cancer. He states that he has noted a small amount of gross blood when catheterizing recently although it has not amounted to anything. His urine is typically been mostly clear. He continues to catheterize approximately twice per day. He has not noted any clinical urinary tract infections over the past year. He denies incontinence.     ALLERGIES: Bactrim TABS Penicillins Sulfa Drugs    MEDICATIONS: Metoprolol Succinate  Simvastatin  Tamsulosin Hcl 0.4 mg capsule 2 capsule PO Q HS  Amlodipine Besilate  B Complex  Cozaar 100 mg tablet  Multivitamin  Olopatadine Hcl 0.1 % drops  Preservision Lutein     [name] PSH: Complex cystometrogram, w/ void pressure and urethral pressure profile studies, any technique - 2019, 2017 Complex Uroflow - 2019, 2017 Cystoscopy - 12/20/2020, 12/29/2019, 2020, 2019, 2018, 2017 Emg surf Electrd - 2019, 2017 Inject For cystogram - 2019, 2017 Intrabd voidng Press - 2019, 2017     NON-[name] PSH: Back Surgery (Unspecified)     [name] PMH: BPH w/LUTS - 12/20/2020, Benign prostatic hyperplasia (BPH) with straining on urination, - 2017, Incomplete emptying of bladder due to benign prostatic hyperplasia, - 2017 Elevated PSA - 12/20/2020, (Stable), - 12/29/2019, - 2017, Elevated prostate specific antigen (PSA), - 2017 History of bladder cancer - 12/20/2020, -  2018 Incomplete bladder emptying - 12/20/2020, - 2017 Bladder Cancer overlapping sites, Malignant neoplasm of overlapping sites of bladder - 2017 Urinary Tract Inf, Unspec site, Urinary tract infection - 2017 ED due to arterial insufficiency, Erectile dysfunction due to arterial insufficiency - 2017 Encounter for Prostate Cancer screening, Prostate cancer screening - 2017      PMH Notes:   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 He then had a low-grade, Ta papillary lesion resected in 04/2004, and then the last recurrence he had was 05/2005 also low grade, Ta.   2) Erectile dysfunction: He has previously responded to PDE-5 inhibitors but has had side effects.   Current treatment: Levitra 20 mg  Prior treatment: Viagra (stopped because of headache), Cialis (headache), VED - non Rx type (ineffective), ICI (tried once and was effective but decided not to pursue it), MUSE (pain)   3) BPH/LUTS: He noted increased nocturia and frequency in September 2013. I initially evaluated him as a new patient to me in January 2014 and he was found to have controlled subjective symptoms but a PVR of > 600 cc. He started alpha blocker therapy in January 2014. He developed more elevated residuals and worsening renal function in the spring of 2017 and began CIC. Urodynamics revealed a poorly functional detrusor and after reviewing treatment options, Jeremy [name] wished to continue CIC management.   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 He has no family history of prostate cancer.        NON-[name] PMH: Bacteriuria (Stable) - 12/20/2020, (Stable), - 2020, Bacteriuria, asymptomatic, - 2017 Hypercholesterolemia Hypertension Sleep Apnea    FAMILY HISTORY: No pertinent family history - Other Prostate Cancer - No Family History   SOCIAL HISTORY: Marital Status: Married Preferred Language: English; Ethnicity: Not Hispanic Or Latino; Race: White Current Smoking Status: Patient smokes. Smokes 1/2 pack per day.   Tobacco Use Assessment Completed: Used Tobacco in last 30 days? Patient's occupation is/was retired.     Notes: Current every day smoker, Tobacco Use, Occupation:, Marital History - Currently Married, Alcohol Use 2 sons   REVIEW OF SYSTEMS:    [name] Review Male:   Patient denies stream starts and stops, get up at night to urinate, have to strain to urinate , frequent urination, hard to postpone urination, burning/ pain with urination, leakage of urine, and trouble starting your streams.  Gastrointestinal (Lower):   Patient denies diarrhea and constipation.  Gastrointestinal (Upper):   Patient denies nausea and vomiting.  Constitutional:   Patient denies fever, night sweats, weight loss, and fatigue.  Skin:   Patient denies skin rash/ lesion and itching.  Eyes:   Patient denies blurred vision and double vision.  Ears/ Nose/ Throat:   Patient denies sore throat and sinus problems.  Hematologic/Lymphatic:   Patient denies swollen glands and easy bruising.  Cardiovascular:   Patient denies leg swelling and chest pains.  Respiratory:   Patient denies cough and shortness of breath.  Endocrine:   Patient denies excessive thirst.  Musculoskeletal:   Patient denies back pain and joint pain.  Neurological:   Patient denies headaches and dizziness.  Psychologic:   Patient denies depression and anxiety.   VITAL SIGNS:      12/14/2021 01:07 PM  Weight 187 lb / 84.82 kg  Height 70 in / 177.8 cm  BP 176/84 mmHg  Pulse 73 /min  Temperature 98.2 F / 36.7 C  BMI 26.8 kg/m   [name]  PHYSICAL EXAMINATION:    Urethral Meatus: Normal size. No lesion, no wart, no discharge, no polyp. Normal location.   MULTI-SYSTEM PHYSICAL EXAMINATION:    Constitutional: Well-nourished. No physical deformities. Normally developed. Good grooming.  Respiratory: No labored breathing, no use of accessory muscles. Clear bilaterally.  Cardiovascular: Normal temperature, normal extremity pulses, no swelling, no varicosities. Regular rate and rhythm.     Complexity of Data:  Records Review:   Previous Patient Records   04/29/16 03/02/16 02/17/15 02/23/14 07/22/13 11/13/12 04/12/10 09/23/08  PSA  Total PSA 2.14  4.31  1.73  1.86  2.05  4.46  3.10  2.84   Free PSA  1.54     1.34     % Free PSA  36     30       11/21/06  Hormones  Testosterone, Total 4.30     PROCEDURES:         Flexible Cystoscopy - 52000  Indication: Bladder cancer Risks, benefits, and potential complications of the procedure were discussed with the patient including infection, bleeding, voiding discomfort, urinary retention, fever, chills, sepsis, and others. All questions were answered. Informed consent was obtained. Sterile technique and intraurethral analgesia were used.  Meatus:  Normal size. Normal location. Normal condition.  Urethra:  No strictures.  External Sphincter:  Normal.  Verumontanum:  Normal.  Prostate:  Obstructing. Severe hyperplasia.  Bladder Neck:  Non-obstructing.  Ureteral Orifices:  Normal location. Normal size. Normal shape. Effluxed clear urine.  Bladder:  Systematic examination of the bladder was performed. Visualization was somewhat obscured due to cloudy/bloody urine. Inspection did reveal a very large amount of erythematous and raised tissue particularly anteriorly and extending laterally on either side. This was consistent with either inflammation versus possible high-grade malignancy. A bladder washing was obtained for cytology.      Chaperone: SM The procedure was well-tolerated and  without complications. Instructions were given to call the office immediately if questions or problems.         Urinalysis w/Scope Dipstick Dipstick Cont'd Micro  Color: Brown Bilirubin: Neg mg/dL WBC/hpf: 6 - 10/hpf  Appearance: Cloudy Ketones: Neg mg/dL RBC/hpf: >60/hpf  Specific Gravity: 1.025 Blood: 3+ ery/uL Bacteria: Rare (0-9/hpf)  pH: 6.0 Protein: 3+ mg/dL Cystals: NS (Not Seen)  Glucose: Neg mg/dL Urobilinogen: 0.2 mg/dL Casts: NS (Not Seen)    Nitrites: Neg Trichomonas: Not Present    Leukocyte Esterase: Trace leu/uL Mucous: Not Present      Epithelial Cells: 0 - 5/hpf      Yeast: NS (Not Seen)      Sperm: Not Present    ASSESSMENT:      ICD-10 Details  1 [name]:   Bladder tumor/neoplasm - D41.4   2   History of bladder cancer - Z85.51   3   BPH w/LUTS - N40.1   4   Incomplete bladder emptying - R39.14    PLAN:           Orders Labs Urine Culture          Schedule Return Visit/Planned Activity: Other See Visit Notes             Note: Will call to schedule surgery.          Document Letter(s):  Created for Patient: Clinical Summary         Notes:   1. Bladder cancer/abnormal cystoscopy: We discussed his cystoscopic results today which are concerning. A bladder washing has been obtained for cytology. I have recommended that he proceed with cystoscopy under anesthesia and bladder biopsy/transurethral resection for further definitive diagnostic purposes. I will also perform bilateral retrograde pyelograms at that time considering his hematuria. We have reviewed this procedure in detail today including the potential risks, complications, and expected recovery process. He gives informed consent to proceed.   2. BPH with incomplete bladder emptying: He will continue catheterizing 2-3 times daily.   cc: Dr. [name]    * Signed by Jeremy [name], M.D. on 12/14/21 at 9:57 PM (EST)*

## 2022-01-03 ENCOUNTER — Encounter (HOSPITAL_COMMUNITY): Payer: Self-pay | Admitting: Urology

## 2022-01-03 ENCOUNTER — Ambulatory Visit (HOSPITAL_COMMUNITY): Payer: Medicare Other | Admitting: Physician Assistant

## 2022-01-03 ENCOUNTER — Ambulatory Visit (HOSPITAL_COMMUNITY): Payer: Medicare Other

## 2022-01-03 ENCOUNTER — Ambulatory Visit (HOSPITAL_BASED_OUTPATIENT_CLINIC_OR_DEPARTMENT_OTHER): Payer: Medicare Other | Admitting: Anesthesiology

## 2022-01-03 ENCOUNTER — Ambulatory Visit (HOSPITAL_COMMUNITY)
Admission: RE | Admit: 2022-01-03 | Discharge: 2022-01-03 | Disposition: A | Discharge status: Home / Self Care | Payer: Medicare Other | Attending: Urology | Admitting: Urology

## 2022-01-03 ENCOUNTER — Ambulatory Visit: Payer: Medicare Other | Admitting: Family Medicine

## 2022-01-03 ENCOUNTER — Encounter (HOSPITAL_COMMUNITY)
Admission: RE | Disposition: A | Discharge status: Home / Self Care | Payer: Self-pay | Source: Home / Self Care | Attending: Urology

## 2022-01-03 DIAGNOSIS — N529 Male erectile dysfunction, unspecified: Secondary | ICD-10-CM | POA: Diagnosis not present

## 2022-01-03 DIAGNOSIS — I129 Hypertensive chronic kidney disease with stage 1 through stage 4 chronic kidney disease, or unspecified chronic kidney disease: Secondary | ICD-10-CM | POA: Diagnosis not present

## 2022-01-03 DIAGNOSIS — N3289 Other specified disorders of bladder: Secondary | ICD-10-CM | POA: Diagnosis not present

## 2022-01-03 DIAGNOSIS — N401 Enlarged prostate with lower urinary tract symptoms: Secondary | ICD-10-CM | POA: Insufficient documentation

## 2022-01-03 DIAGNOSIS — Z8551 Personal history of malignant neoplasm of bladder: Secondary | ICD-10-CM | POA: Diagnosis not present

## 2022-01-03 DIAGNOSIS — N319 Neuromuscular dysfunction of bladder, unspecified: Secondary | ICD-10-CM | POA: Diagnosis not present

## 2022-01-03 DIAGNOSIS — J449 Chronic obstructive pulmonary disease, unspecified: Secondary | ICD-10-CM | POA: Insufficient documentation

## 2022-01-03 DIAGNOSIS — G4730 Sleep apnea, unspecified: Secondary | ICD-10-CM | POA: Diagnosis not present

## 2022-01-03 DIAGNOSIS — I10 Essential (primary) hypertension: Secondary | ICD-10-CM | POA: Insufficient documentation

## 2022-01-03 DIAGNOSIS — F17210 Nicotine dependence, cigarettes, uncomplicated: Secondary | ICD-10-CM | POA: Insufficient documentation

## 2022-01-03 DIAGNOSIS — N3020 Other chronic cystitis without hematuria: Secondary | ICD-10-CM | POA: Diagnosis not present

## 2022-01-03 DIAGNOSIS — N2889 Other specified disorders of kidney and ureter: Secondary | ICD-10-CM | POA: Diagnosis not present

## 2022-01-03 DIAGNOSIS — D303 Benign neoplasm of bladder: Secondary | ICD-10-CM | POA: Diagnosis not present

## 2022-01-03 DIAGNOSIS — N189 Chronic kidney disease, unspecified: Secondary | ICD-10-CM | POA: Diagnosis not present

## 2022-01-03 HISTORY — PX: TRANSURETHRAL RESECTION OF BLADDER TUMOR: SHX2575

## 2022-01-03 SURGERY — TURBT (TRANSURETHRAL RESECTION OF BLADDER TUMOR)
Anesthesia: General | Site: Bladder

## 2022-01-03 MED ORDER — IOHEXOL 300 MG/ML  SOLN
INTRAMUSCULAR | Status: DC | PRN
  Administered 2022-01-03: 30 mL

## 2022-01-03 MED ORDER — PHENYLEPHRINE 40 MCG/ML (10ML) SYRINGE FOR IV PUSH (FOR BLOOD PRESSURE SUPPORT)
PREFILLED_SYRINGE | INTRAVENOUS | Status: AC
  Filled 2022-01-03: qty 10

## 2022-01-03 MED ORDER — LIDOCAINE HCL (PF) 2 % IJ SOLN
INTRAMUSCULAR | Status: AC
  Filled 2022-01-03: qty 5

## 2022-01-03 MED ORDER — 0.9 % SODIUM CHLORIDE (POUR BTL) OPTIME
TOPICAL | Status: DC | PRN
  Administered 2022-01-03: 1000 mL

## 2022-01-03 MED ORDER — ACETAMINOPHEN 500 MG PO TABS
1000.0000 mg | ORAL_TABLET | Freq: Once | ORAL | Status: AC
  Administered 2022-01-03: 1000 mg via ORAL
  Filled 2022-01-03: qty 2

## 2022-01-03 MED ORDER — DEXAMETHASONE SODIUM PHOSPHATE 10 MG/ML IJ SOLN
INTRAMUSCULAR | Status: DC | PRN
  Administered 2022-01-03: 4 mg via INTRAVENOUS

## 2022-01-03 MED ORDER — SODIUM CHLORIDE 0.9 % IR SOLN
Status: DC | PRN
Start: 2022-01-03 — End: 2022-01-03
  Administered 2022-01-03: 3000 mL

## 2022-01-03 MED ORDER — LIDOCAINE 2% (20 MG/ML) 5 ML SYRINGE
INTRAMUSCULAR | Status: DC | PRN
  Administered 2022-01-03: 80 mg via INTRAVENOUS

## 2022-01-03 MED ORDER — ROCURONIUM BROMIDE 10 MG/ML (PF) SYRINGE
PREFILLED_SYRINGE | INTRAVENOUS | Status: DC | PRN
  Administered 2022-01-03: 50 mg via INTRAVENOUS
  Administered 2022-01-03: 10 mg via INTRAVENOUS

## 2022-01-03 MED ORDER — LACTATED RINGERS IV SOLN
INTRAVENOUS | Status: DC
Start: 2022-01-03 — End: 2022-01-03

## 2022-01-03 MED ORDER — OXYCODONE HCL 5 MG PO TABS: 5.0000 mg | ORAL_TABLET | Freq: Once | ORAL | Status: DC | PRN

## 2022-01-03 MED ORDER — ONDANSETRON HCL 4 MG/2ML IJ SOLN: 4.0000 mg | Freq: Once | INTRAMUSCULAR | Status: DC | PRN

## 2022-01-03 MED ORDER — DEXAMETHASONE SODIUM PHOSPHATE 10 MG/ML IJ SOLN
INTRAMUSCULAR | Status: AC
  Filled 2022-01-03: qty 1

## 2022-01-03 MED ORDER — PROPOFOL 10 MG/ML IV BOLUS
INTRAVENOUS | Status: AC
  Filled 2022-01-03: qty 20

## 2022-01-03 MED ORDER — PROPOFOL 10 MG/ML IV BOLUS
INTRAVENOUS | Status: DC | PRN
Start: 2022-01-03 — End: 2022-01-03
  Administered 2022-01-03: 120 mg via INTRAVENOUS
  Administered 2022-01-03: 50 mg via INTRAVENOUS

## 2022-01-03 MED ORDER — PHENYLEPHRINE HCL-NACL 20-0.9 MG/250ML-% IV SOLN
INTRAVENOUS | Status: AC
  Filled 2022-01-03: qty 250

## 2022-01-03 MED ORDER — FENTANYL CITRATE PF 50 MCG/ML IJ SOSY: 25.0000 ug | PREFILLED_SYRINGE | INTRAMUSCULAR | Status: DC | PRN

## 2022-01-03 MED ORDER — OXYCODONE HCL 5 MG/5ML PO SOLN: 5.0000 mg | Freq: Once | ORAL | Status: DC | PRN

## 2022-01-03 MED ORDER — ROCURONIUM BROMIDE 10 MG/ML (PF) SYRINGE
PREFILLED_SYRINGE | INTRAVENOUS | Status: AC
  Filled 2022-01-03: qty 10

## 2022-01-03 MED ORDER — SUGAMMADEX SODIUM 200 MG/2ML IV SOLN
INTRAVENOUS | Status: DC | PRN
  Administered 2022-01-03: 400 mg via INTRAVENOUS

## 2022-01-03 MED ORDER — ORAL CARE MOUTH RINSE: 15.0000 mL | Freq: Once | OROMUCOSAL | Status: AC

## 2022-01-03 MED ORDER — CHLORHEXIDINE GLUCONATE 0.12 % MT SOLN
15.0000 mL | Freq: Once | OROMUCOSAL | Status: AC
  Administered 2022-01-03: 15 mL via OROMUCOSAL

## 2022-01-03 MED ORDER — ONDANSETRON HCL 4 MG/2ML IJ SOLN
INTRAMUSCULAR | Status: AC
  Filled 2022-01-03: qty 2

## 2022-01-03 MED ORDER — CIPROFLOXACIN IN D5W 400 MG/200ML IV SOLN
400.0000 mg | INTRAVENOUS | Status: AC
  Administered 2022-01-03: 400 mg via INTRAVENOUS
  Filled 2022-01-03: qty 200

## 2022-01-03 MED ORDER — ONDANSETRON HCL 4 MG/2ML IJ SOLN
INTRAMUSCULAR | Status: DC | PRN
  Administered 2022-01-03: 4 mg via INTRAVENOUS

## 2022-01-03 SURGICAL SUPPLY — 15 items
BAG URINE DRAIN 2000ML AR STRL (UROLOGICAL SUPPLIES) IMPLANT
BAG URO CATCHER STRL LF (MISCELLANEOUS) ×2 IMPLANT
DRAPE FOOT SWITCH (DRAPES) ×2 IMPLANT
ELECT REM PT RETURN 15FT ADLT (MISCELLANEOUS) ×2 IMPLANT
GLOVE SURG ENC TEXT LTX SZ7.5 (GLOVE) ×2 IMPLANT
GOWN STRL REUS W/TWL LRG LVL3 (GOWN DISPOSABLE) ×2 IMPLANT
KIT TURNOVER KIT A (KITS) IMPLANT
LOOP CUT BIPOLAR 24F LRG (ELECTROSURGICAL) IMPLANT
MANIFOLD NEPTUNE II (INSTRUMENTS) ×2 IMPLANT
PACK CYSTO (CUSTOM PROCEDURE TRAY) ×2 IMPLANT
PENCIL SMOKE EVACUATOR (MISCELLANEOUS) IMPLANT
SYR TOOMEY IRRIG 70ML (MISCELLANEOUS) IMPLANT
SYRINGE TOOMEY IRRIG 70ML (MISCELLANEOUS) IMPLANT
TUBING CONNECTING 10 (TUBING) ×2 IMPLANT
TUBING UROLOGY SET (TUBING) ×2 IMPLANT

## 2022-01-03 NOTE — Anesthesia Procedure Notes (Signed)
Procedure Name: Intubation Date/Time: 01/03/2022 7:28 AM Performed by: Rosaland Lao, CRNA Pre-anesthesia Checklist: Patient identified, Emergency Drugs available, Suction available and Patient being monitored Patient Re-evaluated:Patient Re-evaluated prior to induction Oxygen Delivery Method: Circle system utilized Preoxygenation: Pre-oxygenation with 100% oxygen Induction Type: IV induction Ventilation: Two handed mask ventilation required Laryngoscope Size: Miller and 3 Grade View: Grade I Tube type: Oral Tube size: 7.0 mm Number of attempts: 1 Airway Equipment and Method: Stylet Placement Confirmation: ETT inserted through vocal cords under direct vision, positive ETCO2 and breath sounds checked- equal and bilateral Secured at: 23 cm Tube secured with: Tape Dental Injury: Teeth and Oropharynx as per pre-operative assessment

## 2022-01-03 NOTE — Transfer of Care (Signed)
Immediate Anesthesia Transfer of Care Note  Patient: Jeremy [name]  Procedure(s) Performed: TRANSURETHRAL RESECTION OF BLADDER TUMOR (TURBT)/ CYSTOSCOPY/ EXAM UNDER ANESTHESIA, BILATERAL RETROGRADE/ BLADDER BIOPSIES (Bladder)  Patient Location: PACU  Anesthesia Type:General  Level of Consciousness: awake, alert  and oriented  Airway & Oxygen Therapy: Patient Spontanous Breathing and Patient connected to face mask  Post-op Assessment: Report given to RN and Post -op Vital signs reviewed and stable  Post vital signs: Reviewed and stable  Last Vitals:  Vitals Value Taken Time  BP 129/53 01/03/22 0821  Temp    Pulse 69 01/03/22 0824  Resp 15 01/03/22 0824  SpO2 96 % 01/03/22 0824  Vitals shown include unvalidated device data.  Last Pain:  Vitals:   01/03/22 0549  TempSrc:   PainSc: 0-No pain         Complications: No notable events documented.

## 2022-01-03 NOTE — Interval H&P Note (Signed)
History and Physical Interval Note:  01/03/2022 6:52 AM  Jeremy [name]  has presented today for surgery, with the diagnosis of BLADDER TUMOR.  The various methods of treatment have been discussed with the patient and family. After consideration of risks, benefits and other options for treatment, the patient has consented to  Procedure(s) with comments: TRANSURETHRAL RESECTION OF BLADDER TUMOR (TURBT)/ CYSTOSCOPY/ EXAM UNDER ANESTHESIA, BILATERAL RETROGRADE/ BLADDER BIOPSIES (N/A) - GENERAL ANESTHESIA WITH PARALYSIS as a surgical intervention.  The patient's history has been reviewed, patient examined, no change in status, stable for surgery.  I have reviewed the patient's chart and labs.  Questions were answered to the patient's satisfaction.     [employer]

## 2022-01-03 NOTE — Discharge Instructions (Signed)
You may see some blood in the urine and may have some burning with urination for 48-72 hours. You also may notice that you have to urinate more frequently or urgently after your procedure which is normal.  You should call should you develop an inability urinate, fever > 101, persistent nausea and vomiting that prevents you from eating or drinking to stay hydrated.  You may resume Naproxen in 4-5 days if no blood in the urine.

## 2022-01-03 NOTE — Op Note (Signed)
Preoperative diagnosis: 1.  History of urothelial carcinoma of the bladder 2.  Diffuse bladder erythematous changes  Postoperative diagnosis: 1.  History of urothelial carcinoma of the bladder 2.  Diffuse bladder erythematous changes  Procedures: 1.  Cystoscopy 2.  Pelvic exam under anesthesia 3.  Bilateral retrograde pyelography with interpretation 4.  Site selected transurethral bladder biopsies  Surgeon: Pryor Curia MD  Anesthesia: General  Complications: None  EBL: Minimal  Specimens: 1.  Right lateral bladder biopsies 2.  Left lateral bladder biopsies 3.  Posterior bladder biopsies  Disposition of specimens: Pathology  Intraoperative findings: Bilateral retrograde pyelography was performed with a 6 French ureteral catheter and Omnipaque contrast.  This revealed bilateral J hooking of the ureters consistent with his benign prostatic hyperplasia.  Although there was mild dilation and tortuosity of the ureters, no hydronephrosis was noted bilaterally.  No filling defects within the renal collecting system or ureters were noted bilaterally.  Indication: [name] is an 81 year old gentleman with a history of low risk urothelial carcinoma of the bladder.  He recently presented for surveillance cystoscopy and was noted to have diffuse erythematous changes throughout the bladder raising possible concern for carcinoma in situ/bladder malignancy.  It was recommended that he proceed to the operating room for the above urologic procedures for further evaluation.  The potential risks, complications, and expected recovery process was discussed in detail.  Informed consent was obtained.  Description of procedure: The patient was taken to the operating room and a general anesthetic was administered.  He was given preoperative antibiotics, placed in the dorsolithotomy position, and prepped and draped in usual sterile fashion.  Next, a preoperative timeout was performed.   Cystourethroscopy was then performed and demonstrated a normal anterior urethra.  He was noted to have severe bilateral lobe hypertrophy of the prostate with a high bladder neck.  Inspection of the bladder revealed moderate trabeculation throughout the bladder with diffuse and significant erythematous change.  No definite bladder tumors were identified.  The bladder was then emptied and reinspected.  Attention turned to the left ureteral orifice.  This was cannulated with a 6 French ureteral catheter and Omnipaque contrast was injected.  This revealed no evidence of filling defects within the ureter or the left renal collecting system.  An identical procedure was then performed on the contralateral right side.  Again, no filling defects were noted within the ureter or left renal collecting system.  Attention then returned to the bladder.  The cystoscope was then removed and replaced with a 26 French resectoscope.  Using loop cutting resection, site selected biopsy samples were obtained from the right lateral bladder, left lateral bladder, and posterior bladder.  Hemostasis was achieved with bipolar electrocautery.  The bladder was emptied and reinspected.  Hemostasis appeared excellent.  He was administered paralytic agent throughout the procedure and no obturator reflex was noted.  The bladder was then emptied and the procedure was ended.  A bimanual exam was performed at the end of the procedure.  The prostate was enlarged but without nodularity.  No pelvic masses were noted.  He tolerated the procedure well without complications.  He was able to be extubated and transferred to the recovery room in satisfactory condition.

## 2022-01-04 ENCOUNTER — Encounter (HOSPITAL_COMMUNITY): Payer: Self-pay | Admitting: Urology

## 2022-01-04 DIAGNOSIS — L03116 Cellulitis of left lower limb: Secondary | ICD-10-CM | POA: Diagnosis not present

## 2022-01-04 LAB — SURGICAL PATHOLOGY

## 2022-01-04 NOTE — Anesthesia Postprocedure Evaluation (Signed)
Anesthesia Post Note  Patient: RUBLE PUMPHREY  Procedure(s) Performed: TRANSURETHRAL RESECTION OF BLADDER TUMOR (TURBT)/ CYSTOSCOPY/ EXAM UNDER ANESTHESIA, BILATERAL RETROGRADE/ BLADDER BIOPSIES (Bladder)     Patient location during evaluation: PACU Anesthesia Type: General Level of consciousness: awake and alert Pain management: pain level controlled Vital Signs Assessment: post-procedure vital signs reviewed and stable Respiratory status: spontaneous breathing, nonlabored ventilation and respiratory function stable Cardiovascular status: blood pressure returned to baseline and stable Postop Assessment: no apparent nausea or vomiting Anesthetic complications: no   No notable events documented.  Last Vitals:  Vitals:   01/03/22 1100 01/03/22 1104  BP: (!) 148/69 (!) 148/63  Pulse: 73 69  Resp:  18  Temp:  36.6 C  SpO2: 94% (!) 89%    Last Pain:  Vitals:   01/03/22 1104  TempSrc:   PainSc: 0-No pain                 Audry Pili

## 2022-01-07 DIAGNOSIS — L03116 Cellulitis of left lower limb: Secondary | ICD-10-CM | POA: Diagnosis not present

## 2022-01-08 ENCOUNTER — Encounter: Payer: Self-pay | Admitting: Family Medicine

## 2022-01-08 ENCOUNTER — Ambulatory Visit (INDEPENDENT_AMBULATORY_CARE_PROVIDER_SITE_OTHER): Payer: Medicare Other | Admitting: Family Medicine

## 2022-01-08 ENCOUNTER — Other Ambulatory Visit: Payer: Self-pay

## 2022-01-08 VITALS — BP 151/65 | HR 78 | Ht 70.5 in | Wt 175.0 lb

## 2022-01-08 DIAGNOSIS — I10 Essential (primary) hypertension: Secondary | ICD-10-CM | POA: Diagnosis not present

## 2022-01-08 DIAGNOSIS — E785 Hyperlipidemia, unspecified: Secondary | ICD-10-CM | POA: Diagnosis not present

## 2022-01-08 DIAGNOSIS — L03116 Cellulitis of left lower limb: Secondary | ICD-10-CM

## 2022-01-08 DIAGNOSIS — L0390 Cellulitis, unspecified: Secondary | ICD-10-CM | POA: Insufficient documentation

## 2022-01-08 DIAGNOSIS — F17210 Nicotine dependence, cigarettes, uncomplicated: Secondary | ICD-10-CM

## 2022-01-08 NOTE — Progress Notes (Signed)
Jeremy [name] - 81 y.o. male MRN [id number]  Date of birth: [birth year]  Subjective Chief Complaint  Patient presents with   Follow-up   Hypertension    HPI Jeremy [name] is an 81 year old male here today for follow-up of recent urgent care visit.  Reports that a golf cart backed into his legs while playing golf recently.  Had small laceration but did not really think anything of this.  Started to have increased pain with redness that was spreading across the front of the leg.  Seen in urgent care initially and started on doxycycline.  Followed up approximately 1 week later and prescribed additional course of doxycycline and Keflex as this had not resolved.  He denies any significant pain.  He denies fever, chills.  There is no drainage from the wound.  ROS:  A comprehensive ROS was completed and negative except as noted per HPI  Allergies  Allergen Reactions   Sulfa Antibiotics     Rash, light headed, shaking   Penicillins Other (See Comments)    hives severe as a child     Past Medical History:  Diagnosis Date   Cancer ([place])    COPD (chronic obstructive pulmonary disease) ([place])    Enlarged prostate    Hyperlipidemia    Hypertension    Self-catheterizes urinary bladder    pt. does this morning and evening--been doing this since 11/2016   Sleep apnea    not using a cpap machine    Past Surgical History:  Procedure Laterality Date   BACK SURGERY     BLADDER TUMOR EXCISION  2015   benign   BLEPHAROPLASTY  12/2018   LUMBAR LAMINECTOMY/DECOMPRESSION MICRODISCECTOMY Bilateral 08/25/2017   Procedure: Bilateral Lumbar Three- Four, Lumbar Four- Five, Lumbar Five- Sacral One Laminectomy;  Surgeon: Kristeen Miss, MD;  Location: [place] [facility];  Service: Neurosurgery;  Laterality: Bilateral;  Bilateral L3-4 L4-5 L5-S1 Laminectomy   SKIN CANCER EXCISION     Surg x2...   TONSILLECTOMY AND ADENOIDECTOMY     Surg as a child...   TRANSURETHRAL RESECTION OF BLADDER TUMOR N/A 01/03/2022   Procedure:  TRANSURETHRAL RESECTION OF BLADDER TUMOR (TURBT)/ CYSTOSCOPY/ EXAM UNDER ANESTHESIA, BILATERAL RETROGRADE/ BLADDER BIOPSIES;  Surgeon: Raynelle Bring, MD;  Location: [facility];  Service: Urology;  Laterality: N/A;  GENERAL ANESTHESIA WITH PARALYSIS    Social History   Socioeconomic History   Marital status: Married    Spouse name: Not on file   Number of children: Not on file   Years of education: Not on file   Highest education level: Not on file  Occupational History   Not on file  Tobacco Use   Smoking status: Every Day    Packs/day: 0.50    Years: 50.00    Pack years: 25.00    Types: Cigarettes    Start date: 12/29/1961   Smokeless tobacco: Never   Tobacco comments:    uses vapor cigarette-quit vaping 12/14/18- smoking less than 12 a day   Vaping Use   Vaping Use: Never used  Substance and Sexual Activity   Alcohol use: Yes    Comment: couple glasses with dinner daily   Drug use: No   Sexual activity: Not on file  Other Topics Concern   Not on file  Social History Narrative   Lives with wife in a 2 story home.  Has 2 children.  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, commissioning)   Social Determinants of Health   Financial Resource Strain: Not on file  Food Insecurity: Not on file  Transportation Needs: Not on file  Physical Activity: Not on file  Stress: Not on file  Social Connections: Not on file    Family History  Problem Relation Age of Onset   Colon cancer Neg Hx    Stomach cancer Neg Hx    Rectal cancer Neg Hx     Health Maintenance  Topic Date Due   Zoster Vaccines- Shingrix (1 of 2) Never done   COVID-19 Vaccine (4 - Booster for Pfizer series) 09/12/2020   INFLUENZA VACCINE  06/11/2021   TETANUS/TDAP  07/04/2031   Pneumonia Vaccine 84+ Years old  Completed   HPV VACCINES  Aged Out      ----------------------------------------------------------------------------------------------------------------------------------------------------------------------------------------------------------------- Physical Exam BP (!) 151/65    Pulse 78    Ht 5' 10.5" (1.791 m)    Wt 175 lb (79.4 kg)    SpO2 93%    BMI 24.75 kg/m   Physical Exam Constitutional:      Appearance: Normal appearance.  Cardiovascular:     Rate and Rhythm: Normal rate and regular rhythm.     Pulses: Normal pulses.     Heart sounds: Normal heart sounds.  Musculoskeletal:     Cervical back: Neck supple.  Skin:    Comments: Small scabbed over laceration to the left lower extremity.  There is surrounding erythema with mild warmth.  There is an area demarcating borders of original cellulitis.  Erythema has retreated from these borders.  Neurological:     Mental Status: He is alert.    ------------------------------------------------------------------------------------------------------------------------------------------------------------------------------------------------------------------- Assessment and Plan  Cellulitis Continue antibiotics as prescribed by urgent care.  I will plan to follow-up with him in approximately 1 week.  He will let me know if symptoms are worsening.   No orders of the defined types were placed in this encounter.   No follow-ups on file.    This visit occurred during the SARS-CoV-2 public health emergency.  Safety protocols were in place, including screening questions prior to the visit, additional usage of staff PPE, and extensive cleaning of exam room while observing appropriate contact time as indicated for disinfecting solutions.

## 2022-01-08 NOTE — Assessment & Plan Note (Signed)
Continue antibiotics as prescribed by urgent care.  I will plan to follow-up with him in approximately 1 week.  He will let me know if symptoms are worsening.

## 2022-01-15 ENCOUNTER — Ambulatory Visit: Payer: Medicare Other | Admitting: Family Medicine

## 2022-01-16 DIAGNOSIS — R310 Gross hematuria: Secondary | ICD-10-CM | POA: Diagnosis not present

## 2022-01-16 DIAGNOSIS — Z8551 Personal history of malignant neoplasm of bladder: Secondary | ICD-10-CM | POA: Diagnosis not present

## 2022-01-16 DIAGNOSIS — R8271 Bacteriuria: Secondary | ICD-10-CM | POA: Diagnosis not present

## 2022-01-25 ENCOUNTER — Encounter (HOSPITAL_BASED_OUTPATIENT_CLINIC_OR_DEPARTMENT_OTHER): Payer: Medicare Other | Admitting: General Surgery

## 2022-04-11 ENCOUNTER — Other Ambulatory Visit: Payer: Self-pay | Admitting: Family Medicine

## 2022-04-23 ENCOUNTER — Encounter: Payer: Self-pay | Admitting: Family Medicine

## 2022-04-23 ENCOUNTER — Ambulatory Visit (INDEPENDENT_AMBULATORY_CARE_PROVIDER_SITE_OTHER): Payer: Medicare Other | Admitting: Family Medicine

## 2022-04-23 ENCOUNTER — Ambulatory Visit (INDEPENDENT_AMBULATORY_CARE_PROVIDER_SITE_OTHER): Payer: Medicare Other

## 2022-04-23 VITALS — BP 156/73 | HR 72 | Resp 18 | Ht 70.5 in | Wt 166.0 lb

## 2022-04-23 DIAGNOSIS — D692 Other nonthrombocytopenic purpura: Secondary | ICD-10-CM

## 2022-04-23 DIAGNOSIS — M542 Cervicalgia: Secondary | ICD-10-CM | POA: Insufficient documentation

## 2022-04-23 DIAGNOSIS — J411 Mucopurulent chronic bronchitis: Secondary | ICD-10-CM

## 2022-04-23 DIAGNOSIS — L989 Disorder of the skin and subcutaneous tissue, unspecified: Secondary | ICD-10-CM

## 2022-04-23 DIAGNOSIS — N138 Other obstructive and reflux uropathy: Secondary | ICD-10-CM | POA: Diagnosis not present

## 2022-04-23 DIAGNOSIS — N401 Enlarged prostate with lower urinary tract symptoms: Secondary | ICD-10-CM

## 2022-04-23 DIAGNOSIS — R0609 Other forms of dyspnea: Secondary | ICD-10-CM

## 2022-04-23 DIAGNOSIS — I10 Essential (primary) hypertension: Secondary | ICD-10-CM | POA: Diagnosis not present

## 2022-04-23 DIAGNOSIS — E785 Hyperlipidemia, unspecified: Secondary | ICD-10-CM | POA: Diagnosis not present

## 2022-04-23 DIAGNOSIS — F17210 Nicotine dependence, cigarettes, uncomplicated: Secondary | ICD-10-CM

## 2022-04-23 DIAGNOSIS — I700 Atherosclerosis of aorta: Secondary | ICD-10-CM

## 2022-04-23 DIAGNOSIS — R079 Chest pain, unspecified: Secondary | ICD-10-CM | POA: Diagnosis not present

## 2022-04-23 DIAGNOSIS — R0600 Dyspnea, unspecified: Secondary | ICD-10-CM | POA: Diagnosis not present

## 2022-04-23 MED ORDER — METHYLPREDNISOLONE 4 MG PO TBPK: ORAL_TABLET | ORAL | 0 refills | Status: DC

## 2022-04-23 NOTE — Assessment & Plan Note (Signed)
BP remains a little elevated.  Readings at home have been better.  No symptoms related to HTN. Will continue current medications.  Smoking cessation recommended.

## 2022-04-23 NOTE — Assessment & Plan Note (Signed)
Tolerating simvastatin well.  Updating lipid panel.

## 2022-04-23 NOTE — Assessment & Plan Note (Signed)
Lesion suspicious for squamous cell or keratoacanthoma.  Has appt with dermatology.

## 2022-04-23 NOTE — Patient Instructions (Signed)
Great to see you! We'll be in touch with lab and xray results.  Continue current medications.  Add prednisone for a few days for neck pain.

## 2022-04-23 NOTE — Assessment & Plan Note (Signed)
Reassured today.

## 2022-04-23 NOTE — Assessment & Plan Note (Signed)
Updated CXR ordered 

## 2022-04-23 NOTE — Assessment & Plan Note (Signed)
Counseled on smoking cessation.  Continue control of additional risk factors including cholesterol and BP

## 2022-04-23 NOTE — Assessment & Plan Note (Signed)
Xrays of cervical spine ordered.  Work on postural exercises.  Adding medrol dosepak as well.

## 2022-04-23 NOTE — Progress Notes (Signed)
Jeremy [name] - 81 y.o. male MRN [id number]  Date of birth: February 12, 1941  Subjective Chief Complaint  Patient presents with   Hypertension    Follow up. Fasting    Neck Pain    Patient complains of neck pain on the right side of his neck for 2 months off and on.     HPI Jeremy [name] is a 81 y.o. male here today for follow up visit.    Has complaint of some neck pain.  Pain is located on the R side.  He has had this off and on for a couple of months.  Pain does radiate into the shoulder but not down the arm.  Denies weakness, numbness or tingling.    BP is elevated today.  He reports that he is taking prescribed medications.  He has not noted any side effects from current medications.  He denies chest pain, palpitations, headache or vision changes.  He does continue to smoke daily.   Continues on flomax for BPH.  He would like to have PSA updated today.    Continues to tolerate simvastatin for management of HLD.  He denies side effects related to use of this.    ROS:  A comprehensive ROS was completed and negative except as noted per HPI   Allergies  Allergen Reactions   Sulfa Antibiotics     Rash, light headed, shaking   Penicillins Other (See Comments)    hives severe as a child     Past Medical History:  Diagnosis Date   Cancer ([place])    COPD (chronic obstructive pulmonary disease) ([place])    Enlarged prostate    Hyperlipidemia    Hypertension    Self-catheterizes urinary bladder    pt. 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   TONSILLECTOMY AND ADENOIDECTOMY     Surg as a child...   TRANSURETHRAL RESECTION OF BLADDER TUMOR N/A 01/03/2022   Procedure: TRANSURETHRAL RESECTION OF BLADDER TUMOR (TURBT)/ CYSTOSCOPY/ EXAM UNDER ANESTHESIA, BILATERAL RETROGRADE/ BLADDER BIOPSIES;  Surgeon: Raynelle Bring, MD;  Location: [facility];  Service: Urology;  Laterality: N/A;  GENERAL ANESTHESIA WITH PARALYSIS    Social History   Socioeconomic History   Marital status: Married    Spouse name: Not on file   Number of children: Not on file   Years of education: Not on file   Highest education level: Not on file  Occupational History   Not on file  Tobacco Use   Smoking status: Every Day    Packs/day: 0.50    Years: 50.00    Total pack years: 25.00    Types: Cigarettes    Start date: 12/29/1961   Smokeless tobacco: Never   Tobacco comments:    uses vapor cigarette-quit vaping 12/14/18- smoking less than 12 a day   Vaping Use   Vaping Use: Never used  Substance and Sexual Activity   Alcohol use: Yes  Comment: couple glasses with dinner daily   Drug use: No   Sexual activity: Not on file  Other Topics Concern   Not on file  Social History Narrative   Lives with wife in a 2 story home.  Has 2 children.  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, commissioning)   Social Determinants of Health   Financial Resource Strain: Not on file  Food Insecurity: Not on file  Transportation Needs: Not on file  Physical Activity: Not on file  Stress: Not on file  Social Connections: Not on file    Family History  Problem Relation Age of Onset   Colon cancer Neg Hx    Stomach cancer Neg Hx    Rectal cancer Neg Hx     Health Maintenance  Topic Date Due   Zoster Vaccines- Shingrix (1 of 2) Never done   COVID-19 Vaccine (4 - Pfizer series) 09/12/2020   INFLUENZA VACCINE  06/11/2022   TETANUS/TDAP  07/04/2031   Pneumonia Vaccine  54+ Years old  Completed   HPV VACCINES  Aged Out     ----------------------------------------------------------------------------------------------------------------------------------------------------------------------------------------------------------------- Physical Exam BP (!) 169/68   Pulse 72   Resp 18   Ht 5' 10.5" (1.791 m)   Wt 166 lb (75.3 kg)   SpO2 91%   BMI 23.48 kg/m   Physical Exam Constitutional:      Appearance: Normal appearance.  Cardiovascular:     Rate and Rhythm: Normal rate and regular rhythm.  Pulmonary:     Effort: Pulmonary effort is normal.     Breath sounds: Normal breath sounds.  Musculoskeletal:     Cervical back: Normal range of motion and neck supple.  Skin:    Comments: Raised skin lesion, left inner calf.    Neurological:     Mental Status: He is alert.  Psychiatric:        Mood and Affect: Mood normal.        Behavior: Behavior normal.     ------------------------------------------------------------------------------------------------------------------------------------------------------------------------------------------------------------------- Assessment and Plan  Essential hypertension BP remains a little elevated.  Readings at home have been better.  No symptoms related to HTN. Will continue current medications.  Smoking cessation recommended.   Senile purpura ([place]) Reassured today.    Skin lesion Lesion suspicious for squamous cell or keratoacanthoma.  Has appt with dermatology.   Hyperlipidemia Tolerating simvastatin well.  Updating lipid panel.   Neck pain on right side Xrays of cervical spine ordered.  Work on postural exercises.  Adding medrol dosepak as well.   Aortic atherosclerosis ([place]) Counseled on smoking cessation.  Continue control of additional risk factors including cholesterol and BP  Chronic bronchitis Updated CXR ordered.    Meds ordered this encounter  Medications   methylPREDNISolone (MEDROL  DOSEPAK) 4 MG TBPK tablet    Sig: Taper as directed on packaging.    Dispense:  21 tablet    Refill:  0    No follow-ups on file.    This visit occurred during the SARS-CoV-2 public health emergency.  Safety protocols were in place, including screening questions prior to the visit, additional usage of staff PPE, and extensive cleaning of exam room while observing appropriate contact time as indicated for disinfecting solutions.

## 2022-04-24 ENCOUNTER — Other Ambulatory Visit: Payer: Self-pay | Admitting: Family Medicine

## 2022-04-24 DIAGNOSIS — N184 Chronic kidney disease, stage 4 (severe): Secondary | ICD-10-CM

## 2022-04-24 LAB — CBC WITH DIFFERENTIAL/PLATELET
Absolute Monocytes: 672 cells/uL (ref 200–950)
Basophils Absolute: 49 cells/uL (ref 0–200)
Basophils Relative: 0.6 %
Eosinophils Absolute: 446 cells/uL (ref 15–500)
Eosinophils Relative: 5.5 %
HCT: 42.3 % (ref 38.5–50.0)
Hemoglobin: 14.2 g/dL (ref 13.2–17.1)
Lymphs Abs: 1377 cells/uL (ref 850–3900)
MCH: 31.4 pg (ref 27.0–33.0)
MCHC: 33.6 g/dL (ref 32.0–36.0)
MCV: 93.6 fL (ref 80.0–100.0)
MPV: 10.3 fL (ref 7.5–12.5)
Monocytes Relative: 8.3 %
Neutro Abs: 5557 cells/uL (ref 1500–7800)
Neutrophils Relative %: 68.6 %
Platelets: 260 10*3/uL (ref 140–400)
RBC: 4.52 10*6/uL (ref 4.20–5.80)
RDW: 13.4 % (ref 11.0–15.0)
Total Lymphocyte: 17 %
WBC: 8.1 10*3/uL (ref 3.8–10.8)

## 2022-04-24 LAB — COMPLETE METABOLIC PANEL WITH GFR
AG Ratio: 1.4 (calc) (ref 1.0–2.5)
ALT: 12 U/L (ref 9–46)
AST: 10 U/L (ref 10–35)
Albumin: 4.1 g/dL (ref 3.6–5.1)
Alkaline phosphatase (APISO): 54 U/L (ref 35–144)
BUN/Creatinine Ratio: 19 (calc) (ref 6–22)
BUN: 61 mg/dL — ABNORMAL HIGH (ref 7–25)
CO2: 25 mmol/L (ref 20–32)
Calcium: 9.2 mg/dL (ref 8.6–10.3)
Chloride: 103 mmol/L (ref 98–110)
Creat: 3.23 mg/dL — ABNORMAL HIGH (ref 0.70–1.22)
Globulin: 3 g/dL (calc) (ref 1.9–3.7)
Glucose, Bld: 100 mg/dL — ABNORMAL HIGH (ref 65–99)
Potassium: 5 mmol/L (ref 3.5–5.3)
Sodium: 140 mmol/L (ref 135–146)
Total Bilirubin: 0.7 mg/dL (ref 0.2–1.2)
Total Protein: 7.1 g/dL (ref 6.1–8.1)
eGFR: 19 mL/min/{1.73_m2} — ABNORMAL LOW (ref >=60)

## 2022-04-24 LAB — TSH: TSH: 1.23 mIU/L (ref 0.40–4.50)

## 2022-04-24 LAB — LIPID PANEL W/REFLEX DIRECT LDL
Cholesterol: 145 mg/dL (ref <200)
HDL: 74 mg/dL (ref >=40)
LDL Cholesterol (Calc): 53 mg/dL (calc)
Non-HDL Cholesterol (Calc): 71 mg/dL (calc) (ref <130)
Total CHOL/HDL Ratio: 2 (calc) (ref <5.0)
Triglycerides: 92 mg/dL (ref <150)

## 2022-04-24 LAB — PSA: PSA: 3.37 ng/mL (ref <=4.00)

## 2022-04-25 ENCOUNTER — Other Ambulatory Visit: Payer: Self-pay | Admitting: Family Medicine

## 2022-04-25 DIAGNOSIS — N184 Chronic kidney disease, stage 4 (severe): Secondary | ICD-10-CM

## 2022-05-02 ENCOUNTER — Ambulatory Visit (INDEPENDENT_AMBULATORY_CARE_PROVIDER_SITE_OTHER): Payer: Medicare Other

## 2022-05-02 DIAGNOSIS — N281 Cyst of kidney, acquired: Secondary | ICD-10-CM | POA: Diagnosis not present

## 2022-05-02 DIAGNOSIS — N184 Chronic kidney disease, stage 4 (severe): Secondary | ICD-10-CM

## 2022-05-02 DIAGNOSIS — K8020 Calculus of gallbladder without cholecystitis without obstruction: Secondary | ICD-10-CM | POA: Diagnosis not present

## 2022-05-02 DIAGNOSIS — N200 Calculus of kidney: Secondary | ICD-10-CM | POA: Diagnosis not present

## 2022-05-02 DIAGNOSIS — N189 Chronic kidney disease, unspecified: Secondary | ICD-10-CM | POA: Diagnosis not present

## 2022-05-03 DIAGNOSIS — S80812A Abrasion, left lower leg, initial encounter: Secondary | ICD-10-CM | POA: Diagnosis not present

## 2022-05-03 DIAGNOSIS — Z85828 Personal history of other malignant neoplasm of skin: Secondary | ICD-10-CM | POA: Diagnosis not present

## 2022-05-03 DIAGNOSIS — C44729 Squamous cell carcinoma of skin of left lower limb, including hip: Secondary | ICD-10-CM | POA: Diagnosis not present

## 2022-05-03 DIAGNOSIS — L821 Other seborrheic keratosis: Secondary | ICD-10-CM | POA: Diagnosis not present

## 2022-05-03 DIAGNOSIS — L820 Inflamed seborrheic keratosis: Secondary | ICD-10-CM | POA: Diagnosis not present

## 2022-05-09 NOTE — Progress Notes (Unsigned)
Pt has been advised of results. Expressed understanding.   Question: is he to keep the referral appt?

## 2022-06-06 ENCOUNTER — Telehealth: Payer: Self-pay

## 2022-06-06 NOTE — Telephone Encounter (Signed)
Pt and family have been advised of recommendations. ON-call person available over the weekend.

## 2022-06-06 NOTE — Telephone Encounter (Signed)
Pt has been exposed to [place]. Not sick and no symptoms.   Wants to know next course of action.

## 2022-06-13 DIAGNOSIS — R339 Retention of urine, unspecified: Secondary | ICD-10-CM | POA: Diagnosis not present

## 2022-06-13 DIAGNOSIS — N1832 Chronic kidney disease, stage 3b: Secondary | ICD-10-CM | POA: Diagnosis not present

## 2022-06-13 DIAGNOSIS — I129 Hypertensive chronic kidney disease with stage 1 through stage 4 chronic kidney disease, or unspecified chronic kidney disease: Secondary | ICD-10-CM | POA: Diagnosis not present

## 2022-06-13 DIAGNOSIS — N184 Chronic kidney disease, stage 4 (severe): Secondary | ICD-10-CM | POA: Diagnosis not present

## 2022-06-13 DIAGNOSIS — R82998 Other abnormal findings in urine: Secondary | ICD-10-CM | POA: Diagnosis not present

## 2022-06-13 DIAGNOSIS — N390 Urinary tract infection, site not specified: Secondary | ICD-10-CM | POA: Diagnosis not present

## 2022-06-13 DIAGNOSIS — N179 Acute kidney failure, unspecified: Secondary | ICD-10-CM | POA: Diagnosis not present

## 2022-06-13 DIAGNOSIS — R809 Proteinuria, unspecified: Secondary | ICD-10-CM | POA: Diagnosis not present

## 2022-06-17 DIAGNOSIS — Z85828 Personal history of other malignant neoplasm of skin: Secondary | ICD-10-CM | POA: Diagnosis not present

## 2022-06-17 DIAGNOSIS — C44729 Squamous cell carcinoma of skin of left lower limb, including hip: Secondary | ICD-10-CM | POA: Diagnosis not present

## 2022-06-24 DIAGNOSIS — R809 Proteinuria, unspecified: Secondary | ICD-10-CM | POA: Diagnosis not present

## 2022-06-25 DIAGNOSIS — R809 Proteinuria, unspecified: Secondary | ICD-10-CM | POA: Diagnosis not present

## 2022-06-27 DIAGNOSIS — H6123 Impacted cerumen, bilateral: Secondary | ICD-10-CM | POA: Diagnosis not present

## 2022-06-28 ENCOUNTER — Other Ambulatory Visit: Payer: Self-pay | Admitting: Family Medicine

## 2022-07-03 ENCOUNTER — Encounter: Payer: Self-pay | Admitting: General Practice

## 2022-07-04 ENCOUNTER — Encounter (HOSPITAL_BASED_OUTPATIENT_CLINIC_OR_DEPARTMENT_OTHER): Payer: Self-pay

## 2022-07-04 ENCOUNTER — Other Ambulatory Visit: Payer: Self-pay

## 2022-07-04 ENCOUNTER — Emergency Department (HOSPITAL_BASED_OUTPATIENT_CLINIC_OR_DEPARTMENT_OTHER)
Admission: EM | Admit: 2022-07-04 | Discharge: 2022-07-04 | Disposition: A | Discharge status: Home / Self Care | Payer: Medicare Other | Attending: Emergency Medicine | Admitting: Emergency Medicine

## 2022-07-04 ENCOUNTER — Telehealth: Payer: Self-pay

## 2022-07-04 DIAGNOSIS — Z79899 Other long term (current) drug therapy: Secondary | ICD-10-CM | POA: Diagnosis not present

## 2022-07-04 DIAGNOSIS — Z87891 Personal history of nicotine dependence: Secondary | ICD-10-CM | POA: Diagnosis not present

## 2022-07-04 DIAGNOSIS — J449 Chronic obstructive pulmonary disease, unspecified: Secondary | ICD-10-CM | POA: Insufficient documentation

## 2022-07-04 DIAGNOSIS — N3000 Acute cystitis without hematuria: Secondary | ICD-10-CM | POA: Insufficient documentation

## 2022-07-04 DIAGNOSIS — I10 Essential (primary) hypertension: Secondary | ICD-10-CM | POA: Diagnosis not present

## 2022-07-04 DIAGNOSIS — R509 Fever, unspecified: Secondary | ICD-10-CM | POA: Diagnosis present

## 2022-07-04 LAB — URINALYSIS, ROUTINE W REFLEX MICROSCOPIC
Bilirubin Urine: NEGATIVE
Glucose, UA: NEGATIVE mg/dL
Hgb urine dipstick: NEGATIVE
Ketones, ur: NEGATIVE mg/dL
Nitrite: POSITIVE — AB
Protein, ur: >300 mg/dL — AB
Specific Gravity, Urine: 1.011 (ref 1.005–1.030)
pH: 6.5 (ref 5.0–8.0)

## 2022-07-04 MED ORDER — CEPHALEXIN 500 MG PO CAPS: 500.0000 mg | ORAL_CAPSULE | Freq: Four times a day (QID) | ORAL | 0 refills | Status: DC

## 2022-07-04 MED ORDER — CEPHALEXIN 250 MG PO CAPS
500.0000 mg | ORAL_CAPSULE | Freq: Once | ORAL | Status: AC
  Administered 2022-07-04: 500 mg via ORAL
  Filled 2022-07-04: qty 2

## 2022-07-04 NOTE — ED Notes (Signed)
Reviewed results and plan of care with patient and wife.

## 2022-07-04 NOTE — Discharge Instructions (Signed)
Take the antibiotic Keflex as directed.  Urine sent for culture.  Keflex will not work against the infection you will be notified.  Normally takes the culture results a couple days to come back.  Return for any new or worse symptoms like worse fever or worse chills or any vomiting.  Make an appointment follow-up with your doctor.  Tylenol as needed for fevers.

## 2022-07-04 NOTE — Telephone Encounter (Signed)
Patient's wife left a vm msg stating that patient was having a fever. She was unsure if it was being caused by a UTI infection. Patient is under the care of a Urologist. Per wife, patient was being admitted in the ER. She wanted to make the provider aware of patient's condition.

## 2022-07-04 NOTE — ED Provider Notes (Addendum)
[facility] EMERGENCY DEPT Provider Note   CSN: [id number] Arrival date & time: 07/04/22  1655     History  Chief Complaint  Patient presents with   Fever    Jeremy [name] is a 81 y.o. male.  Patient concerned about urinary tract infection.  He self caths.  And earlier today he had a fever and some chills.  Took some Tylenol feels fine.  No nausea or vomiting.  Patient has had events of when he has had sepsis from this last time was March 02, 2018.  He is followed by Dr. Alinda Money from urology.  Patient is allergic to sulfur and penicillin but has had cephalosporins in the past from chart review.  Past medical history significant for hyperlipidemia hypertension sleep apnea enlarged prostate COPD.  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  Patient was an everyday smoker quit in 1963.  And also stated that he had a COVID exposure but had negative COVID test.  No respiratory symptoms.       Home Medications Prior to Admission medications   Medication Sig Start Date End Date Taking? Authorizing Provider  cephALEXin (KEFLEX) 500 MG capsule Take 1 capsule (500 mg total) by mouth 4 (four) times daily. 07/04/22  Yes Fredia Sorrow, MD  amLODipine (NORVASC) 10 MG tablet TAKE 1 TABLET EVERY DAY Patient taking differently: Take 10 mg by mouth every evening. 03/06/21   [name], DO  Camphor-Eucalyptus-Menthol (VICKS VAPORUB EX) Apply 1 application topically at bedtime as needed (congestion).    [provider]  cholecalciferol (VITAMIN D3) 25 MCG (1000 UNIT) tablet Take 1,000 Units by mouth daily.    [provider]  guaiFENesin (MUCINEX) 600 MG 12 hr tablet Take by mouth 2 (two) times daily.    [provider]  losartan (COZAAR) 100 MG tablet Take 1 tablet (100 mg total) by mouth every evening. 04/11/22   [name], DO  METAMUCIL FIBER PO Take 2 capsules by mouth every evening.     [provider]  methylPREDNISolone (MEDROL DOSEPAK) 4 MG TBPK tablet Taper as directed on packaging. 04/23/22   [name], DO  metoprolol succinate (TOPROL-XL) 50 MG 24 hr tablet TAKE 1 TABLET DAILY WITH OR IMMEDIATELY FOLLOWING A MEAL Patient taking differently: Take 50 mg by mouth every evening. 10/15/21   [name], DO  olopatadine (PATANOL) 0.1 % ophthalmic solution Place 1 drop into both eyes daily as needed for dry eyes. 10/09/21   [provider]  simvastatin (ZOCOR) 40 MG tablet TAKE 1 TABLET EVERY DAY 06/28/22   [name], DO  tamsulosin (FLOMAX) 0.4 MG CAPS Take 0.4 mg by mouth at bedtime.    [provider]  triamcinolone cream (KENALOG) 0.1 % Apply 1 application topically daily as needed for rash. 10/09/21   [provider]      Allergies    Sulfa antibiotics and Penicillins    Review of Systems   Review of Systems  Constitutional:  Positive for chills and fever.  HENT:  Negative for ear pain and sore throat.   Eyes:  Negative for pain and visual disturbance.  Respiratory:  Negative for cough and shortness of breath.   Cardiovascular:  Negative for chest pain and palpitations.  Gastrointestinal:  Negative for abdominal pain and vomiting.  Genitourinary:  Negative for dysuria and hematuria.  Musculoskeletal:  Negative for arthralgias and back pain.  Skin:  Negative for color change and rash.  Neurological:  Negative for seizures and syncope.  All other systems reviewed and are negative.   Physical Exam Updated Vital Signs BP (!) 165/88   Pulse 89   Temp 98.3 F (36.8 C)   Resp 17   Ht 1.791 m (5' 10.5")   Wt 75.3 kg   SpO2 92%   BMI 23.48 kg/m  Physical Exam Vitals and nursing note reviewed.  Constitutional:      General: He is not in acute distress.    Appearance: Normal appearance. He is well-developed.  HENT:     Head: Normocephalic and atraumatic.  Eyes:     Extraocular Movements: Extraocular movements  intact.     Conjunctiva/sclera: Conjunctivae normal.     Pupils: Pupils are equal, round, and reactive to light.  Cardiovascular:     Rate and Rhythm: Normal rate and regular rhythm.     Heart sounds: No murmur heard. Pulmonary:     Effort: Pulmonary effort is normal. No respiratory distress.     Breath sounds: Normal breath sounds.  Abdominal:     Palpations: Abdomen is soft.     Tenderness: There is no abdominal tenderness.  Musculoskeletal:        General: No swelling.     Cervical back: Normal range of motion and neck supple.  Skin:    General: Skin is warm and dry.     Capillary Refill: Capillary refill takes less than 2 seconds.  Neurological:     General: No focal deficit present.     Mental Status: He is alert and oriented to person, place, and time.     Cranial Nerves: No cranial nerve deficit.     Sensory: No sensory deficit.     Motor: No weakness.  Psychiatric:        Mood and Affect: Mood normal.     ED Results / Procedures / Treatments   Labs (all labs ordered are listed, but only abnormal results are displayed) Labs Reviewed  URINALYSIS, ROUTINE W REFLEX MICROSCOPIC - Abnormal; Notable for the following components:      Result Value   Protein, ur >300 (*)    Nitrite POSITIVE (*)    Leukocytes,Ua TRACE (*)    Bacteria, UA MANY (*)    All other components within normal limits  URINE CULTURE    EKG None  Radiology No results found.  Procedures Procedures    Medications Ordered in ED Medications  cephALEXin (KEFLEX) capsule 500 mg (500 mg Oral Given 07/04/22 1830)    ED Course/ Medical Decision Making/ A&P                           Medical Decision Making Amount and/or Complexity of Data Reviewed Labs: ordered.  Risk Prescription drug management.   Patient's urinalysis nitrite positive.  So concerning for urinary tract infection does not have a significant white blood cell count.  But there is many bacteria.  We will treat here with  Keflex patient without any reaction.  We will continue Keflex at home.  Patient not meeting any sepsis criteria clinically here based on vital signs.  Patient appears well no acute distress.  Patient will return for any new or worse symptoms.  He will return for worse fevers nausea or vomiting.  Or if not improving over the next couple days.   Final Clinical Impression(s) / ED Diagnoses Final diagnoses:  Acute cystitis without hematuria    Rx / DC Orders ED Discharge  Orders          Ordered    cephALEXin (KEFLEX) 500 MG capsule  4 times daily        07/04/22 1844              Fredia Sorrow, MD 07/04/22 1851    Fredia Sorrow, MD 07/04/22 [phone]

## 2022-07-04 NOTE — ED Triage Notes (Signed)
Patient here POV from Home.  Endorses 102 Fever this AM. Associated with Chills. No Other Discernable Symptoms but states he may have a Bladder Infection due to History of Same.   Tylenol at approximately 1600. Negative COIVD-19 Test.   NAD Noted during Triage. A&Ox4. GCS 15. Ambulatory.

## 2022-07-04 NOTE — ED Notes (Signed)
Reviewed AVS/discharge instruction with patient. Time allotted for and all questions answered. Patient is agreeable for d/c and escorted to ed exit by staff.  Refused update VS at this time, ride is waiting for them.

## 2022-07-06 LAB — URINE CULTURE: Culture: 80000 — AB

## 2022-07-07 ENCOUNTER — Telehealth: Payer: Self-pay | Admitting: *Deleted

## 2022-07-07 NOTE — Telephone Encounter (Signed)
Post ED Visit - Positive Culture Follow-up  Culture report reviewed by antimicrobial stewardship pharmacist: [facility] Team '[]'$  Elenor Quinones, Pharm.D. '[]'$  Heide Guile, Pharm.D., BCPS AQ-ID '[]'$  Parks Neptune, Pharm.D., BCPS '[]'$  Alycia Rossetti, Pharm.D., BCPS '[]'$  [place], Pharm.D., BCPS, AAHIVP '[]'$  Legrand Como, Pharm.D., BCPS, AAHIVP '[]'$  Salome Arnt, PharmD, BCPS '[]'$  Johnnette Gourd, PharmD, BCPS '[]'$  Hughes Better, PharmD, BCPS '[]'$  Leeroy Cha, PharmD '[]'$  Laqueta Linden, PharmD, BCPS '[]'$  Albertina Parr, PharmD  [place] Team '[]'$  Leodis Sias, PharmD '[]'$  Lindell Spar, PharmD '[]'$  Royetta Asal, PharmD '[]'$  Graylin Shiver, Rph '[]'$  Rema Fendt) Glennon Mac, PharmD '[]'$  Arlyn Dunning, PharmD '[]'$  Netta Cedars, PharmD '[]'$  Dia Sitter, PharmD '[]'$  Leone Haven, PharmD '[]'$  Gretta Arab, PharmD '[]'$  Theodis Shove, PharmD '[]'$  Peggyann Juba, PharmD '[]'$  Reuel Boom, PharmD   Positive urine culture Treated with Cephalexin, organism sensitive to the same and no further patient follow-up is required at this time. Asencion Gowda, Pharm D  Harlon Flor [place] 07/07/2022, 12:26 PM

## 2022-07-08 DIAGNOSIS — R8271 Bacteriuria: Secondary | ICD-10-CM | POA: Diagnosis not present

## 2022-07-08 DIAGNOSIS — R3914 Feeling of incomplete bladder emptying: Secondary | ICD-10-CM | POA: Diagnosis not present

## 2022-07-19 ENCOUNTER — Telehealth: Payer: Self-pay

## 2022-07-19 NOTE — Telephone Encounter (Signed)
Pt lvm stating major elevation in BP. States Dr. Royce Macadamia stopped Losartan and started 4-5 new medications. New side effects: dry mouth, coughing, and headaches.  He is concerned about the new regime. Overall, not feeling well.  He would like to restart Losartan. Please advise.

## 2022-07-21 ENCOUNTER — Other Ambulatory Visit: Payer: Self-pay | Admitting: Family Medicine

## 2022-07-21 NOTE — Telephone Encounter (Signed)
I'm sure this was discontinued due to his kidney function.  What medications was he started on?

## 2022-07-23 ENCOUNTER — Encounter: Payer: Self-pay | Admitting: Family Medicine

## 2022-07-23 DIAGNOSIS — Z961 Presence of intraocular lens: Secondary | ICD-10-CM | POA: Diagnosis not present

## 2022-07-23 DIAGNOSIS — H353132 Nonexudative age-related macular degeneration, bilateral, intermediate dry stage: Secondary | ICD-10-CM | POA: Diagnosis not present

## 2022-07-23 DIAGNOSIS — N1832 Chronic kidney disease, stage 3b: Secondary | ICD-10-CM | POA: Diagnosis not present

## 2022-07-23 DIAGNOSIS — H40013 Open angle with borderline findings, low risk, bilateral: Secondary | ICD-10-CM | POA: Diagnosis not present

## 2022-07-23 NOTE — Telephone Encounter (Addendum)
Per Mrs. Stallman, he's feeling much better. BP is decreasing from 200/100's to 169/70.   D/c: clonidine  Start: Losartan '25mg'$  and hydralazine  Pt to call tomorrow or send MyChart msg with actual blood pressure reading and medication verification.

## 2022-07-24 DIAGNOSIS — N179 Acute kidney failure, unspecified: Secondary | ICD-10-CM | POA: Diagnosis not present

## 2022-07-24 DIAGNOSIS — R319 Hematuria, unspecified: Secondary | ICD-10-CM | POA: Diagnosis not present

## 2022-07-24 DIAGNOSIS — R809 Proteinuria, unspecified: Secondary | ICD-10-CM | POA: Diagnosis not present

## 2022-07-24 DIAGNOSIS — N1832 Chronic kidney disease, stage 3b: Secondary | ICD-10-CM | POA: Diagnosis not present

## 2022-07-24 DIAGNOSIS — R339 Retention of urine, unspecified: Secondary | ICD-10-CM | POA: Diagnosis not present

## 2022-07-24 DIAGNOSIS — N390 Urinary tract infection, site not specified: Secondary | ICD-10-CM | POA: Diagnosis not present

## 2022-07-24 DIAGNOSIS — I129 Hypertensive chronic kidney disease with stage 1 through stage 4 chronic kidney disease, or unspecified chronic kidney disease: Secondary | ICD-10-CM | POA: Diagnosis not present

## 2022-08-01 ENCOUNTER — Encounter: Payer: Self-pay | Admitting: Family Medicine

## 2022-08-01 ENCOUNTER — Telehealth (INDEPENDENT_AMBULATORY_CARE_PROVIDER_SITE_OTHER): Payer: Medicare Other | Admitting: Family Medicine

## 2022-08-01 VITALS — BP 116/70 | Ht 70.5 in | Wt 166.0 lb

## 2022-08-01 DIAGNOSIS — R5383 Other fatigue: Secondary | ICD-10-CM | POA: Diagnosis not present

## 2022-08-01 DIAGNOSIS — I10 Essential (primary) hypertension: Secondary | ICD-10-CM

## 2022-08-01 DIAGNOSIS — R410 Disorientation, unspecified: Secondary | ICD-10-CM | POA: Diagnosis not present

## 2022-08-01 NOTE — Progress Notes (Signed)
Taking losartan and it's helping decrease BP. Currently, has a bladder infection and on ABX.   Why is he continuing to get the bladder infection? Using a cath 2 -3 times a day.   Seems confused and tired sometimes due to taking so many blood pressure medications. Wife is concerned that he isn't feeling the same. What time should medications be taken?

## 2022-08-01 NOTE — Progress Notes (Signed)
Jeremy [name] - 81 y.o. male MRN [id number]  Date of birth: 08-08-1941   This visit type was conducted due to national recommendations for restrictions regarding the COVID-19 Pandemic (e.g. social distancing).  This format is felt to be most appropriate for this patient at this time.  All issues noted in this document were discussed and addressed.  No physical exam was performed (except for noted visual exam findings with Video Visits).  I discussed the limitations of evaluation and management by telemedicine and the availability of in person appointments. The patient expressed understanding and agreed to proceed.  I connected withNAME@ on 08/01/22 at 10:30 AM EDT by a video enabled telemedicine application and verified that I am speaking with the correct person using two identifiers.  Present at visit: [name], DO Javier Docker   Patient Location: Home [place] [id number]   Provider location:   [facility]  Chief Complaint  Patient presents with   Medication Problem    HPI  LAIN TETTERTON is a 81 y.o. male who presents via audio/video conferencing for a telehealth visit today.  Following up on medications today.  Reports having some increased fatigue and drowsiness since blood pressure medications were changed.  He was referred to nephrology for this CKD which was worsening.  Blood pressure regimen changed and losartan was initially held.  Current medications are hydralazine 25 mg 3 times daily, amlodipine 5 mg every morning, losartan 100 mg nightly tamsulosin 0.4 mg nightly and Toprol-XL 50 mg nightly.  Blood pressures over the past couple days have been 118/55 and 122/59 today.  Recently found to have UTI at his last nephrology appointment.  Started on cefpodoxime.  Had some confusion which prompted testing for UTI.  Confusion has improved some but still not quite thinking clearly.  Denies fever or chills.  No chest pain or shortness of breath at this  time.    ROS:  A comprehensive ROS was completed and negative except as noted per HPI  Past Medical History:  Diagnosis Date   Cancer ([place])    COPD (chronic obstructive pulmonary disease) (HCC)    Enlarged prostate    Hyperlipidemia    Hypertension    Self-catheterizes urinary bladder    pt. does this morning and evening--been doing this since 11/2016   Sleep apnea    not using a cpap machine    Past Surgical History:  Procedure Laterality Date   BACK SURGERY     BLADDER TUMOR EXCISION  2015   benign   BLEPHAROPLASTY  12/2018   LUMBAR LAMINECTOMY/DECOMPRESSION MICRODISCECTOMY Bilateral 08/25/2017   Procedure: Bilateral Lumbar Three- Four, Lumbar Four- Five, Lumbar Five- Sacral One Laminectomy;  Surgeon: Kristeen Miss, MD;  Location: [place];  Service: Neurosurgery;  Laterality: Bilateral;  Bilateral L3-4 L4-5 L5-S1 Laminectomy   SKIN CANCER EXCISION     Surg x2...   TONSILLECTOMY AND ADENOIDECTOMY     Surg as a child...   TRANSURETHRAL RESECTION OF BLADDER TUMOR N/A 01/03/2022   Procedure: TRANSURETHRAL RESECTION OF BLADDER TUMOR (TURBT)/ CYSTOSCOPY/ EXAM UNDER ANESTHESIA, BILATERAL RETROGRADE/ BLADDER BIOPSIES;  Surgeon: Raynelle Bring, MD;  Location: [facility];  Service: Urology;  Laterality: N/A;  GENERAL ANESTHESIA WITH PARALYSIS    Family History  Problem Relation Age of Onset   Colon cancer Neg Hx    Stomach cancer Neg Hx    Rectal cancer Neg Hx     Social History   Socioeconomic History   Marital status: Married  Spouse name: Not on file   Number of children: Not on file   Years of education: Not on file   Highest education level: Not on file  Occupational History   Not on file  Tobacco Use   Smoking status: Every Day    Packs/day: 0.50    Years: 50.00    Total pack years: 25.00    Types: Cigarettes    Start date: 12/29/1961   Smokeless tobacco: Never   Tobacco comments:    Smokes 1/2 pack a day   Vaping Use   Vaping Use: Never used  Substance and  Sexual Activity   Alcohol use: Yes    Comment: couple glasses with dinner daily   Drug use: No   Sexual activity: Not on file  Other Topics Concern   Not on file  Social History Narrative   Lives with wife in a 2 story home.  Has 2 children.  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, commissioning)   Social Determinants of Health   Financial Resource Strain: Not on file  Food Insecurity: Not on file  Transportation Needs: Not on file  Physical Activity: Not on file  Stress: Not on file  Social Connections: Not on file  Intimate Partner Violence: Not on file     Current Outpatient Medications:    amLODipine (NORVASC) 10 MG tablet, TAKE 1 TABLET EVERY DAY (Patient taking differently: Take 10 mg by mouth every evening.), Disp: 90 tablet, Rfl: 3   Camphor-Eucalyptus-Menthol (VICKS VAPORUB EX), Apply 1 application topically at bedtime as needed (congestion)., Disp: , Rfl:    cephALEXin (KEFLEX) 500 MG capsule, Take 1 capsule (500 mg total) by mouth 4 (four) times daily., Disp: 28 capsule, Rfl: 0   cholecalciferol (VITAMIN D3) 25 MCG (1000 UNIT) tablet, Take 1,000 Units by mouth daily., Disp: , Rfl:    guaiFENesin (MUCINEX) 600 MG 12 hr tablet, Take by mouth 2 (two) times daily., Disp: , Rfl:    losartan (COZAAR) 100 MG tablet, Take 1 tablet (100 mg total) by mouth every evening., Disp: 90 tablet, Rfl: 2   METAMUCIL FIBER PO, Take 2 capsules by mouth every evening., Disp: , Rfl:    methylPREDNISolone (MEDROL DOSEPAK) 4 MG TBPK tablet, Taper as directed on packaging., Disp: 21 tablet, Rfl: 0   metoprolol succinate (TOPROL-XL) 50 MG 24 hr tablet, TAKE 1 TABLET DAILY WITH OR IMMEDIATELY FOLLOWING A MEAL (Patient taking differently: Take 50 mg by mouth every evening.), Disp: 90 tablet, Rfl: 3   olopatadine (PATANOL) 0.1 % ophthalmic solution, Place 1 drop into both eyes daily as needed for dry eyes., Disp: , Rfl:    simvastatin (ZOCOR) 40 MG tablet, TAKE 1 TABLET EVERY DAY, Disp: 90 tablet, Rfl: 1    tamsulosin (FLOMAX) 0.4 MG CAPS, Take 0.4 mg by mouth at bedtime., Disp: , Rfl:    triamcinolone cream (KENALOG) 0.1 %, Apply 1 application topically daily as needed for rash., Disp: , Rfl:   EXAM:  VITALS per patient if applicable: BP 638/75   Ht 5' 10.5" (1.791 m)   Wt 166 lb (75.3 kg)   BMI 23.48 kg/m   GENERAL: alert, oriented, appears well and in no acute distress  HEENT: atraumatic, conjunttiva clear, no obvious abnormalities on inspection of external nose and ears  NECK: normal movements of the head and neck  LUNGS: on inspection no signs of respiratory distress, breathing rate appears normal, no obvious gross SOB, gasping or wheezing  CV: no obvious cyanosis  MS:  moves all visible extremities without noticeable abnormality  PSYCH/NEURO: pleasant and cooperative, no obvious depression or anxiety, speech and thought processing grossly intact  ASSESSMENT AND PLAN:  Discussed the following assessment and plan:  Essential hypertension Blood pressure medications have been changed by his nephrologist recently.  Has had a little more sluggish since this change.  Diastolic is a little on the low side which may be contributing.  We will have him reduce amlodipine to 2.5 mg and take this at bedtime rather than in the morning.  Continue all other medications for now.  Lethargy UTI likely contributing hopefully this will improve some after he completes treatment.  Switching his blood pressure medications up slightly to see if this improves symptoms as well.     I discussed the assessment and treatment plan with the patient. The patient was provided an opportunity to ask questions and all were answered. The patient agreed with the plan and demonstrated an understanding of the instructions.   The patient was advised to call back or seek an in-person evaluation if the symptoms worsen or if the condition fails to improve as anticipated.    [name], DO

## 2022-08-01 NOTE — Assessment & Plan Note (Signed)
Blood pressure medications have been changed by his nephrologist recently.  Has had a little more sluggish since this change.  Diastolic is a little on the low side which may be contributing.  We will have him reduce amlodipine to 2.5 mg and take this at bedtime rather than in the morning.  Continue all other medications for now.

## 2022-08-01 NOTE — Assessment & Plan Note (Signed)
UTI likely contributing hopefully this will improve some after he completes treatment.  Switching his blood pressure medications up slightly to see if this improves symptoms as well.

## 2022-08-13 ENCOUNTER — Telehealth: Payer: Self-pay

## 2022-08-13 NOTE — Telephone Encounter (Signed)
Pt lvm requesting timeframe to wait before taking COVID vaccine after abx.   Per Dr. Zigmund Daniel, pt should wait 1 week.   Pt's spouse has been advised. Pt didn't answer his phone.

## 2022-08-22 DIAGNOSIS — H40013 Open angle with borderline findings, low risk, bilateral: Secondary | ICD-10-CM | POA: Diagnosis not present

## 2022-08-30 DIAGNOSIS — Z23 Encounter for immunization: Secondary | ICD-10-CM | POA: Diagnosis not present

## 2022-09-03 ENCOUNTER — Encounter (INDEPENDENT_AMBULATORY_CARE_PROVIDER_SITE_OTHER): Payer: Medicare Other | Admitting: Ophthalmology

## 2022-09-03 DIAGNOSIS — H353114 Nonexudative age-related macular degeneration, right eye, advanced atrophic with subfoveal involvement: Secondary | ICD-10-CM | POA: Diagnosis not present

## 2022-09-03 DIAGNOSIS — G4733 Obstructive sleep apnea (adult) (pediatric): Secondary | ICD-10-CM | POA: Diagnosis not present

## 2022-09-03 DIAGNOSIS — N1832 Chronic kidney disease, stage 3b: Secondary | ICD-10-CM | POA: Diagnosis not present

## 2022-09-03 DIAGNOSIS — H43813 Vitreous degeneration, bilateral: Secondary | ICD-10-CM | POA: Diagnosis not present

## 2022-09-03 DIAGNOSIS — H353122 Nonexudative age-related macular degeneration, left eye, intermediate dry stage: Secondary | ICD-10-CM | POA: Diagnosis not present

## 2022-09-03 DIAGNOSIS — H35362 Drusen (degenerative) of macula, left eye: Secondary | ICD-10-CM | POA: Diagnosis not present

## 2022-09-11 ENCOUNTER — Other Ambulatory Visit: Payer: Self-pay | Admitting: Nephrology

## 2022-09-11 ENCOUNTER — Encounter: Payer: Self-pay | Admitting: Gastroenterology

## 2022-09-11 ENCOUNTER — Ambulatory Visit
Admission: RE | Admit: 2022-09-11 | Discharge: 2022-09-11 | Disposition: A | Discharge status: Home / Self Care | Payer: Medicare Other | Source: Ambulatory Visit | Attending: Nephrology | Admitting: Nephrology

## 2022-09-11 DIAGNOSIS — N184 Chronic kidney disease, stage 4 (severe): Secondary | ICD-10-CM | POA: Diagnosis not present

## 2022-09-11 DIAGNOSIS — R809 Proteinuria, unspecified: Secondary | ICD-10-CM

## 2022-09-11 DIAGNOSIS — N281 Cyst of kidney, acquired: Secondary | ICD-10-CM | POA: Diagnosis not present

## 2022-09-11 DIAGNOSIS — N179 Acute kidney failure, unspecified: Secondary | ICD-10-CM | POA: Diagnosis not present

## 2022-09-11 DIAGNOSIS — I129 Hypertensive chronic kidney disease with stage 1 through stage 4 chronic kidney disease, or unspecified chronic kidney disease: Secondary | ICD-10-CM

## 2022-09-11 DIAGNOSIS — R319 Hematuria, unspecified: Secondary | ICD-10-CM | POA: Diagnosis not present

## 2022-09-11 DIAGNOSIS — N189 Chronic kidney disease, unspecified: Secondary | ICD-10-CM | POA: Diagnosis not present

## 2022-09-11 DIAGNOSIS — R339 Retention of urine, unspecified: Secondary | ICD-10-CM

## 2022-09-11 DIAGNOSIS — N1832 Chronic kidney disease, stage 3b: Secondary | ICD-10-CM

## 2022-09-12 ENCOUNTER — Other Ambulatory Visit: Payer: Self-pay

## 2022-09-12 ENCOUNTER — Inpatient Hospital Stay (HOSPITAL_COMMUNITY)
Admission: EM | Admit: 2022-09-12 | Discharge: 2022-09-17 | DRG: 682 | Disposition: A | Discharge status: Home / Self Care | Payer: Medicare Other | Attending: Internal Medicine | Admitting: Internal Medicine

## 2022-09-12 DIAGNOSIS — I10 Essential (primary) hypertension: Secondary | ICD-10-CM | POA: Diagnosis present

## 2022-09-12 DIAGNOSIS — Z880 Allergy status to penicillin: Secondary | ICD-10-CM

## 2022-09-12 DIAGNOSIS — J411 Mucopurulent chronic bronchitis: Secondary | ICD-10-CM

## 2022-09-12 DIAGNOSIS — B9620 Unspecified Escherichia coli [E. coli] as the cause of diseases classified elsewhere: Secondary | ICD-10-CM | POA: Diagnosis present

## 2022-09-12 DIAGNOSIS — N390 Urinary tract infection, site not specified: Secondary | ICD-10-CM | POA: Diagnosis not present

## 2022-09-12 DIAGNOSIS — J441 Chronic obstructive pulmonary disease with (acute) exacerbation: Secondary | ICD-10-CM | POA: Diagnosis present

## 2022-09-12 DIAGNOSIS — N179 Acute kidney failure, unspecified: Secondary | ICD-10-CM | POA: Diagnosis not present

## 2022-09-12 DIAGNOSIS — G4733 Obstructive sleep apnea (adult) (pediatric): Secondary | ICD-10-CM | POA: Diagnosis present

## 2022-09-12 DIAGNOSIS — I169 Hypertensive crisis, unspecified: Secondary | ICD-10-CM | POA: Diagnosis not present

## 2022-09-12 DIAGNOSIS — R809 Proteinuria, unspecified: Secondary | ICD-10-CM | POA: Diagnosis present

## 2022-09-12 DIAGNOSIS — I129 Hypertensive chronic kidney disease with stage 1 through stage 4 chronic kidney disease, or unspecified chronic kidney disease: Secondary | ICD-10-CM | POA: Diagnosis present

## 2022-09-12 DIAGNOSIS — N184 Chronic kidney disease, stage 4 (severe): Secondary | ICD-10-CM | POA: Diagnosis present

## 2022-09-12 DIAGNOSIS — Z882 Allergy status to sulfonamides status: Secondary | ICD-10-CM

## 2022-09-12 DIAGNOSIS — G4730 Sleep apnea, unspecified: Secondary | ICD-10-CM | POA: Diagnosis present

## 2022-09-12 DIAGNOSIS — F17210 Nicotine dependence, cigarettes, uncomplicated: Secondary | ICD-10-CM | POA: Diagnosis not present

## 2022-09-12 DIAGNOSIS — R54 Age-related physical debility: Secondary | ICD-10-CM | POA: Diagnosis present

## 2022-09-12 DIAGNOSIS — Z85828 Personal history of other malignant neoplasm of skin: Secondary | ICD-10-CM

## 2022-09-12 DIAGNOSIS — R339 Retention of urine, unspecified: Secondary | ICD-10-CM | POA: Diagnosis not present

## 2022-09-12 DIAGNOSIS — N185 Chronic kidney disease, stage 5: Secondary | ICD-10-CM | POA: Diagnosis not present

## 2022-09-12 DIAGNOSIS — N138 Other obstructive and reflux uropathy: Secondary | ICD-10-CM | POA: Diagnosis present

## 2022-09-12 DIAGNOSIS — Z79899 Other long term (current) drug therapy: Secondary | ICD-10-CM

## 2022-09-12 DIAGNOSIS — R0902 Hypoxemia: Secondary | ICD-10-CM | POA: Diagnosis not present

## 2022-09-12 DIAGNOSIS — D631 Anemia in chronic kidney disease: Secondary | ICD-10-CM | POA: Diagnosis present

## 2022-09-12 DIAGNOSIS — J9601 Acute respiratory failure with hypoxia: Secondary | ICD-10-CM | POA: Diagnosis present

## 2022-09-12 DIAGNOSIS — E785 Hyperlipidemia, unspecified: Secondary | ICD-10-CM | POA: Diagnosis present

## 2022-09-12 DIAGNOSIS — D649 Anemia, unspecified: Secondary | ICD-10-CM | POA: Diagnosis not present

## 2022-09-12 DIAGNOSIS — E875 Hyperkalemia: Secondary | ICD-10-CM | POA: Diagnosis not present

## 2022-09-12 DIAGNOSIS — N401 Enlarged prostate with lower urinary tract symptoms: Secondary | ICD-10-CM | POA: Diagnosis present

## 2022-09-12 DIAGNOSIS — Z8551 Personal history of malignant neoplasm of bladder: Secondary | ICD-10-CM | POA: Diagnosis not present

## 2022-09-12 DIAGNOSIS — J42 Unspecified chronic bronchitis: Secondary | ICD-10-CM | POA: Diagnosis present

## 2022-09-12 LAB — URINALYSIS, ROUTINE W REFLEX MICROSCOPIC
Bilirubin Urine: NEGATIVE
Glucose, UA: NEGATIVE mg/dL
Ketones, ur: NEGATIVE mg/dL
Nitrite: NEGATIVE
Protein, ur: 100 mg/dL — AB
RBC / HPF: >50 RBC/hpf — ABNORMAL HIGH (ref 0–5)
Specific Gravity, Urine: 1.01 (ref 1.005–1.030)
WBC, UA: >50 WBC/hpf — ABNORMAL HIGH (ref 0–5)
pH: 5 (ref 5.0–8.0)

## 2022-09-12 LAB — CBC WITH DIFFERENTIAL/PLATELET
Abs Immature Granulocytes: 0.12 10*3/uL — ABNORMAL HIGH (ref 0.00–0.07)
Basophils Absolute: 0 10*3/uL (ref 0.0–0.1)
Basophils Relative: 1 %
Eosinophils Absolute: 0.1 10*3/uL (ref 0.0–0.5)
Eosinophils Relative: 2 %
HCT: 30.2 % — ABNORMAL LOW (ref 39.0–52.0)
Hemoglobin: 9.4 g/dL — ABNORMAL LOW (ref 13.0–17.0)
Immature Granulocytes: 2 %
Lymphocytes Relative: 17 %
Lymphs Abs: 1.1 10*3/uL (ref 0.7–4.0)
MCH: 30.2 pg (ref 26.0–34.0)
MCHC: 31.1 g/dL (ref 30.0–36.0)
MCV: 97.1 fL (ref 80.0–100.0)
Monocytes Absolute: 0.6 10*3/uL (ref 0.1–1.0)
Monocytes Relative: 9 %
Neutro Abs: 4.4 10*3/uL (ref 1.7–7.7)
Neutrophils Relative %: 69 %
Platelets: 249 10*3/uL (ref 150–400)
RBC: 3.11 MIL/uL — ABNORMAL LOW (ref 4.22–5.81)
RDW: 15.6 % — ABNORMAL HIGH (ref 11.5–15.5)
WBC: 6.3 10*3/uL (ref 4.0–10.5)
nRBC: 0 % (ref 0.0–0.2)

## 2022-09-12 LAB — BASIC METABOLIC PANEL
Anion gap: 14 (ref 5–15)
BUN: 97 mg/dL — ABNORMAL HIGH (ref 8–23)
CO2: 15 mmol/L — ABNORMAL LOW (ref 22–32)
Calcium: 8.2 mg/dL — ABNORMAL LOW (ref 8.9–10.3)
Chloride: 110 mmol/L (ref 98–111)
Creatinine, Ser: 7.82 mg/dL — ABNORMAL HIGH (ref 0.61–1.24)
GFR, Estimated: 6 mL/min — ABNORMAL LOW (ref >60)
Glucose, Bld: 122 mg/dL — ABNORMAL HIGH (ref 70–99)
Potassium: 5.5 mmol/L — ABNORMAL HIGH (ref 3.5–5.1)
Sodium: 139 mmol/L (ref 135–145)

## 2022-09-12 MED ORDER — NICOTINE 21 MG/24HR TD PT24
21.0000 mg | MEDICATED_PATCH | Freq: Once | TRANSDERMAL | Status: AC
  Administered 2022-09-12: 21 mg via TRANSDERMAL
  Filled 2022-09-12: qty 1

## 2022-09-12 MED ORDER — VITAMIN D 25 MCG (1000 UNIT) PO TABS
1000.0000 [IU] | ORAL_TABLET | Freq: Every day | ORAL | Status: DC
  Administered 2022-09-12 – 2022-09-17 (×6): 1000 [IU] via ORAL
  Filled 2022-09-12 (×6): qty 1

## 2022-09-12 MED ORDER — SIMVASTATIN 20 MG PO TABS
40.0000 mg | ORAL_TABLET | Freq: Every day | ORAL | Status: DC
  Administered 2022-09-12 – 2022-09-13 (×2): 40 mg via ORAL
  Filled 2022-09-12 (×2): qty 2

## 2022-09-12 MED ORDER — HYDRALAZINE HCL 20 MG/ML IJ SOLN: 5.0000 mg | Freq: Four times a day (QID) | INTRAMUSCULAR | Status: DC | PRN

## 2022-09-12 MED ORDER — MELATONIN 5 MG PO TABS
5.0000 mg | ORAL_TABLET | Freq: Every day | ORAL | Status: DC
  Administered 2022-09-12 – 2022-09-16 (×5): 5 mg via ORAL
  Filled 2022-09-12 (×6): qty 1

## 2022-09-12 MED ORDER — SODIUM ZIRCONIUM CYCLOSILICATE 10 G PO PACK
10.0000 g | PACK | Freq: Once | ORAL | Status: AC
  Administered 2022-09-12: 10 g via ORAL
  Filled 2022-09-12: qty 1

## 2022-09-12 MED ORDER — ALBUTEROL SULFATE (2.5 MG/3ML) 0.083% IN NEBU
2.5000 mg | INHALATION_SOLUTION | Freq: Four times a day (QID) | RESPIRATORY_TRACT | Status: DC
  Administered 2022-09-12 – 2022-09-14 (×6): 2.5 mg via RESPIRATORY_TRACT
  Filled 2022-09-12 (×6): qty 3

## 2022-09-12 MED ORDER — AMLODIPINE BESYLATE 10 MG PO TABS
10.0000 mg | ORAL_TABLET | Freq: Every evening | ORAL | Status: DC
  Administered 2022-09-12 – 2022-09-16 (×5): 10 mg via ORAL
  Filled 2022-09-12 (×2): qty 1
  Filled 2022-09-12: qty 2
  Filled 2022-09-12 (×2): qty 1

## 2022-09-12 MED ORDER — SODIUM BICARBONATE 8.4 % IV SOLN
INTRAVENOUS | Status: DC
  Filled 2022-09-12 (×7): qty 1000

## 2022-09-12 MED ORDER — OLOPATADINE HCL 0.1 % OP SOLN: 1.0000 [drp] | Freq: Every day | OPHTHALMIC | Status: DC | PRN

## 2022-09-12 MED ORDER — HYDRALAZINE HCL 50 MG PO TABS
50.0000 mg | ORAL_TABLET | Freq: Two times a day (BID) | ORAL | Status: DC
  Administered 2022-09-12 – 2022-09-15 (×6): 50 mg via ORAL
  Filled 2022-09-12 (×6): qty 1

## 2022-09-12 MED ORDER — METOPROLOL SUCCINATE ER 50 MG PO TB24
50.0000 mg | ORAL_TABLET | Freq: Every evening | ORAL | Status: DC
  Administered 2022-09-12 – 2022-09-16 (×5): 50 mg via ORAL
  Filled 2022-09-12 (×3): qty 1
  Filled 2022-09-12: qty 2
  Filled 2022-09-12: qty 1

## 2022-09-12 MED ORDER — TAMSULOSIN HCL 0.4 MG PO CAPS
0.8000 mg | ORAL_CAPSULE | Freq: Every day | ORAL | Status: DC
  Administered 2022-09-12 – 2022-09-16 (×5): 0.8 mg via ORAL
  Filled 2022-09-12 (×5): qty 2

## 2022-09-12 NOTE — ED Notes (Signed)
Nephrology at bedside

## 2022-09-12 NOTE — ED Triage Notes (Signed)
Pt. Stated, I was called this morning with abnormal kidney labs.  Pt self cath due to unable to empty my bladder 3 times a day

## 2022-09-12 NOTE — H&P (Addendum)
TRH H&P   Patient Demographics:    Jeremy [name], is a 81 y.o. male  MRN: [id number]   DOB - [birth year]  Admit Date - 09/12/2022  Outpatient Primary MD for the patient is [name], DO  Referring MD/NP/PA: Dr Marcina Millard  Outpatient Specialists: renal Dr Royce Macadamia    Patient coming from: home  Chief Complaint  Patient presents with   abnormal labs      HPI:    Jeremy [name]  is a 81 y.o. male, with medical history significant of bladder cancer (s/p of excision ), self catheterization, BPH, OSA not on CPAP, hypertension, hyperlipidemia, CKD-4, who was sent by his nephrologist for evaluation of worsening renal function, and is following with Dr. Royce Macadamia, it was noted with increased serum creatinine over the last few weeks, patient on losartan for blood pressure, dose was increased on 07/24/2022, creatinine has been increased to trending up from baseline of 2.9, to 6 on 08/30/2022, he was seen again on 09/11/2022, for which his creatinine went up to 7.36, he was taken off losartan then, and started on hydralazine instead, renal ultrasound 09/11/2022 without evidence of hydronephrosis, patient was sent to ED for further evaluation given his worsening renal function, patient reports he self catheter times a day, he denies any fever, chills or any new complaints, he denies any new NSAIDs use, hematuria, pyuria, or worsening lower extremity edema, no dyspnea or orthopnea. -In ED blood work significant for creatinine of 7.8, potassium of 5.5, EKG still pending, pressure elevated 170/64, [facility] hospitalist consulted to admit.    Review of systems:     A full 10 point Review of Systems was done, except as stated above, all other Review of Systems were negative.   With Past History of the following :    Past Medical History:  Diagnosis Date   Cancer ([place])    COPD (chronic obstructive pulmonary  disease) ([place])    Enlarged prostate    Hyperlipidemia    Hypertension    Self-catheterizes urinary bladder    pt. does this morning and evening--been doing this since 11/2016   Sleep apnea    not using a cpap machine      Past Surgical History:  Procedure Laterality Date   BACK SURGERY     BLADDER TUMOR EXCISION  2015   benign   BLEPHAROPLASTY  12/2018   LUMBAR LAMINECTOMY/DECOMPRESSION MICRODISCECTOMY Bilateral 08/25/2017   Procedure: Bilateral Lumbar Three- Four, Lumbar Four- Five, Lumbar Five- Sacral One Laminectomy;  Surgeon: Kristeen Miss, MD;  Location: [facility];  Service: Neurosurgery;  Laterality: Bilateral;  Bilateral L3-4 L4-5 L5-S1 Laminectomy   SKIN CANCER EXCISION     Surg x2...   TONSILLECTOMY AND ADENOIDECTOMY     Surg as a child...   TRANSURETHRAL RESECTION OF BLADDER TUMOR N/A 01/03/2022   Procedure: TRANSURETHRAL RESECTION OF BLADDER TUMOR (TURBT)/ CYSTOSCOPY/ EXAM UNDER ANESTHESIA, BILATERAL RETROGRADE/ BLADDER  BIOPSIES;  Surgeon: Raynelle Bring, MD;  Location: [facility];  Service: Urology;  Laterality: N/A;  GENERAL ANESTHESIA WITH PARALYSIS      Social History:     Social History   Tobacco Use   Smoking status: Every Day    Packs/day: 0.50    Years: 50.00    Total pack years: 25.00    Types: Cigarettes    Start date: 12/29/1961   Smokeless tobacco: Never   Tobacco comments:    Smokes 1/2 pack a day   Substance Use Topics   Alcohol use: Yes    Comment: couple glasses with dinner daily       Family History :     Family History  Problem Relation Age of Onset   Colon cancer Neg Hx    Stomach cancer Neg Hx    Rectal cancer Neg Hx       Home Medications:   Prior to Admission medications   Medication Sig Start Date End Date Taking? Authorizing Provider  acetaminophen (TYLENOL) 500 MG tablet Take 1,000 mg by mouth every 6 (six) hours as needed for moderate pain.   Yes [provider]  amLODipine (NORVASC) 10 MG tablet TAKE 1 TABLET EVERY  DAY Patient taking differently: Take 10 mg by mouth every evening. 03/06/21  Yes [name], DO  Camphor-Eucalyptus-Menthol (VICKS VAPORUB EX) Apply 1 application topically at bedtime as needed (congestion).   Yes [provider]  cholecalciferol (VITAMIN D3) 25 MCG (1000 UNIT) tablet Take 1,000 Units by mouth daily.   Yes [provider]  diphenhydrAMINE-zinc acetate (BENADRYL) cream Apply 1 Application topically daily as needed for itching.   Yes [provider]  guaiFENesin (MUCINEX) 600 MG 12 hr tablet Take 1,200 mg by mouth 2 (two) times daily as needed for cough.   Yes [provider]  hydrALAZINE (APRESOLINE) 50 MG tablet Take 50 mg by mouth 2 (two) times daily. 09/11/22  Yes [provider]  METAMUCIL FIBER PO Take 2 capsules by mouth every evening.   Yes [provider]  metoprolol succinate (TOPROL-XL) 50 MG 24 hr tablet TAKE 1 TABLET DAILY WITH OR IMMEDIATELY FOLLOWING A MEAL Patient taking differently: Take 50 mg by mouth every evening. 10/15/21  Yes [name], DO  olopatadine (PATANOL) 0.1 % ophthalmic solution Place 1 drop into both eyes daily as needed for dry eyes. 10/09/21  Yes [provider]  simvastatin (ZOCOR) 40 MG tablet TAKE 1 TABLET EVERY DAY Patient taking differently: Take 40 mg by mouth daily. 06/28/22  Yes [name], DO  tamsulosin (FLOMAX) 0.4 MG CAPS Take 0.8 mg by mouth at bedtime.   Yes [provider]  cephALEXin (KEFLEX) 500 MG capsule Take 1 capsule (500 mg total) by mouth 4 (four) times daily. Patient not taking: Reported on 09/12/2022 07/04/22   Fredia Sorrow, MD  losartan (COZAAR) 100 MG tablet Take 1 tablet (100 mg total) by mouth every evening. Patient not taking: Reported on 09/12/2022 04/11/22   [name], DO  methylPREDNISolone (MEDROL DOSEPAK) 4 MG TBPK tablet Taper as directed on packaging. Patient not taking: Reported on 09/12/2022 04/23/22   [name], DO      Allergies:     Allergies  Allergen Reactions   Sulfa Antibiotics     Rash, light headed, shaking   Penicillins Other (See Comments)    hives severe as a child      Physical Exam:   Vitals  Blood pressure (!) 166/62, pulse 70, temperature  97.6 F (36.4 C), temperature source Oral, resp. rate 20, height 5' 10.5" (1.791 m), weight 73.9 kg, SpO2 93 %.   1. General frail elderly male, laying in bed, no apparent distress  2. Normal affect and insight, Not Suicidal or Homicidal, Awake Alert, Oriented X 3.  3. No F.N deficits, ALL C.Nerves Intact, Strength 5/5 all 4 extremities, Sensation intact all 4 extremities, Plantars down going.  4. Ears and Eyes appear Normal, Conjunctivae clear, PERRLA. Moist Oral Mucosa.  5. Supple Neck, No JVD, No cervical lymphadenopathy appriciated, No Carotid Bruits.  6. Symmetrical Chest wall movement, Good air movement bilaterally, scattered wheezing.  7. RRR, No Gallops, Rubs or Murmurs, No Parasternal Heave.  8. Positive Bowel Sounds, Abdomen Soft, No tenderness, No organomegaly appriciated,No rebound -guarding or rigidity.  9.  No Cyanosis, Normal Skin Turgor, No Skin Rash or Bruise.  10. Good muscle tone,  joints appear normal , no effusions, Normal ROM.    Data Review:    CBC Recent Labs  Lab 09/12/22 1135  WBC 6.3  HGB 9.4*  HCT 30.2*  PLT 249  MCV 97.1  MCH 30.2  MCHC 31.1  RDW 15.6*  LYMPHSABS 1.1  MONOABS 0.6  EOSABS 0.1  BASOSABS 0.0   ------------------------------------------------------------------------------------------------------------------  Chemistries  Recent Labs  Lab 09/12/22 1135  NA 139  K 5.5*  CL 110  CO2 15*  GLUCOSE 122*  BUN 97*  CREATININE 7.82*  CALCIUM 8.2*   ------------------------------------------------------------------------------------------------------------------ estimated creatinine clearance is 7.6 mL/min (A) (by C-G formula based on SCr of 7.82 mg/dL  (H)). ------------------------------------------------------------------------------------------------------------------ No results for input(s): "TSH", "T4TOTAL", "T3FREE", "THYROIDAB" in the last 72 hours.  Invalid input(s): "FREET3"  Coagulation profile No results for input(s): "INR", "PROTIME" in the last 168 hours. ------------------------------------------------------------------------------------------------------------------- No results for input(s): "DDIMER" in the last 72 hours. -------------------------------------------------------------------------------------------------------------------  Cardiac Enzymes No results for input(s): "CKMB", "TROPONINI", "MYOGLOBIN" in the last 168 hours.  Invalid input(s): "CK" ------------------------------------------------------------------------------------------------------------------    Component Value Date/Time   BNP 135.4 (H) 03/03/2018 0227     ---------------------------------------------------------------------------------------------------------------  Urinalysis    Component Value Date/Time   COLORURINE YELLOW 09/12/2022 1533   APPEARANCEUR CLOUDY (A) 09/12/2022 1533   LABSPEC 1.010 09/12/2022 1533   PHURINE 5.0 09/12/2022 1533   GLUCOSEU NEGATIVE 09/12/2022 1533   GLUCOSEU NEGATIVE 01/14/2011 0938   HGBUR MODERATE (A) 09/12/2022 1533   BILIRUBINUR NEGATIVE 09/12/2022 1533   KETONESUR NEGATIVE 09/12/2022 1533   PROTEINUR 100 (A) 09/12/2022 1533   UROBILINOGEN 0.2 07/29/2013 1637   NITRITE NEGATIVE 09/12/2022 1533   LEUKOCYTESUR LARGE (A) 09/12/2022 1533    ----------------------------------------------------------------------------------------------------------------   Imaging Results:    US RENAL  Result Date: 09/11/2022 CLINICAL DATA:  Acute on chronic kidney disease. EXAM: RENAL / URINARY TRACT ULTRASOUND COMPLETE COMPARISON:  May 02, 2022. FINDINGS: Right Kidney: Renal measurements: 10.5 x 4.7 x 3.9 cm =  volume: 99 mL. Two simple cysts are noted, the largest measuring 2.5 cm in upper pole. Increased echogenicity of renal parenchyma is noted. No mass or hydronephrosis visualized. Left Kidney: Renal measurements: 12.1 x 6.2 x 5.8 cm = volume: 228 mL. 1 cm simple cyst is seen in upper pole. Increased echogenicity of renal parenchyma is noted. No mass or hydronephrosis visualized. Bladder: Appears normal for degree of bladder distention. Left ureteral jet is noted. Other: None. IMPRESSION: Increased echogenicity of renal parenchyma is noted bilaterally suggesting medical renal disease. No hydronephrosis or renal obstruction is noted. Electronically Signed   By: Bobbe Medico.D.  On: 09/11/2022 16:54    My personal review of EKG: Pending   Assessment & Plan:    Principal Problem:   AKI (acute kidney injury) ([place]) Active Problems:   History of bladder cancer   Essential hypertension   Chronic bronchitis (HCC)   Benign prostatic hyperplasia with urinary obstruction   OSA (obstructive sleep apnea)   Cigarette smoker   Hyperlipidemia   AKI on CKD stage IV -Baseline creatinine 2.9, with rapid increase over the last few weeks, no evidence of hydronephrosis on renal ultrasound done yesterday, no evidence of urinary retention,. -Serology work-up done at primary nephrologist office is negative -Management per renal, plan for renal artery duplex to rule out artery stenosis given long tobacco use, as well consult has been placed by renal for renal biopsy. -Gentle hydration per renal. -Avoid nephrotoxic medications. -Evidence of proteinuria in the past, plan for 24 hours urine for protein   Hyperkalemia -received Lokelma, monitor on telemetry, recheck BMP in a.m.  Hypertension Blood pressure is elevated, continue with home regimen including Norvasc, hydralazine and metoprolol, certainly will hold losartan - will add prn hydralazine   History of BPH/urinary retention -Patient does self cath at  home, will insert Foley catheter during hospital stay, continue with Flomax  OSA -Not use CPAP at home  Anemia of chronic kidney disease -Renal function at baseline -Iron panel per renal  Hyperlipidemia -Continue with statin  Chronic bronchitis -We will keep on as needed albuterol given some mild wheezing  Tobacco abuse - Continue with nicotine patch  Urine analysis significant for pyuria, hematuria elevated leukocyte esterase, will check urine cultures, will hold on any antibiotics    DVT Prophylaxis SCDs   AM Labs Ordered, also please review Full Orders  Family Communication: Admission, patients condition and plan of care including tests being ordered have been discussed with the patient and wife at bedside who indicate understanding and agree with the plan and Code Status.  Code Status Full  Likely DC to  home  Condition GUARDED    Consults called: renal    Admission status: inpatient    Time spent in minutes : 70 minutes   Phillips Climes M.D on 09/12/2022 at 5:01 PM   [facility] - Office  [phone]

## 2022-09-12 NOTE — Consult Note (Signed)
Reason for Consult: AKI/CKD stage IV  Referring Physician: Laverta Baltimore, MD  Jeremy [name] is an 81 y.o. 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 Royce Macadamia.  He was instructed to go directly to the ED for admission and further evaluation.  The trend in Scr is seen below.    His losartan dose was increased to 50 mg on 07/24/22 and Scr rose from 2.9 to 6 on 09/03/22.  He was seen in the office on 09/11/22 and Scr continued to climb to 7.36 and was taken off of losartan and started on hydralazine.  Renal US on 09/11/22 was without evidence of hydronephrosis.  He was also noted to nephrotic range proteinuria and was referred to IR for a kidney biopsy.  Serology workup included negative ANA, ANCA, SPEP/UPEP, and normal complements.  He denies any N/V/D, dysuria, pyuria, hematuria, urgency, frequency, but does have retention.  No lower extremity edema, SOB, orthopnea, or PND.  No recent NSAID use.  No new medications other than hydralazine 50 mg bid and amlodipine 10 mg daily that was started 09/11/22 to replace losartan 50 mg daily.  Trend in Creatinine: Creatinine, Ser  Date/Time Value Ref Range Status  09/12/2022 11:35 AM 7.82 (H) 0.61 - 1.24 mg/dL Final  09/11/2022 7.36 (H) 0.76 - 1.27 mg/dL Final  09/03/2022 6.00 (H) 0.76 - 1.27 mg/dL Final  07/23/2022 2.90 (H) 0.76 - 1.27 mg/dL Final  06/13/2022 3.19 (H) 0.76 - 1.27 mg/dL Final  04/23/2022 12:00 AM 3.23 (H) 0.70 - 1.22 mg/dL Final  12/25/2021 07:59 AM 2.03 (H) 0.61 - 1.24 mg/dL Final  12/01/2019 09:37 AM 1.58 (H) 0.40 - 1.50 mg/dL Final  07/06/2019 08:53 AM 1.53 (H) 0.40 - 1.50 mg/dL Final  01/05/2019 09:08 AM 1.39 0.40 - 1.50 mg/dL Final  03/17/2018 12:53 PM 1.22 0.40 - 1.50 mg/dL Final  03/04/2018 05:28 AM 1.21  0.61 - 1.24 mg/dL Final  03/03/2018 02:27 AM 1.55 (H) 0.61 - 1.24 mg/dL Final  03/02/2018 09:13 PM 1.52 (H) 0.61 - 1.24 mg/dL Final  08/21/2017 08:33 AM 1.22 0.61 - 1.24 mg/dL Final  03/11/2017 10:13 AM 1.18 0.40 - 1.50 mg/dL Final  03/07/2016 10:15 AM 1.26 0.40 - 1.50 mg/dL Final  08/23/2015 02:43 PM 1.19 0.40 - 1.50 mg/dL Final  03/08/2015 12:18 PM 1.56 (H) 0.40 - 1.50 mg/dL Final  01/26/2014 10:10 AM 1.1 0.4 - 1.5 mg/dL Final  07/29/2013 01:08 PM 0.88 0.50 - 1.35 mg/dL Final  01/25/2013 10:05 AM 1.2 0.4 - 1.5 mg/dL Final  01/20/2012 09:59 AM 1.1 0.4 - 1.5 mg/dL Final  01/14/2011 09:38 AM 1.1 0.4 - 1.5 mg/dL Final  01/15/2010 10:10 AM 1.1 0.4 - 1.5 mg/dL Final  10/13/2009 10:33 AM 1.0 0.4 - 1.5 mg/dL Final  10/10/2008 10:53 AM 1.1 0.4 - 1.5 mg/dL Final  06/11/2007 09:29 AM 1.1 0.4 - 1.5 mg/dL Final    PMH:   Past Medical History:  Diagnosis Date   Cancer ([place])    COPD (chronic obstructive pulmonary disease) ([place])    Enlarged prostate    Hyperlipidemia    Hypertension    Self-catheterizes urinary bladder    pt. does this morning and evening--been doing this since 11/2016   Sleep apnea    not using a cpap machine    PSH:  Past Surgical History:  Procedure Laterality Date   BACK SURGERY     BLADDER TUMOR EXCISION  2015   benign   BLEPHAROPLASTY  12/2018   LUMBAR LAMINECTOMY/DECOMPRESSION MICRODISCECTOMY Bilateral 08/25/2017   Procedure: Bilateral Lumbar Three- Four, Lumbar Four- Five, Lumbar Five- Sacral One Laminectomy;  Surgeon: Kristeen Miss, MD;  Location: [facility];  Service: Neurosurgery;  Laterality: Bilateral;  Bilateral L3-4 L4-5 L5-S1 Laminectomy   SKIN CANCER EXCISION     Surg x2...   TONSILLECTOMY AND ADENOIDECTOMY     Surg as a child...   TRANSURETHRAL RESECTION OF BLADDER TUMOR N/A 01/03/2022   Procedure: TRANSURETHRAL RESECTION OF BLADDER TUMOR (TURBT)/ CYSTOSCOPY/ EXAM UNDER ANESTHESIA, BILATERAL RETROGRADE/ BLADDER BIOPSIES;  Surgeon: Raynelle Bring, MD;   Location: [facility];  Service: Urology;  Laterality: N/A;  GENERAL ANESTHESIA WITH PARALYSIS    Allergies:  Allergies  Allergen Reactions   Sulfa Antibiotics     Rash, light headed, shaking   Penicillins Other (See Comments)    hives severe as a child     Medications:   Prior to Admission medications   Medication Sig Start Date End Date Taking? Authorizing Provider  acetaminophen (TYLENOL) 500 MG tablet Take 1,000 mg by mouth every 6 (six) hours as needed for moderate pain.   Yes [provider]  amLODipine (NORVASC) 10 MG tablet TAKE 1 TABLET EVERY DAY Patient taking differently: Take 10 mg by mouth every evening. 03/06/21  Yes [name], DO  Camphor-Eucalyptus-Menthol (VICKS VAPORUB EX) Apply 1 application topically at bedtime as needed (congestion).   Yes [provider]  cholecalciferol (VITAMIN D3) 25 MCG (1000 UNIT) tablet Take 1,000 Units by mouth daily.   Yes [provider]  diphenhydrAMINE-zinc acetate (BENADRYL) cream Apply 1 Application topically daily as needed for itching.   Yes [provider]  guaiFENesin (MUCINEX) 600 MG 12 hr tablet Take 1,200 mg by mouth 2 (two) times daily as needed for cough.   Yes [provider]  hydrALAZINE (APRESOLINE) 50 MG tablet Take 50 mg by mouth 2 (two) times daily. 09/11/22  Yes [provider]  METAMUCIL FIBER PO Take 2 capsules by mouth every evening.   Yes [provider]  metoprolol succinate (TOPROL-XL) 50 MG 24 hr tablet TAKE 1 TABLET DAILY WITH OR IMMEDIATELY FOLLOWING A MEAL Patient taking differently: Take 50 mg by mouth every evening. 10/15/21  Yes [name], DO  olopatadine (PATANOL) 0.1 % ophthalmic solution Place 1 drop into both eyes daily as needed for dry eyes. 10/09/21  Yes [provider]  simvastatin (ZOCOR) 40 MG tablet TAKE 1 TABLET EVERY DAY Patient taking differently: Take 40 mg by mouth daily. 06/28/22  Yes [name], DO  tamsulosin  (FLOMAX) 0.4 MG CAPS Take 0.8 mg by mouth at bedtime.   Yes [provider]  cephALEXin (KEFLEX) 500 MG capsule Take 1 capsule (500 mg total) by mouth 4 (four) times daily. Patient not taking: Reported on 09/12/2022 07/04/22   Fredia Sorrow, MD  losartan (COZAAR) 100 MG tablet Take 1 tablet (100 mg total) by mouth every evening. Patient not taking: Reported on 09/12/2022 04/11/22   [name], DO  methylPREDNISolone (MEDROL DOSEPAK) 4 MG TBPK tablet Taper as directed on packaging. Patient not taking: Reported on 09/12/2022 04/23/22   [name], DO    Inpatient medications:  nicotine  21 mg Transdermal Once    Discontinued Meds:   Medications Discontinued During This Encounter  Medication Reason   cloNIDine (CATAPRES) 0.1 MG  tablet Discontinued by provider   triamcinolone cream (KENALOG) 0.1 % Patient Preference    Social History:  reports that he has been smoking cigarettes. He started smoking about 60 years ago. He has a 25.00 pack-year smoking history. He has never used smokeless tobacco. He reports current alcohol use. He reports that he does not use drugs.  Family History:   Family History  Problem Relation Age of Onset   Colon cancer Neg Hx    Stomach cancer Neg Hx    Rectal cancer Neg Hx     A comprehensive review of systems was negative. Weight change:   Intake/Output Summary (Last 24 hours) at 09/12/2022 1617 Last data filed at 09/12/2022 1533 Gross per 24 hour  Intake --  Output 450 ml  Net -450 ml   BP (!) 159/66   Pulse 72   Temp 97.6 F (36.4 C) (Oral)   Resp 18   Ht 5' 10.5" (1.791 m)   Wt 73.9 kg   SpO2 90%   BMI 23.06 kg/m  Vitals:   09/12/22 1430 09/12/22 1500 09/12/22 1521 09/12/22 1530  BP: (!) 153/65 (!) 161/65  (!) 159/66  Pulse: 74 77  72  Resp: '11 19  18  '$ Temp:   97.6 F (36.4 C)   TempSrc:   Oral   SpO2: 93% 90%  90%  Weight:      Height:         General appearance: alert, cooperative, and no distress Head:  Normocephalic, without obvious abnormality, atraumatic Resp: clear to auscultation bilaterally Cardio: regular rate and rhythm, S1, S2 normal, no murmur, click, rub or gallop GI: soft, non-tender; bowel sounds normal; no masses,  no organomegaly Extremities: edema trace pretibial edema bilaterally  Labs: Basic Metabolic Panel: Recent Labs  Lab 09/12/22 1135  NA 139  K 5.5*  CL 110  CO2 15*  GLUCOSE 122*  BUN 97*  CREATININE 7.82*  CALCIUM 8.2*   Liver Function Tests: No results for input(s): "AST", "ALT", "ALKPHOS", "BILITOT", "PROT", "ALBUMIN" in the last 168 hours. No results for input(s): "LIPASE", "AMYLASE" in the last 168 hours. No results for input(s): "AMMONIA" in the last 168 hours. CBC: Recent Labs  Lab 09/12/22 1135  WBC 6.3  NEUTROABS 4.4  HGB 9.4*  HCT 30.2*  MCV 97.1  PLT 249   PT/INR: '@LABRCNTIP'$ (inr:5) Cardiac Enzymes: )No results for input(s): "CKTOTAL", "CKMB", "CKMBINDEX", "TROPONINI" in the last 168 hours. CBG: No results for input(s): "GLUCAP" in the last 168 hours.  Iron Studies: No results for input(s): "IRON", "TIBC", "TRANSFERRIN", "FERRITIN" in the last 168 hours.  Xrays/Other Studies: US RENAL  Result Date: 09/11/2022 CLINICAL DATA:  Acute on chronic kidney disease. EXAM: RENAL / URINARY TRACT ULTRASOUND COMPLETE COMPARISON:  May 02, 2022. FINDINGS: Right Kidney: Renal measurements: 10.5 x 4.7 x 3.9 cm = volume: 99 mL. Two simple cysts are noted, the largest measuring 2.5 cm in upper pole. Increased echogenicity of renal parenchyma is noted. No mass or hydronephrosis visualized. Left Kidney: Renal measurements: 12.1 x 6.2 x 5.8 cm = volume: 228 mL. 1 cm simple cyst is seen in upper pole. Increased echogenicity of renal parenchyma is noted. No mass or hydronephrosis visualized. Bladder: Appears normal for degree of bladder distention. Left ureteral jet is noted. Other: None. IMPRESSION: Increased echogenicity of renal parenchyma is noted  bilaterally suggesting medical renal disease. No hydronephrosis or renal obstruction is noted. Electronically Signed   By: Marijo Conception M.D.   On: 09/11/2022 16:54  Assessment/Plan:  AKI/CKD stage IV - rapid decline in renal function after increasing losartan dose to 50 mg on 07/24/22.  Serology workup negative, renal US without hydronephrosis.  Will order renal artery duplex to r/o RAS (given long tobacco history).  Gentle IVF's overnight and follow UOP and renal function.  He has no uremic symptoms.  No urgent indication for dialysis, however we did discuss the possible need for RRT in the near future if he does not improve off of ARB.  Avoid nephrotoxic medications including NSAIDs and iodinated intravenous contrast exposure unless the latter is absolutely indicated.   Preferred narcotic agents for pain control are hydromorphone, fentanyl, and methadone. Morphine should not be used.  Avoid Baclofen and avoid oral sodium phosphate and magnesium citrate based laxatives / bowel preps.  Continue strict Input and Output monitoring. Will monitor the patient closely with you and intervene or adjust therapy as indicated by changes in clinical status/labs  Proteinuria - UPC 6862 mg/g on 06/14/22.  Will order 24 hour urine for protein.  Had negative SPEP and UPEP.  May benefit from kidney biopsy during hospitalization if he truly has nephrotic syndrome. HTN - new home regimen amlodipine 10 mg daily, hydralazine 50 mg bid, metoprolol ER 50 mg daily.  Continue to hold losartan for now.  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 Anemia of CKD stage IV - Hgb has dropped from 12.2 on 07/23/22 to 9.4 today.  Will check iron stores and guaiac stool.  Will likely require ESA initiation. OSA - does not use CPAP   Donetta Potts 09/12/2022, 4:17 PM

## 2022-09-12 NOTE — ED Provider Notes (Signed)
[facility] EMERGENCY DEPARTMENT Provider Note   CSN: [id number] Arrival date & time: 09/12/22  1107     History  Chief Complaint  Patient presents with   abnormal labs    Jeremy [name] is a 81 y.o. male.  HPI  Went to nephrologist yesterday for checkup who noted that patient's labs havent been looking good and patient would need renal biopsy. They also sent patient to imaging center yesterday and had renal US, the scans didn't look good, but didn't show obstruction. Also had blood work done, and his nephrologist called this morning to say that his labs looked worse, and he needed to go to the hospital. Has a hx of bladder issues/retention and he self catheterizes 3 times a day. Patient had a physical this year that showed kidney injury and he started seeing a nephrologist. Patient follows with [facility].  Denies any constitutional symptoms, and is feeling like his normal state of health. Has had a hx of 2 bladder infections and leg cellulitis this year. With hx of bladder cancer dx 1999, w/ last resection in 2006  Usually cath's 3 x a day (am, afternoon, evening), 2/2 retention for the last 4 years.     Home Medications Prior to Admission medications   Medication Sig Start Date End Date Taking? Authorizing Provider  acetaminophen (TYLENOL) 500 MG tablet Take 1,000 mg by mouth every 6 (six) hours as needed for moderate pain.   Yes [provider]  amLODipine (NORVASC) 10 MG tablet TAKE 1 TABLET EVERY DAY Patient taking differently: Take 10 mg by mouth every evening. 03/06/21  Yes [name], DO  Camphor-Eucalyptus-Menthol (VICKS VAPORUB EX) Apply 1 application topically at bedtime as needed (congestion).   Yes [provider]  cholecalciferol (VITAMIN D3) 25 MCG (1000 UNIT) tablet Take 1,000 Units by mouth daily.   Yes [provider]  diphenhydrAMINE-zinc acetate (BENADRYL) cream Apply 1 Application topically daily as needed for  itching.   Yes [provider]  guaiFENesin (MUCINEX) 600 MG 12 hr tablet Take 1,200 mg by mouth 2 (two) times daily as needed for cough.   Yes [provider]  hydrALAZINE (APRESOLINE) 50 MG tablet Take 50 mg by mouth 2 (two) times daily. 09/11/22  Yes [provider]  METAMUCIL FIBER PO Take 2 capsules by mouth every evening.   Yes [provider]  metoprolol succinate (TOPROL-XL) 50 MG 24 hr tablet TAKE 1 TABLET DAILY WITH OR IMMEDIATELY FOLLOWING A MEAL Patient taking differently: Take 50 mg by mouth every evening. 10/15/21  Yes [name], DO  olopatadine (PATANOL) 0.1 % ophthalmic solution Place 1 drop into both eyes daily as needed for dry eyes. 10/09/21  Yes [provider]  simvastatin (ZOCOR) 40 MG tablet TAKE 1 TABLET EVERY DAY Patient taking differently: Take 40 mg by mouth daily. 06/28/22  Yes [name], DO  tamsulosin (FLOMAX) 0.4 MG CAPS Take 0.8 mg by mouth at bedtime.   Yes [provider]  cephALEXin (KEFLEX) 500 MG capsule Take 1 capsule (500 mg total) by mouth 4 (four) times daily. Patient not taking: Reported on 09/12/2022 07/04/22   Fredia Sorrow, MD  losartan (COZAAR) 100 MG tablet Take 1 tablet (100 mg total) by mouth every evening. Patient not taking: Reported on 09/12/2022 04/11/22   [name], DO  methylPREDNISolone (MEDROL DOSEPAK) 4 MG TBPK tablet Taper as directed on packaging. Patient not taking: Reported on 09/12/2022 04/23/22   [name], DO  Allergies    Sulfa antibiotics and Penicillins    Review of Systems   Review of Systems  Gastrointestinal:  Negative for abdominal pain.  Genitourinary:  Negative for dysuria and flank pain.  Musculoskeletal:  Negative for myalgias.    Physical Exam Updated Vital Signs BP (!) 159/66   Pulse 72   Temp 97.6 F (36.4 C) (Oral)   Resp 18   Ht 5' 10.5" (1.791 m)   Wt 73.9 kg   SpO2 90%   BMI 23.06 kg/m  Physical Exam Constitutional:       Appearance: Normal appearance.  Cardiovascular:     Rate and Rhythm: Normal rate and regular rhythm.     Pulses: Normal pulses.     Heart sounds: Normal heart sounds. No murmur heard.    No friction rub. No gallop.  Pulmonary:     Effort: Pulmonary effort is normal. No respiratory distress.     Breath sounds: Normal breath sounds. No stridor. No wheezing, rhonchi or rales.  Abdominal:     General: Abdomen is flat. Bowel sounds are normal. There is no distension.     Palpations: Abdomen is soft. There is no mass.     Tenderness: There is no abdominal tenderness. There is no right CVA tenderness, left CVA tenderness, guarding or rebound.     Hernia: No hernia is present.  Skin:    Capillary Refill: Capillary refill takes less than 2 seconds.  Neurological:     Mental Status: He is alert.     ED Results / Procedures / Treatments   Labs (all labs ordered are listed, but only abnormal results are displayed) Labs Reviewed  CBC WITH DIFFERENTIAL/PLATELET - Abnormal; Notable for the following components:      Result Value   RBC 3.11 (*)    Hemoglobin 9.4 (*)    HCT 30.2 (*)    RDW 15.6 (*)    Abs Immature Granulocytes 0.12 (*)    All other components within normal limits  BASIC METABOLIC PANEL - Abnormal; Notable for the following components:   Potassium 5.5 (*)    CO2 15 (*)    Glucose, Bld 122 (*)    BUN 97 (*)    Creatinine, Ser 7.82 (*)    Calcium 8.2 (*)    GFR, Estimated 6 (*)    All other components within normal limits  URINALYSIS, ROUTINE W REFLEX MICROSCOPIC - Abnormal; Notable for the following components:   APPearance CLOUDY (*)    Hgb urine dipstick MODERATE (*)    Protein, ur 100 (*)    Leukocytes,Ua LARGE (*)    RBC / HPF >50 (*)    WBC, UA >50 (*)    Bacteria, UA MANY (*)    All other components within normal limits    EKG None  Radiology US RENAL  Result Date: 09/11/2022 CLINICAL DATA:  Acute on chronic kidney disease. EXAM: RENAL / URINARY TRACT  ULTRASOUND COMPLETE COMPARISON:  May 02, 2022. FINDINGS: Right Kidney: Renal measurements: 10.5 x 4.7 x 3.9 cm = volume: 99 mL. Two simple cysts are noted, the largest measuring 2.5 cm in upper pole. Increased echogenicity of renal parenchyma is noted. No mass or hydronephrosis visualized. Left Kidney: Renal measurements: 12.1 x 6.2 x 5.8 cm = volume: 228 mL. 1 cm simple cyst is seen in upper pole. Increased echogenicity of renal parenchyma is noted. No mass or hydronephrosis visualized. Bladder: Appears normal for degree of bladder distention. Left ureteral jet is noted. Other:  None. IMPRESSION: Increased echogenicity of renal parenchyma is noted bilaterally suggesting medical renal disease. No hydronephrosis or renal obstruction is noted. Electronically Signed   By: Marijo Conception M.D.   On: 09/11/2022 16:54    Procedures Procedures    Medications Ordered in ED Medications  nicotine (NICODERM CQ - dosed in mg/24 hours) patch 21 mg (21 mg Transdermal Patch Applied 09/12/22 1519)  sodium zirconium cyclosilicate (LOKELMA) packet 10 g (10 g Oral Given 09/12/22 1542)    ED Course/ Medical Decision Making/ A&P                           Medical Decision Making This patient presents to the ED for concerning renal labs, with hx of CKD and worsening renal function. DDX for this could be intrinsic renal disease, ATN, or AKI  Additional history obtained: from Wife Most recent Family Medicine note reviewed , renal Hx, Renal US  Lab Tests: I Ordered, and personally interpreted labs.  The pertinent results include:  K 5.5 and a Cr. 7.82, UA positive for leukocytes but negative for nitrites. Creatinine likely elevated in setting or worsening renal function.     Medicines ordered and prescription drug management:  I ordered medication including Medications nicotine (NICODERM CQ - dosed in mg/24 hours) patch 21 mg (21 mg Transdermal Patch Applied 09/12/22 1519) sodium zirconium cyclosilicate (LOKELMA)  packet 10 g (10 g Oral Given 09/12/22 1542) for elevated potassium  Reevaluation of the patient after these medicines showed that the patient improved I have reviewed the patients home medicines and have made adjustments as needed    Consultations Obtained: I requested a consultation with Nephrology and discussed the lab and imaging findings.    Problem List / ED Course:       (N17.9) AKI (acute kidney injury) ([place])  (primary encounter diagnosis)      Social Determinants of Health:  Patient is 81 yo with complicated bladder hx   Dispostion:  After consideration of the diagnostic results and the patients response to treatment, I feel that the patent would benefit from admission.    Amount and/or Complexity of Data Reviewed Labs: ordered.  Risk OTC drugs. Prescription drug management. Decision regarding hospitalization.           Final Clinical Impression(s) / ED Diagnoses Final diagnoses:  AKI (acute kidney injury) [facility])    Rx / [place] Orders ED Discharge Orders     None         Holley Bouche, MD 09/12/22 1647    Margette Fast, MD 09/16/22 [id number]

## 2022-09-13 ENCOUNTER — Inpatient Hospital Stay (HOSPITAL_COMMUNITY): Payer: Medicare Other

## 2022-09-13 ENCOUNTER — Encounter (HOSPITAL_COMMUNITY): Payer: Self-pay | Admitting: Internal Medicine

## 2022-09-13 DIAGNOSIS — N138 Other obstructive and reflux uropathy: Secondary | ICD-10-CM | POA: Diagnosis not present

## 2022-09-13 DIAGNOSIS — N401 Enlarged prostate with lower urinary tract symptoms: Secondary | ICD-10-CM | POA: Diagnosis not present

## 2022-09-13 DIAGNOSIS — N179 Acute kidney failure, unspecified: Secondary | ICD-10-CM | POA: Diagnosis not present

## 2022-09-13 DIAGNOSIS — I10 Essential (primary) hypertension: Secondary | ICD-10-CM | POA: Diagnosis not present

## 2022-09-13 LAB — RENAL FUNCTION PANEL
Albumin: 2.7 g/dL — ABNORMAL LOW (ref 3.5–5.0)
Anion gap: 15 (ref 5–15)
BUN: 96 mg/dL — ABNORMAL HIGH (ref 8–23)
CO2: 17 mmol/L — ABNORMAL LOW (ref 22–32)
Calcium: 7.9 mg/dL — ABNORMAL LOW (ref 8.9–10.3)
Chloride: 110 mmol/L (ref 98–111)
Creatinine, Ser: 7.85 mg/dL — ABNORMAL HIGH (ref 0.61–1.24)
GFR, Estimated: 6 mL/min — ABNORMAL LOW (ref >60)
Glucose, Bld: 130 mg/dL — ABNORMAL HIGH (ref 70–99)
Phosphorus: 8.5 mg/dL — ABNORMAL HIGH (ref 2.5–4.6)
Potassium: 4.8 mmol/L (ref 3.5–5.1)
Sodium: 142 mmol/L (ref 135–145)

## 2022-09-13 LAB — CBC WITH DIFFERENTIAL/PLATELET
Abs Immature Granulocytes: 0.06 10*3/uL (ref 0.00–0.07)
Basophils Absolute: 0 10*3/uL (ref 0.0–0.1)
Basophils Relative: 1 %
Eosinophils Absolute: 0.1 10*3/uL (ref 0.0–0.5)
Eosinophils Relative: 2 %
HCT: 26.8 % — ABNORMAL LOW (ref 39.0–52.0)
Hemoglobin: 8.7 g/dL — ABNORMAL LOW (ref 13.0–17.0)
Immature Granulocytes: 1 %
Lymphocytes Relative: 13 %
Lymphs Abs: 0.9 10*3/uL (ref 0.7–4.0)
MCH: 30.6 pg (ref 26.0–34.0)
MCHC: 32.5 g/dL (ref 30.0–36.0)
MCV: 94.4 fL (ref 80.0–100.0)
Monocytes Absolute: 0.7 10*3/uL (ref 0.1–1.0)
Monocytes Relative: 10 %
Neutro Abs: 4.9 10*3/uL (ref 1.7–7.7)
Neutrophils Relative %: 73 %
Platelets: 224 10*3/uL (ref 150–400)
RBC: 2.84 MIL/uL — ABNORMAL LOW (ref 4.22–5.81)
RDW: 15.3 % (ref 11.5–15.5)
WBC: 6.7 10*3/uL (ref 4.0–10.5)
nRBC: 0 % (ref 0.0–0.2)

## 2022-09-13 LAB — IRON AND TIBC
Iron: 69 ug/dL (ref 45–182)
Saturation Ratios: 37 % (ref 17.9–39.5)
TIBC: 189 ug/dL — ABNORMAL LOW (ref 250–450)
UIBC: 120 ug/dL

## 2022-09-13 MED ORDER — SODIUM CHLORIDE 0.9 % IV SOLN
1.0000 g | INTRAVENOUS | Status: DC
  Administered 2022-09-13 – 2022-09-15 (×3): 1 g via INTRAVENOUS
  Filled 2022-09-13 (×3): qty 10

## 2022-09-13 MED ORDER — HEPARIN SODIUM (PORCINE) 5000 UNIT/ML IJ SOLN
5000.0000 [IU] | Freq: Three times a day (TID) | INTRAMUSCULAR | Status: DC
  Administered 2022-09-13 – 2022-09-15 (×7): 5000 [IU] via SUBCUTANEOUS
  Filled 2022-09-13 (×8): qty 1

## 2022-09-13 MED ORDER — ATORVASTATIN CALCIUM 10 MG PO TABS
20.0000 mg | ORAL_TABLET | Freq: Every day | ORAL | Status: DC
  Administered 2022-09-14 – 2022-09-17 (×4): 20 mg via ORAL
  Filled 2022-09-13 (×4): qty 2

## 2022-09-13 NOTE — Progress Notes (Signed)
PROGRESS NOTE    Jeremy [name]  TLX:[id number] DOB: 1941-08-06 DOA: 09/12/2022 PCP: [name], DO   Chief Complaint  Patient presents with   abnormal labs    Brief Narrative:   Jeremy [name]  is a 81 y.o. male, with medical history significant of bladder cancer (s/p of excision ), self catheterization, BPH, OSA not on CPAP, hypertension, hyperlipidemia, CKD-4, who was sent by his nephrologist for evaluation of worsening renal function, and is following with Dr. Royce Macadamia, it was noted with increased serum creatinine over the last few weeks, patient on losartan for blood pressure, dose was increased on 07/24/2022, creatinine has been increased to trending up from baseline of 2.9, to 6 on 08/30/2022, he was seen again on 09/11/2022, for which his creatinine went up to 7.36, he was taken off losartan then, and started on hydralazine instead, renal ultrasound 09/11/2022 without evidence of hydronephrosis, patient was sent to ED for further evaluation given his worsening renal function, patient reports he self catheter times a day, he denies any fever, chills or any new complaints, he denies any new NSAIDs use, hematuria, pyuria, or worsening lower extremity edema, no dyspnea or orthopnea. -In ED blood work significant for creatinine of 7.8, potassium of 5.5, EKG still pending, pressure elevated 170/64, [facility] hospitalist consulted to admit.    Assessment & Plan:   Principal Problem:   AKI (acute kidney injury) ([place]) Active Problems:   History of bladder cancer   Essential hypertension   Chronic bronchitis (HCC)   Benign prostatic hyperplasia with urinary obstruction   OSA (obstructive sleep apnea)   Cigarette smoker   Hyperlipidemia      AKI on CKD stage IV -Baseline creatinine 2.9, with rapid increase over the last few weeks, 1.8 on admission. -Biopsy on Monday. -Continue with IV bicarb. -vascular Doppler tomorrow. -Avoid nephrotoxic medications. -Evidence of proteinuria in the past,  going collection for 24 hours urine for protein     Hyperkalemia -received Lokelma, improved this a.m. at 4.8.   Hypertension Blood pressure is elevated, continue with home regimen including Norvasc, hydralazine and metoprolol,  -To hold losartan - will add prn hydralazine    History of BPH/urinary retention -Patient does self cath at home, inserted t Foley catheter during hospital stay, continue with Flomax   OSA -Not use CPAP at home   Anemia of chronic kidney disease -Renal function at baseline -Iron panel per renal   Hyperlipidemia -Continue with statin   Chronic bronchitis -We will keep on as needed albuterol given some mild wheezing   Tobacco abuse - Continue with nicotine patch   Urine analysis significant for pyuria, hematuria elevated leukocyte esterase, culture growing gram-negative rods, will start Rocephin.       DVT prophylaxis: heparin Code Status: Full Family Communication: wife at bedside Disposition:   Status is: Inpatient    Consultants:  renal   Subjective:  Significant events overnight, he denies any complaints, patient is eager to go home today, stating nothing has been done for me here and I want to go home  Objective: Vitals:   09/12/22 2012 09/13/22 0420 09/13/22 0854 09/13/22 1634  BP: (!) 162/67 (!) 124/55 (!) 137/59 (!) 155/54  Pulse: 73 75 77 75  Resp: 18 (!) '21 20 18  '$ Temp: 97.6 F (36.4 C) 97.8 F (36.6 C) 98.1 F (36.7 C) (!) 97.5 F (36.4 C)  TempSrc: Oral Oral Oral Oral  SpO2: 91% 95% 92% 92%  Weight: 75.6 kg     Height: 5'  10" (1.778 m)       Intake/Output Summary (Last 24 hours) at 09/13/2022 1645 Last data filed at 09/13/2022 1600 Gross per 24 hour  Intake 1502.65 ml  Output 1400 ml  Net 102.65 ml   Filed Weights   09/12/22 1125 09/12/22 2012  Weight: 73.9 kg 75.6 kg    Examination:  Awake Alert, Oriented X 3, No new F.N deficits, Normal affect Symmetrical Chest wall movement, Good air movement  bilaterally, CTAB RRR,No Gallops,Rubs or new Murmurs, No Parasternal Heave +ve B.Sounds, Abd Soft, No tenderness, No rebound - guarding or rigidity. No Cyanosis, Clubbing or edema, No new Rash or bruise      Data Reviewed: I have personally reviewed following labs and imaging studies  CBC: Recent Labs  Lab 09/12/22 1135 09/13/22 0214  WBC 6.3 6.7  NEUTROABS 4.4 4.9  HGB 9.4* 8.7*  HCT 30.2* 26.8*  MCV 97.1 94.4  PLT 249 300    Basic Metabolic Panel: Recent Labs  Lab 09/12/22 1135 09/13/22 0214  NA 139 142  K 5.5* 4.8  CL 110 110  CO2 15* 17*  GLUCOSE 122* 130*  BUN 97* 96*  CREATININE 7.82* 7.85*  CALCIUM 8.2* 7.9*  PHOS  --  8.5*    GFR: Estimated Creatinine Clearance: 7.5 mL/min (A) (by C-G formula based on SCr of 7.85 mg/dL (H)).  Liver Function Tests: Recent Labs  Lab 09/13/22 0214  ALBUMIN 2.7*    CBG: No results for input(s): "GLUCAP" in the last 168 hours.   Recent Results (from the past 240 hour(s))  Urine Culture     Status: Abnormal (Preliminary result)   Collection Time: 09/12/22  5:23 PM   Specimen: Urine, Clean Catch  Result Value Ref Range Status   Specimen Description URINE, CLEAN CATCH  Final   Special Requests NONE  Final   Culture (A)  Final    >=100,000 COLONIES/mL GRAM NEGATIVE RODS CULTURE REINCUBATED FOR BETTER GROWTH Performed at [facility], [address], [place], [place] [address]    Report Status PENDING  Incomplete         Radiology Studies: US RENAL  Result Date: 09/11/2022 CLINICAL DATA:  Acute on chronic kidney disease. EXAM: RENAL / URINARY TRACT ULTRASOUND COMPLETE COMPARISON:  May 02, 2022. FINDINGS: Right Kidney: Renal measurements: 10.5 x 4.7 x 3.9 cm = volume: 99 mL. Two simple cysts are noted, the largest measuring 2.5 cm in upper pole. Increased echogenicity of renal parenchyma is noted. No mass or hydronephrosis visualized. Left Kidney: Renal measurements: 12.1 x 6.2 x 5.8 cm = volume: 228 mL. 1  cm simple cyst is seen in upper pole. Increased echogenicity of renal parenchyma is noted. No mass or hydronephrosis visualized. Bladder: Appears normal for degree of bladder distention. Left ureteral jet is noted. Other: None. IMPRESSION: Increased echogenicity of renal parenchyma is noted bilaterally suggesting medical renal disease. No hydronephrosis or renal obstruction is noted. Electronically Signed   By: Marijo Conception M.D.   On: 09/11/2022 16:54        Scheduled Meds:  albuterol  2.5 mg Nebulization QID   amLODipine  10 mg Oral QPM   [START ON 09/14/2022] atorvastatin  20 mg Oral Daily   cholecalciferol  1,000 Units Oral Daily   hydrALAZINE  50 mg Oral BID   melatonin  5 mg Oral QHS   metoprolol succinate  50 mg Oral QPM   tamsulosin  0.8 mg Oral QHS   Continuous Infusions:  sodium  bicarbonate 150 mEq in dextrose 5 % 1,150 mL infusion 75 mL/hr at 09/13/22 1229     LOS: 1 day       Phillips Climes, MD [facility]   To contact the attending provider between 7A-7P or the covering provider during after hours 7P-7A, please log into the web site [email or web address] and access using universal [place] password for that web site. If you do not have the password, please call the hospital operator.  09/13/2022, 4:45 PM

## 2022-09-13 NOTE — Progress Notes (Signed)
[facility] Progress Note   Assessment/ Plan:    AKI/CKD stage IV - rapid decline in renal function after increasing losartan dose to 50 mg on 07/24/22.  Serology workup negative, renal US without hydronephrosis.  Will order renal artery duplex to r/o RAS (given long tobacco history).  Gentle IVF's overnight and follow UOP and renal function.  He has no uremic symptoms.  No urgent indication for dialysis, however we did discuss the possible need for RRT in the near future if he does not improve off of ARB.  Avoid nephrotoxic medications including NSAIDs and iodinated intravenous contrast exposure unless the latter is absolutely indicated.   Preferred narcotic agents for pain control are hydromorphone, fentanyl, and methadone. Morphine should not be used.  Avoid Baclofen and avoid oral sodium phosphate and magnesium citrate based laxatives / bowel preps.  Continue strict Input and Output monitoring. Will monitor the patient closely with you and intervene or adjust therapy as indicated by changes in clinical status/labs  Pt is unwilling to stay for any other workup/ monitoring despite the discussion of the risks.  He wishes to leave.  This will be AMA.  He will need to follow up with his regular provider in clinic.   Proteinuria - UPC 6862 mg/g on 06/14/22.  Will order 24 hour urine for protein.  Had negative SPEP and UPEP.  May benefit from kidney biopsy during hospitalization if he truly has nephrotic syndrome. HTN - new home regimen amlodipine 10 mg daily, hydralazine 50 mg bid, metoprolol ER 50 mg daily.  Continue to hold losartan for now.  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 Anemia of CKD stage IV - Hgb has dropped from 12.2 on 07/23/22 to 9.4 today.  Will check iron stores and guaiac stool.  Will likely require ESA initiation. OSA - does not use CPAP  Subjective:    Seen in  room.  Upset and yelling today about not getting the biopsy.  Wants to go home.  Discussed the risks of d/c with eGFR so low- hyperkalemia, acidemia, uremia, becoming obtunded, or worse.  Wants to go home.  This will be against medical advice.     Objective:   BP (!) 137/59 (BP Location: Left Arm)   Pulse 77   Temp 98.1 F (36.7 C) (Oral)   Resp 20   Ht _0  (1.778 m)   Wt 75.6 kg   SpO2 92%   BMI 23.91 kg/m   Intake/Output Summary (Last 24 hours) at 09/13/2022 1407 Last data filed at 09/13/2022 0423 Gross per 24 hour  Intake 744.97 ml  Output 1350 ml  Net -605.03 ml   Weight change:   Physical Exam: DGL:OVFIEPP in bed Resp:unlabored WOB Abd: + Foley in place Ext: does not appear to be swelling  Imaging: US RENAL  Result Date: 09/11/2022 CLINICAL DATA:  Acute on chronic kidney disease. EXAM: RENAL / URINARY TRACT ULTRASOUND COMPLETE COMPARISON:  May 02, 2022. FINDINGS: Right Kidney: Renal measurements: 10.5 x 4.7 x 3.9 cm = volume: 99 mL. Two simple cysts are noted, the largest measuring 2.5 cm in upper pole. Increased echogenicity of renal parenchyma is noted. No mass or hydronephrosis visualized. Left Kidney: Renal measurements: 12.1 x 6.2 x 5.8 cm = volume: 228 mL. 1 cm simple cyst is seen in upper pole. Increased echogenicity of renal parenchyma is noted. No mass or hydronephrosis visualized. Bladder: Appears normal for  degree of bladder distention. Left ureteral jet is noted. Other: None. IMPRESSION: Increased echogenicity of renal parenchyma is noted bilaterally suggesting medical renal disease. No hydronephrosis or renal obstruction is noted. Electronically Signed   By: Marijo Conception M.D.   On: 09/11/2022 16:54    Labs: BMET Recent Labs  Lab 09/12/22 1135 09/13/22 0214  NA 139 142  K 5.5* 4.8  CL 110 110  CO2 15* 17*  GLUCOSE 122* 130*  BUN 97* 96*  CREATININE 7.82* 7.85*  CALCIUM 8.2* 7.9*  PHOS  --  8.5*   CBC Recent Labs  Lab 09/12/22 1135  09/13/22 0214  WBC 6.3 6.7  NEUTROABS 4.4 4.9  HGB 9.4* 8.7*  HCT 30.2* 26.8*  MCV 97.1 94.4  PLT 249 224    Medications:     albuterol  2.5 mg Nebulization QID   amLODipine  10 mg Oral QPM   cholecalciferol  1,000 Units Oral Daily   hydrALAZINE  50 mg Oral BID   melatonin  5 mg Oral QHS   metoprolol succinate  50 mg Oral QPM   nicotine  21 mg Transdermal Once   simvastatin  40 mg Oral Daily   tamsulosin  0.8 mg Oral QHS    Madelon Lips MD 09/13/2022, 2:07 PM

## 2022-09-13 NOTE — Evaluation (Signed)
Physical Therapy Evaluation & Discharge Patient Details Name: NIKAN ELLINGSON MRN: [id number] DOB: 1941-05-05 Today's Date: 09/13/2022  History of Present Illness  81 y/o male presented to ED on 09/12/22 for abnormal kidney labs and worsening kidney function. Admitted for AKI on CKD stage IV. PMH: CKD stage IV, HTN, OSA, hx of BPH, chronic bronchitis  Clinical Impression  Patient admitted with the above. PTA, patient lives with wife and was independent. Patient currently functioning at supervision level for mobility while pushing IV pole. Seems to be at baseline for mobility. Wanting to discharge home as soon as possible. No further skilled PT needs identified acutely. No PT follow up recommended at this time. PT will sign off. Will defer further mobility to mobility specialists and nursing staff.        Recommendations for follow up therapy are one component of a multi-disciplinary discharge planning process, led by the attending physician.  Recommendations may be updated based on patient status, additional functional criteria and insurance authorization.  Follow Up Recommendations No PT follow up      Assistance Recommended at Discharge PRN  Patient can return home with the following       Equipment Recommendations None recommended by PT  Recommendations for Other Services       Functional Status Assessment Patient has had a recent decline in their functional status and demonstrates the ability to make significant improvements in function in a reasonable and predictable amount of time.     Precautions / Restrictions Precautions Precautions: Fall Restrictions Weight Bearing Restrictions: No      Mobility  Bed Mobility Overal bed mobility: Modified Independent                  Transfers Overall transfer level: Needs assistance Equipment used: None Transfers: Sit to/from Stand Sit to Stand: Supervision           General transfer comment: supervision for safety     Ambulation/Gait Ambulation/Gait assistance: Supervision Gait Distance (Feet): 250 Feet Assistive device: IV Pole Gait Pattern/deviations: Step-through pattern, Decreased stride length Gait velocity: decreased     General Gait Details: supervision for safety. No LOB  Stairs            Wheelchair Mobility    Modified Rankin (Stroke Patients Only)       Balance Overall balance assessment: Mild deficits observed, not formally tested                                           Pertinent Vitals/Pain Pain Assessment Pain Assessment: No/denies pain    Home Living Family/patient expects to be discharged to:: Private residence Living Arrangements: Spouse/significant other Available Help at Discharge: Family Type of Home: House Home Access: Stairs to enter   Technical brewer of Steps: 2   Home Layout: One level Home Equipment: None      Prior Function Prior Level of Function : Independent/Modified Independent;Driving             Mobility Comments: plays golf       Hand Dominance        Extremity/Trunk Assessment   Upper Extremity Assessment Upper Extremity Assessment: Overall WFL for tasks assessed    Lower Extremity Assessment Lower Extremity Assessment: Generalized weakness    Cervical / Trunk Assessment Cervical / Trunk Assessment: Kyphotic  Communication   Communication: No difficulties  Cognition Arousal/Alertness: Awake/alert  Behavior During Therapy: WFL for tasks assessed/performed Overall Cognitive Status: Within Functional Limits for tasks assessed                                 General Comments: poor safety awareness and insight into current medical status and asking to leave AMA        General Comments      Exercises     Assessment/Plan    PT Assessment Patient does not need any further PT services  PT Problem List         PT Treatment Interventions      PT Goals (Current goals  can be found in the Care Plan section)  Acute Rehab PT Goals Patient Stated Goal: to go home now PT Goal Formulation: All assessment and education complete, DC therapy    Frequency       Co-evaluation               AM-PAC PT "6 Clicks" Mobility  Outcome Measure Help needed turning from your back to your side while in a flat bed without using bedrails?: None Help needed moving from lying on your back to sitting on the side of a flat bed without using bedrails?: None Help needed moving to and from a bed to a chair (including a wheelchair)?: A Little Help needed standing up from a chair using your arms (e.g., wheelchair or bedside chair)?: A Little Help needed to walk in hospital room?: A Little Help needed climbing 3-5 steps with a railing? : A Little 6 Click Score: 20    End of Session Equipment Utilized During Treatment: Gait belt Activity Tolerance: Patient tolerated treatment well Patient left: in bed;with call bell/phone within reach;with family/visitor present Nurse Communication: Mobility status PT Visit Diagnosis: Muscle weakness (generalized) (M62.81)    Time: 2951-8841 PT Time Calculation (min) (ACUTE ONLY): 20 min   Charges:   PT Evaluation $PT Eval Low Complexity: 1 Low          Vola Beneke A. Gilford Rile PT, DPT Acute Rehabilitation Services Office [phone]   Linna Hoff 09/13/2022, 3:11 PM

## 2022-09-13 NOTE — TOC Progression Note (Signed)
Transition of Care [facility]) - Initial/Assessment Note    Patient Details  Name: Jeremy [name] MRN: [id number] Date of Birth: [birth year]  Transition of [facility]) CM/SW Contact:    Milinda Antis, LCSWA Phone Number: 09/13/2022, 2:39 PM  Clinical Narrative:                  Transition of Care Department [facility]) has reviewed patient.  Patient admitted for AKI.  We will continue to monitor patient advancement through interdisciplinary progression rounds. If new patient transition needs arise, please place a TOC consult.          Patient Goals and CMS Choice        Expected Discharge Plan and Services                                                Prior Living Arrangements/Services                       Activities of Daily Living Home Assistive Devices/Equipment: Hearing aid, Eyeglasses, Shower chair with back ADL Screening (condition at time of admission) Patient's cognitive ability adequate to safely complete daily activities?: Yes Is the patient deaf or have difficulty hearing?: Yes Does the patient have difficulty seeing, even when wearing glasses/contacts?: No Does the patient have difficulty concentrating, remembering, or making decisions?: No Patient able to express need for assistance with ADLs?: Yes Does the patient have difficulty dressing or bathing?: No Independently performs ADLs?: Yes (appropriate for developmental age) Does the patient have difficulty walking or climbing stairs?: No Weakness of Legs: None Weakness of Arms/Hands: None  Permission Sought/Granted                  Emotional Assessment              Admission diagnosis:  AKI (acute kidney injury) ([place]) [N17.9] Patient Active Problem List   Diagnosis Date Noted   AKI (acute kidney injury) ([place]) 09/12/2022   Lethargy 08/01/2022   Neck pain on right side 04/23/2022   Cellulitis 01/08/2022   Intermediate stage nonexudative age-related macular degeneration of left  eye 08/17/2020   Pseudophakia 08/17/2020   Degenerative retinal drusen of left eye 08/17/2020   Advanced nonexudative age-related macular degeneration of right eye with subfoveal involvement 08/17/2020   Senile purpura ([place]) 08/10/2020   Aortic atherosclerosis ([place]) 08/10/2020   Cerumen impaction 08/10/2020   Skin lesion 07/06/2019   Hyperlipidemia 01/05/2019   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   Cigarette smoker 03/17/2018   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   OSA (obstructive sleep apnea) 05/23/2016   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   Osteoarthritis 01/29/2010   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   History of bladder cancer 10/10/2008   Chronic bronchitis ([place]) 10/10/2008   Diverticulosis of large intestine 10/10/2008   COLONIC POLYPS 12/07/2007   Essential hypertension 12/07/2007   HEMORRHOIDS 12/07/2007   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   PCP:  [name], DO Pharmacy:   [facility] [place], [place] AT [facility] [address] [place] [address] Phone: [phone] Fax: [phone]  [facility] - [place], [place] [address] Phone: [phone] Fax: [phone]  [place] [phone] -  [place], [place] - [phone] [address] [place] [address] Phone: [phone] Fax: [phone]  [place], [place] - [address] [place] [address] Phone: [phone] Fax: [place] [address], [address] AT [facility] [place] [address] Phone: [phone] Fax: [phone]     Social Determinants of Health (SDOH) Interventions    Readmission Risk Interventions     No data to  display

## 2022-09-13 NOTE — Plan of Care (Signed)
  Problem: Education: Goal: Knowledge of General Education information will improve Description: Including pain rating scale, medication(s)/side effects and non-pharmacologic comfort measures Outcome: Progressing   Problem: Health Behavior/Discharge Planning: Goal: Ability to manage health-related needs will improve Outcome: Progressing   Problem: Activity: Goal: Risk for activity intolerance will decrease Outcome: Progressing   Problem: Nutrition: Goal: Adequate nutrition will be maintained Outcome: Progressing   Problem: Pain Managment: Goal: General experience of comfort will improve Outcome: Progressing   Problem: Skin Integrity: Goal: Risk for impaired skin integrity will decrease Outcome: Progressing   Problem: Safety: Goal: Ability to remain free from injury will improve Outcome: Progressing

## 2022-09-13 NOTE — Progress Notes (Signed)
Mobility Specialist Progress Note:   09/13/22 1652  Mobility  Activity Ambulated independently in hallway  Level of Assistance Independent  Assistive Device Other (Comment) (IV Pole)  Distance Ambulated (ft) 250 ft  Activity Response Tolerated well  Mobility Referral Yes  $Mobility charge 1 Mobility   Pt received in bed and agreeable. No complaints. Pt left in bed with all needs met, call bell in reach, and family in room.   Mairi Stagliano Mobility Specialist-Acute Rehab Secure Chat only

## 2022-09-13 NOTE — Consult Note (Signed)
Chief Complaint: Abnormal renal lab values. Acute on chronic kidney disease. Request is for random renal biopsy  Referring Physician(s): Dr. Jerrye Beavers  Supervising Physician: Sandi Mariscal  Patient Status: [facility] - In-pt  History of Present Illness: Jeremy [name] is a 81 y.o. male  inpatient. 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 CKD. Presented to the ED at [facility] on 11.2.23 with labs reflecting abnormal renal function Found to be in AKI with proteinuria. Team is requesting kidney biopsy for further evaluation.   Currently without any significant complaints. Patient alert and laying in bed,calm. Denies any fevers, headache, chest pain, SOB, cough, abdominal pain, nausea, vomiting or bleeding.   Patient refusing to sign consent at this time. Requesting procedure be performed as OP. Team made aware.    Past Medical History:  Diagnosis Date   Cancer [facility])    COPD (chronic obstructive pulmonary disease) ([place])    Enlarged prostate    Hyperlipidemia    Hypertension    Self-catheterizes urinary bladder    pt. does this morning and evening--been doing this since 11/2016   Sleep apnea    not using a cpap machine    Past Surgical History:  Procedure Laterality Date   BACK SURGERY     BLADDER TUMOR EXCISION  2015   benign   BLEPHAROPLASTY  12/2018   LUMBAR LAMINECTOMY/DECOMPRESSION MICRODISCECTOMY Bilateral 08/25/2017   Procedure: Bilateral Lumbar Three- Four, Lumbar Four- Five, Lumbar Five- Sacral One Laminectomy;  Surgeon: Kristeen Miss, MD;  Location: [place];  Service: Neurosurgery;  Laterality: Bilateral;  Bilateral L3-4 L4-5 L5-S1 Laminectomy   SKIN CANCER EXCISION     Surg x2...   TONSILLECTOMY AND ADENOIDECTOMY     Surg as a child...   TRANSURETHRAL RESECTION OF BLADDER TUMOR N/A 01/03/2022   Procedure: TRANSURETHRAL RESECTION OF BLADDER TUMOR (TURBT)/ CYSTOSCOPY/ EXAM UNDER ANESTHESIA, BILATERAL RETROGRADE/  BLADDER BIOPSIES;  Surgeon: Raynelle Bring, MD;  Location: [facility];  Service: Urology;  Laterality: N/A;  GENERAL ANESTHESIA WITH PARALYSIS    Allergies: Sulfa antibiotics and Penicillins  Medications: Prior to Admission medications   Medication Sig Start Date End Date Taking? Authorizing Provider  acetaminophen (TYLENOL) 500 MG tablet Take 1,000 mg by mouth every 6 (six) hours as needed for moderate pain.   Yes [provider]  amLODipine (NORVASC) 10 MG tablet TAKE 1 TABLET EVERY DAY Patient taking differently: Take 10 mg by mouth every evening. 03/06/21  Yes [name], DO  Camphor-Eucalyptus-Menthol (VICKS VAPORUB EX) Apply 1 application topically at bedtime as needed (congestion).   Yes [provider]  cholecalciferol (VITAMIN D3) 25 MCG (1000 UNIT) tablet Take 1,000 Units by mouth daily.   Yes [provider]  diphenhydrAMINE-zinc acetate (BENADRYL) cream Apply 1 Application topically daily as needed for itching.   Yes [provider]  guaiFENesin (MUCINEX) 600 MG 12 hr tablet Take 1,200 mg by mouth 2 (two) times daily as needed for cough.   Yes [provider]  hydrALAZINE (APRESOLINE) 50 MG tablet Take 50 mg by mouth 2 (two) times daily. 09/11/22  Yes [provider]  METAMUCIL FIBER PO Take 2 capsules by mouth every evening.   Yes [provider]  metoprolol succinate (TOPROL-XL) 50 MG 24 hr tablet TAKE 1 TABLET DAILY WITH OR IMMEDIATELY FOLLOWING A MEAL Patient taking differently: Take 50 mg by mouth every evening. 10/15/21  Yes [name], DO  olopatadine (PATANOL) 0.1 % ophthalmic solution Place 1 drop into both  eyes daily as needed for dry eyes. 10/09/21  Yes [provider]  simvastatin (ZOCOR) 40 MG tablet TAKE 1 TABLET EVERY DAY Patient taking differently: Take 40 mg by mouth daily. 06/28/22  Yes [name], DO  tamsulosin (FLOMAX) 0.4 MG CAPS Take 0.8 mg by mouth at bedtime.   Yes [provider]  cephALEXin (KEFLEX) 500 MG capsule Take 1 capsule (500 mg total) by mouth 4 (four) times daily. Patient not taking: Reported on 09/12/2022 07/04/22   Fredia Sorrow, MD  losartan (COZAAR) 100 MG tablet Take 1 tablet (100 mg total) by mouth every evening. Patient not taking: Reported on 09/12/2022 04/11/22   [name], DO  methylPREDNISolone (MEDROL DOSEPAK) 4 MG TBPK tablet Taper as directed on packaging. Patient not taking: Reported on 09/12/2022 04/23/22   [name], DO     Family History  Problem Relation Age of Onset   Colon cancer Neg Hx    Stomach cancer Neg Hx    Rectal cancer Neg Hx     Social History   Socioeconomic History   Marital status: Married    Spouse name: Not on file   Number of children: Not on file   Years of education: Not on file   Highest education level: Not on file  Occupational History   Not on file  Tobacco Use   Smoking status: Every Day    Packs/day: 0.50    Years: 50.00    Total pack years: 25.00    Types: Cigarettes    Start date: 12/29/1961   Smokeless tobacco: Never   Tobacco comments:    Smokes 1/2 pack a day   Vaping Use   Vaping Use: Never used  Substance and Sexual Activity   Alcohol use: Yes    Comment: couple glasses with dinner daily   Drug use: No   Sexual activity: Not on file  Other Topics Concern   Not on file  Social History Narrative   Lives with wife in a 2 story home.  Has 2 children.  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, commissioning)   Social Determinants of Health   Financial Resource Strain: Not on file  Food Insecurity: No Food Insecurity (09/12/2022)   Hunger Vital Sign    Worried About Running Out of Food in the Last Year: Never true    Ran Out of Food in the Last Year: Never true  Transportation Needs: No Transportation Needs (09/12/2022)   PRAPARE - Hydrologist (Medical): No    Lack of Transportation (Non-Medical): No  Physical Activity: Not on file   Stress: Not on file  Social Connections: Not on file     Review of Systems: A 12 point ROS discussed and pertinent positives are indicated in the HPI above.  All other systems are negative.  Review of Systems  Constitutional:  Negative for fever.  HENT:  Negative for congestion.   Respiratory:  Negative for cough and shortness of breath.   Cardiovascular:  Negative for chest pain.  Gastrointestinal:  Negative for abdominal pain.  Neurological:  Negative for headaches.  Psychiatric/Behavioral:  Negative for behavioral problems and confusion.     Vital Signs: BP (!) 137/59 (BP Location: Left Arm)   Pulse 77   Temp 98.1 F (36.7 C) (Oral)   Resp 20   Ht '5\' 10"'$  (1.778 m)   Wt 166 lb 10.7 oz (75.6 kg)   SpO2 92%   BMI 23.91 kg/m  Physical Exam Vitals and nursing note reviewed.  Constitutional:      Appearance: He is well-developed.  HENT:     Head: Normocephalic.  Cardiovascular:     Rate and Rhythm: Normal rate.  Pulmonary:     Effort: Pulmonary effort is normal.  Genitourinary:    Comments: Foley catheter in place. Musculoskeletal:        General: Normal range of motion.     Cervical back: Normal range of motion.  Skin:    General: Skin is dry.  Neurological:     Mental Status: He is alert and oriented to person, place, and time.     Imaging: US RENAL  Result Date: 09/11/2022 CLINICAL DATA:  Acute on chronic kidney disease. EXAM: RENAL / URINARY TRACT ULTRASOUND COMPLETE COMPARISON:  May 02, 2022. FINDINGS: Right Kidney: Renal measurements: 10.5 x 4.7 x 3.9 cm = volume: 99 mL. Two simple cysts are noted, the largest measuring 2.5 cm in upper pole. Increased echogenicity of renal parenchyma is noted. No mass or hydronephrosis visualized. Left Kidney: Renal measurements: 12.1 x 6.2 x 5.8 cm = volume: 228 mL. 1 cm simple cyst is seen in upper pole. Increased echogenicity of renal parenchyma is noted. No mass or hydronephrosis visualized. Bladder: Appears normal  for degree of bladder distention. Left ureteral jet is noted. Other: None. IMPRESSION: Increased echogenicity of renal parenchyma is noted bilaterally suggesting medical renal disease. No hydronephrosis or renal obstruction is noted. Electronically Signed   By: Marijo Conception M.D.   On: 09/11/2022 16:54    Labs:  CBC: Recent Labs    12/25/21 0759 04/23/22 0000 09/12/22 1135 09/13/22 0214  WBC 7.6 8.1 6.3 6.7  HGB 14.5 14.2 9.4* 8.7*  HCT 44.2 42.3 30.2* 26.8*  PLT 240 260 249 224    COAGS: No results for input(s): "INR", "APTT" in the last 8760 hours.  BMP: Recent Labs    12/25/21 0759 04/23/22 0000 09/12/22 1135 09/13/22 0214  NA 135 140 139 142  K 4.7 5.0 5.5* 4.8  CL 101 103 110 110  CO2 26 25 15* 17*  GLUCOSE 86 100* 122* 130*  BUN 45* 61* 97* 96*  CALCIUM 8.5* 9.2 8.2* 7.9*  CREATININE 2.03* 3.23* 7.82* 7.85*  GFRNONAA 32*  --  6* 6*    LIVER FUNCTION TESTS: Recent Labs    04/23/22 0000 09/13/22 0214  BILITOT 0.7  --   AST 10  --   ALT 12  --   PROT 7.1  --   ALBUMIN  --  2.7*    Assessment and Plan:  81 y.o. male inpatient. 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 CKD. Presented to the ED at [facility] on 11.2.23 with labs reflecting abnormal renal function Found to be in AKI with proteinuria. Team is requesting kidney biopsy for further evaluation.   Labs.BUN 96, Cr 7.85, Albumin 2.7, GFR < 6.  All medications are within acceptable parameters. Allergies include Sulfa and PCN.   IR consulted for possible random renal biopsy. Case has been reviewed and procedure approved by Dr. Pascal Lux. Patient tentatively scheduled for 11.6.23.  Team instructed to: Keep Patient to be NPO after midnight on 11.6.23 IR will call patient when ready.  Should patient be discharged prior to scheduled procedure please contact IR.  Thank you for this interesting consult.  I greatly enjoyed meeting TEIGEN BELLIN and look  forward to participating in their care.  A copy of this report  was sent to the requesting provider on this date.  Electronically Signed: Jacqualine Mau, NP 09/13/2022, 10:16 AM   I spent a total of 40 Minutes    in face to face in clinical consultation, greater than 50% of which was counseling/coordinating care for random renal biopsy

## 2022-09-13 NOTE — Progress Notes (Signed)
Pt arrived to floor on [facility], pt received neb treatment for wheezing/chronic bronchitis per MD order. Pt requiring 4LNC to maintain O2 sat 92%. Pt has strong, congested cough, states " I've had this cough for weeks". Pt has mild crackles upon assessment. Pt denies being in acute distress. No distress noted.

## 2022-09-13 NOTE — Progress Notes (Signed)
Spoke with Elgergawy, MD. Patient to be kept NPO after midnight pending renal artery duplex in the morning.   Darlin Coco, RDMS, RVT

## 2022-09-14 ENCOUNTER — Inpatient Hospital Stay (HOSPITAL_COMMUNITY): Payer: Medicare Other

## 2022-09-14 DIAGNOSIS — I169 Hypertensive crisis, unspecified: Secondary | ICD-10-CM | POA: Diagnosis not present

## 2022-09-14 DIAGNOSIS — N185 Chronic kidney disease, stage 5: Secondary | ICD-10-CM | POA: Diagnosis not present

## 2022-09-14 DIAGNOSIS — I10 Essential (primary) hypertension: Secondary | ICD-10-CM | POA: Diagnosis not present

## 2022-09-14 DIAGNOSIS — D631 Anemia in chronic kidney disease: Secondary | ICD-10-CM | POA: Diagnosis not present

## 2022-09-14 DIAGNOSIS — N179 Acute kidney failure, unspecified: Secondary | ICD-10-CM | POA: Diagnosis not present

## 2022-09-14 LAB — CBC
HCT: 23.5 % — ABNORMAL LOW (ref 39.0–52.0)
Hemoglobin: 7.8 g/dL — ABNORMAL LOW (ref 13.0–17.0)
MCH: 30.7 pg (ref 26.0–34.0)
MCHC: 33.2 g/dL (ref 30.0–36.0)
MCV: 92.5 fL (ref 80.0–100.0)
Platelets: 189 10*3/uL (ref 150–400)
RBC: 2.54 MIL/uL — ABNORMAL LOW (ref 4.22–5.81)
RDW: 15.1 % (ref 11.5–15.5)
WBC: 6.2 10*3/uL (ref 4.0–10.5)
nRBC: 0 % (ref 0.0–0.2)

## 2022-09-14 LAB — RENAL FUNCTION PANEL
Albumin: 2.6 g/dL — ABNORMAL LOW (ref 3.5–5.0)
Anion gap: 17 — ABNORMAL HIGH (ref 5–15)
BUN: 92 mg/dL — ABNORMAL HIGH (ref 8–23)
CO2: 21 mmol/L — ABNORMAL LOW (ref 22–32)
Calcium: 7.8 mg/dL — ABNORMAL LOW (ref 8.9–10.3)
Chloride: 105 mmol/L (ref 98–111)
Creatinine, Ser: 7.67 mg/dL — ABNORMAL HIGH (ref 0.61–1.24)
GFR, Estimated: 7 mL/min — ABNORMAL LOW (ref >60)
Glucose, Bld: 85 mg/dL (ref 70–99)
Phosphorus: 7.8 mg/dL — ABNORMAL HIGH (ref 2.5–4.6)
Potassium: 4.1 mmol/L (ref 3.5–5.1)
Sodium: 143 mmol/L (ref 135–145)

## 2022-09-14 LAB — PROTEIN, URINE, 24 HOUR
Collection Interval-UPROT: 24 hours
Protein, 24H Urine: 2899 mg/d — ABNORMAL HIGH (ref 50–100)
Protein, Urine: 223 mg/dL
Urine Total Volume-UPROT: 1300 mL

## 2022-09-14 LAB — HEMOGLOBIN AND HEMATOCRIT, BLOOD
HCT: 27.2 % — ABNORMAL LOW (ref 39.0–52.0)
Hemoglobin: 9 g/dL — ABNORMAL LOW (ref 13.0–17.0)

## 2022-09-14 LAB — PREPARE RBC (CROSSMATCH)

## 2022-09-14 LAB — ABO/RH: ABO/RH(D): O POS

## 2022-09-14 MED ORDER — CHLORHEXIDINE GLUCONATE CLOTH 2 % EX PADS
6.0000 | MEDICATED_PAD | Freq: Every day | CUTANEOUS | Status: DC
  Administered 2022-09-14 – 2022-09-16 (×3): 6 via TOPICAL

## 2022-09-14 MED ORDER — SODIUM CHLORIDE 0.9% IV SOLUTION: Freq: Once | INTRAVENOUS | Status: DC

## 2022-09-14 MED ORDER — ALBUTEROL SULFATE (2.5 MG/3ML) 0.083% IN NEBU
2.5000 mg | INHALATION_SOLUTION | RESPIRATORY_TRACT | Status: DC | PRN
  Administered 2022-09-15 – 2022-09-16 (×2): 2.5 mg via RESPIRATORY_TRACT
  Filled 2022-09-14 (×2): qty 3

## 2022-09-14 MED ORDER — DARBEPOETIN ALFA 60 MCG/0.3ML IJ SOSY
60.0000 ug | PREFILLED_SYRINGE | INTRAMUSCULAR | Status: DC
  Administered 2022-09-14: 60 ug via SUBCUTANEOUS
  Filled 2022-09-14: qty 0.3

## 2022-09-14 NOTE — Progress Notes (Signed)
PROGRESS NOTE    Jeremy [name]  GMW:[id number] DOB: 1940/11/20 DOA: 09/12/2022 PCP: [name], DO   Chief Complaint  Patient presents with   abnormal labs    Brief Narrative:   Jeremy [name]  is a 81 y.o. male, with medical history significant of bladder cancer (s/p of excision ), self catheterization, BPH, OSA not on CPAP, hypertension, hyperlipidemia, CKD-4, who was sent by his nephrologist for evaluation of worsening renal function, and is following with Dr. Royce Macadamia, it was noted with increased serum creatinine over the last few weeks, patient on losartan for blood pressure, dose was increased on 07/24/2022, creatinine has been increased to trending up from baseline of 2.9, to 6 on 08/30/2022, he was seen again on 09/11/2022, for which his creatinine went up to 7.36, he was taken off losartan then, and started on hydralazine instead, renal ultrasound 09/11/2022 without evidence of hydronephrosis, patient was sent to ED for further evaluation given his worsening renal function, patient reports he self catheter times a day, he denies any fever, chills or any new complaints, he denies any new NSAIDs use, hematuria, pyuria, or worsening lower extremity edema, no dyspnea or orthopnea. -In ED blood work significant for creatinine of 7.8, potassium of 5.5, EKG still pending, pressure elevated 170/64, [facility] hospitalist consulted to admit.    Assessment & Plan:   Principal Problem:   AKI (acute kidney injury) ([place]) Active Problems:   History of bladder cancer   Essential hypertension   Chronic bronchitis ([facility])   Benign prostatic hyperplasia with urinary obstruction   OSA (obstructive sleep apnea)   Cigarette smoker   Hyperlipidemia      AKI on CKD stage IV -Baseline creatinine 2.9, with rapid increase over the last few weeks, 7.8 on admission. -On full renal biopsy on Monday. -Continue with IV bicarb. -vascular Doppler done today, results are pending -Avoid nephrotoxic medications. -24  hours urine collection showing 2.8 g / 24 hours of protein     Hyperkalemia -received Lokelma, improved this a.m. at 4.8.   Hypertension Blood pressure is elevated, continue with home regimen including Norvasc, hydralazine and metoprolol,  -To hold losartan - will add prn hydralazine    History of BPH/urinary retention -Patient does self cath at home, inserted t Foley catheter during hospital stay, continue with Flomax   OSA -Not use CPAP at home   Anemia of chronic kidney disease -Renal function at baseline -Iron panel per renal   Hyperlipidemia -Continue with statin   Chronic bronchitis -We will keep on as needed albuterol given some mild wheezing   Tobacco abuse - Continue with nicotine patch    -UTI  - urine  culture growing gram-negative rods, continue with IV Rocephin   Anemia of chronic kidney disease -  hemoglobin 7.8 today, will transfuse 1 unit PRBC and he will be started on Procrit per renal.      DVT prophylaxis: heparin Code Status: Full Family Communication: D/W  with wife and son at bedside Disposition:   Status is: Inpatient    Consultants:  renal   Subjective:  No significant events overnight, he denies any complaints today.     Objective: Vitals:   09/13/22 2126 09/14/22 0525 09/14/22 0900 09/14/22 0904  BP:  (!) 133/53 (!) 145/54   Pulse: 75 (!) 44 66   Resp: '16 18 20   '$ Temp:  98.3 F (36.8 C) 98.5 F (36.9 C)   TempSrc:   Oral   SpO2: 96% 90% 96% 96%  Weight:  Height:        Intake/Output Summary (Last 24 hours) at 09/14/2022 1204 Last data filed at 09/14/2022 1139 Gross per 24 hour  Intake 1852.03 ml  Output 1200 ml  Net 652.03 ml   Filed Weights   09/12/22 1125 09/12/22 2012  Weight: 73.9 kg 75.6 kg    Examination:  Awake Alert, Oriented X 3, No new F.N deficits, Normal affect Symmetrical Chest wall movement, Good air movement bilaterally, CTAB RRR,No Gallops,Rubs or new Murmurs, No Parasternal Heave +ve  B.Sounds, Abd Soft, No tenderness, No rebound - guarding or rigidity. No Cyanosis, Clubbing or edema, No new Rash or bruise       Data Reviewed: I have personally reviewed following labs and imaging studies  CBC: Recent Labs  Lab 09/12/22 1135 09/13/22 0214 09/14/22 0258  WBC 6.3 6.7 6.2  NEUTROABS 4.4 4.9  --   HGB 9.4* 8.7* 7.8*  HCT 30.2* 26.8* 23.5*  MCV 97.1 94.4 92.5  PLT 249 224 811    Basic Metabolic Panel: Recent Labs  Lab 09/12/22 1135 09/13/22 0214 09/14/22 0258  NA 139 142 143  K 5.5* 4.8 4.1  CL 110 110 105  CO2 15* 17* 21*  GLUCOSE 122* 130* 85  BUN 97* 96* 92*  CREATININE 7.82* 7.85* 7.67*  CALCIUM 8.2* 7.9* 7.8*  PHOS  --  8.5* 7.8*    GFR: Estimated Creatinine Clearance: 7.7 mL/min (A) (by C-G formula based on SCr of 7.67 mg/dL (H)).  Liver Function Tests: Recent Labs  Lab 09/13/22 0214 09/14/22 0258  ALBUMIN 2.7* 2.6*    CBG: No results for input(s): "GLUCAP" in the last 168 hours.   Recent Results (from the past 240 hour(s))  Urine Culture     Status: Abnormal (Preliminary result)   Collection Time: 09/12/22  5:23 PM   Specimen: Urine, Clean Catch  Result Value Ref Range Status   Specimen Description URINE, CLEAN CATCH  Final   Special Requests NONE  Final   Culture (A)  Final    >=100,000 COLONIES/mL GRAM NEGATIVE RODS SUSCEPTIBILITIES TO FOLLOW Performed at [facility], [address], [place], [place] [address]    Report Status PENDING  Incomplete         Radiology Studies: VAS US RENAL ARTERY DUPLEX  Result Date: 09/14/2022 ABDOMINAL VISCERAL Patient Name:  Jeremy [name]  Date of Exam:   09/14/2022 Medical Rec #: [id number]      Accession #:    [id number] Date of Birth: Dec 15, 1940      Patient Gender: M Patient Age:   39 years Exam Location:  [facility] Procedure:      VAS US RENAL ARTERY DUPLEX Referring Phys: [id number] Asmi Fugere S Ivon Roedel  -------------------------------------------------------------------------------- Indications: HTN High Risk Factors: Hypertension, hyperlipidemia. Limitations: Air/bowel gas and patient coughing. Performing Technologist: Archie Patten RVS  Examination Guidelines: A complete evaluation includes B-mode imaging, spectral Doppler, color Doppler, and power Doppler as needed of all accessible portions of each vessel. Bilateral testing is considered an integral part of a complete examination. Limited examinations for reoccurring indications may be performed as noted.  Duplex Findings: +--------------------+--------+--------+------+--------+ Mesenteric          PSV cm/sEDV cm/sPlaqueComments +--------------------+--------+--------+------+--------+ Aorta at SMA          187                          +--------------------+--------+--------+------+--------+ Celiac Artery Origin  170  stenotic +--------------------+--------+--------+------+--------+ SMA Origin            404      55                  +--------------------+--------+--------+------+--------+ SMA Proximal          375      35                  +--------------------+--------+--------+------+--------+    +------------------+--------+--------+-------+ Right Renal ArteryPSV cm/sEDV cm/sComment +------------------+--------+--------+-------+ Origin              103      15           +------------------+--------+--------+-------+ Proximal            102      12           +------------------+--------+--------+-------+ Mid                 102      20           +------------------+--------+--------+-------+ Distal               91      16           +------------------+--------+--------+-------+ +-----------------+--------+--------+-------+ Left Renal ArteryPSV cm/sEDV cm/sComment +-----------------+--------+--------+-------+ Origin              35      9             +-----------------+--------+--------+-------+ Proximal            54      4            +-----------------+--------+--------+-------+ Mid                 42      11           +-----------------+--------+--------+-------+ Distal              34      11           +-----------------+--------+--------+-------+ +------------+--------+--------+----+-----------+--------+--------+----+ Right KidneyPSV cm/sEDV cm/sRI  Left KidneyPSV cm/sEDV cm/sRI   +------------+--------+--------+----+-----------+--------+--------+----+ Upper Pole  22      10      0.54Upper Pole 14      3       0.80 +------------+--------+--------+----+-----------+--------+--------+----+ Mid         10      4       0.64Mid        54      11      0.79 +------------+--------+--------+----+-----------+--------+--------+----+ Lower Pole  8       4       0.55Lower Pole 28      6       0.78 +------------+--------+--------+----+-----------+--------+--------+----+ Hilar       44      6       0.86Hilar      37      10      0.73 +------------+--------+--------+----+-----------+--------+--------+----+ +------------------+-----+------------------+-----+ Right Kidney           Left Kidney             +------------------+-----+------------------+-----+ RAR                    RAR                     +------------------+-----+------------------+-----+ RAR (manual)      0.55 RAR (manual)  0.29  +------------------+-----+------------------+-----+ Cortex                 Cortex                  +------------------+-----+------------------+-----+ Cortex thickness       Corex thickness         +------------------+-----+------------------+-----+ Kidney length (cm)11.50Kidney length (cm)12.00 +------------------+-----+------------------+-----+   Summary: Renal:  Right: Normal size right kidney. Normal right Resisitive Index.        Cyst(s) noted. Left:  Abnormal left Resisitve Index.  Normal size of left kidney.        Cyst(s) noted. Decreased velocities in the left renal artery        artery compared to the right. 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 Stenotic flow noted in the Celiac artery.  *See table(s) above for measurements and observations.     Preliminary         Scheduled Meds:  sodium chloride   Intravenous Once   amLODipine  10 mg Oral QPM   atorvastatin  20 mg Oral Daily   Chlorhexidine Gluconate Cloth  6 each Topical Daily   cholecalciferol  1,000 Units Oral Daily   darbepoetin (ARANESP) injection - NON-DIALYSIS  60 mcg Subcutaneous Q Sat-1800   heparin injection (subcutaneous)  5,000 Units Subcutaneous Q8H   hydrALAZINE  50 mg Oral BID   melatonin  5 mg Oral QHS   metoprolol succinate  50 mg Oral QPM   tamsulosin  0.8 mg Oral QHS   Continuous Infusions:  cefTRIAXone (ROCEPHIN)  IV 1 g (09/13/22 1712)   sodium bicarbonate 150 mEq in dextrose 5 % 1,150 mL infusion 75 mL/hr at 09/14/22 0241     LOS: 2 days       Phillips Climes, MD [facility]   To contact the attending provider between 7A-7P or the covering provider during after hours 7P-7A, please log into the web site [email or web address] and access using universal  password for that web site. If you do not have the password, please call the hospital operator.  09/14/2022, 12:04 PM

## 2022-09-14 NOTE — Progress Notes (Signed)
Renal artery duplex has been completed.   Preliminary results in CV Proc.   Jeremy [name] 09/14/2022 10:42 AM

## 2022-09-14 NOTE — Progress Notes (Signed)
[facility] Progress Note   Assessment/ Plan:    AKI/CKD stage IV - rapid decline in renal function after increasing losartan dose to 50 mg on 07/24/22.  Serology workup negative, renal US without hydronephrosis.  Will order renal artery duplex to r/o RAS (given long tobacco history).  Gentle IVF's overnight and follow UOP and renal function.  He has no uremic symptoms.  No urgent indication for dialysis, however we did discuss the possible need for RRT in the near future if he does not improve off of ARB.  Avoid nephrotoxic medications including NSAIDs and iodinated intravenous contrast exposure unless the latter is absolutely indicated.   Preferred narcotic agents for pain control are hydromorphone, fentanyl, and methadone. Morphine should not be used.  Avoid Baclofen and avoid oral sodium phosphate and magnesium citrate based laxatives / bowel preps.  Continue strict Input and Output monitoring. Will monitor the patient closely with you and intervene or adjust therapy as indicated by changes in clinical status/labs  Pt initially unwilling to stay for workup, now is willing Getting renal artery duplex today Biopsy Monday Ceftriaxone for UTI  Proteinuria - UPC 6862 mg/g on 06/14/22.  Will order 24 hour urine for protein.  Had negative SPEP and UPEP.  Biopsy Monday HTN - new home regimen amlodipine 10 mg daily, hydralazine 50 mg bid, metoprolol ER 50 mg daily.  Continue to hold losartan for now.  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 Anemia of CKD stage IV - Hgb has dropped from 12.2 on 07/23/22 to 9.4 today.  Will check iron stores and guaiac stool.  Getting 1 u pRBCs today, will order aranesp today OSA - does not use CPAP  Subjective:    Decided to stay yesterday after smoking.  Cr a little better this AM, down in vascular lab for renal artery ultrasound.     Objective:   BP  (!) 145/54 (BP Location: Left Arm)   Pulse 66   Temp 98.5 F (36.9 C) (Oral)   Resp 20   Ht '5\' 10"'$  (1.778 m)   Wt 75.6 kg   SpO2 96%   BMI 23.91 kg/m   Intake/Output Summary (Last 24 hours) at 09/14/2022 1028 Last data filed at 09/14/2022 0600 Gross per 24 hour  Intake 1852.03 ml  Output 750 ml  Net 1102.03 ml   Weight change:   Physical Exam:  unavailable to examine today 11/4, see from yesterday 11/3 TFT:DDUKGUR in bed Resp:unlabored WOB Abd: + Foley in place Ext: does not appear to be swelling  Imaging: No results found.  Labs: BMET Recent Labs  Lab 09/12/22 1135 09/13/22 0214 09/14/22 0258  NA 139 142 143  K 5.5* 4.8 4.1  CL 110 110 105  CO2 15* 17* 21*  GLUCOSE 122* 130* 85  BUN 97* 96* 92*  CREATININE 7.82* 7.85* 7.67*  CALCIUM 8.2* 7.9* 7.8*  PHOS  --  8.5* 7.8*   CBC Recent Labs  Lab 09/12/22 1135 09/13/22 0214 09/14/22 0258  WBC 6.3 6.7 6.2  NEUTROABS 4.4 4.9  --   HGB 9.4* 8.7* 7.8*  HCT 30.2* 26.8* 23.5*  MCV 97.1 94.4 92.5  PLT 249 224 189    Medications:     albuterol  2.5 mg Nebulization QID   amLODipine  10 mg Oral QPM   atorvastatin  20 mg Oral Daily   Chlorhexidine Gluconate Cloth  6 each Topical Daily  cholecalciferol  1,000 Units Oral Daily   heparin injection (subcutaneous)  5,000 Units Subcutaneous Q8H   hydrALAZINE  50 mg Oral BID   melatonin  5 mg Oral QHS   metoprolol succinate  50 mg Oral QPM   tamsulosin  0.8 mg Oral QHS    Madelon Lips MD 09/14/2022, 10:28 AM

## 2022-09-14 NOTE — Progress Notes (Signed)
Mobility Specialist Progress Note:   09/14/22 1333  Mobility  Activity Ambulated independently in hallway  Level of Assistance Independent  Assistive Device Other (Comment) (IV Pole)  Distance Ambulated (ft) 500 ft  Activity Response Tolerated well  Mobility Referral Yes  $Mobility charge 1 Mobility   Pt received in bed and agreeable. No complaints. Pt left in bathroom with all needs met and call bell in reach.   Vonnie Spagnolo Mobility Specialist-Acute Rehab Secure Chat only

## 2022-09-14 NOTE — Progress Notes (Signed)
Mr. Eddleman now agreeable to renal biopsy. Consent is signed and IR Tentatively planned for Monday, schedule permitting. NPO @ [facility] Sunday night.  Risks and benefits of renal biopsy were discussed with the patient and son including, but not limited to bleeding, infection, damage to adjacent structures or low yield requiring additional tests.  All of the questions were answered and there is agreement to proceed.  Electronically Signed: Pasty Spillers, PA-C 09/14/2022, 12:54 PM

## 2022-09-15 DIAGNOSIS — I10 Essential (primary) hypertension: Secondary | ICD-10-CM | POA: Diagnosis not present

## 2022-09-15 DIAGNOSIS — N179 Acute kidney failure, unspecified: Secondary | ICD-10-CM | POA: Diagnosis not present

## 2022-09-15 LAB — TYPE AND SCREEN
ABO/RH(D): O POS
Antibody Screen: NEGATIVE
Unit division: 0

## 2022-09-15 LAB — RENAL FUNCTION PANEL
Albumin: 2.6 g/dL — ABNORMAL LOW (ref 3.5–5.0)
Anion gap: 13 (ref 5–15)
BUN: 87 mg/dL — ABNORMAL HIGH (ref 8–23)
CO2: 24 mmol/L (ref 22–32)
Calcium: 7.7 mg/dL — ABNORMAL LOW (ref 8.9–10.3)
Chloride: 105 mmol/L (ref 98–111)
Creatinine, Ser: 7.3 mg/dL — ABNORMAL HIGH (ref 0.61–1.24)
GFR, Estimated: 7 mL/min — ABNORMAL LOW (ref >60)
Glucose, Bld: 101 mg/dL — ABNORMAL HIGH (ref 70–99)
Phosphorus: 7 mg/dL — ABNORMAL HIGH (ref 2.5–4.6)
Potassium: 3.9 mmol/L (ref 3.5–5.1)
Sodium: 142 mmol/L (ref 135–145)

## 2022-09-15 LAB — CBC
HCT: 26.3 % — ABNORMAL LOW (ref 39.0–52.0)
Hemoglobin: 9.1 g/dL — ABNORMAL LOW (ref 13.0–17.0)
MCH: 30.7 pg (ref 26.0–34.0)
MCHC: 34.6 g/dL (ref 30.0–36.0)
MCV: 88.9 fL (ref 80.0–100.0)
Platelets: 203 10*3/uL (ref 150–400)
RBC: 2.96 MIL/uL — ABNORMAL LOW (ref 4.22–5.81)
RDW: 16.4 % — ABNORMAL HIGH (ref 11.5–15.5)
WBC: 7.4 10*3/uL (ref 4.0–10.5)
nRBC: 0 % (ref 0.0–0.2)

## 2022-09-15 LAB — BPAM RBC
Blood Product Expiration Date: 202311192359
ISSUE DATE / TIME: 202311041445
Unit Type and Rh: 5100

## 2022-09-15 MED ORDER — HYDRALAZINE HCL 50 MG PO TABS
50.0000 mg | ORAL_TABLET | Freq: Three times a day (TID) | ORAL | Status: DC
  Administered 2022-09-15 – 2022-09-17 (×6): 50 mg via ORAL
  Filled 2022-09-15 (×7): qty 1

## 2022-09-15 NOTE — Progress Notes (Signed)
PROGRESS NOTE    Jeremy [name]  JWJ:[id number] DOB: 1941-10-01 DOA: 09/12/2022 PCP: [name], DO   Chief Complaint  Patient presents with   abnormal labs    Brief Narrative:   Jeremy [name]  is a 81 y.o. male, with medical history significant of bladder cancer (s/p of excision ), self catheterization, BPH, OSA not on CPAP, hypertension, hyperlipidemia, CKD-4, who was sent by his nephrologist for evaluation of worsening renal function, and is following with Dr. Royce Macadamia, it was noted with increased serum creatinine over the last few weeks, patient on losartan for blood pressure, dose was increased on 07/24/2022, creatinine has been increased to trending up from baseline of 2.9, to 6 on 08/30/2022, he was seen again on 09/11/2022, for which his creatinine went up to 7.36, he was taken off losartan then, and started on hydralazine instead, renal ultrasound 09/11/2022 without evidence of hydronephrosis, patient was sent to ED for further evaluation given his worsening renal function, patient reports he self catheter times a day, he denies any fever, chills or any new complaints, he denies any new NSAIDs use, hematuria, pyuria, or worsening lower extremity edema, no dyspnea or orthopnea. -In ED blood work significant for creatinine of 7.8, potassium of 5.5, EKG still pending, pressure elevated 170/64, [facility] hospitalist consulted to admit.    Assessment & Plan:   Principal Problem:   AKI (acute kidney injury) ([place]) Active Problems:   History of bladder cancer   Essential hypertension   Chronic bronchitis (HCC)   Benign prostatic hyperplasia with urinary obstruction   OSA (obstructive sleep apnea)   Cigarette smoker   Hyperlipidemia      AKI on CKD stage IV -Baseline creatinine 2.9, with rapid increase over the last few weeks, 7.8 on admission. -for  renal biopsy on Monday. -Continue with IV bicarb. -vascular Doppler no evidence of renal artery stenosis -Avoid nephrotoxic  medications. -24 hours urine collection showing 2.8 g / 24 hours of protein     Hyperkalemia -Resolved, continue to monitor   Hypertension Blood pressure is elevated, continue with home regimen including Norvasc, hydralazine and metoprolol, it remains elevated today, so we will increase his hydralazine to 3 times daily. -Continue to hold losartan - will add prn hydralazine    History of BPH/urinary retention -Patient does self cath at home, inserted t Foley catheter during hospital stay, continue with Flomax   OSA -Not use CPAP at home   Anemia of chronic kidney disease -Renal function at baseline -Iron panel per renal   Hyperlipidemia -Continue with statin   Chronic bronchitis -We will keep on as needed albuterol given some mild wheezing   Tobacco abuse - Continue with nicotine patch    -UTI  - urine  culture growing E. coli, continue with IV Rocephin   Anemia of chronic kidney disease -received  10 unit PRBC and Procrit yesterday      DVT prophylaxis: heparin Code Status: Full Family Communication: Discussed with wife at bedside today Disposition:   Status is: Inpatient    Consultants:  renal   Subjective:  No significant events overnight, he denies any complaints today.     Objective: Vitals:   09/14/22 1642 09/14/22 2123 09/15/22 0525 09/15/22 0931  BP: (!) 154/56 (!) 140/56 (!) 150/63 (!) 153/54  Pulse: 70 78 76 69  Resp: '16 19 17   '$ Temp: 98.5 F (36.9 C) 98.7 F (37.1 C) 98.4 F (36.9 C) (!) 97.5 F (36.4 C)  TempSrc: Oral   Oral  SpO2: 92% (!) 87% 91% (!) 85%  Weight:      Height:        Intake/Output Summary (Last 24 hours) at 09/15/2022 1239 Last data filed at 09/15/2022 1000 Gross per 24 hour  Intake 1615 ml  Output 2050 ml  Net -435 ml   Filed Weights   09/12/22 1125 09/12/22 2012  Weight: 73.9 kg 75.6 kg    Examination:  Awake Alert, Oriented X 3, No new F.N deficits, Normal affect Symmetrical Chest wall movement,  Good air movement bilaterally, CTAB RRR,No Gallops,Rubs or new Murmurs, No Parasternal Heave +ve B.Sounds, Abd Soft, No tenderness, No rebound - guarding or rigidity. No Cyanosis, Clubbing or edema, No new Rash or bruise        Data Reviewed: I have personally reviewed following labs and imaging studies  CBC: Recent Labs  Lab 09/12/22 1135 09/13/22 0214 09/14/22 0258 09/14/22 1814 09/15/22 0314  WBC 6.3 6.7 6.2  --  7.4  NEUTROABS 4.4 4.9  --   --   --   HGB 9.4* 8.7* 7.8* 9.0* 9.1*  HCT 30.2* 26.8* 23.5* 27.2* 26.3*  MCV 97.1 94.4 92.5  --  88.9  PLT 249 224 189  --  536    Basic Metabolic Panel: Recent Labs  Lab 09/12/22 1135 09/13/22 0214 09/14/22 0258 09/15/22 0314  NA 139 142 143 142  K 5.5* 4.8 4.1 3.9  CL 110 110 105 105  CO2 15* 17* 21* 24  GLUCOSE 122* 130* 85 101*  BUN 97* 96* 92* 87*  CREATININE 7.82* 7.85* 7.67* 7.30*  CALCIUM 8.2* 7.9* 7.8* 7.7*  PHOS  --  8.5* 7.8* 7.0*    GFR: Estimated Creatinine Clearance: 8.1 mL/min (A) (by C-G formula based on SCr of 7.3 mg/dL (H)).  Liver Function Tests: Recent Labs  Lab 09/13/22 0214 09/14/22 0258 09/15/22 0314  ALBUMIN 2.7* 2.6* 2.6*    CBG: No results for input(s): "GLUCAP" in the last 168 hours.   Recent Results (from the past 240 hour(s))  Urine Culture     Status: Abnormal (Preliminary result)   Collection Time: 09/12/22  5:23 PM   Specimen: Urine, Clean Catch  Result Value Ref Range Status   Specimen Description URINE, CLEAN CATCH  Final   Special Requests NONE  Final   Culture (A)  Final    >=100,000 COLONIES/mL ESCHERICHIA COLI CULTURE REINCUBATED FOR BETTER GROWTH Performed at [facility], [address], [place], [place] [address]    Report Status PENDING  Incomplete   Organism ID, Bacteria ESCHERICHIA COLI (A)  Final      Susceptibility   Escherichia coli - MIC*    AMPICILLIN >=32 RESISTANT Resistant     CEFAZOLIN <=4 SENSITIVE Sensitive     CEFEPIME <=0.12 SENSITIVE  Sensitive     CEFTRIAXONE <=0.25 SENSITIVE Sensitive     CIPROFLOXACIN >=4 RESISTANT Resistant     GENTAMICIN <=1 SENSITIVE Sensitive     IMIPENEM <=0.25 SENSITIVE Sensitive     NITROFURANTOIN <=16 SENSITIVE Sensitive     TRIMETH/SULFA <=20 SENSITIVE Sensitive     AMPICILLIN/SULBACTAM 4 SENSITIVE Sensitive     PIP/TAZO <=4 SENSITIVE Sensitive     * >=100,000 COLONIES/mL ESCHERICHIA COLI         Radiology Studies: VAS US RENAL ARTERY DUPLEX  Result Date: 09/14/2022 ABDOMINAL VISCERAL Patient Name:  Jeremy [name]  Date of Exam:   09/14/2022 Medical Rec #: [id number]      Accession #:  [id number] Date of Birth: 1941-11-08      Patient Gender: M Patient Age:   67 years Exam Location:  [facility] Procedure:      VAS US RENAL ARTERY DUPLEX Referring Phys: [id number] Tannisha Kennington S Robin Pafford -------------------------------------------------------------------------------- Indications: HTN High Risk Factors: Hypertension, hyperlipidemia. Limitations: Air/bowel gas and patient coughing. Performing Technologist: Archie Patten RVS  Examination Guidelines: A complete evaluation includes B-mode imaging, spectral Doppler, color Doppler, and power Doppler as needed of all accessible portions of each vessel. Bilateral testing is considered an integral part of a complete examination. Limited examinations for reoccurring indications may be performed as noted.  Duplex Findings: +--------------------+--------+--------+------+--------+ Mesenteric          PSV cm/sEDV cm/sPlaqueComments +--------------------+--------+--------+------+--------+ Aorta at SMA          187                          +--------------------+--------+--------+------+--------+ Celiac Artery Origin  170                 stenotic +--------------------+--------+--------+------+--------+ SMA Origin            404      55                  +--------------------+--------+--------+------+--------+ SMA Proximal          375      35                   +--------------------+--------+--------+------+--------+    +------------------+--------+--------+-------+ Right Renal ArteryPSV cm/sEDV cm/sComment +------------------+--------+--------+-------+ Origin              103      15           +------------------+--------+--------+-------+ Proximal            102      12           +------------------+--------+--------+-------+ Mid                 102      20           +------------------+--------+--------+-------+ Distal               91      16           +------------------+--------+--------+-------+ +-----------------+--------+--------+-------+ Left Renal ArteryPSV cm/sEDV cm/sComment +-----------------+--------+--------+-------+ Origin              35      9            +-----------------+--------+--------+-------+ Proximal            54      4            +-----------------+--------+--------+-------+ Mid                 42      11           +-----------------+--------+--------+-------+ Distal              34      11           +-----------------+--------+--------+-------+ +------------+--------+--------+----+-----------+--------+--------+----+ Right KidneyPSV cm/sEDV cm/sRI  Left KidneyPSV cm/sEDV cm/sRI   +------------+--------+--------+----+-----------+--------+--------+----+ Upper Pole  22      10      0.54Upper Pole 14      3       0.80 +------------+--------+--------+----+-----------+--------+--------+----+ Mid         10  4       0.64Mid        54      11      0.79 +------------+--------+--------+----+-----------+--------+--------+----+ Lower Pole  8       4       0.55Lower Pole 28      6       0.78 +------------+--------+--------+----+-----------+--------+--------+----+ Hilar       44      6       0.86Hilar      37      10      0.73 +------------+--------+--------+----+-----------+--------+--------+----+  +------------------+-----+------------------+-----+ Right Kidney           Left Kidney             +------------------+-----+------------------+-----+ RAR                    RAR                     +------------------+-----+------------------+-----+ RAR (manual)      0.55 RAR (manual)      0.29  +------------------+-----+------------------+-----+ Cortex                 Cortex                  +------------------+-----+------------------+-----+ Cortex thickness       Corex thickness         +------------------+-----+------------------+-----+ Kidney length (cm)11.50Kidney length (cm)12.00 +------------------+-----+------------------+-----+  Summary: Renal:  Right: Normal size right kidney. Normal right Resisitive Index.        Cyst(s) noted. Left:  Abnormal left Resisitve Index. Normal size of left kidney.        Cyst(s) noted. Decreased velocities in the left renal artery        artery compared to the right. 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 Stenotic flow noted in the Celiac artery.  *See table(s) above for measurements and observations.  Diagnosing physician: Orlie Pollen  Electronically signed by Orlie Pollen on 09/14/2022 at 1:33:46 PM.    Final         Scheduled Meds:  sodium chloride   Intravenous Once   amLODipine  10 mg Oral QPM   atorvastatin  20 mg Oral Daily   Chlorhexidine Gluconate Cloth  6 each Topical Daily   cholecalciferol  1,000 Units Oral Daily   darbepoetin (ARANESP) injection - NON-DIALYSIS  60 mcg Subcutaneous Q Sat-1800   heparin injection (subcutaneous)  5,000 Units Subcutaneous Q8H   hydrALAZINE  50 mg Oral BID   melatonin  5 mg Oral QHS   metoprolol succinate  50 mg Oral QPM   tamsulosin  0.8 mg Oral QHS   Continuous Infusions:  cefTRIAXone (ROCEPHIN)  IV Stopped (09/14/22 1900)   sodium bicarbonate 150 mEq in dextrose 5 % 1,150 mL infusion 75 mL/hr at 09/15/22 1238     LOS: 3 days       Phillips Climes, MD [facility]   To contact the attending provider between 7A-7P or the covering provider during after hours 7P-7A, please log into the web site [email or web address] and access using universal [place] password for that web site. If you do not have the password, please call the hospital operator.  09/15/2022, 12:39 PM

## 2022-09-15 NOTE — Progress Notes (Signed)
Mobility Specialist Progress Note:   09/15/22 1316  Mobility  Activity Ambulated independently in hallway  Level of Assistance Modified independent, requires aide device or extra time  Assistive Device Other (Comment) (IV Pole)  Distance Ambulated (ft) 500 ft  Activity Response Tolerated well  Mobility Referral Yes  $Mobility charge 1 Mobility   Pt received in bed and agreeable. No complaints. Pt left in bed with all needs met, call bell in reach, and family in room.   Jaidy Cottam Mobility Specialist-Acute Rehab Secure Chat only

## 2022-09-15 NOTE — Plan of Care (Signed)
  Problem: Activity: Goal: Risk for activity intolerance will decrease Outcome: Progressing   Problem: Nutrition: Goal: Adequate nutrition will be maintained Outcome: Progressing   Problem: Pain Managment: Goal: General experience of comfort will improve Outcome: Progressing   Problem: Safety: Goal: Ability to remain free from injury will improve Outcome: Progressing   Problem: Skin Integrity: Goal: Risk for impaired skin integrity will decrease Outcome: Progressing   

## 2022-09-15 NOTE — Progress Notes (Signed)
[facility] Progress Note   Assessment/ Plan:    AKI/CKD stage IV - rapid decline in renal function after increasing losartan dose to 50 mg on 07/24/22.  Serology workup negative, renal US without hydronephrosis.  Will order renal artery duplex to r/o RAS (given long tobacco history).  Gentle IVF's overnight and follow UOP and renal function.  He has no uremic symptoms.  No urgent indication for dialysis, however we did discuss the possible need for RRT in the near future if he does not improve off of ARB.  Avoid nephrotoxic medications including NSAIDs and iodinated intravenous contrast exposure unless the latter is absolutely indicated.   Preferred narcotic agents for pain control are hydromorphone, fentanyl, and methadone. Morphine should not be used.  Avoid Baclofen and avoid oral sodium phosphate and magnesium citrate based laxatives / bowel preps.  Continue strict Input and Output monitoring. Will monitor the patient closely with you and intervene or adjust therapy as indicated by changes in clinical status/labs  Pt initially unwilling to stay for workup, now is willing Getting renal artery duplex- no RAS 11/4 Biopsy Monday-- requisition form in chart Ceftriaxone for UTI  Proteinuria - UPC 6862 mg/g on 06/14/22.  Will order 24 hour urine for protein.  Had negative SPEP and UPEP.  Biopsy Monday HTN - new home regimen amlodipine 10 mg daily, hydralazine 50 mg bid, metoprolol ER 50 mg daily.  Continue to hold losartan for now.  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 Anemia of CKD stage IV - Hgb has dropped from 12.2 on 07/23/22 to 9.4 today.  Will check iron stores and guaiac stool.  Getting 1 u pRBCs today, got aranesp Sat OSA - does not use CPAP  Subjective:    Feeling better today   Objective:   BP (!) 153/54   Pulse 69   Temp (!) 97.5 F (36.4 C) (Oral)   Resp 17    Ht '5\' 10"'$  (1.778 m)   Wt 75.6 kg   SpO2 (!) 85%   BMI 23.91 kg/m   Intake/Output Summary (Last 24 hours) at 09/15/2022 1030 Last data filed at 09/15/2022 1000 Gross per 24 hour  Intake 1855 ml  Output 2500 ml  Net -645 ml   Weight change:   Physical Exam:   ASN:[id number] in bed Resp:unlabored WOB Abd: + Foley in place, clear urine Ext: does not appear to be swelling  Imaging: VAS US RENAL ARTERY DUPLEX  Result Date: 09/14/2022 ABDOMINAL VISCERAL Patient Name:  Jeremy [name]  Date of Exam:   09/14/2022 Medical Rec #: [id number]      Accession #:    [id number] Date of Birth: 06-03-1941      Patient Gender: M Patient Age:   81 years Exam Location:  [facility] Procedure:      VAS US RENAL ARTERY DUPLEX Referring Phys: [id number] -------------------------------------------------------------------------------- Indications: HTN High Risk Factors: Hypertension, hyperlipidemia. Limitations: Air/bowel gas and patient coughing. Performing Technologist: Archie Patten RVS  Examination Guidelines: A complete evaluation includes B-mode imaging, spectral Doppler, color Doppler, and power Doppler as needed of all accessible portions of each vessel. Bilateral testing is considered an integral part of a complete examination. Limited examinations for reoccurring indications may be performed as noted.  Duplex Findings: +--------------------+--------+--------+------+--------+ Mesenteric          PSV cm/sEDV cm/sPlaqueComments +--------------------+--------+--------+------+--------+ Aorta at SMA  187                          +--------------------+--------+--------+------+--------+ Celiac Artery Origin  170                 stenotic +--------------------+--------+--------+------+--------+ SMA Origin            404      55                  +--------------------+--------+--------+------+--------+ SMA Proximal          375      35                   +--------------------+--------+--------+------+--------+    +------------------+--------+--------+-------+ Right Renal ArteryPSV cm/sEDV cm/sComment +------------------+--------+--------+-------+ Origin              103      15           +------------------+--------+--------+-------+ Proximal            102      12           +------------------+--------+--------+-------+ Mid                 102      20           +------------------+--------+--------+-------+ Distal               91      16           +------------------+--------+--------+-------+ +-----------------+--------+--------+-------+ Left Renal ArteryPSV cm/sEDV cm/sComment +-----------------+--------+--------+-------+ Origin              35      9            +-----------------+--------+--------+-------+ Proximal            54      4            +-----------------+--------+--------+-------+ Mid                 42      11           +-----------------+--------+--------+-------+ Distal              34      11           +-----------------+--------+--------+-------+ +------------+--------+--------+----+-----------+--------+--------+----+ Right KidneyPSV cm/sEDV cm/sRI  Left KidneyPSV cm/sEDV cm/sRI   +------------+--------+--------+----+-----------+--------+--------+----+ Upper Pole  22      10      0.54Upper Pole 14      3       0.80 +------------+--------+--------+----+-----------+--------+--------+----+ Mid         10      4       0.64Mid        54      11      0.79 +------------+--------+--------+----+-----------+--------+--------+----+ Lower Pole  8       4       0.55Lower Pole 28      6       0.78 +------------+--------+--------+----+-----------+--------+--------+----+ Hilar       44      6       0.86Hilar      37      10      0.73 +------------+--------+--------+----+-----------+--------+--------+----+ +------------------+-----+------------------+-----+ Right  Kidney           Left Kidney             +------------------+-----+------------------+-----+ RAR  RAR                     +------------------+-----+------------------+-----+ RAR (manual)      0.55 RAR (manual)      0.29  +------------------+-----+------------------+-----+ Cortex                 Cortex                  +------------------+-----+------------------+-----+ Cortex thickness       Corex thickness         +------------------+-----+------------------+-----+ Kidney length (cm)11.50Kidney length (cm)12.00 +------------------+-----+------------------+-----+  Summary: Renal:  Right: Normal size right kidney. Normal right Resisitive Index.        Cyst(s) noted. Left:  Abnormal left Resisitve Index. Normal size of left kidney.        Cyst(s) noted. Decreased velocities in the left renal artery        artery compared to the right. 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 Stenotic flow noted in the Celiac artery.  *See table(s) above for measurements and observations.  Diagnosing physician: Orlie Pollen  Electronically signed by Orlie Pollen on 09/14/2022 at 1:33:46 PM.    Final     Labs: BMET Recent Labs  Lab 09/12/22 1135 09/13/22 0214 09/14/22 0258 09/15/22 0314  NA 139 142 143 142  K 5.5* 4.8 4.1 3.9  CL 110 110 105 105  CO2 15* 17* 21* 24  GLUCOSE 122* 130* 85 101*  BUN 97* 96* 92* 87*  CREATININE 7.82* 7.85* 7.67* 7.30*  CALCIUM 8.2* 7.9* 7.8* 7.7*  PHOS  --  8.5* 7.8* 7.0*   CBC Recent Labs  Lab 09/12/22 1135 09/13/22 0214 09/14/22 0258 09/14/22 1814 09/15/22 0314  WBC 6.3 6.7 6.2  --  7.4  NEUTROABS 4.4 4.9  --   --   --   HGB 9.4* 8.7* 7.8* 9.0* 9.1*  HCT 30.2* 26.8* 23.5* 27.2* 26.3*  MCV 97.1 94.4 92.5  --  88.9  PLT 249 224 189  --  203    Medications:     sodium chloride   Intravenous Once   amLODipine  10 mg Oral QPM   atorvastatin  20 mg Oral Daily   Chlorhexidine Gluconate Cloth  6 each  Topical Daily   cholecalciferol  1,000 Units Oral Daily   darbepoetin (ARANESP) injection - NON-DIALYSIS  60 mcg Subcutaneous Q Sat-1800   heparin injection (subcutaneous)  5,000 Units Subcutaneous Q8H   hydrALAZINE  50 mg Oral BID   melatonin  5 mg Oral QHS   metoprolol succinate  50 mg Oral QPM   tamsulosin  0.8 mg Oral QHS    Madelon Lips MD 09/15/2022, 10:30 AM

## 2022-09-16 ENCOUNTER — Inpatient Hospital Stay (HOSPITAL_COMMUNITY): Payer: Medicare Other

## 2022-09-16 DIAGNOSIS — N179 Acute kidney failure, unspecified: Secondary | ICD-10-CM | POA: Diagnosis not present

## 2022-09-16 DIAGNOSIS — I10 Essential (primary) hypertension: Secondary | ICD-10-CM | POA: Diagnosis not present

## 2022-09-16 LAB — RENAL FUNCTION PANEL
Albumin: 2.6 g/dL — ABNORMAL LOW (ref 3.5–5.0)
Anion gap: 14 (ref 5–15)
BUN: 88 mg/dL — ABNORMAL HIGH (ref 8–23)
CO2: 27 mmol/L (ref 22–32)
Calcium: 7.8 mg/dL — ABNORMAL LOW (ref 8.9–10.3)
Chloride: 101 mmol/L (ref 98–111)
Creatinine, Ser: 6.92 mg/dL — ABNORMAL HIGH (ref 0.61–1.24)
GFR, Estimated: 7 mL/min — ABNORMAL LOW (ref >60)
Glucose, Bld: 115 mg/dL — ABNORMAL HIGH (ref 70–99)
Phosphorus: 7 mg/dL — ABNORMAL HIGH (ref 2.5–4.6)
Potassium: 3.8 mmol/L (ref 3.5–5.1)
Sodium: 142 mmol/L (ref 135–145)

## 2022-09-16 LAB — CBC
HCT: 27.7 % — ABNORMAL LOW (ref 39.0–52.0)
Hemoglobin: 9.2 g/dL — ABNORMAL LOW (ref 13.0–17.0)
MCH: 30.1 pg (ref 26.0–34.0)
MCHC: 33.2 g/dL (ref 30.0–36.0)
MCV: 90.5 fL (ref 80.0–100.0)
Platelets: 194 10*3/uL (ref 150–400)
RBC: 3.06 MIL/uL — ABNORMAL LOW (ref 4.22–5.81)
RDW: 15.9 % — ABNORMAL HIGH (ref 11.5–15.5)
WBC: 7.3 10*3/uL (ref 4.0–10.5)
nRBC: 0 % (ref 0.0–0.2)

## 2022-09-16 LAB — URINE CULTURE: Culture: >=100000 — AB

## 2022-09-16 LAB — BRAIN NATRIURETIC PEPTIDE: B Natriuretic Peptide: 1328.2 pg/mL — ABNORMAL HIGH (ref 0.0–100.0)

## 2022-09-16 LAB — PROTIME-INR
INR: 1.3 — ABNORMAL HIGH (ref 0.8–1.2)
Prothrombin Time: 15.9 seconds — ABNORMAL HIGH (ref 11.4–15.2)

## 2022-09-16 MED ORDER — FENTANYL CITRATE (PF) 100 MCG/2ML IJ SOLN
INTRAMUSCULAR | Status: AC | PRN
  Administered 2022-09-16 (×2): 25 ug via INTRAVENOUS

## 2022-09-16 MED ORDER — FUROSEMIDE 10 MG/ML IJ SOLN
40.0000 mg | Freq: Once | INTRAMUSCULAR | Status: AC
  Administered 2022-09-16: 40 mg via INTRAVENOUS
  Filled 2022-09-16: qty 4

## 2022-09-16 MED ORDER — DEXTROSE 5 % IV SOLN
500.0000 mg | INTRAVENOUS | Status: DC
  Administered 2022-09-16: 500 mg via INTRAVENOUS
  Filled 2022-09-16 (×2): qty 5

## 2022-09-16 MED ORDER — FENTANYL CITRATE (PF) 100 MCG/2ML IJ SOLN
INTRAMUSCULAR | Status: AC
  Filled 2022-09-16: qty 2

## 2022-09-16 MED ORDER — GELATIN ABSORBABLE 12-7 MM EX MISC
CUTANEOUS | Status: AC
  Filled 2022-09-16: qty 1

## 2022-09-16 MED ORDER — MIDAZOLAM HCL 2 MG/2ML IJ SOLN
INTRAMUSCULAR | Status: AC | PRN
  Administered 2022-09-16 (×3): .5 mg via INTRAVENOUS

## 2022-09-16 MED ORDER — LIDOCAINE HCL (PF) 1 % IJ SOLN
INTRAMUSCULAR | Status: AC
  Filled 2022-09-16: qty 30

## 2022-09-16 MED ORDER — MIDAZOLAM HCL 2 MG/2ML IJ SOLN
INTRAMUSCULAR | Status: AC
  Filled 2022-09-16: qty 2

## 2022-09-16 MED ORDER — LIDOCAINE HCL (PF) 1 % IJ SOLN: 10.0000 mL | Freq: Once | INTRAMUSCULAR | Status: DC

## 2022-09-16 MED ORDER — GELATIN ABSORBABLE 12-7 MM EX MISC
1.0000 | Freq: Once | CUTANEOUS | Status: DC
  Filled 2022-09-16: qty 1

## 2022-09-16 NOTE — Progress Notes (Signed)
Patient states that he does not want to remain in the hospital and wants to go home today.  Patient's SaO2 is 82 on 2 liters of O2 per cannula.  MD notified.

## 2022-09-16 NOTE — Progress Notes (Addendum)
[facility] Progress Note     Assessment/ Plan:    AKI/CKD stage IV - rapid decline in renal function after increasing losartan dose to 50 mg on 07/24/22.  Serology workup negative, renal US without hydronephrosis.  Will order renal artery duplex to r/o RAS (given long tobacco history).  Gentle IVF's overnight and follow UOP and renal function.  He has no uremic symptoms.  No urgent indication for dialysis, however we did discuss the possible need for RRT in the near future if he does not improve off of ARB.  Renal function slowly improving; cr low 3's 04/2022; followed by Dr. Harrie Jeans.  Avoid nephrotoxic medications including NSAIDs and iodinated intravenous contrast exposure unless the latter is absolutely indicated.   Preferred narcotic agents for pain control are hydromorphone, fentanyl, and methadone. Morphine should not be used.  Avoid Baclofen and avoid oral sodium phosphate and magnesium citrate based laxatives / bowel preps.  Continue strict Input and Output monitoring. Will monitor the patient closely with you and intervene or adjust therapy as indicated by changes in clinical status/labs  Pt initially unwilling to stay for workup, now is willing Getting renal artery duplex- RAS 11/4; appreciate VVS Dr. Virl Cagey reading. Abnormal RI and flows in the left side. Biopsy Monday-- requisition form in chart; high RI with decreased flows on duplex of the left side and intially I requested for bx of the right kidney but after d/w Dr. Kathlene Cote the left kidney has much greater volume than a small RK on ultrasound 09/11/2022. With that being the case I agree that a left kidney biopsy would have better yield. Ceftriaxone for UTI  Proteinuria - UPC 6862 mg/g on 06/14/22.  Will order 24 hour urine for protein.  Had negative SPEP and UPEP.  Biopsy Monday HTN - new home regimen amlodipine 10 mg daily, hydralazine 50 mg bid, metoprolol ER 50 mg daily.  Continue to hold losartan for now.   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 Anemia of CKD stage IV - Hgb has dropped from 12.2 on 07/23/22 to 9.4 today.  Will check iron stores and guaiac stool.  Getting 1 u pRBCs today, got aranesp Sat OSA - does not use CPAP  Subjective:   Denies f/c/n/v; somewhat more congested. Adamant about wanting to go home today. I explained that depends when the biopsy happens. His spouse bedside understood and I believe the pt does as well   Objective:   BP (!) 154/64 (BP Location: Left Arm)   Pulse 75   Temp 98.3 F (36.8 C)   Resp 19   Ht '5\' 10"'$  (1.778 m)   Wt 75.6 kg   SpO2 93%   BMI 23.91 kg/m   Intake/Output Summary (Last 24 hours) at 09/16/2022 9326 Last data filed at 09/16/2022 0600 Gross per 24 hour  Intake 2783.34 ml  Output 2050 ml  Net 733.34 ml   Weight change:   Physical Exam: [id number] in bed Resp:unlabored WOB Abd: + Foley in place, clear urine Ext: does not appear to be swelling Back: No flank tenderness CV: S1S2, not tachycardic  Imaging: VAS US RENAL ARTERY DUPLEX  Result Date: 09/14/2022 ABDOMINAL VISCERAL Patient Name:  DECLAN MIER  Date of Exam:   09/14/2022 Medical Rec #: [id number]      Accession #:    [id number] Date of Birth: 09-22-1941      Patient Gender: M Patient  Age:   9 years Exam Location:  [facility] Procedure:      VAS US RENAL ARTERY DUPLEX Referring Phys: [id number] -------------------------------------------------------------------------------- Indications: HTN High Risk Factors: Hypertension, hyperlipidemia. Limitations: Air/bowel gas and patient coughing. Performing Technologist: Archie Patten RVS  Examination Guidelines: A complete evaluation includes B-mode imaging, spectral Doppler, color Doppler, and power Doppler as needed of all accessible portions of each vessel. Bilateral testing is considered an integral part of  a complete examination. Limited examinations for reoccurring indications may be performed as noted.  Duplex Findings: +--------------------+--------+--------+------+--------+ Mesenteric          PSV cm/sEDV cm/sPlaqueComments +--------------------+--------+--------+------+--------+ Aorta at SMA          187                          +--------------------+--------+--------+------+--------+ Celiac Artery Origin  170                 stenotic +--------------------+--------+--------+------+--------+ SMA Origin            404      55                  +--------------------+--------+--------+------+--------+ SMA Proximal          375      35                  +--------------------+--------+--------+------+--------+    +------------------+--------+--------+-------+ Right Renal ArteryPSV cm/sEDV cm/sComment +------------------+--------+--------+-------+ Origin              103      15           +------------------+--------+--------+-------+ Proximal            102      12           +------------------+--------+--------+-------+ Mid                 102      20           +------------------+--------+--------+-------+ Distal               91      16           +------------------+--------+--------+-------+ +-----------------+--------+--------+-------+ Left Renal ArteryPSV cm/sEDV cm/sComment +-----------------+--------+--------+-------+ Origin              35      9            +-----------------+--------+--------+-------+ Proximal            54      4            +-----------------+--------+--------+-------+ Mid                 42      11           +-----------------+--------+--------+-------+ Distal              34      11           +-----------------+--------+--------+-------+ +------------+--------+--------+----+-----------+--------+--------+----+ Right KidneyPSV cm/sEDV cm/sRI  Left KidneyPSV cm/sEDV cm/sRI    +------------+--------+--------+----+-----------+--------+--------+----+ Upper Pole  22      10      0.54Upper Pole 14      3       0.80 +------------+--------+--------+----+-----------+--------+--------+----+ Mid         10      4       0.64Mid  54      11      0.79 +------------+--------+--------+----+-----------+--------+--------+----+ Lower Pole  8       4       0.55Lower Pole 28      6       0.78 +------------+--------+--------+----+-----------+--------+--------+----+ Hilar       44      6       0.86Hilar      37      10      0.73 +------------+--------+--------+----+-----------+--------+--------+----+ +------------------+-----+------------------+-----+ Right Kidney           Left Kidney             +------------------+-----+------------------+-----+ RAR                    RAR                     +------------------+-----+------------------+-----+ RAR (manual)      0.55 RAR (manual)      0.29  +------------------+-----+------------------+-----+ Cortex                 Cortex                  +------------------+-----+------------------+-----+ Cortex thickness       Corex thickness         +------------------+-----+------------------+-----+ Kidney length (cm)11.50Kidney length (cm)12.00 +------------------+-----+------------------+-----+  Summary: Renal:  Right: Normal size right kidney. Normal right Resisitive Index.        Cyst(s) noted. Left:  Abnormal left Resisitve Index. Normal size of left kidney.        Cyst(s) noted. Decreased velocities in the left renal artery        artery compared to the right. 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 Stenotic flow noted in the Celiac artery.  *See table(s) above for measurements and observations.  Diagnosing physician: Orlie Pollen  Electronically signed by Orlie Pollen on 09/14/2022 at 1:33:46 PM.    Final     Labs: BMET Recent Labs  Lab 09/12/22 1135  09/13/22 0214 09/14/22 0258 09/15/22 0314 09/16/22 0346  NA 139 142 143 142 142  K 5.5* 4.8 4.1 3.9 3.8  CL 110 110 105 105 101  CO2 15* 17* 21* 24 27  GLUCOSE 122* 130* 85 101* 115*  BUN 97* 96* 92* 87* 88*  CREATININE 7.82* 7.85* 7.67* 7.30* 6.92*  CALCIUM 8.2* 7.9* 7.8* 7.7* 7.8*  PHOS  --  8.5* 7.8* 7.0* 7.0*   CBC Recent Labs  Lab 09/12/22 1135 09/13/22 0214 09/14/22 0258 09/14/22 1814 09/15/22 0314 09/16/22 0346  WBC 6.3 6.7 6.2  --  7.4 7.3  NEUTROABS 4.4 4.9  --   --   --   --   HGB 9.4* 8.7* 7.8* 9.0* 9.1* 9.2*  HCT 30.2* 26.8* 23.5* 27.2* 26.3* 27.7*  MCV 97.1 94.4 92.5  --  88.9 90.5  PLT 249 224 189  --  203 194    Medications:     sodium chloride   Intravenous Once   amLODipine  10 mg Oral QPM   atorvastatin  20 mg Oral Daily   Chlorhexidine Gluconate Cloth  6 each Topical Daily   cholecalciferol  1,000 Units Oral Daily   darbepoetin (ARANESP) injection - NON-DIALYSIS  60 mcg Subcutaneous Q Sat-1800   heparin injection (subcutaneous)  5,000 Units Subcutaneous Q8H   hydrALAZINE  50 mg Oral TID   melatonin  5 mg Oral QHS   metoprolol  succinate  50 mg Oral QPM   tamsulosin  0.8 mg Oral QHS      Otelia Santee, MD 09/16/2022, 8:03 AM

## 2022-09-16 NOTE — Procedures (Signed)
Interventional Radiology Procedure Note  Date of Procedure: 09/16/2022  Procedure: [place] random renal biopsy   Findings:  1. [place] random renal biopsy right renal lower pole 18ga x2 passes    Complications: No immediate complications noted.   Estimated Blood Loss: minimal  Follow-up and Recommendations: 1. Bedrest 4 hours    Albin Felling, MD  Vascular & Interventional Radiology  09/16/2022 11:01 AM

## 2022-09-16 NOTE — Progress Notes (Incomplete)
Paged Dr. Irene Pap: pt has COPD, does not use O2 @ home. Has congestion in chest/cough ("chronic bronchitis" per MD notes). O2 earlier this evening was 88% on RA. Placed on 4LNC to get sat to 90%. (this happened in ED @ admission bc I was there the night he was admitted)...Marland KitchenMarland KitchenHeavy rhonchi earlier, got PRN breathing treatment. Pt always denies SOB. Post breathing treatment, RT noted O2 was mid 80's and placed pt on 6 L HFNC to get pt to decent sat. I just checked and pt is now 85/86% on 6L HFNC. Pt denies SOB and is AO x4.     09/16/22 0031  Vitals  Temp 98.4 F (36.9 C)  Temp Source Oral  BP (!) 148/52  MAP (mmHg) 79  BP Method Automatic  Pulse Rate 76  Pulse Rate Source Monitor  ECG Heart Rate 84  Resp 18  MEWS COLOR  MEWS Score Color Green  Oxygen Therapy  SpO2 (!) 86 %  O2 Device HFNC  O2 Flow Rate (L/min) 6 L/min

## 2022-09-16 NOTE — Progress Notes (Signed)
Mobility Specialist Progress Note:   09/16/22 1525  Mobility  Activity Ambulated with assistance in room  Level of Assistance Independent  Assistive Device None  Distance Ambulated (ft) 50 ft  Activity Response Tolerated well  Mobility Referral Yes  $Mobility charge 1 Mobility   Pt received in bed and agreeable. C/o general fatigue. Pt left in bed with all needs met, call bell in reach, and family in room.   Jeremy [name] Mobility Specialist-Acute Rehab Secure Chat only

## 2022-09-16 NOTE — TOC Initial Note (Signed)
Transition of Care [facility]) - Initial/Assessment Note    Patient Details  Name: Jeremy [name] MRN: [id number] Date of Birth: 05/16/1941  [facility]) CM/SW Contact:    Tom-Johnson, Renea Ee, RN Phone Number: 09/16/2022, 1:23 PM  Clinical Narrative:                  CM spoke with patient and wife, Jeremy [name] at beside about needs for post hospital transition. Patient is admitted for Acute kidney injury. Has hx of COPD and  on 4L O2, does not use home O2.  Had scheduled Renal Biopsy today. From home with wife, has two supportive sons who lives out of state. Does not have family closeby. 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 Has a shower seat at home, not other DME's noted.  PCP is Jeremy Nutting, DO and uses [facility].  No PT f/u noted.  CM will continue to follow as patient progresses with care towards discharge.     Expected Discharge Plan: Home/Self Care Barriers to Discharge: Continued Medical Work up   Patient Goals and CMS Choice Patient states their goals for this hospitalization and ongoing recovery are:: To return home CMS Medicare.gov Compare Post Acute Care list provided to:: Patient Choice offered to / list presented to : Patient, Spouse  Expected Discharge Plan and Services Expected Discharge Plan: Home/Self Care   Discharge Planning Services: CM Consult Post Acute Care Choice: NA Living arrangements for the past 2 months: Single Family Home                                      Prior Living Arrangements/Services Living arrangements for the past 2 months: Single Family Home Lives with:: Spouse Patient language and need for interpreter reviewed:: Yes Do you feel safe going back to the place where you live?: Yes          Current home services: DME (Shower seat) Criminal Activity/Legal Involvement Pertinent to Current Situation/Hospitalization: No - Comment as  needed  Activities of Daily Living Home Assistive Devices/Equipment: Hearing aid, Eyeglasses, Shower chair with back ADL Screening (condition at time of admission) Patient's cognitive ability adequate to safely complete daily activities?: Yes Is the patient deaf or have difficulty hearing?: Yes Does the patient have difficulty seeing, even when wearing glasses/contacts?: No Does the patient have difficulty concentrating, remembering, or making decisions?: No Patient able to express need for assistance with ADLs?: Yes Does the patient have difficulty dressing or bathing?: No Independently performs ADLs?: Yes (appropriate for developmental age) Does the patient have difficulty walking or climbing stairs?: No Weakness of Legs: None Weakness of Arms/Hands: None  Permission Sought/Granted Permission sought to share information with : Case Manager, Family Supports Permission granted to share information with : Yes, Verbal Permission Granted              Emotional Assessment Appearance:: Appears stated age Attitude/Demeanor/Rapport: Engaged, Gracious Affect (typically observed): Accepting, Appropriate, Calm, Hopeful, Pleasant Orientation: : Oriented to Self, Oriented to Place, Oriented to Situation Alcohol / Substance Use: Not Applicable Psych Involvement: No (comment)  Admission diagnosis:  AKI (acute kidney injury) ([place]) [N17.9] Patient Active Problem List   Diagnosis Date Noted   AKI (acute kidney injury) ([place]) 09/12/2022   Lethargy 08/01/2022   Neck pain on right side 04/23/2022   Cellulitis 01/08/2022  Intermediate stage nonexudative age-related macular degeneration of left eye 08/17/2020   Pseudophakia 08/17/2020   Degenerative retinal drusen of left eye 08/17/2020   Advanced nonexudative age-related macular degeneration of right eye with subfoveal involvement 08/17/2020   Senile purpura ([place]) 08/10/2020   Aortic atherosclerosis ([place]) 08/10/2020   Cerumen impaction  08/10/2020   Skin lesion 07/06/2019   Hyperlipidemia 01/05/2019   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   Cigarette smoker 03/17/2018   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   OSA (obstructive sleep apnea) 05/23/2016   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   Osteoarthritis 01/29/2010   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   History of bladder cancer 10/10/2008   Chronic bronchitis ([place]) 10/10/2008   Diverticulosis of large intestine 10/10/2008   COLONIC POLYPS 12/07/2007   Essential hypertension 12/07/2007   HEMORRHOIDS 12/07/2007   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   PCP:  Jeremy Nutting, DO Pharmacy:   [facility] [place], [place] - [address] [place] [address] Phone: [phone] Fax: [phone]  [facility] [place], [address] [place] [address] Phone: [phone] Fax: [phone]  [facility] [place], [place] - [phone] [address] [place] [address] Phone: [phone] Fax: [phone]  [place], [place] - [address] [place] [address] Phone: [phone] Fax: [id number] - [place], [address] AT [facility] [address] [place] [address] Phone: [phone] Fax: [phone]     Social Determinants of Health ([place]) Interventions    Readmission Risk Interventions     No data to display

## 2022-09-16 NOTE — Plan of Care (Signed)
  Problem: Activity: Goal: Risk for activity intolerance will decrease Outcome: Progressing   Problem: Nutrition: Goal: Adequate nutrition will be maintained Outcome: Progressing   Problem: Pain Managment: Goal: General experience of comfort will improve Outcome: Progressing   Problem: Safety: Goal: Ability to remain free from injury will improve Outcome: Progressing   Problem: Skin Integrity: Goal: Risk for impaired skin integrity will decrease Outcome: Progressing   

## 2022-09-16 NOTE — Care Management Important Message (Signed)
Important Message  Patient Details  Name: Jeremy [name] MRN: [id number] Date of Birth: [birth year]   Medicare Important Message Given:  Yes     Orbie Pyo 09/16/2022, 4:13 PM

## 2022-09-16 NOTE — Progress Notes (Signed)
PROGRESS NOTE    Jeremy [name]  MVH:[id number] DOB: [birth year] DOA: 09/12/2022 PCP: [name], DO   Chief Complaint  Patient presents with   abnormal labs    Brief Narrative:   Jeremy [name]  is a 81 y.o. male, with medical history significant of bladder cancer (s/p of excision ), self catheterization, BPH, OSA not on CPAP, hypertension, hyperlipidemia, CKD-4, who was sent by his nephrologist for evaluation of worsening renal function, and is following with Dr. Royce Macadamia, it was noted with increased serum creatinine over the last few weeks, patient on losartan for blood pressure, dose was increased on 07/24/2022, creatinine has been increased to trending up from baseline of 2.9, to 6 on 08/30/2022, he was seen again on 09/11/2022, for which his creatinine went up to 7.36, he was taken off losartan then, and started on hydralazine instead, renal ultrasound 09/11/2022 without evidence of hydronephrosis, patient was sent to ED for further evaluation given his worsening renal function, patient reports he self catheter times a day, he denies any fever, chills or any new complaints, he denies any new NSAIDs use, hematuria, pyuria, or worsening lower extremity edema, no dyspnea or orthopnea. -In ED blood work significant for creatinine of 7.8, potassium of 5.5, EKG still pending, pressure elevated 170/64, [facility] hospitalist consulted to admit.    Assessment & Plan:   Principal Problem:   AKI (acute kidney injury) ([place]) Active Problems:   History of bladder cancer   Essential hypertension   Chronic bronchitis (H[facility])   Benign prostatic hyperplasia with urinary obstruction   OSA (obstructive sleep apnea)   Cigarette smoker   Hyperlipidemia      AKI on CKD stage IV -Baseline creatinine 2.9, with rapid increase over the last few weeks, 7.8 on admission. -for  renal biopsy on Monday. -Continue with IV bicarb. -vascular Doppler no evidence of renal artery stenosis -Avoid nephrotoxic  medications. -24 hours urine collection showing 2.8 g / 24 hours of protein -Patient went for renal biopsy late morning  Acute hypoxic respiratory failure -patient was noted with increased oxygen requirement overnight, desaturating to 85%, he started on 6 L nasal cannula, but currently down to 4 L, he was encouraged to use incentive spirometry and flutter valve, will check stat x-ray.  Will add BNP to morning labs.      Hyperkalemia -Resolved, continue to monitor   Hypertension Blood pressure is elevated, continue with home regimen including Norvasc, hydralazine and metoprolol, will increase hydralazine for better blood pressure control.   -Continue to hold losartan - prn hydralazine    History of BPH/urinary retention -Patient does self cath at home, inserted t Foley catheter during hospital stay, continue with Flomax   OSA -Not use CPAP at home   Anemia of chronic kidney disease -Renal function at baseline -Iron panel per renal   Hyperlipidemia -Continue with statin   Chronic bronchitis -We will keep on as needed albuterol given some mild wheezing   Tobacco abuse - Continue with nicotine patch   -UTI  - urine  culture growing E. coli, continue with IV Rocephin   Anemia of chronic kidney disease -received  1 unit PRBC and Procrit     DVT prophylaxis: heparin Code Status: Full Family Communication: Discussed with wife at bedside today Disposition:   Status is: Inpatient    Consultants:  Renal IR  Procedures -renal biopsy by IR    Subjective:  Patient denies any complaints today, he was started on oxygen overnight   Objective: Vitals:  09/16/22 1015 09/16/22 1020 09/16/22 1035 09/16/22 1056  BP: 138/62 (!) 129/52 (!) 129/56 130/62  Pulse: 75 65 75 73  Resp: '19 19 19   '$ Temp:    (!) 97.5 F (36.4 C)  TempSrc:    Oral  SpO2: 94% 94% 94% 90%  Weight:      Height:        Intake/Output Summary (Last 24 hours) at 09/16/2022 1316 Last data filed  at 09/16/2022 0900 Gross per 24 hour  Intake 2423.34 ml  Output 1400 ml  Net 1023.34 ml   Filed Weights   09/12/22 1125 09/12/22 2012  Weight: 73.9 kg 75.6 kg    Examination:  Awake Alert, Oriented X 3, No new F.N deficits, Normal affect Symmetrical Chest wall movement, scattered Rales RRR,No Gallops,Rubs or new Murmurs, No Parasternal Heave +ve B.Sounds, Abd Soft, No tenderness, No rebound - guarding or rigidity. No Cyanosis, Clubbing or edema, No new Rash or bruise        Data Reviewed: I have personally reviewed following labs and imaging studies  CBC: Recent Labs  Lab 09/12/22 1135 09/13/22 0214 09/14/22 0258 09/14/22 1814 09/15/22 0314 09/16/22 0346  WBC 6.3 6.7 6.2  --  7.4 7.3  NEUTROABS 4.4 4.9  --   --   --   --   HGB 9.4* 8.7* 7.8* 9.0* 9.1* 9.2*  HCT 30.2* 26.8* 23.5* 27.2* 26.3* 27.7*  MCV 97.1 94.4 92.5  --  88.9 90.5  PLT 249 224 189  --  203 716    Basic Metabolic Panel: Recent Labs  Lab 09/12/22 1135 09/13/22 0214 09/14/22 0258 09/15/22 0314 09/16/22 0346  NA 139 142 143 142 142  K 5.5* 4.8 4.1 3.9 3.8  CL 110 110 105 105 101  CO2 15* 17* 21* 24 27  GLUCOSE 122* 130* 85 101* 115*  BUN 97* 96* 92* 87* 88*  CREATININE 7.82* 7.85* 7.67* 7.30* 6.92*  CALCIUM 8.2* 7.9* 7.8* 7.7* 7.8*  PHOS  --  8.5* 7.8* 7.0* 7.0*    GFR: Estimated Creatinine Clearance: 8.5 mL/min (A) (by C-G formula based on SCr of 6.92 mg/dL (H)).  Liver Function Tests: Recent Labs  Lab 09/13/22 0214 09/14/22 0258 09/15/22 0314 09/16/22 0346  ALBUMIN 2.7* 2.6* 2.6* 2.6*    CBG: No results for input(s): "GLUCAP" in the last 168 hours.   Recent Results (from the past 240 hour(s))  Urine Culture     Status: Abnormal   Collection Time: 09/12/22  5:23 PM   Specimen: Urine, Clean Catch  Result Value Ref Range Status   Specimen Description URINE, CLEAN CATCH  Final   Special Requests   Final    NONE Performed at [facility], [address],  [place], [place] [address]    Culture (A)  Final    >=100,000 COLONIES/mL ESCHERICHIA COLI >=100,000 COLONIES/mL ENTEROBACTER CLOACAE    Report Status 09/16/2022 FINAL  Final   Organism ID, Bacteria ESCHERICHIA COLI (A)  Final   Organism ID, Bacteria ENTEROBACTER CLOACAE (A)  Final      Susceptibility   Enterobacter cloacae - MIC*    CEFAZOLIN >=64 RESISTANT Resistant     CEFEPIME <=0.12 SENSITIVE Sensitive     CIPROFLOXACIN <=0.25 SENSITIVE Sensitive     GENTAMICIN <=1 SENSITIVE Sensitive     IMIPENEM <=0.25 SENSITIVE Sensitive     NITROFURANTOIN 32 SENSITIVE Sensitive     TRIMETH/SULFA <=20 SENSITIVE Sensitive     PIP/TAZO 8 SENSITIVE Sensitive     * >=  100,000 COLONIES/mL ENTEROBACTER CLOACAE   Escherichia coli - MIC*    AMPICILLIN >=32 RESISTANT Resistant     CEFAZOLIN <=4 SENSITIVE Sensitive     CEFEPIME <=0.12 SENSITIVE Sensitive     CEFTRIAXONE <=0.25 SENSITIVE Sensitive     CIPROFLOXACIN >=4 RESISTANT Resistant     GENTAMICIN <=1 SENSITIVE Sensitive     IMIPENEM <=0.25 SENSITIVE Sensitive     NITROFURANTOIN <=16 SENSITIVE Sensitive     TRIMETH/SULFA <=20 SENSITIVE Sensitive     AMPICILLIN/SULBACTAM 4 SENSITIVE Sensitive     PIP/TAZO <=4 SENSITIVE Sensitive     * >=100,000 COLONIES/mL ESCHERICHIA COLI         Radiology Studies: No results found.      Scheduled Meds:  sodium chloride   Intravenous Once   amLODipine  10 mg Oral QPM   atorvastatin  20 mg Oral Daily   Chlorhexidine Gluconate Cloth  6 each Topical Daily   cholecalciferol  1,000 Units Oral Daily   darbepoetin (ARANESP) injection - NON-DIALYSIS  60 mcg Subcutaneous Q Sat-1800   gelatin adsorbable  1 each Topical Once   heparin injection (subcutaneous)  5,000 Units Subcutaneous Q8H   hydrALAZINE  50 mg Oral TID   lidocaine (PF)  10 mL Intradermal Once   melatonin  5 mg Oral QHS   metoprolol succinate  50 mg Oral QPM   tamsulosin  0.8 mg Oral QHS   Continuous Infusions:  cefTRIAXone (ROCEPHIN)  IV  1 g (09/15/22 1746)   sodium bicarbonate 150 mEq in dextrose 5 % 1,150 mL infusion 75 mL/hr at 09/16/22 0525     LOS: 4 days       Phillips Climes, MD [facility]   To contact the attending provider between 7A-7P or the covering provider during after hours 7P-7A, please log into the web site [email or web address] and access using universal [place] password for that web site. If you do not have the password, please call the hospital operator.  09/16/2022, 1:16 PM

## 2022-09-17 ENCOUNTER — Other Ambulatory Visit (HOSPITAL_COMMUNITY): Payer: Self-pay

## 2022-09-17 DIAGNOSIS — I10 Essential (primary) hypertension: Secondary | ICD-10-CM | POA: Diagnosis not present

## 2022-09-17 DIAGNOSIS — N179 Acute kidney failure, unspecified: Secondary | ICD-10-CM | POA: Diagnosis not present

## 2022-09-17 DIAGNOSIS — N401 Enlarged prostate with lower urinary tract symptoms: Secondary | ICD-10-CM | POA: Diagnosis not present

## 2022-09-17 DIAGNOSIS — J411 Mucopurulent chronic bronchitis: Secondary | ICD-10-CM | POA: Diagnosis not present

## 2022-09-17 LAB — RENAL FUNCTION PANEL
Albumin: 2.5 g/dL — ABNORMAL LOW (ref 3.5–5.0)
Anion gap: 15 (ref 5–15)
BUN: 91 mg/dL — ABNORMAL HIGH (ref 8–23)
CO2: 27 mmol/L (ref 22–32)
Calcium: 7.8 mg/dL — ABNORMAL LOW (ref 8.9–10.3)
Chloride: 97 mmol/L — ABNORMAL LOW (ref 98–111)
Creatinine, Ser: 7.13 mg/dL — ABNORMAL HIGH (ref 0.61–1.24)
GFR, Estimated: 7 mL/min — ABNORMAL LOW (ref >60)
Glucose, Bld: 96 mg/dL (ref 70–99)
Phosphorus: 7.7 mg/dL — ABNORMAL HIGH (ref 2.5–4.6)
Potassium: 3.8 mmol/L (ref 3.5–5.1)
Sodium: 139 mmol/L (ref 135–145)

## 2022-09-17 LAB — CBC
HCT: 29 % — ABNORMAL LOW (ref 39.0–52.0)
Hemoglobin: 9.3 g/dL — ABNORMAL LOW (ref 13.0–17.0)
MCH: 29.7 pg (ref 26.0–34.0)
MCHC: 32.1 g/dL (ref 30.0–36.0)
MCV: 92.7 fL (ref 80.0–100.0)
Platelets: 196 10*3/uL (ref 150–400)
RBC: 3.13 MIL/uL — ABNORMAL LOW (ref 4.22–5.81)
RDW: 15.6 % — ABNORMAL HIGH (ref 11.5–15.5)
WBC: 7.4 10*3/uL (ref 4.0–10.5)
nRBC: 0 % (ref 0.0–0.2)

## 2022-09-17 MED ORDER — FUROSEMIDE 40 MG PO TABS
ORAL_TABLET | ORAL | 0 refills | Status: DC
  Filled 2022-09-17: qty 15, 12d supply, fill #0

## 2022-09-17 MED ORDER — CEFDINIR 300 MG PO CAPS
300.0000 mg | ORAL_CAPSULE | Freq: Every day | ORAL | 0 refills | Status: DC
  Filled 2022-09-17: qty 5, 5d supply, fill #0

## 2022-09-17 MED ORDER — HYDRALAZINE HCL 50 MG PO TABS
50.0000 mg | ORAL_TABLET | Freq: Three times a day (TID) | ORAL | 0 refills | Status: DC
  Filled 2022-09-17: qty 90, 30d supply, fill #0

## 2022-09-17 MED ORDER — FUROSEMIDE 40 MG PO TABS
ORAL_TABLET | ORAL | 0 refills | Status: DC
  Filled 2022-09-17: qty 15, 9d supply, fill #0

## 2022-09-17 MED ORDER — ALBUTEROL SULFATE (2.5 MG/3ML) 0.083% IN NEBU
2.5000 mg | INHALATION_SOLUTION | RESPIRATORY_TRACT | 12 refills | Status: DC | PRN
  Filled 2022-09-17: qty 90, 5d supply, fill #0

## 2022-09-17 MED ORDER — AZITHROMYCIN 500 MG PO TABS
500.0000 mg | ORAL_TABLET | Freq: Every day | ORAL | 0 refills | Status: AC
  Filled 2022-09-17: qty 3, 3d supply, fill #0

## 2022-09-17 MED ORDER — FUROSEMIDE 10 MG/ML IJ SOLN
60.0000 mg | Freq: Once | INTRAMUSCULAR | Status: AC
  Administered 2022-09-17: 60 mg via INTRAVENOUS
  Filled 2022-09-17: qty 6

## 2022-09-17 MED ORDER — DEXTROSE 5 % IV SOLN
500.0000 mg | INTRAVENOUS | Status: DC
  Administered 2022-09-17: 500 mg via INTRAVENOUS
  Filled 2022-09-17: qty 5

## 2022-09-17 MED ORDER — METHYLPREDNISOLONE 4 MG PO TBPK
ORAL_TABLET | ORAL | 0 refills | Status: DC
  Filled 2022-09-17: qty 21, 6d supply, fill #0

## 2022-09-17 MED ORDER — AZITHROMYCIN 500 MG PO TABS
500.0000 mg | ORAL_TABLET | Freq: Every day | ORAL | 0 refills | Status: DC
  Filled 2022-09-17: qty 5, 5d supply, fill #0

## 2022-09-17 NOTE — Plan of Care (Signed)
  Problem: Health Behavior/Discharge Planning: Goal: Ability to manage health-related needs will improve Outcome: Progressing   Problem: Clinical Measurements: Goal: Ability to maintain clinical measurements within normal limits will improve Outcome: Progressing   Problem: Activity: Goal: Risk for activity intolerance will decrease Outcome: Progressing   Problem: Nutrition: Goal: Adequate nutrition will be maintained Outcome: Progressing   Problem: Elimination: Goal: Will not experience complications related to bowel motility Outcome: Progressing Goal: Will not experience complications related to urinary retention Outcome: Progressing   Problem: Pain Managment: Goal: General experience of comfort will improve Outcome: Progressing   Problem: Safety: Goal: Ability to remain free from injury will improve Outcome: Progressing

## 2022-09-17 NOTE — Progress Notes (Signed)
DISCHARGE NOTE HOME [facility] [place] to be discharged Home per MD order. Discussed prescriptions and follow up appointments with the patient. Prescriptions given to patient; medication list explained in detail. Patient verbalized understanding.  Skin clean, dry and intact without evidence of skin break down, no evidence of skin tears noted. IV catheter discontinued intact. Site without signs and symptoms of complications. Dressing and pressure applied. Pt denies pain at the site currently. No complaints noted.  Patient free of lines, drains, and wounds.   An After Visit Summary (AVS) was printed and given to the patient. Patient escorted via wheelchair, and discharged home via private auto.  Arlyss Repress, RN

## 2022-09-17 NOTE — Progress Notes (Signed)
Mobility Specialist Progress Note:   09/17/22 1035  Mobility  Activity Ambulated with assistance in hallway  Level of Assistance Moderate assist, patient does 50-74%  Assistive Device None  Distance Ambulated (ft) 200 ft  Activity Response Tolerated well  Mobility Referral Yes  $Mobility charge 1 Mobility   Pt agreeable to mobility session. Required up to modA to steady with no AD use. Pt required up to 6LO2 to maintain SpO2 88-90%. Left sitting EOB with all needs met, wife in room.  Nelta Numbers Acute Rehab Secure Chat or Office Phone: [phone]

## 2022-09-17 NOTE — TOC Transition Note (Addendum)
Transition of Care [facility]) - CM/SW Discharge Note   Patient Details  Name: Jeremy [name] MRN: [id number] Date of Birth: Dec 03, 1940  [facility] CM/SW Contact:  Tom-Johnson, Renea Ee, RN Phone Number: 09/17/2022, 1:24 PM   Clinical Narrative:     Patient is scheduled for discharge today. Home Oxygen, Nebulizer Machine and Conservation officer, nature ordered from [employer] per patient and wife's request. Equipments to be delivered to patient at bedside prior to discharge. No Other needs noted. Wife to transport at discharge. No further TOC needs noted.    Final next level of care: Home/Self Care Barriers to Discharge: Barriers Resolved   Patient Goals and CMS Choice Patient states their goals for this hospitalization and ongoing recovery are:: To return home CMS Medicare.gov Compare Post Acute Care list provided to:: Patient Choice offered to / list presented to : Patient, Spouse  Discharge Placement                Patient to be transferred to facility by: Wife      Discharge Plan and Services   Discharge Planning Services: CM Consult Post Acute Care Choice: NA          DME Arranged: Oxygen, Walker rolling DME Agency: [employer] Date DME Agency Contacted: 09/17/22 Time DME Agency Contacted: 1250 Representative spoke with at DME Agency: Jodell Cipro and Erasmo Downer HH Arranged: NA [place] Agency: NA        Social Determinants of Health ([place]) Interventions     Readmission Risk Interventions     No data to display

## 2022-09-17 NOTE — Progress Notes (Signed)
Patient O2 stats while resting in the bed O2 2l increased to 4 IS demonstration showed that he was not using correctly 1000 reached his goal had him to cough and O2 stat increased to 88% on 4L  while in the bed.

## 2022-09-17 NOTE — Discharge Instructions (Signed)
Follow with Primary MD [name], DO in 7 days   Get CBC, CMP, 2 view Chest X ray checked  by Primary MD next visit.    Activity: As tolerated with Full fall precautions use walker/cane & assistance as needed   Disposition Home    Diet: Heart Healthy /renal modified with 1500 cc fluid restrictions  For Heart failure patients - Check your Weight same time everyday, if you gain over 2 pounds, or you develop in leg swelling, experience more shortness of breath or chest pain, call your Primary MD immediately. Follow Cardiac Low Salt Diet and 1.5 lit/day fluid restriction.   On your next visit with your primary care physician please Get Medicines reviewed and adjusted.   Please request your Prim.MD to go over all Hospital Tests and Procedure/Radiological results at the follow up, please get all Hospital records sent to your Prim MD by signing hospital release before you go home.   If you experience worsening of your admission symptoms, develop shortness of breath, life threatening emergency, suicidal or homicidal thoughts you must seek medical attention immediately by calling 911 or calling your MD immediately  if symptoms less severe.  You Must read complete instructions/literature along with all the possible adverse reactions/side effects for all the Medicines you take and that have been prescribed to you. Take any new Medicines after you have completely understood and accpet all the possible adverse reactions/side effects.   Do not drive, operating heavy machinery, perform activities at heights, swimming or participation in water activities or provide baby sitting services if your were admitted for syncope or siezures until you have seen by Primary MD or a Neurologist and advised to do so again.  Do not drive when taking Pain medications.    Do not take more than prescribed Pain, Sleep and Anxiety Medications  Special Instructions: If you have smoked or chewed Tobacco  in the last  2 yrs please stop smoking, stop any regular Alcohol  and or any Recreational drug use.  Wear Seat belts while driving.   Please note  You were cared for by a hospitalist during your hospital stay. If you have any questions about your discharge medications or the care you received while you were in the hospital after you are discharged, you can call the unit and asked to speak with the hospitalist on call if the hospitalist that took care of you is not available. Once you are discharged, your primary care physician will handle any further medical issues. Please note that NO REFILLS for any discharge medications will be authorized once you are discharged, as it is imperative that you return to your primary care physician (or establish a relationship with a primary care physician if you do not have one) for your aftercare needs so that they can reassess your need for medications and monitor your lab values.

## 2022-09-17 NOTE — Progress Notes (Signed)
SATURATION QUALIFICATIONS: (This note is used to comply with regulatory documentation for home oxygen)  Patient Saturations on Room Air at Rest = 87%  Patient Saturations on Room Air while Ambulating = N/A%  Patient Saturations on 6 Liters of oxygen while Ambulating = 88%-90%   Nelta Numbers Acute Programme researcher, broadcasting/film/video or Office Phone: [phone]

## 2022-09-17 NOTE — Discharge Summary (Addendum)
Physician Discharge Summary  Jeremy [name] ZOX:[id number] DOB: May 01, 1941 DOA: 09/12/2022  PCP: [name], DO  Admit date: 09/12/2022 Discharge date: 09/17/2022  Admitted From: (Home) Disposition:  (Home)  Recommendations for Outpatient Follow-up:  Follow up with PCP in 1-2 weeks Please obtain BMP/CBC in one week Please repeat  chest x-ray during next visit  Home Health: (NO) Equipment/Devices: (oxygen and neb machine)  Discharge Condition: Stable CODE STATUS:FULL Diet recommendation: Heart Healthy /renal 1500 cc fluid restriction  Brief/Interim Summary:  Jeremy [name]  is a 81 y.o. male, with medical history significant of bladder cancer (s/p of excision ), self catheterization, BPH, OSA not on CPAP, hypertension, hyperlipidemia, CKD-4, who was sent by his nephrologist for evaluation of worsening renal function, and is following with Dr. Royce Macadamia, it was noted with increased serum creatinine over the last few weeks, patient on losartan for blood pressure, dose was increased on 07/24/2022, creatinine has been increased to trending up from baseline of 2.9, to 6 on 08/30/2022, he was seen again on 09/11/2022, for which his creatinine went up to 7.36, he was taken off losartan then, and started on hydralazine instead, renal ultrasound 09/11/2022 without evidence of hydronephrosis, patient was sent to ED for further evaluation given his worsening renal function, patient reports he self catheter times a day, he denies any fever, chills or any new complaints, he denies any new NSAIDs use, hematuria, pyuria, or worsening lower extremity edema, no dyspnea or orthopnea. -In ED blood work significant for creatinine of 7.8, potassium of 5.5, patient admitted for further work-up.      AKI on CKD stage IV -Baseline creatinine 2.9, with rapid increase over the last few weeks, 7.8 on admission.  Seen by nephrology, initially he was kept on IV bicarb drip, which was later discontinued once bicarb has  improved, he went for renal biopsy by IR on Monday 11/6,Creatinine 7.13 on discharge, BUN is 91, no evidence of uremia, bicarb is 27. -vascular Doppler no evidence of renal artery stenosis -Avoid nephrotoxic medications. -24 hours urine collection showing 2.8 g / 24 hours of protein -Patient cleared for discharge by renal, to follow with Dr. Royce Macadamia as an outpatient  Acute hypoxic respiratory failure  COPD exacerbation -Patient with new oxygen requirement during hospital stay, significantly elevated BNP at 1328, IV fluid has been discontinued, started on IV Lasix with improvement of symptoms, he will be discharged on Lasix 40 mg oral twice daily, then 40 once daily- -Home oxygen will be arranged on discharge -He will be discharged on  nebs and steroid taper -Encouraged use incentive spirometry and flutter valve  -With phlegm secretions, yellow in color, he will be discharged on oral cefdinir and azithromycin x5 days.   Hyperkalemia -Resolved,   Hypertension Blood pressure is elevated, continue with home regimen including Norvasc, hydralazine and metoprolol,  increased hydralazine to TID  for better blood pressure control.   -Continue to hold losartan    History of BPH/urinary retention -Patient does self cath at home, inserted  Foley catheter during hospital stay, continue with Flomax -foley was discontinued prior to discharge   OSA -Not use CPAP at home   Anemia of chronic kidney disease -Renal function at baseline -He received  1 unit PRBC during hospital stay   Hyperlipidemia -Continue with statin   Tobacco abuse -Counseled   UTI  -treated  with IV Rocephin, transition to IV cefepime, discussed with ID, no further antibiotic needed at time of discharge     Discharge Diagnoses:  Principal  Problem:   AKI (acute kidney injury) ([place]) Active Problems:   History of bladder cancer   Essential hypertension   Chronic bronchitis (HCC)   Benign prostatic hyperplasia with  urinary obstruction   OSA (obstructive sleep apnea)   Cigarette smoker   Hyperlipidemia    Discharge Instructions  Discharge Instructions     Diet - low sodium heart healthy   Complete by: As directed    Discharge instructions   Complete by: As directed    Follow with Primary MD [name], DO in 7 days   Get CBC, CMP, 2 view Chest X ray checked  by Primary MD next visit.    Activity: As tolerated with Full fall precautions use walker/cane & assistance as needed   Disposition Home    Diet: Heart Healthy /renal modified with 1500 cc fluid restrictions  For Heart failure patients - Check your Weight same time everyday, if you gain over 2 pounds, or you develop in leg swelling, experience more shortness of breath or chest pain, call your Primary MD immediately. Follow Cardiac Low Salt Diet and 1.5 lit/day fluid restriction.   On your next visit with your primary care physician please Get Medicines reviewed and adjusted.   Please request your Prim.MD to go over all Hospital Tests and Procedure/Radiological results at the follow up, please get all Hospital records sent to your Prim MD by signing hospital release before you go home.   If you experience worsening of your admission symptoms, develop shortness of breath, life threatening emergency, suicidal or homicidal thoughts you must seek medical attention immediately by calling 911 or calling your MD immediately  if symptoms less severe.  You Must read complete instructions/literature along with all the possible adverse reactions/side effects for all the Medicines you take and that have been prescribed to you. Take any new Medicines after you have completely understood and accpet all the possible adverse reactions/side effects.   Do not drive, operating heavy machinery, perform activities at heights, swimming or participation in water activities or provide baby sitting services if your were admitted for syncope or siezures  until you have seen by Primary MD or a Neurologist and advised to do so again.  Do not drive when taking Pain medications.    Do not take more than prescribed Pain, Sleep and Anxiety Medications  Special Instructions: If you have smoked or chewed Tobacco  in the last 2 yrs please stop smoking, stop any regular Alcohol  and or any Recreational drug use.  Wear Seat belts while driving.   Please note  You were cared for by a hospitalist during your hospital stay. If you have any questions about your discharge medications or the care you received while you were in the hospital after you are discharged, you can call the unit and asked to speak with the hospitalist on call if the hospitalist that took care of you is not available. Once you are discharged, your primary care physician will handle any further medical issues. Please note that NO REFILLS for any discharge medications will be authorized once you are discharged, as it is imperative that you return to your primary care physician (or establish a relationship with a primary care physician if you do not have one) for your aftercare needs so that they can reassess your need for medications and monitor your lab values.   Increase activity slowly   Complete by: As directed    No wound care   Complete by: As directed  Allergies as of 09/17/2022       Reactions   Sulfa Antibiotics    Rash, light headed, shaking   Penicillins Other (See Comments)   hives severe as a child        Medication List     STOP taking these medications    acetaminophen 500 MG tablet Commonly known as: TYLENOL   cephALEXin 500 MG capsule Commonly known as: KEFLEX   losartan 100 MG tablet Commonly known as: COZAAR       TAKE these medications    albuterol (2.5 MG/3ML) 0.083% nebulizer solution Commonly known as: PROVENTIL Inhale 3 mLs (2.5 mg total) by nebulization every 4 (four) hours as needed for wheezing or shortness of breath.    amLODipine 10 MG tablet Commonly known as: NORVASC TAKE 1 TABLET EVERY DAY What changed: when to take this   cholecalciferol 25 MCG (1000 UNIT) tablet Commonly known as: VITAMIN D3 Take 1,000 Units by mouth daily.   diphenhydrAMINE-zinc acetate cream Commonly known as: BENADRYL Apply 1 Application topically daily as needed for itching.   furosemide 40 MG tablet Commonly known as: Lasix Take 1 tablet (40 mg) by mouth twice daily  for 3 days, then ( 40 mg ) 1 tablet daily   hydrALAZINE 50 MG tablet Commonly known as: APRESOLINE Take 1 tablet (50 mg total) by mouth 3 (three) times daily. What changed: when to take this   METAMUCIL FIBER PO Take 2 capsules by mouth every evening.   methylPREDNISolone 4 MG Tbpk tablet Commonly known as: MEDROL DOSEPAK Taper as directed on packaging.   metoprolol succinate 50 MG 24 hr tablet Commonly known as: TOPROL-XL TAKE 1 TABLET DAILY WITH OR IMMEDIATELY FOLLOWING A MEAL What changed: See the new instructions.   Mucinex 600 MG 12 hr tablet Generic drug: guaiFENesin Take 1,200 mg by mouth 2 (two) times daily as needed for cough.   olopatadine 0.1 % ophthalmic solution Commonly known as: PATANOL Place 1 drop into both eyes daily as needed for dry eyes.   simvastatin 40 MG tablet Commonly known as: ZOCOR TAKE 1 TABLET EVERY DAY   tamsulosin 0.4 MG Caps capsule Commonly known as: FLOMAX Take 0.8 mg by mouth at bedtime.   VICKS VAPORUB EX Apply 1 application topically at bedtime as needed (congestion).               Durable Medical Equipment  (From admission, onward)           Start     Ordered   09/17/22 1324  For home use only DME Walker rolling  Once       Question Answer Comment  Walker: With 5 Inch Wheels   Patient needs a walker to treat with the following condition Gait instability      09/17/22 1323   09/17/22 0936  For home use only DME oxygen  Once       Question Answer Comment  Length of Need 6  Months   Mode or (Route) Nasal cannula   Liters per Minute 2   Frequency Continuous (stationary and portable oxygen unit needed)   Oxygen conserving device Yes   Oxygen delivery system Gas      09/17/22 0935            Allergies  Allergen Reactions   Sulfa Antibiotics     Rash, light headed, shaking   Penicillins Other (See Comments)    hives severe as a child     Consultations: renal  Procedures/Studies: DG Chest Port 1 View  Result Date: 09/16/2022 CLINICAL DATA:  Hypoxia. EXAM: PORTABLE CHEST 1 VIEW COMPARISON:  April 23, 2022. FINDINGS: Stable cardiomediastinal silhouette. Increased bibasilar opacities are noted concerning for worsening edema or infiltrates. No pneumothorax is noted. Bony thorax is unremarkable. IMPRESSION: Increased bibasilar opacities are noted concerning for worsening edema or infiltrates. Aortic Atherosclerosis (ICD10-I70.0). Electronically Signed   By: Marijo Conception M.D.   On: 09/16/2022 16:03   US BIOPSY (KIDNEY)  Result Date: 09/16/2022 INDICATION: Proteinuria, AKI EXAM: Ultrasound-guided random renal biopsy MEDICATIONS: None. ANESTHESIA/SEDATION: Moderate (conscious) sedation was employed during this procedure. A total of Versed 1 mg and Fentanyl 50 mcg was administered intravenously by the radiology nurse. Total intra-service moderate Sedation Time: 13 minutes. The patient's level of consciousness and vital signs were monitored continuously by radiology nursing throughout the procedure under my direct supervision. COMPLICATIONS: None immediate. PROCEDURE: Informed written consent was obtained from the patient after a thorough discussion of the procedural risks, benefits and alternatives. All questions were addressed. Maximal Sterile Barrier Technique was utilized including caps, mask, sterile gowns, sterile gloves, sterile drape, hand hygiene and skin antiseptic. A timeout was performed prior to the initiation of the procedure. The patient was placed  prone on the exam table. Limited ultrasound of the bilateral flanks was performed for planning purposes. The right renal lower pole was selected as appropriate for percutaneous biopsy access. Skin entry site was marked, and the overlying skin was prepped and draped in the standard sterile fashion. Local analgesia was obtained with 1% lidocaine. Using ultrasound guidance, a 17 gauge introducer needle was advanced just deep to the cortex of the lower pole. Subsequently, core needle biopsy was performed using a 18 gauge core biopsy device x2 total passes. Specimens were submitted in saline to pathology for further handling. Gel-Foam slurry was administered through the introducer needle as it was removed to assist with hemostasis. Limited postprocedure imaging demonstrated expected post biopsy changes without hematoma. A clean dressing was placed. The patient tolerated the procedure well without immediate complication. IMPRESSION: Successful ultrasound-guided random renal biopsy of the right renal lower pole. Electronically Signed   By: Albin Felling M.D.   On: 09/16/2022 14:24   VAS US RENAL ARTERY DUPLEX  Result Date: 09/14/2022 ABDOMINAL VISCERAL Patient Name:  Jeremy [name]  Date of Exam:   09/14/2022 Medical Rec #: [id number]      Accession #:    [id number] Date of Birth: 1941/10/30      Patient Gender: M Patient Age:   67 years Exam Location:  [facility] Procedure:      VAS US RENAL ARTERY DUPLEX Referring Phys: [id number] Anvita Hirata S Kateryn Marasigan -------------------------------------------------------------------------------- Indications: HTN High Risk Factors: Hypertension, hyperlipidemia. Limitations: Air/bowel gas and patient coughing. Performing Technologist: Archie Patten RVS  Examination Guidelines: A complete evaluation includes B-mode imaging, spectral Doppler, color Doppler, and power Doppler as needed of all accessible portions of each vessel. Bilateral testing is considered an integral part of a  complete examination. Limited examinations for reoccurring indications may be performed as noted.  Duplex Findings: +--------------------+--------+--------+------+--------+ Mesenteric          PSV cm/sEDV cm/sPlaqueComments +--------------------+--------+--------+------+--------+ Aorta at SMA          187                          +--------------------+--------+--------+------+--------+ Celiac Artery Origin  170  stenotic +--------------------+--------+--------+------+--------+ SMA Origin            404      55                  +--------------------+--------+--------+------+--------+ SMA Proximal          375      35                  +--------------------+--------+--------+------+--------+    +------------------+--------+--------+-------+ Right Renal ArteryPSV cm/sEDV cm/sComment +------------------+--------+--------+-------+ Origin              103      15           +------------------+--------+--------+-------+ Proximal            102      12           +------------------+--------+--------+-------+ Mid                 102      20           +------------------+--------+--------+-------+ Distal               91      16           +------------------+--------+--------+-------+ +-----------------+--------+--------+-------+ Left Renal ArteryPSV cm/sEDV cm/sComment +-----------------+--------+--------+-------+ Origin              35      9            +-----------------+--------+--------+-------+ Proximal            54      4            +-----------------+--------+--------+-------+ Mid                 42      11           +-----------------+--------+--------+-------+ Distal              34      11           +-----------------+--------+--------+-------+ +------------+--------+--------+----+-----------+--------+--------+----+ Right KidneyPSV cm/sEDV cm/sRI  Left KidneyPSV cm/sEDV cm/sRI    +------------+--------+--------+----+-----------+--------+--------+----+ Upper Pole  22      10      0.54Upper Pole 14      3       0.80 +------------+--------+--------+----+-----------+--------+--------+----+ Mid         10      4       0.64Mid        54      11      0.79 +------------+--------+--------+----+-----------+--------+--------+----+ Lower Pole  8       4       0.55Lower Pole 28      6       0.78 +------------+--------+--------+----+-----------+--------+--------+----+ Hilar       44      6       0.86Hilar      37      10      0.73 +------------+--------+--------+----+-----------+--------+--------+----+ +------------------+-----+------------------+-----+ Right Kidney           Left Kidney             +------------------+-----+------------------+-----+ RAR                    RAR                     +------------------+-----+------------------+-----+ RAR (manual)      0.55 RAR (manual)  0.29  +------------------+-----+------------------+-----+ Cortex                 Cortex                  +------------------+-----+------------------+-----+ Cortex thickness       Corex thickness         +------------------+-----+------------------+-----+ Kidney length (cm)11.50Kidney length (cm)12.00 +------------------+-----+------------------+-----+  Summary: Renal:  Right: Normal size right kidney. Normal right Resisitive Index.        Cyst(s) noted. Left:  Abnormal left Resisitve Index. Normal size of left kidney.        Cyst(s) noted. Decreased velocities in the left renal artery        artery compared to the right. 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 Stenotic flow noted in the Celiac artery.  *See table(s) above for measurements and observations.  Diagnosing physician: Orlie Pollen  Electronically signed by Orlie Pollen on 09/14/2022 at 1:33:46 PM.    Final    US RENAL  Result Date: 09/11/2022 CLINICAL DATA:  Acute  on chronic kidney disease. EXAM: RENAL / URINARY TRACT ULTRASOUND COMPLETE COMPARISON:  May 02, 2022. FINDINGS: Right Kidney: Renal measurements: 10.5 x 4.7 x 3.9 cm = volume: 99 mL. Two simple cysts are noted, the largest measuring 2.5 cm in upper pole. Increased echogenicity of renal parenchyma is noted. No mass or hydronephrosis visualized. Left Kidney: Renal measurements: 12.1 x 6.2 x 5.8 cm = volume: 228 mL. 1 cm simple cyst is seen in upper pole. Increased echogenicity of renal parenchyma is noted. No mass or hydronephrosis visualized. Bladder: Appears normal for degree of bladder distention. Left ureteral jet is noted. Other: None. IMPRESSION: Increased echogenicity of renal parenchyma is noted bilaterally suggesting medical renal disease. No hydronephrosis or renal obstruction is noted. Electronically Signed   By: Marijo Conception M.D.   On: 09/11/2022 16:54      Subjective: Significant events overnight, he denies any complaint, he is eager to go home, related in the hallway with hypoxia for which she is requiring oxygen, but no significant dyspnea though.  Discharge Exam: Vitals:   09/17/22 0424 09/17/22 1141  BP: 134/62 129/69  Pulse: 81 67  Resp:    Temp: 98.2 F (36.8 C) 97.8 F (36.6 C)  SpO2: (!) 86% (!) 88%   Vitals:   09/16/22 1638 09/16/22 2125 09/17/22 0424 09/17/22 1141  BP: 136/65 (!) 134/57 134/62 129/69  Pulse: 74 80 81 67  Resp: 20     Temp: (!) 97.5 F (36.4 C) 98.2 F (36.8 C) 98.2 F (36.8 C) 97.8 F (36.6 C)  TempSrc: Oral Oral Oral Oral  SpO2: 91% (!) 85% (!) 86% (!) 88%  Weight:      Height:        General: Pt is alert, awake, not in acute distress Cardiovascular: RRR, S1/S2 +, no rubs, no gallops Respiratory: Bilateral rales and rhonchi Abdominal: Soft, NT, ND, bowel sounds + Extremities: no edema, no cyanosis    The results of significant diagnostics from this hospitalization (including imaging, microbiology, ancillary and laboratory) are  listed below for reference.     Microbiology: Recent Results (from the past 240 hour(s))  Urine Culture     Status: Abnormal   Collection Time: 09/12/22  5:23 PM   Specimen: Urine, Clean Catch  Result Value Ref Range Status   Specimen Description URINE, CLEAN CATCH  Final   Special Requests   Final    NONE Performed at  [facility], [place] [address], [place], [place] [address]    Culture (A)  Final    >=100,000 COLONIES/mL ESCHERICHIA COLI >=100,000 COLONIES/mL ENTEROBACTER CLOACAE    Report Status 09/16/2022 FINAL  Final   Organism ID, Bacteria ESCHERICHIA COLI (A)  Final   Organism ID, Bacteria ENTEROBACTER CLOACAE (A)  Final      Susceptibility   Enterobacter cloacae - MIC*    CEFAZOLIN >=64 RESISTANT Resistant     CEFEPIME <=0.12 SENSITIVE Sensitive     CIPROFLOXACIN <=0.25 SENSITIVE Sensitive     GENTAMICIN <=1 SENSITIVE Sensitive     IMIPENEM <=0.25 SENSITIVE Sensitive     NITROFURANTOIN 32 SENSITIVE Sensitive     TRIMETH/SULFA <=20 SENSITIVE Sensitive     PIP/TAZO 8 SENSITIVE Sensitive     * >=100,000 COLONIES/mL ENTEROBACTER CLOACAE   Escherichia coli - MIC*    AMPICILLIN >=32 RESISTANT Resistant     CEFAZOLIN <=4 SENSITIVE Sensitive     CEFEPIME <=0.12 SENSITIVE Sensitive     CEFTRIAXONE <=0.25 SENSITIVE Sensitive     CIPROFLOXACIN >=4 RESISTANT Resistant     GENTAMICIN <=1 SENSITIVE Sensitive     IMIPENEM <=0.25 SENSITIVE Sensitive     NITROFURANTOIN <=16 SENSITIVE Sensitive     TRIMETH/SULFA <=20 SENSITIVE Sensitive     AMPICILLIN/SULBACTAM 4 SENSITIVE Sensitive     PIP/TAZO <=4 SENSITIVE Sensitive     * >=100,000 COLONIES/mL ESCHERICHIA COLI     Labs: BNP (last 3 results) Recent Labs    09/16/22 0346  BNP 7,619.5*   Basic Metabolic Panel: Recent Labs  Lab 09/13/22 0214 09/14/22 0258 09/15/22 0314 09/16/22 0346 09/17/22 0251  NA 142 143 142 142 139  K 4.8 4.1 3.9 3.8 3.8  CL 110 105 105 101 97*  CO2 17* 21* '24 27 27  '$ GLUCOSE 130* 85  101* 115* 96  BUN 96* 92* 87* 88* 91*  CREATININE 7.85* 7.67* 7.30* 6.92* 7.13*  CALCIUM 7.9* 7.8* 7.7* 7.8* 7.8*  PHOS 8.5* 7.8* 7.0* 7.0* 7.7*   Liver Function Tests: Recent Labs  Lab 09/13/22 0214 09/14/22 0258 09/15/22 0314 09/16/22 0346 09/17/22 0251  ALBUMIN 2.7* 2.6* 2.6* 2.6* 2.5*   No results for input(s): "LIPASE", "AMYLASE" in the last 168 hours. No results for input(s): "AMMONIA" in the last 168 hours. CBC: Recent Labs  Lab 09/12/22 1135 09/13/22 0214 09/14/22 0258 09/14/22 1814 09/15/22 0314 09/16/22 0346 09/17/22 0251  WBC 6.3 6.7 6.2  --  7.4 7.3 7.4  NEUTROABS 4.4 4.9  --   --   --   --   --   HGB 9.4* 8.7* 7.8* 9.0* 9.1* 9.2* 9.3*  HCT 30.2* 26.8* 23.5* 27.2* 26.3* 27.7* 29.0*  MCV 97.1 94.4 92.5  --  88.9 90.5 92.7  PLT 249 224 189  --  203 194 196   Cardiac Enzymes: No results for input(s): "CKTOTAL", "CKMB", "CKMBINDEX", "TROPONINI" in the last 168 hours. BNP: Invalid input(s): "POCBNP" CBG: No results for input(s): "GLUCAP" in the last 168 hours. D-Dimer No results for input(s): "DDIMER" in the last 72 hours. Hgb A1c No results for input(s): "HGBA1C" in the last 72 hours. Lipid Profile No results for input(s): "CHOL", "HDL", "LDLCALC", "TRIG", "CHOLHDL", "LDLDIRECT" in the last 72 hours. Thyroid function studies No results for input(s): "TSH", "T4TOTAL", "T3FREE", "THYROIDAB" in the last 72 hours.  Invalid input(s): "FREET3" Anemia work up No results for input(s): "VITAMINB12", "FOLATE", "FERRITIN", "TIBC", "IRON", "RETICCTPCT" in the last 72 hours. Urinalysis    Component Value  Date/Time   COLORURINE YELLOW 09/12/2022 1533   APPEARANCEUR CLOUDY (A) 09/12/2022 1533   LABSPEC 1.010 09/12/2022 1533   PHURINE 5.0 09/12/2022 1533   GLUCOSEU NEGATIVE 09/12/2022 1533   GLUCOSEU NEGATIVE 01/14/2011 0938   HGBUR MODERATE (A) 09/12/2022 1533   BILIRUBINUR NEGATIVE 09/12/2022 1533   KETONESUR NEGATIVE 09/12/2022 1533   PROTEINUR 100 (A)  09/12/2022 1533   UROBILINOGEN 0.2 07/29/2013 1637   NITRITE NEGATIVE 09/12/2022 1533   LEUKOCYTESUR LARGE (A) 09/12/2022 1533   Sepsis Labs Recent Labs  Lab 09/14/22 0258 09/15/22 0314 09/16/22 0346 09/17/22 0251  WBC 6.2 7.4 7.3 7.4   Microbiology Recent Results (from the past 240 hour(s))  Urine Culture     Status: Abnormal   Collection Time: 09/12/22  5:23 PM   Specimen: Urine, Clean Catch  Result Value Ref Range Status   Specimen Description URINE, CLEAN CATCH  Final   Special Requests   Final    NONE Performed at [facility], [place] [address], [place], [place] [address]    Culture (A)  Final    >=100,000 COLONIES/mL ESCHERICHIA COLI >=100,000 COLONIES/mL ENTEROBACTER CLOACAE    Report Status 09/16/2022 FINAL  Final   Organism ID, Bacteria ESCHERICHIA COLI (A)  Final   Organism ID, Bacteria ENTEROBACTER CLOACAE (A)  Final      Susceptibility   Enterobacter cloacae - MIC*    CEFAZOLIN >=64 RESISTANT Resistant     CEFEPIME <=0.12 SENSITIVE Sensitive     CIPROFLOXACIN <=0.25 SENSITIVE Sensitive     GENTAMICIN <=1 SENSITIVE Sensitive     IMIPENEM <=0.25 SENSITIVE Sensitive     NITROFURANTOIN 32 SENSITIVE Sensitive     TRIMETH/SULFA <=20 SENSITIVE Sensitive     PIP/TAZO 8 SENSITIVE Sensitive     * >=100,000 COLONIES/mL ENTEROBACTER CLOACAE   Escherichia coli - MIC*    AMPICILLIN >=32 RESISTANT Resistant     CEFAZOLIN <=4 SENSITIVE Sensitive     CEFEPIME <=0.12 SENSITIVE Sensitive     CEFTRIAXONE <=0.25 SENSITIVE Sensitive     CIPROFLOXACIN >=4 RESISTANT Resistant     GENTAMICIN <=1 SENSITIVE Sensitive     IMIPENEM <=0.25 SENSITIVE Sensitive     NITROFURANTOIN <=16 SENSITIVE Sensitive     TRIMETH/SULFA <=20 SENSITIVE Sensitive     AMPICILLIN/SULBACTAM 4 SENSITIVE Sensitive     PIP/TAZO <=4 SENSITIVE Sensitive     * >=100,000 COLONIES/mL ESCHERICHIA COLI     Time coordinating discharge: Over 30 minutes  SIGNED:   Phillips Climes, MD  [facility] 09/17/2022, 1:46 PM Pager   If 7PM-7AM, please contact night-coverage [email or web address]

## 2022-09-17 NOTE — Progress Notes (Addendum)
[facility] Progress Note     Assessment/ Plan:    AKI/CKD stage IV - rapid decline in renal function after increasing losartan dose to 50 mg on 07/24/22.  Serology workup negative, renal US without hydronephrosis.  Will order renal artery duplex to r/o RAS (given long tobacco history).  Gentle IVF's overnight and follow UOP and renal function.  He has no uremic symptoms.  No urgent indication for dialysis, however we did discuss the possible need for RRT in the near future if he does not improve off of ARB.  Renal function slowly improving; cr low 3's 04/2022; followed by Dr. Harrie Jeans.  Avoid nephrotoxic medications including NSAIDs and iodinated intravenous contrast exposure unless the latter is absolutely indicated.   Preferred narcotic agents for pain control are hydromorphone, fentanyl, and methadone. Morphine should not be used.  Avoid Baclofen and avoid oral sodium phosphate and magnesium citrate based laxatives / bowel preps.  Continue strict Input and Output monitoring. 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 Virl Cagey reading. Abnormal RI and flows in the left side. Biopsy Monday-- requisition form in chart; high RI with decreased flows on duplex of the left side and intially I requested for bx of the right kidney but after d/w Dr. Kathlene Cote the left kidney has much greater volume than a small RK on ultrasound 09/11/2022. With that being the case I agree that a left kidney biopsy would have better yield.  Appreciate IR biopsy of the right renal lower pole on 11/6 (they 1st checked to see which kidney had a thicker cortex to sample). Counts are stable. Should be able to go home if off O2 and will have office call him with f/u in a week w/ dr Royce Macadamia to go over the results of the biopsy sent to [facility]. Should d/c with Lasix '40mg'$  twice daily upon d/c  for at least 2-3 days and then once a day. I also educated spouse and pt on restrictions; no dark sodas, watch the phos and K in diet + fluid restrict to 0.5L / day and certainly no more than 1.5L a day.  Ceftriaxone for UTI  Proteinuria - UPC 6862 mg/g on 06/14/22.  Will order 24 hour urine for Had negative SPEP and UPEP.  Biopsy Monday by [facility] and stable. HTN - new home regimen amlodipine 10 mg daily, hydralazine 50 mg bid, metoprolol ER 50 mg daily.  Continue to hold losartan for now.  Urinary retention - remove foley upon d/c and he will self cath tid; he already has the caths at home and had been doing that prior to hospitalization. H/o bladder cancer - recent cystoscopy in February 2023 without evidence of recurrent disease. Anemia of CKD stage IV - Hgb has dropped from 12.2 on 07/23/22 to 9.4 today.  Will check iron stores and guaiac stool.  Got 1 u pRBCs today, got aranesp Sat OSA - does not use CPAP  Subjective:   Denies f/c/n/v; somewhat more congested. Adamant about wanting to go home today. He is on O2 Speed. Spouse is bedside and states pt has been ambulating. Congested overnight with SOB -> Lasix   Objective:   BP 134/62   Pulse 81   Temp 98.2 F (36.8 C) (Oral)   Resp 20   Ht '5\' 10"'$  (1.778 m)   Wt 75.6 kg   SpO2 (!) 86%  BMI 23.91 kg/m   Intake/Output Summary (Last 24 hours) at 09/17/2022 0802 Last data filed at 09/17/2022 0430 Gross per 24 hour  Intake 120 ml  Output 1450 ml  Net -1330 ml   Weight change:   Physical Exam: GHW:EXHBZJI in bed Resp:rales at bases Abd: + Foley in place, clear urine Ext: does not appear to be swelling Back: No flank tenderness CV: S1S2, not tachycardic  Imaging: DG Chest Port 1 View  Result Date: 09/16/2022 CLINICAL DATA:  Hypoxia. EXAM: PORTABLE CHEST 1 VIEW COMPARISON:  April 23, 2022. FINDINGS: Stable cardiomediastinal silhouette. Increased bibasilar opacities are noted concerning for worsening edema or infiltrates. No  pneumothorax is noted. Bony thorax is unremarkable. IMPRESSION: Increased bibasilar opacities are noted concerning for worsening edema or infiltrates. Aortic Atherosclerosis (ICD10-I70.0). Electronically Signed   By: Marijo Conception M.D.   On: 09/16/2022 16:03   US BIOPSY (KIDNEY)  Result Date: 09/16/2022 INDICATION: Proteinuria, AKI EXAM: Ultrasound-guided random renal biopsy MEDICATIONS: None. ANESTHESIA/SEDATION: Moderate (conscious) sedation was employed during this procedure. A total of Versed 1 mg and Fentanyl 50 mcg was administered intravenously by the radiology nurse. Total intra-service moderate Sedation Time: 13 minutes. The patient's level of consciousness and vital signs were monitored continuously by radiology nursing throughout the procedure under my direct supervision. COMPLICATIONS: None immediate. PROCEDURE: Informed written consent was obtained from the patient after a thorough discussion of the procedural risks, benefits and alternatives. All questions were addressed. Maximal Sterile Barrier Technique was utilized including caps, mask, sterile gowns, sterile gloves, sterile drape, hand hygiene and skin antiseptic. A timeout was performed prior to the initiation of the procedure. The patient was placed prone on the exam table. Limited ultrasound of the bilateral flanks was performed for planning purposes. The right renal lower pole was selected as appropriate for percutaneous biopsy access. Skin entry site was marked, and the overlying skin was prepped and draped in the standard sterile fashion. Local analgesia was obtained with 1% lidocaine. Using ultrasound guidance, a 17 gauge introducer needle was advanced just deep to the cortex of the lower pole. Subsequently, core needle biopsy was performed using a 18 gauge core biopsy device x2 total passes. Specimens were submitted in saline to pathology for further handling. Gel-Foam slurry was administered through the introducer needle as it was  removed to assist with hemostasis. Limited postprocedure imaging demonstrated expected post biopsy changes without hematoma. A clean dressing was placed. The patient tolerated the procedure well without immediate complication. IMPRESSION: Successful ultrasound-guided random renal biopsy of the right renal lower pole. Electronically Signed   By: Albin Felling M.D.   On: 09/16/2022 14:24    Labs: BMET Recent Labs  Lab 09/12/22 1135 09/13/22 0214 09/14/22 0258 09/15/22 0314 09/16/22 0346 09/17/22 0251  NA 139 142 143 142 142 139  K 5.5* 4.8 4.1 3.9 3.8 3.8  CL 110 110 105 105 101 97*  CO2 15* 17* 21* '24 27 27  '$ GLUCOSE 122* 130* 85 101* 115* 96  BUN 97* 96* 92* 87* 88* 91*  CREATININE 7.82* 7.85* 7.67* 7.30* 6.92* 7.13*  CALCIUM 8.2* 7.9* 7.8* 7.7* 7.8* 7.8*  PHOS  --  8.5* 7.8* 7.0* 7.0* 7.7*   CBC Recent Labs  Lab 09/12/22 1135 09/13/22 0214 09/14/22 0258 09/14/22 1814 09/15/22 0314 09/16/22 0346 09/17/22 0251  WBC 6.3 6.7 6.2  --  7.4 7.3 7.4  NEUTROABS 4.4 4.9  --   --   --   --   --   HGB 9.4*  8.7* 7.8* 9.0* 9.1* 9.2* 9.3*  HCT 30.2* 26.8* 23.5* 27.2* 26.3* 27.7* 29.0*  MCV 97.1 94.4 92.5  --  88.9 90.5 92.7  PLT 249 224 189  --  203 194 196    Medications:     sodium chloride   Intravenous Once   amLODipine  10 mg Oral QPM   atorvastatin  20 mg Oral Daily   ceFEPime (MAXIPIME) IV  500 mg Intravenous Q24H   Chlorhexidine Gluconate Cloth  6 each Topical Daily   cholecalciferol  1,000 Units Oral Daily   darbepoetin (ARANESP) injection - NON-DIALYSIS  60 mcg Subcutaneous Q Sat-1800   furosemide  60 mg Intravenous Once   gelatin adsorbable  1 each Topical Once   hydrALAZINE  50 mg Oral TID   lidocaine (PF)  10 mL Intradermal Once   melatonin  5 mg Oral QHS   metoprolol succinate  50 mg Oral QPM   tamsulosin  0.8 mg Oral QHS      Otelia Santee, MD 09/17/2022, 8:02 AM

## 2022-09-18 ENCOUNTER — Encounter: Payer: Self-pay | Admitting: *Deleted

## 2022-09-18 ENCOUNTER — Telehealth: Payer: Self-pay

## 2022-09-18 ENCOUNTER — Telehealth: Payer: Self-pay | Admitting: *Deleted

## 2022-09-18 DIAGNOSIS — N179 Acute kidney failure, unspecified: Secondary | ICD-10-CM

## 2022-09-18 NOTE — Telephone Encounter (Signed)
   Telephone encounter was:  Successful.  09/18/2022 Name: ROSHAD HACK MRN: [id number] DOB: 1941/10/27  Javier Docker is a 81 y.o. year old male who is a primary care patient of [name], DO . The community resource team was consulted for assistance with Transportation Needs   Care guide performed the following interventions: Patient provided with information about care guide support team and interviewed to confirm resource needs.Patient needs extra resources for transportation needs I have mailed resources to the patient. Patient will call to check on [employer] transportation benifits   Follow Up Plan:  No further follow up planned at this time. The patient has been provided with needed resources.    [place], Care Management  [phone] [address], [place], [place] [address] Phone: [phone] Email: [email or web address]

## 2022-09-18 NOTE — Telephone Encounter (Signed)
PLEASE DISREGARD OPENED IN ERROR

## 2022-09-18 NOTE — Patient Outreach (Signed)
Care Coordination [facility] Note Transition Care Management Follow-up Telephone Call Date of discharge and from where: Tuesday, 09/17/22 Jeremy [name]- AKI, worsening creatinine levels How have you been since you were released from the hospital? Per spouse/ caregiver [name], "He is overall doing well, but I have questions about his medications and about how to use this oxygen safely.... we did get the oxygen and the nebulizer and it is all in place, but I just have not used this equipment before.  We have all of his medications, and I am trying to make sure I am giving it correctly because there was a lot of changes.  I don't think there will be a problem with [place] getting him to the doctors appointments, but I would like to know what resources are available for [place], because he normally does all of the driving.... I am not really used to doing much driving... thank God my brother is here for a little while to help [place] out, and he is also able to help with transportation.... but I don't know how long he will be staying with [place]." Any questions or concerns? Yes- understanding new medication dosing; understanding safe use of home oxygen; possible transportation needs: All concerns addressed accoridngly  Items Reviewed: Did the pt receive and understand the discharge instructions provided? Yes  reviewed extensively with patient's spouse/ caregiver Medications obtained and verified? Yes  addressed caregiver's questions about newly prescribed medications: clarified for caregiver that newly/ recently prescribed Lasix should be given BID x 3 days, and then QD starting on Saturday 09/21/22; confirmed that all new medications have bneen obtained and caregiver is currently handling all aspects of medication administration: extensive review completed around specific instructions for new medications Other? No  Any new allergies since your discharge? No  Dietary orders reviewed? Yes Do you have support at home? Yes  caregiver/ spouse  and her brother are assisting with all ADL's and iADL's as indicated  Home Care and Equipment/Supplies: Were home health services ordered? no If so, what is the name of the agency? N/A  Has the agency set up a time to come to the patient's home? not applicable Were any new equipment or medical supplies ordered?  Yes: nebulizer, home oxygen, walker What is the name of the medical supply agency? [employer] Were you able to get the supplies/equipment? yes Do you have any questions related to the use of the equipment or supplies? Yes: home oxygen: provided extensive education around dosing of home O2- currently using at 2 L/min; when to use home O2; monitoring of SaO2 levels at home; confirmed patient has successfully used home nebulizer; confirmed patient obtained walker and is using prn- spouse reports not having to use regularly at this time  Functional Questionnaire: (I = Independent and D = Dependent) ADLs: I  caregiver/ spouse and her brother are assisting with all ADL's and iADL's as indicated  Bathing/Dressing- I  caregiver/ spouse and her brother are assisting with all ADL's and iADL's as indicated  Meal Prep- D  caregiver/ spouse and her brother are assisting with all ADL's and iADL's as indicated  Eating- I  Maintaining continence- I  Patient performs self- urinary catherization 3- 4 times per day at baseline; caregiver/ spouse and her brother are assisting with all ADL's and iADL's as indicated  Transferring/Ambulation- I  Managing Meds- I  caregiver/ spouse and her brother are assisting with all ADL's and iADL's as indicated  Follow up appointments reviewed:  PCP Hospital f/u appt confirmed? No  Scheduled to see - on - @ - encouraged patient's caregiver to schedule prompt post-hospital discharge PCP office visit- she declines assistance with this [facility] f/u appt confirmed?  Caregiver states she was on the phone scheduling with renal provider when I called for TOC--  reports she wants to call back after our call is through so she can schedule herself  Scheduled to see - on - @ - verbally contracted with caregiver that I would re-contact her later this week to ensure provider appointments have been scheduled Are transportation arrangements needed? No - reports may need resources in future- [place] referral placed accordingly If their condition worsens, is the pt aware to call PCP or go to the Emergency Dept.? Yes Was the patient provided with contact information for the PCP's office or ED? Yes Was to pt encouraged to call back with questions or concerns? Yes provided my direct phone number should problems/ concerns arise in the future  SDOH assessments and interventions completed:   Yes  Care Coordination Interventions Activated:  Yes   Care Coordination Interventions:  Referred for Care Coordination Services:  [place] for transportation resources Provided extensive education and clarification around post-hospital discharge medications/ safe use of home O2/ need for prompt provider office follow up for lab work and CXR    Encounter Outcome:  Pt. Visit Completed    Oneta Rack, RN, BSN, CCRN Alumnus RN CM Care Coordination/ Transition of [place] Management [phone]: direct office

## 2022-09-19 ENCOUNTER — Encounter (HOSPITAL_COMMUNITY): Payer: Self-pay

## 2022-09-19 DIAGNOSIS — E875 Hyperkalemia: Secondary | ICD-10-CM | POA: Diagnosis not present

## 2022-09-19 DIAGNOSIS — N184 Chronic kidney disease, stage 4 (severe): Secondary | ICD-10-CM | POA: Diagnosis not present

## 2022-09-19 DIAGNOSIS — I129 Hypertensive chronic kidney disease with stage 1 through stage 4 chronic kidney disease, or unspecified chronic kidney disease: Secondary | ICD-10-CM | POA: Diagnosis not present

## 2022-09-19 DIAGNOSIS — R809 Proteinuria, unspecified: Secondary | ICD-10-CM | POA: Diagnosis not present

## 2022-09-19 DIAGNOSIS — N189 Chronic kidney disease, unspecified: Secondary | ICD-10-CM | POA: Diagnosis not present

## 2022-09-19 DIAGNOSIS — R339 Retention of urine, unspecified: Secondary | ICD-10-CM | POA: Diagnosis not present

## 2022-09-19 DIAGNOSIS — N179 Acute kidney failure, unspecified: Secondary | ICD-10-CM | POA: Diagnosis not present

## 2022-09-19 DIAGNOSIS — K551 Chronic vascular disorders of intestine: Secondary | ICD-10-CM | POA: Diagnosis not present

## 2022-09-19 DIAGNOSIS — E872 Acidosis, unspecified: Secondary | ICD-10-CM | POA: Diagnosis not present

## 2022-09-19 DIAGNOSIS — R319 Hematuria, unspecified: Secondary | ICD-10-CM | POA: Diagnosis not present

## 2022-09-20 ENCOUNTER — Telehealth: Payer: Self-pay | Admitting: *Deleted

## 2022-09-20 ENCOUNTER — Encounter: Payer: Self-pay | Admitting: *Deleted

## 2022-09-20 LAB — SURGICAL PATHOLOGY

## 2022-09-20 NOTE — Patient Outreach (Signed)
Care Coordination   Initial Visit Note   09/20/2022 Name: Jeremy [name] MRN: [id number] DOB: 04-02-1941  Jeremy [name] is a 81 y.o. year old male who sees Jeremy Nutting, DO for primary care. I spoke with spouse Jeremy [name], on [facility] DPR re: Jeremy [name] by phone today.  What matters to the patients health and wellness today?  Per spouse/ caregiver: "Things are falling into place; we saw the renal doctor yesterday and they made a few changes to his medications- told me to only give the lasix once a day going forward; overall, gave him a good report.  He is doing better as each day goes by and is able to walk up and down the stairs without shortness of breath.  The home O2 people came over yesterday and answered our questions, but I still feel a bit nervous about using it, right now he is only using a couple of times during the day, he is not needing it very often.  I heard from the lady about the transportation benefits, I am still looking in to the options, but right now we are okay because my brother is still here and can help with getting where we need to go.  We got our flu vaccines a couple of weeks ago, so we are up to date on that."  Interventions provided; No further or ongoing care coordination needs identified today- spouse confirms she has my direct phone number should concerns/ questions/ needs arise int he future, encouraged her to call promptly for any new problems that arise and she verbalized understanding of same   Goals Addressed             This Visit's Progress    COMPLETED: Care Coordination Activities: No Follow Up Required   On track    Care Coordination Interventions: Evaluation of current treatment plan related to CKD/ post-hospital shortness of breath and patient's adherence to plan as established by provider Advised patient to provide appropriate vaccination information to provider or CM team member at next visit Advised patient to continue using home O2 as  instructed; confirmed DME rep has been to patient's home yesterday and provided instruction in home O2 use- wife confirms she has contact information for DME company and understands to call for any problems/ questions/ concerns that arise in the future Provided education to patient re: reinforced basic dosing/ use of home oxygen, as we discussed during [name] call 09/18/22 Provided patient and/or caregiver with resource information about transportation- confirmed [employer] contacted patient's spouse and provided resources- spouse states she has not yet called insurance company to determine possible transportation benefit but will do "soon;" we also discussed other options for transportation, including private pay medical transport Advice worker) Provided patient with [employer] fast streamer related to use of home oxygen Reviewed scheduled/upcoming provider appointments including 10/24/22- PCP; confirmed patient attended specialist renal provider appointment yesterday 09/19/22- we reviewed this visit today and spouse verbalizes good understanding of post-visit instructions: reports renal provider advised to begin giving diuretic QD, and stop giving BID as per hospital discharge instructions Advised patient to discuss any medication concerns/ any clinical concerns with provider Assessed social determinant of health barriers Confirmed patient obtained flu vaccine for 2023-24 flu/ winter season "a couple of weeks ago at the [employer];" confirmed spouse has my direct phone number for any needs/ questions/ concerns that arise in the future- encouraged her to call myself or other care providers promptly for any new concerns that arise  and she verbalizes agreement and understanding           SDOH assessments and interventions completed:  No Previously completed SDOH on 09/18/22 with TOC call   Care Coordination Interventions Activated:  Yes  Care Coordination Interventions:   Yes, provided   Follow up plan: No further intervention required.   Encounter Outcome:  Pt. Visit Completed   Oneta Rack, RN, BSN, CCRN Alumnus RN CM Care Coordination/ Transition of [place] Management [phone]: direct office

## 2022-09-20 NOTE — Patient Instructions (Signed)
Visit Information  Thank you for taking time to visit with me today. Please don't hesitate to contact me if I can be of assistance to you.   Following are the goals we discussed today:   Goals Addressed             This Visit's Progress    COMPLETED: Care Coordination Activities: No Follow Up Required   On track    Care Coordination Interventions: Evaluation of current treatment plan related to CKD/ post-hospital shortness of breath and patient's adherence to plan as established by provider Advised patient to provide appropriate vaccination information to provider or CM team member at next visit Advised patient to continue using home O2 as instructed; confirmed DME rep has been to patient's home yesterday and provided instruction in home O2 use- wife confirms she has contact information for DME company and understands to call for any problems/ questions/ concerns that arise in the future Provided education to patient re: reinforced basic dosing/ use of home oxygen, as we discussed during [place] call 09/18/22 Provided patient and/or caregiver with resource information about transportation- confirmed [employer] contacted patient's spouse and provided resources- spouse states she has not yet called insurance company to determine possible transportation benefit but will do "soon;" we also discussed other options for transportation, including private pay medical transport Advice worker) Provided patient with [employer] fast streamer related to use of home oxygen Reviewed scheduled/upcoming provider appointments including 10/24/22- PCP; confirmed patient attended specialist renal provider appointment yesterday 09/19/22- we reviewed this visit today and spouse verbalizes good understanding of post-visit instructions: reports renal provider advised to begin giving diuretic QD, and stop giving BID as per hospital discharge instructions Advised patient to discuss any medication  concerns/ any clinical concerns with provider Assessed social determinant of health barriers Confirmed patient obtained flu vaccine for 2023-24 flu/ winter season "a couple of weeks ago at the [employer];" confirmed spouse has my direct phone number for any needs/ questions/ concerns that arise in the future- encouraged her to call myself or other care providers promptly for any new concerns that arise and she verbalizes agreement and understanding           If you are experiencing a Mental Health or [place] or need someone to talk to, please  call the Suicide and Crisis Lifeline: 988 call the [place] National Suicide Prevention Lifeline: 514-798-4965 or TTY: 251-398-8760 TTY 402-450-8229) to talk to a trained counselor call 1-800-273-TALK (toll free, 24 hour hotline) go to [facility] [address], [place] [phone]) call the [place]: [phone] call 911   Patient verbalizes understanding of instructions and care plan provided today and agrees to view in [place]. Active MyChart status and patient understanding of how to access instructions and care plan via MyChart confirmed with patient.     No further follow up required: no further or ongoing care coordination needs identified today- spouse verbalizes understanding to call promptly for any new needs that arise and confirms she has my direct phone number  Oneta Rack, RN, BSN, CCRN Alumnus RN CM Care Coordination/ Transition of [place] Management [phone]: direct office

## 2022-09-24 ENCOUNTER — Telehealth: Payer: Self-pay

## 2022-09-24 NOTE — Telephone Encounter (Signed)
Patients wife called stating that the patient just got out of the hospital last Tuesday for AKI and on the discharge papers it was highlighted for the patient to follow up with Dr. Zigmund Daniel on 12/14. She was wanting to know if he needs to keep that appointment since it was already scheduled before he was in the hospital or does Dr. Zigmund Daniel need to see him sooner?

## 2022-09-25 NOTE — Telephone Encounter (Signed)
Spoke with the patients wife, [name] and she seems to understand the instructions given.   Nothing further needed.

## 2022-09-25 NOTE — Telephone Encounter (Signed)
I contacted [name], the spouse of the patient and she wants to know which doctor ordered the oxygen for the patient and the insurance company told her Dr. Lenna Gilford but he is retired. She wants to make sure that insurance has [name] name on the orders.   She also needs more information on what she is supposed to be doing with all this oxygen stuff that was sent to the house.  There was no instructions and she don't know when to give him oxygen or how much to give him.  Please contact the patient.  Thank you,  Arbie Cookey, CMA

## 2022-09-25 NOTE — Telephone Encounter (Signed)
It appears that O2 was ordered at d/c from the hospital.  Recommend setting at 2L/min and he should use most of the time throughout the day and/or when sleeping.  We'll see how he is doing at follow up and determine if he needs to continue.

## 2022-09-27 DIAGNOSIS — N390 Urinary tract infection, site not specified: Secondary | ICD-10-CM | POA: Diagnosis not present

## 2022-09-27 DIAGNOSIS — N179 Acute kidney failure, unspecified: Secondary | ICD-10-CM | POA: Diagnosis not present

## 2022-09-27 DIAGNOSIS — I129 Hypertensive chronic kidney disease with stage 1 through stage 4 chronic kidney disease, or unspecified chronic kidney disease: Secondary | ICD-10-CM | POA: Diagnosis not present

## 2022-09-27 DIAGNOSIS — R339 Retention of urine, unspecified: Secondary | ICD-10-CM | POA: Diagnosis not present

## 2022-09-27 DIAGNOSIS — R319 Hematuria, unspecified: Secondary | ICD-10-CM | POA: Diagnosis not present

## 2022-09-27 DIAGNOSIS — K551 Chronic vascular disorders of intestine: Secondary | ICD-10-CM | POA: Diagnosis not present

## 2022-09-27 DIAGNOSIS — R809 Proteinuria, unspecified: Secondary | ICD-10-CM | POA: Diagnosis not present

## 2022-09-27 DIAGNOSIS — E872 Acidosis, unspecified: Secondary | ICD-10-CM | POA: Diagnosis not present

## 2022-09-27 DIAGNOSIS — E875 Hyperkalemia: Secondary | ICD-10-CM | POA: Diagnosis not present

## 2022-09-27 DIAGNOSIS — N184 Chronic kidney disease, stage 4 (severe): Secondary | ICD-10-CM | POA: Diagnosis not present

## 2022-09-30 ENCOUNTER — Other Ambulatory Visit: Payer: Self-pay

## 2022-09-30 DIAGNOSIS — N186 End stage renal disease: Secondary | ICD-10-CM

## 2022-10-01 ENCOUNTER — Ambulatory Visit (INDEPENDENT_AMBULATORY_CARE_PROVIDER_SITE_OTHER): Payer: Medicare Other | Admitting: Vascular Surgery

## 2022-10-01 ENCOUNTER — Ambulatory Visit (HOSPITAL_COMMUNITY)
Admission: RE | Admit: 2022-10-01 | Discharge: 2022-10-01 | Disposition: A | Discharge status: Home / Self Care | Payer: Medicare Other | Source: Ambulatory Visit | Attending: Vascular Surgery | Admitting: Vascular Surgery

## 2022-10-01 ENCOUNTER — Ambulatory Visit (INDEPENDENT_AMBULATORY_CARE_PROVIDER_SITE_OTHER)
Admission: RE | Admit: 2022-10-01 | Discharge: 2022-10-01 | Disposition: A | Discharge status: Home / Self Care | Payer: Medicare Other | Source: Ambulatory Visit | Attending: Vascular Surgery | Admitting: Vascular Surgery

## 2022-10-01 ENCOUNTER — Encounter: Payer: Self-pay | Admitting: Vascular Surgery

## 2022-10-01 VITALS — BP 135/63 | HR 70 | Temp 97.8°F | Resp 18 | Ht 70.5 in | Wt 161.0 lb

## 2022-10-01 DIAGNOSIS — N184 Chronic kidney disease, stage 4 (severe): Secondary | ICD-10-CM | POA: Diagnosis not present

## 2022-10-01 DIAGNOSIS — N186 End stage renal disease: Secondary | ICD-10-CM | POA: Diagnosis not present

## 2022-10-01 NOTE — Progress Notes (Signed)
Patient name: Jeremy [name] MRN: [id number] DOB: [birth year] Sex: male  REASON FOR CONSULT: Access placement for HD  HPI: Jeremy [name] is a 81 y.o. 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  Patient is not on dialysis at this time.  He is right-handed.  No previous access in the past.  He was recently hospitalized and discharged on 09/17/2022 for worsening renal function.  He has follow-up with his nephrologist in several weeks with Dr. Royce Macadamia.  His wife states nephrology feels he will need dialysis soon.  He is retired from the [employer].  Past Medical History:  Diagnosis Date   Cancer [facility])    COPD (chronic obstructive pulmonary disease) ([place])    Enlarged prostate    Hyperlipidemia    Hypertension    Self-catheterizes urinary bladder    pt. does this morning and evening--been doing this since 11/2016   Sleep apnea    not using a cpap machine    Past Surgical History:  Procedure Laterality Date   BACK SURGERY     BLADDER TUMOR EXCISION  2015   benign   BLEPHAROPLASTY  12/2018   LUMBAR LAMINECTOMY/DECOMPRESSION MICRODISCECTOMY Bilateral 08/25/2017   Procedure: Bilateral Lumbar Three- Four, Lumbar Four- Five, Lumbar Five- Sacral One Laminectomy;  Surgeon: Kristeen Miss, MD;  Location: [facility];  Service: Neurosurgery;  Laterality: Bilateral;  Bilateral L3-4 L4-5 L5-S1 Laminectomy   SKIN CANCER EXCISION     Surg x2...   TONSILLECTOMY AND ADENOIDECTOMY     Surg as a child...   TRANSURETHRAL RESECTION OF BLADDER TUMOR N/A 01/03/2022   Procedure: TRANSURETHRAL RESECTION OF BLADDER TUMOR (TURBT)/ CYSTOSCOPY/ EXAM UNDER ANESTHESIA, BILATERAL RETROGRADE/ BLADDER BIOPSIES;  Surgeon: Raynelle Bring, MD;  Location: [facility];  Service: Urology;  Laterality: N/A;  GENERAL ANESTHESIA WITH PARALYSIS    Family History  Problem Relation Age of Onset   Colon cancer Neg Hx    Stomach cancer Neg Hx    Rectal  cancer Neg Hx     SOCIAL HISTORY: Social History   Socioeconomic History   Marital status: Married    Spouse name: Not on file   Number of children: Not on file   Years of education: Not on file   Highest education level: Not on file  Occupational History   Not on file  Tobacco Use   Smoking status: Every Day    Packs/day: 0.50    Years: 50.00    Total pack years: 25.00    Types: Cigarettes    Start date: 12/29/1961   Smokeless tobacco: Never   Tobacco comments:    Smokes 1/2 pack a day   Vaping Use   Vaping Use: Never used  Substance and Sexual Activity   Alcohol use: Yes    Comment: couple glasses with dinner daily   Drug use: No   Sexual activity: Not on file  Other Topics Concern   Not on file  Social History Narrative   Lives with wife in a 2 story home.  Has 2 children.  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, commissioning)   Social Determinants of Health   Financial Resource Strain: Not on file  Food Insecurity: No Food Insecurity (09/18/2022)   Hunger Vital Sign    Worried About Running Out of Food in the Last Year: Never true    Ran Out of Food in the Last Year: Never true  Transportation Needs:  No Transportation Needs (09/18/2022)   PRAPARE - Hydrologist (Medical): No    Lack of Transportation (Non-Medical): No  Physical Activity: Not on file  Stress: Not on file  Social Connections: Not on file  Intimate Partner Violence: Not At Risk (09/12/2022)   Humiliation, Afraid, Rape, and Kick questionnaire    Fear of Current or Ex-Partner: No    Emotionally Abused: No    Physically Abused: No    Sexually Abused: No    Allergies  Allergen Reactions   Sulfa Antibiotics     Rash, light headed, shaking   Penicillins Other (See Comments)    hives severe as a child     Current Outpatient Medications  Medication Sig Dispense Refill   albuterol (PROVENTIL) (2.5 MG/3ML) 0.083% nebulizer solution Inhale 3 mLs (2.5 mg total) by  nebulization every 4 (four) hours as needed for wheezing or shortness of breath. 90 mL 12   amLODipine (NORVASC) 10 MG tablet TAKE 1 TABLET EVERY DAY (Patient taking differently: Take 10 mg by mouth every evening.) 90 tablet 3   Camphor-Eucalyptus-Menthol (VICKS VAPORUB EX) Apply 1 application topically at bedtime as needed (congestion).     cholecalciferol (VITAMIN D3) 25 MCG (1000 UNIT) tablet Take 1,000 Units by mouth daily.     diphenhydrAMINE-zinc acetate (BENADRYL) cream Apply 1 Application topically daily as needed for itching.     furosemide (LASIX) 40 MG tablet Take 1 tablet (40 mg) by mouth twice daily  for 3 days, then 1 tablet daily 15 tablet 0   guaiFENesin (MUCINEX) 600 MG 12 hr tablet Take 1,200 mg by mouth 2 (two) times daily as needed for cough.     hydrALAZINE (APRESOLINE) 50 MG tablet Take 1 tablet (50 mg total) by mouth 3 (three) times daily. 90 tablet 0   METAMUCIL FIBER PO Take 2 capsules by mouth every evening.     metoprolol succinate (TOPROL-XL) 50 MG 24 hr tablet TAKE 1 TABLET DAILY WITH OR IMMEDIATELY FOLLOWING A MEAL (Patient taking differently: Take 50 mg by mouth every evening.) 90 tablet 3   olopatadine (PATANOL) 0.1 % ophthalmic solution Place 1 drop into both eyes daily as needed for dry eyes.     tamsulosin (FLOMAX) 0.4 MG CAPS Take 0.8 mg by mouth at bedtime.     cefdinir (OMNICEF) 300 MG capsule Take 1 capsule (300 mg total) by mouth daily. (Patient not taking: Reported on 10/01/2022) 5 capsule 0   methylPREDNISolone (MEDROL DOSEPAK) 4 MG TBPK tablet Taper as directed on packaging. (Patient not taking: Reported on 10/01/2022) 21 tablet 0   simvastatin (ZOCOR) 40 MG tablet TAKE 1 TABLET EVERY DAY (Patient not taking: Reported on 10/01/2022) 90 tablet 1   No current facility-administered medications for this visit.    REVIEW OF SYSTEMS:  '[X]'$  denotes positive finding, '[ ]'$  denotes negative finding Cardiac  Comments:  Chest pain or chest pressure:    Shortness of  breath upon exertion:    Short of breath when lying flat:    Irregular heart rhythm:        Vascular    Pain in calf, thigh, or hip brought on by ambulation:    Pain in feet at night that wakes you up from your sleep:     Blood clot in your veins:    Leg swelling:         Pulmonary    Oxygen at home:    Productive cough:     Wheezing:  Neurologic    Sudden weakness in arms or legs:     Sudden numbness in arms or legs:     Sudden onset of difficulty speaking or slurred speech:    Temporary loss of vision in one eye:     Problems with dizziness:         Gastrointestinal    Blood in stool:     Vomited blood:         Genitourinary    Burning when urinating:     Blood in urine:        Psychiatric    Major depression:         Hematologic    Bleeding problems:    Problems with blood clotting too easily:        Skin    Rashes or ulcers:        Constitutional    Fever or chills:      PHYSICAL EXAM: Vitals:   10/01/22 1205  BP: 135/63  Pulse: 70  Resp: 18  Temp: 97.8 F (36.6 C)  TempSrc: Temporal  SpO2: 91%  Weight: 161 lb (73 kg)  Height: 5' 10.5" (1.791 m)    GENERAL: The patient is a well-nourished male, in no acute distress. The vital signs are documented above. CARDIAC: There is a regular rate and rhythm.  VASCULAR:  Palpable radial and brachial pulses bilateral upper extremities PULMONARY: No respiratory distress. ABDOMEN: Soft and non-tender. MUSCULOSKELETAL: There are no major deformities or cyanosis. NEUROLOGIC: No focal weakness or paresthesias are detected. SKIN: There are no ulcers or rashes noted. PSYCHIATRIC: The patient has a normal affect.  DATA:   Vein mapping shows thrombus in the right arm cephalic vein at the antecubital fossa  Arterial duplex shows triphasic waveforms in both upper extremities  Assessment/Plan:  81 y.o. 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  Discussed placement of access in the nondominant arm which would be his left arm.  On exam he has a very nice cephalic vein and likely would be a brachiocephalic.  There is thrombus in the right arm cephalic vein.  I discussed this taking up to 3 months to mature after placement.  Discussed this being done as an outpatient at [facility].  Risk benefits discussed.  I offered to get his surgery scheduled today and family will call back and schedule once they discuss with Dr. Royce Macadamia with [facility].   Marty Heck, MD [facility] Office: [phone]

## 2022-10-02 ENCOUNTER — Other Ambulatory Visit: Payer: Self-pay

## 2022-10-02 DIAGNOSIS — N184 Chronic kidney disease, stage 4 (severe): Secondary | ICD-10-CM

## 2022-10-14 ENCOUNTER — Other Ambulatory Visit: Payer: Self-pay

## 2022-10-14 ENCOUNTER — Encounter (HOSPITAL_COMMUNITY): Payer: Self-pay | Admitting: Vascular Surgery

## 2022-10-14 DIAGNOSIS — N189 Chronic kidney disease, unspecified: Secondary | ICD-10-CM | POA: Diagnosis not present

## 2022-10-14 DIAGNOSIS — N184 Chronic kidney disease, stage 4 (severe): Secondary | ICD-10-CM | POA: Diagnosis not present

## 2022-10-14 NOTE — Progress Notes (Addendum)
[name] wife Jeremy [name] answered the phone and is a designated person to speak with (patient is hard of hearing.) [name] states is that [name] has not complained of shortness or chest pain.  Patient denies having any s/s of Covid in her household, also denies any known exposure to Covid.  [name] was discharged from [facility] with oxygen spoke with his PCP, Dr. Champ Mungo, who instructed patient to try it at daytime, when you are sleeping or if you feel like you need it. AT  discharge from [facility] patient had instructions to see PCP  in 1-2 weeks and to have a chest x-ray, Dr. Zigmund Daniel is aware. [name] appointment is  December 14. [name] has sleep apnea, he does not tolerate the mask for the CPAP. Mrs Jeremy [name] is concerned about patient having anesthesia, due to Mr.Jeremy [name]'s need for oxygen. I asked Jeneen Rinks burns to review the chart.

## 2022-10-15 NOTE — Anesthesia Preprocedure Evaluation (Signed)
Anesthesia Evaluation  Patient identified by MRN, date of birth, ID band Patient awake    Reviewed: Allergy & Precautions, NPO status , Patient's Chart, lab work & pertinent test results, reviewed documented beta blocker date and time   History of Anesthesia Complications Negative for: history of anesthetic complications  Airway Mallampati: II  TM Distance: >3 FB Neck ROM: Full    Dental  (+) Missing   Pulmonary sleep apnea , COPD,  COPD inhaler and oxygen dependent, Current Smoker and Patient abstained from smoking.   Pulmonary exam normal        Cardiovascular hypertension, Pt. on medications and Pt. on home beta blockers + Peripheral Vascular Disease  Normal cardiovascular exam     Neuro/Psych negative neurological ROS     GI/Hepatic negative GI ROS, Neg liver ROS,,,  Endo/Other  negative endocrine ROS    Renal/GU Renal Insufficiency, CRF and ESRFRenal disease (Cr 9.5, BUN >130, K 4.5)  negative genitourinary   Musculoskeletal  (+) Arthritis ,    Abdominal   Peds  Hematology  (+) Blood dyscrasia (Hgb 8.2), anemia   Anesthesia Other Findings Day of surgery medications reviewed with patient.  Reproductive/Obstetrics negative OB ROS                             Anesthesia Physical Anesthesia Plan  ASA: 4  Anesthesia Plan: MAC and Regional   Post-op Pain Management: Tylenol PO (pre-op)*   Induction:   PONV Risk Score and Plan: 0 and Treatment may vary due to age or medical condition, Propofol infusion and Ondansetron  Airway Management Planned: Natural Airway  Additional Equipment: None  Intra-op Plan:   Post-operative Plan:   Informed Consent: I have reviewed the patients History and Physical, chart, labs and discussed the procedure including the risks, benefits and alternatives for the proposed anesthesia with the patient or authorized representative who has indicated  his/her understanding and acceptance.       Plan Discussed with: CRNA  Anesthesia Plan Comments: (PAT note by Karoline Caldwell, PA-C:  Pertinent history includes HTN, HLD, current smoker with associated COPD, OSA not on CPAP, bladder cancer (s/p excision), self-catheterization, stage IV/V CKD not on HD, anemia.  Recent admission 11/2 through 09/17/2022 for AKI on CKD stage IV and acute hypoxic respiratory failure secondary to COPD exacerbation.  He was noted to have baseline creatinine 2.9 with a rapid increase over the past few weeks, 7.8 on admission.  Creatinine 7.13 on discharge.  He also had new oxygen requirement during hospital stay, significantly elevated BNP at 1325, improved with IV Lasix.  He was discharged on Lasix 40 mg oral twice daily, then 40 once daily.  He was arranged for home oxygen.  Per follow-up telephone call with his PCP Dr. [name] on 09/25/2022, he was recommended continue supplemental O2 2 L a minute continuously.  Patient seen by nephrologist Dr. Reeves Dam at [facility] on 09/27/2022.  At that time it was noted his Lasix had been reduced to daily.  Creatinine at that visit was 6.42.  He was referred to vascular surgery for permanent dialysis access.  Patient will need day of surgery labs and evaluation.  EKG 09/12/22: Sinus rhythm.  Rate 94.  PACs.  LAD.  Probable anteroseptal infarct, old.  )        Anesthesia Quick Evaluation

## 2022-10-15 NOTE — Progress Notes (Signed)
Anesthesia Chart Review: Same day workup  Pertinent history includes HTN, HLD, current smoker with associated COPD, OSA not on CPAP, bladder cancer (s/p excision), self-catheterization, stage IV/V CKD not on HD, anemia.  Recent admission 11/2 through 09/17/2022 for AKI on CKD stage IV and acute hypoxic respiratory failure secondary to COPD exacerbation.  He was noted to have baseline creatinine 2.9 with a rapid increase over the past few weeks, 7.8 on admission.  Creatinine 7.13 on discharge.  He also had new oxygen requirement during hospital stay, significantly elevated BNP at 1325, improved with IV Lasix.  He was discharged on Lasix 40 mg oral twice daily, then 40 once daily.  He was arranged for home oxygen.  Per follow-up telephone call with his PCP Dr. [name] on 09/25/2022, he was recommended continue supplemental O2 2 L a minute continuously.  Patient seen by nephrologist Dr. Reeves Dam at [facility] on 09/27/2022.  At that time it was noted his Lasix had been reduced to daily.  Creatinine at that visit was 6.42.  He was referred to vascular surgery for permanent dialysis access.  Patient will need day of surgery labs and evaluation.  EKG 09/12/22: Sinus rhythm.  Rate 94.  PACs.  LAD.  Probable anteroseptal infarct, old.    Wynonia Musty [facility] Short Stay [facility]/Anesthesiology Phone [phone] 10/15/2022 3:49 PM

## 2022-10-16 ENCOUNTER — Ambulatory Visit (HOSPITAL_BASED_OUTPATIENT_CLINIC_OR_DEPARTMENT_OTHER): Payer: Medicare Other | Admitting: Physician Assistant

## 2022-10-16 ENCOUNTER — Encounter (HOSPITAL_COMMUNITY): Payer: Self-pay | Admitting: Vascular Surgery

## 2022-10-16 ENCOUNTER — Ambulatory Visit (HOSPITAL_COMMUNITY): Payer: Medicare Other | Admitting: Physician Assistant

## 2022-10-16 ENCOUNTER — Encounter (HOSPITAL_COMMUNITY)
Admission: RE | Disposition: A | Discharge status: Home / Self Care | Payer: Self-pay | Source: Home / Self Care | Attending: Vascular Surgery

## 2022-10-16 ENCOUNTER — Ambulatory Visit (HOSPITAL_COMMUNITY)
Admission: RE | Admit: 2022-10-16 | Discharge: 2022-10-16 | Disposition: A | Discharge status: Home / Self Care | Payer: Medicare Other | Attending: Vascular Surgery | Admitting: Vascular Surgery

## 2022-10-16 ENCOUNTER — Other Ambulatory Visit: Payer: Self-pay

## 2022-10-16 DIAGNOSIS — F17200 Nicotine dependence, unspecified, uncomplicated: Secondary | ICD-10-CM | POA: Diagnosis not present

## 2022-10-16 DIAGNOSIS — J449 Chronic obstructive pulmonary disease, unspecified: Secondary | ICD-10-CM | POA: Insufficient documentation

## 2022-10-16 DIAGNOSIS — D631 Anemia in chronic kidney disease: Secondary | ICD-10-CM | POA: Diagnosis not present

## 2022-10-16 DIAGNOSIS — I739 Peripheral vascular disease, unspecified: Secondary | ICD-10-CM | POA: Diagnosis not present

## 2022-10-16 DIAGNOSIS — N184 Chronic kidney disease, stage 4 (severe): Secondary | ICD-10-CM | POA: Insufficient documentation

## 2022-10-16 DIAGNOSIS — I129 Hypertensive chronic kidney disease with stage 1 through stage 4 chronic kidney disease, or unspecified chronic kidney disease: Secondary | ICD-10-CM | POA: Insufficient documentation

## 2022-10-16 DIAGNOSIS — Z9981 Dependence on supplemental oxygen: Secondary | ICD-10-CM | POA: Diagnosis not present

## 2022-10-16 DIAGNOSIS — M1990 Unspecified osteoarthritis, unspecified site: Secondary | ICD-10-CM | POA: Diagnosis not present

## 2022-10-16 DIAGNOSIS — N179 Acute kidney failure, unspecified: Secondary | ICD-10-CM

## 2022-10-16 DIAGNOSIS — G4730 Sleep apnea, unspecified: Secondary | ICD-10-CM | POA: Insufficient documentation

## 2022-10-16 DIAGNOSIS — F17210 Nicotine dependence, cigarettes, uncomplicated: Secondary | ICD-10-CM | POA: Diagnosis not present

## 2022-10-16 HISTORY — DX: Personal history of other medical treatment: Z92.89

## 2022-10-16 HISTORY — PX: AV FISTULA PLACEMENT: SHX1204

## 2022-10-16 HISTORY — DX: Anemia, unspecified: D64.9

## 2022-10-16 HISTORY — DX: Chronic kidney disease, unspecified: N18.9

## 2022-10-16 LAB — POCT I-STAT, CHEM 8
BUN: >130 mg/dL — ABNORMAL HIGH (ref 8–23)
Calcium, Ion: 1.14 mmol/L — ABNORMAL LOW (ref 1.15–1.40)
Chloride: 111 mmol/L (ref 98–111)
Creatinine, Ser: 9.5 mg/dL — ABNORMAL HIGH (ref 0.61–1.24)
Glucose, Bld: 88 mg/dL (ref 70–99)
HCT: 24 % — ABNORMAL LOW (ref 39.0–52.0)
Hemoglobin: 8.2 g/dL — ABNORMAL LOW (ref 13.0–17.0)
Potassium: 4.5 mmol/L (ref 3.5–5.1)
Sodium: 142 mmol/L (ref 135–145)
TCO2: 18 mmol/L — ABNORMAL LOW (ref 22–32)

## 2022-10-16 SURGERY — ARTERIOVENOUS (AV) FISTULA CREATION
Anesthesia: Monitor Anesthesia Care | Site: Arm Upper | Laterality: Left

## 2022-10-16 MED ORDER — CHLORHEXIDINE GLUCONATE 4 % EX LIQD: 60.0000 mL | Freq: Once | CUTANEOUS | Status: DC

## 2022-10-16 MED ORDER — HEPARIN 6000 UNIT IRRIGATION SOLUTION
Status: DC | PRN
  Administered 2022-10-16: 1

## 2022-10-16 MED ORDER — TRAMADOL HCL 50 MG PO TABS: 50.0000 mg | ORAL_TABLET | Freq: Four times a day (QID) | ORAL | 0 refills | Status: DC | PRN

## 2022-10-16 MED ORDER — LIDOCAINE-EPINEPHRINE (PF) 1 %-1:200000 IJ SOLN
INTRAMUSCULAR | Status: AC
  Filled 2022-10-16: qty 30

## 2022-10-16 MED ORDER — LIDOCAINE 2% (20 MG/ML) 5 ML SYRINGE
INTRAMUSCULAR | Status: DC | PRN
  Administered 2022-10-16: 60 mg via INTRAVENOUS

## 2022-10-16 MED ORDER — LIDOCAINE 2% (20 MG/ML) 5 ML SYRINGE
INTRAMUSCULAR | Status: AC
  Filled 2022-10-16: qty 5

## 2022-10-16 MED ORDER — 0.9 % SODIUM CHLORIDE (POUR BTL) OPTIME
TOPICAL | Status: DC | PRN
  Administered 2022-10-16: 1000 mL

## 2022-10-16 MED ORDER — HEPARIN 6000 UNIT IRRIGATION SOLUTION
Status: AC
  Filled 2022-10-16: qty 500

## 2022-10-16 MED ORDER — PROPOFOL 10 MG/ML IV BOLUS
INTRAVENOUS | Status: AC
  Filled 2022-10-16: qty 20

## 2022-10-16 MED ORDER — PROPOFOL 500 MG/50ML IV EMUL
INTRAVENOUS | Status: DC | PRN
  Administered 2022-10-16: 50 ug/kg/min via INTRAVENOUS

## 2022-10-16 MED ORDER — VANCOMYCIN HCL IN DEXTROSE 1-5 GM/200ML-% IV SOLN
1000.0000 mg | INTRAVENOUS | Status: AC
  Administered 2022-10-16: 1000 mg via INTRAVENOUS
  Filled 2022-10-16: qty 200

## 2022-10-16 MED ORDER — EPHEDRINE SULFATE-NACL 50-0.9 MG/10ML-% IV SOSY
PREFILLED_SYRINGE | INTRAVENOUS | Status: DC | PRN
  Administered 2022-10-16 (×4): 5 mg via INTRAVENOUS

## 2022-10-16 MED ORDER — CHLORHEXIDINE GLUCONATE 0.12 % MT SOLN
OROMUCOSAL | Status: AC
  Administered 2022-10-16: 15 mL
  Filled 2022-10-16: qty 15

## 2022-10-16 MED ORDER — PHENYLEPHRINE HCL-NACL 20-0.9 MG/250ML-% IV SOLN
INTRAVENOUS | Status: DC | PRN
  Administered 2022-10-16: 25 ug/min via INTRAVENOUS

## 2022-10-16 MED ORDER — SODIUM CHLORIDE 0.9 % IV SOLN: INTRAVENOUS | Status: DC

## 2022-10-16 MED ORDER — ONDANSETRON HCL 4 MG/2ML IJ SOLN
INTRAMUSCULAR | Status: DC | PRN
  Administered 2022-10-16: 4 mg via INTRAVENOUS

## 2022-10-16 MED ORDER — EPHEDRINE 5 MG/ML INJ
INTRAVENOUS | Status: AC
  Filled 2022-10-16: qty 5

## 2022-10-16 MED ORDER — HEPARIN SODIUM (PORCINE) 1000 UNIT/ML IJ SOLN
INTRAMUSCULAR | Status: DC | PRN
  Administered 2022-10-16: 3000 [IU] via INTRAVENOUS

## 2022-10-16 MED ORDER — MEPIVACAINE HCL (PF) 2 % IJ SOLN
INTRAMUSCULAR | Status: DC | PRN
  Administered 2022-10-16: 30 mL

## 2022-10-16 MED ORDER — EPINEPHRINE PF 1 MG/ML IJ SOLN
INTRAMUSCULAR | Status: DC | PRN
  Administered 2022-10-16: .15 mg via SUBCUTANEOUS

## 2022-10-16 MED ORDER — ACETAMINOPHEN 500 MG PO TABS
1000.0000 mg | ORAL_TABLET | Freq: Once | ORAL | Status: AC
  Administered 2022-10-16: 1000 mg via ORAL
  Filled 2022-10-16: qty 2

## 2022-10-16 MED ORDER — STERILE WATER FOR IRRIGATION IR SOLN
Status: DC | PRN
  Administered 2022-10-16: 1000 mL

## 2022-10-16 MED ORDER — FENTANYL CITRATE (PF) 100 MCG/2ML IJ SOLN
INTRAMUSCULAR | Status: AC
  Filled 2022-10-16: qty 2

## 2022-10-16 MED ORDER — FENTANYL CITRATE (PF) 100 MCG/2ML IJ SOLN: 25.0000 ug | INTRAMUSCULAR | Status: DC | PRN

## 2022-10-16 SURGICAL SUPPLY — 34 items
ARMBAND PINK RESTRICT EXTREMIT (MISCELLANEOUS) ×2 IMPLANT
BAG COUNTER SPONGE SURGICOUNT (BAG) ×1 IMPLANT
BLADE CLIPPER SURG (BLADE) ×1 IMPLANT
CANISTER SUCT 3000ML PPV (MISCELLANEOUS) ×1 IMPLANT
CLIP VESOCCLUDE MED 6/CT (CLIP) ×1 IMPLANT
CLIP VESOCCLUDE SM WIDE 6/CT (CLIP) ×1 IMPLANT
COVER PROBE W GEL 5X96 (DRAPES) ×1 IMPLANT
DERMABOND ADVANCED .7 DNX12 (GAUZE/BANDAGES/DRESSINGS) ×1 IMPLANT
DRESSING MEPILEX FLEX 4X4 (GAUZE/BANDAGES/DRESSINGS) IMPLANT
DRSG MEPILEX FLEX 4X4 (GAUZE/BANDAGES/DRESSINGS) ×1 IMPLANT
ELECT REM PT RETURN 9FT ADLT (ELECTROSURGICAL) ×1 IMPLANT
ELECTRODE REM PT RTRN 9FT ADLT (ELECTROSURGICAL) ×1 IMPLANT
GLOVE BIO SURGEON STRL SZ7.5 (GLOVE) ×1 IMPLANT
GLOVE BIOGEL PI IND STRL 6.5 (GLOVE) IMPLANT
GLOVE BIOGEL PI IND STRL 8 (GLOVE) ×1 IMPLANT
GOWN STRL REUS W/ TWL LRG LVL3 (GOWN DISPOSABLE) ×2 IMPLANT
GOWN STRL REUS W/ TWL XL LVL3 (GOWN DISPOSABLE) ×2 IMPLANT
GOWN STRL REUS W/TWL LRG LVL3 (GOWN DISPOSABLE) ×2
GOWN STRL REUS W/TWL XL LVL3 (GOWN DISPOSABLE) ×2
KIT BASIN OR (CUSTOM PROCEDURE TRAY) ×1 IMPLANT
KIT TURNOVER KIT B (KITS) ×1 IMPLANT
LOOP VESSEL MINI RED (MISCELLANEOUS) IMPLANT
NS IRRIG 1000ML POUR BTL (IV SOLUTION) ×1 IMPLANT
PACK CV ACCESS (CUSTOM PROCEDURE TRAY) ×1 IMPLANT
PAD ARMBOARD 7.5X6 YLW CONV (MISCELLANEOUS) ×2 IMPLANT
SLING ARM FOAM STRAP LRG (SOFTGOODS) IMPLANT
SLING ARM FOAM STRAP MED (SOFTGOODS) IMPLANT
SUT MNCRL AB 4-0 PS2 18 (SUTURE) ×1 IMPLANT
SUT PROLENE 6 0 BV (SUTURE) ×1 IMPLANT
SUT VIC AB 3-0 SH 27 (SUTURE) ×2
SUT VIC AB 3-0 SH 27X BRD (SUTURE) ×1 IMPLANT
TOWEL GREEN STERILE (TOWEL DISPOSABLE) ×1 IMPLANT
UNDERPAD 30X36 HEAVY ABSORB (UNDERPADS AND DIAPERS) ×1 IMPLANT
WATER STERILE IRR 1000ML POUR (IV SOLUTION) ×1 IMPLANT

## 2022-10-16 NOTE — Discharge Instructions (Signed)
° °  [facility] ° °Discharge Instructions ° °AV Fistula or Graft Surgery for Dialysis Access ° °Please refer to the following instructions for your post-procedure care. Your surgeon or physician assistant will discuss any changes with you. ° °Activity ° °You may drive the day following your surgery, if you are comfortable and no longer taking prescription pain medication. Resume full activity as the soreness in your incision resolves. ° °Bathing/Showering ° °You may shower after you go home. Keep your incision dry for 48 hours. Do not soak in a bathtub, hot tub, or swim until the incision heals completely. You may not shower if you have a hemodialysis catheter. ° °Incision Care ° °Clean your incision with mild soap and water after 48 hours. Pat the area dry with a clean towel. You do not need a bandage unless otherwise instructed. Do not apply any ointments or creams to your incision. You may have skin glue on your incision. Do not peel it off. It will come off on its own in about one week. Your arm may swell a bit after surgery. To reduce swelling use pillows to elevate your arm so it is above your heart. Your doctor will tell you if you need to lightly wrap your arm with an ACE bandage. ° °Diet ° °Resume your normal diet. There are not special food restrictions following this procedure. In order to heal from your surgery, it is CRITICAL to get adequate nutrition. Your body requires vitamins, minerals, and protein. Vegetables are the best source of vitamins and minerals. Vegetables also provide the perfect balance of protein. Processed food has little nutritional value, so try to avoid this. ° °Medications ° °Resume taking all of your medications. If your incision is causing pain, you may take over-the counter pain relievers such as acetaminophen (Tylenol). If you were prescribed a stronger pain medication, please be aware these medications can cause nausea and constipation. Prevent  nausea by taking the medication with a snack or meal. Avoid constipation by drinking plenty of fluids and eating foods with high amount of fiber, such as fruits, vegetables, and grains. Do not take Tylenol if you are taking prescription pain medications. ° ° ° ° °Follow up °Your surgeon may want to see you in the office following your access surgery. If so, this will be arranged at the time of your surgery. ° °Please call us immediately for any of the following conditions: ° °Increased pain, redness, drainage (pus) from your incision site °Fever of 101 degrees or higher °Severe or worsening pain at your incision site °Hand pain or numbness. ° °Reduce your risk of vascular disease: ° °Stop smoking. If you would like help, call [facility] at 1-800-QUIT-NOW (1-800-784-8669) or  at 336-586-4000 ° °Manage your cholesterol °Maintain a desired weight °Control your diabetes °Keep your blood pressure down ° °Dialysis ° °It will take several weeks to several months for your new dialysis access to be ready for use. Your surgeon will determine when it is OK to use it. Your nephrologist will continue to direct your dialysis. You can continue to use your Permcath until your new access is ready for use. ° °If you have any questions, please call the office at [phone]. ° °

## 2022-10-16 NOTE — Op Note (Signed)
OPERATIVE NOTE   PROCEDURE: left brachiocephalic arteriovenous fistula placement  PRE-OPERATIVE DIAGNOSIS: AKI on chronic kidney disease stage IV  POST-OPERATIVE DIAGNOSIS: same as above   SURGEON: Marty Heck, MD  ASSISTANT(S): Roxy Horseman, PA  ANESTHESIA: Regional   ESTIMATED BLOOD LOSS: Minimal  FINDING(S): 1.  Cephalic vein: 4 mm, acceptable 2.  Brachial artery: 5 mm, atherosclerotic disease evident 3.  Venous outflow: palpable thrill  4.  Radial flow: palpable radial pulse  SPECIMEN(S):  none  INDICATIONS:   Jeremy [name] is a 81 y.o. male who presents with AKI on stage IV CKD.  The patient is scheduled for left arm arteriovenous fistula placement.  The patient is aware the risks include but are not limited to: bleeding, infection, steal syndrome, nerve damage, ischemic monomelic neuropathy, failure to mature, and need for additional procedures.  The patient is aware of the risks of the procedure and elects to proceed forward.   DESCRIPTION: After full informed written consent was obtained from the patient, the patient was brought back to the operating room and placed supine upon the operating table.  Prior to induction, the patient received IV antibiotics.   After obtaining adequate anesthesia, the patient was then prepped and draped in the standard fashion for a left arm access procedure.  I turned my attention first to identifying the patient's cephalic vein and brachial artery.  Using SonoSite guidance, the location of these vessels were marked out on the skin.  These looked to be of excellent caliber just below the antecubital fossa.    I made a transverse incision at the level of the antecubitum and dissected through the subcutaneous tissue and fascia to gain exposure of the brachial artery.  This was noted to be 5 mm in diameter externally.  This was dissected out proximally and distally and controlled with vessel loops .  I then dissected out the  cephalic vein.  This was noted to be 4 mm in diameter externally.  The distal segment of the vein was ligated with a  2-0 silk, and the vein was transected.  The proximal segment was interrogated with serial dilators.  The vein accepted up to a 5 mm dilator without any difficulty.  I then instilled the heparinized saline into the vein and clamped it.  At this point, I reset my exposure of the brachial artery.  The patient was given 3,000 units of IV heparin.  I then placed the artery under tension proximally and distally.  I made an arteriotomy with a #11 blade, and then I extended the arteriotomy with a Potts scissor.  I injected heparinized saline proximal and distal to this arteriotomy.  The vein was then sewn to the artery in an end-to-side configuration with a running stitch of 6-0 Prolene.  Prior to completing this anastomosis, I allowed the vein and artery to backbleed.  There was no evidence of clot from any vessels.  I completed the anastomosis in the usual fashion and then released all vessel loops and clamps.    There was a palpable thrill in the venous outflow, and there was a palpable radial pulse.  At this point, I irrigated out the surgical wound.  There was no further active bleeding.  The subcutaneous tissue was reapproximated with a running stitch of 3-0 Vicryl.  The skin was then reapproximated with a running subcuticular stitch of 4-0 Monocryl.  The skin was then cleaned, dried, and reinforced with Dermabond.  The patient tolerated this procedure well.  COMPLICATIONS: None  CONDITION: Stable  Marty Heck, MD [facility] Office: [place]   10/16/2022, 11:28 AM

## 2022-10-16 NOTE — Anesthesia Procedure Notes (Signed)
Procedure Name: MAC Date/Time: 10/16/2022 10:23 AM  Performed by: Rande Brunt, CRNAPre-anesthesia Checklist: Patient identified, Emergency Drugs available, Suction available and Patient being monitored Patient Re-evaluated:Patient Re-evaluated prior to induction Oxygen Delivery Method: Simple face mask Preoxygenation: Pre-oxygenation with 100% oxygen Induction Type: IV induction Placement Confirmation: positive ETCO2 and CO2 detector Dental Injury: Teeth and Oropharynx as per pre-operative assessment

## 2022-10-16 NOTE — Transfer of Care (Signed)
Immediate Anesthesia Transfer of Care Note  Patient: Jeremy [name]  Procedure(s) Performed: LEFT BRACHIOCEPHALIC ARTERIOVENOUS (AV) FISTULA CREATION (Left: Arm Upper)  Patient Location: PACU  Anesthesia Type:MAC combined with regional for post-op pain  Level of Consciousness: awake, drowsy, and patient cooperative  Airway & Oxygen Therapy: Patient Spontanous Breathing and Patient connected to nasal cannula oxygen  Post-op Assessment: Report given to RN, Post -op Vital signs reviewed and stable, and Patient moving all extremities X 4  Post vital signs: Reviewed and stable  Last Vitals:  Vitals Value Taken Time  BP 98/43 10/16/22 1137  Temp    Pulse 71 10/16/22 1139  Resp 14 10/16/22 1139  SpO2 89 % 10/16/22 1139  Vitals shown include unvalidated device data.  Last Pain:  Vitals:   10/16/22 0807  TempSrc:   PainSc: 0-No pain      Patients Stated Pain Goal: 0 (22/29/79 8921)  Complications: No notable events documented.

## 2022-10-16 NOTE — Anesthesia Procedure Notes (Signed)
Anesthesia Regional Block: Supraclavicular block   Pre-Anesthetic Checklist: , timeout performed,  Correct Patient, Correct Site, Correct Laterality,  Correct Procedure, Correct Position, site marked,  Risks and benefits discussed,  Pre-op evaluation,  At surgeon's request and post-op pain management  Laterality: Left  Prep: Maximum Sterile Barrier Precautions used, chloraprep       Needles:  Injection technique: Single-shot  Needle Type: Echogenic Stimulator Needle     Needle Length: 9cm  Needle Gauge: 22     Additional Needles:   Procedures:,,,, ultrasound used (permanent image in chart),,    Narrative:  Start time: 10/16/2022 9:56 AM End time: 10/16/2022 9:59 AM Injection made incrementally with aspirations every 5 mL.  Performed by: Personally  Anesthesiologist: Brennan Bailey, MD  Additional Notes: Risks, benefits, and alternative discussed. Patient gave consent for procedure. Patient prepped and draped in sterile fashion. Sedation administered, patient remains easily responsive to voice. Relevant anatomy identified with ultrasound guidance. Local anesthetic given in 5cc increments with no signs or symptoms of intravascular injection. No pain or paraesthesias with injection. Patient monitored throughout procedure with signs of LAST or immediate complications. Tolerated well. Ultrasound image placed in chart.  Tawny Asal, MD

## 2022-10-16 NOTE — Anesthesia Postprocedure Evaluation (Signed)
Anesthesia Post Note  Patient: Jeremy [name]  Procedure(s) Performed: LEFT BRACHIOCEPHALIC ARTERIOVENOUS (AV) FISTULA CREATION (Left: Arm Upper)     Patient location during evaluation: PACU Anesthesia Type: Regional Level of consciousness: awake and alert Pain management: pain level controlled Vital Signs Assessment: post-procedure vital signs reviewed and stable Respiratory status: spontaneous breathing, nonlabored ventilation and respiratory function stable Cardiovascular status: blood pressure returned to baseline Postop Assessment: no apparent nausea or vomiting Anesthetic complications: no   No notable events documented.  Last Vitals:  Vitals:   10/16/22 1215 10/16/22 1225  BP: (!) 108/55 109/63  Pulse: 68 60  Resp: 16 14  Temp:  36.4 C  SpO2: 90% 91%    Last Pain:  Vitals:   10/16/22 1225  TempSrc:   PainSc: 0-No pain                 Marthenia Rolling

## 2022-10-16 NOTE — H&P (Signed)
History and Physical Interval Note:  10/16/2022 10:08 AM  Javier Docker  has presented today for surgery, with the diagnosis of Chronic Kidney Disease Stage 4.  The various methods of treatment have been discussed with the patient and family. After consideration of risks, benefits and other options for treatment, the patient has consented to  Procedure(s): LEFT ARM ARTERIOVENOUS (AV) FISTULA CREATION (Left) as a surgical intervention.  The patient's history has been reviewed, patient examined, no change in status, stable for surgery.  I have reviewed the patient's chart and labs.  Questions were answered to the patient's satisfaction.    Left arm AVF  Marty Heck        Patient name: JUNIPER SNYDERS            MRN: [id number]        DOB: [birth year]          Sex: male   REASON FOR CONSULT: Access placement for HD   HPI: DOMENIK TRICE is a 81 y.o. 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  Patient is not on dialysis at this time.  He is right-handed.  No previous access in the past.  He was recently hospitalized and discharged on 09/17/2022 for worsening renal function.  He has follow-up with his nephrologist in several weeks with Dr. Royce Macadamia.  His wife states nephrology feels he will need dialysis soon.  He is retired from the [employer].       Past Medical History:  Diagnosis Date   Cancer [facility])     COPD (chronic obstructive pulmonary disease) ([place])     Enlarged prostate     Hyperlipidemia     Hypertension     Self-catheterizes urinary bladder      pt. does this morning and evening--been doing this since 11/2016   Sleep apnea      not using a cpap machine           Past Surgical History:  Procedure Laterality Date   BACK SURGERY       BLADDER TUMOR EXCISION   2015    benign   BLEPHAROPLASTY   12/2018   LUMBAR LAMINECTOMY/DECOMPRESSION MICRODISCECTOMY Bilateral 08/25/2017    Procedure:  Bilateral Lumbar Three- Four, Lumbar Four- Five, Lumbar Five- Sacral One Laminectomy;  Surgeon: Kristeen Miss, MD;  Location: [facility];  Service: Neurosurgery;  Laterality: Bilateral;  Bilateral L3-4 L4-5 L5-S1 Laminectomy   SKIN CANCER EXCISION        Surg x2...   TONSILLECTOMY AND ADENOIDECTOMY        Surg as a child...   TRANSURETHRAL RESECTION OF BLADDER TUMOR N/A 01/03/2022    Procedure: TRANSURETHRAL RESECTION OF BLADDER TUMOR (TURBT)/ CYSTOSCOPY/ EXAM UNDER ANESTHESIA, BILATERAL RETROGRADE/ BLADDER BIOPSIES;  Surgeon: Raynelle Bring, MD;  Location: [facility];  Service: Urology;  Laterality: N/A;  GENERAL ANESTHESIA WITH PARALYSIS           Family History  Problem Relation Age of Onset   Colon cancer Neg Hx     Stomach cancer Neg Hx     Rectal cancer Neg Hx        SOCIAL HISTORY: Social History         Socioeconomic History   Marital status: Married      Spouse name: Not on file   Number of children: Not on file   Years of education: Not on file  Highest education level: Not on file  Occupational History   Not on file  Tobacco Use   Smoking status: Every Day      Packs/day: 0.50      Years: 50.00      Total pack years: 25.00      Types: Cigarettes      Start date: 12/29/1961   Smokeless tobacco: Never   Tobacco comments:      Smokes 1/2 pack a day   Vaping Use   Vaping Use: Never used  Substance and Sexual Activity   Alcohol use: Yes      Comment: couple glasses with dinner daily   Drug use: No   Sexual activity: Not on file  Other Topics Concern   Not on file  Social History Narrative    Lives with wife in a 2 story home.  Has 2 children.  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, commissioning)    Social Determinants of Health        Financial Resource Strain: Not on file  Food Insecurity: No Food Insecurity (09/18/2022)    Hunger Vital Sign     Worried About Running Out of Food in the Last Year: Never true     Ran Out of Food in the Last Year: Never true   Transportation Needs: No Transportation Needs (09/18/2022)    PRAPARE - Armed forces logistics/support/administrative officer (Medical): No     Lack of Transportation (Non-Medical): No  Physical Activity: Not on file  Stress: Not on file  Social Connections: Not on file  Intimate Partner Violence: Not At Risk (09/12/2022)    Humiliation, Afraid, Rape, and Kick questionnaire     Fear of Current or Ex-Partner: No     Emotionally Abused: No     Physically Abused: No     Sexually Abused: No           Allergies  Allergen Reactions   Sulfa Antibiotics        Rash, light headed, shaking   Penicillins Other (See Comments)      hives severe as a child              Current Outpatient Medications  Medication Sig Dispense Refill   albuterol (PROVENTIL) (2.5 MG/3ML) 0.083% nebulizer solution Inhale 3 mLs (2.5 mg total) by nebulization every 4 (four) hours as needed for wheezing or shortness of breath. 90 mL 12   amLODipine (NORVASC) 10 MG tablet TAKE 1 TABLET EVERY DAY (Patient taking differently: Take 10 mg by mouth every evening.) 90 tablet 3   Camphor-Eucalyptus-Menthol (VICKS VAPORUB EX) Apply 1 application topically at bedtime as needed (congestion).       cholecalciferol (VITAMIN D3) 25 MCG (1000 UNIT) tablet Take 1,000 Units by mouth daily.       diphenhydrAMINE-zinc acetate (BENADRYL) cream Apply 1 Application topically daily as needed for itching.       furosemide (LASIX) 40 MG tablet Take 1 tablet (40 mg) by mouth twice daily  for 3 days, then 1 tablet daily 15 tablet 0   guaiFENesin (MUCINEX) 600 MG 12 hr tablet Take 1,200 mg by mouth 2 (two) times daily as needed for cough.       hydrALAZINE (APRESOLINE) 50 MG tablet Take 1 tablet (50 mg total) by mouth 3 (three) times daily. 90 tablet 0   METAMUCIL FIBER PO Take 2 capsules by mouth every evening.       metoprolol succinate (TOPROL-XL) 50 MG 24 hr  tablet TAKE 1 TABLET DAILY WITH OR IMMEDIATELY FOLLOWING A MEAL (Patient taking differently:  Take 50 mg by mouth every evening.) 90 tablet 3   olopatadine (PATANOL) 0.1 % ophthalmic solution Place 1 drop into both eyes daily as needed for dry eyes.       tamsulosin (FLOMAX) 0.4 MG CAPS Take 0.8 mg by mouth at bedtime.       cefdinir (OMNICEF) 300 MG capsule Take 1 capsule (300 mg total) by mouth daily. (Patient not taking: Reported on 10/01/2022) 5 capsule 0   methylPREDNISolone (MEDROL DOSEPAK) 4 MG TBPK tablet Taper as directed on packaging. (Patient not taking: Reported on 10/01/2022) 21 tablet 0   simvastatin (ZOCOR) 40 MG tablet TAKE 1 TABLET EVERY DAY (Patient not taking: Reported on 10/01/2022) 90 tablet 1    No current facility-administered medications for this visit.      REVIEW OF SYSTEMS:  '[X]'$  denotes positive finding, '[ ]'$  denotes negative finding Cardiac   Comments:  Chest pain or chest pressure:      Shortness of breath upon exertion:      Short of breath when lying flat:      Irregular heart rhythm:             Vascular      Pain in calf, thigh, or hip brought on by ambulation:      Pain in feet at night that wakes you up from your sleep:       Blood clot in your veins:      Leg swelling:              Pulmonary      Oxygen at home:      Productive cough:       Wheezing:              Neurologic      Sudden weakness in arms or legs:       Sudden numbness in arms or legs:       Sudden onset of difficulty speaking or slurred speech:      Temporary loss of vision in one eye:       Problems with dizziness:              Gastrointestinal      Blood in stool:       Vomited blood:              Genitourinary      Burning when urinating:       Blood in urine:             Psychiatric      Major depression:              Hematologic      Bleeding problems:      Problems with blood clotting too easily:             Skin      Rashes or ulcers:             Constitutional      Fever or chills:          PHYSICAL EXAM:    Vitals:    10/01/22 1205  BP:  135/63  Pulse: 70  Resp: 18  Temp: 97.8 F (36.6 C)  TempSrc: Temporal  SpO2: 91%  Weight: 161 lb (73 kg)  Height: 5' 10.5" (1.791 m)      GENERAL: The patient is a well-nourished male, in no acute distress. The  vital signs are documented above. CARDIAC: There is a regular rate and rhythm.  VASCULAR:  Palpable radial and brachial pulses bilateral upper extremities PULMONARY: No respiratory distress. ABDOMEN: Soft and non-tender. MUSCULOSKELETAL: There are no major deformities or cyanosis. NEUROLOGIC: No focal weakness or paresthesias are detected. SKIN: There are no ulcers or rashes noted. PSYCHIATRIC: The patient has a normal affect.   DATA:    Vein mapping shows thrombus in the right arm cephalic vein at the antecubital fossa   Arterial duplex shows triphasic waveforms in both upper extremities   Assessment/Plan:   81 y.o. male, with hx HTN, HLD, COPD, bladder cancer and AKI on stage 4 CKD that presents to discuss permanent hemodialysis access.  Discussed placement of access in the nondominant arm which would be his left arm.  On exam he has a very nice cephalic vein and likely would be a brachiocephalic.  There is thrombus in the right arm cephalic vein.  I discussed this taking up to 3 months to mature after placement.  Discussed this being done as an outpatient at [facility].  Risk benefits discussed.  I offered to get his surgery scheduled today and family will call back and schedule once they discuss with Dr. Royce Macadamia with [facility].     Marty Heck, MD [facility] [place] Office: [phone]

## 2022-10-17 ENCOUNTER — Encounter (HOSPITAL_COMMUNITY): Payer: Self-pay | Admitting: Vascular Surgery

## 2022-10-17 DIAGNOSIS — R809 Proteinuria, unspecified: Secondary | ICD-10-CM | POA: Diagnosis not present

## 2022-10-17 DIAGNOSIS — K551 Chronic vascular disorders of intestine: Secondary | ICD-10-CM | POA: Diagnosis not present

## 2022-10-17 DIAGNOSIS — R339 Retention of urine, unspecified: Secondary | ICD-10-CM | POA: Diagnosis not present

## 2022-10-17 DIAGNOSIS — E872 Acidosis, unspecified: Secondary | ICD-10-CM | POA: Diagnosis not present

## 2022-10-17 DIAGNOSIS — E875 Hyperkalemia: Secondary | ICD-10-CM | POA: Diagnosis not present

## 2022-10-17 DIAGNOSIS — I120 Hypertensive chronic kidney disease with stage 5 chronic kidney disease or end stage renal disease: Secondary | ICD-10-CM | POA: Diagnosis not present

## 2022-10-17 DIAGNOSIS — N185 Chronic kidney disease, stage 5: Secondary | ICD-10-CM | POA: Diagnosis not present

## 2022-10-17 DIAGNOSIS — R319 Hematuria, unspecified: Secondary | ICD-10-CM | POA: Diagnosis not present

## 2022-10-18 ENCOUNTER — Telehealth: Payer: Self-pay

## 2022-10-18 DIAGNOSIS — Z23 Encounter for immunization: Secondary | ICD-10-CM | POA: Diagnosis not present

## 2022-10-18 NOTE — Telephone Encounter (Signed)
Pt's wife, Jeremy [name], called asking if the pt was ok to have a Covid vaccination today at 10:30. States his recovery after surgery has been going well.  Spoke with [place], [place] who stated that there was no data against getting the vaccine d/t recent surgery. Called wife, two identifiers used. Relayed PA's response. She stated that the pt's nephrologist also confirmed that it was ok. Will proceed with vaccination. Confirmed understanding.

## 2022-10-23 DIAGNOSIS — N186 End stage renal disease: Secondary | ICD-10-CM | POA: Diagnosis not present

## 2022-10-23 DIAGNOSIS — Z992 Dependence on renal dialysis: Secondary | ICD-10-CM | POA: Diagnosis not present

## 2022-10-24 ENCOUNTER — Ambulatory Visit (INDEPENDENT_AMBULATORY_CARE_PROVIDER_SITE_OTHER): Payer: Medicare Other | Admitting: Family Medicine

## 2022-10-24 ENCOUNTER — Ambulatory Visit (INDEPENDENT_AMBULATORY_CARE_PROVIDER_SITE_OTHER): Payer: Medicare Other

## 2022-10-24 ENCOUNTER — Other Ambulatory Visit: Payer: Self-pay | Admitting: Family Medicine

## 2022-10-24 ENCOUNTER — Encounter: Payer: Self-pay | Admitting: Family Medicine

## 2022-10-24 VITALS — BP 105/47 | HR 66 | Ht 70.5 in | Wt 157.0 lb

## 2022-10-24 DIAGNOSIS — R2681 Unsteadiness on feet: Secondary | ICD-10-CM

## 2022-10-24 DIAGNOSIS — R0600 Dyspnea, unspecified: Secondary | ICD-10-CM

## 2022-10-24 DIAGNOSIS — J411 Mucopurulent chronic bronchitis: Secondary | ICD-10-CM | POA: Diagnosis not present

## 2022-10-24 DIAGNOSIS — N401 Enlarged prostate with lower urinary tract symptoms: Secondary | ICD-10-CM | POA: Diagnosis not present

## 2022-10-24 DIAGNOSIS — N138 Other obstructive and reflux uropathy: Secondary | ICD-10-CM

## 2022-10-24 DIAGNOSIS — R0602 Shortness of breath: Secondary | ICD-10-CM

## 2022-10-24 DIAGNOSIS — J90 Pleural effusion, not elsewhere classified: Secondary | ICD-10-CM | POA: Diagnosis not present

## 2022-10-24 DIAGNOSIS — I10 Essential (primary) hypertension: Secondary | ICD-10-CM | POA: Diagnosis not present

## 2022-10-24 DIAGNOSIS — D509 Iron deficiency anemia, unspecified: Secondary | ICD-10-CM | POA: Diagnosis not present

## 2022-10-24 DIAGNOSIS — N186 End stage renal disease: Secondary | ICD-10-CM | POA: Diagnosis not present

## 2022-10-24 DIAGNOSIS — Z992 Dependence on renal dialysis: Secondary | ICD-10-CM | POA: Diagnosis not present

## 2022-10-24 DIAGNOSIS — E875 Hyperkalemia: Secondary | ICD-10-CM | POA: Diagnosis not present

## 2022-10-24 DIAGNOSIS — D631 Anemia in chronic kidney disease: Secondary | ICD-10-CM | POA: Diagnosis not present

## 2022-10-24 DIAGNOSIS — I509 Heart failure, unspecified: Secondary | ICD-10-CM | POA: Diagnosis not present

## 2022-10-24 NOTE — Assessment & Plan Note (Signed)
Has home o2.  Wanting portable concentrator.  They will let me know who is currently servicing O2.

## 2022-10-24 NOTE — Patient Instructions (Addendum)
  If leukocytes or nitrite are elevated on strip let me know.  Continue home oxygen use.

## 2022-10-24 NOTE — Assessment & Plan Note (Signed)
Recently had fistula created.  Having first dialysis session today through port.  Continue management per nephrology.

## 2022-10-24 NOTE — Assessment & Plan Note (Signed)
BP is a little low. No dizziness or pre-syncopal symptoms.  Hydralazine recently reduced by nephrology.  He will continue to monitor his BP at home.

## 2022-10-24 NOTE — Assessment & Plan Note (Addendum)
Recommend use of assistive device, preferable walker to steady gait and prevent falls. Declines rx for this at this time.

## 2022-10-24 NOTE — Assessment & Plan Note (Addendum)
He continues to self cath.  Wife is concerned about recurrent UTI.  Recommend washing hands before cathing and cleaning around meatus before insertion of catheter.  She will pick up strips to test urine and let me know if having signs of UTI.

## 2022-10-24 NOTE — Progress Notes (Signed)
Jeremy [name] - 81 y.o. male MRN [id number]  Date of birth: 03/08/1941  Subjective Chief Complaint  Patient presents with   Follow-up    HPI Jeremy [name] is a 81 y.o. male here today for follow up.    Recent hospital admission for AKI.  Sent to ED by nephrology due to worsening creatinine.  Losartan held initially.  Restarted back per nephrology on 07/22/22 and increased to '100mg'$  on 07/24/22.  Treated for UTI as he was having some increased confusion.  Seen via video visit with me on 9/21, still with some mild confusion but this was improving with treatment of UTI.  Creatinine trended up for 2.9-->6 on 08/30/22.  Seen again at nephrology on 11/1/ with continued uptrend to 7.36.  Losartan discontinued at this time and sent to ED.  Initially started on bicarb drip.  Doppler of renal arteries without significant stenosis.  Renal function never really improved in the hospital.  Planning initiated for dialysis.  Had AV fistula created in L arm on 12/6 and had dialysis port placed yesterday.  Today he reports that he is feeling a little weakness in his legs.  O2 sats are low. He has chronic home O2, placed on O2 here.  He denies chest pain, palpitations, worsening dyspnea.  He is still making some urine, self caths.  He does have dialysis today.    His wife is concerned about him having recurrent urinary tract infections.  Previous infection found incidentally. No symptoms at this time but often has darker, cloudy urine when he has UTI.  They live and [place] and it is difficult for them to bring in a sample when he has symptoms.   ROS:  A comprehensive ROS was completed and negative except as noted per HPI    Allergies  Allergen Reactions   Sulfa Antibiotics     Rash, light headed, shaking   Penicillins Other (See Comments)    hives severe as a child     Past Medical History:  Diagnosis Date   Anemia    Cancer ([place])    patient and family have not been told that he has cancer.   Chronic  kidney disease    COPD (chronic obstructive pulmonary disease) ([facility])    Enlarged prostate    History of blood transfusion    Hyperlipidemia    Hypertension    Self-catheterizes urinary bladder    pt. does this morning and evening--been doing this since 11/2016   Sleep apnea    not using a cpap machine    Past Surgical History:  Procedure Laterality Date   AV FISTULA PLACEMENT Left 10/16/2022   Procedure: LEFT BRACHIOCEPHALIC ARTERIOVENOUS (AV) FISTULA CREATION;  Surgeon: Marty Heck, MD;  Location: [facility];  Service: Vascular;  Laterality: Left;   BACK SURGERY     BLADDER TUMOR EXCISION  2015   benign   BLEPHAROPLASTY  12/2018   LUMBAR LAMINECTOMY/DECOMPRESSION MICRODISCECTOMY Bilateral 08/25/2017   Procedure: Bilateral Lumbar Three- Four, Lumbar Four- Five, Lumbar Five- Sacral One Laminectomy;  Surgeon: Kristeen Miss, MD;  Location: [place] [facility];  Service: Neurosurgery;  Laterality: Bilateral;  Bilateral L3-4 L4-5 L5-S1 Laminectomy   SKIN CANCER EXCISION     Surg x2...   TONSILLECTOMY AND ADENOIDECTOMY     Surg as a child...   TRANSURETHRAL RESECTION OF BLADDER TUMOR N/A 01/03/2022   Procedure: TRANSURETHRAL RESECTION OF BLADDER TUMOR (TURBT)/ CYSTOSCOPY/ EXAM UNDER ANESTHESIA, BILATERAL RETROGRADE/ BLADDER BIOPSIES;  Surgeon: Raynelle Bring, MD;  Location: [facility];  Service: Urology;  Laterality: N/A;  GENERAL ANESTHESIA WITH PARALYSIS    Social History   Socioeconomic History   Marital status: Married    Spouse name: Not on file   Number of children: Not on file   Years of education: Not on file   Highest education level: Not on file  Occupational History   Not on file  Tobacco Use   Smoking status: Every Day    Packs/day: 0.20    Years: 50.00    Total pack years: 10.00    Types: Cigarettes    Start date: 12/29/1961   Smokeless tobacco: Never   Tobacco comments:    Smokes 1/2 pack a day   Vaping Use   Vaping Use: Never used  Substance and Sexual Activity   Alcohol  use: Yes    Comment: couple glasses with dinner daily   Drug use: No   Sexual activity: Not on file  Other Topics Concern   Not on file  Social History Narrative   Lives with wife in a 2 story home.  Has 2 children.  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, commissioning)   Social Determinants of Health   Financial Resource Strain: Not on file  Food Insecurity: No Food Insecurity (09/18/2022)   Hunger Vital Sign    Worried About Running Out of Food in the Last Year: Never true    Ran Out of Food in the Last Year: Never true  Transportation Needs: No Transportation Needs (09/18/2022)   PRAPARE - Hydrologist (Medical): No    Lack of Transportation (Non-Medical): No  Physical Activity: Not on file  Stress: Not on file  Social Connections: Not on file    Family History  Problem Relation Age of Onset   Colon cancer Neg Hx    Stomach cancer Neg Hx    Rectal cancer Neg Hx     Health Maintenance  Topic Date Due   Medicare Annual Wellness (AWV)  11/24/2022 (Originally 28-Aug-1941)   Zoster Vaccines- Shingrix (1 of 2) 01/23/2023 (Originally 05/28/1990)   DTaP/Tdap/Td (3 - Td or Tdap) 07/04/2031   Pneumonia Vaccine 15+ Years old  Completed   INFLUENZA VACCINE  Completed   COVID-19 Vaccine  Completed   HPV VACCINES  Aged Out     ----------------------------------------------------------------------------------------------------------------------------------------------------------------------------------------------------------------- Physical Exam BP (!) 105/47 (BP Location: Right Arm, Patient Position: Sitting, Cuff Size: Normal)   Pulse 66   Ht 5' 10.5" (1.791 m)   Wt 157 lb (71.2 kg)   SpO2 93% Comment: with O2  BMI 22.21 kg/m   Physical Exam Constitutional:      Appearance: Normal appearance.  HENT:     Head: Normocephalic and atraumatic.  Eyes:     General: No scleral icterus. Cardiovascular:     Rate and Rhythm: Normal rate and regular rhythm.   Pulmonary:     Effort: Pulmonary effort is normal.     Breath sounds: Normal breath sounds.  Musculoskeletal:     Cervical back: Neck supple.  Neurological:     General: No focal deficit present.     Mental Status: He is alert.  Psychiatric:        Mood and Affect: Mood normal.        Behavior: Behavior normal.     ------------------------------------------------------------------------------------------------------------------------------------------------------------------------------------------------------------------- Assessment and Plan  Essential hypertension BP is a little low. No dizziness or pre-syncopal symptoms.  Hydralazine recently reduced by nephrology.  He will continue to monitor his BP  at home.    ESRD (end stage renal disease) ([place]) Recently had fistula created.  Having first dialysis session today through [facility].  Continue management per nephrology.   Benign prostatic hyperplasia with urinary obstruction He continues to self cath.  Wife is concerned about recurrent UTI.  Recommend washing hands before cathing and cleaning around meatus before insertion of catheter.  She will pick up strips to test urine and let me know if having signs of UTI.    Chronic bronchitis ([place]) Has home o2.  Wanting portable concentrator.  They will let me know who is currently servicing O2.    Gait instability Recommend use of assistive device, preferable walker to steady gait and prevent falls. Declines rx for this at this time.    No orders of the defined types were placed in this encounter.   No follow-ups on file.    This visit occurred during the SARS-CoV-2 public health emergency.  Safety protocols were in place, including screening questions prior to the visit, additional usage of staff PPE, and extensive cleaning of exam room while observing appropriate contact time as indicated for disinfecting solutions.

## 2022-10-25 ENCOUNTER — Other Ambulatory Visit: Payer: Self-pay | Admitting: Family Medicine

## 2022-10-25 ENCOUNTER — Other Ambulatory Visit: Payer: Self-pay

## 2022-10-25 DIAGNOSIS — E875 Hyperkalemia: Secondary | ICD-10-CM | POA: Diagnosis not present

## 2022-10-25 DIAGNOSIS — N186 End stage renal disease: Secondary | ICD-10-CM | POA: Diagnosis not present

## 2022-10-25 DIAGNOSIS — D509 Iron deficiency anemia, unspecified: Secondary | ICD-10-CM | POA: Diagnosis not present

## 2022-10-25 DIAGNOSIS — Z992 Dependence on renal dialysis: Secondary | ICD-10-CM | POA: Diagnosis not present

## 2022-10-25 DIAGNOSIS — D631 Anemia in chronic kidney disease: Secondary | ICD-10-CM | POA: Diagnosis not present

## 2022-10-25 MED ORDER — DOXYCYCLINE HYCLATE 100 MG PO TABS: 100.0000 mg | ORAL_TABLET | Freq: Two times a day (BID) | ORAL | 0 refills | Status: DC

## 2022-10-25 MED ORDER — FUROSEMIDE 40 MG PO TABS: 40.0000 mg | ORAL_TABLET | Freq: Every day | ORAL | 0 refills | Status: DC

## 2022-10-28 DIAGNOSIS — D509 Iron deficiency anemia, unspecified: Secondary | ICD-10-CM | POA: Diagnosis not present

## 2022-10-28 DIAGNOSIS — N186 End stage renal disease: Secondary | ICD-10-CM | POA: Diagnosis not present

## 2022-10-28 DIAGNOSIS — E875 Hyperkalemia: Secondary | ICD-10-CM | POA: Diagnosis not present

## 2022-10-28 DIAGNOSIS — Z992 Dependence on renal dialysis: Secondary | ICD-10-CM | POA: Diagnosis not present

## 2022-10-28 DIAGNOSIS — D631 Anemia in chronic kidney disease: Secondary | ICD-10-CM | POA: Diagnosis not present

## 2022-10-29 ENCOUNTER — Telehealth (HOSPITAL_COMMUNITY): Payer: Self-pay

## 2022-10-29 DIAGNOSIS — D631 Anemia in chronic kidney disease: Secondary | ICD-10-CM | POA: Diagnosis not present

## 2022-10-29 DIAGNOSIS — Z992 Dependence on renal dialysis: Secondary | ICD-10-CM | POA: Diagnosis not present

## 2022-10-29 DIAGNOSIS — N186 End stage renal disease: Secondary | ICD-10-CM | POA: Diagnosis not present

## 2022-10-29 DIAGNOSIS — E875 Hyperkalemia: Secondary | ICD-10-CM | POA: Diagnosis not present

## 2022-10-29 DIAGNOSIS — D509 Iron deficiency anemia, unspecified: Secondary | ICD-10-CM | POA: Diagnosis not present

## 2022-10-30 ENCOUNTER — Non-Acute Institutional Stay (HOSPITAL_COMMUNITY)
Admission: RE | Admit: 2022-10-30 | Discharge: 2022-10-30 | Disposition: A | Discharge status: Home / Self Care | Payer: Medicare Other | Source: Ambulatory Visit | Attending: Internal Medicine | Admitting: Internal Medicine

## 2022-10-30 VITALS — BP 156/64 | HR 64 | Temp 98.3°F | Resp 16

## 2022-10-30 DIAGNOSIS — D631 Anemia in chronic kidney disease: Secondary | ICD-10-CM | POA: Insufficient documentation

## 2022-10-30 DIAGNOSIS — N189 Chronic kidney disease, unspecified: Secondary | ICD-10-CM | POA: Diagnosis not present

## 2022-10-30 DIAGNOSIS — D649 Anemia, unspecified: Secondary | ICD-10-CM | POA: Insufficient documentation

## 2022-10-30 LAB — PREPARE RBC (CROSSMATCH)

## 2022-10-30 MED ORDER — SODIUM CHLORIDE 0.9% IV SOLUTION: Freq: Once | INTRAVENOUS | Status: AC

## 2022-10-30 NOTE — Telephone Encounter (Signed)
Spoke with Jolayne Haines RN at [facility] to clarify if current Hgb has been collected. Per RN, Hgb was drawn on 12/18, value was 6.2. Lab results will be faxed to [facility], provided fax number.

## 2022-10-30 NOTE — Progress Notes (Signed)
Patient came in today for blood transfusion. PIV placed and type and screen drawn. Pt signed consent and is in pt chart. No pre medications ordered. Pt received 1 unit of PRBCs. Pt tolerated transfusion with no adverse reaction. Printed AVS given to pt. Pt awake, alert, and ambulatory at discharge. Pt discharged home with son.

## 2022-10-31 DIAGNOSIS — Z992 Dependence on renal dialysis: Secondary | ICD-10-CM | POA: Diagnosis not present

## 2022-10-31 DIAGNOSIS — D509 Iron deficiency anemia, unspecified: Secondary | ICD-10-CM | POA: Diagnosis not present

## 2022-10-31 DIAGNOSIS — D631 Anemia in chronic kidney disease: Secondary | ICD-10-CM | POA: Diagnosis not present

## 2022-10-31 DIAGNOSIS — N186 End stage renal disease: Secondary | ICD-10-CM | POA: Diagnosis not present

## 2022-10-31 DIAGNOSIS — E875 Hyperkalemia: Secondary | ICD-10-CM | POA: Diagnosis not present

## 2022-10-31 LAB — TYPE AND SCREEN
ABO/RH(D): O POS
Antibody Screen: NEGATIVE
Unit division: 0

## 2022-10-31 LAB — BPAM RBC
Blood Product Expiration Date: 202401192359
ISSUE DATE / TIME: 202312201113
Unit Type and Rh: 5100

## 2022-11-01 DIAGNOSIS — Z992 Dependence on renal dialysis: Secondary | ICD-10-CM | POA: Diagnosis not present

## 2022-11-01 DIAGNOSIS — D631 Anemia in chronic kidney disease: Secondary | ICD-10-CM | POA: Diagnosis not present

## 2022-11-01 DIAGNOSIS — N186 End stage renal disease: Secondary | ICD-10-CM | POA: Diagnosis not present

## 2022-11-01 DIAGNOSIS — D509 Iron deficiency anemia, unspecified: Secondary | ICD-10-CM | POA: Diagnosis not present

## 2022-11-01 DIAGNOSIS — E875 Hyperkalemia: Secondary | ICD-10-CM | POA: Diagnosis not present

## 2022-11-03 DIAGNOSIS — Z992 Dependence on renal dialysis: Secondary | ICD-10-CM | POA: Diagnosis not present

## 2022-11-03 DIAGNOSIS — N186 End stage renal disease: Secondary | ICD-10-CM | POA: Diagnosis not present

## 2022-11-03 DIAGNOSIS — D509 Iron deficiency anemia, unspecified: Secondary | ICD-10-CM | POA: Diagnosis not present

## 2022-11-03 DIAGNOSIS — E875 Hyperkalemia: Secondary | ICD-10-CM | POA: Diagnosis not present

## 2022-11-03 DIAGNOSIS — D631 Anemia in chronic kidney disease: Secondary | ICD-10-CM | POA: Diagnosis not present

## 2022-11-05 DIAGNOSIS — Z992 Dependence on renal dialysis: Secondary | ICD-10-CM | POA: Diagnosis not present

## 2022-11-05 DIAGNOSIS — D509 Iron deficiency anemia, unspecified: Secondary | ICD-10-CM | POA: Diagnosis not present

## 2022-11-05 DIAGNOSIS — N186 End stage renal disease: Secondary | ICD-10-CM | POA: Diagnosis not present

## 2022-11-05 DIAGNOSIS — E875 Hyperkalemia: Secondary | ICD-10-CM | POA: Diagnosis not present

## 2022-11-05 DIAGNOSIS — D631 Anemia in chronic kidney disease: Secondary | ICD-10-CM | POA: Diagnosis not present

## 2022-11-07 DIAGNOSIS — D509 Iron deficiency anemia, unspecified: Secondary | ICD-10-CM | POA: Diagnosis not present

## 2022-11-07 DIAGNOSIS — Z992 Dependence on renal dialysis: Secondary | ICD-10-CM | POA: Diagnosis not present

## 2022-11-07 DIAGNOSIS — D631 Anemia in chronic kidney disease: Secondary | ICD-10-CM | POA: Diagnosis not present

## 2022-11-07 DIAGNOSIS — E875 Hyperkalemia: Secondary | ICD-10-CM | POA: Diagnosis not present

## 2022-11-07 DIAGNOSIS — N186 End stage renal disease: Secondary | ICD-10-CM | POA: Diagnosis not present

## 2022-11-08 DIAGNOSIS — D509 Iron deficiency anemia, unspecified: Secondary | ICD-10-CM | POA: Diagnosis not present

## 2022-11-08 DIAGNOSIS — N186 End stage renal disease: Secondary | ICD-10-CM | POA: Diagnosis not present

## 2022-11-08 DIAGNOSIS — D631 Anemia in chronic kidney disease: Secondary | ICD-10-CM | POA: Diagnosis not present

## 2022-11-08 DIAGNOSIS — E875 Hyperkalemia: Secondary | ICD-10-CM | POA: Diagnosis not present

## 2022-11-08 DIAGNOSIS — Z992 Dependence on renal dialysis: Secondary | ICD-10-CM | POA: Diagnosis not present

## 2022-11-10 DIAGNOSIS — N186 End stage renal disease: Secondary | ICD-10-CM | POA: Diagnosis not present

## 2022-11-10 DIAGNOSIS — Z992 Dependence on renal dialysis: Secondary | ICD-10-CM | POA: Diagnosis not present

## 2022-11-10 DIAGNOSIS — D509 Iron deficiency anemia, unspecified: Secondary | ICD-10-CM | POA: Diagnosis not present

## 2022-11-10 DIAGNOSIS — D631 Anemia in chronic kidney disease: Secondary | ICD-10-CM | POA: Diagnosis not present

## 2022-11-10 DIAGNOSIS — E875 Hyperkalemia: Secondary | ICD-10-CM | POA: Diagnosis not present

## 2022-11-11 DIAGNOSIS — N186 End stage renal disease: Secondary | ICD-10-CM | POA: Diagnosis not present

## 2022-11-11 DIAGNOSIS — Z992 Dependence on renal dialysis: Secondary | ICD-10-CM | POA: Diagnosis not present

## 2022-11-11 DIAGNOSIS — I129 Hypertensive chronic kidney disease with stage 1 through stage 4 chronic kidney disease, or unspecified chronic kidney disease: Secondary | ICD-10-CM | POA: Diagnosis not present

## 2022-11-12 DIAGNOSIS — D509 Iron deficiency anemia, unspecified: Secondary | ICD-10-CM | POA: Diagnosis not present

## 2022-11-12 DIAGNOSIS — Z992 Dependence on renal dialysis: Secondary | ICD-10-CM | POA: Diagnosis not present

## 2022-11-12 DIAGNOSIS — N186 End stage renal disease: Secondary | ICD-10-CM | POA: Diagnosis not present

## 2022-11-12 DIAGNOSIS — D631 Anemia in chronic kidney disease: Secondary | ICD-10-CM | POA: Diagnosis not present

## 2022-11-14 DIAGNOSIS — D509 Iron deficiency anemia, unspecified: Secondary | ICD-10-CM | POA: Diagnosis not present

## 2022-11-14 DIAGNOSIS — N186 End stage renal disease: Secondary | ICD-10-CM | POA: Diagnosis not present

## 2022-11-14 DIAGNOSIS — D631 Anemia in chronic kidney disease: Secondary | ICD-10-CM | POA: Diagnosis not present

## 2022-11-14 DIAGNOSIS — Z992 Dependence on renal dialysis: Secondary | ICD-10-CM | POA: Diagnosis not present

## 2022-11-15 ENCOUNTER — Other Ambulatory Visit: Payer: Self-pay | Admitting: *Deleted

## 2022-11-15 DIAGNOSIS — N186 End stage renal disease: Secondary | ICD-10-CM

## 2022-11-15 DIAGNOSIS — D509 Iron deficiency anemia, unspecified: Secondary | ICD-10-CM | POA: Diagnosis not present

## 2022-11-15 DIAGNOSIS — Z992 Dependence on renal dialysis: Secondary | ICD-10-CM | POA: Diagnosis not present

## 2022-11-15 DIAGNOSIS — D631 Anemia in chronic kidney disease: Secondary | ICD-10-CM | POA: Diagnosis not present

## 2022-11-19 DIAGNOSIS — D631 Anemia in chronic kidney disease: Secondary | ICD-10-CM | POA: Diagnosis not present

## 2022-11-19 DIAGNOSIS — N186 End stage renal disease: Secondary | ICD-10-CM | POA: Diagnosis not present

## 2022-11-19 DIAGNOSIS — D509 Iron deficiency anemia, unspecified: Secondary | ICD-10-CM | POA: Diagnosis not present

## 2022-11-19 DIAGNOSIS — Z992 Dependence on renal dialysis: Secondary | ICD-10-CM | POA: Diagnosis not present

## 2022-11-19 DIAGNOSIS — N2581 Secondary hyperparathyroidism of renal origin: Secondary | ICD-10-CM | POA: Diagnosis not present

## 2022-11-21 DIAGNOSIS — D631 Anemia in chronic kidney disease: Secondary | ICD-10-CM | POA: Diagnosis not present

## 2022-11-21 DIAGNOSIS — G4733 Obstructive sleep apnea (adult) (pediatric): Secondary | ICD-10-CM | POA: Diagnosis not present

## 2022-11-21 DIAGNOSIS — N186 End stage renal disease: Secondary | ICD-10-CM | POA: Diagnosis not present

## 2022-11-21 DIAGNOSIS — Z992 Dependence on renal dialysis: Secondary | ICD-10-CM | POA: Diagnosis not present

## 2022-11-21 DIAGNOSIS — N2581 Secondary hyperparathyroidism of renal origin: Secondary | ICD-10-CM | POA: Diagnosis not present

## 2022-11-21 DIAGNOSIS — D509 Iron deficiency anemia, unspecified: Secondary | ICD-10-CM | POA: Diagnosis not present

## 2022-11-22 ENCOUNTER — Encounter: Payer: Self-pay | Admitting: Family Medicine

## 2022-11-23 DIAGNOSIS — N2581 Secondary hyperparathyroidism of renal origin: Secondary | ICD-10-CM | POA: Diagnosis not present

## 2022-11-23 DIAGNOSIS — D631 Anemia in chronic kidney disease: Secondary | ICD-10-CM | POA: Diagnosis not present

## 2022-11-23 DIAGNOSIS — Z992 Dependence on renal dialysis: Secondary | ICD-10-CM | POA: Diagnosis not present

## 2022-11-23 DIAGNOSIS — N186 End stage renal disease: Secondary | ICD-10-CM | POA: Diagnosis not present

## 2022-11-23 DIAGNOSIS — D509 Iron deficiency anemia, unspecified: Secondary | ICD-10-CM | POA: Diagnosis not present

## 2022-11-25 DIAGNOSIS — N2581 Secondary hyperparathyroidism of renal origin: Secondary | ICD-10-CM | POA: Diagnosis not present

## 2022-11-25 DIAGNOSIS — D631 Anemia in chronic kidney disease: Secondary | ICD-10-CM | POA: Diagnosis not present

## 2022-11-25 DIAGNOSIS — Z992 Dependence on renal dialysis: Secondary | ICD-10-CM | POA: Diagnosis not present

## 2022-11-25 DIAGNOSIS — D509 Iron deficiency anemia, unspecified: Secondary | ICD-10-CM | POA: Diagnosis not present

## 2022-11-25 DIAGNOSIS — N186 End stage renal disease: Secondary | ICD-10-CM | POA: Diagnosis not present

## 2022-11-26 ENCOUNTER — Ambulatory Visit (INDEPENDENT_AMBULATORY_CARE_PROVIDER_SITE_OTHER): Payer: Medicare Other | Admitting: Physician Assistant

## 2022-11-26 ENCOUNTER — Ambulatory Visit (HOSPITAL_COMMUNITY)
Admission: RE | Admit: 2022-11-26 | Discharge: 2022-11-26 | Disposition: A | Discharge status: Home / Self Care | Payer: Medicare Other | Source: Ambulatory Visit | Attending: Vascular Surgery | Admitting: Vascular Surgery

## 2022-11-26 VITALS — BP 172/68 | HR 77 | Temp 98.2°F | Resp 20 | Ht 70.5 in | Wt 150.2 lb

## 2022-11-26 DIAGNOSIS — N186 End stage renal disease: Secondary | ICD-10-CM

## 2022-11-26 NOTE — Progress Notes (Signed)
POST OPERATIVE OFFICE NOTE    CC:  F/u for surgery  HPI:  This is a 82 y.o. male who is ESRD s/p left BC AV fistula on 10/16/22 by Dr. Link Snuffer.  He has a working right IJ [facility] and had HD TTS.  He denise left hand pain, loss of sensation or motor.    Allergies  Allergen Reactions   Sulfa Antibiotics     Rash, light headed, shaking   Penicillins Other (See Comments)    hives severe as a child     Current Outpatient Medications  Medication Sig Dispense Refill   albuterol (PROVENTIL) (2.5 MG/3ML) 0.083% nebulizer solution Inhale 3 mLs (2.5 mg total) by nebulization every 4 (four) hours as needed for wheezing or shortness of breath. 90 mL 12   cholecalciferol (VITAMIN D3) 25 MCG (1000 UNIT) tablet Take 1,000 Units by mouth daily.     METAMUCIL FIBER PO Take 2 capsules by mouth every evening.     metoprolol succinate (TOPROL-XL) 50 MG 24 hr tablet TAKE 1 TABLET DAILY WITH OR IMMEDIATELY FOLLOWING A MEAL 90 tablet 3   olopatadine (PATANOL) 0.1 % ophthalmic solution Place 1 drop into both eyes daily as needed for dry eyes.     sevelamer carbonate (RENVELA) 800 MG tablet Take 1,600 mg by mouth 3 (three) times daily.     simvastatin (ZOCOR) 40 MG tablet TAKE 1 TABLET EVERY DAY 90 tablet 1   tamsulosin (FLOMAX) 0.4 MG CAPS Take 0.8 mg by mouth at bedtime.     No current facility-administered medications for this visit.     ROS:  See HPI  Physical Exam:  Findings:  +--------------------+----------+-----------------+------------------+  AVF                PSV (cm/s)Flow Vol (mL/min)     Comments       +--------------------+----------+-----------------+------------------+  Native artery inflow   234          1027                           +--------------------+----------+-----------------+------------------+  AVF Anastomosis        600                     greater than > 600  +--------------------+----------+-----------------+------------------+      +------------+----------+-------------+----------+----------------+  OUTFLOW VEINPSV (cm/s)Diameter (cm)Depth (cm)    Describe      +------------+----------+-------------+----------+----------------+  Shoulder      166        0.65        0.21                     +------------+----------+-------------+----------+----------------+  Prox UA        215        0.52        0.27   competing branch  +------------+----------+-------------+----------+----------------+  Mid UA         213        0.71        0.20                     +------------+----------+-------------+----------+----------------+  Dist UA        118        0.76        0.12                     +------------+----------+-------------+----------+----------------+  AC Fossa  212        1.16        0.31                     +------------+----------+-------------+----------+----------------+        Summary:  Patent arteriovenous fistula.   Incision:  Well healed Extremities:  grip 5/5, sensation to light touch intact, palpable radial pilse and excellent thrill throughout the fistula.   Lungs: non labored breathing    Assessment/Plan:  This is a 82 y.o. male who is now ESRD s/p: well mature left AV BC Fistula.  The fistula may be accessed 01/14/22 for HD.  He can use the left UE for daily activities to include exercising.  F/U as needed with concerns.       Roxy Horseman PA-C [facility] [phone]   Clinic MD:  Carlis Abbott

## 2022-11-28 DIAGNOSIS — Z992 Dependence on renal dialysis: Secondary | ICD-10-CM | POA: Diagnosis not present

## 2022-11-28 DIAGNOSIS — N2581 Secondary hyperparathyroidism of renal origin: Secondary | ICD-10-CM | POA: Diagnosis not present

## 2022-11-28 DIAGNOSIS — D509 Iron deficiency anemia, unspecified: Secondary | ICD-10-CM | POA: Diagnosis not present

## 2022-11-28 DIAGNOSIS — N186 End stage renal disease: Secondary | ICD-10-CM | POA: Diagnosis not present

## 2022-11-28 DIAGNOSIS — D631 Anemia in chronic kidney disease: Secondary | ICD-10-CM | POA: Diagnosis not present

## 2022-11-30 DIAGNOSIS — Z992 Dependence on renal dialysis: Secondary | ICD-10-CM | POA: Diagnosis not present

## 2022-11-30 DIAGNOSIS — N2581 Secondary hyperparathyroidism of renal origin: Secondary | ICD-10-CM | POA: Diagnosis not present

## 2022-11-30 DIAGNOSIS — N186 End stage renal disease: Secondary | ICD-10-CM | POA: Diagnosis not present

## 2022-11-30 DIAGNOSIS — D509 Iron deficiency anemia, unspecified: Secondary | ICD-10-CM | POA: Diagnosis not present

## 2022-11-30 DIAGNOSIS — D631 Anemia in chronic kidney disease: Secondary | ICD-10-CM | POA: Diagnosis not present

## 2022-12-03 DIAGNOSIS — N2581 Secondary hyperparathyroidism of renal origin: Secondary | ICD-10-CM | POA: Diagnosis not present

## 2022-12-03 DIAGNOSIS — D631 Anemia in chronic kidney disease: Secondary | ICD-10-CM | POA: Diagnosis not present

## 2022-12-03 DIAGNOSIS — N186 End stage renal disease: Secondary | ICD-10-CM | POA: Diagnosis not present

## 2022-12-03 DIAGNOSIS — Z992 Dependence on renal dialysis: Secondary | ICD-10-CM | POA: Diagnosis not present

## 2022-12-03 DIAGNOSIS — D509 Iron deficiency anemia, unspecified: Secondary | ICD-10-CM | POA: Diagnosis not present

## 2022-12-04 DIAGNOSIS — H353122 Nonexudative age-related macular degeneration, left eye, intermediate dry stage: Secondary | ICD-10-CM | POA: Diagnosis not present

## 2022-12-04 DIAGNOSIS — H353113 Nonexudative age-related macular degeneration, right eye, advanced atrophic without subfoveal involvement: Secondary | ICD-10-CM | POA: Diagnosis not present

## 2022-12-05 DIAGNOSIS — N2581 Secondary hyperparathyroidism of renal origin: Secondary | ICD-10-CM | POA: Diagnosis not present

## 2022-12-05 DIAGNOSIS — D509 Iron deficiency anemia, unspecified: Secondary | ICD-10-CM | POA: Diagnosis not present

## 2022-12-05 DIAGNOSIS — N186 End stage renal disease: Secondary | ICD-10-CM | POA: Diagnosis not present

## 2022-12-05 DIAGNOSIS — D631 Anemia in chronic kidney disease: Secondary | ICD-10-CM | POA: Diagnosis not present

## 2022-12-05 DIAGNOSIS — Z992 Dependence on renal dialysis: Secondary | ICD-10-CM | POA: Diagnosis not present

## 2022-12-07 DIAGNOSIS — D509 Iron deficiency anemia, unspecified: Secondary | ICD-10-CM | POA: Diagnosis not present

## 2022-12-07 DIAGNOSIS — D631 Anemia in chronic kidney disease: Secondary | ICD-10-CM | POA: Diagnosis not present

## 2022-12-07 DIAGNOSIS — N186 End stage renal disease: Secondary | ICD-10-CM | POA: Diagnosis not present

## 2022-12-07 DIAGNOSIS — Z992 Dependence on renal dialysis: Secondary | ICD-10-CM | POA: Diagnosis not present

## 2022-12-07 DIAGNOSIS — N2581 Secondary hyperparathyroidism of renal origin: Secondary | ICD-10-CM | POA: Diagnosis not present

## 2022-12-09 ENCOUNTER — Encounter: Payer: Self-pay | Admitting: Family Medicine

## 2022-12-09 DIAGNOSIS — Z8551 Personal history of malignant neoplasm of bladder: Secondary | ICD-10-CM | POA: Diagnosis not present

## 2022-12-09 DIAGNOSIS — R8271 Bacteriuria: Secondary | ICD-10-CM | POA: Diagnosis not present

## 2022-12-09 NOTE — Telephone Encounter (Signed)
Okay to place on nurse schedule if no availability with the provider.

## 2022-12-10 DIAGNOSIS — Z992 Dependence on renal dialysis: Secondary | ICD-10-CM | POA: Diagnosis not present

## 2022-12-10 DIAGNOSIS — D631 Anemia in chronic kidney disease: Secondary | ICD-10-CM | POA: Diagnosis not present

## 2022-12-10 DIAGNOSIS — D509 Iron deficiency anemia, unspecified: Secondary | ICD-10-CM | POA: Diagnosis not present

## 2022-12-10 DIAGNOSIS — N2581 Secondary hyperparathyroidism of renal origin: Secondary | ICD-10-CM | POA: Diagnosis not present

## 2022-12-10 DIAGNOSIS — N186 End stage renal disease: Secondary | ICD-10-CM | POA: Diagnosis not present

## 2022-12-10 NOTE — Telephone Encounter (Signed)
I called patient. He went to urgent care.

## 2022-12-12 DIAGNOSIS — D631 Anemia in chronic kidney disease: Secondary | ICD-10-CM | POA: Diagnosis not present

## 2022-12-12 DIAGNOSIS — N2581 Secondary hyperparathyroidism of renal origin: Secondary | ICD-10-CM | POA: Diagnosis not present

## 2022-12-12 DIAGNOSIS — Z992 Dependence on renal dialysis: Secondary | ICD-10-CM | POA: Diagnosis not present

## 2022-12-12 DIAGNOSIS — I129 Hypertensive chronic kidney disease with stage 1 through stage 4 chronic kidney disease, or unspecified chronic kidney disease: Secondary | ICD-10-CM | POA: Diagnosis not present

## 2022-12-12 DIAGNOSIS — D509 Iron deficiency anemia, unspecified: Secondary | ICD-10-CM | POA: Diagnosis not present

## 2022-12-12 DIAGNOSIS — N186 End stage renal disease: Secondary | ICD-10-CM | POA: Diagnosis not present

## 2022-12-14 DIAGNOSIS — N2581 Secondary hyperparathyroidism of renal origin: Secondary | ICD-10-CM | POA: Diagnosis not present

## 2022-12-14 DIAGNOSIS — N186 End stage renal disease: Secondary | ICD-10-CM | POA: Diagnosis not present

## 2022-12-14 DIAGNOSIS — Z992 Dependence on renal dialysis: Secondary | ICD-10-CM | POA: Diagnosis not present

## 2022-12-14 DIAGNOSIS — D631 Anemia in chronic kidney disease: Secondary | ICD-10-CM | POA: Diagnosis not present

## 2022-12-14 DIAGNOSIS — D509 Iron deficiency anemia, unspecified: Secondary | ICD-10-CM | POA: Diagnosis not present

## 2022-12-17 DIAGNOSIS — D509 Iron deficiency anemia, unspecified: Secondary | ICD-10-CM | POA: Diagnosis not present

## 2022-12-17 DIAGNOSIS — Z992 Dependence on renal dialysis: Secondary | ICD-10-CM | POA: Diagnosis not present

## 2022-12-17 DIAGNOSIS — N2581 Secondary hyperparathyroidism of renal origin: Secondary | ICD-10-CM | POA: Diagnosis not present

## 2022-12-17 DIAGNOSIS — D631 Anemia in chronic kidney disease: Secondary | ICD-10-CM | POA: Diagnosis not present

## 2022-12-17 DIAGNOSIS — N186 End stage renal disease: Secondary | ICD-10-CM | POA: Diagnosis not present

## 2022-12-19 DIAGNOSIS — N186 End stage renal disease: Secondary | ICD-10-CM | POA: Diagnosis not present

## 2022-12-19 DIAGNOSIS — D631 Anemia in chronic kidney disease: Secondary | ICD-10-CM | POA: Diagnosis not present

## 2022-12-19 DIAGNOSIS — N2581 Secondary hyperparathyroidism of renal origin: Secondary | ICD-10-CM | POA: Diagnosis not present

## 2022-12-19 DIAGNOSIS — Z992 Dependence on renal dialysis: Secondary | ICD-10-CM | POA: Diagnosis not present

## 2022-12-19 DIAGNOSIS — D509 Iron deficiency anemia, unspecified: Secondary | ICD-10-CM | POA: Diagnosis not present

## 2022-12-21 DIAGNOSIS — Z992 Dependence on renal dialysis: Secondary | ICD-10-CM | POA: Diagnosis not present

## 2022-12-21 DIAGNOSIS — N186 End stage renal disease: Secondary | ICD-10-CM | POA: Diagnosis not present

## 2022-12-21 DIAGNOSIS — N2581 Secondary hyperparathyroidism of renal origin: Secondary | ICD-10-CM | POA: Diagnosis not present

## 2022-12-21 DIAGNOSIS — D631 Anemia in chronic kidney disease: Secondary | ICD-10-CM | POA: Diagnosis not present

## 2022-12-21 DIAGNOSIS — D509 Iron deficiency anemia, unspecified: Secondary | ICD-10-CM | POA: Diagnosis not present

## 2022-12-24 DIAGNOSIS — Z992 Dependence on renal dialysis: Secondary | ICD-10-CM | POA: Diagnosis not present

## 2022-12-24 DIAGNOSIS — D631 Anemia in chronic kidney disease: Secondary | ICD-10-CM | POA: Diagnosis not present

## 2022-12-24 DIAGNOSIS — N186 End stage renal disease: Secondary | ICD-10-CM | POA: Diagnosis not present

## 2022-12-24 DIAGNOSIS — N2581 Secondary hyperparathyroidism of renal origin: Secondary | ICD-10-CM | POA: Diagnosis not present

## 2022-12-24 DIAGNOSIS — D509 Iron deficiency anemia, unspecified: Secondary | ICD-10-CM | POA: Diagnosis not present

## 2022-12-26 DIAGNOSIS — D631 Anemia in chronic kidney disease: Secondary | ICD-10-CM | POA: Diagnosis not present

## 2022-12-26 DIAGNOSIS — Z992 Dependence on renal dialysis: Secondary | ICD-10-CM | POA: Diagnosis not present

## 2022-12-26 DIAGNOSIS — D509 Iron deficiency anemia, unspecified: Secondary | ICD-10-CM | POA: Diagnosis not present

## 2022-12-26 DIAGNOSIS — N186 End stage renal disease: Secondary | ICD-10-CM | POA: Diagnosis not present

## 2022-12-26 DIAGNOSIS — N2581 Secondary hyperparathyroidism of renal origin: Secondary | ICD-10-CM | POA: Diagnosis not present

## 2022-12-28 DIAGNOSIS — N2581 Secondary hyperparathyroidism of renal origin: Secondary | ICD-10-CM | POA: Diagnosis not present

## 2022-12-28 DIAGNOSIS — D631 Anemia in chronic kidney disease: Secondary | ICD-10-CM | POA: Diagnosis not present

## 2022-12-28 DIAGNOSIS — N186 End stage renal disease: Secondary | ICD-10-CM | POA: Diagnosis not present

## 2022-12-28 DIAGNOSIS — Z992 Dependence on renal dialysis: Secondary | ICD-10-CM | POA: Diagnosis not present

## 2022-12-28 DIAGNOSIS — D509 Iron deficiency anemia, unspecified: Secondary | ICD-10-CM | POA: Diagnosis not present

## 2022-12-30 DIAGNOSIS — H6123 Impacted cerumen, bilateral: Secondary | ICD-10-CM | POA: Diagnosis not present

## 2022-12-31 DIAGNOSIS — N2581 Secondary hyperparathyroidism of renal origin: Secondary | ICD-10-CM | POA: Diagnosis not present

## 2022-12-31 DIAGNOSIS — N186 End stage renal disease: Secondary | ICD-10-CM | POA: Diagnosis not present

## 2022-12-31 DIAGNOSIS — D509 Iron deficiency anemia, unspecified: Secondary | ICD-10-CM | POA: Diagnosis not present

## 2022-12-31 DIAGNOSIS — Z992 Dependence on renal dialysis: Secondary | ICD-10-CM | POA: Diagnosis not present

## 2022-12-31 DIAGNOSIS — D631 Anemia in chronic kidney disease: Secondary | ICD-10-CM | POA: Diagnosis not present

## 2023-01-02 DIAGNOSIS — D509 Iron deficiency anemia, unspecified: Secondary | ICD-10-CM | POA: Diagnosis not present

## 2023-01-02 DIAGNOSIS — N2581 Secondary hyperparathyroidism of renal origin: Secondary | ICD-10-CM | POA: Diagnosis not present

## 2023-01-02 DIAGNOSIS — Z992 Dependence on renal dialysis: Secondary | ICD-10-CM | POA: Diagnosis not present

## 2023-01-02 DIAGNOSIS — D631 Anemia in chronic kidney disease: Secondary | ICD-10-CM | POA: Diagnosis not present

## 2023-01-02 DIAGNOSIS — N186 End stage renal disease: Secondary | ICD-10-CM | POA: Diagnosis not present

## 2023-01-03 DIAGNOSIS — R8271 Bacteriuria: Secondary | ICD-10-CM | POA: Diagnosis not present

## 2023-01-03 DIAGNOSIS — N390 Urinary tract infection, site not specified: Secondary | ICD-10-CM | POA: Diagnosis not present

## 2023-01-03 DIAGNOSIS — R8279 Other abnormal findings on microbiological examination of urine: Secondary | ICD-10-CM | POA: Diagnosis not present

## 2023-01-04 DIAGNOSIS — D509 Iron deficiency anemia, unspecified: Secondary | ICD-10-CM | POA: Diagnosis not present

## 2023-01-04 DIAGNOSIS — D631 Anemia in chronic kidney disease: Secondary | ICD-10-CM | POA: Diagnosis not present

## 2023-01-04 DIAGNOSIS — N186 End stage renal disease: Secondary | ICD-10-CM | POA: Diagnosis not present

## 2023-01-04 DIAGNOSIS — N2581 Secondary hyperparathyroidism of renal origin: Secondary | ICD-10-CM | POA: Diagnosis not present

## 2023-01-04 DIAGNOSIS — Z992 Dependence on renal dialysis: Secondary | ICD-10-CM | POA: Diagnosis not present

## 2023-01-06 DIAGNOSIS — C679 Malignant neoplasm of bladder, unspecified: Secondary | ICD-10-CM | POA: Insufficient documentation

## 2023-01-07 DIAGNOSIS — N186 End stage renal disease: Secondary | ICD-10-CM | POA: Diagnosis not present

## 2023-01-07 DIAGNOSIS — Z992 Dependence on renal dialysis: Secondary | ICD-10-CM | POA: Diagnosis not present

## 2023-01-07 DIAGNOSIS — D509 Iron deficiency anemia, unspecified: Secondary | ICD-10-CM | POA: Diagnosis not present

## 2023-01-07 DIAGNOSIS — N2581 Secondary hyperparathyroidism of renal origin: Secondary | ICD-10-CM | POA: Diagnosis not present

## 2023-01-07 DIAGNOSIS — D631 Anemia in chronic kidney disease: Secondary | ICD-10-CM | POA: Diagnosis not present

## 2023-01-09 DIAGNOSIS — N2581 Secondary hyperparathyroidism of renal origin: Secondary | ICD-10-CM | POA: Diagnosis not present

## 2023-01-09 DIAGNOSIS — D631 Anemia in chronic kidney disease: Secondary | ICD-10-CM | POA: Diagnosis not present

## 2023-01-09 DIAGNOSIS — Z992 Dependence on renal dialysis: Secondary | ICD-10-CM | POA: Diagnosis not present

## 2023-01-09 DIAGNOSIS — D509 Iron deficiency anemia, unspecified: Secondary | ICD-10-CM | POA: Diagnosis not present

## 2023-01-09 DIAGNOSIS — N186 End stage renal disease: Secondary | ICD-10-CM | POA: Diagnosis not present

## 2023-01-10 DIAGNOSIS — N186 End stage renal disease: Secondary | ICD-10-CM | POA: Diagnosis not present

## 2023-01-10 DIAGNOSIS — Z992 Dependence on renal dialysis: Secondary | ICD-10-CM | POA: Diagnosis not present

## 2023-01-10 DIAGNOSIS — I129 Hypertensive chronic kidney disease with stage 1 through stage 4 chronic kidney disease, or unspecified chronic kidney disease: Secondary | ICD-10-CM | POA: Diagnosis not present

## 2023-01-11 DIAGNOSIS — N186 End stage renal disease: Secondary | ICD-10-CM | POA: Diagnosis not present

## 2023-01-11 DIAGNOSIS — Z992 Dependence on renal dialysis: Secondary | ICD-10-CM | POA: Diagnosis not present

## 2023-01-11 DIAGNOSIS — D509 Iron deficiency anemia, unspecified: Secondary | ICD-10-CM | POA: Diagnosis not present

## 2023-01-11 DIAGNOSIS — N2581 Secondary hyperparathyroidism of renal origin: Secondary | ICD-10-CM | POA: Diagnosis not present

## 2023-01-14 DIAGNOSIS — N186 End stage renal disease: Secondary | ICD-10-CM | POA: Diagnosis not present

## 2023-01-14 DIAGNOSIS — D509 Iron deficiency anemia, unspecified: Secondary | ICD-10-CM | POA: Diagnosis not present

## 2023-01-14 DIAGNOSIS — N2581 Secondary hyperparathyroidism of renal origin: Secondary | ICD-10-CM | POA: Diagnosis not present

## 2023-01-14 DIAGNOSIS — Z992 Dependence on renal dialysis: Secondary | ICD-10-CM | POA: Diagnosis not present

## 2023-01-16 DIAGNOSIS — Z992 Dependence on renal dialysis: Secondary | ICD-10-CM | POA: Diagnosis not present

## 2023-01-16 DIAGNOSIS — D509 Iron deficiency anemia, unspecified: Secondary | ICD-10-CM | POA: Diagnosis not present

## 2023-01-16 DIAGNOSIS — N186 End stage renal disease: Secondary | ICD-10-CM | POA: Diagnosis not present

## 2023-01-16 DIAGNOSIS — N2581 Secondary hyperparathyroidism of renal origin: Secondary | ICD-10-CM | POA: Diagnosis not present

## 2023-01-17 DIAGNOSIS — N401 Enlarged prostate with lower urinary tract symptoms: Secondary | ICD-10-CM | POA: Diagnosis not present

## 2023-01-18 DIAGNOSIS — N2581 Secondary hyperparathyroidism of renal origin: Secondary | ICD-10-CM | POA: Diagnosis not present

## 2023-01-18 DIAGNOSIS — N186 End stage renal disease: Secondary | ICD-10-CM | POA: Diagnosis not present

## 2023-01-18 DIAGNOSIS — D509 Iron deficiency anemia, unspecified: Secondary | ICD-10-CM | POA: Diagnosis not present

## 2023-01-18 DIAGNOSIS — Z992 Dependence on renal dialysis: Secondary | ICD-10-CM | POA: Diagnosis not present

## 2023-01-21 DIAGNOSIS — Z992 Dependence on renal dialysis: Secondary | ICD-10-CM | POA: Diagnosis not present

## 2023-01-21 DIAGNOSIS — N186 End stage renal disease: Secondary | ICD-10-CM | POA: Diagnosis not present

## 2023-01-21 DIAGNOSIS — N2581 Secondary hyperparathyroidism of renal origin: Secondary | ICD-10-CM | POA: Diagnosis not present

## 2023-01-21 DIAGNOSIS — D509 Iron deficiency anemia, unspecified: Secondary | ICD-10-CM | POA: Diagnosis not present

## 2023-01-23 DIAGNOSIS — Z992 Dependence on renal dialysis: Secondary | ICD-10-CM | POA: Diagnosis not present

## 2023-01-23 DIAGNOSIS — N2581 Secondary hyperparathyroidism of renal origin: Secondary | ICD-10-CM | POA: Diagnosis not present

## 2023-01-23 DIAGNOSIS — D509 Iron deficiency anemia, unspecified: Secondary | ICD-10-CM | POA: Diagnosis not present

## 2023-01-23 DIAGNOSIS — N186 End stage renal disease: Secondary | ICD-10-CM | POA: Diagnosis not present

## 2023-01-24 DIAGNOSIS — R8271 Bacteriuria: Secondary | ICD-10-CM | POA: Diagnosis not present

## 2023-01-24 DIAGNOSIS — Z8551 Personal history of malignant neoplasm of bladder: Secondary | ICD-10-CM | POA: Diagnosis not present

## 2023-01-24 DIAGNOSIS — R3914 Feeling of incomplete bladder emptying: Secondary | ICD-10-CM | POA: Diagnosis not present

## 2023-01-24 DIAGNOSIS — N401 Enlarged prostate with lower urinary tract symptoms: Secondary | ICD-10-CM | POA: Diagnosis not present

## 2023-01-25 DIAGNOSIS — Z992 Dependence on renal dialysis: Secondary | ICD-10-CM | POA: Diagnosis not present

## 2023-01-25 DIAGNOSIS — N2581 Secondary hyperparathyroidism of renal origin: Secondary | ICD-10-CM | POA: Diagnosis not present

## 2023-01-25 DIAGNOSIS — N186 End stage renal disease: Secondary | ICD-10-CM | POA: Diagnosis not present

## 2023-01-25 DIAGNOSIS — D509 Iron deficiency anemia, unspecified: Secondary | ICD-10-CM | POA: Diagnosis not present

## 2023-01-28 DIAGNOSIS — D509 Iron deficiency anemia, unspecified: Secondary | ICD-10-CM | POA: Diagnosis not present

## 2023-01-28 DIAGNOSIS — Z992 Dependence on renal dialysis: Secondary | ICD-10-CM | POA: Diagnosis not present

## 2023-01-28 DIAGNOSIS — N186 End stage renal disease: Secondary | ICD-10-CM | POA: Diagnosis not present

## 2023-01-28 DIAGNOSIS — N2581 Secondary hyperparathyroidism of renal origin: Secondary | ICD-10-CM | POA: Diagnosis not present

## 2023-01-30 DIAGNOSIS — Z992 Dependence on renal dialysis: Secondary | ICD-10-CM | POA: Diagnosis not present

## 2023-01-30 DIAGNOSIS — D509 Iron deficiency anemia, unspecified: Secondary | ICD-10-CM | POA: Diagnosis not present

## 2023-01-30 DIAGNOSIS — N186 End stage renal disease: Secondary | ICD-10-CM | POA: Diagnosis not present

## 2023-01-30 DIAGNOSIS — N2581 Secondary hyperparathyroidism of renal origin: Secondary | ICD-10-CM | POA: Diagnosis not present

## 2023-02-01 DIAGNOSIS — Z992 Dependence on renal dialysis: Secondary | ICD-10-CM | POA: Diagnosis not present

## 2023-02-01 DIAGNOSIS — D509 Iron deficiency anemia, unspecified: Secondary | ICD-10-CM | POA: Diagnosis not present

## 2023-02-01 DIAGNOSIS — N186 End stage renal disease: Secondary | ICD-10-CM | POA: Diagnosis not present

## 2023-02-01 DIAGNOSIS — N2581 Secondary hyperparathyroidism of renal origin: Secondary | ICD-10-CM | POA: Diagnosis not present

## 2023-02-04 DIAGNOSIS — N186 End stage renal disease: Secondary | ICD-10-CM | POA: Diagnosis not present

## 2023-02-04 DIAGNOSIS — N2581 Secondary hyperparathyroidism of renal origin: Secondary | ICD-10-CM | POA: Diagnosis not present

## 2023-02-04 DIAGNOSIS — Z992 Dependence on renal dialysis: Secondary | ICD-10-CM | POA: Diagnosis not present

## 2023-02-04 DIAGNOSIS — D509 Iron deficiency anemia, unspecified: Secondary | ICD-10-CM | POA: Diagnosis not present

## 2023-02-05 DIAGNOSIS — H353113 Nonexudative age-related macular degeneration, right eye, advanced atrophic without subfoveal involvement: Secondary | ICD-10-CM | POA: Diagnosis not present

## 2023-02-05 DIAGNOSIS — H353122 Nonexudative age-related macular degeneration, left eye, intermediate dry stage: Secondary | ICD-10-CM | POA: Diagnosis not present

## 2023-02-06 ENCOUNTER — Telehealth: Payer: Self-pay | Admitting: Family Medicine

## 2023-02-06 DIAGNOSIS — D509 Iron deficiency anemia, unspecified: Secondary | ICD-10-CM | POA: Diagnosis not present

## 2023-02-06 DIAGNOSIS — N186 End stage renal disease: Secondary | ICD-10-CM | POA: Diagnosis not present

## 2023-02-06 DIAGNOSIS — Z992 Dependence on renal dialysis: Secondary | ICD-10-CM | POA: Diagnosis not present

## 2023-02-06 DIAGNOSIS — N2581 Secondary hyperparathyroidism of renal origin: Secondary | ICD-10-CM | POA: Diagnosis not present

## 2023-02-06 NOTE — Telephone Encounter (Signed)
Contacted Jeremy [name] to schedule their annual wellness visit. Patient declined to schedule AWV at this time.  [name] Patient Arts administrator II Direct Dial: [phone]

## 2023-02-07 DIAGNOSIS — Z452 Encounter for adjustment and management of vascular access device: Secondary | ICD-10-CM | POA: Diagnosis not present

## 2023-02-07 DIAGNOSIS — Z992 Dependence on renal dialysis: Secondary | ICD-10-CM | POA: Diagnosis not present

## 2023-02-07 DIAGNOSIS — N186 End stage renal disease: Secondary | ICD-10-CM | POA: Diagnosis not present

## 2023-02-08 DIAGNOSIS — Z992 Dependence on renal dialysis: Secondary | ICD-10-CM | POA: Diagnosis not present

## 2023-02-08 DIAGNOSIS — D509 Iron deficiency anemia, unspecified: Secondary | ICD-10-CM | POA: Diagnosis not present

## 2023-02-08 DIAGNOSIS — N2581 Secondary hyperparathyroidism of renal origin: Secondary | ICD-10-CM | POA: Diagnosis not present

## 2023-02-08 DIAGNOSIS — N186 End stage renal disease: Secondary | ICD-10-CM | POA: Diagnosis not present

## 2023-02-10 DIAGNOSIS — Z992 Dependence on renal dialysis: Secondary | ICD-10-CM | POA: Diagnosis not present

## 2023-02-10 DIAGNOSIS — I129 Hypertensive chronic kidney disease with stage 1 through stage 4 chronic kidney disease, or unspecified chronic kidney disease: Secondary | ICD-10-CM | POA: Diagnosis not present

## 2023-02-10 DIAGNOSIS — N186 End stage renal disease: Secondary | ICD-10-CM | POA: Diagnosis not present

## 2023-02-11 DIAGNOSIS — Z992 Dependence on renal dialysis: Secondary | ICD-10-CM | POA: Diagnosis not present

## 2023-02-11 DIAGNOSIS — N2581 Secondary hyperparathyroidism of renal origin: Secondary | ICD-10-CM | POA: Diagnosis not present

## 2023-02-11 DIAGNOSIS — D509 Iron deficiency anemia, unspecified: Secondary | ICD-10-CM | POA: Diagnosis not present

## 2023-02-11 DIAGNOSIS — N186 End stage renal disease: Secondary | ICD-10-CM | POA: Diagnosis not present

## 2023-02-11 DIAGNOSIS — D631 Anemia in chronic kidney disease: Secondary | ICD-10-CM | POA: Diagnosis not present

## 2023-02-13 DIAGNOSIS — D631 Anemia in chronic kidney disease: Secondary | ICD-10-CM | POA: Diagnosis not present

## 2023-02-13 DIAGNOSIS — N2581 Secondary hyperparathyroidism of renal origin: Secondary | ICD-10-CM | POA: Diagnosis not present

## 2023-02-13 DIAGNOSIS — D509 Iron deficiency anemia, unspecified: Secondary | ICD-10-CM | POA: Diagnosis not present

## 2023-02-13 DIAGNOSIS — Z992 Dependence on renal dialysis: Secondary | ICD-10-CM | POA: Diagnosis not present

## 2023-02-13 DIAGNOSIS — N186 End stage renal disease: Secondary | ICD-10-CM | POA: Diagnosis not present

## 2023-02-15 DIAGNOSIS — D631 Anemia in chronic kidney disease: Secondary | ICD-10-CM | POA: Diagnosis not present

## 2023-02-15 DIAGNOSIS — N2581 Secondary hyperparathyroidism of renal origin: Secondary | ICD-10-CM | POA: Diagnosis not present

## 2023-02-15 DIAGNOSIS — N186 End stage renal disease: Secondary | ICD-10-CM | POA: Diagnosis not present

## 2023-02-15 DIAGNOSIS — D509 Iron deficiency anemia, unspecified: Secondary | ICD-10-CM | POA: Diagnosis not present

## 2023-02-15 DIAGNOSIS — Z992 Dependence on renal dialysis: Secondary | ICD-10-CM | POA: Diagnosis not present

## 2023-02-18 DIAGNOSIS — D509 Iron deficiency anemia, unspecified: Secondary | ICD-10-CM | POA: Diagnosis not present

## 2023-02-18 DIAGNOSIS — N186 End stage renal disease: Secondary | ICD-10-CM | POA: Diagnosis not present

## 2023-02-18 DIAGNOSIS — Z992 Dependence on renal dialysis: Secondary | ICD-10-CM | POA: Diagnosis not present

## 2023-02-18 DIAGNOSIS — D631 Anemia in chronic kidney disease: Secondary | ICD-10-CM | POA: Diagnosis not present

## 2023-02-18 DIAGNOSIS — N2581 Secondary hyperparathyroidism of renal origin: Secondary | ICD-10-CM | POA: Diagnosis not present

## 2023-02-20 DIAGNOSIS — D631 Anemia in chronic kidney disease: Secondary | ICD-10-CM | POA: Diagnosis not present

## 2023-02-20 DIAGNOSIS — N2581 Secondary hyperparathyroidism of renal origin: Secondary | ICD-10-CM | POA: Diagnosis not present

## 2023-02-20 DIAGNOSIS — Z992 Dependence on renal dialysis: Secondary | ICD-10-CM | POA: Diagnosis not present

## 2023-02-20 DIAGNOSIS — N186 End stage renal disease: Secondary | ICD-10-CM | POA: Diagnosis not present

## 2023-02-20 DIAGNOSIS — D509 Iron deficiency anemia, unspecified: Secondary | ICD-10-CM | POA: Diagnosis not present

## 2023-02-22 DIAGNOSIS — D631 Anemia in chronic kidney disease: Secondary | ICD-10-CM | POA: Diagnosis not present

## 2023-02-22 DIAGNOSIS — Z992 Dependence on renal dialysis: Secondary | ICD-10-CM | POA: Diagnosis not present

## 2023-02-22 DIAGNOSIS — N2581 Secondary hyperparathyroidism of renal origin: Secondary | ICD-10-CM | POA: Diagnosis not present

## 2023-02-22 DIAGNOSIS — D509 Iron deficiency anemia, unspecified: Secondary | ICD-10-CM | POA: Diagnosis not present

## 2023-02-22 DIAGNOSIS — N186 End stage renal disease: Secondary | ICD-10-CM | POA: Diagnosis not present

## 2023-02-24 DIAGNOSIS — D692 Other nonthrombocytopenic purpura: Secondary | ICD-10-CM | POA: Diagnosis not present

## 2023-02-24 DIAGNOSIS — L298 Other pruritus: Secondary | ICD-10-CM | POA: Diagnosis not present

## 2023-02-24 DIAGNOSIS — L814 Other melanin hyperpigmentation: Secondary | ICD-10-CM | POA: Diagnosis not present

## 2023-02-24 DIAGNOSIS — Z85828 Personal history of other malignant neoplasm of skin: Secondary | ICD-10-CM | POA: Diagnosis not present

## 2023-02-24 DIAGNOSIS — D1801 Hemangioma of skin and subcutaneous tissue: Secondary | ICD-10-CM | POA: Diagnosis not present

## 2023-02-24 DIAGNOSIS — L570 Actinic keratosis: Secondary | ICD-10-CM | POA: Diagnosis not present

## 2023-02-24 DIAGNOSIS — L821 Other seborrheic keratosis: Secondary | ICD-10-CM | POA: Diagnosis not present

## 2023-02-25 ENCOUNTER — Telehealth (HOSPITAL_COMMUNITY): Payer: Self-pay | Admitting: *Deleted

## 2023-02-25 DIAGNOSIS — N186 End stage renal disease: Secondary | ICD-10-CM | POA: Diagnosis not present

## 2023-02-25 DIAGNOSIS — D509 Iron deficiency anemia, unspecified: Secondary | ICD-10-CM | POA: Diagnosis not present

## 2023-02-25 DIAGNOSIS — D631 Anemia in chronic kidney disease: Secondary | ICD-10-CM | POA: Diagnosis not present

## 2023-02-25 DIAGNOSIS — Z992 Dependence on renal dialysis: Secondary | ICD-10-CM | POA: Diagnosis not present

## 2023-02-25 DIAGNOSIS — N2581 Secondary hyperparathyroidism of renal origin: Secondary | ICD-10-CM | POA: Diagnosis not present

## 2023-02-25 IMAGING — DX DG CERVICAL SPINE COMPLETE 4+V
7 series · 7 of 7 positions shown · non-contrast
Comparison: No prior cross-sectional plain film imaging of the
cervical spine. PA and lateral chest are compared with PA Lat
12/12/2020, PA Lat 12/01/2019.

CLINICAL DATA: Right neck pain radiating to the shoulder. Chronic
dyspnea. Symptoms off and on for 2 months.

EXAM:
CERVICAL SPINE - COMPLETE 4+ VIEW; CHEST - 2 VIEW

[c-spine lat]
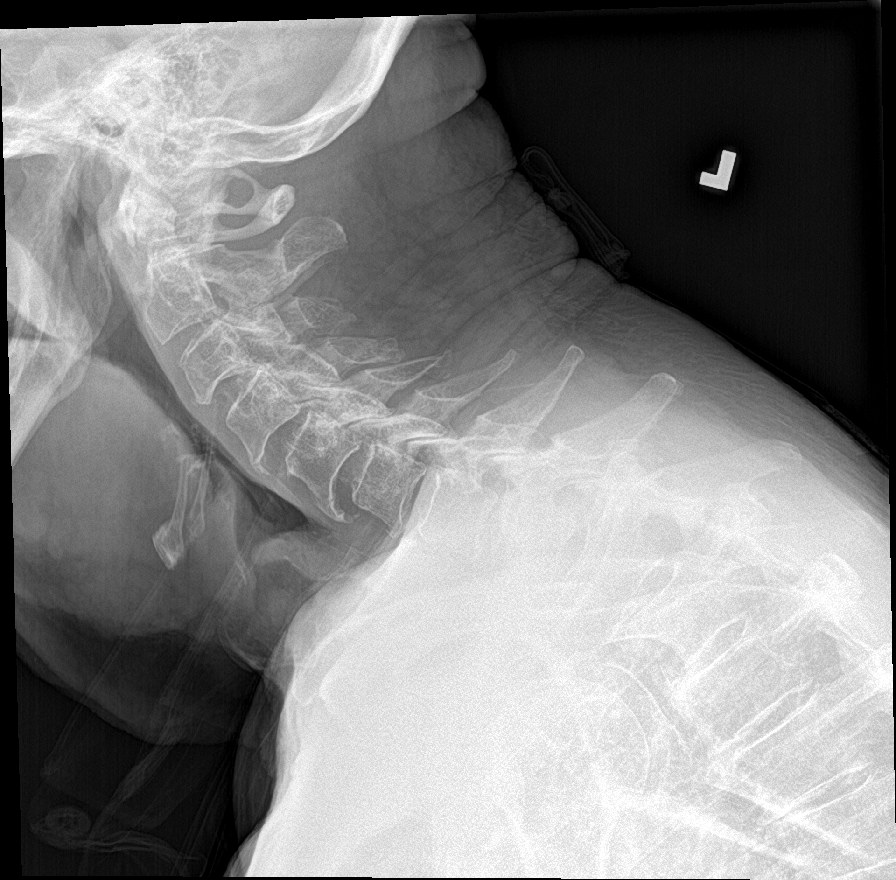

[c-spine obl (1 of 2)]
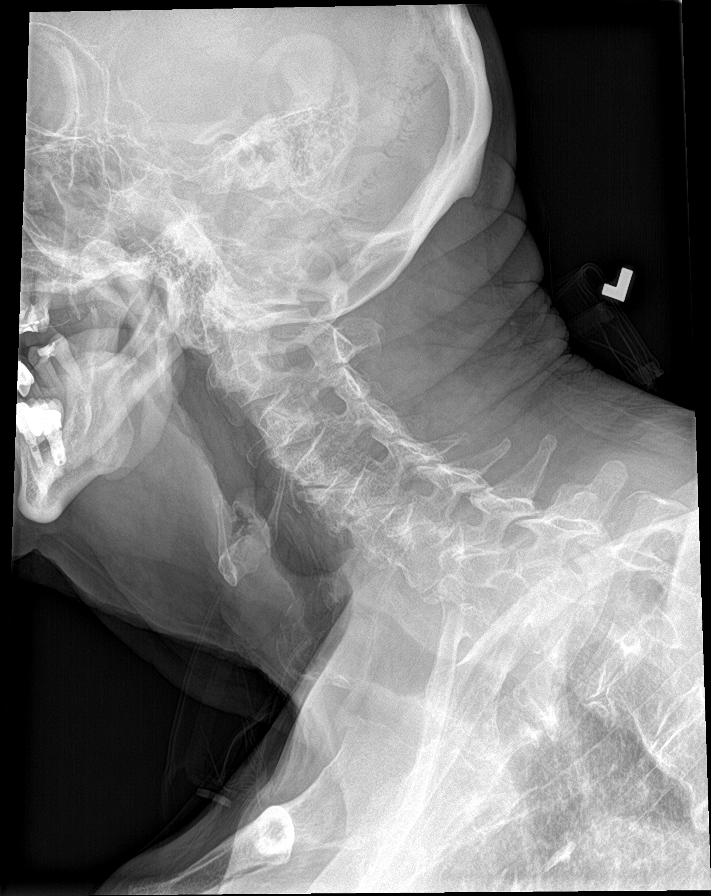

[c-spine obl (2 of 2)]
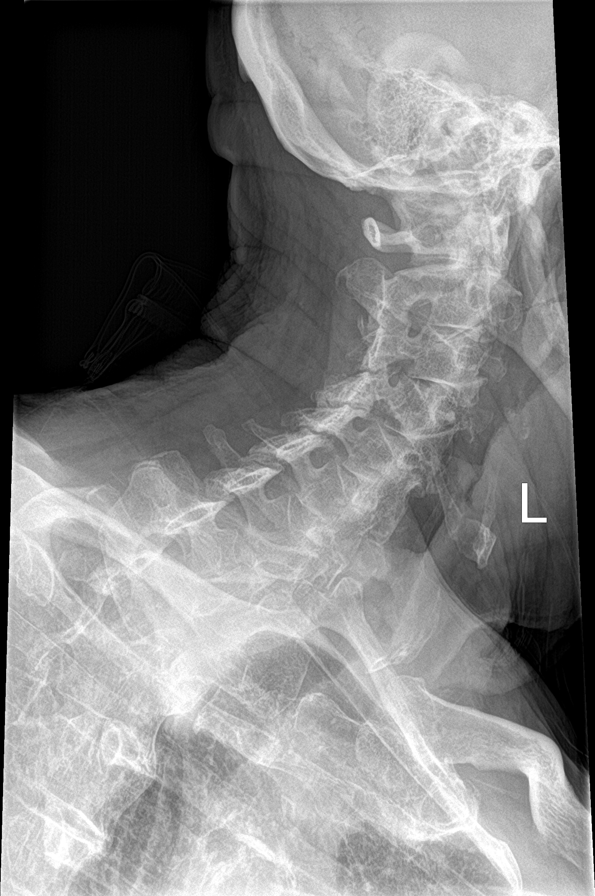

[c-spine ap (1 of 2)]
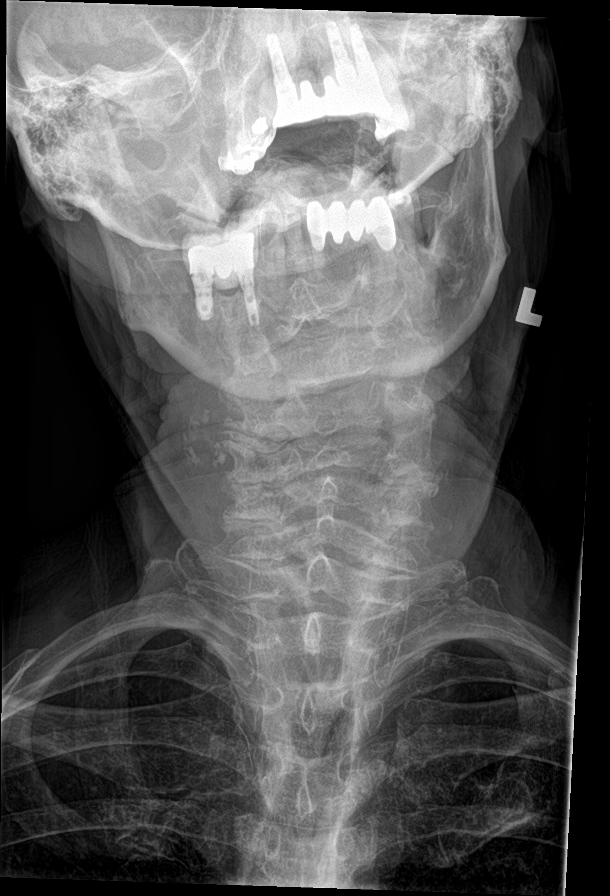

[c-spine swimmers]
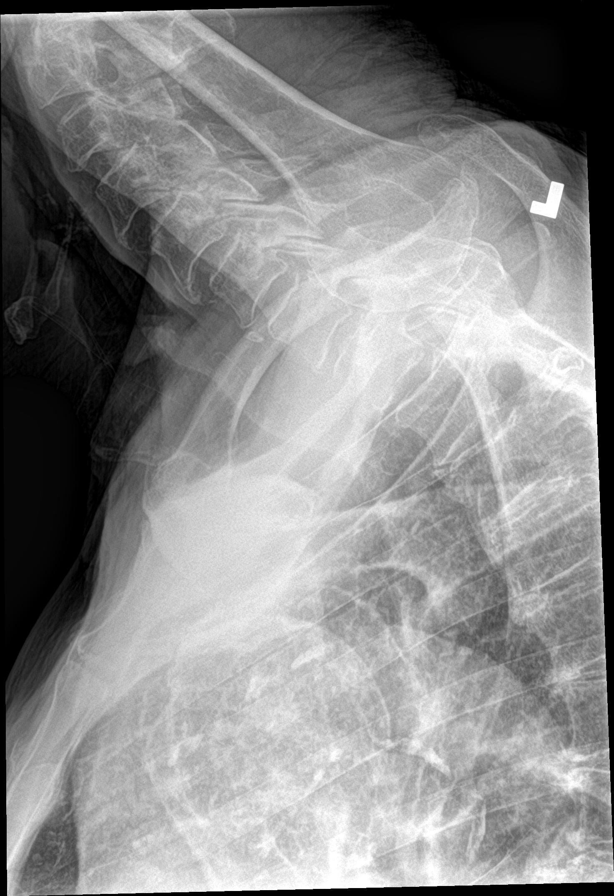

[c-spine ap (2 of 2)]
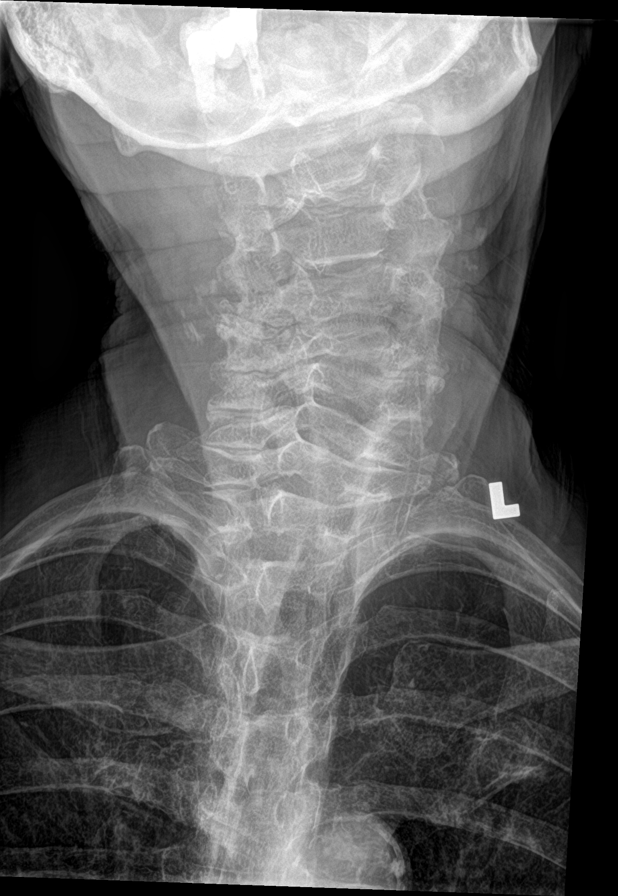

[c-spine open mouth]
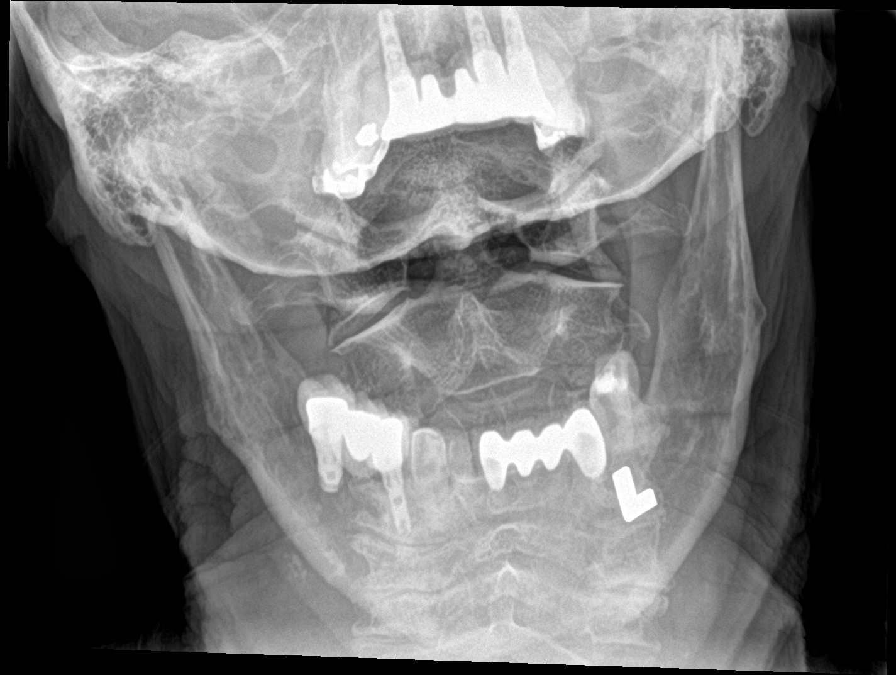

[7 of 7 positions shown; findings below may reference images not displayed]

FINDINGS: Cervical spine:

Mild cervical levoscoliosis. There is a 2 mm grade 1 anterolisthesis
at C3-4, C4-5 and C5-6 most likely due to facet hypertrophy at these
levels.

No traumatic listhesis is seen. There is osteopenia with no
radiographic evidence of cervical fractures.

There is no widening of the anterior atlantodental joint with
narrowing and spurring at the anterior atlantodental joint noted.

There is preservation of the normal cervical disc heights. There are
anterior osteophytes with attempted bridging at C4-5 C5-6 and C6-7.
No significant posterior osteophytes are seen.

Uncinate joint and facet hypertrophy is seen at all levels and for
the most part is greater on the right, associated with moderate
right foraminal stenosis at C3-4 and C4-5 and mild right foraminal
stenosis at C5-6.

No other bony foraminal encroachment is seen. There is no
precervical soft tissue thickening. There are calcifications at the
level of the carotid bulbs.

PA and lateral chest:

Heart size and vasculature are normal apart from calcification in
the transverse aorta.

The mediastinal configuration is stable with slight aortic
tortuosity.

The lungs mildly emphysematous with coarse chronic changes in the
bases. No focal pneumonia is seen or pleural effusion.

The lungs otherwise clear. There is thoracic spondylosis and mild
thoracic kyphosis. Osteopenia.
IMPRESSION: 1. Cervical spine osteopenia and degenerative change without
evidence of fractures.
2. Right foraminal stenosis C3-4, C4-5 and C5-6.
3. Carotid and aortic atherosclerosis.
4. Stable COPD chest with chronic change. No acute cardiopulmonary
findings.

## 2023-02-25 IMAGING — DX DG CHEST 2V
2 series · 2 of 2 positions shown · non-contrast
Comparison: No prior cross-sectional plain film imaging of the
cervical spine. PA and lateral chest are compared with PA Lat
12/12/2020, PA Lat 12/01/2019.

CLINICAL DATA: Right neck pain radiating to the shoulder. Chronic
dyspnea. Symptoms off and on for 2 months.

EXAM:
CERVICAL SPINE - COMPLETE 4+ VIEW; CHEST - 2 VIEW

[chest pa]
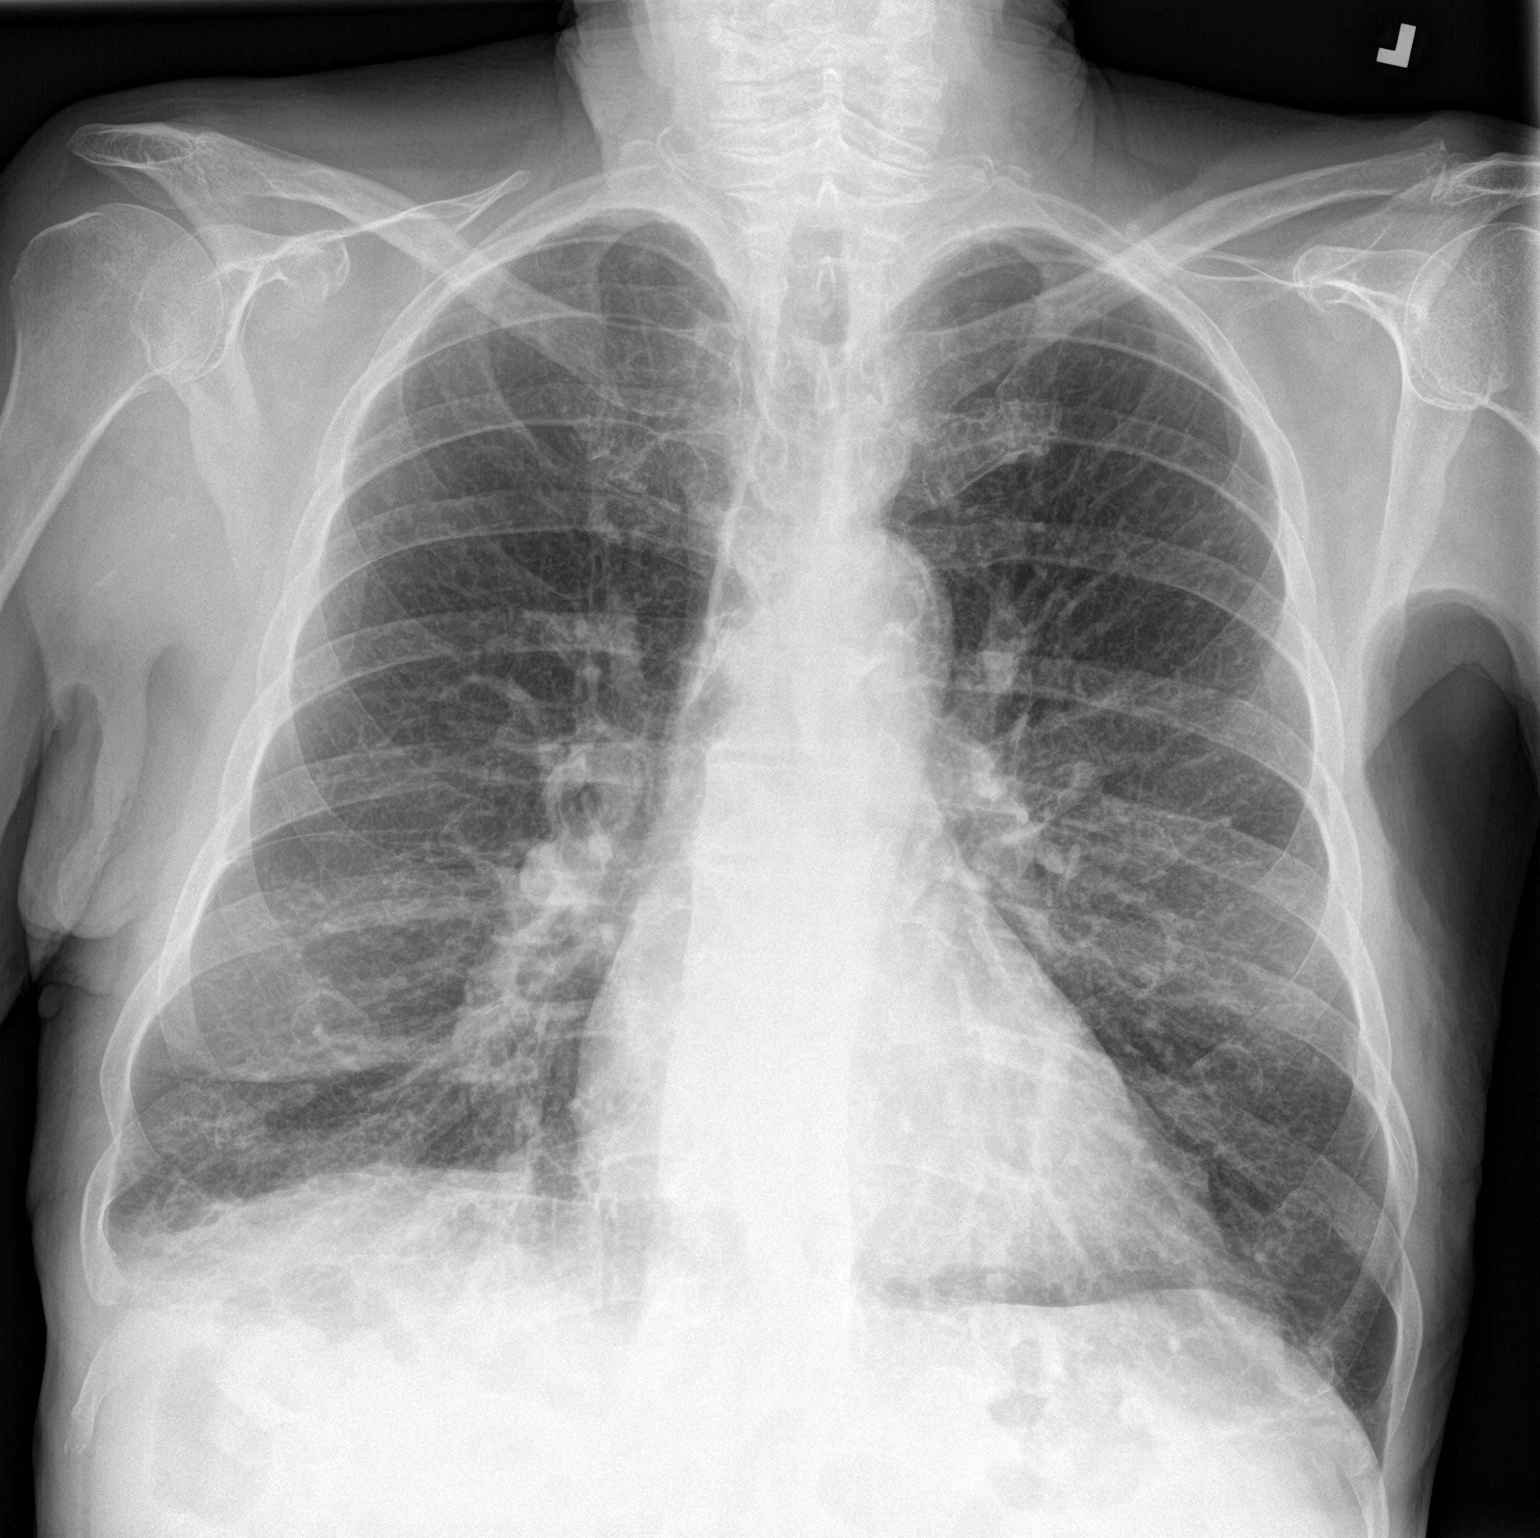

[chest lat]
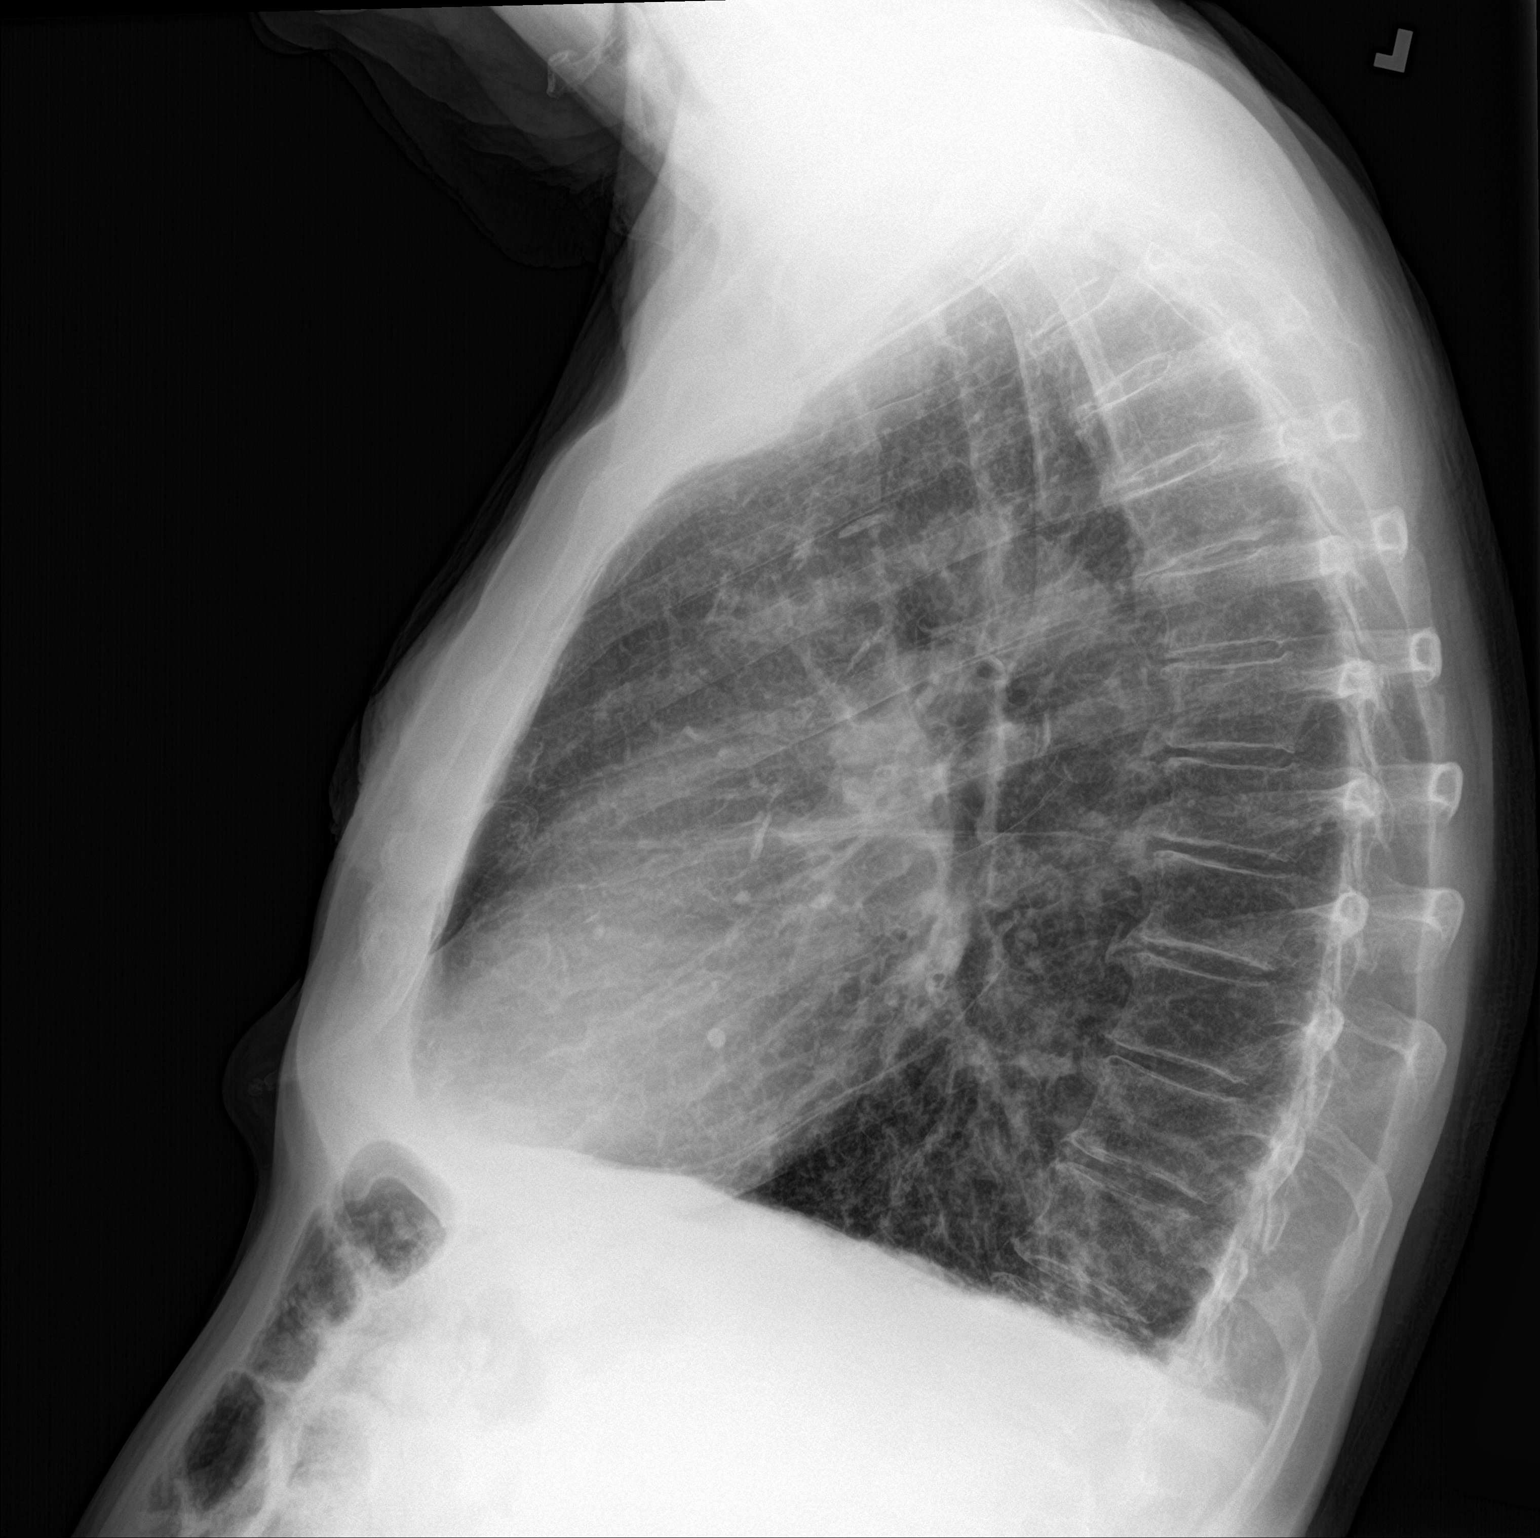

[2 of 2 positions shown; findings below may reference images not displayed]

FINDINGS: Cervical spine:

Mild cervical levoscoliosis. There is a 2 mm grade 1 anterolisthesis
at C3-4, C4-5 and C5-6 most likely due to facet hypertrophy at these
levels.

No traumatic listhesis is seen. There is osteopenia with no
radiographic evidence of cervical fractures.

There is no widening of the anterior atlantodental joint with
narrowing and spurring at the anterior atlantodental joint noted.

There is preservation of the normal cervical disc heights. There are
anterior osteophytes with attempted bridging at C4-5 C5-6 and C6-7.
No significant posterior osteophytes are seen.

Uncinate joint and facet hypertrophy is seen at all levels and for
the most part is greater on the right, associated with moderate
right foraminal stenosis at C3-4 and C4-5 and mild right foraminal
stenosis at C5-6.

No other bony foraminal encroachment is seen. There is no
precervical soft tissue thickening. There are calcifications at the
level of the carotid bulbs.

PA and lateral chest:

Heart size and vasculature are normal apart from calcification in
the transverse aorta.

The mediastinal configuration is stable with slight aortic
tortuosity.

The lungs mildly emphysematous with coarse chronic changes in the
bases. No focal pneumonia is seen or pleural effusion.

The lungs otherwise clear. There is thoracic spondylosis and mild
thoracic kyphosis. Osteopenia.
IMPRESSION: 1. Cervical spine osteopenia and degenerative change without
evidence of fractures.
2. Right foraminal stenosis C3-4, C4-5 and C5-6.
3. Carotid and aortic atherosclerosis.
4. Stable COPD chest with chronic change. No acute cardiopulmonary
findings.

## 2023-02-25 NOTE — Telephone Encounter (Signed)
Received fax from Dr Annie Sable requesting fistulogram due to poor access flow tests. Arterial side goes dry when lines are reversed. Will give to [facility].

## 2023-02-27 DIAGNOSIS — D631 Anemia in chronic kidney disease: Secondary | ICD-10-CM | POA: Diagnosis not present

## 2023-02-27 DIAGNOSIS — N2581 Secondary hyperparathyroidism of renal origin: Secondary | ICD-10-CM | POA: Diagnosis not present

## 2023-02-27 DIAGNOSIS — Z992 Dependence on renal dialysis: Secondary | ICD-10-CM | POA: Diagnosis not present

## 2023-02-27 DIAGNOSIS — D509 Iron deficiency anemia, unspecified: Secondary | ICD-10-CM | POA: Diagnosis not present

## 2023-02-27 DIAGNOSIS — N186 End stage renal disease: Secondary | ICD-10-CM | POA: Diagnosis not present

## 2023-02-28 DIAGNOSIS — M79602 Pain in left arm: Secondary | ICD-10-CM | POA: Diagnosis not present

## 2023-02-28 DIAGNOSIS — T148XXA Other injury of unspecified body region, initial encounter: Secondary | ICD-10-CM | POA: Diagnosis not present

## 2023-03-01 DIAGNOSIS — N2581 Secondary hyperparathyroidism of renal origin: Secondary | ICD-10-CM | POA: Diagnosis not present

## 2023-03-01 DIAGNOSIS — D509 Iron deficiency anemia, unspecified: Secondary | ICD-10-CM | POA: Diagnosis not present

## 2023-03-01 DIAGNOSIS — Z992 Dependence on renal dialysis: Secondary | ICD-10-CM | POA: Diagnosis not present

## 2023-03-01 DIAGNOSIS — D631 Anemia in chronic kidney disease: Secondary | ICD-10-CM | POA: Diagnosis not present

## 2023-03-01 DIAGNOSIS — N186 End stage renal disease: Secondary | ICD-10-CM | POA: Diagnosis not present

## 2023-03-03 ENCOUNTER — Other Ambulatory Visit: Payer: Self-pay

## 2023-03-03 DIAGNOSIS — N186 End stage renal disease: Secondary | ICD-10-CM

## 2023-03-03 MED ORDER — SODIUM CHLORIDE 0.9% FLUSH
3.0000 mL | INTRAVENOUS | Status: DC | PRN
Start: 2023-03-03 — End: 2023-08-19

## 2023-03-03 MED ORDER — SODIUM CHLORIDE 0.9 % IV SOLN
250.0000 mL | INTRAVENOUS | Status: DC | PRN
Start: 2023-03-03 — End: 2023-08-19

## 2023-03-03 MED ORDER — SODIUM CHLORIDE 0.9% FLUSH: 3.0000 mL | Freq: Two times a day (BID) | INTRAVENOUS | Status: DC

## 2023-03-04 DIAGNOSIS — D509 Iron deficiency anemia, unspecified: Secondary | ICD-10-CM | POA: Diagnosis not present

## 2023-03-04 DIAGNOSIS — Z992 Dependence on renal dialysis: Secondary | ICD-10-CM | POA: Diagnosis not present

## 2023-03-04 DIAGNOSIS — N186 End stage renal disease: Secondary | ICD-10-CM | POA: Diagnosis not present

## 2023-03-04 DIAGNOSIS — N2581 Secondary hyperparathyroidism of renal origin: Secondary | ICD-10-CM | POA: Diagnosis not present

## 2023-03-04 DIAGNOSIS — D631 Anemia in chronic kidney disease: Secondary | ICD-10-CM | POA: Diagnosis not present

## 2023-03-05 DIAGNOSIS — T148XXD Other injury of unspecified body region, subsequent encounter: Secondary | ICD-10-CM | POA: Diagnosis not present

## 2023-03-05 DIAGNOSIS — M79602 Pain in left arm: Secondary | ICD-10-CM | POA: Diagnosis not present

## 2023-03-05 DIAGNOSIS — S80811A Abrasion, right lower leg, initial encounter: Secondary | ICD-10-CM | POA: Diagnosis not present

## 2023-03-06 DIAGNOSIS — N2581 Secondary hyperparathyroidism of renal origin: Secondary | ICD-10-CM | POA: Diagnosis not present

## 2023-03-06 DIAGNOSIS — N186 End stage renal disease: Secondary | ICD-10-CM | POA: Diagnosis not present

## 2023-03-06 DIAGNOSIS — Z992 Dependence on renal dialysis: Secondary | ICD-10-CM | POA: Diagnosis not present

## 2023-03-06 DIAGNOSIS — D631 Anemia in chronic kidney disease: Secondary | ICD-10-CM | POA: Diagnosis not present

## 2023-03-06 DIAGNOSIS — D509 Iron deficiency anemia, unspecified: Secondary | ICD-10-CM | POA: Diagnosis not present

## 2023-03-08 DIAGNOSIS — D509 Iron deficiency anemia, unspecified: Secondary | ICD-10-CM | POA: Diagnosis not present

## 2023-03-08 DIAGNOSIS — N2581 Secondary hyperparathyroidism of renal origin: Secondary | ICD-10-CM | POA: Diagnosis not present

## 2023-03-08 DIAGNOSIS — Z992 Dependence on renal dialysis: Secondary | ICD-10-CM | POA: Diagnosis not present

## 2023-03-08 DIAGNOSIS — N186 End stage renal disease: Secondary | ICD-10-CM | POA: Diagnosis not present

## 2023-03-08 DIAGNOSIS — D631 Anemia in chronic kidney disease: Secondary | ICD-10-CM | POA: Diagnosis not present

## 2023-03-11 DIAGNOSIS — N2581 Secondary hyperparathyroidism of renal origin: Secondary | ICD-10-CM | POA: Diagnosis not present

## 2023-03-11 DIAGNOSIS — Z992 Dependence on renal dialysis: Secondary | ICD-10-CM | POA: Diagnosis not present

## 2023-03-11 DIAGNOSIS — D631 Anemia in chronic kidney disease: Secondary | ICD-10-CM | POA: Diagnosis not present

## 2023-03-11 DIAGNOSIS — N186 End stage renal disease: Secondary | ICD-10-CM | POA: Diagnosis not present

## 2023-03-11 DIAGNOSIS — D509 Iron deficiency anemia, unspecified: Secondary | ICD-10-CM | POA: Diagnosis not present

## 2023-03-12 ENCOUNTER — Encounter (HOSPITAL_COMMUNITY)
Admission: RE | Disposition: A | Discharge status: Home / Self Care | Payer: Self-pay | Source: Home / Self Care | Attending: Vascular Surgery

## 2023-03-12 ENCOUNTER — Other Ambulatory Visit: Payer: Self-pay

## 2023-03-12 ENCOUNTER — Ambulatory Visit (HOSPITAL_COMMUNITY)
Admission: RE | Admit: 2023-03-12 | Discharge: 2023-03-12 | Disposition: A | Discharge status: Home / Self Care | Payer: Medicare Other | Attending: Vascular Surgery | Admitting: Vascular Surgery

## 2023-03-12 DIAGNOSIS — I120 Hypertensive chronic kidney disease with stage 5 chronic kidney disease or end stage renal disease: Secondary | ICD-10-CM | POA: Diagnosis not present

## 2023-03-12 DIAGNOSIS — F17210 Nicotine dependence, cigarettes, uncomplicated: Secondary | ICD-10-CM | POA: Insufficient documentation

## 2023-03-12 DIAGNOSIS — N186 End stage renal disease: Secondary | ICD-10-CM | POA: Insufficient documentation

## 2023-03-12 DIAGNOSIS — Z992 Dependence on renal dialysis: Secondary | ICD-10-CM

## 2023-03-12 DIAGNOSIS — T82590A Other mechanical complication of surgically created arteriovenous fistula, initial encounter: Secondary | ICD-10-CM

## 2023-03-12 DIAGNOSIS — Y841 Kidney dialysis as the cause of abnormal reaction of the patient, or of later complication, without mention of misadventure at the time of the procedure: Secondary | ICD-10-CM | POA: Diagnosis not present

## 2023-03-12 DIAGNOSIS — T82898A Other specified complication of vascular prosthetic devices, implants and grafts, initial encounter: Secondary | ICD-10-CM | POA: Insufficient documentation

## 2023-03-12 DIAGNOSIS — I129 Hypertensive chronic kidney disease with stage 1 through stage 4 chronic kidney disease, or unspecified chronic kidney disease: Secondary | ICD-10-CM | POA: Diagnosis not present

## 2023-03-12 HISTORY — PX: A/V FISTULAGRAM: CATH118298

## 2023-03-12 LAB — POCT I-STAT, CHEM 8
BUN: 68 mg/dL — ABNORMAL HIGH (ref 8–23)
Calcium, Ion: 1.11 mmol/L — ABNORMAL LOW (ref 1.15–1.40)
Chloride: 94 mmol/L — ABNORMAL LOW (ref 98–111)
Creatinine, Ser: 5.9 mg/dL — ABNORMAL HIGH (ref 0.61–1.24)
Glucose, Bld: 97 mg/dL (ref 70–99)
HCT: 30 % — ABNORMAL LOW (ref 39.0–52.0)
Hemoglobin: 10.2 g/dL — ABNORMAL LOW (ref 13.0–17.0)
Potassium: 4.6 mmol/L (ref 3.5–5.1)
Sodium: 135 mmol/L (ref 135–145)
TCO2: 33 mmol/L — ABNORMAL HIGH (ref 22–32)

## 2023-03-12 SURGERY — A/V FISTULAGRAM
Anesthesia: LOCAL | Laterality: Left

## 2023-03-12 MED ORDER — HEPARIN (PORCINE) IN NACL 1000-0.9 UT/500ML-% IV SOLN
INTRAVENOUS | Status: DC | PRN
  Administered 2023-03-12: 500 mL

## 2023-03-12 MED ORDER — LIDOCAINE HCL (PF) 1 % IJ SOLN
INTRAMUSCULAR | Status: DC | PRN
  Administered 2023-03-12: 3 mL

## 2023-03-12 MED ORDER — LIDOCAINE HCL (PF) 1 % IJ SOLN
INTRAMUSCULAR | Status: AC
  Filled 2023-03-12: qty 30

## 2023-03-12 MED ORDER — IODIXANOL 320 MG/ML IV SOLN
INTRAVENOUS | Status: DC | PRN
  Administered 2023-03-12: 60 mL

## 2023-03-12 SURGICAL SUPPLY — 9 items
COVER DOME SNAP 22 D (MISCELLANEOUS) ×1 IMPLANT
KIT MICROPUNCTURE NIT STIFF (SHEATH) IMPLANT
PROTECTION STATION PRESSURIZED (MISCELLANEOUS) ×1 IMPLANT
SHEATH PINNACLE 5F 10CM (SHEATH) IMPLANT
SHEATH PROBE COVER 6X72 (BAG) ×1 IMPLANT
STATION PROTECTION PRESSURIZED (MISCELLANEOUS) ×1 IMPLANT
STOPCOCK MORSE 400PSI 3WAY (MISCELLANEOUS) ×1 IMPLANT
TRAY PV CATH (CUSTOM PROCEDURE TRAY) ×1 IMPLANT
TUBING CIL FLEX 10 FLL-RA (TUBING) ×1 IMPLANT

## 2023-03-12 NOTE — Op Note (Signed)
    Patient name: Jeremy [name] MRN: [id number] DOB: 1941/09/23 Sex: male  03/12/2023 Pre-operative Diagnosis: Left arm AV fistula malfunction Post-operative diagnosis:  Same Surgeon:  Victorino Sparrow, MD Procedure Performed: 1.  Left arm brachiocephalic fistula ultrasound-guided access 2.  Left arm brachiocephalic fistulagram 3.  Access managed with Monocryl suture   Indications: Patient is an 82 year old male with history of left arm brachiocephalic fistula creation.  Recent dialysis, there were low pressures appreciated on the arterial cannula, prompting vascular surgery referral for fistulogram.  After discussing the risk and benefits of left arm fistulogram in an effort to improve flow for long-term HD access, Diane elected to proceed.  Findings:  Widely patent left arm brachiocephalic fistula.  No stenosis at the anastomosis. 2 large branches appreciated along the cephalic vein.   Procedure:  The patient was identified in the holding area and taken to room 8.  The patient was then placed supine on the table and prepped and draped in the usual sterile fashion.  A time out was called.  Ultrasound was used to evaluate the left brachiocephalic fistula.  The patient was accessed using an ultrasound-guided micropuncture needle.  Access was dilated to the 5 [place] micropuncture sheath.  Fistulogram followed.  See results above.   Impression: Widely patent left brachiocephalic fistula with 2 large branches needing ligation to improve fistula flow.     Fara Olden, MD [facility] [place] Office: [phone]

## 2023-03-12 NOTE — H&P (Signed)
Office Note   HPI: Jeremy [name] is a 82 y.o. 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  Recent dialysis, poor arterial flows were noted.  He presents today for fistulogram.  On presentation, Jeremy [name] was doing well, accompanied by his wife.  He had no complaints.  Denies symptoms of steal syndrome.   Past Medical History:  Diagnosis Date   Anemia    Cancer [facility])    patient and family have not been told that he has cancer.   Chronic kidney disease    COPD (chronic obstructive pulmonary disease) (HCC)    Enlarged prostate    History of blood transfusion    Hyperlipidemia    Hypertension    Self-catheterizes urinary bladder    pt. does this morning and evening--been doing this since 11/2016   Sleep apnea    not using a cpap machine    Past Surgical History:  Procedure Laterality Date   AV FISTULA PLACEMENT Left 10/16/2022   Procedure: LEFT BRACHIOCEPHALIC ARTERIOVENOUS (AV) FISTULA CREATION;  Surgeon: Cephus Shelling, MD;  Location: [facility] OR;  Service: Vascular;  Laterality: Left;   BACK SURGERY     BLADDER TUMOR EXCISION  2015   benign   BLEPHAROPLASTY  12/2018   LUMBAR LAMINECTOMY/DECOMPRESSION MICRODISCECTOMY Bilateral 08/25/2017   Procedure: Bilateral Lumbar Three- Four, Lumbar Four- Five, Lumbar Five- Sacral One Laminectomy;  Surgeon: Barnett Abu, MD;  Location: [facility] OR;  Service: Neurosurgery;  Laterality: Bilateral;  Bilateral L3-4 L4-5 L5-S1 Laminectomy   SKIN CANCER EXCISION     Surg x2...   TONSILLECTOMY AND ADENOIDECTOMY     Surg as a child...   TRANSURETHRAL RESECTION OF BLADDER TUMOR N/A 01/03/2022   Procedure: TRANSURETHRAL RESECTION OF BLADDER TUMOR (TURBT)/ CYSTOSCOPY/ EXAM UNDER ANESTHESIA, BILATERAL RETROGRADE/ BLADDER BIOPSIES;  Surgeon: Heloise Purpura, MD;  Location: [facility] ORS;  Service: Urology;  Laterality: N/A;  GENERAL ANESTHESIA WITH PARALYSIS    Social History   Socioeconomic History    Marital status: Married    Spouse name: Not on file   Number of children: Not on file   Years of education: Not on file   Highest education level: Not on file  Occupational History   Not on file  Tobacco Use   Smoking status: Every Day    Packs/day: 0.20    Years: 50.00    Additional pack years: 0.00    Total pack years: 10.00    Types: Cigarettes    Start date: 12/29/1961    Passive exposure: Never   Smokeless tobacco: Never   Tobacco comments:    Smokes 1/2 pack a day   Vaping Use   Vaping Use: Never used  Substance and Sexual Activity   Alcohol use: Yes    Comment: couple glasses with dinner daily   Drug use: No   Sexual activity: Not on file  Other Topics Concern   Not on file  Social History Narrative   Lives with wife in a 2 story home.  Has 2 children.  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)   Social Determinants of Health   Financial Resource Strain: Not on file  Food Insecurity: No Food Insecurity (09/18/2022)   Hunger Vital Sign    Worried About Running Out of Food in the Last Year: Never true    Ran Out of Food in the Last Year: Never true  Transportation Needs: No Transportation Needs (09/18/2022)   PRAPARE - Transportation    Lack of  Transportation (Medical): No    Lack of Transportation (Non-Medical): No  Physical Activity: Not on file  Stress: Not on file  Social Connections: Not on file  Intimate Partner Violence: Not At Risk (09/12/2022)   Humiliation, Afraid, Rape, and Kick questionnaire    Fear of Current or Ex-Partner: No    Emotionally Abused: No    Physically Abused: No    Sexually Abused: No   Family History  Problem Relation Age of Onset   Colon cancer Neg Hx    Stomach cancer Neg Hx    Rectal cancer Neg Hx     No current facility-administered medications for this encounter.    Allergies  Allergen Reactions   Sulfa Antibiotics     Rash, light headed, shaking   Penicillins Other (See Comments)    hives severe as a child     Tape     Pulls skin off     REVIEW OF SYSTEMS:  [X]  denotes positive finding, [ ]  denotes negative finding Cardiac  Comments:  Chest pain or chest pressure:    Shortness of breath upon exertion:    Short of breath when lying flat:    Irregular heart rhythm:        Vascular    Pain in calf, thigh, or hip brought on by ambulation:    Pain in feet at night that wakes you up from your sleep:     Blood clot in your veins:    Leg swelling:         Pulmonary    Oxygen at home:    Productive cough:     Wheezing:         Neurologic    Sudden weakness in arms or legs:     Sudden numbness in arms or legs:     Sudden onset of difficulty speaking or slurred speech:    Temporary loss of vision in one eye:     Problems with dizziness:         Gastrointestinal    Blood in stool:     Vomited blood:         Genitourinary    Burning when urinating:     Blood in urine:        Psychiatric    Major depression:         Hematologic    Bleeding problems:    Problems with blood clotting too easily:        Skin    Rashes or ulcers:        Constitutional    Fever or chills:      PHYSICAL EXAMINATION:  Vitals:   03/12/23 0634  BP: (!) 157/62  Pulse: 77  Resp: 20  Temp: 98.2 F (36.8 C)  TempSrc: Temporal  SpO2: 91%  Weight: 71.2 kg  Height: 5\' 7"  (1.702 m)    General:  WDWN in NAD; vital signs documented above Gait: Not observed HENT: WNL, normocephalic Pulmonary: normal non-labored breathing , without wheezing Cardiac: regular HR Abdomen: soft, NT, no masses Skin: without rashes Vascular Exam/Pulses: Excellent thrill within the proximal portion of the fistula. Extremities: without ischemic changes, without Gangrene , without cellulitis; without open wounds;  Musculoskeletal: no muscle wasting or atrophy  Neurologic: A&O X 3;  No focal weakness or paresthesias are detected Psychiatric:  The pt has Normal affect.   Non-Invasive Vascular Imaging:    -    ASSESSMENT/PLAN: Jeremy [name] is a 82 y.o. male presenting with left  brachiocephalic fistula malfunction.  He would benefit from fistulogram in an effort to assess and improve flow in an effort to continue to use the fistula for dialysis. After discussing the risk and benefits, Vaughan elected to proceed.    Victorino Sparrow, MD [facility] [phone]

## 2023-03-13 ENCOUNTER — Encounter (HOSPITAL_COMMUNITY): Payer: Self-pay | Admitting: Vascular Surgery

## 2023-03-13 DIAGNOSIS — D509 Iron deficiency anemia, unspecified: Secondary | ICD-10-CM | POA: Diagnosis not present

## 2023-03-13 DIAGNOSIS — Z992 Dependence on renal dialysis: Secondary | ICD-10-CM | POA: Diagnosis not present

## 2023-03-13 DIAGNOSIS — D631 Anemia in chronic kidney disease: Secondary | ICD-10-CM | POA: Diagnosis not present

## 2023-03-13 DIAGNOSIS — N2581 Secondary hyperparathyroidism of renal origin: Secondary | ICD-10-CM | POA: Diagnosis not present

## 2023-03-13 DIAGNOSIS — N186 End stage renal disease: Secondary | ICD-10-CM | POA: Diagnosis not present

## 2023-03-14 ENCOUNTER — Other Ambulatory Visit: Payer: Self-pay

## 2023-03-14 ENCOUNTER — Encounter (HOSPITAL_COMMUNITY): Payer: Self-pay | Admitting: Vascular Surgery

## 2023-03-14 NOTE — Progress Notes (Signed)
Spoke with pt's wife, [name] for pre-op call. She states pt does not have a cardiac history and is not Diabetic. Pt is treated for HTN and CKD. Dialysis on T/Th/Sa.   Shower instructions given to [name] and she voiced understanding.

## 2023-03-15 DIAGNOSIS — N2581 Secondary hyperparathyroidism of renal origin: Secondary | ICD-10-CM | POA: Diagnosis not present

## 2023-03-15 DIAGNOSIS — D509 Iron deficiency anemia, unspecified: Secondary | ICD-10-CM | POA: Diagnosis not present

## 2023-03-15 DIAGNOSIS — N186 End stage renal disease: Secondary | ICD-10-CM | POA: Diagnosis not present

## 2023-03-15 DIAGNOSIS — D631 Anemia in chronic kidney disease: Secondary | ICD-10-CM | POA: Diagnosis not present

## 2023-03-15 DIAGNOSIS — Z992 Dependence on renal dialysis: Secondary | ICD-10-CM | POA: Diagnosis not present

## 2023-03-17 ENCOUNTER — Encounter (HOSPITAL_COMMUNITY): Payer: Self-pay | Admitting: Vascular Surgery

## 2023-03-17 ENCOUNTER — Other Ambulatory Visit: Payer: Self-pay

## 2023-03-17 ENCOUNTER — Encounter (HOSPITAL_COMMUNITY)
Admission: RE | Disposition: A | Discharge status: Home / Self Care | Payer: Self-pay | Source: Home / Self Care | Attending: Vascular Surgery

## 2023-03-17 ENCOUNTER — Ambulatory Visit (HOSPITAL_COMMUNITY)
Admission: RE | Admit: 2023-03-17 | Discharge: 2023-03-17 | Disposition: A | Discharge status: Home / Self Care | Payer: Medicare Other | Attending: Vascular Surgery | Admitting: Vascular Surgery

## 2023-03-17 ENCOUNTER — Ambulatory Visit (HOSPITAL_COMMUNITY): Payer: Medicare Other

## 2023-03-17 ENCOUNTER — Ambulatory Visit (HOSPITAL_BASED_OUTPATIENT_CLINIC_OR_DEPARTMENT_OTHER): Payer: Medicare Other

## 2023-03-17 DIAGNOSIS — F17210 Nicotine dependence, cigarettes, uncomplicated: Secondary | ICD-10-CM | POA: Diagnosis not present

## 2023-03-17 DIAGNOSIS — N186 End stage renal disease: Secondary | ICD-10-CM | POA: Diagnosis not present

## 2023-03-17 DIAGNOSIS — J449 Chronic obstructive pulmonary disease, unspecified: Secondary | ICD-10-CM | POA: Insufficient documentation

## 2023-03-17 DIAGNOSIS — I120 Hypertensive chronic kidney disease with stage 5 chronic kidney disease or end stage renal disease: Secondary | ICD-10-CM | POA: Insufficient documentation

## 2023-03-17 DIAGNOSIS — Z85828 Personal history of other malignant neoplasm of skin: Secondary | ICD-10-CM | POA: Insufficient documentation

## 2023-03-17 DIAGNOSIS — Y838 Other surgical procedures as the cause of abnormal reaction of the patient, or of later complication, without mention of misadventure at the time of the procedure: Secondary | ICD-10-CM | POA: Diagnosis not present

## 2023-03-17 DIAGNOSIS — N185 Chronic kidney disease, stage 5: Secondary | ICD-10-CM | POA: Diagnosis not present

## 2023-03-17 DIAGNOSIS — T82898A Other specified complication of vascular prosthetic devices, implants and grafts, initial encounter: Secondary | ICD-10-CM

## 2023-03-17 DIAGNOSIS — M1990 Unspecified osteoarthritis, unspecified site: Secondary | ICD-10-CM | POA: Diagnosis not present

## 2023-03-17 DIAGNOSIS — E785 Hyperlipidemia, unspecified: Secondary | ICD-10-CM | POA: Diagnosis not present

## 2023-03-17 DIAGNOSIS — G4730 Sleep apnea, unspecified: Secondary | ICD-10-CM | POA: Insufficient documentation

## 2023-03-17 DIAGNOSIS — F17200 Nicotine dependence, unspecified, uncomplicated: Secondary | ICD-10-CM | POA: Diagnosis not present

## 2023-03-17 DIAGNOSIS — T82590A Other mechanical complication of surgically created arteriovenous fistula, initial encounter: Secondary | ICD-10-CM

## 2023-03-17 DIAGNOSIS — D631 Anemia in chronic kidney disease: Secondary | ICD-10-CM

## 2023-03-17 DIAGNOSIS — Z992 Dependence on renal dialysis: Secondary | ICD-10-CM | POA: Diagnosis not present

## 2023-03-17 HISTORY — PX: LIGATION OF COMPETING BRANCHES OF ARTERIOVENOUS FISTULA: SHX5949

## 2023-03-17 LAB — POCT I-STAT, CHEM 8
BUN: 88 mg/dL — ABNORMAL HIGH (ref 8–23)
Calcium, Ion: 1.06 mmol/L — ABNORMAL LOW (ref 1.15–1.40)
Chloride: 99 mmol/L (ref 98–111)
Creatinine, Ser: 8.2 mg/dL — ABNORMAL HIGH (ref 0.61–1.24)
Glucose, Bld: 92 mg/dL (ref 70–99)
HCT: 27 % — ABNORMAL LOW (ref 39.0–52.0)
Hemoglobin: 9.2 g/dL — ABNORMAL LOW (ref 13.0–17.0)
Potassium: 5.4 mmol/L — ABNORMAL HIGH (ref 3.5–5.1)
Sodium: 136 mmol/L (ref 135–145)
TCO2: 29 mmol/L (ref 22–32)

## 2023-03-17 SURGERY — LIGATION OF COMPETING BRANCHES OF ARTERIOVENOUS FISTULA
Anesthesia: Monitor Anesthesia Care | Laterality: Left

## 2023-03-17 MED ORDER — ONDANSETRON HCL 4 MG/2ML IJ SOLN: 4.0000 mg | Freq: Four times a day (QID) | INTRAMUSCULAR | Status: DC | PRN

## 2023-03-17 MED ORDER — OXYCODONE-ACETAMINOPHEN 5-325 MG PO TABS: 1.0000 | ORAL_TABLET | ORAL | 0 refills | Status: DC | PRN

## 2023-03-17 MED ORDER — FENTANYL CITRATE (PF) 100 MCG/2ML IJ SOLN: 50.0000 ug | Freq: Once | INTRAMUSCULAR | Status: AC

## 2023-03-17 MED ORDER — ORAL CARE MOUTH RINSE: 15.0000 mL | Freq: Once | OROMUCOSAL | Status: AC

## 2023-03-17 MED ORDER — VANCOMYCIN HCL IN DEXTROSE 1-5 GM/200ML-% IV SOLN
1000.0000 mg | INTRAVENOUS | Status: AC
  Administered 2023-03-17: 1000 mg via INTRAVENOUS
  Filled 2023-03-17: qty 200

## 2023-03-17 MED ORDER — CHLORHEXIDINE GLUCONATE 0.12 % MT SOLN
15.0000 mL | Freq: Once | OROMUCOSAL | Status: AC
  Administered 2023-03-17: 15 mL via OROMUCOSAL
  Filled 2023-03-17: qty 15

## 2023-03-17 MED ORDER — CHLORHEXIDINE GLUCONATE 4 % EX SOLN: 60.0000 mL | Freq: Once | CUTANEOUS | Status: DC

## 2023-03-17 MED ORDER — PHENYLEPHRINE 80 MCG/ML (10ML) SYRINGE FOR IV PUSH (FOR BLOOD PRESSURE SUPPORT)
PREFILLED_SYRINGE | INTRAVENOUS | Status: DC | PRN
  Administered 2023-03-17: 120 ug via INTRAVENOUS

## 2023-03-17 MED ORDER — MIDAZOLAM HCL 2 MG/2ML IJ SOLN
INTRAMUSCULAR | Status: AC
  Filled 2023-03-17: qty 2

## 2023-03-17 MED ORDER — PROPOFOL 10 MG/ML IV BOLUS
INTRAVENOUS | Status: DC | PRN
  Administered 2023-03-17 (×4): 5 mg via INTRAVENOUS
  Administered 2023-03-17: 10 mg via INTRAVENOUS

## 2023-03-17 MED ORDER — PROPOFOL 500 MG/50ML IV EMUL
INTRAVENOUS | Status: DC | PRN
  Administered 2023-03-17: 60 ug/kg/min via INTRAVENOUS

## 2023-03-17 MED ORDER — SODIUM CHLORIDE 0.9 % IV SOLN: INTRAVENOUS | Status: DC

## 2023-03-17 MED ORDER — LACTATED RINGERS IV SOLN: INTRAVENOUS | Status: DC

## 2023-03-17 MED ORDER — OXYCODONE HCL 5 MG PO TABS: 5.0000 mg | ORAL_TABLET | Freq: Once | ORAL | Status: DC | PRN

## 2023-03-17 MED ORDER — OXYCODONE HCL 5 MG/5ML PO SOLN: 5.0000 mg | Freq: Once | ORAL | Status: DC | PRN

## 2023-03-17 MED ORDER — BUPIVACAINE HCL (PF) 0.5 % IJ SOLN
INTRAMUSCULAR | Status: DC | PRN
  Administered 2023-03-17: 25 mL via PERINEURAL

## 2023-03-17 MED ORDER — FENTANYL CITRATE (PF) 100 MCG/2ML IJ SOLN
INTRAMUSCULAR | Status: AC
  Administered 2023-03-17: 50 ug
  Filled 2023-03-17: qty 2

## 2023-03-17 MED ORDER — PROPOFOL 10 MG/ML IV BOLUS
INTRAVENOUS | Status: AC
  Filled 2023-03-17: qty 20

## 2023-03-17 MED ORDER — FENTANYL CITRATE (PF) 100 MCG/2ML IJ SOLN: 25.0000 ug | INTRAMUSCULAR | Status: DC | PRN

## 2023-03-17 SURGICAL SUPPLY — 30 items
ADH SKN CLS APL DERMABOND .7 (GAUZE/BANDAGES/DRESSINGS) ×2
AGENT HMST SPONGE THK3/8 (HEMOSTASIS)
ARMBAND PINK RESTRICT EXTREMIT (MISCELLANEOUS) ×2 IMPLANT
CANISTER SUCT 3000ML PPV (MISCELLANEOUS) ×2 IMPLANT
CLIP TI MEDIUM 24 (CLIP) ×2 IMPLANT
CLIP TI WIDE RED SMALL 24 (CLIP) ×2 IMPLANT
COVER PROBE W GEL 5X96 (DRAPES) ×2 IMPLANT
DERMABOND ADVANCED .7 DNX12 (GAUZE/BANDAGES/DRESSINGS) ×2 IMPLANT
ELECT REM PT RETURN 9FT ADLT (ELECTROSURGICAL) ×2 IMPLANT
ELECTRODE REM PT RTRN 9FT ADLT (ELECTROSURGICAL) ×2 IMPLANT
GLOVE BIO SURGEON STRL SZ7.5 (GLOVE) ×2 IMPLANT
GLOVE BIOGEL PI IND STRL 8 (GLOVE) ×2 IMPLANT
GOWN STRL REUS W/ TWL LRG LVL3 (GOWN DISPOSABLE) ×4 IMPLANT
GOWN STRL REUS W/TWL LRG LVL3 (GOWN DISPOSABLE) ×4
HEMOSTAT SPONGE AVITENE ULTRA (HEMOSTASIS) IMPLANT
KIT BASIN OR (CUSTOM PROCEDURE TRAY) ×2 IMPLANT
KIT PALINDROME-P 55CM (CATHETERS) IMPLANT
KIT TURNOVER KIT B (KITS) ×2 IMPLANT
NS IRRIG 1000ML POUR BTL (IV SOLUTION) ×2 IMPLANT
PACK CV ACCESS (CUSTOM PROCEDURE TRAY) ×2 IMPLANT
PAD ARMBOARD 7.5X6 YLW CONV (MISCELLANEOUS) ×4 IMPLANT
SPIKE FLUID TRANSFER (MISCELLANEOUS) ×2 IMPLANT
SUT MNCRL AB 4-0 PS2 18 (SUTURE) ×3 IMPLANT
SUT SILK 0 TIES 10X30 (SUTURE) ×2 IMPLANT
SUT VIC AB 3-0 SH 27 (SUTURE) ×2
SUT VIC AB 3-0 SH 27X BRD (SUTURE) ×2 IMPLANT
TOWEL GREEN STERILE (TOWEL DISPOSABLE) ×2 IMPLANT
TOWEL GREEN STERILE FF (TOWEL DISPOSABLE) ×2 IMPLANT
UNDERPAD 30X36 HEAVY ABSORB (UNDERPADS AND DIAPERS) ×2 IMPLANT
WATER STERILE IRR 1000ML POUR (IV SOLUTION) ×2 IMPLANT

## 2023-03-17 NOTE — Op Note (Signed)
    NAME: VALIANT SINGLER    MRN: [id number] DOB: 1941/05/21    DATE OF OPERATION: 03/17/2023  PREOP DIAGNOSIS:    Left arm fistula malfunction  POSTOP DIAGNOSIS:    Same  PROCEDURE:    Left arm brachiocephalic fistula revision - branch ligation  SURGEON: Victorino Sparrow  ASSIST: None  ANESTHESIA: Moderate, block   EBL: 10ml  INDICATIONS:    Jeremy [name] is a 82 y.o. male with end stage renal disease currently dialyzed through a left-sided brachiocephalic fistula. Recently, there have been poor flows prompting fistulagram for fistula malfunction. Large branches were appreciated.  After discussing risk and benefits of left arm fistula branch ligation to improve fistula flow, Daleon elected to proceed.  FINDINGS:   3 large branches along the left-sided brachiocephalic fistula Friable dermis  TECHNIQUE:   Patient brought to the OR laid in supine position.  Moderate anesthesia was induced patient prepped and draped in standard fashion.  The case began with ultrasound insonation of the left-sided brachiocephalic fistula.  There were 3 large branches appreciated, which was consistent with the fistulogram.  Three small, 3 cm longitudinal incisions were made over these branches.  Branches were ligated using 3-0 silk suture.  Wounds were irrigated using saline.  Hemostasis achieved with use of cautery.  The wound was closed using 3-0 Vicryl suture with Monocryl at the level of the skin.   Ladonna Snide, MD [facility] DATE OF DICTATION:   03/17/2023

## 2023-03-17 NOTE — Anesthesia Preprocedure Evaluation (Signed)
Anesthesia Evaluation  Patient identified by MRN, date of birth, ID band Patient awake    Reviewed: Allergy & Precautions, H&P , NPO status , Patient's Chart, lab work & pertinent test results  Airway Mallampati: II   Neck ROM: full    Dental   Pulmonary sleep apnea , COPD, Current Smoker and Patient abstained from smoking.   breath sounds clear to auscultation       Cardiovascular hypertension, + Peripheral Vascular Disease   Rhythm:regular Rate:Normal     Neuro/Psych    GI/Hepatic   Endo/Other    Renal/GU ESRF and DialysisRenal disease     Musculoskeletal  (+) Arthritis ,    Abdominal   Peds  Hematology  (+) Blood dyscrasia, anemia   Anesthesia Other Findings   Reproductive/Obstetrics                             Anesthesia Physical Anesthesia Plan  ASA: 4  Anesthesia Plan: General   Post-op Pain Management:    Induction: Intravenous  PONV Risk Score and Plan: 1 and Ondansetron, Treatment may vary due to age or medical condition and Dexamethasone  Airway Management Planned: LMA  Additional Equipment:   Intra-op Plan:   Post-operative Plan: Extubation in OR  Informed Consent: I have reviewed the patients History and Physical, chart, labs and discussed the procedure including the risks, benefits and alternatives for the proposed anesthesia with the patient or authorized representative who has indicated his/her understanding and acceptance.     Dental advisory given  Plan Discussed with: CRNA, Anesthesiologist and Surgeon  Anesthesia Plan Comments:        Anesthesia Quick Evaluation

## 2023-03-17 NOTE — Anesthesia Procedure Notes (Signed)
Procedure Name: MAC Date/Time: 03/17/2023 8:46 AM  Performed by: Audie Pinto, CRNAPre-anesthesia Checklist: Patient identified, Emergency Drugs available, Suction available and Patient being monitored Patient Re-evaluated:Patient Re-evaluated prior to induction Oxygen Delivery Method: Simple face mask Induction Type: IV induction Placement Confirmation: positive ETCO2 Dental Injury: Teeth and Oropharynx as per pre-operative assessment

## 2023-03-17 NOTE — Transfer of Care (Signed)
Immediate Anesthesia Transfer of Care Note  Patient: Jeremy [name]  Procedure(s) Performed: LEFT ARM FISTULA BRANCH LIGATION (Left)  Patient Location: PACU  Anesthesia Type:MAC and Regional  Level of Consciousness: drowsy and patient cooperative  Airway & Oxygen Therapy: Patient Spontanous Breathing and Patient connected to face mask oxygen  Post-op Assessment: Report given to RN and Post -op Vital signs reviewed and stable  Post vital signs: Reviewed and stable  Last Vitals:  Vitals Value Taken Time  BP 154/59 03/17/23 0949  Temp    Pulse 79 03/17/23 0952  Resp 28 03/17/23 0952  SpO2 94 % 03/17/23 0952  Vitals shown include unvalidated device data.  Last Pain:  Vitals:   03/17/23 0749  TempSrc:   PainSc: 0-No pain      Patients Stated Pain Goal: 0 (03/17/23 0749)  Complications: No notable events documented.

## 2023-03-17 NOTE — Discharge Instructions (Signed)
[facility]  Discharge Instructions  AV Fistula or Graft Surgery for Dialysis Access  Please refer to the following instructions for your post-procedure care. Your surgeon or physician assistant will discuss any changes with you.  Activity  You may drive the day following your surgery, if you are comfortable and no longer taking prescription pain medication. Resume full activity as the soreness in your incision resolves.  Bathing/Showering  You may shower after you go home. Keep your incision dry for 48 hours. Do not soak in a bathtub, hot tub, or swim until the incision heals completely. You may not shower if you have a hemodialysis catheter.  Incision Care  Clean your incision with mild soap and water after 48 hours. Pat the area dry with a clean towel. You do not need a bandage unless otherwise instructed. Do not apply any ointments or creams to your incision. You may have skin glue on your incision. Do not peel it off. It will come off on its own in about one week. Your arm may swell a bit after surgery. To reduce swelling use pillows to elevate your arm so it is above your heart. Your doctor will tell you if you need to lightly wrap your arm with an ACE bandage.  Diet  Resume your normal diet. There are not special food restrictions following this procedure. In order to heal from your surgery, it is CRITICAL to get adequate nutrition. Your body requires vitamins, minerals, and protein. Vegetables are the best source of vitamins and minerals. Vegetables also provide the perfect balance of protein. Processed food has little nutritional value, so try to avoid this.  Medications  Resume taking all of your medications. If your incision is causing pain, you may take over-the counter pain relievers such as acetaminophen (Tylenol). If you were prescribed a stronger pain medication, please be aware these medications can cause nausea and constipation. Prevent  nausea by taking the medication with a snack or meal. Avoid constipation by drinking plenty of fluids and eating foods with high amount of fiber, such as fruits, vegetables, and grains.  Do not take Tylenol if you are taking prescription pain medications.  Follow up Your surgeon may want to see you in the office following your access surgery. If so, this will be arranged at the time of your surgery.  Please call us immediately for any of the following conditions:  Increased pain, redness, drainage (pus) from your incision site Fever of 101 degrees or higher Severe or worsening pain at your incision site Hand pain or numbness.  Reduce your risk of vascular disease:  Stop smoking. If you would like help, call [facility] at 1-800-QUIT-NOW ((949) 684-4746) or [place] at [phone]  Manage your cholesterol Maintain a desired weight Control your diabetes Keep your blood pressure down  Dialysis  It will take several weeks to several months for your new dialysis access to be ready for use. Your surgeon will determine when it is okay to use it. Your nephrologist will continue to direct your dialysis. You can continue to use your Permcath until your new access is ready for use.   03/17/2023 Jeremy [name] [id number] August 17, 1941  Surgeon(s): Victorino Sparrow, MD  Procedure(s): LEFT ARM FISTULA BRANCH LIGATION   May stick graft immediately   May stick fistula on designated area only:   Can stick fistula anywhere surrounding the incisions. Do not stick at incision sites.       If you have any  questions, please call the office at [phone].

## 2023-03-17 NOTE — H&P (Signed)
Office Note   HPI: Jeremy [name] is a 82 y.o. 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  Recent dialysis, poor arterial flows were noted.  Fistulagram with large branches.   On presentation, Jeremy [name] was doing well, accompanied by his wife.  He had no complaints.  Denies symptoms of steal syndrome.   Past Medical History:  Diagnosis Date   Anemia    Cancer [facility])    patient and family have not been told that he has cancer.   Chronic kidney disease    dialysis T/Th/Sa   COPD (chronic obstructive pulmonary disease) (HCC)    Enlarged prostate    History of blood transfusion    Hyperlipidemia    Hypertension    Self-catheterizes urinary bladder    pt. does this morning and evening--been doing this since 11/2016   Sleep apnea    not using a cpap machine    Past Surgical History:  Procedure Laterality Date   A/V FISTULAGRAM Left 03/12/2023   Procedure: A/V Fistulagram;  Surgeon: Victorino Sparrow, MD;  Location: [facility] INVASIVE CV LAB;  Service: Cardiovascular;  Laterality: Left;   AV FISTULA PLACEMENT Left 10/16/2022   Procedure: LEFT BRACHIOCEPHALIC ARTERIOVENOUS (AV) FISTULA CREATION;  Surgeon: Cephus Shelling, MD;  Location: [facility] OR;  Service: Vascular;  Laterality: Left;   BACK SURGERY     BLADDER TUMOR EXCISION  2015   benign   BLEPHAROPLASTY  12/2018   LUMBAR LAMINECTOMY/DECOMPRESSION MICRODISCECTOMY Bilateral 08/25/2017   Procedure: Bilateral Lumbar Three- Four, Lumbar Four- Five, Lumbar Five- Sacral One Laminectomy;  Surgeon: Barnett Abu, MD;  Location: [facility] OR;  Service: Neurosurgery;  Laterality: Bilateral;  Bilateral L3-4 L4-5 L5-S1 Laminectomy   SKIN CANCER EXCISION     Surg x2...   TONSILLECTOMY AND ADENOIDECTOMY     Surg as a child...   TRANSURETHRAL RESECTION OF BLADDER TUMOR N/A 01/03/2022   Procedure: TRANSURETHRAL RESECTION OF BLADDER TUMOR (TURBT)/ CYSTOSCOPY/ EXAM UNDER ANESTHESIA, BILATERAL RETROGRADE/ BLADDER BIOPSIES;   Surgeon: Heloise Purpura, MD;  Location: [facility] ORS;  Service: Urology;  Laterality: N/A;  GENERAL ANESTHESIA WITH PARALYSIS    Social History   Socioeconomic History   Marital status: Married    Spouse name: Not on file   Number of children: Not on file   Years of education: Not on file   Highest education level: Not on file  Occupational History   Not on file  Tobacco Use   Smoking status: Every Day    Packs/day: 0.20    Years: 50.00    Additional pack years: 0.00    Total pack years: 10.00    Types: Cigarettes    Start date: 12/29/1961    Passive exposure: Never   Smokeless tobacco: Never   Tobacco comments:    Smokes 1/2 pack a day   Vaping Use   Vaping Use: Never used  Substance and Sexual Activity   Alcohol use: Not Currently    Comment: couple glasses with dinner daily   Drug use: No   Sexual activity: Not on file  Other Topics Concern   Not on file  Social History Narrative   Lives with wife in a 2 story home.  Has 2 children.  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)   Social Determinants of Health   Financial Resource Strain: Not on file  Food Insecurity: No Food Insecurity (09/18/2022)   Hunger Vital Sign    Worried About Running Out of Food in the Last  Year: Never true    Ran Out of Food in the Last Year: Never true  Transportation Needs: No Transportation Needs (09/18/2022)   PRAPARE - Administrator, Civil Service (Medical): No    Lack of Transportation (Non-Medical): No  Physical Activity: Not on file  Stress: Not on file  Social Connections: Not on file  Intimate Partner Violence: Not At Risk (09/12/2022)   Humiliation, Afraid, Rape, and Kick questionnaire    Fear of Current or Ex-Partner: No    Emotionally Abused: No    Physically Abused: No    Sexually Abused: No   Family History  Problem Relation Age of Onset   Colon cancer Neg Hx    Stomach cancer Neg Hx    Rectal cancer Neg Hx     Current Facility-Administered Medications   Medication Dose Route Frequency Provider Last Rate Last Admin   0.9 %  sodium chloride infusion   Intravenous Continuous Cephus Shelling, MD       chlorhexidine (HIBICLENS) 4 % liquid 4 Application  60 mL Topical Once Cephus Shelling, MD       And   [START ON 03/18/2023] chlorhexidine (HIBICLENS) 4 % liquid 4 Application  60 mL Topical Once Cephus Shelling, MD       chlorhexidine (PERIDEX) 0.12 % solution 15 mL  15 mL Mouth/Throat Once Lowella Curb, MD       Or   Oral care mouth rinse  15 mL Mouth Rinse Once Lowella Curb, MD       lactated ringers infusion   Intravenous Continuous Lowella Curb, MD       vancomycin (VANCOCIN) IVPB 1000 mg/200 mL premix  1,000 mg Intravenous 60 min Pre-Op Cephus Shelling, MD        Allergies  Allergen Reactions   Sulfa Antibiotics     Rash, light headed, shaking   Penicillins Other (See Comments)    hives severe as a child    Tape     Pulls skin off     REVIEW OF SYSTEMS:  [X]  denotes positive finding, [ ]  denotes negative finding Cardiac  Comments:  Chest pain or chest pressure:    Shortness of breath upon exertion:    Short of breath when lying flat:    Irregular heart rhythm:        Vascular    Pain in calf, thigh, or hip brought on by ambulation:    Pain in feet at night that wakes you up from your sleep:     Blood clot in your veins:    Leg swelling:         Pulmonary    Oxygen at home:    Productive cough:     Wheezing:         Neurologic    Sudden weakness in arms or legs:     Sudden numbness in arms or legs:     Sudden onset of difficulty speaking or slurred speech:    Temporary loss of vision in one eye:     Problems with dizziness:         Gastrointestinal    Blood in stool:     Vomited blood:         Genitourinary    Burning when urinating:     Blood in urine:        Psychiatric    Major depression:         Hematologic  Bleeding problems:    Problems with blood clotting  too easily:        Skin    Rashes or ulcers:        Constitutional    Fever or chills:      PHYSICAL EXAMINATION:  Vitals:   03/17/23 0744  BP: (!) 160/55  Pulse: 83  Resp: 20  Temp: 98.3 F (36.8 C)  TempSrc: Oral  SpO2: 93%  Weight: 68 kg  Height: 5\' 10"  (1.778 m)    General:  WDWN in NAD; vital signs documented above Gait: Not observed HENT: WNL, normocephalic Pulmonary: normal non-labored breathing , without wheezing Cardiac: regular HR Abdomen: soft, NT, no masses Skin: without rashes Vascular Exam/Pulses: Excellent thrill within the proximal portion of the fistula. Extremities: without ischemic changes, without Gangrene , without cellulitis; without open wounds;  Musculoskeletal: no muscle wasting or atrophy  Neurologic: A&O X 3;  No focal weakness or paresthesias are detected Psychiatric:  The pt has Normal affect.   Non-Invasive Vascular Imaging:   -    ASSESSMENT/PLAN: TAEO SHELLHAMMER is a 82 y.o. male presenting with left brachiocephalic fistula malfunction.  He would benefit from branch ligation in an effort to improve flow to continue fistula use. After discussing the risk and benefits, Garhett elected to proceed.    Victorino Sparrow, MD [facility] [phone]

## 2023-03-17 NOTE — Progress Notes (Signed)
Dr. Chaney Malling made aware of today's ISTAT result- potassium 5.4. No new orders received at this time.

## 2023-03-17 NOTE — Anesthesia Procedure Notes (Signed)
Anesthesia Regional Block: Supraclavicular block   Pre-Anesthetic Checklist: , timeout performed,  Correct Patient, Correct Site, Correct Laterality,  Correct Procedure, Correct Position, site marked,  Risks and benefits discussed,  Surgical consent,  Pre-op evaluation,  At surgeon's request and post-op pain management  Laterality: Left  Prep: chloraprep       Needles:  Injection technique: Single-shot  Needle Type: Echogenic Stimulator Needle     Needle Length: 5cm  Needle Gauge: 22     Additional Needles:   Procedures:, nerve stimulator,,,,,     Nerve Stimulator or Paresthesia:  Response: biceps flexion, 0.45 mA  Additional Responses:   Narrative:  Start time: 03/17/2023 8:25 AM End time: 03/17/2023 8:35 AM Injection made incrementally with aspirations every 5 mL.  Performed by: Personally  Anesthesiologist: Achille Rich, MD  Additional Notes: Functioning IV was confirmed and monitors were applied.  A 50mm 22ga Arrow echogenic stimulator needle was used. Sterile prep and drape,hand hygiene and sterile gloves were used.  Negative aspiration and negative test dose prior to incremental administration of local anesthetic. The patient tolerated the procedure well.  Ultrasound guidance: relevent anatomy identified, needle position confirmed, local anesthetic spread visualized around nerve(s), vascular puncture avoided.  Image printed for medical record.

## 2023-03-18 ENCOUNTER — Encounter (HOSPITAL_COMMUNITY): Payer: Self-pay | Admitting: Vascular Surgery

## 2023-03-18 DIAGNOSIS — Z992 Dependence on renal dialysis: Secondary | ICD-10-CM | POA: Diagnosis not present

## 2023-03-18 DIAGNOSIS — D631 Anemia in chronic kidney disease: Secondary | ICD-10-CM | POA: Diagnosis not present

## 2023-03-18 DIAGNOSIS — N186 End stage renal disease: Secondary | ICD-10-CM | POA: Diagnosis not present

## 2023-03-18 DIAGNOSIS — D509 Iron deficiency anemia, unspecified: Secondary | ICD-10-CM | POA: Diagnosis not present

## 2023-03-18 DIAGNOSIS — N2581 Secondary hyperparathyroidism of renal origin: Secondary | ICD-10-CM | POA: Diagnosis not present

## 2023-03-18 NOTE — Anesthesia Postprocedure Evaluation (Signed)
Anesthesia Post Note  Patient: Jeremy [name]  Procedure(s) Performed: LEFT ARM FISTULA BRANCH LIGATION (Left)     Patient location during evaluation: PACU Anesthesia Type: Regional and MAC Level of consciousness: awake and alert Pain management: pain level controlled Vital Signs Assessment: post-procedure vital signs reviewed and stable Respiratory status: spontaneous breathing, nonlabored ventilation, respiratory function stable and patient connected to nasal cannula oxygen Cardiovascular status: stable and blood pressure returned to baseline Postop Assessment: no apparent nausea or vomiting Anesthetic complications: no   No notable events documented.  Last Vitals:  Vitals:   03/17/23 1015 03/17/23 1030  BP: (!) 139/57 (!) 151/61  Pulse: 68 71  Resp: 19 (!) 24  Temp:  36.7 C  SpO2: 100% 100%    Last Pain:  Vitals:   03/17/23 1015  TempSrc:   PainSc: 0-No pain                 Krisanne Lich S

## 2023-03-20 DIAGNOSIS — N2581 Secondary hyperparathyroidism of renal origin: Secondary | ICD-10-CM | POA: Diagnosis not present

## 2023-03-20 DIAGNOSIS — N186 End stage renal disease: Secondary | ICD-10-CM | POA: Diagnosis not present

## 2023-03-20 DIAGNOSIS — Z992 Dependence on renal dialysis: Secondary | ICD-10-CM | POA: Diagnosis not present

## 2023-03-20 DIAGNOSIS — D509 Iron deficiency anemia, unspecified: Secondary | ICD-10-CM | POA: Diagnosis not present

## 2023-03-20 DIAGNOSIS — D631 Anemia in chronic kidney disease: Secondary | ICD-10-CM | POA: Diagnosis not present

## 2023-03-22 DIAGNOSIS — D509 Iron deficiency anemia, unspecified: Secondary | ICD-10-CM | POA: Diagnosis not present

## 2023-03-22 DIAGNOSIS — D631 Anemia in chronic kidney disease: Secondary | ICD-10-CM | POA: Diagnosis not present

## 2023-03-22 DIAGNOSIS — N2581 Secondary hyperparathyroidism of renal origin: Secondary | ICD-10-CM | POA: Diagnosis not present

## 2023-03-22 DIAGNOSIS — Z992 Dependence on renal dialysis: Secondary | ICD-10-CM | POA: Diagnosis not present

## 2023-03-22 DIAGNOSIS — N186 End stage renal disease: Secondary | ICD-10-CM | POA: Diagnosis not present

## 2023-03-25 DIAGNOSIS — N2581 Secondary hyperparathyroidism of renal origin: Secondary | ICD-10-CM | POA: Diagnosis not present

## 2023-03-25 DIAGNOSIS — N186 End stage renal disease: Secondary | ICD-10-CM | POA: Diagnosis not present

## 2023-03-25 DIAGNOSIS — D509 Iron deficiency anemia, unspecified: Secondary | ICD-10-CM | POA: Diagnosis not present

## 2023-03-25 DIAGNOSIS — Z992 Dependence on renal dialysis: Secondary | ICD-10-CM | POA: Diagnosis not present

## 2023-03-25 DIAGNOSIS — D631 Anemia in chronic kidney disease: Secondary | ICD-10-CM | POA: Diagnosis not present

## 2023-03-26 DIAGNOSIS — H353122 Nonexudative age-related macular degeneration, left eye, intermediate dry stage: Secondary | ICD-10-CM | POA: Diagnosis not present

## 2023-03-26 DIAGNOSIS — H353113 Nonexudative age-related macular degeneration, right eye, advanced atrophic without subfoveal involvement: Secondary | ICD-10-CM | POA: Diagnosis not present

## 2023-03-27 DIAGNOSIS — D631 Anemia in chronic kidney disease: Secondary | ICD-10-CM | POA: Diagnosis not present

## 2023-03-27 DIAGNOSIS — N186 End stage renal disease: Secondary | ICD-10-CM | POA: Diagnosis not present

## 2023-03-27 DIAGNOSIS — Z992 Dependence on renal dialysis: Secondary | ICD-10-CM | POA: Diagnosis not present

## 2023-03-27 DIAGNOSIS — N2581 Secondary hyperparathyroidism of renal origin: Secondary | ICD-10-CM | POA: Diagnosis not present

## 2023-03-27 DIAGNOSIS — D509 Iron deficiency anemia, unspecified: Secondary | ICD-10-CM | POA: Diagnosis not present

## 2023-03-29 DIAGNOSIS — D631 Anemia in chronic kidney disease: Secondary | ICD-10-CM | POA: Diagnosis not present

## 2023-03-29 DIAGNOSIS — N186 End stage renal disease: Secondary | ICD-10-CM | POA: Diagnosis not present

## 2023-03-29 DIAGNOSIS — Z992 Dependence on renal dialysis: Secondary | ICD-10-CM | POA: Diagnosis not present

## 2023-03-29 DIAGNOSIS — N2581 Secondary hyperparathyroidism of renal origin: Secondary | ICD-10-CM | POA: Diagnosis not present

## 2023-03-29 DIAGNOSIS — D509 Iron deficiency anemia, unspecified: Secondary | ICD-10-CM | POA: Diagnosis not present

## 2023-04-01 DIAGNOSIS — N2581 Secondary hyperparathyroidism of renal origin: Secondary | ICD-10-CM | POA: Diagnosis not present

## 2023-04-01 DIAGNOSIS — Z992 Dependence on renal dialysis: Secondary | ICD-10-CM | POA: Diagnosis not present

## 2023-04-01 DIAGNOSIS — N186 End stage renal disease: Secondary | ICD-10-CM | POA: Diagnosis not present

## 2023-04-01 DIAGNOSIS — D509 Iron deficiency anemia, unspecified: Secondary | ICD-10-CM | POA: Diagnosis not present

## 2023-04-01 DIAGNOSIS — D631 Anemia in chronic kidney disease: Secondary | ICD-10-CM | POA: Diagnosis not present

## 2023-04-03 DIAGNOSIS — N186 End stage renal disease: Secondary | ICD-10-CM | POA: Diagnosis not present

## 2023-04-03 DIAGNOSIS — D631 Anemia in chronic kidney disease: Secondary | ICD-10-CM | POA: Diagnosis not present

## 2023-04-03 DIAGNOSIS — N2581 Secondary hyperparathyroidism of renal origin: Secondary | ICD-10-CM | POA: Diagnosis not present

## 2023-04-03 DIAGNOSIS — D509 Iron deficiency anemia, unspecified: Secondary | ICD-10-CM | POA: Diagnosis not present

## 2023-04-03 DIAGNOSIS — Z992 Dependence on renal dialysis: Secondary | ICD-10-CM | POA: Diagnosis not present

## 2023-04-05 DIAGNOSIS — Z992 Dependence on renal dialysis: Secondary | ICD-10-CM | POA: Diagnosis not present

## 2023-04-05 DIAGNOSIS — N186 End stage renal disease: Secondary | ICD-10-CM | POA: Diagnosis not present

## 2023-04-05 DIAGNOSIS — N2581 Secondary hyperparathyroidism of renal origin: Secondary | ICD-10-CM | POA: Diagnosis not present

## 2023-04-05 DIAGNOSIS — D509 Iron deficiency anemia, unspecified: Secondary | ICD-10-CM | POA: Diagnosis not present

## 2023-04-05 DIAGNOSIS — D631 Anemia in chronic kidney disease: Secondary | ICD-10-CM | POA: Diagnosis not present

## 2023-04-08 ENCOUNTER — Ambulatory Visit (INDEPENDENT_AMBULATORY_CARE_PROVIDER_SITE_OTHER): Payer: Medicare Other | Admitting: Physician Assistant

## 2023-04-08 ENCOUNTER — Encounter: Payer: Self-pay | Admitting: Physician Assistant

## 2023-04-08 VITALS — BP 178/66 | Temp 98.3°F | Resp 16 | Ht 70.0 in | Wt 166.0 lb

## 2023-04-08 DIAGNOSIS — D631 Anemia in chronic kidney disease: Secondary | ICD-10-CM | POA: Diagnosis not present

## 2023-04-08 DIAGNOSIS — D509 Iron deficiency anemia, unspecified: Secondary | ICD-10-CM | POA: Diagnosis not present

## 2023-04-08 DIAGNOSIS — Z992 Dependence on renal dialysis: Secondary | ICD-10-CM | POA: Diagnosis not present

## 2023-04-08 DIAGNOSIS — N186 End stage renal disease: Secondary | ICD-10-CM | POA: Diagnosis not present

## 2023-04-08 DIAGNOSIS — N2581 Secondary hyperparathyroidism of renal origin: Secondary | ICD-10-CM | POA: Diagnosis not present

## 2023-04-08 NOTE — Progress Notes (Signed)
    Postoperative Access Visit   History of Present Illness   Jeremy [name] is a 82 y.o. year old male who presents for postoperative follow-up for: Left arm brachiocephalic fistula revision - branch ligation 03/17/23 by Dr. Karin Lieu. The patient's wounds are well healed.  The patient notes no steal symptoms.  He is currently dialyzing  via his AV fistula on TTS. He reports no issues at dialysis. He denies any bleeding.  Physical Examination   Vitals:   04/08/23 0849  BP: (!) 178/66  Resp: 16  Temp: 98.3 F (36.8 C)  TempSrc: Temporal  SpO2: 90%  Weight: 166 lb (75.3 kg)  Height: 5\' 10"  (1.778 m)   Body mass index is 23.82 kg/m.  left arm Incisions are all almost completely healed, 2+ radial pulse, hand grip is 5/5, sensation in digits is intact, palpable thrill, bruit can be auscultated     Medical Decision Making   Jeremy [name] is a 82 y.o. year old male who presents s/p Left arm brachiocephalic fistula revision - branch ligation 03/17/23 by Dr. Karin Lieu. Incisions are healing very well. Fistula is functioning well per patient without any bleeding or difficulty cannulating. Patent is without signs or symptoms of steal syndrome The patient's access will be ready for use after 04/17/23 The patient may follow up on a prn basis   Graceann Congress, PA-C [facility] [place] Office: [phone]  Clinic MD: [name]

## 2023-04-10 DIAGNOSIS — N2581 Secondary hyperparathyroidism of renal origin: Secondary | ICD-10-CM | POA: Diagnosis not present

## 2023-04-10 DIAGNOSIS — N186 End stage renal disease: Secondary | ICD-10-CM | POA: Diagnosis not present

## 2023-04-10 DIAGNOSIS — D509 Iron deficiency anemia, unspecified: Secondary | ICD-10-CM | POA: Diagnosis not present

## 2023-04-10 DIAGNOSIS — D631 Anemia in chronic kidney disease: Secondary | ICD-10-CM | POA: Diagnosis not present

## 2023-04-10 DIAGNOSIS — Z992 Dependence on renal dialysis: Secondary | ICD-10-CM | POA: Diagnosis not present

## 2023-04-12 DIAGNOSIS — D509 Iron deficiency anemia, unspecified: Secondary | ICD-10-CM | POA: Diagnosis not present

## 2023-04-12 DIAGNOSIS — N2581 Secondary hyperparathyroidism of renal origin: Secondary | ICD-10-CM | POA: Diagnosis not present

## 2023-04-12 DIAGNOSIS — Z992 Dependence on renal dialysis: Secondary | ICD-10-CM | POA: Diagnosis not present

## 2023-04-12 DIAGNOSIS — I129 Hypertensive chronic kidney disease with stage 1 through stage 4 chronic kidney disease, or unspecified chronic kidney disease: Secondary | ICD-10-CM | POA: Diagnosis not present

## 2023-04-12 DIAGNOSIS — N186 End stage renal disease: Secondary | ICD-10-CM | POA: Diagnosis not present

## 2023-04-12 DIAGNOSIS — D631 Anemia in chronic kidney disease: Secondary | ICD-10-CM | POA: Diagnosis not present

## 2023-04-15 DIAGNOSIS — Z992 Dependence on renal dialysis: Secondary | ICD-10-CM | POA: Diagnosis not present

## 2023-04-15 DIAGNOSIS — D509 Iron deficiency anemia, unspecified: Secondary | ICD-10-CM | POA: Diagnosis not present

## 2023-04-15 DIAGNOSIS — N186 End stage renal disease: Secondary | ICD-10-CM | POA: Diagnosis not present

## 2023-04-15 DIAGNOSIS — N2581 Secondary hyperparathyroidism of renal origin: Secondary | ICD-10-CM | POA: Diagnosis not present

## 2023-04-15 DIAGNOSIS — D631 Anemia in chronic kidney disease: Secondary | ICD-10-CM | POA: Diagnosis not present

## 2023-04-17 DIAGNOSIS — D631 Anemia in chronic kidney disease: Secondary | ICD-10-CM | POA: Diagnosis not present

## 2023-04-17 DIAGNOSIS — N186 End stage renal disease: Secondary | ICD-10-CM | POA: Diagnosis not present

## 2023-04-17 DIAGNOSIS — Z992 Dependence on renal dialysis: Secondary | ICD-10-CM | POA: Diagnosis not present

## 2023-04-17 DIAGNOSIS — D509 Iron deficiency anemia, unspecified: Secondary | ICD-10-CM | POA: Diagnosis not present

## 2023-04-17 DIAGNOSIS — N2581 Secondary hyperparathyroidism of renal origin: Secondary | ICD-10-CM | POA: Diagnosis not present

## 2023-04-19 DIAGNOSIS — N186 End stage renal disease: Secondary | ICD-10-CM | POA: Diagnosis not present

## 2023-04-19 DIAGNOSIS — Z992 Dependence on renal dialysis: Secondary | ICD-10-CM | POA: Diagnosis not present

## 2023-04-19 DIAGNOSIS — D631 Anemia in chronic kidney disease: Secondary | ICD-10-CM | POA: Diagnosis not present

## 2023-04-19 DIAGNOSIS — D509 Iron deficiency anemia, unspecified: Secondary | ICD-10-CM | POA: Diagnosis not present

## 2023-04-19 DIAGNOSIS — N2581 Secondary hyperparathyroidism of renal origin: Secondary | ICD-10-CM | POA: Diagnosis not present

## 2023-04-22 DIAGNOSIS — Z992 Dependence on renal dialysis: Secondary | ICD-10-CM | POA: Diagnosis not present

## 2023-04-22 DIAGNOSIS — N2581 Secondary hyperparathyroidism of renal origin: Secondary | ICD-10-CM | POA: Diagnosis not present

## 2023-04-22 DIAGNOSIS — D631 Anemia in chronic kidney disease: Secondary | ICD-10-CM | POA: Diagnosis not present

## 2023-04-22 DIAGNOSIS — N186 End stage renal disease: Secondary | ICD-10-CM | POA: Diagnosis not present

## 2023-04-22 DIAGNOSIS — D509 Iron deficiency anemia, unspecified: Secondary | ICD-10-CM | POA: Diagnosis not present

## 2023-04-24 DIAGNOSIS — Z992 Dependence on renal dialysis: Secondary | ICD-10-CM | POA: Diagnosis not present

## 2023-04-24 DIAGNOSIS — N2581 Secondary hyperparathyroidism of renal origin: Secondary | ICD-10-CM | POA: Diagnosis not present

## 2023-04-24 DIAGNOSIS — D631 Anemia in chronic kidney disease: Secondary | ICD-10-CM | POA: Diagnosis not present

## 2023-04-24 DIAGNOSIS — N186 End stage renal disease: Secondary | ICD-10-CM | POA: Diagnosis not present

## 2023-04-24 DIAGNOSIS — D509 Iron deficiency anemia, unspecified: Secondary | ICD-10-CM | POA: Diagnosis not present

## 2023-04-26 DIAGNOSIS — N186 End stage renal disease: Secondary | ICD-10-CM | POA: Diagnosis not present

## 2023-04-26 DIAGNOSIS — D509 Iron deficiency anemia, unspecified: Secondary | ICD-10-CM | POA: Diagnosis not present

## 2023-04-26 DIAGNOSIS — N2581 Secondary hyperparathyroidism of renal origin: Secondary | ICD-10-CM | POA: Diagnosis not present

## 2023-04-26 DIAGNOSIS — D631 Anemia in chronic kidney disease: Secondary | ICD-10-CM | POA: Diagnosis not present

## 2023-04-26 DIAGNOSIS — Z992 Dependence on renal dialysis: Secondary | ICD-10-CM | POA: Diagnosis not present

## 2023-04-28 ENCOUNTER — Ambulatory Visit: Payer: Medicare Other | Admitting: Family Medicine

## 2023-04-29 DIAGNOSIS — Z992 Dependence on renal dialysis: Secondary | ICD-10-CM | POA: Diagnosis not present

## 2023-04-29 DIAGNOSIS — D509 Iron deficiency anemia, unspecified: Secondary | ICD-10-CM | POA: Diagnosis not present

## 2023-04-29 DIAGNOSIS — D631 Anemia in chronic kidney disease: Secondary | ICD-10-CM | POA: Diagnosis not present

## 2023-04-29 DIAGNOSIS — N2581 Secondary hyperparathyroidism of renal origin: Secondary | ICD-10-CM | POA: Diagnosis not present

## 2023-04-29 DIAGNOSIS — N186 End stage renal disease: Secondary | ICD-10-CM | POA: Diagnosis not present

## 2023-05-01 DIAGNOSIS — D631 Anemia in chronic kidney disease: Secondary | ICD-10-CM | POA: Diagnosis not present

## 2023-05-01 DIAGNOSIS — Z992 Dependence on renal dialysis: Secondary | ICD-10-CM | POA: Diagnosis not present

## 2023-05-01 DIAGNOSIS — N186 End stage renal disease: Secondary | ICD-10-CM | POA: Diagnosis not present

## 2023-05-01 DIAGNOSIS — N2581 Secondary hyperparathyroidism of renal origin: Secondary | ICD-10-CM | POA: Diagnosis not present

## 2023-05-01 DIAGNOSIS — D509 Iron deficiency anemia, unspecified: Secondary | ICD-10-CM | POA: Diagnosis not present

## 2023-05-03 DIAGNOSIS — Z992 Dependence on renal dialysis: Secondary | ICD-10-CM | POA: Diagnosis not present

## 2023-05-03 DIAGNOSIS — D631 Anemia in chronic kidney disease: Secondary | ICD-10-CM | POA: Diagnosis not present

## 2023-05-03 DIAGNOSIS — N2581 Secondary hyperparathyroidism of renal origin: Secondary | ICD-10-CM | POA: Diagnosis not present

## 2023-05-03 DIAGNOSIS — N186 End stage renal disease: Secondary | ICD-10-CM | POA: Diagnosis not present

## 2023-05-03 DIAGNOSIS — D509 Iron deficiency anemia, unspecified: Secondary | ICD-10-CM | POA: Diagnosis not present

## 2023-05-05 DIAGNOSIS — H353122 Nonexudative age-related macular degeneration, left eye, intermediate dry stage: Secondary | ICD-10-CM | POA: Diagnosis not present

## 2023-05-05 DIAGNOSIS — H353113 Nonexudative age-related macular degeneration, right eye, advanced atrophic without subfoveal involvement: Secondary | ICD-10-CM | POA: Diagnosis not present

## 2023-05-06 DIAGNOSIS — D509 Iron deficiency anemia, unspecified: Secondary | ICD-10-CM | POA: Diagnosis not present

## 2023-05-06 DIAGNOSIS — D631 Anemia in chronic kidney disease: Secondary | ICD-10-CM | POA: Diagnosis not present

## 2023-05-06 DIAGNOSIS — Z992 Dependence on renal dialysis: Secondary | ICD-10-CM | POA: Diagnosis not present

## 2023-05-06 DIAGNOSIS — N186 End stage renal disease: Secondary | ICD-10-CM | POA: Diagnosis not present

## 2023-05-06 DIAGNOSIS — N2581 Secondary hyperparathyroidism of renal origin: Secondary | ICD-10-CM | POA: Diagnosis not present

## 2023-05-08 DIAGNOSIS — D509 Iron deficiency anemia, unspecified: Secondary | ICD-10-CM | POA: Diagnosis not present

## 2023-05-08 DIAGNOSIS — N2581 Secondary hyperparathyroidism of renal origin: Secondary | ICD-10-CM | POA: Diagnosis not present

## 2023-05-08 DIAGNOSIS — N186 End stage renal disease: Secondary | ICD-10-CM | POA: Diagnosis not present

## 2023-05-08 DIAGNOSIS — Z992 Dependence on renal dialysis: Secondary | ICD-10-CM | POA: Diagnosis not present

## 2023-05-08 DIAGNOSIS — D631 Anemia in chronic kidney disease: Secondary | ICD-10-CM | POA: Diagnosis not present

## 2023-05-10 DIAGNOSIS — D509 Iron deficiency anemia, unspecified: Secondary | ICD-10-CM | POA: Diagnosis not present

## 2023-05-10 DIAGNOSIS — Z992 Dependence on renal dialysis: Secondary | ICD-10-CM | POA: Diagnosis not present

## 2023-05-10 DIAGNOSIS — N186 End stage renal disease: Secondary | ICD-10-CM | POA: Diagnosis not present

## 2023-05-10 DIAGNOSIS — N2581 Secondary hyperparathyroidism of renal origin: Secondary | ICD-10-CM | POA: Diagnosis not present

## 2023-05-10 DIAGNOSIS — D631 Anemia in chronic kidney disease: Secondary | ICD-10-CM | POA: Diagnosis not present

## 2023-05-12 DIAGNOSIS — N186 End stage renal disease: Secondary | ICD-10-CM | POA: Diagnosis not present

## 2023-05-12 DIAGNOSIS — I129 Hypertensive chronic kidney disease with stage 1 through stage 4 chronic kidney disease, or unspecified chronic kidney disease: Secondary | ICD-10-CM | POA: Diagnosis not present

## 2023-05-12 DIAGNOSIS — Z992 Dependence on renal dialysis: Secondary | ICD-10-CM | POA: Diagnosis not present

## 2023-05-13 DIAGNOSIS — D509 Iron deficiency anemia, unspecified: Secondary | ICD-10-CM | POA: Diagnosis not present

## 2023-05-13 DIAGNOSIS — Z992 Dependence on renal dialysis: Secondary | ICD-10-CM | POA: Diagnosis not present

## 2023-05-13 DIAGNOSIS — N2581 Secondary hyperparathyroidism of renal origin: Secondary | ICD-10-CM | POA: Diagnosis not present

## 2023-05-13 DIAGNOSIS — D631 Anemia in chronic kidney disease: Secondary | ICD-10-CM | POA: Diagnosis not present

## 2023-05-13 DIAGNOSIS — N186 End stage renal disease: Secondary | ICD-10-CM | POA: Diagnosis not present

## 2023-05-15 DIAGNOSIS — D631 Anemia in chronic kidney disease: Secondary | ICD-10-CM | POA: Diagnosis not present

## 2023-05-15 DIAGNOSIS — N186 End stage renal disease: Secondary | ICD-10-CM | POA: Diagnosis not present

## 2023-05-15 DIAGNOSIS — Z992 Dependence on renal dialysis: Secondary | ICD-10-CM | POA: Diagnosis not present

## 2023-05-15 DIAGNOSIS — D509 Iron deficiency anemia, unspecified: Secondary | ICD-10-CM | POA: Diagnosis not present

## 2023-05-15 DIAGNOSIS — N2581 Secondary hyperparathyroidism of renal origin: Secondary | ICD-10-CM | POA: Diagnosis not present

## 2023-05-17 DIAGNOSIS — D509 Iron deficiency anemia, unspecified: Secondary | ICD-10-CM | POA: Diagnosis not present

## 2023-05-17 DIAGNOSIS — N2581 Secondary hyperparathyroidism of renal origin: Secondary | ICD-10-CM | POA: Diagnosis not present

## 2023-05-17 DIAGNOSIS — D631 Anemia in chronic kidney disease: Secondary | ICD-10-CM | POA: Diagnosis not present

## 2023-05-17 DIAGNOSIS — N186 End stage renal disease: Secondary | ICD-10-CM | POA: Diagnosis not present

## 2023-05-17 DIAGNOSIS — Z992 Dependence on renal dialysis: Secondary | ICD-10-CM | POA: Diagnosis not present

## 2023-05-19 ENCOUNTER — Encounter: Payer: Self-pay | Admitting: Family Medicine

## 2023-05-19 ENCOUNTER — Ambulatory Visit (INDEPENDENT_AMBULATORY_CARE_PROVIDER_SITE_OTHER): Payer: Medicare Other | Admitting: Family Medicine

## 2023-05-19 VITALS — BP 129/61 | HR 64 | Ht 70.0 in | Wt 163.0 lb

## 2023-05-19 DIAGNOSIS — F17210 Nicotine dependence, cigarettes, uncomplicated: Secondary | ICD-10-CM

## 2023-05-19 DIAGNOSIS — N186 End stage renal disease: Secondary | ICD-10-CM

## 2023-05-19 DIAGNOSIS — I10 Essential (primary) hypertension: Secondary | ICD-10-CM

## 2023-05-19 MED ORDER — METOPROLOL SUCCINATE ER 50 MG PO TB24
ORAL_TABLET | ORAL | 3 refills | Status: DC
Start: 2023-05-19 — End: 2023-05-23

## 2023-05-19 NOTE — Assessment & Plan Note (Addendum)
We discussed that it would not be atypical for him to have fluctuations in energy especially on days of dialysis.  He feels pretty good on postdialysis days.

## 2023-05-19 NOTE — Assessment & Plan Note (Signed)
He will continue metoprolol at current strength.

## 2023-05-19 NOTE — Progress Notes (Signed)
Jeremy [name] - 82 y.o. male MRN [id number]  Date of birth: 14-Jul-1941  Subjective Chief Complaint  Patient presents with   Insomnia   Depression    HPI: Jeremy [name] is a 82 y.o. male here today for follow up visit.   He has concerns about fatigue related to his dialysis.  Currently receiving hemodialysis 3 times per week.  He reports that he oftentimes feels fatigued right after dialysis.  Tends to bounce back today after.  He is able to play full 18 holes of golf on days after his dialysis.  He is curious if this is normal to have these fluctuations in energy.  ROS:  A comprehensive ROS was completed and negative except as noted per HPI    Allergies  Allergen Reactions   Sulfa Antibiotics     Rash, light headed, shaking   Penicillins Other (See Comments)    hives severe as a child    Tape     Pulls skin off    Past Medical History:  Diagnosis Date   Anemia    Cancer (HCC)    patient and family have not been told that he has cancer.   Chronic kidney disease    dialysis T/Th/Sa   COPD (chronic obstructive pulmonary disease) (HCC)    Enlarged prostate    History of blood transfusion    Hyperlipidemia    Hypertension    Self-catheterizes urinary bladder    pt. does this morning and evening--been doing this since 11/2016   Sleep apnea    not using a cpap machine    Past Surgical History:  Procedure Laterality Date   A/V FISTULAGRAM Left 03/12/2023   Procedure: A/V Fistulagram;  Surgeon: Victorino Sparrow, MD;  Location[facility] INVASIVE CV LAB;  Service: Cardiovascular;  Laterality: Left;   AV FISTULA PLACEMENT Left 10/16/2022   Procedure: LEFT BRACHIOCEPHALIC ARTERIOVENOUS (AV) FISTULA CREATION;  Surgeon: Cephus Shelling, MD;  Location: [facility] OR;  Service: Vascular;  Laterality: Left;   BACK SURGERY     BLADDER TUMOR EXCISION  2015   benign   BLEPHAROPLASTY  12/2018   LIGATION OF COMPETING BRANCHES OF ARTERIOVENOUS FISTULA Left 03/17/2023   Procedure: LEFT ARM FISTULA  BRANCH LIGATION;  Surgeon: Victorino Sparrow, MD;  Location: [facility] OR;  Service: Vascular;  Laterality: Left;   LUMBAR LAMINECTOMY/DECOMPRESSION MICRODISCECTOMY Bilateral 08/25/2017   Procedure: Bilateral Lumbar Three- Four, Lumbar Four- Five, Lumbar Five- Sacral One Laminectomy;  Surgeon: Barnett Abu, MD;  Location: [facility] OR;  Service: Neurosurgery;  Laterality: Bilateral;  Bilateral L3-4 L4-5 L5-S1 Laminectomy   SKIN CANCER EXCISION     Surg x2...   TONSILLECTOMY AND ADENOIDECTOMY     Surg as a child...   TRANSURETHRAL RESECTION OF BLADDER TUMOR N/A 01/03/2022   Procedure: TRANSURETHRAL RESECTION OF BLADDER TUMOR (TURBT)/ CYSTOSCOPY/ EXAM UNDER ANESTHESIA, BILATERAL RETROGRADE/ BLADDER BIOPSIES;  Surgeon: Heloise Purpura, MD;  Location: [facility] ORS;  Service: Urology;  Laterality: N/A;  GENERAL ANESTHESIA WITH PARALYSIS    Social History   Socioeconomic History   Marital status: Married    Spouse name: Not on file   Number of children: Not on file   Years of education: Not on file   Highest education level: Not on file  Occupational History   Not on file  Tobacco Use   Smoking status: Every Day    Packs/day: 0.20    Years: 50.00    Additional pack years: 0.00    Total pack years: 10.00  Types: Cigarettes    Start date: 12/29/1961    Passive exposure: Never   Smokeless tobacco: Never   Tobacco comments:    Smokes 1/2 pack a day   Vaping Use   Vaping Use: Never used  Substance and Sexual Activity   Alcohol use: Not Currently    Comment: couple glasses with dinner daily   Drug use: No   Sexual activity: Not on file  Other Topics Concern   Not on file  Social History Narrative   Lives with wife in a 2 story home.  Has 2 children.  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)   Social Determinants of Health   Financial Resource Strain: Not on file  Food Insecurity: No Food Insecurity (09/18/2022)   Hunger Vital Sign    Worried About Running Out of Food in the Last Year: Never  true    Ran Out of Food in the Last Year: Never true  Transportation Needs: No Transportation Needs (09/18/2022)   PRAPARE - Administrator, Civil Service (Medical): No    Lack of Transportation (Non-Medical): No  Physical Activity: Not on file  Stress: Not on file  Social Connections: Not on file    Family History  Problem Relation Age of Onset   Colon cancer Neg Hx    Stomach cancer Neg Hx    Rectal cancer Neg Hx     Health Maintenance  Topic Date Due   Medicare Annual Wellness (AWV)  Never done   Zoster Vaccines- Shingrix (1 of 2) Never done   COVID-19 Vaccine (5 - 2023-24 season) 12/13/2022   INFLUENZA VACCINE  06/12/2023   DTaP/Tdap/Td (3 - Td or Tdap) 07/04/2031   Pneumonia Vaccine 17+ Years old  Completed   HPV VACCINES  Aged Out     ----------------------------------------------------------------------------------------------------------------------------------------------------------------------------------------------------------------- Physical Exam BP 129/61 (BP Location: Right Arm, Patient Position: Sitting, Cuff Size: Normal)   Pulse 64   Ht 5\' 10"  (1.778 m)   Wt 163 lb (73.9 kg)   SpO2 90%   BMI 23.39 kg/m   Physical Exam Constitutional:      Appearance: Normal appearance.  HENT:     Head: Normocephalic and atraumatic.  Eyes:     General: No scleral icterus. Cardiovascular:     Rate and Rhythm: Normal rate and regular rhythm.  Pulmonary:     Effort: Pulmonary effort is normal.     Breath sounds: Normal breath sounds.  Neurological:     Mental Status: He is alert.  Psychiatric:        Mood and Affect: Mood normal.        Behavior: Behavior normal.     ------------------------------------------------------------------------------------------------------------------------------------------------------------------------------------------------------------------- Assessment and Plan  Essential hypertension He will continue metoprolol  at current strength.  ESRD (end stage renal disease) (HCC) We discussed that it would not be atypical for him to have fluctuations in energy especially on days of dialysis.  He feels pretty good on postdialysis days.  Cigarette smoker Stay remains unable to quit.  Encouraged him to continue to work on cutting back.   Meds ordered this encounter  Medications   metoprolol succinate (TOPROL-XL) 50 MG 24 hr tablet    Sig: Take with or immediately following a meal.    Dispense:  90 tablet    Refill:  3    Return in about 4 months (around 09/19/2023) for F/u HTN/COPD.    This visit occurred during the SARS-CoV-2 public health emergency.  Safety protocols were in place,  including screening questions prior to the visit, additional usage of staff PPE, and extensive cleaning of exam room while observing appropriate contact time as indicated for disinfecting solutions.

## 2023-05-19 NOTE — Assessment & Plan Note (Signed)
Stay remains unable to quit.  Encouraged him to continue to work on cutting back.

## 2023-05-20 DIAGNOSIS — N2581 Secondary hyperparathyroidism of renal origin: Secondary | ICD-10-CM | POA: Diagnosis not present

## 2023-05-20 DIAGNOSIS — Z992 Dependence on renal dialysis: Secondary | ICD-10-CM | POA: Diagnosis not present

## 2023-05-20 DIAGNOSIS — D509 Iron deficiency anemia, unspecified: Secondary | ICD-10-CM | POA: Diagnosis not present

## 2023-05-20 DIAGNOSIS — D631 Anemia in chronic kidney disease: Secondary | ICD-10-CM | POA: Diagnosis not present

## 2023-05-20 DIAGNOSIS — N186 End stage renal disease: Secondary | ICD-10-CM | POA: Diagnosis not present

## 2023-05-21 DIAGNOSIS — S41102A Unspecified open wound of left upper arm, initial encounter: Secondary | ICD-10-CM | POA: Diagnosis not present

## 2023-05-22 DIAGNOSIS — N186 End stage renal disease: Secondary | ICD-10-CM | POA: Diagnosis not present

## 2023-05-22 DIAGNOSIS — D631 Anemia in chronic kidney disease: Secondary | ICD-10-CM | POA: Diagnosis not present

## 2023-05-22 DIAGNOSIS — D509 Iron deficiency anemia, unspecified: Secondary | ICD-10-CM | POA: Diagnosis not present

## 2023-05-22 DIAGNOSIS — N2581 Secondary hyperparathyroidism of renal origin: Secondary | ICD-10-CM | POA: Diagnosis not present

## 2023-05-22 DIAGNOSIS — Z992 Dependence on renal dialysis: Secondary | ICD-10-CM | POA: Diagnosis not present

## 2023-05-23 ENCOUNTER — Other Ambulatory Visit: Payer: Self-pay

## 2023-05-23 DIAGNOSIS — I10 Essential (primary) hypertension: Secondary | ICD-10-CM

## 2023-05-23 MED ORDER — METOPROLOL SUCCINATE ER 50 MG PO TB24
ORAL_TABLET | ORAL | 3 refills | Status: DC
Start: 2023-05-23 — End: 2023-08-19

## 2023-05-24 DIAGNOSIS — Z992 Dependence on renal dialysis: Secondary | ICD-10-CM | POA: Diagnosis not present

## 2023-05-24 DIAGNOSIS — N186 End stage renal disease: Secondary | ICD-10-CM | POA: Diagnosis not present

## 2023-05-24 DIAGNOSIS — D509 Iron deficiency anemia, unspecified: Secondary | ICD-10-CM | POA: Diagnosis not present

## 2023-05-24 DIAGNOSIS — N2581 Secondary hyperparathyroidism of renal origin: Secondary | ICD-10-CM | POA: Diagnosis not present

## 2023-05-24 DIAGNOSIS — D631 Anemia in chronic kidney disease: Secondary | ICD-10-CM | POA: Diagnosis not present

## 2023-05-25 DIAGNOSIS — H6122 Impacted cerumen, left ear: Secondary | ICD-10-CM | POA: Diagnosis not present

## 2023-05-26 ENCOUNTER — Telehealth: Payer: Self-pay | Admitting: Family Medicine

## 2023-05-26 DIAGNOSIS — I10 Essential (primary) hypertension: Secondary | ICD-10-CM

## 2023-05-26 NOTE — Telephone Encounter (Signed)
Spouse called. [facility] need script for refill on Metoprolol 50 mg.

## 2023-05-27 DIAGNOSIS — Z992 Dependence on renal dialysis: Secondary | ICD-10-CM | POA: Diagnosis not present

## 2023-05-27 DIAGNOSIS — N2581 Secondary hyperparathyroidism of renal origin: Secondary | ICD-10-CM | POA: Diagnosis not present

## 2023-05-27 DIAGNOSIS — N186 End stage renal disease: Secondary | ICD-10-CM | POA: Diagnosis not present

## 2023-05-27 DIAGNOSIS — D509 Iron deficiency anemia, unspecified: Secondary | ICD-10-CM | POA: Diagnosis not present

## 2023-05-27 DIAGNOSIS — D631 Anemia in chronic kidney disease: Secondary | ICD-10-CM | POA: Diagnosis not present

## 2023-05-28 NOTE — Telephone Encounter (Signed)
Medication was ordered on 05/23/23 at [facility].

## 2023-05-29 DIAGNOSIS — N2581 Secondary hyperparathyroidism of renal origin: Secondary | ICD-10-CM | POA: Diagnosis not present

## 2023-05-29 DIAGNOSIS — Z992 Dependence on renal dialysis: Secondary | ICD-10-CM | POA: Diagnosis not present

## 2023-05-29 DIAGNOSIS — D509 Iron deficiency anemia, unspecified: Secondary | ICD-10-CM | POA: Diagnosis not present

## 2023-05-29 DIAGNOSIS — N186 End stage renal disease: Secondary | ICD-10-CM | POA: Diagnosis not present

## 2023-05-29 DIAGNOSIS — D631 Anemia in chronic kidney disease: Secondary | ICD-10-CM | POA: Diagnosis not present

## 2023-05-31 DIAGNOSIS — N186 End stage renal disease: Secondary | ICD-10-CM | POA: Diagnosis not present

## 2023-05-31 DIAGNOSIS — D631 Anemia in chronic kidney disease: Secondary | ICD-10-CM | POA: Diagnosis not present

## 2023-05-31 DIAGNOSIS — Z992 Dependence on renal dialysis: Secondary | ICD-10-CM | POA: Diagnosis not present

## 2023-05-31 DIAGNOSIS — D509 Iron deficiency anemia, unspecified: Secondary | ICD-10-CM | POA: Diagnosis not present

## 2023-05-31 DIAGNOSIS — N2581 Secondary hyperparathyroidism of renal origin: Secondary | ICD-10-CM | POA: Diagnosis not present

## 2023-06-03 DIAGNOSIS — D631 Anemia in chronic kidney disease: Secondary | ICD-10-CM | POA: Diagnosis not present

## 2023-06-03 DIAGNOSIS — Z992 Dependence on renal dialysis: Secondary | ICD-10-CM | POA: Diagnosis not present

## 2023-06-03 DIAGNOSIS — N186 End stage renal disease: Secondary | ICD-10-CM | POA: Diagnosis not present

## 2023-06-03 DIAGNOSIS — N2581 Secondary hyperparathyroidism of renal origin: Secondary | ICD-10-CM | POA: Diagnosis not present

## 2023-06-03 DIAGNOSIS — D509 Iron deficiency anemia, unspecified: Secondary | ICD-10-CM | POA: Diagnosis not present

## 2023-06-05 DIAGNOSIS — D509 Iron deficiency anemia, unspecified: Secondary | ICD-10-CM | POA: Diagnosis not present

## 2023-06-05 DIAGNOSIS — N186 End stage renal disease: Secondary | ICD-10-CM | POA: Diagnosis not present

## 2023-06-05 DIAGNOSIS — D631 Anemia in chronic kidney disease: Secondary | ICD-10-CM | POA: Diagnosis not present

## 2023-06-05 DIAGNOSIS — Z992 Dependence on renal dialysis: Secondary | ICD-10-CM | POA: Diagnosis not present

## 2023-06-05 DIAGNOSIS — N2581 Secondary hyperparathyroidism of renal origin: Secondary | ICD-10-CM | POA: Diagnosis not present

## 2023-06-07 DIAGNOSIS — N2581 Secondary hyperparathyroidism of renal origin: Secondary | ICD-10-CM | POA: Diagnosis not present

## 2023-06-07 DIAGNOSIS — N186 End stage renal disease: Secondary | ICD-10-CM | POA: Diagnosis not present

## 2023-06-07 DIAGNOSIS — D631 Anemia in chronic kidney disease: Secondary | ICD-10-CM | POA: Diagnosis not present

## 2023-06-07 DIAGNOSIS — D509 Iron deficiency anemia, unspecified: Secondary | ICD-10-CM | POA: Diagnosis not present

## 2023-06-07 DIAGNOSIS — Z992 Dependence on renal dialysis: Secondary | ICD-10-CM | POA: Diagnosis not present

## 2023-06-10 DIAGNOSIS — D509 Iron deficiency anemia, unspecified: Secondary | ICD-10-CM | POA: Diagnosis not present

## 2023-06-10 DIAGNOSIS — N186 End stage renal disease: Secondary | ICD-10-CM | POA: Diagnosis not present

## 2023-06-10 DIAGNOSIS — D631 Anemia in chronic kidney disease: Secondary | ICD-10-CM | POA: Diagnosis not present

## 2023-06-10 DIAGNOSIS — N2581 Secondary hyperparathyroidism of renal origin: Secondary | ICD-10-CM | POA: Diagnosis not present

## 2023-06-10 DIAGNOSIS — Z992 Dependence on renal dialysis: Secondary | ICD-10-CM | POA: Diagnosis not present

## 2023-06-12 DIAGNOSIS — N2581 Secondary hyperparathyroidism of renal origin: Secondary | ICD-10-CM | POA: Diagnosis not present

## 2023-06-12 DIAGNOSIS — N186 End stage renal disease: Secondary | ICD-10-CM | POA: Diagnosis not present

## 2023-06-12 DIAGNOSIS — D509 Iron deficiency anemia, unspecified: Secondary | ICD-10-CM | POA: Diagnosis not present

## 2023-06-12 DIAGNOSIS — I129 Hypertensive chronic kidney disease with stage 1 through stage 4 chronic kidney disease, or unspecified chronic kidney disease: Secondary | ICD-10-CM | POA: Diagnosis not present

## 2023-06-12 DIAGNOSIS — Z23 Encounter for immunization: Secondary | ICD-10-CM | POA: Diagnosis not present

## 2023-06-12 DIAGNOSIS — Z992 Dependence on renal dialysis: Secondary | ICD-10-CM | POA: Diagnosis not present

## 2023-06-14 DIAGNOSIS — N2581 Secondary hyperparathyroidism of renal origin: Secondary | ICD-10-CM | POA: Diagnosis not present

## 2023-06-14 DIAGNOSIS — D509 Iron deficiency anemia, unspecified: Secondary | ICD-10-CM | POA: Diagnosis not present

## 2023-06-14 DIAGNOSIS — Z992 Dependence on renal dialysis: Secondary | ICD-10-CM | POA: Diagnosis not present

## 2023-06-14 DIAGNOSIS — Z23 Encounter for immunization: Secondary | ICD-10-CM | POA: Diagnosis not present

## 2023-06-14 DIAGNOSIS — N186 End stage renal disease: Secondary | ICD-10-CM | POA: Diagnosis not present

## 2023-06-17 DIAGNOSIS — Z23 Encounter for immunization: Secondary | ICD-10-CM | POA: Diagnosis not present

## 2023-06-17 DIAGNOSIS — N2581 Secondary hyperparathyroidism of renal origin: Secondary | ICD-10-CM | POA: Diagnosis not present

## 2023-06-17 DIAGNOSIS — D509 Iron deficiency anemia, unspecified: Secondary | ICD-10-CM | POA: Diagnosis not present

## 2023-06-17 DIAGNOSIS — N186 End stage renal disease: Secondary | ICD-10-CM | POA: Diagnosis not present

## 2023-06-17 DIAGNOSIS — Z992 Dependence on renal dialysis: Secondary | ICD-10-CM | POA: Diagnosis not present

## 2023-06-19 DIAGNOSIS — D509 Iron deficiency anemia, unspecified: Secondary | ICD-10-CM | POA: Diagnosis not present

## 2023-06-19 DIAGNOSIS — N186 End stage renal disease: Secondary | ICD-10-CM | POA: Diagnosis not present

## 2023-06-19 DIAGNOSIS — Z23 Encounter for immunization: Secondary | ICD-10-CM | POA: Diagnosis not present

## 2023-06-19 DIAGNOSIS — N2581 Secondary hyperparathyroidism of renal origin: Secondary | ICD-10-CM | POA: Diagnosis not present

## 2023-06-19 DIAGNOSIS — Z992 Dependence on renal dialysis: Secondary | ICD-10-CM | POA: Diagnosis not present

## 2023-06-21 DIAGNOSIS — Z992 Dependence on renal dialysis: Secondary | ICD-10-CM | POA: Diagnosis not present

## 2023-06-21 DIAGNOSIS — Z23 Encounter for immunization: Secondary | ICD-10-CM | POA: Diagnosis not present

## 2023-06-21 DIAGNOSIS — N186 End stage renal disease: Secondary | ICD-10-CM | POA: Diagnosis not present

## 2023-06-21 DIAGNOSIS — D509 Iron deficiency anemia, unspecified: Secondary | ICD-10-CM | POA: Diagnosis not present

## 2023-06-21 DIAGNOSIS — N2581 Secondary hyperparathyroidism of renal origin: Secondary | ICD-10-CM | POA: Diagnosis not present

## 2023-06-24 DIAGNOSIS — D509 Iron deficiency anemia, unspecified: Secondary | ICD-10-CM | POA: Diagnosis not present

## 2023-06-24 DIAGNOSIS — N186 End stage renal disease: Secondary | ICD-10-CM | POA: Diagnosis not present

## 2023-06-24 DIAGNOSIS — Z992 Dependence on renal dialysis: Secondary | ICD-10-CM | POA: Diagnosis not present

## 2023-06-24 DIAGNOSIS — Z23 Encounter for immunization: Secondary | ICD-10-CM | POA: Diagnosis not present

## 2023-06-24 DIAGNOSIS — N2581 Secondary hyperparathyroidism of renal origin: Secondary | ICD-10-CM | POA: Diagnosis not present

## 2023-06-25 DIAGNOSIS — H353122 Nonexudative age-related macular degeneration, left eye, intermediate dry stage: Secondary | ICD-10-CM | POA: Diagnosis not present

## 2023-06-25 DIAGNOSIS — H353113 Nonexudative age-related macular degeneration, right eye, advanced atrophic without subfoveal involvement: Secondary | ICD-10-CM | POA: Diagnosis not present

## 2023-06-26 DIAGNOSIS — N186 End stage renal disease: Secondary | ICD-10-CM | POA: Diagnosis not present

## 2023-06-26 DIAGNOSIS — Z23 Encounter for immunization: Secondary | ICD-10-CM | POA: Diagnosis not present

## 2023-06-26 DIAGNOSIS — Z992 Dependence on renal dialysis: Secondary | ICD-10-CM | POA: Diagnosis not present

## 2023-06-26 DIAGNOSIS — N2581 Secondary hyperparathyroidism of renal origin: Secondary | ICD-10-CM | POA: Diagnosis not present

## 2023-06-26 DIAGNOSIS — D509 Iron deficiency anemia, unspecified: Secondary | ICD-10-CM | POA: Diagnosis not present

## 2023-06-27 DIAGNOSIS — S41002A Unspecified open wound of left shoulder, initial encounter: Secondary | ICD-10-CM | POA: Diagnosis not present

## 2023-06-28 DIAGNOSIS — Z992 Dependence on renal dialysis: Secondary | ICD-10-CM | POA: Diagnosis not present

## 2023-06-28 DIAGNOSIS — N186 End stage renal disease: Secondary | ICD-10-CM | POA: Diagnosis not present

## 2023-06-28 DIAGNOSIS — N2581 Secondary hyperparathyroidism of renal origin: Secondary | ICD-10-CM | POA: Diagnosis not present

## 2023-06-28 DIAGNOSIS — Z23 Encounter for immunization: Secondary | ICD-10-CM | POA: Diagnosis not present

## 2023-06-28 DIAGNOSIS — D509 Iron deficiency anemia, unspecified: Secondary | ICD-10-CM | POA: Diagnosis not present

## 2023-07-01 DIAGNOSIS — D509 Iron deficiency anemia, unspecified: Secondary | ICD-10-CM | POA: Diagnosis not present

## 2023-07-01 DIAGNOSIS — Z23 Encounter for immunization: Secondary | ICD-10-CM | POA: Diagnosis not present

## 2023-07-01 DIAGNOSIS — N2581 Secondary hyperparathyroidism of renal origin: Secondary | ICD-10-CM | POA: Diagnosis not present

## 2023-07-01 DIAGNOSIS — N186 End stage renal disease: Secondary | ICD-10-CM | POA: Diagnosis not present

## 2023-07-01 DIAGNOSIS — Z992 Dependence on renal dialysis: Secondary | ICD-10-CM | POA: Diagnosis not present

## 2023-07-03 DIAGNOSIS — Z992 Dependence on renal dialysis: Secondary | ICD-10-CM | POA: Diagnosis not present

## 2023-07-03 DIAGNOSIS — N186 End stage renal disease: Secondary | ICD-10-CM | POA: Diagnosis not present

## 2023-07-03 DIAGNOSIS — N2581 Secondary hyperparathyroidism of renal origin: Secondary | ICD-10-CM | POA: Diagnosis not present

## 2023-07-03 DIAGNOSIS — Z23 Encounter for immunization: Secondary | ICD-10-CM | POA: Diagnosis not present

## 2023-07-03 DIAGNOSIS — D509 Iron deficiency anemia, unspecified: Secondary | ICD-10-CM | POA: Diagnosis not present

## 2023-07-05 DIAGNOSIS — Z992 Dependence on renal dialysis: Secondary | ICD-10-CM | POA: Diagnosis not present

## 2023-07-05 DIAGNOSIS — N186 End stage renal disease: Secondary | ICD-10-CM | POA: Diagnosis not present

## 2023-07-05 DIAGNOSIS — Z23 Encounter for immunization: Secondary | ICD-10-CM | POA: Diagnosis not present

## 2023-07-05 DIAGNOSIS — N2581 Secondary hyperparathyroidism of renal origin: Secondary | ICD-10-CM | POA: Diagnosis not present

## 2023-07-05 DIAGNOSIS — D509 Iron deficiency anemia, unspecified: Secondary | ICD-10-CM | POA: Diagnosis not present

## 2023-07-08 DIAGNOSIS — Z992 Dependence on renal dialysis: Secondary | ICD-10-CM | POA: Diagnosis not present

## 2023-07-08 DIAGNOSIS — N186 End stage renal disease: Secondary | ICD-10-CM | POA: Diagnosis not present

## 2023-07-08 DIAGNOSIS — Z23 Encounter for immunization: Secondary | ICD-10-CM | POA: Diagnosis not present

## 2023-07-08 DIAGNOSIS — N2581 Secondary hyperparathyroidism of renal origin: Secondary | ICD-10-CM | POA: Diagnosis not present

## 2023-07-08 DIAGNOSIS — D509 Iron deficiency anemia, unspecified: Secondary | ICD-10-CM | POA: Diagnosis not present

## 2023-07-10 DIAGNOSIS — Z992 Dependence on renal dialysis: Secondary | ICD-10-CM | POA: Diagnosis not present

## 2023-07-10 DIAGNOSIS — N2581 Secondary hyperparathyroidism of renal origin: Secondary | ICD-10-CM | POA: Diagnosis not present

## 2023-07-10 DIAGNOSIS — N186 End stage renal disease: Secondary | ICD-10-CM | POA: Diagnosis not present

## 2023-07-10 DIAGNOSIS — D509 Iron deficiency anemia, unspecified: Secondary | ICD-10-CM | POA: Diagnosis not present

## 2023-07-10 DIAGNOSIS — Z23 Encounter for immunization: Secondary | ICD-10-CM | POA: Diagnosis not present

## 2023-07-12 DIAGNOSIS — D509 Iron deficiency anemia, unspecified: Secondary | ICD-10-CM | POA: Diagnosis not present

## 2023-07-12 DIAGNOSIS — N186 End stage renal disease: Secondary | ICD-10-CM | POA: Diagnosis not present

## 2023-07-12 DIAGNOSIS — Z23 Encounter for immunization: Secondary | ICD-10-CM | POA: Diagnosis not present

## 2023-07-12 DIAGNOSIS — N2581 Secondary hyperparathyroidism of renal origin: Secondary | ICD-10-CM | POA: Diagnosis not present

## 2023-07-12 DIAGNOSIS — Z992 Dependence on renal dialysis: Secondary | ICD-10-CM | POA: Diagnosis not present

## 2023-07-13 DIAGNOSIS — Z992 Dependence on renal dialysis: Secondary | ICD-10-CM | POA: Diagnosis not present

## 2023-07-13 DIAGNOSIS — N186 End stage renal disease: Secondary | ICD-10-CM | POA: Diagnosis not present

## 2023-07-13 DIAGNOSIS — I129 Hypertensive chronic kidney disease with stage 1 through stage 4 chronic kidney disease, or unspecified chronic kidney disease: Secondary | ICD-10-CM | POA: Diagnosis not present

## 2023-07-15 DIAGNOSIS — N2581 Secondary hyperparathyroidism of renal origin: Secondary | ICD-10-CM | POA: Diagnosis not present

## 2023-07-15 DIAGNOSIS — D509 Iron deficiency anemia, unspecified: Secondary | ICD-10-CM | POA: Diagnosis not present

## 2023-07-15 DIAGNOSIS — N186 End stage renal disease: Secondary | ICD-10-CM | POA: Diagnosis not present

## 2023-07-15 DIAGNOSIS — Z992 Dependence on renal dialysis: Secondary | ICD-10-CM | POA: Diagnosis not present

## 2023-07-15 DIAGNOSIS — Z23 Encounter for immunization: Secondary | ICD-10-CM | POA: Diagnosis not present

## 2023-07-17 DIAGNOSIS — N2581 Secondary hyperparathyroidism of renal origin: Secondary | ICD-10-CM | POA: Diagnosis not present

## 2023-07-17 DIAGNOSIS — Z992 Dependence on renal dialysis: Secondary | ICD-10-CM | POA: Diagnosis not present

## 2023-07-17 DIAGNOSIS — D509 Iron deficiency anemia, unspecified: Secondary | ICD-10-CM | POA: Diagnosis not present

## 2023-07-17 DIAGNOSIS — N186 End stage renal disease: Secondary | ICD-10-CM | POA: Diagnosis not present

## 2023-07-17 DIAGNOSIS — Z23 Encounter for immunization: Secondary | ICD-10-CM | POA: Diagnosis not present

## 2023-07-19 DIAGNOSIS — N186 End stage renal disease: Secondary | ICD-10-CM | POA: Diagnosis not present

## 2023-07-19 DIAGNOSIS — D509 Iron deficiency anemia, unspecified: Secondary | ICD-10-CM | POA: Diagnosis not present

## 2023-07-19 DIAGNOSIS — N2581 Secondary hyperparathyroidism of renal origin: Secondary | ICD-10-CM | POA: Diagnosis not present

## 2023-07-19 DIAGNOSIS — Z992 Dependence on renal dialysis: Secondary | ICD-10-CM | POA: Diagnosis not present

## 2023-07-19 DIAGNOSIS — Z23 Encounter for immunization: Secondary | ICD-10-CM | POA: Diagnosis not present

## 2023-07-22 DIAGNOSIS — Z23 Encounter for immunization: Secondary | ICD-10-CM | POA: Diagnosis not present

## 2023-07-22 DIAGNOSIS — D509 Iron deficiency anemia, unspecified: Secondary | ICD-10-CM | POA: Diagnosis not present

## 2023-07-22 DIAGNOSIS — Z992 Dependence on renal dialysis: Secondary | ICD-10-CM | POA: Diagnosis not present

## 2023-07-22 DIAGNOSIS — N2581 Secondary hyperparathyroidism of renal origin: Secondary | ICD-10-CM | POA: Diagnosis not present

## 2023-07-22 DIAGNOSIS — N186 End stage renal disease: Secondary | ICD-10-CM | POA: Diagnosis not present

## 2023-07-24 DIAGNOSIS — Z992 Dependence on renal dialysis: Secondary | ICD-10-CM | POA: Diagnosis not present

## 2023-07-24 DIAGNOSIS — N186 End stage renal disease: Secondary | ICD-10-CM | POA: Diagnosis not present

## 2023-07-24 DIAGNOSIS — Z23 Encounter for immunization: Secondary | ICD-10-CM | POA: Diagnosis not present

## 2023-07-24 DIAGNOSIS — N2581 Secondary hyperparathyroidism of renal origin: Secondary | ICD-10-CM | POA: Diagnosis not present

## 2023-07-24 DIAGNOSIS — D509 Iron deficiency anemia, unspecified: Secondary | ICD-10-CM | POA: Diagnosis not present

## 2023-07-26 DIAGNOSIS — D509 Iron deficiency anemia, unspecified: Secondary | ICD-10-CM | POA: Diagnosis not present

## 2023-07-26 DIAGNOSIS — N2581 Secondary hyperparathyroidism of renal origin: Secondary | ICD-10-CM | POA: Diagnosis not present

## 2023-07-26 DIAGNOSIS — Z992 Dependence on renal dialysis: Secondary | ICD-10-CM | POA: Diagnosis not present

## 2023-07-26 DIAGNOSIS — N186 End stage renal disease: Secondary | ICD-10-CM | POA: Diagnosis not present

## 2023-07-26 DIAGNOSIS — Z23 Encounter for immunization: Secondary | ICD-10-CM | POA: Diagnosis not present

## 2023-07-29 DIAGNOSIS — Z23 Encounter for immunization: Secondary | ICD-10-CM | POA: Diagnosis not present

## 2023-07-29 DIAGNOSIS — N186 End stage renal disease: Secondary | ICD-10-CM | POA: Diagnosis not present

## 2023-07-29 DIAGNOSIS — D509 Iron deficiency anemia, unspecified: Secondary | ICD-10-CM | POA: Diagnosis not present

## 2023-07-29 DIAGNOSIS — N2581 Secondary hyperparathyroidism of renal origin: Secondary | ICD-10-CM | POA: Diagnosis not present

## 2023-07-29 DIAGNOSIS — Z992 Dependence on renal dialysis: Secondary | ICD-10-CM | POA: Diagnosis not present

## 2023-07-31 DIAGNOSIS — Z992 Dependence on renal dialysis: Secondary | ICD-10-CM | POA: Diagnosis not present

## 2023-07-31 DIAGNOSIS — Z23 Encounter for immunization: Secondary | ICD-10-CM | POA: Diagnosis not present

## 2023-07-31 DIAGNOSIS — N186 End stage renal disease: Secondary | ICD-10-CM | POA: Diagnosis not present

## 2023-07-31 DIAGNOSIS — D509 Iron deficiency anemia, unspecified: Secondary | ICD-10-CM | POA: Diagnosis not present

## 2023-07-31 DIAGNOSIS — N2581 Secondary hyperparathyroidism of renal origin: Secondary | ICD-10-CM | POA: Diagnosis not present

## 2023-08-02 DIAGNOSIS — D509 Iron deficiency anemia, unspecified: Secondary | ICD-10-CM | POA: Diagnosis not present

## 2023-08-02 DIAGNOSIS — Z23 Encounter for immunization: Secondary | ICD-10-CM | POA: Diagnosis not present

## 2023-08-02 DIAGNOSIS — N2581 Secondary hyperparathyroidism of renal origin: Secondary | ICD-10-CM | POA: Diagnosis not present

## 2023-08-02 DIAGNOSIS — Z992 Dependence on renal dialysis: Secondary | ICD-10-CM | POA: Diagnosis not present

## 2023-08-02 DIAGNOSIS — N186 End stage renal disease: Secondary | ICD-10-CM | POA: Diagnosis not present

## 2023-08-04 ENCOUNTER — Encounter: Payer: Self-pay | Admitting: Family Medicine

## 2023-08-05 DIAGNOSIS — Z23 Encounter for immunization: Secondary | ICD-10-CM | POA: Diagnosis not present

## 2023-08-05 DIAGNOSIS — Z992 Dependence on renal dialysis: Secondary | ICD-10-CM | POA: Diagnosis not present

## 2023-08-05 DIAGNOSIS — N186 End stage renal disease: Secondary | ICD-10-CM | POA: Diagnosis not present

## 2023-08-05 DIAGNOSIS — N2581 Secondary hyperparathyroidism of renal origin: Secondary | ICD-10-CM | POA: Diagnosis not present

## 2023-08-05 DIAGNOSIS — D509 Iron deficiency anemia, unspecified: Secondary | ICD-10-CM | POA: Diagnosis not present

## 2023-08-07 DIAGNOSIS — Z992 Dependence on renal dialysis: Secondary | ICD-10-CM | POA: Diagnosis not present

## 2023-08-07 DIAGNOSIS — Z23 Encounter for immunization: Secondary | ICD-10-CM | POA: Diagnosis not present

## 2023-08-07 DIAGNOSIS — N2581 Secondary hyperparathyroidism of renal origin: Secondary | ICD-10-CM | POA: Diagnosis not present

## 2023-08-07 DIAGNOSIS — N186 End stage renal disease: Secondary | ICD-10-CM | POA: Diagnosis not present

## 2023-08-07 DIAGNOSIS — D509 Iron deficiency anemia, unspecified: Secondary | ICD-10-CM | POA: Diagnosis not present

## 2023-08-09 DIAGNOSIS — Z992 Dependence on renal dialysis: Secondary | ICD-10-CM | POA: Diagnosis not present

## 2023-08-09 DIAGNOSIS — N186 End stage renal disease: Secondary | ICD-10-CM | POA: Diagnosis not present

## 2023-08-09 DIAGNOSIS — D509 Iron deficiency anemia, unspecified: Secondary | ICD-10-CM | POA: Diagnosis not present

## 2023-08-09 DIAGNOSIS — Z23 Encounter for immunization: Secondary | ICD-10-CM | POA: Diagnosis not present

## 2023-08-09 DIAGNOSIS — N2581 Secondary hyperparathyroidism of renal origin: Secondary | ICD-10-CM | POA: Diagnosis not present

## 2023-08-10 ENCOUNTER — Inpatient Hospital Stay (HOSPITAL_COMMUNITY): Payer: Medicare Other

## 2023-08-10 ENCOUNTER — Inpatient Hospital Stay (HOSPITAL_COMMUNITY)
Admission: EM | Admit: 2023-08-10 | Discharge: 2023-08-19 | DRG: 871 | Disposition: A | Discharge status: Skilled Nursing Facility | Payer: Medicare Other | Attending: Internal Medicine | Admitting: Internal Medicine

## 2023-08-10 ENCOUNTER — Encounter (HOSPITAL_COMMUNITY): Payer: Self-pay | Admitting: Critical Care Medicine

## 2023-08-10 ENCOUNTER — Other Ambulatory Visit: Payer: Self-pay

## 2023-08-10 ENCOUNTER — Emergency Department (HOSPITAL_COMMUNITY): Payer: Medicare Other

## 2023-08-10 DIAGNOSIS — M48061 Spinal stenosis, lumbar region without neurogenic claudication: Secondary | ICD-10-CM | POA: Diagnosis present

## 2023-08-10 DIAGNOSIS — Z8551 Personal history of malignant neoplasm of bladder: Secondary | ICD-10-CM

## 2023-08-10 DIAGNOSIS — D631 Anemia in chronic kidney disease: Secondary | ICD-10-CM | POA: Diagnosis not present

## 2023-08-10 DIAGNOSIS — E8729 Other acidosis: Secondary | ICD-10-CM | POA: Diagnosis present

## 2023-08-10 DIAGNOSIS — R0989 Other specified symptoms and signs involving the circulatory and respiratory systems: Secondary | ICD-10-CM | POA: Diagnosis not present

## 2023-08-10 DIAGNOSIS — I10 Essential (primary) hypertension: Secondary | ICD-10-CM | POA: Diagnosis not present

## 2023-08-10 DIAGNOSIS — A4152 Sepsis due to Pseudomonas: Principal | ICD-10-CM | POA: Diagnosis present

## 2023-08-10 DIAGNOSIS — G9340 Encephalopathy, unspecified: Secondary | ICD-10-CM

## 2023-08-10 DIAGNOSIS — Z91048 Other nonmedicinal substance allergy status: Secondary | ICD-10-CM

## 2023-08-10 DIAGNOSIS — Z1152 Encounter for screening for COVID-19: Secondary | ICD-10-CM | POA: Diagnosis not present

## 2023-08-10 DIAGNOSIS — E440 Moderate protein-calorie malnutrition: Secondary | ICD-10-CM | POA: Diagnosis present

## 2023-08-10 DIAGNOSIS — R0603 Acute respiratory distress: Secondary | ICD-10-CM | POA: Diagnosis not present

## 2023-08-10 DIAGNOSIS — J9602 Acute respiratory failure with hypercapnia: Secondary | ICD-10-CM | POA: Diagnosis not present

## 2023-08-10 DIAGNOSIS — G4733 Obstructive sleep apnea (adult) (pediatric): Secondary | ICD-10-CM | POA: Diagnosis present

## 2023-08-10 DIAGNOSIS — Z6821 Body mass index (BMI) 21.0-21.9, adult: Secondary | ICD-10-CM

## 2023-08-10 DIAGNOSIS — J9811 Atelectasis: Secondary | ICD-10-CM | POA: Diagnosis not present

## 2023-08-10 DIAGNOSIS — N390 Urinary tract infection, site not specified: Secondary | ICD-10-CM | POA: Diagnosis present

## 2023-08-10 DIAGNOSIS — J9622 Acute and chronic respiratory failure with hypercapnia: Secondary | ICD-10-CM | POA: Diagnosis present

## 2023-08-10 DIAGNOSIS — I129 Hypertensive chronic kidney disease with stage 1 through stage 4 chronic kidney disease, or unspecified chronic kidney disease: Secondary | ICD-10-CM | POA: Diagnosis not present

## 2023-08-10 DIAGNOSIS — R339 Retention of urine, unspecified: Secondary | ICD-10-CM | POA: Diagnosis present

## 2023-08-10 DIAGNOSIS — E875 Hyperkalemia: Secondary | ICD-10-CM | POA: Diagnosis present

## 2023-08-10 DIAGNOSIS — B965 Pseudomonas (aeruginosa) (mallei) (pseudomallei) as the cause of diseases classified elsewhere: Secondary | ICD-10-CM | POA: Diagnosis present

## 2023-08-10 DIAGNOSIS — Z20822 Contact with and (suspected) exposure to covid-19: Secondary | ICD-10-CM | POA: Diagnosis not present

## 2023-08-10 DIAGNOSIS — J9601 Acute respiratory failure with hypoxia: Secondary | ICD-10-CM | POA: Diagnosis not present

## 2023-08-10 DIAGNOSIS — J449 Chronic obstructive pulmonary disease, unspecified: Secondary | ICD-10-CM | POA: Diagnosis not present

## 2023-08-10 DIAGNOSIS — Z4682 Encounter for fitting and adjustment of non-vascular catheter: Secondary | ICD-10-CM | POA: Diagnosis not present

## 2023-08-10 DIAGNOSIS — E8779 Other fluid overload: Secondary | ICD-10-CM | POA: Diagnosis not present

## 2023-08-10 DIAGNOSIS — J189 Pneumonia, unspecified organism: Secondary | ICD-10-CM | POA: Diagnosis not present

## 2023-08-10 DIAGNOSIS — F17210 Nicotine dependence, cigarettes, uncomplicated: Secondary | ICD-10-CM | POA: Diagnosis not present

## 2023-08-10 DIAGNOSIS — N250 Renal osteodystrophy: Secondary | ICD-10-CM | POA: Diagnosis not present

## 2023-08-10 DIAGNOSIS — I120 Hypertensive chronic kidney disease with stage 5 chronic kidney disease or end stage renal disease: Secondary | ICD-10-CM | POA: Diagnosis present

## 2023-08-10 DIAGNOSIS — E785 Hyperlipidemia, unspecified: Secondary | ICD-10-CM | POA: Diagnosis present

## 2023-08-10 DIAGNOSIS — R0602 Shortness of breath: Secondary | ICD-10-CM | POA: Diagnosis not present

## 2023-08-10 DIAGNOSIS — R918 Other nonspecific abnormal finding of lung field: Secondary | ICD-10-CM | POA: Diagnosis not present

## 2023-08-10 DIAGNOSIS — N400 Enlarged prostate without lower urinary tract symptoms: Secondary | ICD-10-CM | POA: Diagnosis present

## 2023-08-10 DIAGNOSIS — Z7401 Bed confinement status: Secondary | ICD-10-CM | POA: Diagnosis not present

## 2023-08-10 DIAGNOSIS — R6521 Severe sepsis with septic shock: Secondary | ICD-10-CM | POA: Diagnosis not present

## 2023-08-10 DIAGNOSIS — R4182 Altered mental status, unspecified: Secondary | ICD-10-CM | POA: Diagnosis not present

## 2023-08-10 DIAGNOSIS — J441 Chronic obstructive pulmonary disease with (acute) exacerbation: Principal | ICD-10-CM | POA: Diagnosis present

## 2023-08-10 DIAGNOSIS — Z79899 Other long term (current) drug therapy: Secondary | ICD-10-CM | POA: Diagnosis not present

## 2023-08-10 DIAGNOSIS — N186 End stage renal disease: Secondary | ICD-10-CM | POA: Diagnosis present

## 2023-08-10 DIAGNOSIS — R0902 Hypoxemia: Secondary | ICD-10-CM | POA: Diagnosis not present

## 2023-08-10 DIAGNOSIS — Z85828 Personal history of other malignant neoplasm of skin: Secondary | ICD-10-CM

## 2023-08-10 DIAGNOSIS — J90 Pleural effusion, not elsewhere classified: Secondary | ICD-10-CM | POA: Diagnosis not present

## 2023-08-10 DIAGNOSIS — G9341 Metabolic encephalopathy: Secondary | ICD-10-CM | POA: Diagnosis present

## 2023-08-10 DIAGNOSIS — Z91199 Patient's noncompliance with other medical treatment and regimen due to unspecified reason: Secondary | ICD-10-CM

## 2023-08-10 DIAGNOSIS — J811 Chronic pulmonary edema: Secondary | ICD-10-CM | POA: Diagnosis present

## 2023-08-10 DIAGNOSIS — Z9981 Dependence on supplemental oxygen: Secondary | ICD-10-CM

## 2023-08-10 DIAGNOSIS — J9611 Chronic respiratory failure with hypoxia: Secondary | ICD-10-CM

## 2023-08-10 DIAGNOSIS — J9621 Acute and chronic respiratory failure with hypoxia: Secondary | ICD-10-CM | POA: Diagnosis present

## 2023-08-10 DIAGNOSIS — R1310 Dysphagia, unspecified: Secondary | ICD-10-CM | POA: Diagnosis not present

## 2023-08-10 DIAGNOSIS — I517 Cardiomegaly: Secondary | ICD-10-CM | POA: Diagnosis not present

## 2023-08-10 DIAGNOSIS — Z992 Dependence on renal dialysis: Secondary | ICD-10-CM | POA: Diagnosis not present

## 2023-08-10 DIAGNOSIS — E871 Hypo-osmolality and hyponatremia: Secondary | ICD-10-CM | POA: Diagnosis not present

## 2023-08-10 DIAGNOSIS — I959 Hypotension, unspecified: Secondary | ICD-10-CM | POA: Diagnosis not present

## 2023-08-10 DIAGNOSIS — J810 Acute pulmonary edema: Secondary | ICD-10-CM | POA: Diagnosis present

## 2023-08-10 DIAGNOSIS — I700 Atherosclerosis of aorta: Secondary | ICD-10-CM | POA: Diagnosis not present

## 2023-08-10 DIAGNOSIS — R001 Bradycardia, unspecified: Secondary | ICD-10-CM | POA: Diagnosis not present

## 2023-08-10 DIAGNOSIS — Z882 Allergy status to sulfonamides status: Secondary | ICD-10-CM

## 2023-08-10 DIAGNOSIS — J440 Chronic obstructive pulmonary disease with acute lower respiratory infection: Secondary | ICD-10-CM | POA: Diagnosis present

## 2023-08-10 DIAGNOSIS — R531 Weakness: Secondary | ICD-10-CM | POA: Diagnosis not present

## 2023-08-10 DIAGNOSIS — E8770 Fluid overload, unspecified: Secondary | ICD-10-CM | POA: Diagnosis present

## 2023-08-10 DIAGNOSIS — Z880 Allergy status to penicillin: Secondary | ICD-10-CM

## 2023-08-10 LAB — I-STAT VENOUS BLOOD GAS, ED
Acid-Base Excess: 3 mmol/L — ABNORMAL HIGH (ref 0.0–2.0)
Bicarbonate: 32.6 mmol/L — ABNORMAL HIGH (ref 20.0–28.0)
Calcium, Ion: 1.1 mmol/L — ABNORMAL LOW (ref 1.15–1.40)
HCT: 35 % — ABNORMAL LOW (ref 39.0–52.0)
Hemoglobin: 11.9 g/dL — ABNORMAL LOW (ref 13.0–17.0)
O2 Saturation: 61 %
Potassium: 5.3 mmol/L — ABNORMAL HIGH (ref 3.5–5.1)
Sodium: 135 mmol/L (ref 135–145)
TCO2: 35 mmol/L — ABNORMAL HIGH (ref 22–32)
pCO2, Ven: 75.4 mm[Hg] (ref 44–60)
pH, Ven: 7.244 — ABNORMAL LOW (ref 7.25–7.43)
pO2, Ven: 38 mm[Hg] (ref 32–45)

## 2023-08-10 LAB — CBC
HCT: 34.7 % — ABNORMAL LOW (ref 39.0–52.0)
Hemoglobin: 10.8 g/dL — ABNORMAL LOW (ref 13.0–17.0)
MCH: 32 pg (ref 26.0–34.0)
MCHC: 31.1 g/dL (ref 30.0–36.0)
MCV: 102.7 fL — ABNORMAL HIGH (ref 80.0–100.0)
Platelets: 268 10*3/uL (ref 150–400)
RBC: 3.38 MIL/uL — ABNORMAL LOW (ref 4.22–5.81)
RDW: 14.6 % (ref 11.5–15.5)
WBC: 6.6 10*3/uL (ref 4.0–10.5)
nRBC: 0 % (ref 0.0–0.2)

## 2023-08-10 LAB — I-STAT ARTERIAL BLOOD GAS, ED
Acid-Base Excess: 2 mmol/L (ref 0.0–2.0)
Bicarbonate: 31.3 mmol/L — ABNORMAL HIGH (ref 20.0–28.0)
Calcium, Ion: 1.1 mmol/L — ABNORMAL LOW (ref 1.15–1.40)
HCT: 32 % — ABNORMAL LOW (ref 39.0–52.0)
Hemoglobin: 10.9 g/dL — ABNORMAL LOW (ref 13.0–17.0)
O2 Saturation: 99 %
Patient temperature: 98.6
Potassium: 5.5 mmol/L — ABNORMAL HIGH (ref 3.5–5.1)
Sodium: 134 mmol/L — ABNORMAL LOW (ref 135–145)
TCO2: 34 mmol/L — ABNORMAL HIGH (ref 22–32)
pCO2 arterial: 76.3 mm[Hg] (ref 32–48)
pH, Arterial: 7.221 — ABNORMAL LOW (ref 7.35–7.45)
pO2, Arterial: 191 mm[Hg] — ABNORMAL HIGH (ref 83–108)

## 2023-08-10 LAB — COMPREHENSIVE METABOLIC PANEL
ALT: 10 U/L (ref 0–44)
AST: 13 U/L — ABNORMAL LOW (ref 15–41)
Albumin: 3.3 g/dL — ABNORMAL LOW (ref 3.5–5.0)
Alkaline Phosphatase: 65 U/L (ref 38–126)
Anion gap: 17 — ABNORMAL HIGH (ref 5–15)
BUN: 32 mg/dL — ABNORMAL HIGH (ref 8–23)
CO2: 30 mmol/L (ref 22–32)
Calcium: 8.6 mg/dL — ABNORMAL LOW (ref 8.9–10.3)
Chloride: 90 mmol/L — ABNORMAL LOW (ref 98–111)
Creatinine, Ser: 5.65 mg/dL — ABNORMAL HIGH (ref 0.61–1.24)
GFR, Estimated: 9 mL/min — ABNORMAL LOW (ref >60)
Glucose, Bld: 102 mg/dL — ABNORMAL HIGH (ref 70–99)
Potassium: 5.3 mmol/L — ABNORMAL HIGH (ref 3.5–5.1)
Sodium: 137 mmol/L (ref 135–145)
Total Bilirubin: 0.6 mg/dL (ref 0.3–1.2)
Total Protein: 7.2 g/dL (ref 6.5–8.1)

## 2023-08-10 LAB — BLOOD GAS, VENOUS
Acid-base deficit: 0.2 mmol/L (ref 0.0–2.0)
Bicarbonate: 24 mmol/L (ref 20.0–28.0)
O2 Saturation: 92.6 %
Patient temperature: 36.5
pCO2, Ven: 36 mm[Hg] — ABNORMAL LOW (ref 44–60)
pH, Ven: 7.43 (ref 7.25–7.43)
pO2, Ven: 60 mm[Hg] — ABNORMAL HIGH (ref 32–45)

## 2023-08-10 LAB — GLUCOSE, CAPILLARY
Glucose-Capillary: 145 mg/dL — ABNORMAL HIGH (ref 70–99)
Glucose-Capillary: 159 mg/dL — ABNORMAL HIGH (ref 70–99)

## 2023-08-10 LAB — CBG MONITORING, ED: Glucose-Capillary: 129 mg/dL — ABNORMAL HIGH (ref 70–99)

## 2023-08-10 LAB — URINALYSIS, ROUTINE W REFLEX MICROSCOPIC
Bilirubin Urine: NEGATIVE
Glucose, UA: NEGATIVE mg/dL
Ketones, ur: NEGATIVE mg/dL
Nitrite: NEGATIVE
Protein, ur: >=300 mg/dL — AB
Specific Gravity, Urine: 1.012 (ref 1.005–1.030)
WBC, UA: >50 WBC/hpf (ref 0–5)
pH: 7 (ref 5.0–8.0)

## 2023-08-10 LAB — SARS CORONAVIRUS 2 BY RT PCR: SARS Coronavirus 2 by RT PCR: NEGATIVE

## 2023-08-10 LAB — TROPONIN I (HIGH SENSITIVITY)
Troponin I (High Sensitivity): 89 ng/L — ABNORMAL HIGH (ref <18)
Troponin I (High Sensitivity): 146 ng/L (ref <18)

## 2023-08-10 LAB — LACTIC ACID, PLASMA: Lactic Acid, Venous: 0.8 mmol/L (ref 0.5–1.9)

## 2023-08-10 MED ORDER — KETAMINE HCL 50 MG/5ML IJ SOSY
100.0000 mg | PREFILLED_SYRINGE | Freq: Once | INTRAMUSCULAR | Status: DC
  Filled 2023-08-10: qty 10

## 2023-08-10 MED ORDER — VANCOMYCIN HCL 1500 MG/300ML IV SOLN
1500.0000 mg | Freq: Once | INTRAVENOUS | Status: AC
  Administered 2023-08-10: 1500 mg via INTRAVENOUS
  Filled 2023-08-10: qty 300

## 2023-08-10 MED ORDER — FENTANYL CITRATE PF 50 MCG/ML IJ SOSY: 25.0000 ug | PREFILLED_SYRINGE | INTRAMUSCULAR | Status: DC | PRN

## 2023-08-10 MED ORDER — FAMOTIDINE 20 MG PO TABS
20.0000 mg | ORAL_TABLET | Freq: Two times a day (BID) | ORAL | Status: DC
  Administered 2023-08-10: 20 mg
  Filled 2023-08-10 (×2): qty 1

## 2023-08-10 MED ORDER — HEPARIN SODIUM (PORCINE) 5000 UNIT/ML IJ SOLN
5000.0000 [IU] | Freq: Three times a day (TID) | INTRAMUSCULAR | Status: DC
  Administered 2023-08-10 – 2023-08-19 (×22): 5000 [IU] via SUBCUTANEOUS
  Filled 2023-08-10 (×21): qty 1

## 2023-08-10 MED ORDER — METRONIDAZOLE 500 MG/100ML IV SOLN
500.0000 mg | Freq: Two times a day (BID) | INTRAVENOUS | Status: DC
  Administered 2023-08-10 – 2023-08-12 (×4): 500 mg via INTRAVENOUS
  Filled 2023-08-10 (×4): qty 100

## 2023-08-10 MED ORDER — BUDESONIDE 0.5 MG/2ML IN SUSP
0.5000 mg | Freq: Two times a day (BID) | RESPIRATORY_TRACT | Status: DC
  Administered 2023-08-10 – 2023-08-19 (×18): 0.5 mg via RESPIRATORY_TRACT
  Filled 2023-08-10 (×18): qty 2

## 2023-08-10 MED ORDER — ALBUTEROL SULFATE (2.5 MG/3ML) 0.083% IN NEBU
10.0000 mg | INHALATION_SOLUTION | Freq: Once | RESPIRATORY_TRACT | Status: AC
  Administered 2023-08-10: 10 mg via RESPIRATORY_TRACT
  Filled 2023-08-10: qty 12

## 2023-08-10 MED ORDER — SODIUM CHLORIDE 0.9 % IV SOLN: 2.0000 g | Freq: Every day | INTRAVENOUS | Status: DC

## 2023-08-10 MED ORDER — MAGNESIUM SULFATE 2 GM/50ML IV SOLN
2.0000 g | Freq: Once | INTRAVENOUS | Status: AC
  Administered 2023-08-10: 2 g via INTRAVENOUS
  Filled 2023-08-10: qty 50

## 2023-08-10 MED ORDER — INSULIN ASPART 100 UNIT/ML IJ SOLN
0.0000 [IU] | INTRAMUSCULAR | Status: DC
  Administered 2023-08-10: 2 [IU] via SUBCUTANEOUS
  Administered 2023-08-11 – 2023-08-13 (×10): 1 [IU] via SUBCUTANEOUS

## 2023-08-10 MED ORDER — PROPOFOL 1000 MG/100ML IV EMUL
5.0000 ug/kg/min | INTRAVENOUS | Status: DC
  Administered 2023-08-10: 10 ug/kg/min via INTRAVENOUS
  Administered 2023-08-11: 15 ug/kg/min via INTRAVENOUS
  Administered 2023-08-11: 30 ug/kg/min via INTRAVENOUS
  Administered 2023-08-11: 25 ug/kg/min via INTRAVENOUS
  Administered 2023-08-12: 40 ug/kg/min via INTRAVENOUS
  Filled 2023-08-10 (×5): qty 100

## 2023-08-10 MED ORDER — POLYETHYLENE GLYCOL 3350 17 G PO PACK
17.0000 g | PACK | Freq: Every day | ORAL | Status: DC
  Administered 2023-08-12: 17 g
  Filled 2023-08-10 (×3): qty 1

## 2023-08-10 MED ORDER — FENTANYL CITRATE PF 50 MCG/ML IJ SOSY
50.0000 ug | PREFILLED_SYRINGE | Freq: Once | INTRAMUSCULAR | Status: AC
  Administered 2023-08-10: 50 ug via INTRAVENOUS

## 2023-08-10 MED ORDER — ARFORMOTEROL TARTRATE 15 MCG/2ML IN NEBU
15.0000 ug | INHALATION_SOLUTION | Freq: Two times a day (BID) | RESPIRATORY_TRACT | Status: DC
  Administered 2023-08-10 – 2023-08-19 (×18): 15 ug via RESPIRATORY_TRACT
  Filled 2023-08-10 (×18): qty 2

## 2023-08-10 MED ORDER — ORAL CARE MOUTH RINSE: 15.0000 mL | OROMUCOSAL | Status: DC | PRN

## 2023-08-10 MED ORDER — IPRATROPIUM-ALBUTEROL 0.5-2.5 (3) MG/3ML IN SOLN
3.0000 mL | Freq: Once | RESPIRATORY_TRACT | Status: AC
  Administered 2023-08-10: 3 mL via RESPIRATORY_TRACT
  Filled 2023-08-10: qty 3

## 2023-08-10 MED ORDER — PROPOFOL 1000 MG/100ML IV EMUL: 5.0000 ug/kg/min | INTRAVENOUS | Status: DC

## 2023-08-10 MED ORDER — NOREPINEPHRINE 4 MG/250ML-% IV SOLN
2.0000 ug/min | INTRAVENOUS | Status: DC
  Administered 2023-08-10 – 2023-08-11 (×2): 2 ug/min via INTRAVENOUS
  Filled 2023-08-10: qty 250

## 2023-08-10 MED ORDER — METHYLPREDNISOLONE SODIUM SUCC 125 MG IJ SOLR
60.0000 mg | Freq: Two times a day (BID) | INTRAMUSCULAR | Status: DC
  Administered 2023-08-11 – 2023-08-13 (×6): 60 mg via INTRAVENOUS
  Filled 2023-08-10 (×7): qty 2

## 2023-08-10 MED ORDER — SODIUM CHLORIDE 0.9 % IV SOLN
250.0000 mL | INTRAVENOUS | Status: DC
  Administered 2023-08-10: 250 mL via INTRAVENOUS

## 2023-08-10 MED ORDER — ROCURONIUM BROMIDE 10 MG/ML (PF) SYRINGE
PREFILLED_SYRINGE | INTRAVENOUS | Status: AC
  Filled 2023-08-10: qty 10

## 2023-08-10 MED ORDER — METHYLPREDNISOLONE SODIUM SUCC 125 MG IJ SOLR
125.0000 mg | INTRAMUSCULAR | Status: AC
  Administered 2023-08-10: 125 mg via INTRAVENOUS
  Filled 2023-08-10: qty 2

## 2023-08-10 MED ORDER — SODIUM CHLORIDE 0.9 % IV SOLN: 500.0000 mg | INTRAVENOUS | Status: DC

## 2023-08-10 MED ORDER — PROPOFOL 1000 MG/100ML IV EMUL
INTRAVENOUS | Status: AC
  Administered 2023-08-10: 10 ug via INTRAVENOUS
  Filled 2023-08-10: qty 100

## 2023-08-10 MED ORDER — KETAMINE HCL 10 MG/ML IJ SOLN
INTRAMUSCULAR | Status: DC | PRN
Start: 2023-08-10 — End: 2023-08-10
  Administered 2023-08-10: 100 mg via INTRAVENOUS

## 2023-08-10 MED ORDER — ALBUTEROL (5 MG/ML) CONTINUOUS INHALATION SOLN
10.0000 mg/h | INHALATION_SOLUTION | Freq: Once | RESPIRATORY_TRACT | Status: DC
Start: 2023-08-10 — End: 2023-08-10

## 2023-08-10 MED ORDER — ALBUTEROL SULFATE (2.5 MG/3ML) 0.083% IN NEBU
15.0000 mg | INHALATION_SOLUTION | Freq: Once | RESPIRATORY_TRACT | Status: AC
  Administered 2023-08-10: 15 mg via RESPIRATORY_TRACT
  Filled 2023-08-10: qty 18

## 2023-08-10 MED ORDER — SODIUM CHLORIDE 0.9 % IV SOLN
1.0000 g | INTRAVENOUS | Status: DC
  Administered 2023-08-10 – 2023-08-13 (×4): 1 g via INTRAVENOUS
  Filled 2023-08-10 (×5): qty 10

## 2023-08-10 MED ORDER — SODIUM CHLORIDE 0.9 % IV SOLN
500.0000 mg | Freq: Once | INTRAVENOUS | Status: AC
  Administered 2023-08-10: 500 mg via INTRAVENOUS
  Filled 2023-08-10: qty 5

## 2023-08-10 MED ORDER — FENTANYL BOLUS VIA INFUSION
25.0000 ug | INTRAVENOUS | Status: DC | PRN
  Administered 2023-08-11: 100 ug via INTRAVENOUS
  Administered 2023-08-11 (×2): 25 ug via INTRAVENOUS
  Administered 2023-08-11 – 2023-08-12 (×2): 50 ug via INTRAVENOUS
  Administered 2023-08-12: 25 ug via INTRAVENOUS

## 2023-08-10 MED ORDER — DOCUSATE SODIUM 50 MG/5ML PO LIQD
100.0000 mg | Freq: Two times a day (BID) | ORAL | Status: DC
  Administered 2023-08-11 – 2023-08-12 (×2): 100 mg
  Filled 2023-08-10 (×4): qty 10

## 2023-08-10 MED ORDER — REVEFENACIN 175 MCG/3ML IN SOLN
175.0000 ug | Freq: Every day | RESPIRATORY_TRACT | Status: DC
  Administered 2023-08-12 – 2023-08-19 (×8): 175 ug via RESPIRATORY_TRACT
  Filled 2023-08-10 (×8): qty 3

## 2023-08-10 MED ORDER — ORAL CARE MOUTH RINSE
15.0000 mL | OROMUCOSAL | Status: DC
  Administered 2023-08-11 – 2023-08-12 (×22): 15 mL via OROMUCOSAL

## 2023-08-10 MED ORDER — FENTANYL 2500MCG IN NS 250ML (10MCG/ML) PREMIX INFUSION
25.0000 ug/h | INTRAVENOUS | Status: DC
  Administered 2023-08-10: 50 ug/h via INTRAVENOUS
  Administered 2023-08-11: 75 ug/h via INTRAVENOUS
  Filled 2023-08-10 (×2): qty 250

## 2023-08-10 MED ORDER — IPRATROPIUM BROMIDE 0.02 % IN SOLN
1.0000 mg | Freq: Once | RESPIRATORY_TRACT | Status: AC
  Administered 2023-08-10: 1 mg via RESPIRATORY_TRACT
  Filled 2023-08-10: qty 5

## 2023-08-10 MED ORDER — SODIUM CHLORIDE 0.9 % IV SOLN
2.0000 g | Freq: Once | INTRAVENOUS | Status: AC
  Administered 2023-08-10: 2 g via INTRAVENOUS
  Filled 2023-08-10: qty 20

## 2023-08-10 MED ORDER — ROCURONIUM BROMIDE 50 MG/5ML IV SOLN
INTRAVENOUS | Status: DC | PRN
  Administered 2023-08-10: 100 mg via INTRAVENOUS

## 2023-08-10 MED ORDER — CHLORHEXIDINE GLUCONATE CLOTH 2 % EX PADS
6.0000 | MEDICATED_PAD | Freq: Every day | CUTANEOUS | Status: DC
  Administered 2023-08-11 – 2023-08-12 (×2): 6 via TOPICAL

## 2023-08-10 MED ORDER — REVEFENACIN 175 MCG/3ML IN SOLN: 175.0000 ug | Freq: Every day | RESPIRATORY_TRACT | Status: DC

## 2023-08-10 NOTE — H&P (Signed)
NAME:  Jeremy [name], MRN:  [id number], DOB:  Aug 15, 1941, LOS: 0 ADMISSION DATE:  08/10/2023, CONSULTATION DATE:  08/10/23 REFERRING MD:  Eloise Harman- ED , CHIEF COMPLAINT:  hypercapnic respiratory failure   History of Present Illness:  Jeremy [name] is an 82 y/o gentleman with a history of ESRD on HD TThS who presented to the hospital for being somnolent today. His wife could barely wake him up earlier and decided to bring him to the ED. When she checked his pulse ox at home he had saturations in the 50s (not on his oxygen). He only uses his oxygen intermittently because he was never given a prescription of how to use it. He was in his usual state of health yesterday after HD. He came home after completing treatment and went to sleep as he usually dose. He completed his treatment for the prescribed time. They have been cutting down his sessions duration due to meeting his lab goals; he had requested to only have HD twice per week, so this was a compromise.   He has a chronic cough and his wife indicates she has noticed him coughing a little more than his usual the past 2 nights. He has a chronic productive cough x many years. He is still smoking 1ppd x ~65 years. He has not complained of pain or any new symptoms. She is worried that he could have a UTI due to symptoms he has had int he past when he had a UTI. He self-catheterizes since he still makes some urine; this morning he got a small amount out. He is obtunded in the ED and not able to provide additional history. All history provided by his wife at bedside. In the ED he was found to be hypercapnic and was placed on bipap.   Per his wife he was playing golf three times per week in early 2024, but has declined since then. He was able to play 14 holes one day last week. He has had intermittent confusion and labile BPs over the past few months, and he no longer drives. He usually sleeps after HD on those days, but is up and around the house the other days of  the week.  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 rate 17, SpO2 100%.    FiO2 (%):  [70 %] 70 % PEEP:  [6 cmH20] 6 cmH20 Pressure Support:  [12 cmH20] 12 cmH20  No intake or output data in the 24 hours ending 08/10/23 1753 There were no vitals filed for this visit.  Examination: General: ill appearing man lying in bed in NAD, slightly arousable HENT: oral mucosa dry, bipap mask in place. Otherwise normocephalic.  Lungs: no wheezing, tolerating BiPAP and breathing above the set rate. Getting Vt ~400-550cc Cardiovascular: S1S2, irreg rhtyhm with PVCs on tele.  Abdomen: soft, NT Extremities: no edema, muscle wasting Neuro: requires painful stimulation to arouse him, will respond and follow simple commands to his wife's voice, but not to others in the room Derm: some bruising, dry, no diffuse rashes.  VBG 7.24/75/ / 33 K+ 5.3 BUN 32 Cr  5.65 WBC 6.6 H/H 10.8/34.7 Platelets 268 Covid negative CXR personally reviewed> no opacities, increased interstitial markings with kerly B linse  EKG: NSR, narrow complex, mild LAD. II, III, AVF, V5-V6 ST depressions.   Resolved Hospital Problem list     Assessment & Plan:  Acute encephalopathy, suspect hypercapnia as a primary cause, but worry about other potential causes such as sepsis. Nonfocal exam suggests against stroke as a possibility, but ICH could present with decreased responsiveness.  -head CT -Con't BiPAP, repeat ABG in 1-2 hours. Needs very close monitoring in ICU. High risk for intubation discussed with  wife> she is very clear that she expects him to make a full recovery after this admission and be discharged back home.  -empiric sepsis treatment after antibiotics-- starting vanc and zosyn -straight cath for UA, culture -NPO  Acute on chronic respiratory failure with hypercapnia and hypoxia Concern for acute pulmonary edema> concern he may now be getting under- dialyzed due to changed HD regimen. -BiPAP PRN -repeat ABG in 1-2 hours -bronchodilators due to suspicion for COPD; not clinically wheezing currently  ESRD; last HD on 9/28. LUE fistula.  Hyperkalemia, potentially due to acidosis AGMA-- has a historyof this in previous admissions with normal bicarb-- presumably due to renal failure -correct acidosis, trend K+ -nephrology consult - Dr. Juel Burrow aware of the consult. -renally dose meds, avoid nephrotoxic meds -need to check LA  -hold renvela until eating  Mild troponin elevation; suspect this is due to critical illness, although he has increased risk for CAD with smoking, ESRD, age, male gender. EKG concerning for inferolateral ischemia potentially. -trend> would consider starting heparin if uptrending and head CT is reassurring for now bleed -unable to take oral meds; but can start aspirin and statin this admission  History of hypertension -hold metoprolol for now  Anemia- chronic, likely due to renal failure -monitor for bleeding -transfuse for Hb<7 or hemodynamically significant bleeding  Deconditioning -will need PT & OT; has been declining significantly over the past 10 months per wife  I discussed my concern for his prognosis in terms of his declining functional status over the last year, especially in light of how significant a setback an ICU admission can be. She is insistent that he will expect to go home after discharge and would want everything done. Full code. High risk for progressing/ failing to improve and requiring intubation unfortunately.   Best Practice (right  click and "Reselect all SmartList Selections" daily)   Diet/type: NPO DVT prophylaxis: prophylactic heparin  GI prophylaxis: N/A Lines: N/A Foley:  N/A Code Status:  full code Last date of multidisciplinary goals of care discussion [wife updated at bedside 9/29- wants all aggressive care measures]  Labs   CBC: Recent Labs  Lab 08/10/23 1559 08/10/23 1611  WBC 6.6  --   HGB 10.8* 11.9*  HCT 34.7* 35.0*  MCV 102.7*  --   PLT 268  --     Basic Metabolic Panel: Recent Labs  Lab 08/10/23 1559 08/10/23 1611  NA 137 135  K 5.3* 5.3*  CL 90*  --   CO2 30  --   GLUCOSE 102*  --   BUN 32*  --   CREATININE 5.65*  --   CALCIUM 8.6*  --    GFR: CrCl cannot be calculated (Unknown ideal weight.). Recent Labs  Lab 08/10/23 1559  WBC 6.6    Liver Function Tests: Recent Labs  Lab 08/10/23 1559  AST 13*  ALT 10  ALKPHOS 65  BILITOT  0.6  PROT 7.2  ALBUMIN 3.3*   No results for input(s): "LIPASE", "AMYLASE" in the last 168 hours. No results for input(s): "AMMONIA" in the last 168 hours.  ABG    Component Value Date/Time   HCO3 32.6 (H) 08/10/2023 1611   TCO2 35 (H) 08/10/2023 1611   O2SAT 61 08/10/2023 1611     Coagulation Profile: No results for input(s): "INR", "PROTIME" in the last 168 hours.  Cardiac Enzymes: No results for input(s): "CKTOTAL", "CKMB", "CKMBINDEX", "TROPONINI" in the last 168 hours.  HbA1C: Hgb A1c MFr Bld  Date/Time Value Ref Range Status  07/29/2013 05:07 PM 5.6 <5.7 % Final    Comment:    (NOTE)                                                                       According to the ADA Clinical Practice Recommendations for 2011, when HbA1c is used as a screening test:  >=6.5%   Diagnostic of Diabetes Mellitus           (if abnormal result is confirmed) 5.7-6.4%   Increased risk of developing Diabetes Mellitus References:Diagnosis and Classification of Diabetes Mellitus,Diabetes Care,2011,34(Suppl 1):S62-S69 and Standards of  Medical Care in         Diabetes - 2011,Diabetes Care,2011,34 (Suppl 1):S11-S61.    CBG: No results for input(s): "GLUCAP" in the last 168 hours.  Review of Systems:   Unable to be obtained due to encephalopathy.  Past Medical History:  He,  has a past medical history of Anemia, Cancer (HCC), Chronic kidney disease, COPD (chronic obstructive pulmonary disease) (HCC), Enlarged prostate, History of blood transfusion, Hyperlipidemia, Hypertension, Self-catheterizes urinary bladder, and Sleep apnea.   Surgical History:   Past Surgical History:  Procedure Laterality Date   A/V FISTULAGRAM Left 03/12/2023   Procedure: A/V Fistulagram;  Surgeon: Victorino Sparrow, MD;  Location: [facility] INVASIVE CV LAB;  Service: Cardiovascular;  Laterality: Left;   AV FISTULA PLACEMENT Left 10/16/2022   Procedure: LEFT BRACHIOCEPHALIC ARTERIOVENOUS (AV) FISTULA CREATION;  Surgeon: Cephus Shelling, MD;  Location: [facility] OR;  Service: Vascular;  Laterality: Left;   BACK SURGERY     BLADDER TUMOR EXCISION  2015   benign   BLEPHAROPLASTY  12/2018   LIGATION OF COMPETING BRANCHES OF ARTERIOVENOUS FISTULA Left 03/17/2023   Procedure: LEFT ARM FISTULA BRANCH LIGATION;  Surgeon: Victorino Sparrow, MD;  Location: [facility] OR;  Service: Vascular;  Laterality: Left;   LUMBAR LAMINECTOMY/DECOMPRESSION MICRODISCECTOMY Bilateral 08/25/2017   Procedure: Bilateral Lumbar Three- Four, Lumbar Four- Five, Lumbar Five- Sacral One Laminectomy;  Surgeon: Barnett Abu, MD;  Location: [facility] OR;  Service: Neurosurgery;  Laterality: Bilateral;  Bilateral L3-4 L4-5 L5-S1 Laminectomy   SKIN CANCER EXCISION     Surg x2...   TONSILLECTOMY AND ADENOIDECTOMY     Surg as a child...   TRANSURETHRAL RESECTION OF BLADDER TUMOR N/A 01/03/2022   Procedure: TRANSURETHRAL RESECTION OF BLADDER TUMOR (TURBT)/ CYSTOSCOPY/ EXAM UNDER ANESTHESIA, BILATERAL RETROGRADE/ BLADDER BIOPSIES;  Surgeon: Heloise Purpura, MD;  Location: [facility] ORS;  Service: Urology;  Laterality:  N/A;  GENERAL ANESTHESIA WITH PARALYSIS     Social History:   reports that he has been smoking cigarettes. He started smoking about 61 years ago. He  has a 12.3 pack-year smoking history. He has never been exposed to tobacco smoke. He has never used smokeless tobacco. He reports that he does not currently use alcohol. He reports that he does not use drugs.   Family History:  His family history is negative for Colon cancer, Stomach cancer, and Rectal cancer.   Allergies Allergies  Allergen Reactions   Sulfa Antibiotics     Rash, light headed, shaking   Penicillins Other (See Comments)    hives severe as a child    Tape     Pulls skin off     Home Medications  Prior to Admission medications   Medication Sig Start Date End Date Taking? Authorizing Provider  albuterol (PROVENTIL) (2.5 MG/3ML) 0.083% nebulizer solution Inhale 3 mLs (2.5 mg total) by nebulization every 4 (four) hours as needed for wheezing or shortness of breath. 09/17/22   Elgergawy, Leana Roe, MD  cholecalciferol (VITAMIN D3) 25 MCG (1000 UNIT) tablet Take 1,000 Units by mouth daily.    [provider]  lidocaine-prilocaine (EMLA) cream Apply 1 Application topically Every Tuesday,Thursday,and Saturday with dialysis. 01/02/23   [provider]  metoprolol succinate (TOPROL-XL) 50 MG 24 hr tablet Take 50mg  (1 tablet) with or immediately following a meal daily. 05/23/23   Everrett Coombe, DO  mupirocin ointment (BACTROBAN) 2 % Apply 1 Application topically 2 (two) times daily. 02/28/23   [provider]  olopatadine (PATANOL) 0.1 % ophthalmic solution Place 1 drop into both eyes daily as needed for dry eyes. 10/09/21   [provider]  sevelamer carbonate (RENVELA) 800 MG tablet Take 800-1,600 mg by mouth See admin instructions. Take 1600 mg with each meal and 800 mg with each snack 09/20/22   [provider]  triamcinolone cream (KENALOG) 0.1 % Apply 1 Application topically 2 (two) times  daily as needed (itching). 02/24/23   [provider]     Critical care time: 45 min.     Steffanie Dunn, DO 08/10/23 6:13 PM [facility]  For contact information, see Amion. If no response to pager, please call PCCM consult pager. After hours, 7PM- 7AM, please call Elink.

## 2023-08-10 NOTE — Consult Note (Signed)
Reason for Consult: ESRD Referring Physician: Dr. 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  Some fluid on CXR (no rales) and SOB likely more from COPD; he has received albuterol. He left at his EDW on Saturday despite a shortened treatment (2hr 55) and will plan on dialysis tomorrow as well. Avoid nephrotoxic medications including NSAIDs and iodinated intravenous contrast exposure unless the latter is absolutely indicated.   Preferred narcotic agents for pain control are hydromorphone, fentanyl, and methadone. Morphine should not be used.  Avoid Baclofen and avoid oral sodium phosphate and magnesium citrate based laxatives / bowel preps.  Continue strict Input and Output monitoring. Will monitor the patient closely with you and intervene or adjust therapy as indicated by changes in clinical status/labs  Renal osteodystrophy - phos 7.7 -> resume binders. HTN - resume home regimen. 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 Anemia of chronic disease - last received Mircera 60 on 7/2 and Hb was on 10.7 on 9/26. OSA + COPD - should be on CPAP; sats are 99% on 60% BiPAP  HPI: Jeremy [name] is an 82 y.o. 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 Malen Gauze before starting dialysis now TTS 3hr at [facility] with Dr. Thedore Mins. Time was recently  decreased and he usually signs off still another 30 minutes early but has been reaching his EDW leaving at 73.2kg on Saturday. He is presenting with shortness of breath even though he is on 3L O2 at home; he continues to smoke heavily per spouse who was bedside. He was on CPAP with EMS with O2 91%. PCO2 on blood gas was 75.4 with a K of 5.3. BNP 1328. CXR showed mild CM with vascular congestion. Spouse denies fever, chills, nausea but at baseline he is difficult to arouse and is hard of hearing, often refusing to use his hearing aids.   ROS Pertinent items are noted in HPI.  Chemistry and CBC: Creat  Date/Time Value Ref Range Status  04/23/2022 12:00 AM 3.23 (H) 0.70 - 1.22 mg/dL Final  16/08/9603 54:09 AM 1.67 (H) 0.70 - 1.11 mg/dL Final    Comment:    For patients >83 years of age, the reference limit for Creatinine is approximately 13% higher for people identified as African-American. .    Creatinine, Ser  Date/Time Value Ref Range Status  08/10/2023 03:59 PM 5.65 (H) 0.61 - 1.24 mg/dL Final  81/19/1478 29:56 AM 8.20 (H) 0.61 - 1.24 mg/dL Final  21/30/8657 84:69 AM 5.90 (H) 0.61 - 1.24 mg/dL Final  62/95/2841 32:44 AM 9.50 (H) 0.61 - 1.24 mg/dL Final  11/13/7251 66:44 AM 7.13 (H) 0.61 - 1.24 mg/dL Final  03/47/4259 56:38 AM 6.92 (H) 0.61 - 1.24 mg/dL Final  75/64/3329 51:88 AM 7.30 (H) 0.61 - 1.24 mg/dL Final  41/66/0630 16:01 AM 7.67 (H) 0.61 - 1.24 mg/dL Final  09/32/3557 32:20 AM 7.85 (H)  0.61 - 1.24 mg/dL Final  09/81/1914 78:29 AM 7.82 (H) 0.61 - 1.24 mg/dL Final  56/21/3086 57:84 AM 2.03 (H) 0.61 - 1.24 mg/dL Final  69/62/9528 41:32 AM 1.58 (H) 0.40 - 1.50 mg/dL Final  44/11/270 53:66 AM 1.53 (H) 0.40 - 1.50 mg/dL Final  44/01/4741 59:56 AM 1.39 0.40 - 1.50 mg/dL Final  38/75/6433 29:51 PM 1.22 0.40 - 1.50 mg/dL Final  88/41/6606 30:16 AM 1.21 0.61 - 1.24 mg/dL Final  11/19/3233 57:32 AM 1.55 (H) 0.61 - 1.24 mg/dL Final  20/25/4270 62:37 PM 1.52 (H) 0.61 - 1.24 mg/dL  Final  62/83/1517 61:60 AM 1.22 0.61 - 1.24 mg/dL Final  73/71/0626 94:85 AM 1.18 0.40 - 1.50 mg/dL Final  46/27/0350 09:38 AM 1.26 0.40 - 1.50 mg/dL Final  18/29/9371 69:67 PM 1.19 0.40 - 1.50 mg/dL Final  89/38/1017 51:02 PM 1.56 (H) 0.40 - 1.50 mg/dL Final  58/52/7782 42:35 AM 1.1 0.4 - 1.5 mg/dL Final  36/14/4315 40:08 PM 0.88 0.50 - 1.35 mg/dL Final  67/61/9509 32:67 AM 1.2 0.4 - 1.5 mg/dL Final  12/45/8099 83:38 AM 1.1 0.4 - 1.5 mg/dL Final  25/03/3975 73:41 AM 1.1 0.4 - 1.5 mg/dL Final  93/79/0240 97:35 AM 1.1 0.4 - 1.5 mg/dL Final  32/99/2426 83:41 AM 1.0 0.4 - 1.5 mg/dL Final  96/22/2979 89:21 AM 1.1 0.4 - 1.5 mg/dL Final    Comment:    See lab report for associated comment(s)  06/11/2007 09:29 AM 1.1 0.4 - 1.5 mg/dL Final   Recent Labs  Lab 08/10/23 1559 08/10/23 1611  NA 137 135  K 5.3* 5.3*  CL 90*  --   CO2 30  --   GLUCOSE 102*  --   BUN 32*  --   CREATININE 5.65*  --   CALCIUM 8.6*  --    Recent Labs  Lab 08/10/23 1559 08/10/23 1611  WBC 6.6  --   HGB 10.8* 11.9*  HCT 34.7* 35.0*  MCV 102.7*  --   PLT 268  --    Liver Function Tests: Recent Labs  Lab 08/10/23 1559  AST 13*  ALT 10  ALKPHOS 65  BILITOT 0.6  PROT 7.2  ALBUMIN 3.3*   No results for input(s): "LIPASE", "AMYLASE" in the last 168 hours. No results for input(s): "AMMONIA" in the last 168 hours. Cardiac Enzymes: No results for input(s): "CKTOTAL", "CKMB", "CKMBINDEX", "TROPONINI" in the last 168 hours. Iron Studies: No results for input(s): "IRON", "TIBC", "TRANSFERRIN", "FERRITIN" in the last 72 hours. PT/INR: @LABRCNTIP (inr:5)  Xrays/Other Studies: ) Results for orders placed or performed during the hospital encounter of 08/10/23 (from the past 48 hour(s))  CBC     Status: Abnormal   Collection Time: 08/10/23  3:59 PM  Result Value Ref Range   WBC 6.6 4.0 - 10.5 K/uL   RBC 3.38 (L) 4.22 - 5.81 MIL/uL   Hemoglobin 10.8 (L) 13.0 - 17.0 g/dL   HCT 19.4 (L) 17.4 - 08.1 %    MCV 102.7 (H) 80.0 - 100.0 fL   MCH 32.0 26.0 - 34.0 pg   MCHC 31.1 30.0 - 36.0 g/dL   RDW 44.8 18.5 - 63.1 %   Platelets 268 150 - 400 K/uL   nRBC 0.0 0.0 - 0.2 %    Comment: Performed at [facility], [address], [place], [place] [address]  Comprehensive metabolic panel     Status: Abnormal   Collection Time: 08/10/23  3:59 PM  Result Value Ref Range   Sodium  137 135 - 145 mmol/L   Potassium 5.3 (H) 3.5 - 5.1 mmol/L   Chloride 90 (L) 98 - 111 mmol/L   CO2 30 22 - 32 mmol/L   Glucose, Bld 102 (H) 70 - 99 mg/dL    Comment: Glucose reference range applies only to samples taken after fasting for at least 8 hours.   BUN 32 (H) 8 - 23 mg/dL   Creatinine, Ser 2.13 (H) 0.61 - 1.24 mg/dL   Calcium 8.6 (L) 8.9 - 10.3 mg/dL   Total Protein 7.2 6.5 - 8.1 g/dL   Albumin 3.3 (L) 3.5 - 5.0 g/dL   AST 13 (L) 15 - 41 U/L   ALT 10 0 - 44 U/L   Alkaline Phosphatase 65 38 - 126 U/L   Total Bilirubin 0.6 0.3 - 1.2 mg/dL   GFR, Estimated 9 (L) >60 mL/min    Comment: (NOTE) Calculated using the CKD-EPI Creatinine Equation (2021)    Anion gap 17 (H) 5 - 15    Comment: Performed at [facility], [address], [place], [place] [address]  Troponin I (High Sensitivity)     Status: Abnormal   Collection Time: 08/10/23  3:59 PM  Result Value Ref Range   Troponin I (High Sensitivity) 89 (H) <18 ng/L    Comment: (NOTE) Elevated high sensitivity troponin I (hsTnI) values and significant  changes across serial measurements may suggest ACS but many other  chronic and acute conditions are known to elevate hsTnI results.  Refer to the "Links" section for chest pain algorithms and additional  guidance. Performed at [facility], [address] [place], [place] [address]   SARS Coronavirus 2 by RT PCR (hospital order, performed in [facility] hospital lab) *cepheid single result test* Anterior Nasal Swab     Status: None   Collection Time: 08/10/23  4:08 PM   Specimen: Anterior  Nasal Swab  Result Value Ref Range   SARS Coronavirus 2 by RT PCR NEGATIVE NEGATIVE    Comment: Performed at [facility], [address] [place], [place] [address]  I-Stat venous blood gas, ED     Status: Abnormal   Collection Time: 08/10/23  4:11 PM  Result Value Ref Range   pH, Ven 7.244 (L) 7.25 - 7.43   pCO2, Ven 75.4 (HH) 44 - 60 mmHg   pO2, Ven 38 32 - 45 mmHg   Bicarbonate 32.6 (H) 20.0 - 28.0 mmol/L   TCO2 35 (H) 22 - 32 mmol/L   O2 Saturation 61 %   Acid-Base Excess 3.0 (H) 0.0 - 2.0 mmol/L   Sodium 135 135 - 145 mmol/L   Potassium 5.3 (H) 3.5 - 5.1 mmol/L   Calcium, Ion 1.10 (L) 1.15 - 1.40 mmol/L   HCT 35.0 (L) 39.0 - 52.0 %   Hemoglobin 11.9 (L) 13.0 - 17.0 g/dL   Sample type VENOUS    Comment NOTIFIED PHYSICIAN    DG Chest Portable 1 View  Result Date: 08/10/2023 CLINICAL DATA:  Shortness of breath. EXAM: PORTABLE CHEST 1 VIEW COMPARISON:  Chest radiograph dated 10/24/2022. FINDINGS: There is diffuse chronic interstitial coarsening. There is mild cardiomegaly with vascular congestion. No focal consolidation, pleural effusion, or pneumothorax. No acute osseous pathology. IMPRESSION: Mild cardiomegaly with vascular congestion. Electronically Signed   By: Elgie Collard M.D.   On: 08/10/2023 16:25    PMH:   Past Medical History:  Diagnosis Date   Anemia    Cancer (HCC)  patient and family have not been told that he has cancer.   Chronic kidney disease    dialysis T/Th/Sa   COPD (chronic obstructive pulmonary disease) (HCC)    Enlarged prostate    History of blood transfusion    Hyperlipidemia    Hypertension    Self-catheterizes urinary bladder    pt. does this morning and evening--been doing this since 11/2016   Sleep apnea    not using a cpap machine    PSH:   Past Surgical History:  Procedure Laterality Date   A/V FISTULAGRAM Left 03/12/2023   Procedure: A/V Fistulagram;  Surgeon: Victorino Sparrow, MD;  Location: [facility] INVASIVE CV LAB;  Service:  Cardiovascular;  Laterality: Left;   AV FISTULA PLACEMENT Left 10/16/2022   Procedure: LEFT BRACHIOCEPHALIC ARTERIOVENOUS (AV) FISTULA CREATION;  Surgeon: Cephus Shelling, MD;  Location: [facility] OR;  Service: Vascular;  Laterality: Left;   BACK SURGERY     BLADDER TUMOR EXCISION  2015   benign   BLEPHAROPLASTY  12/2018   LIGATION OF COMPETING BRANCHES OF ARTERIOVENOUS FISTULA Left 03/17/2023   Procedure: LEFT ARM FISTULA BRANCH LIGATION;  Surgeon: Victorino Sparrow, MD;  Location: [facility] OR;  Service: Vascular;  Laterality: Left;   LUMBAR LAMINECTOMY/DECOMPRESSION MICRODISCECTOMY Bilateral 08/25/2017   Procedure: Bilateral Lumbar Three- Four, Lumbar Four- Five, Lumbar Five- Sacral One Laminectomy;  Surgeon: Barnett Abu, MD;  Location: [facility] OR;  Service: Neurosurgery;  Laterality: Bilateral;  Bilateral L3-4 L4-5 L5-S1 Laminectomy   SKIN CANCER EXCISION     Surg x2...   TONSILLECTOMY AND ADENOIDECTOMY     Surg as a child...   TRANSURETHRAL RESECTION OF BLADDER TUMOR N/A 01/03/2022   Procedure: TRANSURETHRAL RESECTION OF BLADDER TUMOR (TURBT)/ CYSTOSCOPY/ EXAM UNDER ANESTHESIA, BILATERAL RETROGRADE/ BLADDER BIOPSIES;  Surgeon: Heloise Purpura, MD;  Location: [facility] ORS;  Service: Urology;  Laterality: N/A;  GENERAL ANESTHESIA WITH PARALYSIS    Allergies:  Allergies  Allergen Reactions   Sulfa Antibiotics     Rash, light headed, shaking   Penicillins Other (See Comments)    hives severe as a child    Tape     Pulls skin off    Medications:   Prior to Admission medications   Medication Sig Start Date End Date Taking? Authorizing Provider  albuterol (PROVENTIL) (2.5 MG/3ML) 0.083% nebulizer solution Inhale 3 mLs (2.5 mg total) by nebulization every 4 (four) hours as needed for wheezing or shortness of breath. 09/17/22   Elgergawy, Leana Roe, MD  cholecalciferol (VITAMIN D3) 25 MCG (1000 UNIT) tablet Take 1,000 Units by mouth daily.    [provider]  lidocaine-prilocaine (EMLA) cream Apply 1  Application topically Every Tuesday,Thursday,and Saturday with dialysis. 01/02/23   [provider]  metoprolol succinate (TOPROL-XL) 50 MG 24 hr tablet Take 50mg  (1 tablet) with or immediately following a meal daily. 05/23/23   Everrett Coombe, DO  mupirocin ointment (BACTROBAN) 2 % Apply 1 Application topically 2 (two) times daily. 02/28/23   [provider]  olopatadine (PATANOL) 0.1 % ophthalmic solution Place 1 drop into both eyes daily as needed for dry eyes. 10/09/21   [provider]  sevelamer carbonate (RENVELA) 800 MG tablet Take 800-1,600 mg by mouth See admin instructions. Take 1600 mg with each meal and 800 mg with each snack 09/20/22   [provider]  triamcinolone cream (KENALOG) 0.1 % Apply 1 Application topically 2 (two) times daily as needed (itching). 02/24/23   [provider]    Discontinued Meds:  Medications Discontinued During This Encounter  Medication Reason   albuterol (PROVENTIL,VENTOLIN) solution continuous neb     Social History:  reports that he has been smoking cigarettes. He started smoking about 61 years ago. He has a 12.3 pack-year smoking history. He has never been exposed to tobacco smoke. He has never used smokeless tobacco. He reports that he does not currently use alcohol. He reports that he does not use drugs.  Family History:   Family History  Problem Relation Age of Onset   Colon cancer Neg Hx    Stomach cancer Neg Hx    Rectal cancer Neg Hx     Blood pressure (!) 159/60, pulse 80, temperature 98.1 F (36.7 C), temperature source Oral, resp. rate 17, SpO2 100%. General appearance: mild distress and slowed mentation Head: Normocephalic, without obvious abnormality, atraumatic Eyes: negative Neck: no adenopathy, no carotid bruit, no JVD, supple, symmetrical, trachea midline, and thyroid not enlarged, symmetric, no tenderness/mass/nodules Back: symmetric, no curvature. ROM normal. No CVA  tenderness. Resp: clear to auscultation bilaterally Cardio: regular rate and rhythm GI: soft, non-tender; bowel sounds normal; no masses,  no organomegaly Extremities: extremities normal, atraumatic, no cyanosis or edema Pulses: 2+ and symmetric Skin: Skin color, texture, turgor normal. No rashes or lesions Access: Lt BCF great bruit        Nephtali Docken, Len Blalock, MD 08/10/2023, 5:31 PM

## 2023-08-10 NOTE — ED Notes (Signed)
Pt has received 15mg  total albuterol in the continuous neb.

## 2023-08-10 NOTE — ED Notes (Signed)
Pt more alert  moving around more pt  reaching for his  tube  bolused with 10 then incrreased  the drip to 

## 2023-08-10 NOTE — ED Notes (Signed)
Report called to rn on 3 m  ready for the pt

## 2023-08-10 NOTE — ED Provider Notes (Signed)
[place] EMERGENCY DEPARTMENT AT [facility] Provider Note   CSN: [id number] Arrival date & time: 08/10/23  1550     History {Add pertinent medical, surgical, social history, OB history to HPI:1} No chief complaint on file.   Jeremy [name] is a 82 y.o. male.  82 year old male with a history of COPD, ESRD on Tuesday, Thursday, Saturday dialysis, HTN, and HLD who presents to the emergency department with shortness of breath.  Patient was at dialysis yesterday and had only half session.  Reports that this morning was not feeling well and was staying in bed longer than usual so his wife checked his oxygen levels which were low.  When EMS arrived he was satting 50% on room air.  They placed him on BiPAP and brought him into the emergency department.  Patient denies any chest discomfort at this time.  Says he has been compliant with his dialysis otherwise.       Home Medications Prior to Admission medications   Medication Sig Start Date End Date Taking? Authorizing Provider  albuterol (PROVENTIL) (2.5 MG/3ML) 0.083% nebulizer solution Inhale 3 mLs (2.5 mg total) by nebulization every 4 (four) hours as needed for wheezing or shortness of breath. 09/17/22   Elgergawy, Leana Roe, MD  cholecalciferol (VITAMIN D3) 25 MCG (1000 UNIT) tablet Take 1,000 Units by mouth daily.    [provider]  lidocaine-prilocaine (EMLA) cream Apply 1 Application topically Every Tuesday,Thursday,and Saturday with dialysis. 01/02/23   [provider]  metoprolol succinate (TOPROL-XL) 50 MG 24 hr tablet Take 50mg  (1 tablet) with or immediately following a meal daily. 05/23/23   Everrett Coombe, DO  mupirocin ointment (BACTROBAN) 2 % Apply 1 Application topically 2 (two) times daily. 02/28/23   [provider]  olopatadine (PATANOL) 0.1 % ophthalmic solution Place 1 drop into both eyes daily as needed for dry eyes. 10/09/21   [provider]  sevelamer carbonate (RENVELA) 800  MG tablet Take 800-1,600 mg by mouth See admin instructions. Take 1600 mg with each meal and 800 mg with each snack 09/20/22   [provider]  triamcinolone cream (KENALOG) 0.1 % Apply 1 Application topically 2 (two) times daily as needed (itching). 02/24/23   [provider]      Allergies    Sulfa antibiotics, Penicillins, and Tape    Review of Systems   Review of Systems  Physical Exam Updated Vital Signs There were no vitals taken for this visit. Physical Exam Vitals and nursing note reviewed.  Constitutional:      General: He is in acute distress.     Appearance: He is well-developed. He is ill-appearing.     Comments: On BiPAP.  Briefly taken off and is alert and oriented x 3.  HENT:     Head: Normocephalic and atraumatic.     Right Ear: External ear normal.     Left Ear: External ear normal.     Nose: Nose normal.  Eyes:     Extraocular Movements: Extraocular movements intact.     Conjunctiva/sclera: Conjunctivae normal.     Pupils: Pupils are equal, round, and reactive to light.  Cardiovascular:     Rate and Rhythm: Normal rate and regular rhythm.     Heart sounds: Normal heart sounds.  Pulmonary:     Effort: Respiratory distress present.     Comments: On BiPAP.  Breath sounds diminished Abdominal:     General: There is no distension.     Palpations: Abdomen is  soft. There is no mass.     Tenderness: There is no abdominal tenderness. There is no guarding.  Musculoskeletal:     Cervical back: Normal range of motion and neck supple.     Right lower leg: No edema.     Left lower leg: No edema.     Comments: Fistula in left upper extremity with bruit and thrill  Skin:    General: Skin is warm and dry.  Neurological:     Mental Status: He is alert. Mental status is at baseline.  Psychiatric:        Mood and Affect: Mood normal.        Behavior: Behavior normal.     ED Results / Procedures / Treatments   Labs (all labs ordered are listed, but  only abnormal results are displayed) Labs Reviewed  CBC  COMPREHENSIVE METABOLIC PANEL  BLOOD GAS, VENOUS  BRAIN NATRIURETIC PEPTIDE    EKG EKG Interpretation Date/Time:  Sunday August 10 2023 15:55:23 EDT Ventricular Rate:  81 PR Interval:  165 QRS Duration:  96 QT Interval:  407 QTC Calculation: 473 R Axis:   -26  Text Interpretation: Sinus rhythm Borderline left axis deviation Nonspecific repol abnormality, lateral leads When compared to prior ST depressions are now more prominent in anterolateral leads Confirmed by Vonita Moss [phone]) on 08/10/2023 3:59:26 PM  Radiology No results found.  Procedures Procedures  {Document cardiac monitor, telemetry assessment procedure when appropriate:1}  Medications Ordered in ED Medications - No data to display  ED Course/ Medical Decision Making/ A&P   {   Click here for ABCD2, HEART and other calculatorsREFRESH Note before signing :1}                              Medical Decision Making Amount and/or Complexity of Data Reviewed Labs: ordered. Radiology: ordered.   ***  {Document critical care time when appropriate:1} {Document review of labs and clinical decision tools ie heart score, Chads2Vasc2 etc:1}  {Document your independent review of radiology images, and any outside records:1} {Document your discussion with family members, caretakers, and with consultants:1} {Document social determinants of health affecting pt's care:1} {Document your decision making why or why not admission, treatments were needed:1} Final Clinical Impression(s) / ED Diagnoses Final diagnoses:  None    Rx / DC Orders ED Discharge Orders     None

## 2023-08-10 NOTE — Progress Notes (Signed)
Pharmacy Antibiotic Note  Jeremy [name] is a 82 y.o. male for which pharmacy has been consulted for cefepime and vancomycin dosing for sepsis.  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 Patient presenting with SOB.  WBC 6.6; LA 0.8; T 98.1; HR 93; RR 21  Plan: Vancomycin 1500mg  once - repeat per HD plan Metronidazole per MD Ceftriaxone 1g q24hr (given after HD) Monitor WBC, fever, renal function, cultures De-escalate when able F/u Nephrology plan     Temp (24hrs), Avg:98.1 F (36.7 C), Min:98.1 F (36.7 C), Max:98.1 F (36.7 C)  Recent Labs  Lab 08/10/23 1559 08/10/23 1713  WBC 6.6  --   CREATININE 5.65*  --   LATICACIDVEN  --  0.8    CrCl cannot be calculated (Unknown ideal weight.).    Allergies  Allergen Reactions   Sulfa Antibiotics     Rash, light headed, shaking   Penicillins Other (See Comments)    hives severe as a child    Tape     Pulls skin off   Microbiology results: Pending  Thank you for allowing pharmacy to be a part of this patient's care.  Delmar Landau, PharmD, BCPS 08/10/2023 6:59 PM ED Clinical Pharmacist -  [phone]

## 2023-08-10 NOTE — ED Notes (Signed)
Thgen pts wife feelslike the pt is starting to move too mush    given a little more sedation

## 2023-08-10 NOTE — Progress Notes (Signed)
eLink Physician-Brief Progress Note Patient Name: Jeremy [name] DOB: 1941-07-26 MRN: [id number]   Date of Service  08/10/2023  HPI/Events of Note  82/M with history of COPD on home O2, ESRD, presenting with lethargy. Evaluation was significant for  pulmonary edema, CO2 retention. He was initially placed on BIPAP, but ultimately required intubation and mechanical ventilation. He was subsequently admitted to the ICU.   eICU Interventions  Acute on chronic hypercapnic respiratory failure - Started on vent support    - Maintain on TV 4-74ml/kg PBW. Target plateau pressures <30    - titrate FiO2, PEEP to maintain SpO2 ~90%    - Will follow serial ABG, make further vent setting changes as needed.  - Continue bronchodilators, systemic steroids, for underlying COPD - Pt also started on empiric antibiotics.  - Pt planned for repeat dialysis session in the morning.  - Renally dose medications, avoid nephrotoxins.  - On fentanyl for pain control and sedation. Plan for daily sedation holiday, SBT.         Gwendloyn Forsee M DELA CRUZ 08/10/2023, 10:51 PM  1:11 AM Notified of lactate 2.1 from 0.8 Held off bolus given fluid overload state.  On levophed, dose requirements have come down.  Will continue to follow serial lactate.

## 2023-08-10 NOTE — ED Triage Notes (Signed)
Pt BIB [facility] from home with reports of SHOB. Pt wears 3L chronically. Pt was 52% on his home O2.  PT arrived on CPAP with O2 91%. PT had partial dialysis yesterday.

## 2023-08-10 NOTE — ED Notes (Signed)
ED TO INPATIENT HANDOFF REPORT  ED Nurse Name and Phone #: [email or web address] [phone]   S Name/Age/Gender Jeremy [name] 82 y.o. male Room/Bed: RESUSC/RESUSC  Code Status   Code Status: Full Code  Home/SNF/Other Home Pt sedated on the vent  Is this baseline? No   Triage Complete: Triage complete  Chief Complaint Acute respiratory failure with hypoxia and hypercarbia (HCC) [J96.01, J96.02]  Triage Note Pt BIB [facility] from home with reports of SHOB. Pt wears 3L chronically. Pt was 52% on his home O2.  PT arrived on CPAP with O2 91%. PT had partial dialysis yesterday.    Allergies Allergies  Allergen Reactions   Sulfa Antibiotics     Rash, light headed, shaking   Penicillins Other (See Comments)    hives severe as a child    Tape     Pulls skin off    Level of Care/Admitting Diagnosis ED Disposition     ED Disposition  Admit   Condition  --   Comment  Hospital Area: [facility] [[id number]]  Level of Care: ICU [6]  May admit patient to [facility] or [facility] if equivalent level of care is available:: Yes  Covid Evaluation: Confirmed COVID Negative  Diagnosis: Acute respiratory failure with hypoxia and hypercarbia [facility]) [[id number]]  Admitting Physician: Steffanie Dunn [[id number]]  Attending Physician: Steffanie Dunn [[id number]]  Certification:: I certify this patient will need inpatient services for at least 2 midnights  Expected Medical Readiness: 08/19/2023          B Medical/Surgery History Past Medical History:  Diagnosis Date   Anemia    Cancer (HCC)    patient and family have not been told that he has cancer.   Chronic kidney disease    dialysis T/Th/Sa   COPD (chronic obstructive pulmonary disease) (HCC)    Enlarged prostate    History of blood transfusion    Hyperlipidemia    Hypertension    Self-catheterizes urinary bladder    pt. does this morning and evening--been doing this since 11/2016   Sleep apnea    not using a cpap machine   Past  Surgical History:  Procedure Laterality Date   A/V FISTULAGRAM Left 03/12/2023   Procedure: A/V Fistulagram;  Surgeon: Victorino Sparrow, MD;  Location: [facility] INVASIVE CV LAB;  Service: Cardiovascular;  Laterality: Left;   AV FISTULA PLACEMENT Left 10/16/2022   Procedure: LEFT BRACHIOCEPHALIC ARTERIOVENOUS (AV) FISTULA CREATION;  Surgeon: Cephus Shelling, MD;  Location: [facility] OR;  Service: Vascular;  Laterality: Left;   BACK SURGERY     BLADDER TUMOR EXCISION  2015   benign   BLEPHAROPLASTY  12/2018   LIGATION OF COMPETING BRANCHES OF ARTERIOVENOUS FISTULA Left 03/17/2023   Procedure: LEFT ARM FISTULA BRANCH LIGATION;  Surgeon: Victorino Sparrow, MD;  Location: [facility] OR;  Service: Vascular;  Laterality: Left;   LUMBAR LAMINECTOMY/DECOMPRESSION MICRODISCECTOMY Bilateral 08/25/2017   Procedure: Bilateral Lumbar Three- Four, Lumbar Four- Five, Lumbar Five- Sacral One Laminectomy;  Surgeon: Barnett Abu, MD;  Location: [facility] OR;  Service: Neurosurgery;  Laterality: Bilateral;  Bilateral L3-4 L4-5 L5-S1 Laminectomy   SKIN CANCER EXCISION     Surg x2...   TONSILLECTOMY AND ADENOIDECTOMY     Surg as a child...   TRANSURETHRAL RESECTION OF BLADDER TUMOR N/A 01/03/2022   Procedure: TRANSURETHRAL RESECTION OF BLADDER TUMOR (TURBT)/ CYSTOSCOPY/ EXAM UNDER ANESTHESIA, BILATERAL RETROGRADE/ BLADDER BIOPSIES;  Surgeon: Heloise Purpura, MD;  Location: [facility] ORS;  Service: Urology;  Laterality: N/A;  GENERAL ANESTHESIA WITH PARALYSIS     A IV Location/Drains/Wounds Patient Lines/Drains/Airways Status     Active Line/Drains/Airways     Name Placement date Placement time Site Days   Peripheral IV 08/10/23 16 G Anterior;Distal;Right;Upper Arm 08/10/23  1603  Arm  less than 1   Peripheral IV 08/10/23 22 G Anterior;Distal;Right Forearm 08/10/23  1750  Forearm  less than 1   Fistula / Graft Left Upper arm Arteriovenous fistula 10/16/22  1059  Upper arm  298   NG/OG Vented/Dual Lumen 16 Fr. Oral 08/10/23  1856  Oral  less  than 1   Urethral Catheter Huntley Dec, RN Non-latex 16 Fr. 08/10/23  1905  Non-latex  less than 1   Airway 7.5 mm 08/10/23  1852  -- less than 1   Wound / Incision (Open or Dehisced) 09/12/22 Skin tear Shoulder Left;Posterior 09/12/22  2213  Shoulder  332   Wound / Incision (Open or Dehisced) 09/12/22 Skin tear Elbow Left;Posterior 09/12/22  2214  Elbow  332   Wound / Incision (Open or Dehisced) 09/12/22 Skin tear Thigh Distal;Left;Posterior 09/12/22  2215  Thigh  332   Wound / Incision (Open or Dehisced) 09/16/22 Puncture Flank Right 09/16/22  1030  Flank  328            Intake/Output Last 24 hours No intake or output data in the 24 hours ending 08/10/23 1952  Labs/Imaging Results for orders placed or performed during the hospital encounter of 08/10/23 (from the past 48 hour(s))  CBC     Status: Abnormal   Collection Time: 08/10/23  3:59 PM  Result Value Ref Range   WBC 6.6 4.0 - 10.5 K/uL   RBC 3.38 (L) 4.22 - 5.81 MIL/uL   Hemoglobin 10.8 (L) 13.0 - 17.0 g/dL   HCT 30.8 (L) 65.7 - 84.6 %   MCV 102.7 (H) 80.0 - 100.0 fL   MCH 32.0 26.0 - 34.0 pg   MCHC 31.1 30.0 - 36.0 g/dL   RDW 96.2 95.2 - 84.1 %   Platelets 268 150 - 400 K/uL   nRBC 0.0 0.0 - 0.2 %    Comment: Performed at [facility], [address] [place], [place] [address]  Comprehensive metabolic panel     Status: Abnormal   Collection Time: 08/10/23  3:59 PM  Result Value Ref Range   Sodium 137 135 - 145 mmol/L   Potassium 5.3 (H) 3.5 - 5.1 mmol/L   Chloride 90 (L) 98 - 111 mmol/L   CO2 30 22 - 32 mmol/L   Glucose, Bld 102 (H) 70 - 99 mg/dL    Comment: Glucose reference range applies only to samples taken after fasting for at least 8 hours.   BUN 32 (H) 8 - 23 mg/dL   Creatinine, Ser 1.02 (H) 0.61 - 1.24 mg/dL   Calcium 8.6 (L) 8.9 - 10.3 mg/dL   Total Protein 7.2 6.5 - 8.1 g/dL   Albumin 3.3 (L) 3.5 - 5.0 g/dL   AST 13 (L) 15 - 41 U/L   ALT 10 0 - 44 U/L   Alkaline Phosphatase 65 38 - 126 U/L   Total  Bilirubin 0.6 0.3 - 1.2 mg/dL   GFR, Estimated 9 (L) >60 mL/min    Comment: (NOTE) Calculated using the CKD-EPI Creatinine Equation (2021)    Anion gap 17 (H) 5 - 15    Comment: Performed at [facility], [address] [place], [place] [address]  Troponin I (High  Sensitivity)     Status: Abnormal   Collection Time: 08/10/23  3:59 PM  Result Value Ref Range   Troponin I (High Sensitivity) 89 (H) <18 ng/L    Comment: (NOTE) Elevated high sensitivity troponin I (hsTnI) values and significant  changes across serial measurements may suggest ACS but many other  chronic and acute conditions are known to elevate hsTnI results.  Refer to the "Links" section for chest pain algorithms and additional  guidance. Performed at [facility], [address] [place], [place] [address]   SARS Coronavirus 2 by RT PCR (hospital order, performed in [facility] hospital lab) *cepheid single result test* Anterior Nasal Swab     Status: None   Collection Time: 08/10/23  4:08 PM   Specimen: Anterior Nasal Swab  Result Value Ref Range   SARS Coronavirus 2 by RT PCR NEGATIVE NEGATIVE    Comment: Performed at [facility], [address] [place], [place] [address]  I-Stat venous blood gas, ED     Status: Abnormal   Collection Time: 08/10/23  4:11 PM  Result Value Ref Range   pH, Ven 7.244 (L) 7.25 - 7.43   pCO2, Ven 75.4 (HH) 44 - 60 mmHg   pO2, Ven 38 32 - 45 mmHg   Bicarbonate 32.6 (H) 20.0 - 28.0 mmol/L   TCO2 35 (H) 22 - 32 mmol/L   O2 Saturation 61 %   Acid-Base Excess 3.0 (H) 0.0 - 2.0 mmol/L   Sodium 135 135 - 145 mmol/L   Potassium 5.3 (H) 3.5 - 5.1 mmol/L   Calcium, Ion 1.10 (L) 1.15 - 1.40 mmol/L   HCT 35.0 (L) 39.0 - 52.0 %   Hemoglobin 11.9 (L) 13.0 - 17.0 g/dL   Sample type VENOUS    Comment NOTIFIED PHYSICIAN   Lactic acid, plasma     Status: None   Collection Time: 08/10/23  5:13 PM  Result Value Ref Range   Lactic Acid, Venous 0.8 0.5 - 1.9 mmol/L    Comment:  Performed at [facility], [address] [place], [place] [address]  Troponin I (High Sensitivity)     Status: Abnormal   Collection Time: 08/10/23  6:01 PM  Result Value Ref Range   Troponin I (High Sensitivity) 146 (HH) <18 ng/L    Comment: CRITICAL RESULT CALLED TO, READ BACK BY AND VERIFIED WITH M DOSS RN AT [phone] BY D LONG (NOTE) Elevated high sensitivity troponin I (hsTnI) values and significant  changes across serial measurements may suggest ACS but many other  chronic and acute conditions are known to elevate hsTnI results.  Refer to the "Links" section for chest pain algorithms and additional  guidance. Performed at [facility], [address], [place], [place] [address]   Urinalysis, Routine w reflex microscopic -Urine, Catheterized     Status: Abnormal   Collection Time: 08/10/23  7:05 PM  Result Value Ref Range   Color, Urine YELLOW YELLOW   APPearance TURBID (A) CLEAR   Specific Gravity, Urine 1.012 1.005 - 1.030   pH 7.0 5.0 - 8.0   Glucose, UA NEGATIVE NEGATIVE mg/dL   Hgb urine dipstick SMALL (A) NEGATIVE   Bilirubin Urine NEGATIVE NEGATIVE   Ketones, ur NEGATIVE NEGATIVE mg/dL   Protein, ur >=578 (A) NEGATIVE mg/dL   Nitrite NEGATIVE NEGATIVE   Leukocytes,Ua MODERATE (A) NEGATIVE   RBC / HPF 11-20 0 - 5 RBC/hpf   WBC, UA >50 0 - 5  WBC/hpf   Bacteria, UA FEW (A) NONE SEEN   Squamous Epithelial / HPF 0-5 0 - 5 /HPF   WBC Clumps PRESENT     Comment: Performed at [facility], [address], [place], [place] [address]  I-Stat arterial blood gas, ED     Status: Abnormal   Collection Time: 08/10/23  7:22 PM  Result Value Ref Range   pH, Arterial 7.221 (L) 7.35 - 7.45   pCO2 arterial 76.3 (HH) 32 - 48 mmHg   pO2, Arterial 191 (H) 83 - 108 mmHg   Bicarbonate 31.3 (H) 20.0 - 28.0 mmol/L   TCO2 34 (H) 22 - 32 mmol/L   O2 Saturation 99 %   Acid-Base Excess 2.0 0.0 - 2.0 mmol/L   Sodium 134 (L) 135 - 145 mmol/L   Potassium 5.5 (H) 3.5 -  5.1 mmol/L   Calcium, Ion 1.10 (L) 1.15 - 1.40 mmol/L   HCT 32.0 (L) 39.0 - 52.0 %   Hemoglobin 10.9 (L) 13.0 - 17.0 g/dL   Patient temperature 60.4 F    Collection site RADIAL, ALLEN'S TEST ACCEPTABLE    Drawn by RT    Sample type ARTERIAL    Comment NOTIFIED PHYSICIAN    DG Abd Portable 1V  Result Date: 08/10/2023 CLINICAL DATA:  OG tube placement EXAM: PORTABLE ABDOMEN - 1 VIEW COMPARISON:  None Available. FINDINGS: OG tube tip is in the proximal stomach with the side port in the distal esophagus. Nonobstructive bowel gas pattern. IMPRESSION: OG tube in the proximal stomach near the GE junction with the side port in the distal esophagus. Electronically Signed   By: Charlett Nose M.D.   On: 08/10/2023 19:31   DG Chest Portable 1 View  Result Date: 08/10/2023 CLINICAL DATA:  Post intubation EXAM: PORTABLE CHEST 1 VIEW COMPARISON:  08/10/2023 FINDINGS: Endotracheal tube is 7 cm above the carina. NG tube enters the stomach. Heart and mediastinal contours are within normal limits. Consolidation in the right lower lobe. No confluent opacity on the left. No effusions or acute bony abnormality. IMPRESSION: Endotracheal tube 7 cm above the carina. Right lower lobe atelectasis or pneumonia. Electronically Signed   By: Charlett Nose M.D.   On: 08/10/2023 19:30   CT HEAD WO CONTRAST ( )  Result Date: 08/10/2023 CLINICAL DATA:  Mental status change, unknown cause EXAM: CT HEAD WITHOUT CONTRAST TECHNIQUE: Contiguous axial images were obtained from the base of the skull through the vertex without intravenous contrast. RADIATION DOSE REDUCTION: This exam was performed according to the departmental dose-optimization program which includes automated exposure control, adjustment of the mA and/or kV according to patient size and/or use of iterative reconstruction technique. COMPARISON:  None Available. FINDINGS: Brain: There is atrophy and chronic small vessel disease changes. No acute intracranial abnormality.  Specifically, no hemorrhage, hydrocephalus, mass lesion, acute infarction, or significant intracranial injury. Vascular: No hyperdense vessel or unexpected calcification. Skull: No acute calvarial abnormality. Sinuses/Orbits: No acute findings Other: None IMPRESSION: Atrophy, chronic microvascular disease. No acute intracranial abnormality. Electronically Signed   By: Charlett Nose M.D.   On: 08/10/2023 18:56   DG Chest Portable 1 View  Result Date: 08/10/2023 CLINICAL DATA:  Shortness of breath. EXAM: PORTABLE CHEST 1 VIEW COMPARISON:  Chest radiograph dated 10/24/2022. FINDINGS: There is diffuse chronic interstitial coarsening. There is mild cardiomegaly with vascular congestion. No focal consolidation, pleural effusion, or pneumothorax. No acute osseous pathology. IMPRESSION: Mild cardiomegaly with vascular congestion. Electronically Signed   By: Ceasar Mons.D.  On: 08/10/2023 16:25    Pending Labs Unresulted Labs (From admission, onward)     Start     Ordered   08/11/23 0500  CBC  Tomorrow morning,   R        08/10/23 1802   08/11/23 0500  Basic metabolic panel  Tomorrow morning,   R        08/10/23 1802   08/11/23 0500  Magnesium  Tomorrow morning,   R        08/10/23 1802   08/11/23 0500  Phosphorus  Tomorrow morning,   R        08/10/23 1802   08/10/23 2000  Blood gas, arterial  Once,   R        08/10/23 1802   08/10/23 1931  MRSA Next Gen by PCR, Nasal  Once,   R        08/10/23 1930   08/10/23 1928  Draw ABG 1 hour after initiation of ventilator  Once,   R        08/10/23 1928   08/10/23 1830  Hepatitis B surface antibody,quantitative  Once,   R        08/10/23 1830   08/10/23 1816  Urine Culture (for pregnant, neutropenic or urologic patients or patients with an indwelling urinary catheter)  (Urine Labs)  Once,   R       Question:  Indication  Answer:  Sepsis   08/10/23 1815   08/10/23 1802  Hemoglobin A1c  (Glycemic Control (SSI)  Q 4 Hours / Glycemic Control (SSI)  AC  +/- HS)  Once,   R       Comments: To assess prior glycemic control    08/10/23 1802   08/10/23 1752  Hepatitis B surface antigen  (New Admission Hemo Labs (Hepatitis B))  Once,   URGENT        08/10/23 1752   08/10/23 1713  Blood culture (routine x 2)  BLOOD CULTURE X 2,   R (with STAT occurrences)      08/10/23 1712   08/10/23 1713  Lactic acid, plasma  (Lactic Acid)  Now then every 2 hours,   R (with STAT occurrences)      08/10/23 1712   08/10/23 1559  Blood gas, venous  Once,   R        08/10/23 1558   08/10/23 1559  Brain natriuretic peptide  Once,   URGENT        08/10/23 1558   Signed and Held  Renal function panel  Once,   R        Signed and Held   Signed and Held  CBC  Once,   R        Signed and Held            Vitals/Pain Today's Vitals   08/10/23 1925 08/10/23 1930 08/10/23 1935 08/10/23 1940  BP: (!) 118/58 123/66 (!) 133/59 132/62  Pulse: 84 84 81 78  Resp: (!) 22 (!) 28 (!) 28 (!) 28  Temp:      TempSrc:      SpO2: 100% 100% 100% 100%  Height:        Isolation Precautions No active isolations  Medications Medications  Chlorhexidine Gluconate Cloth 2 % PADS 6 each (has no administration in time range)  heparin injection 5,000 Units (has no administration in time range)  insulin aspart (novoLOG) injection 0-9 Units (has no administration in time range)  budesonide (PULMICORT)  nebulizer solution 0.5 mg (has no administration in time range)  arformoterol (BROVANA) nebulizer solution 15 mcg (has no administration in time range)  methylPREDNISolone sodium succinate (SOLU-MEDROL) 125 mg/2 mL injection 60 mg (has no administration in time range)  revefenacin (YUPELRI) nebulizer solution 175 mcg (has no administration in time range)  ketamine 50 mg in normal saline 5 mL (10 mg/mL) syringe (100 mg Intravenous Not Given 08/10/23 1916)  vancomycin (VANCOREADY) IVPB 1500 mg/300 mL (has no administration in time range)  ketamine (KETALAR) injection (100 mg  Intravenous Given 08/10/23 1849)  rocuronium (ZEMURON) injection (100 mg Intravenous Given 08/10/23 1850)  metroNIDAZOLE (FLAGYL) IVPB 500 mg (has no administration in time range)  ceFEPIme (MAXIPIME) 1 g in sodium chloride 0.9 % 100 mL IVPB (1 g Intravenous New Bag/Given 08/10/23 1941)  propofol (DIPRIVAN) 1000 MG/100ML infusion (10 mcg/kg/min  75 kg (Order-Specific) Intravenous New Bag/Given 08/10/23 1917)  0.9 %  sodium chloride infusion (0 mLs Intravenous Stopped 08/10/23 1937)  norepinephrine (LEVOPHED) 4mg  in (0.016 mg/mL) premix infusion (has no administration in time range)  famotidine (PEPCID) tablet 20 mg (has no administration in time range)  docusate (COLACE) 50 MG/5ML liquid 100 mg (has no administration in time range)  polyethylene glycol (MIRALAX / GLYCOLAX) packet 17 g (has no administration in time range)  fentaNYL (SUBLIMAZE) injection 25 mcg (has no administration in time range)  fentaNYL (SUBLIMAZE) injection 25-100 mcg (has no administration in time range)  methylPREDNISolone sodium succinate (SOLU-MEDROL) 125 mg/2 mL injection 125 mg (125 mg Intravenous Given 08/10/23 1655)  ipratropium-albuterol (DUONEB) 0.5-2.5 (3) MG/3ML nebulizer solution 3 mL (3 mLs Nebulization Given 08/10/23 1835)  albuterol (PROVENTIL) (2.5 MG/3ML) 0.083% nebulizer solution 10 mg (10 mg Nebulization Given 08/10/23 1656)  magnesium sulfate IVPB 2 g 50 mL (0 g Intravenous Stopped 08/10/23 1816)  ipratropium (ATROVENT) nebulizer solution 1 mg (1 mg Nebulization Given 08/10/23 1835)  cefTRIAXone (ROCEPHIN) 2 g in sodium chloride 0.9 % 100 mL IVPB (0 g Intravenous Stopped 08/10/23 1825)  azithromycin (ZITHROMAX) 500 mg in sodium chloride 0.9 % 250 mL IVPB (0 mg Intravenous Stopped 08/10/23 1932)  albuterol (PROVENTIL) (2.5 MG/3ML) 0.083% nebulizer solution 15 mg (15 mg Nebulization Given 08/10/23 1744)    Mobility non-ambulatory     Focused Assessments Cardiac Assessment Handoff:    Lab Results   Component Value Date   TROPONINI <0.30 07/30/2013   No results found for: "DDIMER" Does the Patient currently have chest pain? No    R Recommendations: See Admitting Provider Note  Report given to:   Additional Notes:

## 2023-08-11 DIAGNOSIS — J9601 Acute respiratory failure with hypoxia: Secondary | ICD-10-CM | POA: Diagnosis not present

## 2023-08-11 DIAGNOSIS — J9602 Acute respiratory failure with hypercapnia: Secondary | ICD-10-CM

## 2023-08-11 LAB — MAGNESIUM
Magnesium: 2.5 mg/dL — ABNORMAL HIGH (ref 1.7–2.4)
Magnesium: 1.9 mg/dL (ref 1.7–2.4)

## 2023-08-11 LAB — RESPIRATORY PANEL BY PCR

## 2023-08-11 LAB — CBC
HCT: 30.8 % — ABNORMAL LOW (ref 39.0–52.0)
Hemoglobin: 10.2 g/dL — ABNORMAL LOW (ref 13.0–17.0)
MCH: 32.3 pg (ref 26.0–34.0)
MCHC: 33.1 g/dL (ref 30.0–36.0)
MCV: 97.5 fL (ref 80.0–100.0)
Platelets: 251 10*3/uL (ref 150–400)
RBC: 3.16 MIL/uL — ABNORMAL LOW (ref 4.22–5.81)
RDW: 14.2 % (ref 11.5–15.5)
WBC: 6.4 10*3/uL (ref 4.0–10.5)
nRBC: 0 % (ref 0.0–0.2)

## 2023-08-11 LAB — BRAIN NATRIURETIC PEPTIDE
B Natriuretic Peptide: 3575.8 pg/mL — ABNORMAL HIGH (ref 0.0–100.0)
B Natriuretic Peptide: 1932.2 pg/mL — ABNORMAL HIGH (ref 0.0–100.0)

## 2023-08-11 LAB — BASIC METABOLIC PANEL
Anion gap: 16 — ABNORMAL HIGH (ref 5–15)
BUN: 44 mg/dL — ABNORMAL HIGH (ref 8–23)
CO2: 22 mmol/L (ref 22–32)
Calcium: 8.5 mg/dL — ABNORMAL LOW (ref 8.9–10.3)
Chloride: 96 mmol/L — ABNORMAL LOW (ref 98–111)
Creatinine, Ser: 6.08 mg/dL — ABNORMAL HIGH (ref 0.61–1.24)
GFR, Estimated: 9 mL/min — ABNORMAL LOW (ref >60)
Glucose, Bld: 126 mg/dL — ABNORMAL HIGH (ref 70–99)
Potassium: 4.7 mmol/L (ref 3.5–5.1)
Sodium: 134 mmol/L — ABNORMAL LOW (ref 135–145)

## 2023-08-11 LAB — TROPONIN I (HIGH SENSITIVITY)
Troponin I (High Sensitivity): 269 ng/L (ref <18)
Troponin I (High Sensitivity): 235 ng/L (ref <18)

## 2023-08-11 LAB — HEPATITIS B SURFACE ANTIGEN: Hepatitis B Surface Ag: NONREACTIVE

## 2023-08-11 LAB — GLUCOSE, CAPILLARY
Glucose-Capillary: 108 mg/dL — ABNORMAL HIGH (ref 70–99)
Glucose-Capillary: 109 mg/dL — ABNORMAL HIGH (ref 70–99)
Glucose-Capillary: 112 mg/dL — ABNORMAL HIGH (ref 70–99)
Glucose-Capillary: 123 mg/dL — ABNORMAL HIGH (ref 70–99)
Glucose-Capillary: 124 mg/dL — ABNORMAL HIGH (ref 70–99)
Glucose-Capillary: 126 mg/dL — ABNORMAL HIGH (ref 70–99)

## 2023-08-11 LAB — LACTIC ACID, PLASMA
Lactic Acid, Venous: 2.1 mmol/L (ref 0.5–1.9)
Lactic Acid, Venous: 1.3 mmol/L (ref 0.5–1.9)
Lactic Acid, Venous: 1.2 mmol/L (ref 0.5–1.9)

## 2023-08-11 LAB — HEMOGLOBIN A1C
Hgb A1c MFr Bld: 5.5 % (ref 4.8–5.6)
Mean Plasma Glucose: 111.15 mg/dL

## 2023-08-11 LAB — PHOSPHORUS
Phosphorus: 5.2 mg/dL — ABNORMAL HIGH (ref 2.5–4.6)
Phosphorus: 5.2 mg/dL — ABNORMAL HIGH (ref 2.5–4.6)

## 2023-08-11 MED ORDER — VANCOMYCIN HCL 750 MG/150ML IV SOLN
750.0000 mg | INTRAVENOUS | Status: AC
  Administered 2023-08-11: 750 mg via INTRAVENOUS
  Filled 2023-08-11: qty 150

## 2023-08-11 MED ORDER — ALTEPLASE 2 MG IJ SOLR: 2.0000 mg | Freq: Once | INTRAMUSCULAR | Status: DC | PRN

## 2023-08-11 MED ORDER — ANTICOAGULANT SODIUM CITRATE 4% (200MG/5ML) IV SOLN: 5.0000 mL | Status: DC | PRN

## 2023-08-11 MED ORDER — HEPARIN SODIUM (PORCINE) 1000 UNIT/ML DIALYSIS: 2500.0000 [IU] | Freq: Once | INTRAMUSCULAR | Status: DC

## 2023-08-11 MED ORDER — HEPARIN SODIUM (PORCINE) 1000 UNIT/ML DIALYSIS
1000.0000 [IU] | INTRAMUSCULAR | Status: DC | PRN
  Filled 2023-08-11: qty 1

## 2023-08-11 MED ORDER — PENTAFLUOROPROP-TETRAFLUOROETH EX AERO: 1.0000 | INHALATION_SPRAY | CUTANEOUS | Status: DC | PRN

## 2023-08-11 MED ORDER — VITAL 1.5 CAL PO LIQD
1000.0000 mL | ORAL | Status: DC
  Administered 2023-08-11: 1000 mL

## 2023-08-11 MED ORDER — LIDOCAINE-PRILOCAINE 2.5-2.5 % EX CREA: 1.0000 | TOPICAL_CREAM | CUTANEOUS | Status: DC | PRN

## 2023-08-11 MED ORDER — VANCOMYCIN VARIABLE DOSE PER UNSTABLE RENAL FUNCTION (PHARMACIST DOSING): Status: DC

## 2023-08-11 MED ORDER — THIAMINE MONONITRATE 100 MG PO TABS
100.0000 mg | ORAL_TABLET | Freq: Every day | ORAL | Status: DC
  Administered 2023-08-11 – 2023-08-12 (×2): 100 mg
  Filled 2023-08-11 (×2): qty 1

## 2023-08-11 MED ORDER — FAMOTIDINE 20 MG PO TABS
20.0000 mg | ORAL_TABLET | Freq: Every day | ORAL | Status: DC
  Administered 2023-08-12: 20 mg
  Filled 2023-08-11: qty 1

## 2023-08-11 MED ORDER — PROSOURCE TF20 ENFIT COMPATIBL EN LIQD
60.0000 mL | Freq: Every day | ENTERAL | Status: DC
  Administered 2023-08-11 – 2023-08-12 (×2): 60 mL
  Filled 2023-08-11 (×2): qty 60

## 2023-08-11 MED ORDER — LIDOCAINE HCL (PF) 1 % IJ SOLN: 5.0000 mL | INTRAMUSCULAR | Status: DC | PRN

## 2023-08-11 NOTE — Progress Notes (Signed)
Pharmacy Antibiotic Note  Jeremy [name] is a 82 y.o. male for which pharmacy has been consulted for cefepime and vancomycin dosing for sepsis.  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 Patient presenting with SOB.  WBC 6.4; afebrile Receiving iHD on 9/30 AM  Plan: Give vancomycin 750mg  IV x1 today after HD Continue cefepime 1g IV q24h  Continue metronidazole 500mg  IV q12h per MD Monitor WBC, fever, renal function, cultures De-escalate when able F/u Nephrology plan  Height: 5\' 10"  (177.8 cm) Weight: 73.9 kg (162 lb 14.7 oz) IBW/kg (Calculated) : 73  Temp (24hrs), Avg:98.2 F (36.8 C), Min:97.7 F (36.5 C), Max:98.6 F (37 C)  Recent Labs  Lab 08/10/23 1559 08/10/23 1713 08/10/23 2326 08/11/23 0244 08/11/23 0841  WBC 6.6  --   --  6.4  --   CREATININE 5.65*  --   --  6.08*  --   LATICACIDVEN  --  0.8 2.1*  --  1.3    Estimated Creatinine Clearance: 9.5 mL/min (A) (by C-G formula based on SCr of 6.08 mg/dL (H)).    Allergies  Allergen Reactions   Sulfa Antibiotics     Rash, light headed, shaking   Penicillins Other (See Comments)    hives severe as a child    Tape     Pulls skin off   Antibiotics this admission: Ceftriaxone 2g IV x1 9/29 Azithromycin 500mg  IV x1 9/29 Cefepime 9/29 >>  Metronidazole 9/29 >>  Vancomycin 9/29 >>    Microbiology: 9/29 Bld cx x2: NG <12h 9/29 Ur Cx: pending  Thank you for allowing pharmacy to be a part of this patient's care.  Wilburn Cornelia, PharmD, BCPS Clinical Pharmacist 08/11/2023 12:36 PM   Please refer to [facility] for pharmacy phone number

## 2023-08-11 NOTE — Plan of Care (Signed)
  Problem: Clinical Measurements: Goal: Will remain free from infection Outcome: Progressing   Problem: Elimination: Goal: Will not experience complications related to urinary retention Outcome: Progressing   Problem: Pain Managment: Goal: General experience of comfort will improve Outcome: Progressing   Problem: Skin Integrity: Goal: Risk for impaired skin integrity will decrease Outcome: Progressing

## 2023-08-11 NOTE — Progress Notes (Signed)
Heart Failure Navigator Progress Note  Assessed for Heart & Vascular TOC clinic readiness.  Patient does not meet criteria due to ESRD on hemodialysis.   Navigator will sign off at this time.    Dawn Fields, BSN, RN Heart Failure Nurse Navigator Secure Chat Only   

## 2023-08-11 NOTE — Progress Notes (Addendum)
NAME:  Jeremy [name], MRN:  [id number], DOB:  12-Feb-1941, LOS: 1 ADMISSION DATE:  08/10/2023, CONSULTATION DATE:  08/10/23 REFERRING MD:  Eloise Harman- ED , CHIEF COMPLAINT:  hypercapnic respiratory failure   History of Present Illness:  Mr. Villaneda is an 82 y/o gentleman with a history of ESRD on HD TThS who presented to the hospital for being somnolent today. His wife could barely wake him up earlier and decided to bring him to the ED. When she checked his pulse ox at home he had saturations in the 50s (not on his oxygen). He only uses his oxygen intermittently because he was never given a prescription of how to use it. He was in his usual state of health yesterday after HD. He came home after completing treatment and went to sleep as he usually dose. He completed his treatment for the prescribed time. They have been cutting down his sessions duration due to meeting his lab goals; he had requested to only have HD twice per week, so this was a compromise.   He has a chronic cough and his wife indicates she has noticed him coughing a little more than his usual the past 2 nights. He has a chronic productive cough x many years. He is still smoking 1ppd x ~65 years. He has not complained of pain or any new symptoms. She is worried that he could have a UTI due to symptoms he has had int he past when he had a UTI. He self-catheterizes since he still makes some urine; this morning he got a small amount out. He is obtunded in the ED and not able to provide additional history. All history provided by his wife at bedside. In the ED he was found to be hypercapnic and was placed on bipap.   Per his wife he was playing golf three times per week in early 2024, but has declined since then. He was able to play 14 holes one day last week. He has had intermittent confusion and labile BPs over the past few months, and he no longer drives. He usually sleeps after HD on those days, but is up and around the house the other days of  the week.  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 Wife at bedside. No acute events overnight  Objective   Blood pressure (!) 142/99, pulse 67, temperature 98.4 F (36.9 C), temperature source Axillary, resp. rate (!) 28, height 5\' 10"  (1.778 m), weight 73.9 kg, SpO2 96%.    Vent Mode: PRVC FiO2 (%):  [60 %-100 %] 60 % Set Rate:  [22 bmp-28 bmp] 28 bmp Vt Set:  [580 mL] 580 mL PEEP:  [5 cmH20-6 cmH20] 5 cmH20 Pressure Support:  [12 cmH20] 12 cmH20 Plateau Pressure:  [15 cmH20-18 cmH20] 18 cmH20   Intake/Output Summary (Last 24 hours) at 08/11/2023 0750 Last data filed at 08/11/2023 0400 Gross per 24 hour  Intake 633.76 ml  Output 110 ml  Net 523.76 ml   Filed Weights   08/11/23 0500  Weight: 73.9 kg   Examination: General: ill appearing man lying in bed in NAD, slightly arousable HENT: oral mucosa dry, bipap mask in place.  Otherwise normocephalic.  Lungs: no wheezing, tolerating BiPAP and breathing above the set rate. Getting Vt ~400-550cc Cardiovascular: S1S2, irreg rhtyhm with PVCs on tele.  Abdomen: soft, NT Extremities: no edema, muscle wasting Neuro: requires painful stimulation to arouse him, will respond and follow simple commands to his wife's voice, but not to others in the room Derm: some bruising, dry, no diffuse rashes.  Resolved Hospital Problem list   Hyperkalemia  Assessment & Plan:  Shock - continue vasopressor support for MAP 65 or greater - Etiology likely due to severe acidosis, unsure if infection present - Follow up cultures, continue broad antibiotic coverage - UA with few bacteria  and moderate leukocytes  Acute encephalopathy -head CT is negative for acute pathology - hypercapnia improved with bipap -Con't BiPAP, repeat ABG in 1-2 hours. Needs very close monitoring in ICU. High risk for intubation discussed with wife> she is very clear that she expects him to make a full recovery after this admission and be discharged back home.  -empiric sepsis treatment after antibiotics-- starting vanc and zosyn -straight cath for UA, culture -NPO  Acute on chronic respiratory failure with hypercapnia and hypoxia OSA, not on CPAP Concern for acute pulmonary edema> concern he may now be getting under- dialyzed due to changed HD regimen. -Continue mechanical ventilatory support - Wean sedation for PSV trial -Continue bronchodilators due to suspicion for COPD -Continue solumedrol 60mg  IV BID - Nephrology following  ESRD AGMA-- has a historyof this in previous admissions with normal bicarb-- presumably due to renal failure - last HD on 9/28. LUE fistula.  -nephrology consult for iHD needs -renally dose meds, avoid nephrotoxic meds -hold renvela until eating  Mild troponin elevation; suspect this is due to critical illness, although he has increased risk for CAD with smoking, ESRD, age, male gender. EKG concerning for inferolateral ischemia potentially. -trend and repeat EKG this AM -unable to take oral meds; but can start aspirin and statin this admission  History of hypertension -hold metoprolol for now  Anemia- chronic, likely due to renal failure -monitor for bleeding -transfuse for Hb<7 or hemodynamically significant bleeding  Deconditioning -will need PT & OT; has been declining significantly over the past 10 months per wife   Best Practice (right click and "Reselect all SmartList Selections" daily)   Diet/type: NPO DVT prophylaxis: prophylactic heparin  GI prophylaxis: N/A Lines: N/A Foley:  N/A Code Status:  full code Last date of multidisciplinary goals  of care discussion [wife updated at bedside 9/29- wants all aggressive care measures]  Labs   CBC: Recent Labs  Lab 08/10/23 1559 08/10/23 1611 08/10/23 1922 08/11/23 0244  WBC 6.6  --   --  6.4  HGB 10.8* 11.9* 10.9* 10.2*  HCT 34.7* 35.0* 32.0* 30.8*  MCV 102.7*  --   --  97.5  PLT 268  --   --  251    Basic Metabolic Panel: Recent Labs  Lab 08/10/23 1559 08/10/23 1611 08/10/23 1922 08/11/23 0244  NA 137 135 134* 134*  K 5.3* 5.3* 5.5* 4.7  CL 90*  --   --  96*  CO2 30  --   --  22  GLUCOSE 102*  --   --  126*  BUN 32*  --   --  44*  CREATININE 5.65*  --   --  6.08*  CALCIUM 8.6*  --   --  8.5*  MG  --   --   --  2.5*  PHOS  --   --   --  5.2*   GFR: Estimated Creatinine Clearance: 9.5 mL/min (A) (by C-G formula based on SCr of 6.08 mg/dL (H)). Recent Labs  Lab 08/10/23 1559 08/10/23 1713 08/10/23 2326 08/11/23 0244  WBC 6.6  --   --  6.4  LATICACIDVEN  --  0.8 2.1*  --     Liver Function Tests: Recent Labs  Lab 08/10/23 1559  AST 13*  ALT 10  ALKPHOS 65  BILITOT 0.6  PROT 7.2  ALBUMIN 3.3*   No results for input(s): "LIPASE", "AMYLASE" in the last 168 hours. No results for input(s): "AMMONIA" in the last 168 hours.  ABG    Component Value Date/Time   PHART 7.221 (L) 08/10/2023 1922   PCO2ART 76.3 (HH) 08/10/2023 1922   PO2ART 191 (H) 08/10/2023 1922   HCO3 24.0 08/10/2023 2324   TCO2 34 (H) 08/10/2023 1922   ACIDBASEDEF 0.2 08/10/2023 2324   O2SAT 92.6 08/10/2023 2324     Coagulation Profile: No results for input(s): "INR", "PROTIME" in the last 168 hours.  Cardiac Enzymes: No results for input(s): "CKTOTAL", "CKMB", "CKMBINDEX", "TROPONINI" in the last 168 hours.  HbA1C: Hgb A1c MFr Bld  Date/Time Value Ref Range Status  08/10/2023 11:26 PM 5.5 4.8 - 5.6 % Final    Comment:    (NOTE) Pre diabetes:          5.7%-6.4%  Diabetes:              >6.4%  Glycemic control for   <7.0% adults with diabetes   07/29/2013 05:07 PM 5.6  <5.7 % Final    Comment:    (NOTE)                                                                       According to the ADA Clinical Practice Recommendations for 2011, when HbA1c is used as a screening test:  >=6.5%   Diagnostic of Diabetes Mellitus           (if abnormal result is confirmed) 5.7-6.4%   Increased risk of developing Diabetes Mellitus References:Diagnosis and Classification of Diabetes Mellitus,Diabetes Care,2011,34(Suppl 1):S62-S69 and Standards of Medical Care in         Diabetes - 2011,Diabetes Care,2011,34 (Suppl 1):S11-S61.    CBG: Recent Labs  Lab 08/10/23 2001 08/10/23 2213 08/10/23 2335 08/11/23 0316  GLUCAP 129* 159* 145* 123*     Critical care time: 35 min.     Melody Comas, MD [facility] Office: [phone]   See Amion for personal pager PCCM on call pager [phone] until 7pm. Please call Elink 7p-7a. [phone]

## 2023-08-11 NOTE — Progress Notes (Signed)
   08/11/23 1300  Vitals  Temp 98 F (36.7 C)  Temp Source Axillary  BP Location Right Arm  BP Method Automatic  Patient Position (if appropriate) Lying  Oxygen Therapy  O2 Device Ventilator  During Treatment Monitoring  Intra-Hemodialysis Comments Tx completed  Post Treatment  Dialyzer Clearance Heavily streaked  Hemodialysis Intake (mL) 0 mL  Liters Processed 66  Fluid Removed (mL) 2300 mL  Tolerated HD Treatment Yes  Post-Hemodialysis Comments Pt goal met.  AVG/AVF Arterial Site Held (minutes) 7 minutes  AVG/AVF Venous Site Held (minutes) 7 minutes

## 2023-08-11 NOTE — Progress Notes (Signed)
Initial Nutrition Assessment  DOCUMENTATION CODES:   Non-severe (moderate) malnutrition in context of chronic illness  INTERVENTION:   Abdominal x-ray from 9/29 shows OG tube in proximal stomach and side port in distal esophagus. Unclear whether this was advanced as there was no follow-up x-ray obtained. Have reached out to RN regarding this. Prior to initiating enteral nutrition, recommend obtaining additional imaging to confirm gastric placement.  Once OG tube confirmed to be gastric, initiate enteral nutrition: - Start Vital 1.5 @ 20 ml/hr and advance rate by 10 ml every 8 hours to goal rate of 50 ml/hr (1200 ml/day) - PROSource TF20 60 ml daily  Tube feeding regimen at goal rate provides 1880 kcal, 101 grams of protein, and 917 ml of H2O.  Monitor magnesium, potassium, and phosphorus every 12 hours for at least 6 occurrences. MD to replete as needed as pt is at risk for refeeding syndrome given malnutrition.   - Recommend thiamine 100 mg daily x 5 days due to refeeding risk  NUTRITION DIAGNOSIS:   Moderate Malnutrition related to chronic illness (ESRD/HD, COPD) as evidenced by moderate fat depletion, severe muscle depletion.  GOAL:   Patient will meet greater than or equal to 90% of their needs  MONITOR:   Vent status, Labs, Weight trends, TF tolerance, I & O's  REASON FOR ASSESSMENT:   Ventilator, Consult Enteral/tube feeding initiation and management  ASSESSMENT:   82 year old male who presented to the ED on 9/20 with SOB. 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 Pt admitted with acute encephalopathy, shock.  9/29 - intubated after trial on BiPAP  Discussed pt with RN and MD. Pt received iHD today at bedside with 2300 ml net UF.  Consult received for enteral nutrition initiation and management. Pt with OG tube in proximal stomach  and side port in distal esophagus per abdominal x-ray yesterday. Reached out to RN via secure chat to see if OG tube was ever advanced; awaiting response.  Unable to obtain diet and weight history at this time. Reviewed available weight history in chart. Pt's weight has fluctuated between 68-75.6 kg over the last 1 year with no true trends noted. Suspect weight fluctuation is due in part to volume status given pt is on HD. Based on NFPE, pt meets criteria for moderate malnutrition.  Pt at risk for refeeding syndrome. Will place order to start tube feeds at trickle rate and slowly advance to goal.  EDW: 73.3 kg Admit weight: 73.9 kg Current weight: 71.7 kg  Patient is currently intubated on ventilator support Temp (24hrs), Avg:98.2 F (36.8 C), Min:97.7 F (36.5 C), Max:98.6 F (37 C) BP (cuff): 112/63 MAP (cuff): 70  Drips: Propofol: 15 mcg/kg/min (provides 178 kcal daily from lipid) Fentanyl Levophed: 2 mcg/min  Medications reviewed and include: colace, pepcid, SSI every 4 hours, IV solu-medrol, miralax, IV abx  Labs reviewed: sodium 134, chloride 96, phosphorus 5.2, magnesium 2.5, ionized calcium 1.10 CBG's: 108-159 x 24 hours  I/O's: -1.7 L since admit  NUTRITION - FOCUSED PHYSICAL EXAM:  Flowsheet Row Most Recent Value  Orbital Region Moderate depletion  Upper Arm Region Moderate depletion  Thoracic and Lumbar Region Moderate depletion  Buccal Region Unable to assess  Temple Region Moderate depletion  Clavicle Bone Region Severe depletion  Clavicle and Acromion Bone Region Severe depletion  Scapular Bone Region Unable to assess  Dorsal Hand Moderate depletion  Patellar Region Severe depletion  Anterior Thigh Region Severe depletion  Posterior Calf Region Severe depletion  Edema (RD Assessment) None  Hair Reviewed  Eyes Reviewed  Mouth Reviewed  Skin Reviewed  Nails Reviewed    Diet Order:   Diet Order             Diet NPO time specified  Diet effective now                    EDUCATION NEEDS:   Not appropriate for education at this time  Skin:  Skin Assessment: Reviewed RN Assessment (skin tear L shoulder)  Last BM:  08/11/23  Height:   Ht Readings from Last 1 Encounters:  08/10/23 5\' 10"  (1.778 m)    Weight:   Wt Readings from Last 1 Encounters:  08/11/23 71.7 kg    Ideal Body Weight:  75.5 kg  BMI:  Body mass index is 22.68 kg/m.  Estimated Nutritional Needs:   Kcal:  1800-2000  Protein:  90-110 grams  Fluid:  1000 ml + UOP    Mertie Clause, MS, RD, LDN Registered Dietitian II Please see AMiON for contact information.

## 2023-08-11 NOTE — Progress Notes (Signed)
[facility] Progress Note  Subjective: on iHD this am. On levo gtt. BP's down in HD.   Vitals:   08/11/23 1115 08/11/23 1130 08/11/23 1145 08/11/23 1200  BP: 101/66 126/70 111/70 (!) 123/58  Pulse: (!) 58 65 60 (!) 49  Resp: (!) 23 17 (!) 27 (!) 28  Temp:      TempSrc:      SpO2: 95% 99% 98% 98%  Weight:      Height:        Exam: On vent, sedated No jvd Chest - CTA bilat Cor reg no mrg Abd soft, ntnd  Ext no edema UE/ LE"s   LUA AVF+ bruit     OP HD: [facility] T[facility] 3hr (recently decreased) 3h   73.3kg   400/600  2/2 bath  Heparin 2500  LUA AVF Hectorol 2  Last Mircera 7/2    9/29 CXR - R lower lung atx vs pna, no edema   Assessment/Plan: ESRD - rapid decline in renal function with negative serologic w/u eventually started on dialysis and is currently at [facility] TTS. Some fluid on CXR (no rales) and SOB likely more from COPD; he has received albuterol. He left at his EDW on Saturday despite a shortened treatment (2hr 55). Plan is for iHD today in ICU. Get vol down further if possible (BP's are low).   Volume - not vol overloaded on exam. Hypoxic though so we are trying to get some fluid off.  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 HTN - resume home regimen. 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 Anemia of chronic disease - last received Mircera 60 on 7/2 and Hb was on 10.7 on 9/26. Chronic resp failure - due to OSA + COPD     Rob Arlean Hopping MD  [facility] 08/11/2023, 12:10 PM  Recent Labs  Lab 08/10/23 1559 08/10/23 1611 08/10/23 1922 08/11/23 0244  HGB 10.8*   < > 10.9* 10.2*  ALBUMIN 3.3*  --   --   --   CALCIUM 8.6*  --   --  8.5*  PHOS  --   --   --  5.2*  CREATININE 5.65*  --   --  6.08*  K 5.3*   < > 5.5* 4.7   < > = values in this interval not displayed.   No results for input(s): "IRON", "TIBC", "FERRITIN" in the last 168  hours. Inpatient medications:  arformoterol  15 mcg Nebulization BID   budesonide (PULMICORT) nebulizer solution  0.5 mg Nebulization BID   Chlorhexidine Gluconate Cloth  6 each Topical Q0600   docusate  100 mg Per Tube BID   famotidine  20 mg Per Tube BID   heparin  2,500 Units Dialysis Once in dialysis   heparin  5,000 Units Subcutaneous Q8H   insulin aspart  0-9 Units Subcutaneous Q4H   methylPREDNISolone (SOLU-MEDROL) injection  60 mg Intravenous Q12H   mouth rinse  15 mL Mouth Rinse Q2H   polyethylene glycol  17 g Per Tube Daily   revefenacin  175 mcg Nebulization Daily    sodium chloride Stopped (08/10/23 1937)   anticoagulant sodium citrate     ceFEPime (MAXIPIME) IV Stopped (08/10/23 2009)   fentaNYL infusion INTRAVENOUS 100 mcg/hr (08/11/23 0800)   metronidazole 500 mg (08/11/23 0824)   norepinephrine (LEVOPHED) Adult infusion 2 mcg/min (08/11/23 0923)   propofol (DIPRIVAN) infusion 15  mcg/kg/min (08/11/23 0800)   vancomycin     alteplase, anticoagulant sodium citrate, fentaNYL, heparin, lidocaine (PF), lidocaine-prilocaine, mouth rinse, pentafluoroprop-tetrafluoroeth

## 2023-08-12 DIAGNOSIS — Z992 Dependence on renal dialysis: Secondary | ICD-10-CM | POA: Diagnosis not present

## 2023-08-12 DIAGNOSIS — N186 End stage renal disease: Secondary | ICD-10-CM | POA: Diagnosis not present

## 2023-08-12 DIAGNOSIS — R0902 Hypoxemia: Secondary | ICD-10-CM

## 2023-08-12 DIAGNOSIS — I129 Hypertensive chronic kidney disease with stage 1 through stage 4 chronic kidney disease, or unspecified chronic kidney disease: Secondary | ICD-10-CM | POA: Diagnosis not present

## 2023-08-12 DIAGNOSIS — J9601 Acute respiratory failure with hypoxia: Secondary | ICD-10-CM | POA: Diagnosis not present

## 2023-08-12 DIAGNOSIS — J9611 Chronic respiratory failure with hypoxia: Secondary | ICD-10-CM

## 2023-08-12 DIAGNOSIS — J9622 Acute and chronic respiratory failure with hypercapnia: Secondary | ICD-10-CM

## 2023-08-12 LAB — GLUCOSE, CAPILLARY
Glucose-Capillary: 121 mg/dL — ABNORMAL HIGH (ref 70–99)
Glucose-Capillary: 126 mg/dL — ABNORMAL HIGH (ref 70–99)
Glucose-Capillary: 131 mg/dL — ABNORMAL HIGH (ref 70–99)
Glucose-Capillary: 134 mg/dL — ABNORMAL HIGH (ref 70–99)
Glucose-Capillary: 135 mg/dL — ABNORMAL HIGH (ref 70–99)
Glucose-Capillary: 144 mg/dL — ABNORMAL HIGH (ref 70–99)

## 2023-08-12 LAB — CBC
HCT: 30.8 % — ABNORMAL LOW (ref 39.0–52.0)
Hemoglobin: 10 g/dL — ABNORMAL LOW (ref 13.0–17.0)
MCH: 31.4 pg (ref 26.0–34.0)
MCHC: 32.5 g/dL (ref 30.0–36.0)
MCV: 96.9 fL (ref 80.0–100.0)
Platelets: 256 10*3/uL (ref 150–400)
RBC: 3.18 MIL/uL — ABNORMAL LOW (ref 4.22–5.81)
RDW: 14.7 % (ref 11.5–15.5)
WBC: 10.6 10*3/uL — ABNORMAL HIGH (ref 4.0–10.5)
nRBC: 0 % (ref 0.0–0.2)

## 2023-08-12 LAB — BASIC METABOLIC PANEL
Anion gap: 19 — ABNORMAL HIGH (ref 5–15)
BUN: 50 mg/dL — ABNORMAL HIGH (ref 8–23)
CO2: 23 mmol/L (ref 22–32)
Calcium: 8.7 mg/dL — ABNORMAL LOW (ref 8.9–10.3)
Chloride: 91 mmol/L — ABNORMAL LOW (ref 98–111)
Creatinine, Ser: 5.13 mg/dL — ABNORMAL HIGH (ref 0.61–1.24)
GFR, Estimated: 10 mL/min — ABNORMAL LOW (ref >60)
Glucose, Bld: 133 mg/dL — ABNORMAL HIGH (ref 70–99)
Potassium: 5.1 mmol/L (ref 3.5–5.1)
Sodium: 133 mmol/L — ABNORMAL LOW (ref 135–145)

## 2023-08-12 LAB — MRSA NEXT GEN BY PCR, NASAL: MRSA by PCR Next Gen: NOT DETECTED

## 2023-08-12 LAB — MAGNESIUM
Magnesium: 2.2 mg/dL (ref 1.7–2.4)
Magnesium: 2.3 mg/dL (ref 1.7–2.4)

## 2023-08-12 LAB — PHOSPHORUS
Phosphorus: 6.5 mg/dL — ABNORMAL HIGH (ref 2.5–4.6)
Phosphorus: 7.4 mg/dL — ABNORMAL HIGH (ref 2.5–4.6)

## 2023-08-12 LAB — HEPATITIS B SURFACE ANTIBODY, QUANTITATIVE: Hep B S AB Quant (Post): <3.5 m[IU]/mL — ABNORMAL LOW

## 2023-08-12 MED ORDER — HYDRALAZINE HCL 20 MG/ML IJ SOLN: 10.0000 mg | INTRAMUSCULAR | Status: DC | PRN

## 2023-08-12 MED ORDER — THIAMINE MONONITRATE 100 MG PO TABS
100.0000 mg | ORAL_TABLET | Freq: Every day | ORAL | Status: AC
  Administered 2023-08-13 – 2023-08-15 (×3): 100 mg via ORAL
  Filled 2023-08-12 (×3): qty 1

## 2023-08-12 MED ORDER — METOPROLOL SUCCINATE ER 50 MG PO TB24
50.0000 mg | ORAL_TABLET | Freq: Every day | ORAL | Status: DC
  Administered 2023-08-12 – 2023-08-13 (×2): 50 mg via ORAL
  Filled 2023-08-12 (×2): qty 1

## 2023-08-12 MED ORDER — LABETALOL HCL 5 MG/ML IV SOLN
10.0000 mg | INTRAVENOUS | Status: DC | PRN
  Administered 2023-08-12 (×2): 10 mg via INTRAVENOUS
  Filled 2023-08-12 (×2): qty 4

## 2023-08-12 MED ORDER — ORAL CARE MOUTH RINSE
15.0000 mL | OROMUCOSAL | Status: DC
  Administered 2023-08-13 – 2023-08-18 (×20): 15 mL via OROMUCOSAL

## 2023-08-12 MED ORDER — POLYETHYLENE GLYCOL 3350 17 G PO PACK
17.0000 g | PACK | Freq: Every day | ORAL | Status: DC
  Administered 2023-08-13 – 2023-08-16 (×3): 17 g via ORAL
  Filled 2023-08-12 (×5): qty 1

## 2023-08-12 MED ORDER — CHLORHEXIDINE GLUCONATE CLOTH 2 % EX PADS
6.0000 | MEDICATED_PAD | Freq: Every day | CUTANEOUS | Status: DC
  Administered 2023-08-13: 6 via TOPICAL

## 2023-08-12 MED ORDER — ORAL CARE MOUTH RINSE: 15.0000 mL | OROMUCOSAL | Status: DC | PRN

## 2023-08-12 MED ORDER — SEVELAMER CARBONATE 800 MG PO TABS
1600.0000 mg | ORAL_TABLET | Freq: Three times a day (TID) | ORAL | Status: DC
  Administered 2023-08-12 – 2023-08-13 (×2): 1600 mg via ORAL
  Filled 2023-08-12 (×3): qty 2

## 2023-08-12 MED ORDER — DOCUSATE SODIUM 100 MG PO CAPS
100.0000 mg | ORAL_CAPSULE | Freq: Two times a day (BID) | ORAL | Status: DC
  Administered 2023-08-13: 100 mg via ORAL
  Filled 2023-08-12 (×2): qty 1

## 2023-08-12 MED ORDER — PANTOPRAZOLE SODIUM 40 MG IV SOLR
40.0000 mg | Freq: Once | INTRAVENOUS | Status: DC
Start: 2023-08-12 — End: 2023-08-12

## 2023-08-12 NOTE — Evaluation (Signed)
Clinical/Bedside Swallow Evaluation Patient Details  Name: Jeremy [name] MRN: [id number] Date of Birth: February 15, 1941  Today's Date: 08/12/2023 Time: SLP Start Time (ACUTE ONLY): 1014 SLP Stop Time (ACUTE ONLY): 1030 SLP Time Calculation (min) (ACUTE ONLY): 16 min  Past Medical History:  Past Medical History:  Diagnosis Date   Anemia    Cancer (HCC)    patient and family have not been told that he has cancer.   Chronic kidney disease    dialysis T/Th/Sa   COPD (chronic obstructive pulmonary disease) (HCC)    Enlarged prostate    History of blood transfusion    Hyperlipidemia    Hypertension    Self-catheterizes urinary bladder    pt. does this morning and evening--been doing this since 11/2016   Sleep apnea    not using a cpap machine   Past Surgical History:  Past Surgical History:  Procedure Laterality Date   A/V FISTULAGRAM Left 03/12/2023   Procedure: A/V Fistulagram;  Surgeon: Jeremy Sparrow, MD;  Location[facility] INVASIVE CV LAB;  Service: Cardiovascular;  Laterality: Left;   AV FISTULA PLACEMENT Left 10/16/2022   Procedure: LEFT BRACHIOCEPHALIC ARTERIOVENOUS (AV) FISTULA CREATION;  Surgeon: Jeremy Shelling, MD;  Location: [facility] OR;  Service: Vascular;  Laterality: Left;   BACK SURGERY     BLADDER TUMOR EXCISION  2015   benign   BLEPHAROPLASTY  12/2018   LIGATION OF COMPETING BRANCHES OF ARTERIOVENOUS FISTULA Left 03/17/2023   Procedure: LEFT ARM FISTULA BRANCH LIGATION;  Surgeon: Jeremy Sparrow, MD;  Location[facility] OR;  Service: Vascular;  Laterality: Left;   LUMBAR LAMINECTOMY/DECOMPRESSION MICRODISCECTOMY Bilateral 08/25/2017   Procedure: Bilateral Lumbar Three- Four, Lumbar Four- Five, Lumbar Five- Sacral One Laminectomy;  Surgeon: Jeremy Abu, MD;  Location: [facility] OR;  Service: Neurosurgery;  Laterality: Bilateral;  Bilateral L3-4 L4-5 L5-S1 Laminectomy   SKIN CANCER EXCISION     Surg x2...   TONSILLECTOMY AND ADENOIDECTOMY     Surg as a child...   TRANSURETHRAL RESECTION  OF BLADDER TUMOR N/A 01/03/2022   Procedure: TRANSURETHRAL RESECTION OF BLADDER TUMOR (TURBT)/ CYSTOSCOPY/ EXAM UNDER ANESTHESIA, BILATERAL RETROGRADE/ BLADDER BIOPSIES;  Surgeon: Jeremy Purpura, MD;  Location: [facility] ORS;  Service: Urology;  Laterality: N/A;  GENERAL ANESTHESIA WITH PARALYSIS   HPI:  [name] is an 82 y/o gentleman who presented to the hospital for being somnolent today. His wife could barely wake him up earlier and decided to bring him to the ED. When she checked his pulse ox at home he had saturations in the 50s (not on his oxygen). Pt required intubation 9/29-10/1. CXR 9/29: "Right lower lobe atelectasis or pneumonia." Pt with a history of smoking, ESRD on HD TThS.    Assessment / Plan / Recommendation  Clinical Impression  Pt presents with clinical indicators of pharyngeal dysphagia in setting of VDRF with 2 day intubation.  Pt with strong vocal quality on arrival, but intermittently wet with pt coughing and expectorating secretions.  SLP provided oral care prior to administration of PO trials.  Pt with wet vocal quality following thin liquids and coughing.  There were no overt s/s of aspiration with nectar thick liquid by cup or straw sip and vocal quality remained clear and strong.  Pt stated NTL felt "good" after swallow. Pt exhibited good oral clearance of puree and solids.  There was intermittent coughing seemingly related to secretions. Pt and wife report baseline coughing and expectorations of secretions frequently at home as well.  Pt does not have  hx of pneumonia.  Suspect symptoms are r/t recent intubation and hopefully will resolve quickly. Pt was extubated 1 hour prior to evaluation and pt was seen at request of RN.  Recommend initiating a modified PO diet at this time.  If symptoms persist tomorrow, will plan for instrumental swallow evaluation for further assessment of pharyngeal swallow function.  Pt, wife, and RN in agreement.    Recommend regular texture diet with nectar  thick liquids. Pt may have sips of water in between meals only, for comfort, in moderation, after good oral care, with upright positioning and supervision.   SLP Visit Diagnosis: Dysphagia, unspecified (R13.10)    Aspiration Risk  Mild aspiration risk    Diet Recommendation Regular;Nectar-thick liquid    Liquid Administration via: Cup;Straw Medication Administration: Whole meds with liquid (with puree if pt exhibits coughing with medications) Supervision: Intermittent supervision to cue for compensatory strategies Compensations: Slow rate;Small sips/bites Postural Changes: Seated upright at 90 degrees    Other  Recommendations Oral Care Recommendations: Oral care BID;Oral care prior to ice chip/H20    Recommendations for follow up therapy are one component of a multi-disciplinary discharge planning process, led by the attending physician.  Recommendations may be updated based on patient status, additional functional criteria and insurance authorization.  Follow up Recommendations  (TBD)      Assistance Recommended at Discharge  N/A  Functional Status Assessment Patient has had a recent decline in their functional status and demonstrates the ability to make significant improvements in function in a reasonable and predictable amount of time.  Frequency and Duration min 2x/week  2 weeks       Prognosis Prognosis for improved oropharyngeal function: Good      Swallow Study   General Date of Onset: 08/10/23 HPI: [name] is an 82 y/o gentleman who presented to the hospital for being somnolent today. His wife could barely wake him up earlier and decided to bring him to the ED. When she checked his pulse ox at home he had saturations in the 50s (not on his oxygen). Pt required intubation 9/29-10/1. CXR 9/29: "Right lower lobe atelectasis or pneumonia." Pt with a history of smoking, ESRD on HD TThS. Type of Study: Bedside Swallow Evaluation Previous Swallow Assessment: none Diet  Prior to this Study: NPO Temperature Spikes Noted: No Respiratory Status: Nasal cannula History of Recent Intubation: No Behavior/Cognition: Alert;Cooperative;Pleasant mood Oral Cavity Assessment: Within Functional Limits Oral Care Completed by SLP: Yes Oral Cavity - Dentition: Adequate natural dentition;Missing dentition Self-Feeding Abilities: Able to feed self Patient Positioning: Upright in bed Baseline Vocal Quality: Wet Volitional Cough: Strong Volitional Swallow: Able to elicit    Oral/Motor/Sensory Function Overall Oral Motor/Sensory Function: Within functional limits Facial ROM: Within Functional Limits Facial Symmetry: Within Functional Limits Lingual ROM: Within Functional Limits Lingual Symmetry: Within Functional Limits Lingual Strength: Within Functional Limits Velum: Within Functional Limits Mandible: Within Functional Limits   Ice Chips Ice chips: Not tested   Thin Liquid Thin Liquid: Impaired Presentation: Straw Pharyngeal  Phase Impairments: Cough - Immediate    Nectar Thick Nectar Thick Liquid: Within functional limits Presentation: Cup;Straw   Honey Thick     Puree Puree: Within functional limits Presentation: Spoon   Solid     Solid: Within functional limits Presentation: Spoon      Kerrie Pleasure, MA, CCC-SLP Acute Rehabilitation Services Office: [phone] 08/12/2023,10:53 AM

## 2023-08-12 NOTE — Progress Notes (Addendum)
Jeremy [name] Progress Note  Subjective: 2.4 L off w/ HD yesterday. BP's up today. Extubated early this am. BP's high, no pressors.   Vitals:   08/12/23 1200 08/12/23 1230 08/12/23 1353 08/12/23 1530  BP: (!) 176/74 (!) 188/71 (!) 161/65   Pulse: 75 84 83   Resp: 17 18    Temp:    (!) 97.4 F (36.3 C)  TempSrc:    Oral  SpO2: 92% 93%    Weight:      Height:        Exam: Extubated and alert, [place] O2 No jvd Chest - CTA bilat Cor reg no mrg Abd soft, ntnd  Ext no edema UE/ LE"s   LUA AVF+ bruit     OP HD: [facility]  3h   73.3kg   400/600  2/2 bath  Heparin 2500  LUA AVF Jeremy [name] 2  Last Mircera 7/2    9/29 CXR - R lower lung atx vs pna, no edema   Assessment/Plan: ESRD - rapid decline in renal function with negative serologic w/u eventually started on dialysis and is currently at [facility]. Left at his EDW on Saturday despite a shortened treatment (2hr 55). Pt got HD here yesterday w/ 2.4 L off. Next HD will be Wed off schedule.  UTI/ sepsis - UCx grew GNR. Per pmd IV abx adjusted.  Acute on chronic resp failure - possibly vol component, better off vent. Hx of COPD.  Volume - not vol overloaded on exam. Got 2 L off w/ HD yesterday despite pt being under dry wt.  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 HTN - resume home regimen. 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 Anemia of chronic disease - last received Mircera 60 on 7/2 and Hb was on 10.7 on 9/26. Chronic resp failure - due to OSA + COPD     Jeremy [name]  CKA 08/12/2023, 4:34 PM  Recent Labs  Lab 08/10/23 1559 08/10/23 1611 08/11/23 0244 08/11/23 1739 08/12/23 0750  HGB 10.8*   < > 10.2*  --  10.0*  ALBUMIN 3.3*  --   --   --   --   CALCIUM 8.6*  --  8.5*  --  8.7*  PHOS  --    < > 5.2* 5.2* 6.5*  CREATININE 5.65*  --  6.08*  --  5.13*  K 5.3*   < > 4.7  --  5.1   < >  = values in this interval not displayed.   No results for input(s): "IRON", "TIBC", "FERRITIN" in the last 168 hours. Inpatient medications:  arformoterol  15 mcg Nebulization BID   budesonide (PULMICORT) nebulizer solution  0.5 mg Nebulization BID   Chlorhexidine Gluconate Cloth  6 each Topical Q0600   docusate sodium  100 mg Oral BID   heparin  2,500 Units Dialysis Once in dialysis   heparin  5,000 Units Subcutaneous Q8H   insulin aspart  0-9 Units Subcutaneous Q4H   methylPREDNISolone (SOLU-MEDROL) injection  60 mg Intravenous Q12H   metoprolol succinate  50 mg Oral Daily   mouth rinse  15 mL Mouth Rinse Q2H   [START ON 08/13/2023] polyethylene glycol  17 g Oral Daily   revefenacin  175 mcg Nebulization Daily   sevelamer carbonate  1,600 mg Oral TID WC   [START ON 08/13/2023] thiamine  100 mg Oral Daily  sodium chloride Stopped (08/10/23 1937)   anticoagulant sodium citrate     ceFEPime (MAXIPIME) IV Stopped (08/11/23 2035)   alteplase, anticoagulant sodium citrate, heparin, hydrALAZINE, labetalol, lidocaine (PF), lidocaine-prilocaine, mouth rinse, pentafluoroprop-tetrafluoroeth

## 2023-08-12 NOTE — Progress Notes (Signed)
NAME:  Jeremy [name], MRN:  [id number], DOB:  January 02, 1941, LOS: 2 ADMISSION DATE:  08/10/2023, CONSULTATION DATE:  08/10/23 REFERRING MD:  Eloise Harman- ED , CHIEF COMPLAINT:  hypercapnic respiratory failure   History of Present Illness:  Mr. Ohanlon is an 82 y/o gentleman with a history of ESRD on HD TThS who presented to the hospital for being somnolent today. His wife could barely wake him up earlier and decided to bring him to the ED. When she checked his pulse ox at home he had saturations in the 50s (not on his oxygen). He only uses his oxygen intermittently because he was never given a prescription of how to use it. He was in his usual state of health yesterday after HD. He came home after completing treatment and went to sleep as he usually dose. He completed his treatment for the prescribed time. They have been cutting down his sessions duration due to meeting his lab goals; he had requested to only have HD twice per week, so this was a compromise.   He has a chronic cough and his wife indicates she has noticed him coughing a little more than his usual the past 2 nights. He has a chronic productive cough x many years. He is still smoking 1ppd x ~65 years. He has not complained of pain or any new symptoms. She is worried that he could have a UTI due to symptoms he has had in the past when he had a UTI. He self-catheterizes since he still makes some urine; this morning he got a small amount out. He is obtunded in the ED and not able to provide additional history. All history provided by his wife at bedside. In the ED he was found to be hypercapnic and was placed on bipap.   Per his wife he was playing golf three times per week in early 2024, but has declined since then. He was able to play 14 holes one day last week. He has had intermittent confusion and labile BPs over the past few months, and he no longer drives. He usually sleeps after HD on those days, but is up and around the house the other days of  the week.  Pertinent  Medical History  Chronic respiratory failure on O2 intermittently at home, ESRD on HD TThS, ongoing tobacco abuse, OSA- not on CPAP, HTN HLD  Significant Hospital Events: Including procedures, antibiotic start and stop dates in addition to other pertinent events   9/29 admitted, start on empiric antibitoics, intubated after trial on bipap 10/1: NAEON. Attempting SBT today. Dc flagyl.   Interim History / Subjective:  Wife at bedside updated on plan of care. Her questions were answered. She does note he is supposed to wear a CPAP at night and does not.   Objective   Blood pressure (!) 141/55, pulse 80, temperature 97.9 F (36.6 C), temperature source Oral, resp. rate (!) 28, height 5\' 10"  (1.778 m), weight 69.5 kg, SpO2 99%.    Vent Mode: PRVC FiO2 (%):  [50 %-60 %] 50 % Set Rate:  [28 bmp] 28 bmp Vt Set:  [580 mL] 580 mL PEEP:  [5 cmH20] 5 cmH20 Plateau Pressure:  [14 cmH20-18 cmH20] 16 cmH20   Intake/Output Summary (Last 24 hours) at 08/12/2023 0728 Last data filed at 08/12/2023 0200 Gross per 24 hour  Intake 936.08 ml  Output 2335 ml  Net -1398.92 ml   Filed Weights   08/11/23 1303 08/11/23 2108 08/12/23 0500  Weight: 71.7 kg 69.5  kg 69.5 kg   Examination: General: elderly male, intubated, sedated and restrained  HENT: mucous membranes pink, moist. Intubated  Lungs: intubated, lung sounds clear bilaterally  Cardiovascular: S1S2, no murmur, rub, gallop Abdomen: soft, NGT, +BS Extremities: no edema, muscle wasting Neuro: withdraws to pain, does not follow commands  Derm: some bruising, dry, no diffuse rashes.  Resolved Hospital Problem list   Hyperkalemia Shock Assessment & Plan:  Acute encephalopathy - acidosis vs uremia  Shock - resolving; likely related to severe acidosis Sepsis 2/2 urinary tract infection ? - CT head negative. Acidosis resolved.  - vasopressors stopped 9/30 - UA with leuks, >50 WBC. Urine with purulence/sediment. No  history of ESBL. He does have history of e.coli, enterobacter UTI susceptible to cefepime.  - d/c flagyl today. Continue cefepime. Obtaining MRSA PCR to hopefully dc vanc.  - trend WBC and fever curve. Currently without fever or leukocytosis.   Acute on chronic respiratory failure with hypercapnia and hypoxia OSA, not on CPAP Concern for acute pulmonary edema> concern he may now be getting under- dialyzed due to changed HD regimen. - SBT this morning, possible extubation depending.  - Continue bronchodilators due to suspicion for COPD - Continue solumedrol 60mg  IV BID - Nephrology following  ESRD AGMA-- has a historyof this in previous admissions with normal bicarb-- presumably due to renal failure - last HD on 9/30. removed  - nephrology consult for iHD needs - renally dose meds, avoid nephrotoxic meds - hold renvela until eating  Mild troponin elevation; suspect this is due to critical illness, although he has increased risk for CAD with smoking, ESRD, age, male gender. EKG concerning for inferolateral ischemia potentially. - troponin peak 269  - trend and repeat EKG PRN - unable to take oral meds; but can start aspirin and statin this admission  History of hypertension -hold metoprolol for now  Anemia- chronic, likely due to renal failure - monitor for bleeding - transfuse for Hb<7 or hemodynamically significant bleeding  Deconditioning - will need PT & OT; has been declining significantly over the past 10 months per wife  Best Practice (right click and "Reselect all SmartList Selections" daily)   Diet/type: NPO DVT prophylaxis: prophylactic heparin  GI prophylaxis: H2B Lines: N/A Foley:  N/A Code Status:  full code Last date of multidisciplinary goals of care discussion [wife updated at bedside 9/29- wants all aggressive care measures]  Labs   CBC: Recent Labs  Lab 08/10/23 1559 08/10/23 1611 08/10/23 1922 08/11/23 0244  WBC 6.6  --   --  6.4  HGB 10.8*  11.9* 10.9* 10.2*  HCT 34.7* 35.0* 32.0* 30.8*  MCV 102.7*  --   --  97.5  PLT 268  --   --  251    Basic Metabolic Panel: Recent Labs  Lab 08/10/23 1559 08/10/23 1611 08/10/23 1922 08/11/23 0244 08/11/23 1739  NA 137 135 134* 134*  --   K 5.3* 5.3* 5.5* 4.7  --   CL 90*  --   --  96*  --   CO2 30  --   --  22  --   GLUCOSE 102*  --   --  126*  --   BUN 32*  --   --  44*  --   CREATININE 5.65*  --   --  6.08*  --   CALCIUM 8.6*  --   --  8.5*  --   MG  --   --   --  2.5* 1.9  PHOS  --   --   --  5.2* 5.2*   GFR: Estimated Creatinine Clearance: 9 mL/min (A) (by C-G formula based on SCr of 6.08 mg/dL (H)). Recent Labs  Lab 08/10/23 1559 08/10/23 1713 08/10/23 2326 08/11/23 0244 08/11/23 0841 08/11/23 1519  WBC 6.6  --   --  6.4  --   --   LATICACIDVEN  --  0.8 2.1*  --  1.3 1.2    Liver Function Tests: Recent Labs  Lab 08/10/23 1559  AST 13*  ALT 10  ALKPHOS 65  BILITOT 0.6  PROT 7.2  ALBUMIN 3.3*   No results for input(s): "LIPASE", "AMYLASE" in the last 168 hours. No results for input(s): "AMMONIA" in the last 168 hours.  ABG    Component Value Date/Time   PHART 7.221 (L) 08/10/2023 1922   PCO2ART 76.3 (HH) 08/10/2023 1922   PO2ART 191 (H) 08/10/2023 1922   HCO3 24.0 08/10/2023 2324   TCO2 34 (H) 08/10/2023 1922   ACIDBASEDEF 0.2 08/10/2023 2324   O2SAT 92.6 08/10/2023 2324     Coagulation Profile: No results for input(s): "INR", "PROTIME" in the last 168 hours.  Cardiac Enzymes: No results for input(s): "CKTOTAL", "CKMB", "CKMBINDEX", "TROPONINI" in the last 168 hours.  HbA1C: Hgb A1c MFr Bld  Date/Time Value Ref Range Status  08/10/2023 11:26 PM 5.5 4.8 - 5.6 % Final    Comment:    (NOTE) Pre diabetes:          5.7%-6.4%  Diabetes:              >6.4%  Glycemic control for   <7.0% adults with diabetes   07/29/2013 05:07 PM 5.6 <5.7 % Final    Comment:    (NOTE)                                                                        According to the ADA Clinical Practice Recommendations for 2011, when HbA1c is used as a screening test:  >=6.5%   Diagnostic of Diabetes Mellitus           (if abnormal result is confirmed) 5.7-6.4%   Increased risk of developing Diabetes Mellitus References:Diagnosis and Classification of Diabetes Mellitus,Diabetes Care,2011,34(Suppl 1):S62-S69 and Standards of Medical Care in         Diabetes - 2011,Diabetes Care,2011,34 (Suppl 1):S11-S61.    CBG: Recent Labs  Lab 08/11/23 1248 08/11/23 1649 08/11/23 1930 08/11/23 2338 08/12/23 0320  GLUCAP 108* 109* 112* 124* 126*     Critical care time: 35 min.

## 2023-08-12 NOTE — Progress Notes (Signed)
Afternoon rounds: Extubated this morning, doing well on [place]. Does not appear to be encephalopathic at this time. Shock appears to be resolving and continues to be off pressors. This is likely urinary tract infection/sepsis. Urine culture GNR so narrowed to cefepime. Dc flagyl and vanc. No fevers or WBC. Ideally should wear BiPAP at night.

## 2023-08-12 NOTE — Progress Notes (Signed)
   08/12/23 2025  BiPAP/CPAP/SIPAP  BiPAP/CPAP/SIPAP Pt Type Adult  BiPAP/CPAP/SIPAP Resmed  Mask Type Full face mask  Mask Size Large  Respiratory Rate 18 breaths/min  IPAP 10 cmH20  EPAP 5 cmH2O  FiO2 (%) 32 %  Flow Rate 3 lpm  Patient Home Equipment No  Auto Titrate No  BiPAP/CPAP /SiPAP Vitals  Pulse Rate 74  Resp 18  SpO2 95 %  Bilateral Breath Sounds Clear;Diminished

## 2023-08-12 NOTE — Evaluation (Signed)
Physical Therapy Evaluation Patient Details Name: Jeremy [name] MRN: [id number] DOB: May 14, 1941 Today's Date: 08/12/2023  History of Present Illness  82 y.o. male presents to [facility] hospital on 08/10/2023 with somnolence. Pt found to be hypoxic. Concern for possible sepsis 2/2 UTI. Pt required intubation on 9/29, extubated on 10/1. PMH includes chronic respiratory failure, ESRD, tobacco abuse, OSA, HTN, HLD.  Clinical Impression  Pt presents to PT with deficits in functional mobility, gait, balance, strength, power, endurance, cognition. Pt is generally weak, requiring physical assistance for transfers and BUE support to maintain standing balance at this time. Pt will benefit from frequent mobilization in an effort to improve LE strength and activity tolerance. Pt is at a high risk for falls due to instability and poor endurance. PT recommends rehab consult as pt was independent and still golfing frequently prior to this admission.        If plan is discharge home, recommend the following: A lot of help with walking and/or transfers;A lot of help with bathing/dressing/bathroom;Assistance with cooking/housework;Assist for transportation;Help with stairs or ramp for entrance   Can travel by private vehicle        Equipment Recommendations Rolling walker (2 wheels);BSC/3in1 (recommendations may change pending pt progress)  Recommendations for Other Services  Rehab consult    Functional Status Assessment Patient has had a recent decline in their functional status and demonstrates the ability to make significant improvements in function in a reasonable and predictable amount of time.     Precautions / Restrictions Precautions Precautions: Fall Precaution Comments: foley Restrictions Weight Bearing Restrictions: No      Mobility  Bed Mobility Overal bed mobility: Needs Assistance Bed Mobility: Supine to Sit     Supine to sit: Supervision, HOB elevated          Transfers Overall  transfer level: Needs assistance Equipment used: Rolling walker (2 wheels), 1 person hand held assist Transfers: Sit to/from Stand, Bed to chair/wheelchair/BSC Sit to Stand: Min assist   Step pivot transfers: Mod assist       General transfer comment: pt with improved stability with support of RW, unable to sufficiently weight shift to step without BUE support    Ambulation/Gait Ambulation/Gait assistance: Min assist Gait Distance (Feet): 15 Feet Assistive device: Rolling walker (2 wheels) Gait Pattern/deviations: Step-to pattern Gait velocity: reduced Gait velocity interpretation: <1.31 ft/sec, indicative of household ambulator   General Gait Details: slowed step-to gait, increased lateral and AP sway  Stairs            Wheelchair Mobility     Tilt Bed    Modified Rankin (Stroke Patients Only)       Balance Overall balance assessment: Needs assistance Sitting-balance support: No upper extremity supported, Feet supported Sitting balance-Leahy Scale: Fair     Standing balance support: Bilateral upper extremity supported, Reliant on assistive device for balance Standing balance-Leahy Scale: Poor                               Pertinent Vitals/Pain Pain Assessment Pain Assessment: No/denies pain    Home Living Family/patient expects to be discharged to:: Private residence Living Arrangements: Spouse/significant other Available Help at Discharge: Family Type of Home: House Home Access: Stairs to enter Entrance Stairs-Rails: Can reach both Entrance Stairs-Number of Steps: 4 Alternate Level Stairs-Number of Steps: stair lift Home Layout: Multi-level Home Equipment: Cane - single point;Shower seat      Prior Function Prior Level  of Function : Independent/Modified Independent;Driving             Mobility Comments: recent decline in mobility although still golfing. Occasional use of stair lift when fatigued       Extremity/Trunk  Assessment   Upper Extremity Assessment Upper Extremity Assessment: Generalized weakness    Lower Extremity Assessment Lower Extremity Assessment: Generalized weakness    Cervical / Trunk Assessment Cervical / Trunk Assessment: Normal  Communication   Communication Communication: Hearing impairment Cueing Techniques: Verbal cues  Cognition Arousal: Alert Behavior During Therapy: WFL for tasks assessed/performed Overall Cognitive Status: Impaired/Different from baseline Area of Impairment: Memory, Safety/judgement, Awareness, Problem solving                     Memory: Decreased short-term memory   Safety/Judgement: Decreased awareness of safety, Decreased awareness of deficits Awareness: Emergent Problem Solving: Slow processing, Requires verbal cues          General Comments General comments (skin integrity, edema, etc.): pt on 3L [place], sats intermittently drop to hgih 80s but recover well with verbal cues for pursed lip breathing.    Exercises     Assessment/Plan    PT Assessment Patient needs continued PT services  PT Problem List Decreased strength;Decreased activity tolerance;Decreased balance;Decreased mobility;Decreased cognition;Decreased safety awareness;Decreased knowledge of use of DME;Decreased knowledge of precautions;Cardiopulmonary status limiting activity       PT Treatment Interventions DME instruction;Gait training;Stair training;Functional mobility training;Therapeutic activities;Therapeutic exercise;Balance training;Neuromuscular re-education;Patient/family education;Cognitive remediation    PT Goals (Current goals can be found in the Care Plan section)  Acute Rehab PT Goals Patient Stated Goal: to return to independence in mobility, golfing PT Goal Formulation: With patient Time For Goal Achievement: 08/26/23 Potential to Achieve Goals: Fair    Frequency Min 1X/week     Co-evaluation               AM-PAC PT "6 Clicks"  Mobility  Outcome Measure Help needed turning from your back to your side while in a flat bed without using bedrails?: A Little Help needed moving from lying on your back to sitting on the side of a flat bed without using bedrails?: A Little Help needed moving to and from a bed to a chair (including a wheelchair)?: A Little Help needed standing up from a chair using your arms (e.g., wheelchair or bedside chair)?: A Little Help needed to walk in hospital room?: Total Help needed climbing 3-5 steps with a railing? : Total 6 Click Score: 14    End of Session Equipment Utilized During Treatment: Oxygen Activity Tolerance: Patient tolerated treatment well Patient left: in chair;with call bell/phone within reach;with chair alarm set Nurse Communication: Mobility status PT Visit Diagnosis: Other abnormalities of gait and mobility (R26.89);Muscle weakness (generalized) (M62.81)    Time: 1610-9604 PT Time Calculation (min) (ACUTE ONLY): 33 min   Charges:   PT Evaluation $PT Eval Moderate Complexity: 1 Mod   PT General Charges $$ ACUTE PT VISIT: 1 Visit         Arlyss Gandy, PT, DPT Acute Rehabilitation Office [phone]   Arlyss Gandy 08/12/2023, 5:01 PM

## 2023-08-12 NOTE — Procedures (Signed)
Extubation Procedure Note  Patient Details:   Name: Jeremy [name] DOB: September 30, 1941 MRN: [id number]   Airway Documentation:    Vent end date: 08/12/23 Vent end time: 0914   Evaluation  O2 sats: stable throughout Complications: No apparent complications Patient did tolerate procedure well. Bilateral Breath Sounds: Clear, Diminished   Yes  Patient extubated per order to 4L [place] with no apparent complications. Positive cuff leak was noted prior to extubation. Patient is alert, has strong cough, and is able to speak. Vitals are stable. RT will continue to monitor.   Ambert Virrueta Lajuana Ripple 08/12/2023, 9:27 AM

## 2023-08-13 ENCOUNTER — Inpatient Hospital Stay (HOSPITAL_COMMUNITY): Payer: Medicare Other

## 2023-08-13 DIAGNOSIS — J9622 Acute and chronic respiratory failure with hypercapnia: Secondary | ICD-10-CM | POA: Diagnosis not present

## 2023-08-13 DIAGNOSIS — E440 Moderate protein-calorie malnutrition: Secondary | ICD-10-CM | POA: Insufficient documentation

## 2023-08-13 LAB — CBC
HCT: 32.3 % — ABNORMAL LOW (ref 39.0–52.0)
Hemoglobin: 10.3 g/dL — ABNORMAL LOW (ref 13.0–17.0)
MCH: 30.7 pg (ref 26.0–34.0)
MCHC: 31.9 g/dL (ref 30.0–36.0)
MCV: 96.4 fL (ref 80.0–100.0)
Platelets: 267 10*3/uL (ref 150–400)
RBC: 3.35 MIL/uL — ABNORMAL LOW (ref 4.22–5.81)
RDW: 14.4 % (ref 11.5–15.5)
WBC: 11.3 10*3/uL — ABNORMAL HIGH (ref 4.0–10.5)
nRBC: 0 % (ref 0.0–0.2)

## 2023-08-13 LAB — CULTURE, RESPIRATORY W GRAM STAIN

## 2023-08-13 LAB — GLUCOSE, CAPILLARY
Glucose-Capillary: 118 mg/dL — ABNORMAL HIGH (ref 70–99)
Glucose-Capillary: 149 mg/dL — ABNORMAL HIGH (ref 70–99)
Glucose-Capillary: 161 mg/dL — ABNORMAL HIGH (ref 70–99)
Glucose-Capillary: 145 mg/dL — ABNORMAL HIGH (ref 70–99)

## 2023-08-13 LAB — URINE CULTURE: Culture: >=100000 — AB

## 2023-08-13 LAB — BASIC METABOLIC PANEL
Anion gap: 17 — ABNORMAL HIGH (ref 5–15)
BUN: 91 mg/dL — ABNORMAL HIGH (ref 8–23)
CO2: 22 mmol/L (ref 22–32)
Calcium: 8.5 mg/dL — ABNORMAL LOW (ref 8.9–10.3)
Chloride: 93 mmol/L — ABNORMAL LOW (ref 98–111)
Creatinine, Ser: 7.02 mg/dL — ABNORMAL HIGH (ref 0.61–1.24)
GFR, Estimated: 7 mL/min — ABNORMAL LOW (ref >60)
Glucose, Bld: 134 mg/dL — ABNORMAL HIGH (ref 70–99)
Potassium: 5.9 mmol/L — ABNORMAL HIGH (ref 3.5–5.1)
Sodium: 132 mmol/L — ABNORMAL LOW (ref 135–145)

## 2023-08-13 MED ORDER — HEPARIN SODIUM (PORCINE) 1000 UNIT/ML DIALYSIS: 1000.0000 [IU] | INTRAMUSCULAR | Status: DC | PRN

## 2023-08-13 MED ORDER — ANTICOAGULANT SODIUM CITRATE 4% (200MG/5ML) IV SOLN: 5.0000 mL | Status: DC | PRN

## 2023-08-13 MED ORDER — HEPARIN SODIUM (PORCINE) 1000 UNIT/ML IJ SOLN
2000.0000 [IU] | Freq: Once | INTRAMUSCULAR | Status: AC
  Administered 2023-08-13: 2000 [IU]
  Filled 2023-08-13: qty 2

## 2023-08-13 MED ORDER — NEPRO/CARBSTEADY PO LIQD: 237.0000 mL | ORAL | Status: DC | PRN

## 2023-08-13 MED ORDER — RENA-VITE PO TABS
1.0000 | ORAL_TABLET | Freq: Every day | ORAL | Status: DC
  Administered 2023-08-13 – 2023-08-18 (×6): 1 via ORAL
  Filled 2023-08-13 (×6): qty 1

## 2023-08-13 MED ORDER — LIDOCAINE-PRILOCAINE 2.5-2.5 % EX CREA: 1.0000 | TOPICAL_CREAM | CUTANEOUS | Status: DC | PRN

## 2023-08-13 MED ORDER — ALTEPLASE 2 MG IJ SOLR: 2.0000 mg | Freq: Once | INTRAMUSCULAR | Status: DC | PRN

## 2023-08-13 MED ORDER — INSULIN ASPART 100 UNIT/ML IJ SOLN
0.0000 [IU] | Freq: Three times a day (TID) | INTRAMUSCULAR | Status: DC
  Administered 2023-08-14: 2 [IU] via SUBCUTANEOUS
  Administered 2023-08-14 – 2023-08-15 (×2): 1 [IU] via SUBCUTANEOUS
  Administered 2023-08-16: 2 [IU] via SUBCUTANEOUS
  Administered 2023-08-16 – 2023-08-17 (×2): 3 [IU] via SUBCUTANEOUS
  Administered 2023-08-17 – 2023-08-18 (×2): 2 [IU] via SUBCUTANEOUS
  Administered 2023-08-18: 1 [IU] via SUBCUTANEOUS

## 2023-08-13 MED ORDER — HEPARIN SODIUM (PORCINE) 1000 UNIT/ML IJ SOLN
2000.0000 [IU] | Freq: Once | INTRAMUSCULAR | Status: AC
  Administered 2023-08-13: 2000 [IU] via INTRAVENOUS

## 2023-08-13 MED ORDER — HEPARIN SODIUM (PORCINE) 1000 UNIT/ML DIALYSIS: 2000.0000 [IU] | INTRAMUSCULAR | Status: DC | PRN

## 2023-08-13 MED ORDER — LIDOCAINE HCL (PF) 1 % IJ SOLN: 5.0000 mL | INTRAMUSCULAR | Status: DC | PRN

## 2023-08-13 MED ORDER — PENTAFLUOROPROP-TETRAFLUOROETH EX AERO: 1.0000 | INHALATION_SPRAY | CUTANEOUS | Status: DC | PRN

## 2023-08-13 MED ORDER — HEPARIN SODIUM (PORCINE) 1000 UNIT/ML DIALYSIS
2000.0000 [IU] | Freq: Once | INTRAMUSCULAR | Status: AC
  Administered 2023-08-13: 2000 [IU] via INTRAVENOUS_CENTRAL

## 2023-08-13 NOTE — Progress Notes (Signed)
Nutrition Follow-up  DOCUMENTATION CODES:   Non-severe (moderate) malnutrition in context of chronic illness  INTERVENTION:   - Liberalize diet to Regular with nectar-thick liquids  - Magic Cup TID with meals, each supplement provides 290 kcal and 9 grams of protein  - Renal MVI daily  NUTRITION DIAGNOSIS:   Moderate Malnutrition related to chronic illness (ESRD/HD, COPD) as evidenced by moderate fat depletion, severe muscle depletion.  Ongoing, being addressed via diet advancement and oral nutrition supplements  GOAL:   Patient will meet greater than or equal to 90% of their needs  Progressing  MONITOR:   Vent status, Labs, Weight trends, TF tolerance, I & O's  REASON FOR ASSESSMENT:   Ventilator, Consult Enteral/tube feeding initiation and management  ASSESSMENT:   82 year old male who presented to the ED on 9/20 with SOB. 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 Pt admitted with acute encephalopathy, shock.  9/29 - intubated after trial on BiPAP 10/1 - extubated, diet advanced to Heart Healthy/Carb Modified with nectar-thick liquids  Pt unavailable at time of RD visit. Noted plan to continue regular diet with nectar-thick liquids pending results of MBS. Will order thickened oral nutrition supplements to come with meals. Will also liberalize diet from Heart Healthy/Carb Modified to Regular to provide more options at mealtimes to promote PO intake given malnutrition.  EDW: 73.3 kg Admit weight: 73.9 kg Current weight: 72.8 kg  Meal Completion: 100% x 2 documented meals  Medications reviewed and include: colace, SSI every 4 hours, IV solu-medrol, miralax, renvela 1600 mg TID with meals, thiamine, IV abx  Labs reviewed: sodium 132, potassium 5.9, chloride 93, phosphorus 7.4 on 10/1, WBC 11.3 CBG's: 118-149 x 24 hours  Diet  Order:   Diet Order             Diet regular Room service appropriate? Yes; Fluid consistency: Nectar Thick  Diet effective now                   EDUCATION NEEDS:   Not appropriate for education at this time  Skin:  Skin Assessment: Reviewed RN Assessment (skin tear L shoulder)  Last BM:  08/11/23  Height:   Ht Readings from Last 1 Encounters:  08/10/23 5\' 10"  (1.778 m)    Weight:   Wt Readings from Last 1 Encounters:  08/13/23 72.8 kg    Ideal Body Weight:  75.5 kg  BMI:  Body mass index is 23.03 kg/m.  Estimated Nutritional Needs:   Kcal:  1800-2000  Protein:  90-110 grams  Fluid:  1000 ml + UOP    Mertie Clause, MS, RD, LDN Registered Dietitian II Please see AMiON for contact information.

## 2023-08-13 NOTE — Evaluation (Signed)
Modified Barium Swallow Study  Patient Details  Name: Jeremy [name] MRN: [id number] Date of Birth: 23-Mar-1941  Today's Date: 08/13/2023  Modified Barium Swallow completed.  Full report located under Chart Review in the Imaging Section.  History of Present Illness Mr. Crehan is an 82 y/o gentleman who presented to the hospital for being somnolent today. His wife could barely wake him up earlier and decided to bring him to the ED. When she checked his pulse ox at home he had saturations in the 50s (not on his oxygen). Pt required intubation 9/29-10/1. CXR 9/29: "Right lower lobe atelectasis or pneumonia." Pt with a history of smoking, ESRD on HD TThS.   Clinical Impression Patient presents with a moderate oropharyngeal dysphagia characterized by significant delay in swallow initiation (thinner > thicker viscosities) resulting in relatively consistent silent aspiration of thin liquids and deep penetration of both nectar thick and honey thick liquids without awareness. Base of tongue weakness results in moderate vallecular residue post swallow. Chin tuck assists to decrease degree of vallecular residue and severity of aspiration however it does not fully prevent penetration/aspiration episodes with liquids. This is likely impacted by intubation however suspect some degree of baseline dysphagia as intubation was not particularly prolonged. Will initiate a conservative diet and f/u closely. Factors that may increase risk of adverse event in presence of aspiration [employer]ye [employer]aks [employer]ance Coots 2021): Respiratory or GI disease;Frail or deconditioned  Swallow Evaluation Recommendations Recommendations: PO diet PO Diet Recommendation: Extremely thick liquids (Level 4, pudding thick);Dysphagia 1 (Pureed) Liquid Administration via: Spoon Medication Administration: Crushed with puree Supervision: Patient able to self-feed;Full assist for feeding Swallowing strategies  : Slow rate;Small bites/sips;Chin tuck Postural  changes: Position pt fully upright for meals Oral care recommendations: Oral care BID (2x/day) Caregiver Recommendations: Avoid jello, ice cream, thin soups, popsicles;Remove water pitcher     Lacey Dotson MA, CCC-SLP  Ethan Kasperski Meryl 08/13/2023,1:38 PM

## 2023-08-13 NOTE — Progress Notes (Signed)
Received patient in bed to unit.  Alert and oriented.  Informed consent signed and in chart.   TX duration:3.5 hours  Patient tolerated well.  Transported back to the room  Alert, without acute distress.  Hand-off given to patient's nurse.   Access used: Left Upper arm fistula Access issues: none  Total UF removed: Medication(s) given: none   08/13/23 1825  Vitals  Temp 97.7 F (36.5 C)  Temp Source Oral  BP (!) 152/70  MAP (mmHg) 95  Pulse Rate 80  ECG Heart Rate 81  Resp 20  Oxygen Therapy  SpO2 96 %  O2 Device Nasal Cannula  O2 Flow Rate (L/min) 6 L/min  During Treatment Monitoring  Duration of HD Treatment -hour(s) 3.5 hour(s)  HD Safety Checks Performed Yes  Intra-Hemodialysis Comments Tx completed;Tolerated well  Dialysis Fluid Bolus Normal Saline  Bolus Amount (mL) 300 mL  Fistula / Graft Left Upper arm Arteriovenous fistula  Placement Date/Time: 10/16/22 1059   Placed prior to admission: No  Orientation: Left  Access Location: Upper arm  Access Type: Arteriovenous fistula  Status Deaccessed     Stacie Glaze LPN [facility]

## 2023-08-13 NOTE — Progress Notes (Signed)
PROGRESS NOTE    Jeremy [name]  QVZ:[id number] DOB: [birth year] DOA: 08/10/2023 PCP: Everrett Coombe, DO   Brief Narrative: 82 year old with past medical history significant for ESRD on hemodialysis TThS, who presented with Somnolent, wife could not wake him up, in the ED he was found to be hypoxic, oxygen saturation 50%, he only used oxygen intermittently.  He completed hemodialysis the day prior to admission.  He was noted to be coughing more.  He continues to smoke 1 pack/day.  Patient failed a trial on BiPAP and he was intubated on 9/29.  He was admitted with acute metabolic encephalopathy secondary to acidosis, shock, sepsis secondary to urinary tract infection versus pneumonia.  He required IV pressors and admitted by CCM.  He was  extubated 10/01. Off pressors.  Care transferred to triad 10/02.     Assessment & Plan:   Active Problems:   Hypoxia   Acute on chronic respiratory failure with hypercapnia (HCC)   Malnutrition of moderate degree  1-Acute metabolic encephalopathy, in the setting of acidosis, infection, hypoxia, Hypercapnia.  -CT Head: No acute intracranial abnormality -Improving, he is alert and answering questions  Severe Sepsis,  shock required IV pressors -In the setting of pneumonia and UTI -He has been off of pressors -Chest x-ray on 9/29: Showed right lower lobe atelectasis or pneumonia. -Predatory culture growing Pseudomonas -Urine culture growing gram-negative rods -Continue  with cefepime  Acute on chronic hypoxic respiratory failure with hypercapnia OSA not on CPAP -Patient require intubation 9/29 Continue with bronchodilators On IV steroids Respiratory failure could be in the setting of acute pulmonary edema concern he may under dialyzed due to change in hemodialysis regimen Concern for pneumonia, sputum culture growing Pseudomonas Respiratory toilet, flutter valve, incentive spirometry Requiring 5 L of oxygen   ESRD on hemodialysis Hyponatremia:  Correction with HD.  Hyperkalemia: Correction with HD.  Anion gap metabolic acidosis He is due for dialysis today   Mild Elevation of troponins In the setting of sepsis.  No chest pain.  Hypertension; on metoprolol  Anemia of chronic kidney disease Monitor Hb.   Deconditioning Urine Retention; Continue self catheterization.   Dysphagia;  Evaluated by speech. On dysphagia 3 diet     Nutrition Problem: Moderate Malnutrition Etiology: chronic illness (ESRD/HD, COPD)    Signs/Symptoms: moderate fat depletion, severe muscle depletion    Interventions: Tube feeding, Refer to RD note for recommendations  Estimated body mass index is 23.03 kg/m as calculated from the following:   Height as of this encounter: 5\' 10"  (1.778 m).   Weight as of this encounter: 72.8 kg.   DVT prophylaxis: Heparin  Code Status: Full code Family Communication: care discussed with patient Disposition Plan:  Status is: Inpatient Remains inpatient appropriate because: management of resp failure    Consultants:  CCM Nephrology   Procedures:  Mechanical intubation.   Antimicrobials:    Subjective: He is alert, feeling he is breathing better that when he came. He is coughing up a lot of phlegm.   Objective: Vitals:   08/13/23 0430 08/13/23 0500 08/13/23 0530 08/13/23 0600  BP:   (!) 165/87   Pulse: 75 77 78 74  Resp: 19 (!) 22 20 19   Temp:      TempSrc:      SpO2: 92% (!) 89% 91% 91%  Weight:  72.8 kg    Height:        Intake/Output Summary (Last 24 hours) at 08/13/2023 3295 Last data filed at 08/12/2023 2300 Gross per 24  hour  Intake 929.85 ml  Output --  Net 929.85 ml   Filed Weights   08/11/23 2108 08/12/23 0500 08/13/23 0500  Weight: 69.5 kg 69.5 kg 72.8 kg    Examination:  General exam: Appears calm and comfortable  Respiratory system: BL ronchus Respiratory effort normal. Cardiovascular system: S1 & S2 heard, RRR.  Gastrointestinal system: Abdomen is  nondistended, soft and nontender. No organomegaly or masses felt. Normal bowel sounds heard. Central nervous system: Alert and oriented. Extremities: Symmetric 5 x 5 power.    Data Reviewed: I have personally reviewed following labs and imaging studies  CBC: Recent Labs  Lab 08/10/23 1559 08/10/23 1611 08/10/23 1922 08/11/23 0244 08/12/23 0750  WBC 6.6  --   --  6.4 10.6*  HGB 10.8* 11.9* 10.9* 10.2* 10.0*  HCT 34.7* 35.0* 32.0* 30.8* 30.8*  MCV 102.7*  --   --  97.5 96.9  PLT 268  --   --  251 256   Basic Metabolic Panel: Recent Labs  Lab 08/10/23 1559 08/10/23 1611 08/10/23 1922 08/11/23 0244 08/11/23 1739 08/12/23 0750 08/12/23 1651  NA 137 135 134* 134*  --  133*  --   K 5.3* 5.3* 5.5* 4.7  --  5.1  --   CL 90*  --   --  96*  --  91*  --   CO2 30  --   --  22  --  23  --   GLUCOSE 102*  --   --  126*  --  133*  --   BUN 32*  --   --  44*  --  50*  --   CREATININE 5.65*  --   --  6.08*  --  5.13*  --   CALCIUM 8.6*  --   --  8.5*  --  8.7*  --   MG  --   --   --  2.5* 1.9 2.2 2.3  PHOS  --   --   --  5.2* 5.2* 6.5* 7.4*   GFR: Estimated Creatinine Clearance: 11.2 mL/min (A) (by C-G formula based on SCr of 5.13 mg/dL (H)). Liver Function Tests: Recent Labs  Lab 08/10/23 1559  AST 13*  ALT 10  ALKPHOS 65  BILITOT 0.6  PROT 7.2  ALBUMIN 3.3*   No results for input(s): "LIPASE", "AMYLASE" in the last 168 hours. No results for input(s): "AMMONIA" in the last 168 hours. Coagulation Profile: No results for input(s): "INR", "PROTIME" in the last 168 hours. Cardiac Enzymes: No results for input(s): "CKTOTAL", "CKMB", "CKMBINDEX", "TROPONINI" in the last 168 hours. BNP (last 3 results) No results for input(s): "PROBNP" in the last 8760 hours. HbA1C: Recent Labs    08/10/23 2326  HGBA1C 5.5   CBG: Recent Labs  Lab 08/12/23 1123 08/12/23 1528 08/12/23 1950 08/12/23 2330 08/13/23 0344  GLUCAP 134* 135* 121* 131* 118*   Lipid Profile: No results  for input(s): "CHOL", "HDL", "LDLCALC", "TRIG", "CHOLHDL", "LDLDIRECT" in the last 72 hours. Thyroid Function Tests: No results for input(s): "TSH", "T4TOTAL", "FREET4", "T3FREE", "THYROIDAB" in the last 72 hours. Anemia Panel: No results for input(s): "VITAMINB12", "FOLATE", "FERRITIN", "TIBC", "IRON", "RETICCTPCT" in the last 72 hours. Sepsis Labs: Recent Labs  Lab 08/10/23 1713 08/10/23 2326 08/11/23 0841 08/11/23 1519  LATICACIDVEN 0.8 2.1* 1.3 1.2    Recent Results (from the past 240 hour(s))  SARS Coronavirus 2 by RT PCR (hospital order, performed in [facility] hospital lab) *cepheid single result test* Anterior Nasal Swab  Status: None   Collection Time: 08/10/23  4:08 PM   Specimen: Anterior Nasal Swab  Result Value Ref Range Status   SARS Coronavirus 2 by RT PCR NEGATIVE NEGATIVE Final    Comment: Performed at [facility], [address] [place], [place] [address]  Blood culture (routine x 2)     Status: None (Preliminary result)   Collection Time: 08/10/23  5:18 PM   Specimen: BLOOD RIGHT ARM  Result Value Ref Range Status   Specimen Description BLOOD RIGHT ARM  Final   Special Requests   Final    BOTTLES DRAWN AEROBIC AND ANAEROBIC Blood Culture adequate volume   Culture   Final    NO GROWTH 3 DAYS Performed at [facility], [address], [place], [place] [address]    Report Status PENDING  Incomplete  Blood culture (routine x 2)     Status: None (Preliminary result)   Collection Time: 08/10/23  5:47 PM   Specimen: BLOOD RIGHT ARM  Result Value Ref Range Status   Specimen Description BLOOD RIGHT ARM  Final   Special Requests   Final    BOTTLES DRAWN AEROBIC AND ANAEROBIC Blood Culture adequate volume   Culture   Final    NO GROWTH 3 DAYS Performed at [facility], [address], [place], [place] [address]    Report Status PENDING  Incomplete  Urine Culture (for pregnant, neutropenic or urologic patients or patients with an indwelling  urinary catheter)     Status: Abnormal (Preliminary result)   Collection Time: 08/10/23  6:16 PM   Specimen: In/Out Cath Urine  Result Value Ref Range Status   Specimen Description IN/OUT CATH URINE  Final   Special Requests NONE  Final   Culture (A)  Final    >=100,000 COLONIES/mL GRAM NEGATIVE RODS SUSCEPTIBILITIES TO FOLLOW Performed at [facility], [address], [place], [place] [address]    Report Status PENDING  Incomplete  Culture, Respiratory w Gram Stain     Status: None (Preliminary result)   Collection Time: 08/11/23  8:07 AM   Specimen: Tracheal Aspirate; Respiratory  Result Value Ref Range Status   Specimen Description TRACHEAL ASPIRATE  Final   Special Requests NONE  Final   Gram Stain   Final    FEW WBC PRESENT, PREDOMINANTLY PMN ABUNDANT GRAM POSITIVE COCCI IN CLUSTERS FEW GRAM POSITIVE COCCI IN CHAINS FEW GRAM POSITIVE RODS    Culture   Final    FEW PSEUDOMONAS AERUGINOSA SUSCEPTIBILITIES TO FOLLOW Performed at [facility], [address] [place], [place] [address]    Report Status PENDING  Incomplete  Respiratory (~20 pathogens) panel by PCR     Status: None   Collection Time: 08/11/23  6:50 PM  Result Value Ref Range Status   Adenovirus NOT DETECTED NOT DETECTED Final   Coronavirus 229E NOT DETECTED NOT DETECTED Final    Comment: (NOTE) The Coronavirus on the Respiratory Panel, DOES NOT test for the novel  Coronavirus (2019 nCoV)    Coronavirus HKU1 NOT DETECTED NOT DETECTED Final   Coronavirus NL63 NOT DETECTED NOT DETECTED Final   Coronavirus OC43 NOT DETECTED NOT DETECTED Final   Metapneumovirus NOT DETECTED NOT DETECTED Final   Rhinovirus / Enterovirus NOT DETECTED NOT DETECTED Final   Influenza A NOT DETECTED NOT DETECTED Final   Influenza B NOT DETECTED NOT DETECTED Final   Parainfluenza Virus 1 NOT DETECTED NOT DETECTED Final   Parainfluenza  Virus 2 NOT DETECTED NOT DETECTED Final   Parainfluenza Virus 3 NOT DETECTED NOT  DETECTED Final   Parainfluenza Virus 4 NOT DETECTED NOT DETECTED Final   Respiratory Syncytial Virus NOT DETECTED NOT DETECTED Final   Bordetella pertussis NOT DETECTED NOT DETECTED Final   Bordetella Parapertussis NOT DETECTED NOT DETECTED Final   Chlamydophila pneumoniae NOT DETECTED NOT DETECTED Final   Mycoplasma pneumoniae NOT DETECTED NOT DETECTED Final    Comment: Performed at [facility], [address] [place], [place] [address]  MRSA Next Gen by PCR, Nasal     Status: None   Collection Time: 08/12/23 10:12 AM   Specimen: Nasal Mucosa; Nasal Swab  Result Value Ref Range Status   MRSA by PCR Next Gen NOT DETECTED NOT DETECTED Final    Comment: (NOTE) The GeneXpert MRSA Assay (FDA approved for NASAL specimens only), is one component of a comprehensive MRSA colonization surveillance program. It is not intended to diagnose MRSA infection nor to guide or monitor treatment for MRSA infections. Test performance is not FDA approved in patients less than 50 years old. Performed at [facility], [address] [place], [place] [address]          Radiology Studies: No results found.      Scheduled Meds:  arformoterol  15 mcg Nebulization BID   budesonide (PULMICORT) nebulizer solution  0.5 mg Nebulization BID   Chlorhexidine Gluconate Cloth  6 each Topical Q0600   docusate sodium  100 mg Oral BID   heparin  5,000 Units Subcutaneous Q8H   insulin aspart  0-9 Units Subcutaneous Q4H   methylPREDNISolone (SOLU-MEDROL) injection  60 mg Intravenous Q12H   metoprolol succinate  50 mg Oral Daily   mouth rinse  15 mL Mouth Rinse 4 times per day   polyethylene glycol  17 g Oral Daily   revefenacin  175 mcg Nebulization Daily   sevelamer carbonate  1,600 mg Oral TID WC   thiamine  100 mg Oral Daily   Continuous Infusions:  sodium chloride Stopped (08/10/23 1937)   anticoagulant sodium citrate     ceFEPime (MAXIPIME) IV Stopped (08/12/23 2049)     LOS: 3 days     Time spent: 35 minutes.     Alba Cory, MD [facility]   If 7PM-7AM, please contact night-coverage [email or web address]  08/13/2023, 7:02 AM

## 2023-08-13 NOTE — Progress Notes (Signed)
[facility] Progress Note  Subjective: 2.4 L off w/ HD yesterday. BP's up today. Extubated early this am. BP's high, no pressors.   Vitals:   08/13/23 0930 08/13/23 1000 08/13/23 1022 08/13/23 1124  BP: (!) 145/62  (!) 159/62 (!) 162/69  Pulse: 78 82 77 80  Resp: 20 20 20 20   Temp:   97.8 F (36.6 C) 97.8 F (36.6 C)  TempSrc:   Oral Oral  SpO2: 97% (!) 88% 90% 91%  Weight:      Height:        Exam: Extubated and alert, [place] O2 No jvd Chest - CTA bilat Cor reg no mrg Abd soft, ntnd  Ext no edema UE/ LE"s   LUA AVF+ bruit     OP HD: [facility] TTS  3h   73.3kg   400/600  2/2 bath  Heparin 2500  LUA AVF Hectorol 2  Last Mircera 7/2    9/29 CXR - R lower lung atx vs pna, no edema   Assessment/Plan: ESRD - rapid decline in renal function with negative serologic w/u eventually started on dialysis and is currently at [facility] TTS. Left at his EDW on Saturday despite a shortened treatment (2hr 55). Pt got HD here yesterday w/ 2.4 L off. Next HD is today off schedule. Should get HD tomorrow. If ready for dc tomorrow he could possibly go to his OP HD unit.  UTI/ sepsis - UCx grew GNR. Per pmd IV abx adjusted.  Acute on chronic resp failure - possibly vol component, better off vent. Hx of COPD.  Volume - not vol overloaded on exam. Got 2 L off w/ HD yesterday despite pt being under dry wt. Try to further lower dry wt today 1-2 L goal. We had him down to 70kg which should be our goal new dry wt then.  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 HTN - resume home regimen. 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 Anemia of chronic disease - last received Mircera 60 on 7/2 and Hb was on 10.7 on 9/26. Chronic resp failure - due to OSA + COPD     [name] MD  [facility] 08/13/2023, 1:39 PM  Recent Labs  Lab 08/10/23 1559 08/10/23 1611 08/12/23 0750  08/12/23 1651 08/13/23 0803  HGB 10.8*   < > 10.0*  --  10.3*  ALBUMIN 3.3*  --   --   --   --   CALCIUM 8.6*   < > 8.7*  --  8.5*  PHOS  --    < > 6.5* 7.4*  --   CREATININE 5.65*   < > 5.13*  --  7.02*  K 5.3*   < > 5.1  --  5.9*   < > = values in this interval not displayed.   No results for input(s): "IRON", "TIBC", "FERRITIN" in the last 168 hours. Inpatient medications:  arformoterol  15 mcg Nebulization BID   budesonide (PULMICORT) nebulizer solution  0.5 mg Nebulization BID   Chlorhexidine Gluconate Cloth  6 each Topical Q0600   docusate sodium  100 mg Oral BID   [START ON 08/14/2023] heparin  2,000 Units Dialysis Once in dialysis   heparin  5,000 Units Subcutaneous Q8H   insulin aspart  0-9 Units Subcutaneous Q4H   methylPREDNISolone (SOLU-MEDROL) injection  60 mg Intravenous Q12H   metoprolol succinate  50 mg Oral Daily  multivitamin  1 tablet Oral QHS   mouth rinse  15 mL Mouth Rinse 4 times per day   polyethylene glycol  17 g Oral Daily   revefenacin  175 mcg Nebulization Daily   sevelamer carbonate  1,600 mg Oral TID WC   thiamine  100 mg Oral Daily    sodium chloride Stopped (08/10/23 1937)   anticoagulant sodium citrate     anticoagulant sodium citrate     ceFEPime (MAXIPIME) IV Stopped (08/12/23 2049)   alteplase, alteplase, anticoagulant sodium citrate, anticoagulant sodium citrate, feeding supplement (NEPRO CARB STEADY), heparin, heparin, [START ON 08/14/2023] heparin, hydrALAZINE, labetalol, lidocaine (PF), lidocaine (PF), lidocaine-prilocaine, lidocaine-prilocaine, mouth rinse, pentafluoroprop-tetrafluoroeth, pentafluoroprop-tetrafluoroeth

## 2023-08-13 NOTE — Progress Notes (Signed)
Inpatient Rehab Admissions Coordinator Note:   Per therapy recommendations patient was screened for CIR candidacy by Stephania Fragmin, PT. At this time, pt appears to be a potential candidate for CIR. I will place an order for rehab consult for full assessment, per our protocol.  Please contact me any with questions.Estill Dooms, PT, DPT [phone] 08/13/23 10:54 AM

## 2023-08-13 NOTE — Progress Notes (Signed)
Pt receives out-pt HD at [facility] on TTS. Will assist as needed.   Olivia Canter Renal Navigator [phone]

## 2023-08-13 NOTE — Progress Notes (Signed)
Speech Language Pathology Treatment: Dysphagia  Patient Details Name: Jeremy [name] MRN: [id number] DOB: 08-22-1941 Today's Date: 08/13/2023 Time: 0902-0909 SLP Time Calculation (min) (ACUTE ONLY): 7 min  Assessment / Plan / Recommendation Clinical Impression  Pt continues to present with clinical indicators of pharyngeal dysphagia.  There is consistent cough with thin liquid.  Pt required double swallow with nectar thick liquids, but no immediate cough or wet vocal quality.  Pt tolerated puree and regular solid without overt s/s of aspiration and exhibited good oral clearance.  Pt continues to have some wet vocal quality intermittently in absence of POs which is presumed to be related to secretions.  RN reports having to increase O2 overnight. She has also been thickening liquids to HTL with subjective improvement.  Recommend instrumental swallow evaluation for further assessment of pharyngeal swallow function.  Recommend continuing regular diet with nectar thick liquid pending results of MBS. Pt may have ice chips and small amounts of water in between meals only, in moderation, after good oral care, when fully awake/alert, with upright positioning and direct supervision.     HPI HPI: [name] is an 82 y/o gentleman who presented to the hospital for being somnolent today. His wife could barely wake him up earlier and decided to bring him to the ED. When she checked his pulse ox at home he had saturations in the 50s (not on his oxygen). Pt required intubation 9/29-10/1. CXR 9/29: "Right lower lobe atelectasis or pneumonia." Pt with a history of smoking, ESRD on HD TThS.      SLP Plan  MBS      Recommendations for follow up therapy are one component of a multi-disciplinary discharge planning process, led by the attending physician.  Recommendations may be updated based on patient status, additional functional criteria and insurance authorization.    Recommendations  Diet recommendations:  Regular;Nectar-thick liquid Medication Administration: Whole meds with liquid Compensations: Slow rate;Small sips/bites Postural Changes and/or Swallow Maneuvers: Seated upright 90 degrees                  Oral care BID;Oral care prior to ice chip/H20     Dysphagia, unspecified (R13.10)     MBS     Kerrie Pleasure, MA, CCC-SLP Acute Rehabilitation Services Office: [phone] 08/13/2023, 9:17 AM

## 2023-08-13 NOTE — Progress Notes (Signed)
OT Cancellation Note  Patient Details Name: Jeremy [name] MRN: [id number] DOB: 1941/07/23   Cancelled Treatment:    Reason Eval/Treat Not Completed: Patient at procedure or test/ unavailable (HD)  Evern Bio 08/13/2023, 3:26 PM Berna Spare, OTR/L Acute Rehabilitation Services Office: [phone]

## 2023-08-13 NOTE — Progress Notes (Signed)
TRH night cross cover note:   As this patient is now out of the ICU, I've changed his CBG/SSI orders from Q4 to AC/HS. Most recent CBG result noted to 145 at 2130 this evening.     Newton Pigg, DO Hospitalist

## 2023-08-13 NOTE — Progress Notes (Signed)
Patient has refused Cpap for the night. States he can't sleep with it on and doesn't use one at home. Pt states he is fine with just his O2. This RT advised pt that if he changes his mind or has any trouble, to let his RN know.

## 2023-08-13 NOTE — Progress Notes (Signed)
Physical Therapy Treatment Patient Details Name: Jeremy [name] MRN: [id number] DOB: [birth year] Today's Date: 08/13/2023   History of Present Illness 82 y.o. male presents to [facility] hospital on 08/10/2023 with somnolence. Pt found to be hypoxic. Concern for possible sepsis 2/2 UTI. Pt required intubation on 9/29, extubated on 10/1. PMH includes chronic respiratory failure, ESRD, tobacco abuse, OSA, HTN, HLD.    PT Comments  Pt received in bed, eager and motivated to participate in therapy. He required min assist bed mobility, min assist transfers, and min assist amb 5' with RW. Pt on 5L continuous O2 with SpO2 88-91%. 2/4 DOE. Cues for pursed lip breathing. Pt performed BLE exercises in recliner. Pt in recliner with feet elevated at end of session.     If plan is discharge home, recommend the following: A lot of help with walking and/or transfers;A lot of help with bathing/dressing/bathroom;Assistance with cooking/housework;Assist for transportation;Help with stairs or ramp for entrance   Can travel by private vehicle        Equipment Recommendations  Rolling walker (2 wheels);BSC/3in1    Recommendations for Other Services Rehab consult     Precautions / Restrictions Precautions Precautions: Fall;Other (comment) Precaution Comments: watch sats Restrictions Weight Bearing Restrictions: No     Mobility  Bed Mobility Overal bed mobility: Needs Assistance Bed Mobility: Supine to Sit     Supine to sit: Min assist, HOB elevated, Used rails     General bed mobility comments: assist to elevate trunk, use of bed pad to scoot to EOB    Transfers Overall transfer level: Needs assistance Equipment used: Rolling walker (2 wheels) Transfers: Sit to/from Stand Sit to Stand: Min assist           General transfer comment: assist to power up and stabilize balance    Ambulation/Gait Ambulation/Gait assistance: Min assist Gait Distance (Feet): 5 Feet Assistive device: Rolling walker  (2 wheels) Gait Pattern/deviations: Step-through pattern, Decreased stride length Gait velocity: decreased     General Gait Details: unsteady gait, assist to maintain balance and manage RW   Stairs             Wheelchair Mobility     Tilt Bed    Modified Rankin (Stroke Patients Only)       Balance Overall balance assessment: Needs assistance Sitting-balance support: No upper extremity supported, Feet supported Sitting balance-Leahy Scale: Fair     Standing balance support: Bilateral upper extremity supported, Reliant on assistive device for balance, During functional activity Standing balance-Leahy Scale: Poor                              Cognition Arousal: Alert Behavior During Therapy: WFL for tasks assessed/performed Overall Cognitive Status: Impaired/Different from baseline Area of Impairment: Memory, Safety/judgement, Awareness, Problem solving                     Memory: Decreased short-term memory   Safety/Judgement: Decreased awareness of safety, Decreased awareness of deficits Awareness: Emergent Problem Solving: Slow processing, Requires verbal cues          Exercises General Exercises - Lower Extremity Ankle Circles/Pumps: AROM, Both, 10 reps, Seated Long Arc Quad: AROM, Right, Left, 10 reps, Seated Hip ABduction/ADduction: AROM, Both, 10 reps, Seated Hip Flexion/Marching: AROM, Right, Left, 10 reps, Seated    General Comments General comments (skin integrity, edema, etc.): Pt on 5L continuous O2 with SpO2 88-91%. Cues for pursed lip breathing.  Pertinent Vitals/Pain Pain Assessment Pain Assessment: No/denies pain    Home Living                          Prior Function            PT Goals (current goals can now be found in the care plan section) Acute Rehab PT Goals Patient Stated Goal: to return to independence in mobility, golfing Progress towards PT goals: Progressing toward goals     Frequency    Min 1X/week      PT Plan      Co-evaluation              AM-PAC PT "6 Clicks" Mobility   Outcome Measure  Help needed turning from your back to your side while in a flat bed without using bedrails?: A Little Help needed moving from lying on your back to sitting on the side of a flat bed without using bedrails?: A Little Help needed moving to and from a bed to a chair (including a wheelchair)?: A Little Help needed standing up from a chair using your arms (e.g., wheelchair or bedside chair)?: A Little Help needed to walk in hospital room?: A Lot Help needed climbing 3-5 steps with a railing? : Total 6 Click Score: 15    End of Session Equipment Utilized During Treatment: Oxygen;Gait belt Activity Tolerance: Patient tolerated treatment well Patient left: in chair;with call bell/phone within reach;with chair alarm set Nurse Communication: Mobility status PT Visit Diagnosis: Other abnormalities of gait and mobility (R26.89);Muscle weakness (generalized) (M62.81)     Time: 1610-9604 PT Time Calculation (min) (ACUTE ONLY): 26 min  Charges:    $Gait Training: 8-22 mins $Therapeutic Exercise: 8-22 mins PT General Charges $$ ACUTE PT VISIT: 1 Visit                     Ferd Glassing., PT  Office # [phone]    Ilda Foil 08/13/2023, 9:46 AM

## 2023-08-14 ENCOUNTER — Encounter (HOSPITAL_COMMUNITY): Payer: Self-pay | Admitting: Critical Care Medicine

## 2023-08-14 DIAGNOSIS — J9622 Acute and chronic respiratory failure with hypercapnia: Secondary | ICD-10-CM | POA: Diagnosis not present

## 2023-08-14 LAB — RENAL FUNCTION PANEL
Albumin: 3 g/dL — ABNORMAL LOW (ref 3.5–5.0)
Anion gap: 15 (ref 5–15)
BUN: 55 mg/dL — ABNORMAL HIGH (ref 8–23)
CO2: 24 mmol/L (ref 22–32)
Calcium: 8.3 mg/dL — ABNORMAL LOW (ref 8.9–10.3)
Chloride: 94 mmol/L — ABNORMAL LOW (ref 98–111)
Creatinine, Ser: 4.39 mg/dL — ABNORMAL HIGH (ref 0.61–1.24)
GFR, Estimated: 13 mL/min — ABNORMAL LOW (ref >60)
Glucose, Bld: 117 mg/dL — ABNORMAL HIGH (ref 70–99)
Phosphorus: 4.6 mg/dL (ref 2.5–4.6)
Potassium: 5.4 mmol/L — ABNORMAL HIGH (ref 3.5–5.1)
Sodium: 133 mmol/L — ABNORMAL LOW (ref 135–145)

## 2023-08-14 LAB — CBC
HCT: 33.7 % — ABNORMAL LOW (ref 39.0–52.0)
Hemoglobin: 11 g/dL — ABNORMAL LOW (ref 13.0–17.0)
MCH: 32 pg (ref 26.0–34.0)
MCHC: 32.6 g/dL (ref 30.0–36.0)
MCV: 98 fL (ref 80.0–100.0)
Platelets: 263 10*3/uL (ref 150–400)
RBC: 3.44 MIL/uL — ABNORMAL LOW (ref 4.22–5.81)
RDW: 14.4 % (ref 11.5–15.5)
WBC: 10.4 10*3/uL (ref 4.0–10.5)
nRBC: 0 % (ref 0.0–0.2)

## 2023-08-14 LAB — BLOOD GAS, ARTERIAL
Acid-Base Excess: 2.1 mmol/L — ABNORMAL HIGH (ref 0.0–2.0)
Bicarbonate: 28.7 mmol/L — ABNORMAL HIGH (ref 20.0–28.0)
Drawn by: 42783
O2 Saturation: 93.4 %
Patient temperature: 36.4
pCO2 arterial: 51 mm[Hg] — ABNORMAL HIGH (ref 32–48)
pH, Arterial: 7.36 (ref 7.35–7.45)
pO2, Arterial: 62 mm[Hg] — ABNORMAL LOW (ref 83–108)

## 2023-08-14 LAB — GLUCOSE, CAPILLARY
Glucose-Capillary: 128 mg/dL — ABNORMAL HIGH (ref 70–99)
Glucose-Capillary: 173 mg/dL — ABNORMAL HIGH (ref 70–99)
Glucose-Capillary: 96 mg/dL (ref 70–99)

## 2023-08-14 MED ORDER — ANTICOAGULANT SODIUM CITRATE 4% (200MG/5ML) IV SOLN
5.0000 mL | Status: DC | PRN
  Filled 2023-08-14: qty 5

## 2023-08-14 MED ORDER — METOPROLOL TARTRATE 25 MG PO TABS
25.0000 mg | ORAL_TABLET | Freq: Two times a day (BID) | ORAL | Status: DC
  Administered 2023-08-14 – 2023-08-18 (×7): 25 mg via ORAL
  Filled 2023-08-14 (×8): qty 1

## 2023-08-14 MED ORDER — POLYETHYLENE GLYCOL 3350 17 G PO PACK
17.0000 g | PACK | Freq: Once | ORAL | Status: AC
  Administered 2023-08-14: 17 g via ORAL

## 2023-08-14 MED ORDER — CIPROFLOXACIN HCL 500 MG PO TABS
500.0000 mg | ORAL_TABLET | Freq: Every day | ORAL | Status: AC
  Administered 2023-08-15 – 2023-08-17 (×3): 500 mg via ORAL
  Filled 2023-08-14 (×3): qty 1

## 2023-08-14 MED ORDER — HEPARIN SODIUM (PORCINE) 1000 UNIT/ML DIALYSIS
2000.0000 [IU] | Freq: Once | INTRAMUSCULAR | Status: AC
  Administered 2023-08-14: 2000 [IU] via INTRAVENOUS_CENTRAL
  Filled 2023-08-14: qty 2

## 2023-08-14 MED ORDER — LIDOCAINE HCL (PF) 1 % IJ SOLN: 5.0000 mL | INTRAMUSCULAR | Status: DC | PRN

## 2023-08-14 MED ORDER — METHYLPREDNISOLONE SODIUM SUCC 125 MG IJ SOLR
125.0000 mg | Freq: Every day | INTRAMUSCULAR | Status: DC
  Administered 2023-08-14: 125 mg via INTRAVENOUS
  Filled 2023-08-14: qty 2

## 2023-08-14 MED ORDER — CHLORHEXIDINE GLUCONATE CLOTH 2 % EX PADS: 6.0000 | MEDICATED_PAD | Freq: Every day | CUTANEOUS | Status: DC

## 2023-08-14 MED ORDER — PREDNISONE 20 MG PO TABS
40.0000 mg | ORAL_TABLET | Freq: Every day | ORAL | Status: AC
  Administered 2023-08-15 – 2023-08-17 (×3): 40 mg via ORAL
  Filled 2023-08-14 (×3): qty 2

## 2023-08-14 MED ORDER — DOCUSATE SODIUM 50 MG/5ML PO LIQD
100.0000 mg | Freq: Two times a day (BID) | ORAL | Status: DC
  Administered 2023-08-15 – 2023-08-18 (×3): 100 mg via ORAL
  Filled 2023-08-14 (×14): qty 10

## 2023-08-14 MED ORDER — HEPARIN SODIUM (PORCINE) 1000 UNIT/ML DIALYSIS: 1000.0000 [IU] | INTRAMUSCULAR | Status: DC | PRN

## 2023-08-14 MED ORDER — GUAIFENESIN ER 600 MG PO TB12
600.0000 mg | ORAL_TABLET | Freq: Two times a day (BID) | ORAL | Status: DC
  Administered 2023-08-15 – 2023-08-17 (×4): 600 mg via ORAL
  Filled 2023-08-14 (×4): qty 1

## 2023-08-14 MED ORDER — LIDOCAINE-PRILOCAINE 2.5-2.5 % EX CREA: 1.0000 | TOPICAL_CREAM | CUTANEOUS | Status: DC | PRN

## 2023-08-14 MED ORDER — DOCUSATE SODIUM 50 MG/5ML PO LIQD
100.0000 mg | Freq: Once | ORAL | Status: AC
  Administered 2023-08-14: 100 mg via ORAL
  Filled 2023-08-14: qty 10

## 2023-08-14 MED ORDER — NEPRO/CARBSTEADY PO LIQD: 237.0000 mL | ORAL | Status: DC | PRN

## 2023-08-14 MED ORDER — ALTEPLASE 2 MG IJ SOLR: 2.0000 mg | Freq: Once | INTRAMUSCULAR | Status: DC | PRN

## 2023-08-14 MED ORDER — PENTAFLUOROPROP-TETRAFLUOROETH EX AERO: 1.0000 | INHALATION_SPRAY | CUTANEOUS | Status: DC | PRN

## 2023-08-14 MED ORDER — SEVELAMER CARBONATE 0.8 G PO PACK
1.6000 g | PACK | Freq: Three times a day (TID) | ORAL | Status: DC
  Administered 2023-08-14 – 2023-08-18 (×11): 1.6 g via ORAL
  Filled 2023-08-14 (×17): qty 2

## 2023-08-14 NOTE — TOC Initial Note (Signed)
Transition of Care [facility]) - Initial/Assessment Note    Patient Details  Name: Jeremy [name] MRN: [id number] Date of Birth: [birth year]  [facility] of [facility] CM/SW Contact:    Gala Lewandowsky, RN Phone Number: 08/14/2023, 5:01 PM  Clinical Narrative: Patient presented for hypoxia. Transfer from 64M. PTA patient was from home with spouse. Case Manager received a message from the MD that the spouse has questions regarding inpatient rehab. [facility]. Case Manager spoke with spouse and is agreeable to have Case Manager fax out to [facility]. Spouse had questions regarding SNF. CSW to follow with spouse. Case Manager will follow up with patient as he progresses.                Expected Discharge Plan: IP Rehab Facility Barriers to Discharge: Continued Medical Work up   Expected Discharge Plan and Services   Discharge Planning Services: CM Consult   Living arrangements for the past 2 months: Single Family Home      Prior Living Arrangements/Services Living arrangements for the past 2 months: Single Family Home Lives with:: Spouse Patient language and need for interpreter reviewed:: Yes        Need for Family Participation in Patient Care: Yes (Comment) Care giver support system in place?: Yes (comment)   Activities of Daily Living   ADL Screening (condition at time of admission) Independently performs ADLs?: No Does the patient have a NEW difficulty with bathing/dressing/toileting/self-feeding that is expected to last >3 days?: No Does the patient have a NEW difficulty with getting in/out of bed, walking, or climbing stairs that is expected to last >3 days?: No Does the patient have a NEW difficulty with communication that is expected to last >3 days?: No Is the patient deaf or have difficulty hearing?: Yes Does the patient have difficulty seeing, even when wearing glasses/contacts?: No Does the patient have difficulty  concentrating, remembering, or making decisions?: No  Permission Sought/Granted Permission sought to share information with : Case Manager, Family Supports   Emotional Assessment Appearance:: Appears stated age  Alcohol / Substance Use: Not Applicable Psych Involvement: No (comment)  Admission diagnosis:  Acute respiratory failure with hypoxia and hypercarbia (HCC) [J96.01, J96.02] Patient Active Problem List   Diagnosis Date Noted   Malnutrition of moderate degree 08/13/2023   Hypoxia 08/12/2023   Acute on chronic respiratory failure with hypercapnia (HCC) 08/12/2023   ESRD (end stage renal disease) (HCC) 10/24/2022   Gait instability 10/24/2022   AKI (acute kidney injury) (HCC) 09/12/2022   Lethargy 08/01/2022   Neck pain on right side 04/23/2022   Cellulitis 01/08/2022   Intermediate stage nonexudative age-related macular degeneration of left eye 08/17/2020   Pseudophakia 08/17/2020   Degenerative retinal drusen of left eye 08/17/2020   Advanced nonexudative age-related macular degeneration of right eye with subfoveal involvement 08/17/2020   Senile purpura (HCC) 08/10/2020   Aortic atherosclerosis (HCC) 08/10/2020   Cerumen impaction 08/10/2020   Skin lesion 07/06/2019   Hyperlipidemia 01/05/2019   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   Cigarette smoker 03/17/2018   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   OSA (obstructive sleep apnea) 05/23/2016   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   Osteoarthritis 01/29/2010   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   History of bladder cancer 10/10/2008   Chronic bronchitis (HCC) 10/10/2008   Diverticulosis of large intestine 10/10/2008   COLONIC POLYPS 12/07/2007   Essential hypertension 12/07/2007  Hemorrhoids 12/07/2007   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 [place] [address] Phone: [phone] Fax: [phone]  [id number] - [address] [place] - [address] [place] [address] Phone: [phone] Fax: [phone]   Social Determinants of Health (SDOH) Social History: SDOH Screenings   Food Insecurity: No Food Insecurity (08/14/2023)  Housing: Low Risk  (08/14/2023)  Transportation Needs: No Transportation Needs (08/14/2023)  Utilities: Not At Risk (08/14/2023)  Depression (PHQ2-9): Low Risk  (05/19/2023)  Tobacco Use: High Risk (08/14/2023)   SDOH Interventions:     Readmission Risk Interventions     No data to display

## 2023-08-14 NOTE — Progress Notes (Signed)
[facility] Progress Note  Subjective: 2.5 L off w/ HD yesterday. BP's are good.   Vitals:   08/14/23 0750 08/14/23 0805 08/14/23 1113 08/14/23 1208  BP: (!) 172/67  (!) 142/60 (!) 153/62  Pulse: 78 80 87 86  Resp: 20 19  20   Temp: 97.7 F (36.5 C)   97.6 F (36.4 C)  TempSrc: Oral   Oral  SpO2: 95% 97%  99%  Weight:      Height:        Exam: Extubated and alert, [place] O2 No jvd Chest - CTA bilat Cor reg no mrg Abd soft, ntnd  Ext no edema UE/ LE"s   LUA AVF+ bruit     OP HD: [facility] TTS  3h   73.3kg   400/600  2/2 bath  Heparin 2500  LUA AVF Hectorol 2  Last Mircera 7/2    9/29 CXR - R lower lung atx vs pna, no edema   Assessment/Plan: ESRD - on HD TTS. Left at his EDW last Saturday despite a shortened treatment.  Pt got HD here in the hospital on Monday and Wed w/ 4.5 L UF total.  Will try to get a short HD in today/ tonight to get back on TTS schedule.  Sepsis / UTI/ PNA- UCx grew enterobacter and sputum cx grew pseudomonas. Getting IV cefepime.  Acute on chronic resp failure - prob pna and volume components. Off the vent tuesday. Hx of COPD.  Volume - not vol overloaded on exam. 4.5 L off here after HD x 2. Lowest wt 69.5kg. New dry wt should be around 70kg.  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 HTN - resume home regimen. 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 Anemia of chronic disease - last received Mircera 60 on 7/2 and Hb was on 10.7 on 9/26. Chronic resp failure - due to OSA + COPD     Jeremy Moselle MD  [facility] 08/14/2023, 3:38 PM  Recent Labs  Lab 08/10/23 1559 08/10/23 1611 08/12/23 1651 08/13/23 0803 08/14/23 0425  HGB 10.8*   < >  --  10.3* 11.0*  ALBUMIN 3.3*  --   --   --  3.0*  CALCIUM 8.6*   < >  --  8.5* 8.3*  PHOS  --    < > 7.4*  --  4.6  CREATININE 5.65*   < >  --  7.02* 4.39*  K 5.3*   < >  --  5.9* 5.4*    < > = values in this interval not displayed.   No results for input(s): "IRON", "TIBC", "FERRITIN" in the last 168 hours. Inpatient medications:  arformoterol  15 mcg Nebulization BID   budesonide (PULMICORT) nebulizer solution  0.5 mg Nebulization BID   docusate  100 mg Oral BID   heparin  5,000 Units Subcutaneous Q8H   insulin aspart  0-9 Units Subcutaneous TID WC   methylPREDNISolone (SOLU-MEDROL) injection  125 mg Intravenous Daily   metoprolol tartrate  25 mg Oral BID   multivitamin  1 tablet Oral QHS   mouth rinse  15 mL Mouth Rinse 4 times per day   polyethylene glycol  17 g Oral Daily   revefenacin  175 mcg Nebulization Daily   sevelamer carbonate  1.6 g Oral TID WC   thiamine  100 mg Oral Daily    sodium chloride Stopped (08/10/23 1937)  ceFEPime (MAXIPIME) IV 1 g (08/13/23 2324)   hydrALAZINE, labetalol, mouth rinse

## 2023-08-14 NOTE — Progress Notes (Signed)
  Inpatient Rehabilitation Admissions Coordinator   Met with patient and wife at bedside for rehab assessment. We discussed goals and expectations of a possible CIR admit. They prefer direct d/c home with [facility] or OP therapy for rehab. She feels he does better at home with mobilizing. Family can provide expected caregiver support that is recommended of supervision level.I discussed that she would have to let us know if she felt he was mobilizing well enough for direct d/c home. CIR bed would not be available to admit him into sometime next week due to limited beds available this week. Please call me with any questions.   Ottie Glazier, RN, MSN Rehab Admissions Coordinator [phone]

## 2023-08-14 NOTE — Progress Notes (Addendum)
CSW informed by CM patients spouse agreeable for CSW to fax out for SNF to review possible SNF bed offers received, to see if she wants to pursue SNF as back up plan to inpatient rehab. CSW faxed patient out for SNF. CSW will continue to follow.

## 2023-08-14 NOTE — Progress Notes (Addendum)
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  He completed hemodialysis the day prior to admission.  He was noted to be coughing more.  He continues to smoke 1 pack/day.  Patient failed a trial on BiPAP and he was intubated on 9/29.  He was admitted with acute metabolic encephalopathy secondary to acidosis, shock, sepsis secondary to urinary tract infection versus pneumonia.  He required IV pressors and admitted by CCM.  He was  extubated 10/01. Off pressors.  Care transferred to triad 10/02.     Assessment & Plan:   Active Problems:   Hypoxia   Acute on chronic respiratory failure with hypercapnia (HCC)   Malnutrition of moderate degree  1-Acute metabolic encephalopathy, in the setting of acidosis, infection, hypoxia, Hypercapnia.  -CT Head: No acute intracranial abnormality -Improving, he is alert and answering questions Addendum: He seems more  confuse this afternoon, suspect delirium. Will change cefepime to Cipro. Will check ABG and will change IV steroids to lower dose prednisone.   Severe Sepsis,  shock required IV pressors -In the setting of pneumonia and UTI -He has been off of pressors -Chest x-ray on 9/29: Showed right lower lobe atelectasis or pneumonia. -Predatory culture growing Pseudomonas -Urine culture grew: Enterbacter Cloacae.  -Continue  with cefepime, plan to treat for 7 days antibiotics.   Acute on chronic hypoxic respiratory failure with hypercapnia OSA not on CPAP -Patient require intubation 9/29 Continue with bronchodilators On IV steroids Respiratory failure could be in the setting of acute pulmonary edema concern he may under dialyzed due to  change in hemodialysis regimen Concern for pneumonia, sputum culture growing Pseudomonas Respiratory toilet, flutter valve, incentive spirometry Oxygen supplementation down to 4 L, wean as tolerated.    ESRD on hemodialysis Hyponatremia: Correction with HD.  Hyperkalemia: Correction with HD.  Anion gap metabolic acidosis Had HD yesterday   Mild Elevation of troponins In the setting of sepsis.  No chest pain.  Hypertension; on metoprolol  Anemia of chronic kidney disease Monitor Hb.   Deconditioning Urine Retention; Continue self catheterization.   Dysphagia;  Evaluated by speech. On dysphagia 3 diet     Nutrition Problem: Moderate Malnutrition Etiology: chronic illness (ESRD/HD, COPD)    Signs/Symptoms: moderate fat depletion, severe muscle depletion    Interventions: Tube feeding, Refer to RD note for recommendations  Estimated body mass index is 23.06 kg/m as calculated from the following:   Height as of this encounter: 5\' 10"  (1.778 m).   Weight as of this encounter: 72.9 kg.   DVT prophylaxis: Heparin  Code Status: Full code Family Communication: care discussed with patient, wife updated over phone 10/03 Disposition Plan:  Status is: Inpatient Remains inpatient appropriate because: management of resp failure    Consultants:  CCM Nephrology   Procedures:  Mechanical intubation.   Antimicrobials:    Subjective: He is feeling better, he would not want to go to rehab, he would prefer to go home.  Cough has improved.   Objective: Vitals:   08/13/23 2358 08/14/23 0438 08/14/23 0750 08/14/23 0805  BP: (!) 158/68 (!) 172/77 (!) 172/67   Pulse:   78 80  Resp: 18 20 20 19   Temp: 98.2 F (36.8  C) 98.7 F (37.1 C) 97.7 F (36.5 C)   TempSrc: Oral Axillary Oral   SpO2:  93% 95% 97%  Weight:  72.9 kg    Height:        Intake/Output Summary (Last 24 hours) at 08/14/2023 1610 Last data filed at 08/13/2023 1829 Gross per 24 hour  Intake --   Output 2.5 ml  Net -2.5 ml   Filed Weights   08/13/23 1421 08/13/23 1836 08/14/23 0438  Weight: 74.7 kg 71.9 kg 72.9 kg    Examination:  General exam: NAD Respiratory system: BL ronchus Cardiovascular system: SS 1 S 2 RRR Gastrointestinal system: BS present, soft, nt Central nervous system: Alert, orineted Extremities: no edema    Data Reviewed: I have personally reviewed following labs and imaging studies  CBC: Recent Labs  Lab 08/10/23 1559 08/10/23 1611 08/10/23 1922 08/11/23 0244 08/12/23 0750 08/13/23 0803 08/14/23 0425  WBC 6.6  --   --  6.4 10.6* 11.3* 10.4  HGB 10.8*   < > 10.9* 10.2* 10.0* 10.3* 11.0*  HCT 34.7*   < > 32.0* 30.8* 30.8* 32.3* 33.7*  MCV 102.7*  --   --  97.5 96.9 96.4 98.0  PLT 268  --   --  251 256 267 263   < > = values in this interval not displayed.   Basic Metabolic Panel: Recent Labs  Lab 08/10/23 1559 08/10/23 1611 08/10/23 1922 08/11/23 0244 08/11/23 1739 08/12/23 0750 08/12/23 1651 08/13/23 0803 08/14/23 0425  NA 137   < > 134* 134*  --  133*  --  132* 133*  K 5.3*   < > 5.5* 4.7  --  5.1  --  5.9* 5.4*  CL 90*  --   --  96*  --  91*  --  93* 94*  CO2 30  --   --  22  --  23  --  22 24  GLUCOSE 102*  --   --  126*  --  133*  --  134* 117*  BUN 32*  --   --  44*  --  50*  --  91* 55*  CREATININE 5.65*  --   --  6.08*  --  5.13*  --  7.02* 4.39*  CALCIUM 8.6*  --   --  8.5*  --  8.7*  --  8.5* 8.3*  MG  --   --   --  2.5* 1.9 2.2 2.3  --   --   PHOS  --   --   --  5.2* 5.2* 6.5* 7.4*  --  4.6   < > = values in this interval not displayed.   GFR: Estimated Creatinine Clearance: 13.1 mL/min (A) (by C-G formula based on SCr of 4.39 mg/dL (H)). Liver Function Tests: Recent Labs  Lab 08/10/23 1559 08/14/23 0425  AST 13*  --   ALT 10  --   ALKPHOS 65  --   BILITOT 0.6  --   PROT 7.2  --   ALBUMIN 3.3* 3.0*   No results for input(s): "LIPASE", "AMYLASE" in the last 168 hours. No results for input(s): "AMMONIA" in  the last 168 hours. Coagulation Profile: No results for input(s): "INR", "PROTIME" in the last 168 hours. Cardiac Enzymes: No results for input(s): "CKTOTAL", "CKMB", "CKMBINDEX", "TROPONINI" in the last 168 hours. BNP (last 3 results) No results for input(s): "PROBNP" in the last 8760 hours. HbA1C: No results for input(s): "HGBA1C" in the last 72 hours.  CBG:  Recent Labs  Lab 08/12/23 2330 08/13/23 0344 08/13/23 0812 08/13/23 1127 08/13/23 2129  GLUCAP 131* 118* 149* 161* 145*   Lipid Profile: No results for input(s): "CHOL", "HDL", "LDLCALC", "TRIG", "CHOLHDL", "LDLDIRECT" in the last 72 hours. Thyroid Function Tests: No results for input(s): "TSH", "T4TOTAL", "FREET4", "T3FREE", "THYROIDAB" in the last 72 hours. Anemia Panel: No results for input(s): "VITAMINB12", "FOLATE", "FERRITIN", "TIBC", "IRON", "RETICCTPCT" in the last 72 hours. Sepsis Labs: Recent Labs  Lab 08/10/23 1713 08/10/23 2326 08/11/23 0841 08/11/23 1519  LATICACIDVEN 0.8 2.1* 1.3 1.2    Recent Results (from the past 240 hour(s))  SARS Coronavirus 2 by RT PCR (hospital order, performed in [facility] hospital lab) *cepheid single result test* Anterior Nasal Swab     Status: None   Collection Time: 08/10/23  4:08 PM   Specimen: Anterior Nasal Swab  Result Value Ref Range Status   SARS Coronavirus 2 by RT PCR NEGATIVE NEGATIVE Final    Comment: Performed at [facility], [address] [place], [place] [address]  Blood culture (routine x 2)     Status: None (Preliminary result)   Collection Time: 08/10/23  5:18 PM   Specimen: BLOOD RIGHT ARM  Result Value Ref Range Status   Specimen Description BLOOD RIGHT ARM  Final   Special Requests   Final    BOTTLES DRAWN AEROBIC AND ANAEROBIC Blood Culture adequate volume   Culture   Final    NO GROWTH 3 DAYS Performed at [facility], [address], [place], [place] [address]    Report Status PENDING  Incomplete  Blood culture (routine x 2)      Status: None (Preliminary result)   Collection Time: 08/10/23  5:47 PM   Specimen: BLOOD RIGHT ARM  Result Value Ref Range Status   Specimen Description BLOOD RIGHT ARM  Final   Special Requests   Final    BOTTLES DRAWN AEROBIC AND ANAEROBIC Blood Culture adequate volume   Culture   Final    NO GROWTH 3 DAYS Performed at [facility], [address] [place], [place] [address]    Report Status PENDING  Incomplete  Urine Culture (for pregnant, neutropenic or urologic patients or patients with an indwelling urinary catheter)     Status: Abnormal   Collection Time: 08/10/23  6:16 PM   Specimen: In/Out Cath Urine  Result Value Ref Range Status   Specimen Description IN/OUT CATH URINE  Final   Special Requests   Final    NONE Performed at [facility], [address], [place], [place] [address]    Culture >=100,000 COLONIES/mL ENTEROBACTER CLOACAE (A)  Final   Report Status 08/13/2023 FINAL  Final   Organism ID, Bacteria ENTEROBACTER CLOACAE (A)  Final      Susceptibility   Enterobacter cloacae - MIC*    CEFEPIME <=0.12 SENSITIVE Sensitive     CIPROFLOXACIN <=0.25 SENSITIVE Sensitive     GENTAMICIN <=1 SENSITIVE Sensitive     IMIPENEM 1 SENSITIVE Sensitive     NITROFURANTOIN 32 SENSITIVE Sensitive     TRIMETH/SULFA <=20 SENSITIVE Sensitive     PIP/TAZO <=4 SENSITIVE Sensitive     * >=100,000 COLONIES/mL ENTEROBACTER CLOACAE  Culture, Respiratory w Gram Stain     Status: None   Collection Time: 08/11/23  8:07 AM   Specimen: Tracheal Aspirate; Respiratory  Result Value Ref Range Status   Specimen Description TRACHEAL ASPIRATE  Final   Special Requests NONE  Final  Gram Stain   Final    FEW WBC PRESENT, PREDOMINANTLY PMN ABUNDANT GRAM POSITIVE COCCI IN CLUSTERS FEW GRAM POSITIVE COCCI IN CHAINS FEW GRAM POSITIVE RODS Performed at [facility], [address], [place], [place] [address]    Culture FEW PSEUDOMONAS AERUGINOSA  Final   Report Status  08/13/2023 FINAL  Final   Organism ID, Bacteria PSEUDOMONAS AERUGINOSA  Final      Susceptibility   Pseudomonas aeruginosa - MIC*    CEFTAZIDIME 4 SENSITIVE Sensitive     CIPROFLOXACIN <=0.25 SENSITIVE Sensitive     GENTAMICIN 2 SENSITIVE Sensitive     IMIPENEM 1 SENSITIVE Sensitive     PIP/TAZO 8 SENSITIVE Sensitive     CEFEPIME 2 SENSITIVE Sensitive     * FEW PSEUDOMONAS AERUGINOSA  Respiratory (~20 pathogens) panel by PCR     Status: None   Collection Time: 08/11/23  6:50 PM  Result Value Ref Range Status   Adenovirus NOT DETECTED NOT DETECTED Final   Coronavirus 229E NOT DETECTED NOT DETECTED Final    Comment: (NOTE) The Coronavirus on the Respiratory Panel, DOES NOT test for the novel  Coronavirus (2019 nCoV)    Coronavirus HKU1 NOT DETECTED NOT DETECTED Final   Coronavirus NL63 NOT DETECTED NOT DETECTED Final   Coronavirus OC43 NOT DETECTED NOT DETECTED Final   Metapneumovirus NOT DETECTED NOT DETECTED Final   Rhinovirus / Enterovirus NOT DETECTED NOT DETECTED Final   Influenza A NOT DETECTED NOT DETECTED Final   Influenza B NOT DETECTED NOT DETECTED Final   Parainfluenza Virus 1 NOT DETECTED NOT DETECTED Final   Parainfluenza Virus 2 NOT DETECTED NOT DETECTED Final   Parainfluenza Virus 3 NOT DETECTED NOT DETECTED Final   Parainfluenza Virus 4 NOT DETECTED NOT DETECTED Final   Respiratory Syncytial Virus NOT DETECTED NOT DETECTED Final   Bordetella pertussis NOT DETECTED NOT DETECTED Final   Bordetella Parapertussis NOT DETECTED NOT DETECTED Final   Chlamydophila pneumoniae NOT DETECTED NOT DETECTED Final   Mycoplasma pneumoniae NOT DETECTED NOT DETECTED Final    Comment: Performed at [facility], [address], [place], [place] [address]  MRSA Next Gen by PCR, Nasal     Status: None   Collection Time: 08/12/23 10:12 AM   Specimen: Nasal Mucosa; Nasal Swab  Result Value Ref Range Status   MRSA by PCR Next Gen NOT DETECTED NOT DETECTED Final    Comment:  (NOTE) The GeneXpert MRSA Assay (FDA approved for NASAL specimens only), is one component of a comprehensive MRSA colonization surveillance program. It is not intended to diagnose MRSA infection nor to guide or monitor treatment for MRSA infections. Test performance is not FDA approved in patients less than 42 years old. Performed at [facility], [address], [place], [place] [address]          Radiology Studies: No results found.      Scheduled Meds:  arformoterol  15 mcg Nebulization BID   budesonide (PULMICORT) nebulizer solution  0.5 mg Nebulization BID   docusate  100 mg Oral BID   heparin  5,000 Units Subcutaneous Q8H   insulin aspart  0-9 Units Subcutaneous TID WC   methylPREDNISolone (SOLU-MEDROL) injection  60 mg Intravenous Q12H   metoprolol succinate  50 mg Oral Daily   multivitamin  1 tablet Oral QHS   mouth rinse  15 mL Mouth Rinse 4 times per day   polyethylene glycol  17 g Oral Daily   revefenacin  175 mcg Nebulization Daily   sevelamer carbonate  1.6 g Oral TID WC   thiamine  100 mg Oral Daily   Continuous Infusions:  sodium chloride Stopped (08/10/23 1937)   ceFEPime (MAXIPIME) IV 1 g (08/13/23 2324)     LOS: 4 days    Time spent: 35 minutes.     Alba Cory, MD [facility]   If 7PM-7AM, please contact night-coverage [email or web address]  08/14/2023, 9:03 AM

## 2023-08-14 NOTE — Progress Notes (Signed)
Speech Language Pathology Treatment: Dysphagia  Patient Details Name: KRITHIK MAPEL MRN: [id number] DOB: August 27, 1941 Today's Date: 08/14/2023 Time: 1230-1250 SLP Time Calculation (min) (ACUTE ONLY): 20 min  Assessment / Plan / Recommendation Clinical Impression  Pt seen for f/u dysphagia tx with current diet with multiple swallows, wet vocal quality noted with slightly impulsive intake without min verbal cues for chin tuck, slow rate and small bites/sips.  Wife present for session with education provided to pt/wife regarding dysphagia tx/strategies recommended to participate in during all oral intake.  Pt required min verbal cues to decrease impulsivity and complete strategies (ie: chin tuck, effortful swallow) effectively.  Recommend continue current diet of Dysphagia 1/pudding thick liquids d/t overt s/sx of aspiration noted during intake.  Recommend ST f/u during acute stay and at home if [facility] is initiated or with OP SLP to address dysphagia tx/management once discharged.  HPI HPI: Mr. Mccrystal is an 82 y/o gentleman who presented to the hospital for being somnolent today. His wife could barely wake him up earlier and decided to bring him to the ED. When she checked his pulse ox at home he had saturations in the 50s (not on his oxygen). Pt required intubation 9/29-10/1. CXR 9/29: "Right lower lobe atelectasis or pneumonia." Pt with a history of smoking, ESRD on HD TThS; MBS completed with Dysphagia 1/pudding thick liquids recommended. ST f/u for diet progression/tolerance/dysphagia tx.      SLP Plan  Continue with current plan of care      Recommendations for follow up therapy are one component of a multi-disciplinary discharge planning process, led by the attending physician.  Recommendations may be updated based on patient status, additional functional criteria and insurance authorization.    Recommendations  Diet recommendations: Pudding-thick liquid;Dysphagia 1 (puree) Liquids provided via:  Teaspoon Medication Administration: Whole meds with puree Supervision: Patient able to self feed;Intermittent supervision to cue for compensatory strategies Compensations: Slow rate;Small sips/bites;Effortful swallow;Chin tuck Postural Changes and/or Swallow Maneuvers: Seated upright 90 degrees                  Oral care BID   Intermittent Supervision/Assistance Dysphagia, oropharyngeal phase (R13.12)     Continue with current plan of care     Pat Hever Castilleja,M.S.,CCC-SLP  08/14/2023, 1:37 PM

## 2023-08-14 NOTE — Progress Notes (Signed)
Pharmacy Antibiotic Note  Jeremy [name] is a 82 y.o. male for which pharmacy has been consulted for cefepime dosing for sepsis.  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 Patient presenting with SOB.  9/30 Urine culture with few pseudomonas aeruginosa, no growth on blood cultures WBC 10.4; afebrile Last HD session 10/1 - 2.4L removed  Plan: Continue cefepime 1g IV q24h  Monitor WBC, fever, renal function, cultures 7-day stop date entered  Height: 5\' 10"  (177.8 cm) Weight: 72.9 kg (160 lb 11.5 oz) IBW/kg (Calculated) : 73  Temp (24hrs), Avg:97.9 F (36.6 C), Min:97.5 F (36.4 C), Max:98.7 F (37.1 C)  Recent Labs  Lab 08/10/23 1559 08/10/23 1713 08/10/23 2326 08/11/23 0244 08/11/23 0841 08/11/23 1519 08/12/23 0750 08/13/23 0803 08/14/23 0425  WBC 6.6  --   --  6.4  --   --  10.6* 11.3* 10.4  CREATININE 5.65*  --   --  6.08*  --   --  5.13* 7.02* 4.39*  LATICACIDVEN  --  0.8 2.1*  --  1.3 1.2  --   --   --     Estimated Creatinine Clearance: 13.1 mL/min (A) (by C-G formula based on SCr of 4.39 mg/dL (H)).    Allergies  Allergen Reactions   Sulfa Antibiotics     Rash, light headed, shaking   Penicillins Other (See Comments)    hives severe as a child    Tape     Pulls skin off   Antibiotics this admission: Ceftriaxone 2g IV x1 9/29 Azithromycin 500mg  IV x1 9/29 Cefepime 9/29 >> (10/5) Metronidazole 9/29 >> 10/1 Vancomycin 9/29 >> 9/30   Microbiology: 9/29 Bld cx x2: ngtd 9/29 Ur Cx: >100k Enterobacter cloaecae (pan-S) 10/1 TA Cx: few PsA MRSA neg 9/30 resp panel>>neg  Thank you for allowing pharmacy to be a part of this patient's care.  Trixie Rude, PharmD Clinical Pharmacist 08/14/2023  12:47 PM

## 2023-08-14 NOTE — Care Management Important Message (Signed)
Important Message  Patient Details  Name: NEEKO PHARO MRN: [id number] Date of Birth: April 26, 1941   Important Message Given:  Yes - Medicare IM     Sherilyn Banker 08/14/2023, 2:52 PM

## 2023-08-14 NOTE — Evaluation (Signed)
Occupational Therapy Evaluation Patient Details Name: Jeremy [name] MRN: [id number] DOB: 09-07-1941 Today's Date: 08/14/2023   History of Present Illness 82 y.o. male presents to [facility] hospital on 08/10/2023 with somnolence. Pt found to be hypoxic. Concern for possible sepsis 2/2 UTI. Pt required intubation on 9/29, extubated on 10/1. PMH includes chronic respiratory failure, ESRD, tobacco abuse, OSA, HTN, HLD.   Clinical Impression   PTA, pt lives with spouse, typically Independent with ADLs, mobility and recently playing golf. Pt presents with deficits in cardiopulmonary endurance, strength, standing balance and safety awareness. Pt quick with movements requiring frequent cues for safety and pacing. Overall, pt able to walk briefly in hallway with Min A using RW, Supervision for UB ADL and Min A for LB ADLs. Pt did require 6 L O2 to maintain sats 90% with activity during session. Wife present, reports pt with likely more mobility at home but wants to ensure she can provide the needed assist. Wife debating inpatient rehab vs home w/ potential hired assistance. Will continue to follow acutely.       If plan is discharge home, recommend the following: A little help with walking and/or transfers;A little help with bathing/dressing/bathroom;Assistance with cooking/housework;Direct supervision/assist for medications management;Direct supervision/assist for financial management;Help with stairs or ramp for entrance    Functional Status Assessment  Patient has had a recent decline in their functional status and demonstrates the ability to make significant improvements in function in a reasonable and predictable amount of time.  Equipment Recommendations  Other (comment) (TBD pending progress)    Recommendations for Other Services Rehab consult     Precautions / Restrictions Precautions Precautions: Fall;Other (comment) Precaution Comments: monitor O2 - does not wear at baseline Restrictions Weight  Bearing Restrictions: No      Mobility Bed Mobility Overal bed mobility: Needs Assistance Bed Mobility: Supine to Sit, Sit to Supine     Supine to sit: Supervision, HOB elevated Sit to supine: Supervision        Transfers Overall transfer level: Needs assistance Equipment used: Rolling walker (2 wheels) Transfers: Sit to/from Stand Sit to Stand: Min assist, Contact guard assist           General transfer comment: cues for safe hand placement and safe pacing      Balance Overall balance assessment: Needs assistance Sitting-balance support: No upper extremity supported, Feet supported Sitting balance-Leahy Scale: Good     Standing balance support: Bilateral upper extremity supported, During functional activity Standing balance-Leahy Scale: Fair                             ADL either performed or assessed with clinical judgement   ADL Overall ADL's : Needs assistance/impaired Eating/Feeding: Independent   Grooming: Contact guard assist;Standing;Wash/dry hands   Upper Body Bathing: Supervision/ safety;Sitting   Lower Body Bathing: Minimal assistance;Sitting/lateral leans;Sit to/from stand   Upper Body Dressing : Supervision/safety   Lower Body Dressing: Minimal assistance;Sit to/from stand;Sitting/lateral leans   Toilet Transfer: Minimal assistance;Ambulation;Rolling walker (2 wheels) Toilet Transfer Details (indicate cue type and reason): cues for safety and slower pacing throughout Toileting- Clothing Manipulation and Hygiene: Minimal assistance;Sitting/lateral lean;Sit to/from stand Toileting - Clothing Manipulation Details (indicate cue type and reason): assist for balance in standing and cues for safety, able to perform hygiene with some assist for clothing mgmt     Functional mobility during ADLs: Minimal assistance;Rolling walker (2 wheels);Cueing for sequencing;Cueing for safety  Vision Baseline Vision/History: 1 Wears  glasses Ability to See in Adequate Light: 1 Impaired Patient Visual Report: No change from baseline Vision Assessment?: No apparent visual deficits     Perception         Praxis         Pertinent Vitals/Pain Pain Assessment Pain Assessment: No/denies pain     Extremity/Trunk Assessment Upper Extremity Assessment Upper Extremity Assessment: Generalized weakness;Right hand dominant   Lower Extremity Assessment Lower Extremity Assessment: Defer to PT evaluation   Cervical / Trunk Assessment Cervical / Trunk Assessment: Normal   Communication Communication Communication: Hearing impairment   Cognition Arousal: Alert Behavior During Therapy: WFL for tasks assessed/performed, Impulsive Overall Cognitive Status: Impaired/Different from baseline Area of Impairment: Memory, Safety/judgement, Awareness, Problem solving                     Memory: Decreased short-term memory   Safety/Judgement: Decreased awareness of safety, Decreased awareness of deficits Awareness: Emergent Problem Solving: Requires verbal cues General Comments: pleasant, very impulsive requiring frequent cues/redirection for safety, line mgmt and waiting until therapist ready for pt to move.     General Comments  Wife at bedside with questions regarding rehab setup vs home therapy set up    Exercises     Shoulder Instructions      Home Living Family/patient expects to be discharged to:: Private residence Living Arrangements: Spouse/significant other Available Help at Discharge: Family;Available 24 hours/day Type of Home: House Home Access: Stairs to enter [employer] of Steps: 4 Entrance Stairs-Rails: Can reach both Home Layout: Multi-level Alternate Level Stairs-Number of Steps: stair lift   Bathroom Shower/Tub: Chief Strategy Officer: Standard     Home Equipment: Cane - single point;Shower seat          Prior Functioning/Environment Prior Level of  Function : Independent/Modified Independent;Driving             Mobility Comments: recent decline in mobility although still golfing. Occasional use of stair lift when fatigued ADLs Comments: Indep with ADls, occasional use of shower chair though not often. shares IADLs with wife        OT Problem List: Decreased strength;Decreased activity tolerance;Impaired balance (sitting and/or standing);Cardiopulmonary status limiting activity;Decreased knowledge of use of DME or AE;Decreased safety awareness;Decreased cognition      OT Treatment/Interventions: Self-care/ADL training;Therapeutic exercise;Energy conservation;DME and/or AE instruction;Therapeutic activities;Patient/family education;Balance training    OT Goals(Current goals can be found in the care plan section) Acute Rehab OT Goals Patient Stated Goal: pt would like to walk, be able to go home quicker OT Goal Formulation: With patient/family Time For Goal Achievement: 08/28/23 Potential to Achieve Goals: Good ADL Goals Pt Will Perform Lower Body Bathing: with modified independence;sit to/from stand Pt Will Perform Lower Body Dressing: with modified independence;sit to/from stand Pt Will Transfer to Toilet: with modified independence;ambulating Additional ADL Goal #1: Pt to verbalize at least 2 fall prevention strategies to implement at home Additional ADL Goal #2: Pt to verbalize at least 3 energy conservation strategies to implement at home  OT Frequency: Min 1X/week    Co-evaluation              AM-PAC OT "6 Clicks" Daily Activity     Outcome Measure Help from another person eating meals?: None Help from another person taking care of personal grooming?: A Little Help from another person toileting, which includes using toliet, bedpan, or urinal?: A Little Help from another person bathing (including washing, rinsing,  drying)?: A Little Help from another person to put on and taking off regular upper body clothing?: A  Little Help from another person to put on and taking off regular lower body clothing?: A Little 6 Click Score: 19   End of Session Equipment Utilized During Treatment: Rolling walker (2 wheels);Gait belt;Oxygen Nurse Communication: Mobility status  Activity Tolerance: Patient tolerated treatment well Patient left: in bed;with call bell/phone within reach;with family/visitor present  OT Visit Diagnosis: Unsteadiness on feet (R26.81);Other abnormalities of gait and mobility (R26.89);Muscle weakness (generalized) (M62.81)                Time: 4540-9811 OT Time Calculation (min): 32 min Charges:  OT General Charges $OT Visit: 1 Visit OT Evaluation $OT Eval Moderate Complexity: 1 Mod OT Treatments $Self Care/Home Management : 8-22 mins  Bradd Canary, OTR/L Acute Rehab Services Office: [phone]   Lorre Munroe 08/14/2023, 11:17 AM

## 2023-08-14 NOTE — Progress Notes (Signed)
Dr Sunnie Nielsen called RN about ABG.  Initially stated to increase O2 to 5 L [place].  However, Dr Sunnie Nielsen called back and stated to leave O2NC at 4 L.

## 2023-08-14 NOTE — Progress Notes (Signed)
Physical Therapy Treatment Patient Details Name: Jeremy [name] MRN: [id number] DOB: 07/02/1941 Today's Date: 08/14/2023   History of Present Illness 82 y.o. male presents to [facility] hospital on 08/10/2023 with somnolence. Pt found to be hypoxic. Concern for possible sepsis 2/2 UTI. Pt required intubation on 9/29, extubated on 10/1. PMH includes chronic respiratory failure, ESRD, tobacco abuse, OSA, HTN, HLD.    PT Comments  Pt received in supine and agreeable to session. Pt demonstrates improved activity tolerance this session. Pt able to perform x5 serial STS with dense cues for safe hand placement, but improves carryover by final trial. Pt able to tolerate increased gait distance with min A for balance due to instability. Pt demonstrates some impulsivity with mobility throughout session requiring increased cues for safety and direction. Pt continues to benefit from PT services to progress toward functional mobility goals.    If plan is discharge home, recommend the following: A lot of help with walking and/or transfers;A lot of help with bathing/dressing/bathroom;Assistance with cooking/housework;Assist for transportation;Help with stairs or ramp for entrance   Can travel by private vehicle        Equipment Recommendations  Rolling walker (2 wheels);BSC/3in1    Recommendations for Other Services       Precautions / Restrictions Precautions Precautions: Fall;Other (comment) Precaution Comments: monitor O2 - does not wear at baseline Restrictions Weight Bearing Restrictions: No     Mobility  Bed Mobility Overal bed mobility: Needs Assistance Bed Mobility: Supine to Sit, Sit to Supine     Supine to sit: Supervision, HOB elevated Sit to supine: Min assist, HOB elevated   General bed mobility comments: Min A for BLE elevation to EOB    Transfers Overall transfer level: Needs assistance Equipment used: Rolling walker (2 wheels) Transfers: Sit to/from Stand Sit to Stand: Contact  guard assist           General transfer comment: From EOB with cues for safe hand placement and CGA for safety. Increased time for power up, but no physical assist needed    Ambulation/Gait Ambulation/Gait assistance: Min assist Gait Distance (Feet): 65 Feet Assistive device: Rolling walker (2 wheels) Gait Pattern/deviations: Step-through pattern, Decreased stride length, Trunk flexed       General Gait Details: Pt demonstrating instability with RW support requiring intermittent min A for balance. Pt requires cues for safety and obstacle negotiation      Balance Overall balance assessment: Needs assistance Sitting-balance support: No upper extremity supported, Feet supported Sitting balance-Leahy Scale: Fair Sitting balance - Comments: Sitting EOB. Pt demonstrating posterior lean with BLE exercises requiring cues, but no physical assist to correct   Standing balance support: Bilateral upper extremity supported, During functional activity Standing balance-Leahy Scale: Fair Standing balance comment: with RW support                            Cognition Arousal: Alert Behavior During Therapy: Impulsive, WFL for tasks assessed/performed Overall Cognitive Status: Impaired/Different from baseline                                 General Comments: Cues for safety and direction throughout        Exercises General Exercises - Lower Extremity Long Arc Quad: AROM, 10 reps, Seated, Both Hip Flexion/Marching: AROM, 10 reps, Seated, Both Other Exercises Other Exercises: x5 serial STS    General Comments General comments (skin integrity,  edema, etc.): Wife at bedside with questions regarding rehab setup vs home therapy set up      Pertinent Vitals/Pain Pain Assessment Pain Assessment: No/denies pain     PT Goals (current goals can now be found in the care plan section) Acute Rehab PT Goals Patient Stated Goal: to return to independence in mobility,  golfing PT Goal Formulation: With patient Time For Goal Achievement: 08/26/23 Progress towards PT goals: Progressing toward goals    Frequency    Min 1X/week       AM-PAC PT "6 Clicks" Mobility   Outcome Measure  Help needed turning from your back to your side while in a flat bed without using bedrails?: A Little Help needed moving from lying on your back to sitting on the side of a flat bed without using bedrails?: A Little Help needed moving to and from a bed to a chair (including a wheelchair)?: A Little Help needed standing up from a chair using your arms (e.g., wheelchair or bedside chair)?: A Little Help needed to walk in hospital room?: A Little Help needed climbing 3-5 steps with a railing? : Total 6 Click Score: 16    End of Session Equipment Utilized During Treatment: Oxygen;Gait belt Activity Tolerance: Patient tolerated treatment well Patient left: in bed;with call bell/phone within reach;with family/visitor present Nurse Communication: Mobility status PT Visit Diagnosis: Other abnormalities of gait and mobility (R26.89);Muscle weakness (generalized) (M62.81)     Time: 1133-1202 PT Time Calculation (min) (ACUTE ONLY): 29 min  Charges:    $Gait Training: 8-22 mins $Therapeutic Exercise: 8-22 mins PT General Charges $$ ACUTE PT VISIT: 1 Visit                     Johny Shock, PTA Acute Rehabilitation Services Secure Chat Preferred  [phone]    Johny Shock 08/14/2023, 12:29 PM

## 2023-08-14 NOTE — Progress Notes (Signed)
Patient's wife concerned about patient's mentation and requesting to speak to MD.  Dr Sunnie Nielsen notified (came to bedside to speak to wife).

## 2023-08-14 NOTE — Progress Notes (Signed)
Pharmacy Antibiotic Note  Jeremy [name] is a 82 y.o. male for which pharmacy initially consulted for cefepime dosing for sepsis. Now consulted for cipro to complete 7 days therapy.  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 Patient presenting with SOB.  WBC 10.4; afebrile Last HD session 10/1 - 2.4L removed, plan session today Trach: few Pseudomonas Urine: Enterobacter  Plan: DC cefepime Cipro 500mg  PO at bedtime x 3 days  Height: 5\' 10"  (177.8 cm) Weight: 72.9 kg (160 lb 11.5 oz) IBW/kg (Calculated) : 73  Temp (24hrs), Avg:97.9 F (36.6 C), Min:97.5 F (36.4 C), Max:98.7 F (37.1 C)  Recent Labs  Lab 08/10/23 1559 08/10/23 1713 08/10/23 2326 08/11/23 0244 08/11/23 0841 08/11/23 1519 08/12/23 0750 08/13/23 0803 08/14/23 0425  WBC 6.6  --   --  6.4  --   --  10.6* 11.3* 10.4  CREATININE 5.65*  --   --  6.08*  --   --  5.13* 7.02* 4.39*  LATICACIDVEN  --  0.8 2.1*  --  1.3 1.2  --   --   --     Estimated Creatinine Clearance: 13.1 mL/min (A) (by C-G formula based on SCr of 4.39 mg/dL (H)).    Allergies  Allergen Reactions   Sulfa Antibiotics     Rash, light headed, shaking   Penicillins Other (See Comments)    hives severe as a child    Tape     Pulls skin off   Antibiotics this admission: Ceftriaxone 2g IV x1 9/29 Azithromycin 500mg  IV x1 9/29 Cefepime 9/29 >> 10/3 Metronidazole 9/29 >> 10/1 Vancomycin 9/29 >> 9/30 Cipro 10/3 >> (10/5)   Microbiology: 9/29 Bld cx x2: ngtd 9/29 Ur Cx: >100k Enterobacter cloaecae (pan-S) 10/1 TA Cx: few PsA MRSA neg 9/30 resp panel>>neg  Thank you for allowing pharmacy to be a part of this patient's care.  Loralee Pacas, PharmD, BCPS 08/14/2023  5:03 PM  Please check AMION for all [facility] Pharmacy phone numbers After 10:00 PM, call Main Pharmacy [phone]

## 2023-08-15 ENCOUNTER — Inpatient Hospital Stay (HOSPITAL_COMMUNITY): Payer: Medicare Other

## 2023-08-15 DIAGNOSIS — J9622 Acute and chronic respiratory failure with hypercapnia: Secondary | ICD-10-CM | POA: Diagnosis not present

## 2023-08-15 LAB — CBC
HCT: 35.4 % — ABNORMAL LOW (ref 39.0–52.0)
Hemoglobin: 11.2 g/dL — ABNORMAL LOW (ref 13.0–17.0)
MCH: 30.7 pg (ref 26.0–34.0)
MCHC: 31.6 g/dL (ref 30.0–36.0)
MCV: 97 fL (ref 80.0–100.0)
Platelets: 291 10*3/uL (ref 150–400)
RBC: 3.65 MIL/uL — ABNORMAL LOW (ref 4.22–5.81)
RDW: 14.3 % (ref 11.5–15.5)
WBC: 8.9 10*3/uL (ref 4.0–10.5)
nRBC: 0 % (ref 0.0–0.2)

## 2023-08-15 LAB — BASIC METABOLIC PANEL
Anion gap: 14 (ref 5–15)
BUN: 63 mg/dL — ABNORMAL HIGH (ref 8–23)
CO2: 27 mmol/L (ref 22–32)
Calcium: 8.4 mg/dL — ABNORMAL LOW (ref 8.9–10.3)
Chloride: 93 mmol/L — ABNORMAL LOW (ref 98–111)
Creatinine, Ser: 4.66 mg/dL — ABNORMAL HIGH (ref 0.61–1.24)
GFR, Estimated: 12 mL/min — ABNORMAL LOW (ref >60)
Glucose, Bld: 132 mg/dL — ABNORMAL HIGH (ref 70–99)
Potassium: 4.7 mmol/L (ref 3.5–5.1)
Sodium: 134 mmol/L — ABNORMAL LOW (ref 135–145)

## 2023-08-15 LAB — BRAIN NATRIURETIC PEPTIDE: B Natriuretic Peptide: 2935.7 pg/mL — ABNORMAL HIGH (ref 0.0–100.0)

## 2023-08-15 LAB — BLOOD GAS, ARTERIAL
Acid-Base Excess: 3.1 mmol/L — ABNORMAL HIGH (ref 0.0–2.0)
Bicarbonate: 30.7 mmol/L — ABNORMAL HIGH (ref 20.0–28.0)
Drawn by: 54887
O2 Saturation: 94.7 %
Patient temperature: 37
pCO2 arterial: 61 mm[Hg] — ABNORMAL HIGH (ref 32–48)
pH, Arterial: 7.31 — ABNORMAL LOW (ref 7.35–7.45)
pO2, Arterial: 66 mm[Hg] — ABNORMAL LOW (ref 83–108)

## 2023-08-15 LAB — GLUCOSE, CAPILLARY
Glucose-Capillary: 137 mg/dL — ABNORMAL HIGH (ref 70–99)
Glucose-Capillary: 93 mg/dL (ref 70–99)
Glucose-Capillary: 112 mg/dL — ABNORMAL HIGH (ref 70–99)
Glucose-Capillary: 153 mg/dL — ABNORMAL HIGH (ref 70–99)

## 2023-08-15 LAB — CULTURE, BLOOD (ROUTINE X 2)
Culture: NO GROWTH
Culture: NO GROWTH
Special Requests: ADEQUATE
Special Requests: ADEQUATE

## 2023-08-15 MED ORDER — NICOTINE 14 MG/24HR TD PT24
14.0000 mg | MEDICATED_PATCH | Freq: Every day | TRANSDERMAL | Status: DC
  Administered 2023-08-15 – 2023-08-18 (×4): 14 mg via TRANSDERMAL
  Filled 2023-08-15 (×4): qty 1

## 2023-08-15 MED ORDER — ALBUTEROL SULFATE (2.5 MG/3ML) 0.083% IN NEBU
2.5000 mg | INHALATION_SOLUTION | Freq: Four times a day (QID) | RESPIRATORY_TRACT | Status: DC
  Administered 2023-08-15 – 2023-08-17 (×7): 2.5 mg via RESPIRATORY_TRACT
  Filled 2023-08-15 (×6): qty 3

## 2023-08-15 MED ORDER — CHLORHEXIDINE GLUCONATE CLOTH 2 % EX PADS: 6.0000 | MEDICATED_PAD | Freq: Every day | CUTANEOUS | Status: DC

## 2023-08-15 MED ORDER — HEPARIN SODIUM (PORCINE) 1000 UNIT/ML IJ SOLN
2500.0000 [IU] | Freq: Once | INTRAMUSCULAR | Status: AC
  Administered 2023-08-15: 2500 [IU]

## 2023-08-15 NOTE — Progress Notes (Signed)
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  He completed hemodialysis the day prior to admission.  He was noted to be coughing more.  He continues to smoke 1 pack/day.  Patient failed a trial on BiPAP and he was intubated on 9/29.  He was admitted with acute metabolic encephalopathy secondary to acidosis, shock, sepsis secondary to urinary tract infection versus pneumonia.  He required IV pressors and admitted by CCM.  He was  extubated 10/01. Off pressors.  Care transferred to triad 10/02.     Assessment & Plan:   Active Problems:   Hypoxia   Acute on chronic respiratory failure with hypercapnia (HCC)   Malnutrition of moderate degree  1-Acute metabolic encephalopathy, in the setting of acidosis, infection, hypoxia, Hypercapnia.  -CT Head: No acute intracranial abnormality -Patient became more confuse 10/03., suspect delirium. Change  cefepime to Cipro. -Lethargic this am. Didn't used BIPAP last night as ordered. Will repeat Blood gas.   Severe Sepsis,  shock required IV pressors -In the setting of pneumonia and UTI -He has been off of pressors -Chest x-ray on 9/29: Showed right lower lobe atelectasis or pneumonia. -Predatory culture growing Pseudomonas -Urine culture grew: Enterbacter Cloacae.  -Plan to treat for 7 days antibiotics. Received 4 days of Cefepime. Will complete 3 days of Cipro.   Acute on chronic hypoxic respiratory failure with hypercapnia OSA not on CPAP -Patient require intubation 9/29, extubated 10/01. Continue with bronchodilators Transition to oral prednisone.  Respiratory failure could be in the setting of acute pulmonary edema concern he may under  dialyzed due to change in hemodialysis regimen Concern for pneumonia, sputum culture growing Pseudomonas Respiratory toilet, flutter valve, incentive spirometry More hypoxic yesterday, Oxygen supplementation increased to 5 L.  Repeated chest x ray 10/4; Diffuse bilateral interstitial pulmonary opacity, consistent with edema or atypical/viral infection. Small layering left pleural effusion. Check BNP. Deep suction sounds congested. Will placed on BIPAP> repeat ABG, lethargic this am.   ESRD on hemodialysis Hyponatremia: Correction with HD.  Hyperkalemia: Correction with HD.  Anion gap metabolic acidosis Had HD last night.    Mild Elevation of troponins In the setting of sepsis.  No chest pain.  Hypertension; on metoprolol  Anemia of chronic kidney disease Monitor Hb.   Deconditioning Urine Retention; Continue self catheterization.   Dysphagia;  Evaluated by speech. On dysphagia 3 diet     Nutrition Problem: Moderate Malnutrition Etiology: chronic illness (ESRD/HD, COPD)    Signs/Symptoms: moderate fat depletion, severe muscle depletion    Interventions: Tube feeding, Refer to RD note for recommendations  Estimated body mass index is 23.22 kg/m as calculated from the following:   Height as of this encounter: 5\' 10"  (1.778 m).   Weight as of this encounter: 73.4 kg.   DVT prophylaxis: Heparin  Code Status: Full code Family Communication: Wife on 10/03. Will update l;ater.  Disposition Plan:  Status is: Inpatient Remains inpatient appropriate because: management of resp failure. No medical Stable to transfer to Rehab today.     Consultants:  CCM Nephrology   Procedures:  Mechanical intubation.   Antimicrobials:    Subjective: He is lethargic, wake up to voice. Denies worsening dyspnea. Fall back to sleep.  Objective: Vitals:   08/15/23 0137 08/15/23 0254 08/15/23 0801 08/15/23 0813  BP: (!) 150/80 134/82  (!) 145/54  Pulse: 76 79 83 81  Resp:  16  18 19   Temp:  (!) 97.1 F (36.2 C)  98.1 F (36.7 C)  TempSrc:  Oral  Oral  SpO2:  94% 100% 98%  Weight:  73.4 kg    Height:        Intake/Output Summary (Last 24 hours) at 08/15/2023 0915 Last data filed at 08/15/2023 0009 Gross per 24 hour  Intake 120 ml  Output 1500 ml  Net -1380 ml   Filed Weights   08/13/23 1836 08/14/23 0438 08/15/23 0254  Weight: 71.9 kg 72.9 kg 73.4 kg    Examination:  General exam: Lethargic, congested.  Respiratory system: BL [place].  Cardiovascular system: S 1, S 2 RRR Gastrointestinal system: BS present, soft, nt Central nervous system: sleepy Extremities: no edema    Data Reviewed: I have personally reviewed following labs and imaging studies  CBC: Recent Labs  Lab 08/10/23 1559 08/10/23 1611 08/10/23 1922 08/11/23 0244 08/12/23 0750 08/13/23 0803 08/14/23 0425  WBC 6.6  --   --  6.4 10.6* 11.3* 10.4  HGB 10.8*   < > 10.9* 10.2* 10.0* 10.3* 11.0*  HCT 34.7*   < > 32.0* 30.8* 30.8* 32.3* 33.7*  MCV 102.7*  --   --  97.5 96.9 96.4 98.0  PLT 268  --   --  251 256 267 263   < > = values in this interval not displayed.   Basic Metabolic Panel: Recent Labs  Lab 08/10/23 1559 08/10/23 1611 08/10/23 1922 08/11/23 0244 08/11/23 1739 08/12/23 0750 08/12/23 1651 08/13/23 0803 08/14/23 0425  NA 137   < > 134* 134*  --  133*  --  132* 133*  K 5.3*   < > 5.5* 4.7  --  5.1  --  5.9* 5.4*  CL 90*  --   --  96*  --  91*  --  93* 94*  CO2 30  --   --  22  --  23  --  22 24  GLUCOSE 102*  --   --  126*  --  133*  --  134* 117*  BUN 32*  --   --  44*  --  50*  --  91* 55*  CREATININE 5.65*  --   --  6.08*  --  5.13*  --  7.02* 4.39*  CALCIUM 8.6*  --   --  8.5*  --  8.7*  --  8.5* 8.3*  MG  --   --   --  2.5* 1.9 2.2 2.3  --   --   PHOS  --   --   --  5.2* 5.2* 6.5* 7.4*  --  4.6   < > = values in this interval not displayed.   GFR: Estimated Creatinine Clearance: 13.2 mL/min (A) (by C-G formula based on SCr of 4.39 mg/dL  (H)). Liver Function Tests: Recent Labs  Lab 08/10/23 1559 08/14/23 0425  AST 13*  --   ALT 10  --   ALKPHOS 65  --   BILITOT 0.6  --   PROT 7.2  --   ALBUMIN 3.3* 3.0*   No results for input(s): "LIPASE", "AMYLASE" in the last 168 hours. No results for input(s): "AMMONIA" in the last 168 hours. Coagulation Profile: No results for input(s): "INR", "PROTIME" in the last 168 hours. Cardiac Enzymes: No results  for input(s): "CKTOTAL", "CKMB", "CKMBINDEX", "TROPONINI" in the last 168 hours. BNP (last 3 results) No results for input(s): "PROBNP" in the last 8760 hours. HbA1C: No results for input(s): "HGBA1C" in the last 72 hours.  CBG: Recent Labs  Lab 08/13/23 2129 08/14/23 0748 08/14/23 1204 08/14/23 1615 08/15/23 0809  GLUCAP 145* 128* 173* 96 137*   Lipid Profile: No results for input(s): "CHOL", "HDL", "LDLCALC", "TRIG", "CHOLHDL", "LDLDIRECT" in the last 72 hours. Thyroid Function Tests: No results for input(s): "TSH", "T4TOTAL", "FREET4", "T3FREE", "THYROIDAB" in the last 72 hours. Anemia Panel: No results for input(s): "VITAMINB12", "FOLATE", "FERRITIN", "TIBC", "IRON", "RETICCTPCT" in the last 72 hours. Sepsis Labs: Recent Labs  Lab 08/10/23 1713 08/10/23 2326 08/11/23 0841 08/11/23 1519  LATICACIDVEN 0.8 2.1* 1.3 1.2    Recent Results (from the past 240 hour(s))  SARS Coronavirus 2 by RT PCR (hospital order, performed in [facility] hospital lab) *cepheid single result test* Anterior Nasal Swab     Status: None   Collection Time: 08/10/23  4:08 PM   Specimen: Anterior Nasal Swab  Result Value Ref Range Status   SARS Coronavirus 2 by RT PCR NEGATIVE NEGATIVE Final    Comment: Performed at [facility], [address] [place], [place] [address]  Blood culture (routine x 2)     Status: None   Collection Time: 08/10/23  5:18 PM   Specimen: BLOOD RIGHT ARM  Result Value Ref Range Status   Specimen Description BLOOD RIGHT ARM  Final   Special  Requests   Final    BOTTLES DRAWN AEROBIC AND ANAEROBIC Blood Culture adequate volume   Culture   Final    NO GROWTH 5 DAYS Performed at [facility], [address], [place], [place] [address]    Report Status 08/15/2023 FINAL  Final  Blood culture (routine x 2)     Status: None   Collection Time: 08/10/23  5:47 PM   Specimen: BLOOD RIGHT ARM  Result Value Ref Range Status   Specimen Description BLOOD RIGHT ARM  Final   Special Requests   Final    BOTTLES DRAWN AEROBIC AND ANAEROBIC Blood Culture adequate volume   Culture   Final    NO GROWTH 5 DAYS Performed at [facility], [address] [place], [place] [address]    Report Status 08/15/2023 FINAL  Final  Urine Culture (for pregnant, neutropenic or urologic patients or patients with an indwelling urinary catheter)     Status: Abnormal   Collection Time: 08/10/23  6:16 PM   Specimen: In/Out Cath Urine  Result Value Ref Range Status   Specimen Description IN/OUT CATH URINE  Final   Special Requests   Final    NONE Performed at [facility], [address], [place], [place] [address]    Culture >=100,000 COLONIES/mL ENTEROBACTER CLOACAE (A)  Final   Report Status 08/13/2023 FINAL  Final   Organism ID, Bacteria ENTEROBACTER CLOACAE (A)  Final      Susceptibility   Enterobacter cloacae - MIC*    CEFEPIME <=0.12 SENSITIVE Sensitive     CIPROFLOXACIN <=0.25 SENSITIVE Sensitive     GENTAMICIN <=1 SENSITIVE Sensitive     IMIPENEM 1 SENSITIVE Sensitive     NITROFURANTOIN 32 SENSITIVE Sensitive     TRIMETH/SULFA <=20 SENSITIVE Sensitive     PIP/TAZO <=4 SENSITIVE Sensitive     * >=100,000 COLONIES/mL ENTEROBACTER CLOACAE  Culture, Respiratory w Gram Stain     Status:  None   Collection Time: 08/11/23  8:07 AM   Specimen: Tracheal Aspirate; Respiratory  Result Value Ref Range Status   Specimen Description TRACHEAL ASPIRATE  Final   Special Requests NONE  Final   Gram Stain   Final    FEW WBC PRESENT,  PREDOMINANTLY PMN ABUNDANT GRAM POSITIVE COCCI IN CLUSTERS FEW GRAM POSITIVE COCCI IN CHAINS FEW GRAM POSITIVE RODS Performed at [facility], [address], [place], [place] [address]    Culture FEW PSEUDOMONAS AERUGINOSA  Final   Report Status 08/13/2023 FINAL  Final   Organism ID, Bacteria PSEUDOMONAS AERUGINOSA  Final      Susceptibility   Pseudomonas aeruginosa - MIC*    CEFTAZIDIME 4 SENSITIVE Sensitive     CIPROFLOXACIN <=0.25 SENSITIVE Sensitive     GENTAMICIN 2 SENSITIVE Sensitive     IMIPENEM 1 SENSITIVE Sensitive     PIP/TAZO 8 SENSITIVE Sensitive     CEFEPIME 2 SENSITIVE Sensitive     * FEW PSEUDOMONAS AERUGINOSA  Respiratory (~20 pathogens) panel by PCR     Status: None   Collection Time: 08/11/23  6:50 PM  Result Value Ref Range Status   Adenovirus NOT DETECTED NOT DETECTED Final   Coronavirus 229E NOT DETECTED NOT DETECTED Final    Comment: (NOTE) The Coronavirus on the Respiratory Panel, DOES NOT test for the novel  Coronavirus (2019 nCoV)    Coronavirus HKU1 NOT DETECTED NOT DETECTED Final   Coronavirus NL63 NOT DETECTED NOT DETECTED Final   Coronavirus OC43 NOT DETECTED NOT DETECTED Final   Metapneumovirus NOT DETECTED NOT DETECTED Final   Rhinovirus / Enterovirus NOT DETECTED NOT DETECTED Final   Influenza A NOT DETECTED NOT DETECTED Final   Influenza B NOT DETECTED NOT DETECTED Final   Parainfluenza Virus 1 NOT DETECTED NOT DETECTED Final   Parainfluenza Virus 2 NOT DETECTED NOT DETECTED Final   Parainfluenza Virus 3 NOT DETECTED NOT DETECTED Final   Parainfluenza Virus 4 NOT DETECTED NOT DETECTED Final   Respiratory Syncytial Virus NOT DETECTED NOT DETECTED Final   Bordetella pertussis NOT DETECTED NOT DETECTED Final   Bordetella Parapertussis NOT DETECTED NOT DETECTED Final   Chlamydophila pneumoniae NOT DETECTED NOT DETECTED Final   Mycoplasma pneumoniae NOT DETECTED NOT DETECTED Final    Comment: Performed at [facility] Lab, [address] [place], [place] [address]  MRSA Next Gen by PCR, Nasal     Status: None   Collection Time: 08/12/23 10:12 AM   Specimen: Nasal Mucosa; Nasal Swab  Result Value Ref Range Status   MRSA by PCR Next Gen NOT DETECTED NOT DETECTED Final    Comment: (NOTE) The GeneXpert MRSA Assay (FDA approved for NASAL specimens only), is one component of a comprehensive MRSA colonization surveillance program. It is not intended to diagnose MRSA infection nor to guide or monitor treatment for MRSA infections. Test performance is not FDA approved in patients less than 45 years old. Performed at [facility], [address], [place], [place] [address]          Radiology Studies: DG CHEST PORT 1 VIEW  Result Date: 08/15/2023 CLINICAL DATA:  Hypoxia, shortness of breath EXAM: PORTABLE CHEST 1 VIEW COMPARISON:  08/10/2023 FINDINGS: The heart size and mediastinal contours are within normal limits. Diffuse bilateral interstitial pulmonary opacity. Small layering left pleural effusion. The visualized skeletal structures are unremarkable. IMPRESSION: Diffuse bilateral interstitial pulmonary opacity, consistent with edema or atypical/viral infection. Small layering left pleural effusion. Electronically  Signed   By: Jearld Lesch M.D.   On: 08/15/2023 08:24        Scheduled Meds:  arformoterol  15 mcg Nebulization BID   budesonide (PULMICORT) nebulizer solution  0.5 mg Nebulization BID   ciprofloxacin  500 mg Oral QHS   docusate  100 mg Oral BID   guaiFENesin  600 mg Oral BID   heparin  5,000 Units Subcutaneous Q8H   insulin aspart  0-9 Units Subcutaneous TID WC   metoprolol tartrate  25 mg Oral BID   multivitamin  1 tablet Oral QHS   mouth rinse  15 mL Mouth Rinse 4 times per day   polyethylene glycol  17 g Oral Daily   predniSONE  40 mg Oral Q breakfast   revefenacin  175 mcg Nebulization Daily   sevelamer carbonate  1.6 g Oral TID WC   Continuous Infusions:  sodium chloride Stopped  (08/10/23 1937)     LOS: 5 days    Time spent: 35 minutes.     Alba Cory, MD [facility]   If 7PM-7AM, please contact night-coverage [email or web address]  08/15/2023, 9:15 AM

## 2023-08-15 NOTE — Progress Notes (Signed)
Received patient in bed to unit.  Alert and oriented.  Informed consent signed and in chart.   TX duration: 2:00  Patient tolerated well.  Transported back to the room  Alert, without acute distress.  Hand-off given to patient's nurse.   Access used: LUE AVF Access issues: Cannulation x 2 Venous access d/t needle displacement caused by patient movement; no infiltration noted, no c/o voiced.  Total UF removed: 1500 mL Medication(s) given: Heparin bolus 2000 units Post HD VS: please see data insert    08/15/23 0009  Vitals  Temp 98.4 F (36.9 C)  Temp Source Oral  BP (!) 166/66  MAP (mmHg) 92  BP Location Right Arm  BP Method Automatic  Patient Position (if appropriate) Lying  Pulse Rate 80  Pulse Rate Source Monitor  ECG Heart Rate 79  Resp 16  Oxygen Therapy  SpO2 93 %  O2 Device Nasal Cannula  O2 Flow Rate (L/min) 4 L/min  Patient Activity (if Appropriate) In bed  Pulse Oximetry Type Continuous  Post Treatment  Dialyzer Clearance Lightly streaked  Hemodialysis Intake (mL) 0 mL  Liters Processed 45.4  Fluid Removed (mL) 1500 mL  Tolerated HD Treatment Yes  Post-Hemodialysis Comments Treatment completed and blood returned without issue.  AVG/AVF Arterial Site Held (minutes) 7 minutes (Gauze applied and secured with paper tape; hemostasis achieved.)  AVG/AVF Venous Site Held (minutes) 7 minutes (Gauze applied and secured with paper tape; hemostasis achieved.)  Note  Patient Observations Patient alert, no c/o voiced, no acute distress noted; patient condition stable for return transport.  Fistula / Graft Left Upper arm Arteriovenous fistula  Placement Date/Time: 10/16/22 1059   Placed prior to admission: No  Orientation: Left  Access Location: Upper arm  Access Type: Arteriovenous fistula  Site Condition No complications  Fistula / Graft Assessment Present;Thrill;Bruit  Status Flushed;Patent;Deaccessed  Drainage Description None      Jeremy [name] [facility]  Dialysis Unit

## 2023-08-15 NOTE — Progress Notes (Signed)
[facility] Progress Note  Subjective: 1.5 L off w/ extra HD last night. Worsening resp status today and now on bipap. CXR shows mild IS edema still. BNP 2900. Abg pCO2 50-65, more alert now on bipap, was very lethargic earlier.   Vitals:   08/15/23 0813 08/15/23 1043 08/15/23 1154 08/15/23 1340  BP: (!) 145/54  (!) 173/67   Pulse: 81 75 78   Resp: 19 16 18 16   Temp: 98.1 F (36.7 C)  98.9 F (37.2 C)   TempSrc: Oral  Axillary   SpO2: 98%  100%   Weight:      Height:        Exam: Extubated and alert, [place] O2 No jvd Chest - CTA bilat Cor reg no mrg Abd soft, ntnd  Ext no edema UE/ LE"s   LUA AVF+ bruit     OP HD: [facility] TTS  3h   73.3kg   400/600  2/2 bath  Heparin 2500  LUA AVF Hectorol 2  Last Mircera 7/2    9/29 CXR - R lower lung atx vs pna, no edema   Assessment/Plan: ESRD - on HD TTS. Left at his EDW last Saturday despite a shortened treatment.  Pt got HD here in the hospital on Monday and Wed w/ 4.5 L UF total.  Had short HD last night 1.5 L off. Today resp status is worse and wt's are up, this is likely due to the liquid diet he is on. Would make him NPO and will plan HD later this evening and again tomorrow. Need to get volume down.  Sepsis / UTI/ PNA- UCx grew enterobacter and sputum cx grew pseudomonas. Getting IV cefepime.  Acute on chronic resp failure - prob pna and volume components. Off the vent tuesday. Hx of COPD.  Volume - lowest wt here was 69.5kg, up to 73kg now. Daily HD if staffing allows to get vol down 3- 5kg .  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 HTN - resume home regimen. 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 Anemia of chronic disease - last received Mircera 60 on 7/2 and Hb was on 10.7 on 9/26. Chronic resp failure - due to OSA + COPD     Jeremy Moselle MD  [facility] 08/15/2023, 3:47 PM  Recent Labs  Lab  08/10/23 1559 08/10/23 1611 08/12/23 1651 08/13/23 0803 08/14/23 0425 08/15/23 0948  HGB 10.8*   < >  --    < > 11.0* 11.2*  ALBUMIN 3.3*  --   --   --  3.0*  --   CALCIUM 8.6*   < >  --    < > 8.3* 8.4*  PHOS  --    < > 7.4*  --  4.6  --   CREATININE 5.65*   < >  --    < > 4.39* 4.66*  K 5.3*   < >  --    < > 5.4* 4.7   < > = values in this interval not displayed.   No results for input(s): "IRON", "TIBC", "FERRITIN" in the last 168 hours. Inpatient medications:  albuterol  2.5 mg Nebulization Q6H   arformoterol  15 mcg Nebulization BID   budesonide (PULMICORT) nebulizer solution  0.5 mg Nebulization BID   ciprofloxacin  500 mg Oral QHS   docusate  100 mg Oral BID   guaiFENesin  600 mg Oral  BID   heparin  5,000 Units Subcutaneous Q8H   insulin aspart  0-9 Units Subcutaneous TID WC   metoprolol tartrate  25 mg Oral BID   multivitamin  1 tablet Oral QHS   nicotine  14 mg Transdermal Daily   mouth rinse  15 mL Mouth Rinse 4 times per day   polyethylene glycol  17 g Oral Daily   predniSONE  40 mg Oral Q breakfast   revefenacin  175 mcg Nebulization Daily   sevelamer carbonate  1.6 g Oral TID WC    sodium chloride Stopped (08/10/23 1937)   hydrALAZINE, labetalol, mouth rinse

## 2023-08-15 NOTE — TOC Progression Note (Signed)
Transition of Care [facility]) - Progression Note    Patient Details  Name: Jeremy [name] MRN: [id number] Date of Birth: 1941-01-18  Transition of [facility]) CM/SW Contact  Graves-Bigelow, Lamar Laundry, RN Phone Number: 08/15/2023, 1:02 PM  Clinical Narrative:  Case Manager faxed clinicals upon spouses request to [facility] IPR facility on yesterday. Case Manager spoke with Velna Hatchet at [facility] I[facility]- she is reviewing the PT notes and the overall oxygen demand needed for the patient during therapy. Clinicals sent yesterday reflected 5 liters oxygen during therapy and the [facility] felt that to be too high for 3 hours of therapy. [name] with PT will see the patient today and document 02 needs via the session. [facility] will continue to review on Monday to see if the patient is a candidate or not. Patient is currently on BIPAP during the visit with spouse. Spouse had some questions regarding long term care policy for SNF. We discussed the policy and SNF options; the spouse wants the CSW to fax out to [facility], [employer], and [employer]. Spouse asked that the chaplain assist with HCPOA paperwork- Staff RN aware to assist once son arrives. Case Manager will continue to follow for additional transition of care needs as the patient progresses.      Expected Discharge Plan: IP Rehab Facility Barriers to Discharge: Continued Medical Work up  Expected Discharge Plan and Services   Discharge Planning Services: CM Consult   Living arrangements for the past 2 months: Single Family Home  Social Determinants of Health (SDOH) Interventions SDOH Screenings   Food Insecurity: No Food Insecurity (08/14/2023)  Housing: Low Risk  (08/14/2023)  Transportation Needs: No Transportation Needs (08/14/2023)  Utilities: Not At Risk (08/14/2023)  Depression (PHQ2-9): Low Risk  (05/19/2023)  Tobacco Use: High Risk (08/14/2023)   Readmission Risk Interventions     No data to display

## 2023-08-15 NOTE — Plan of Care (Signed)
  Problem: Activity: Goal: Ability to tolerate increased activity will improve Outcome: Progressing   Problem: Respiratory: Goal: Ability to maintain a clear airway and adequate ventilation will improve Outcome: Progressing   Problem: Role Relationship: Goal: Method of communication will improve Outcome: Progressing   Problem: Education: Goal: Knowledge of General Education information will improve Description: Including pain rating scale, medication(s)/side effects and non-pharmacologic comfort measures Outcome: Progressing   Problem: Health Behavior/Discharge Planning: Goal: Ability to manage health-related needs will improve Outcome: Progressing

## 2023-08-15 NOTE — Progress Notes (Signed)
Pt receives out-pt HD at [facility] on TTS 12:00 chair time. Will assist as needed.   Olivia Canter Renal Navigator [phone]

## 2023-08-15 NOTE — Progress Notes (Signed)
TRH night cross cover note:   I was notified by RN that the patient has just returned from HD session and that he missed his 2200 dose of metoprolol tartrate while he was in the HD unit.  Most recent blood pressure 151/80, with heart rates in the 70s.  He is due for his next dose of metoprolol tartrate at 10 AM this morning.  Given his current vital signs and the temporal proximity to his next scheduled dose of metoprolol tartrate, will refrain from additional interval dose of beta-blocker at this time.    Newton Pigg, DO Hospitalist

## 2023-08-15 NOTE — TOC Progression Note (Addendum)
Transition of Care [facility]) - Progression Note    Patient Details  Name: Jeremy [name] MRN: [id number] Date of Birth: 09/13/1941  [facility]) CM/SW Contact  Delilah Shan, LCSWA Phone Number: 08/15/2023, 2:52 PM  Clinical Narrative:     CSW spoke with patients spouse at bedside and provided medicare compare ratings list of accepted SNF bed offers for patient as back up plan to inpatient rehab. CSW informed patients spouse that [facility] and [facility] unable to offer. CSW LVM for [facility]. CSW awaiting call back to see if they can offer SNF bed. Patient spouse is going to review SNF offers and follow back up with weekend social worker on SNF choice for patient. All questions answered. No further questions reported at this time  Update- CSW received call from patients spouse who informed CSW that she is agreeable to [facility] SNF as back up choice for patient. CSW will continue to follow.  Expected Discharge Plan: IP Rehab Facility Barriers to Discharge: Continued Medical Work up  Expected Discharge Plan and Services   Discharge Planning Services: CM Consult   Living arrangements for the past 2 months: Single Family Home                                       Social Determinants of Health (SDOH) Interventions SDOH Screenings   Food Insecurity: No Food Insecurity (08/14/2023)  Housing: Low Risk  (08/14/2023)  Transportation Needs: No Transportation Needs (08/14/2023)  Utilities: Not At Risk (08/14/2023)  Depression (PHQ2-9): Low Risk  (05/19/2023)  Tobacco Use: High Risk (08/14/2023)    Readmission Risk Interventions     No data to display

## 2023-08-15 NOTE — Progress Notes (Signed)
ABG sample collected and sent to Lab. Lab called and notified.

## 2023-08-15 NOTE — Progress Notes (Signed)
PT Cancellation Note  Patient Details Name: Jeremy [name] MRN: [id number] DOB: [birth year]   Cancelled Treatment:    Reason Eval/Treat Not Completed: Medical issues which prohibited therapy. Pt on bipap.    Angelina Ok [facility] 08/15/2023, 2:32 PM Skip Mayer PT Acute [employer] [phone]

## 2023-08-16 DIAGNOSIS — J9622 Acute and chronic respiratory failure with hypercapnia: Secondary | ICD-10-CM | POA: Diagnosis not present

## 2023-08-16 LAB — GLUCOSE, CAPILLARY
Glucose-Capillary: 100 mg/dL — ABNORMAL HIGH (ref 70–99)
Glucose-Capillary: 211 mg/dL — ABNORMAL HIGH (ref 70–99)
Glucose-Capillary: 151 mg/dL — ABNORMAL HIGH (ref 70–99)

## 2023-08-16 MED ORDER — CHLORHEXIDINE GLUCONATE CLOTH 2 % EX PADS
6.0000 | MEDICATED_PAD | Freq: Every day | CUTANEOUS | Status: DC
  Administered 2023-08-17: 6 via TOPICAL

## 2023-08-16 MED ORDER — HEPARIN SODIUM (PORCINE) 1000 UNIT/ML IJ SOLN
INTRAMUSCULAR | Status: AC
  Filled 2023-08-16: qty 1

## 2023-08-16 MED ORDER — DOXERCALCIFEROL 4 MCG/2ML IV SOLN
2.0000 ug | INTRAVENOUS | Status: DC
  Administered 2023-08-19: 2 ug via INTRAVENOUS
  Filled 2023-08-16 (×3): qty 2

## 2023-08-16 NOTE — Progress Notes (Signed)
[facility] Progress Note  Subjective: had HD overnight w/ 2 .7 L UF, BP's dropped slightly and not too low. This am is weaned down from bipap to [employer] to RA. Pt seen in room. Asking for something to eat.   Vitals:   08/16/23 0338 08/16/23 0743 08/16/23 0803 08/16/23 1035  BP: 134/72 (!) 131/54    Pulse: 86 (!) 44  82  Resp: 20 20    Temp: 97.8 F (36.6 C) 97.9 F (36.6 C)    TempSrc: Oral Oral    SpO2: 100% 93% 96%   Weight:      Height:        Exam: Alert, on room air Dry mouth No jvd Chest - CTA bilat Cor reg no mrg Abd soft, ntnd  Ext no edema UE/ LE"s   LUA AVF+ bruit     Renal related home meds: - metoprolol xl 50 every day - renvela 2400mg  ac tid    OP HD: SW TTS  3h   73.3kg   400/600  2/2 bath  Heparin 2500  LUA AVF Hectorol 2  Last Mircera 7/2    9/29 CXR - R lower lung atx vs pna, no edema   Assessment/Plan: Acute on chronic resp failure - prob pna and volume components. Off the vent tuesday. Hx of COPD. Pt has poor respiratory reserve and doesn't tolerate volume excess very well here. Getting steroid taper here and po abx w/ cipro.   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 Left at his EDW last Saturday at op unit. Has had HD here Monday, Wed and short session Thursday. Also had HD last night Friday night. Plan HD again today to get vol down further and get back on schedule.  Sepsis / UTI/ PNA- UCx grew enterobacter and sputum cx grew pseudomonas. SP IV cefepime --> now switched to po cipro.   Volume - lowest wt here was 69kg. Feel like this is not low enough, when he starts back w/ liquids his wt's will go up and he will be back on bipap. He has very poor respiratory reserve, likely due to the COPD.  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 Getting binders. Resuming IV vdra.  HTN - getting metoprolol bid  Urinary retention - continue with self cath tid Anemia of chronic disease - last received Mircera 60 on 7/2. Hb here 10- 12 range, no esa needs.   Chronic resp failure - due to OSA + COPD H/o bladder cancer - recent cystoscopy in February 2023 without evidence of recurrent disease.    Jeremy Moselle MD  [facility] 08/16/2023, 11:04 AM  Recent Labs  Lab 08/10/23 1559 08/10/23 1611 08/12/23 1651 08/13/23 0803 08/14/23 0425 08/15/23 0948  HGB 10.8*   < >  --    < > 11.0* 11.2*  ALBUMIN 3.3*  --   --   --  3.0*  --   CALCIUM 8.6*   < >  --    < > 8.3* 8.4*  PHOS  --    < > 7.4*  --  4.6  --   CREATININE 5.65*   < >  --    < > 4.39* 4.66*  K 5.3*   < >  --    < > 5.4* 4.7   < > = values in this interval not displayed.   No results for input(s): "IRON", "TIBC", "FERRITIN" in the last 168 hours. Inpatient medications:  albuterol  2.5 mg Nebulization Q6H  arformoterol  15 mcg Nebulization BID   budesonide (PULMICORT) nebulizer solution  0.5 mg Nebulization BID   Chlorhexidine Gluconate Cloth  6 each Topical Q0600   ciprofloxacin  500 mg Oral QHS   docusate  100 mg Oral BID   guaiFENesin  600 mg Oral BID   heparin  5,000 Units Subcutaneous Q8H   insulin aspart  0-9 Units Subcutaneous TID WC   metoprolol tartrate  25 mg Oral BID   multivitamin  1 tablet Oral QHS   nicotine  14 mg Transdermal Daily   mouth rinse  15 mL Mouth Rinse 4 times per day   polyethylene glycol  17 g Oral Daily   predniSONE  40 mg Oral Q breakfast   revefenacin  175 mcg Nebulization Daily   sevelamer carbonate  1.6 g Oral TID WC    sodium chloride Stopped (08/10/23 1937)   hydrALAZINE, labetalol, mouth rinse

## 2023-08-16 NOTE — Progress Notes (Signed)
Received patient in bed to unit.  Alert and oriented.  Informed consent signed and in chart.   TX duration:3 hours 5 minutes  Patient tolerated well.  Transported back to the room  Alert, without acute distress.  Hand-off given to patient's nurse.   Access used: AVF/AVG Access issues: high pressure alarms   Total UF removed: 2700 Medication(s) given: heparin bolus 2500 units Post HD VS: see table below Post HD weight: 69.5kg   08/16/23 0142  Vitals  Temp 98.2 F (36.8 C)  Temp Source Oral  BP 113/62  MAP (mmHg) 76  BP Location Right Arm  BP Method Automatic  Patient Position (if appropriate) Lying  Pulse Rate 86  Pulse Rate Source Monitor  ECG Heart Rate 86  Resp 12  Oxygen Therapy  SpO2 97 %  O2 Device Room Air;Nasal Cannula  O2 Flow Rate (L/min) 4 L/min  Patient Activity (if Appropriate) In bed  Pulse Oximetry Type Continuous  During Treatment Monitoring  Blood Flow Rate (mL/min) 0 mL/min  Arterial Pressure (mmHg) -0.2 mmHg  Venous Pressure (mmHg) -0.2 mmHg  TMP (mmHg) -58.79 mmHg  Ultrafiltration Rate (mL/min) 1831 mL/min  Dialysate Flow Rate (mL/min) 0 ml/min  Dialysate Potassium Concentration 2  Dialysate Calcium Concentration 2.5  Duration of HD Treatment -hour(s) 3.08 hour(s)  Cumulative Fluid Removed (mL) per Treatment  2691.23  HD Safety Checks Performed Yes  Intra-Hemodialysis Comments Tolerated well  Post Treatment  Dialyzer Clearance Lightly streaked  Hemodialysis Intake (mL) 0 mL  Liters Processed 68.3  Fluid Removed (mL) 27400 mL  Tolerated HD Treatment Yes  AVG/AVF Arterial Site Held (minutes) 10 minutes  AVG/AVF Venous Site Held (minutes) 10 minutes  Fistula / Graft Left Upper arm Arteriovenous fistula  Placement Date/Time: 10/16/22 1059   Placed prior to admission: No  Orientation: Left  Access Location: Upper arm  Access Type: Arteriovenous fistula  Site Condition No complications  Fistula / Graft Assessment  Present;Thrill;Bruit;Aneurysm present  Status Deaccessed;Flushed;Patent      [facility]

## 2023-08-16 NOTE — TOC Progression Note (Signed)
Transition of Care [facility]) - Progression Note    Patient Details  Name: Jeremy [name] MRN: [id number] Date of Birth: 21-Aug-1941  [facility] CM/SW Contact  Mearl Latin, LCSW Phone Number: 08/16/2023, 11:02 AM  Clinical Narrative:    Awaiting response from [facility] on Monday. Patient documented on room air today. CSW completed Fl2 in the event patient will require [facility] SNF instead with dialysis transport.     Expected Discharge Plan: IP Rehab Facility Barriers to Discharge: Continued Medical Work up  Expected Discharge Plan and Services   Discharge Planning Services: CM Consult   Living arrangements for the past 2 months: Single Family Home                                       Social Determinants of Health (SDOH) Interventions SDOH Screenings   Food Insecurity: No Food Insecurity (08/14/2023)  Housing: Low Risk  (08/14/2023)  Transportation Needs: No Transportation Needs (08/14/2023)  Utilities: Not At Risk (08/14/2023)  Depression (PHQ2-9): Low Risk  (05/19/2023)  Tobacco Use: High Risk (08/14/2023)    Readmission Risk Interventions     No data to display

## 2023-08-16 NOTE — Progress Notes (Signed)
PROGRESS NOTE    Jeremy [name]  ZOX:[id number] DOB: 1941/09/22 DOA: 08/10/2023 PCP: Everrett Coombe, DO   Brief Narrative: 82 year old with past medical history significant for ESRD on hemodialysis TThS, who presented with Somnolent, wife could not wake him up, in the ED he was found to be hypoxic, oxygen saturation 50%, he only used oxygen intermittently.  He completed hemodialysis the day prior to admission.  He was noted to be coughing more.  He continues to smoke 1 pack/day.  Patient failed a trial on BiPAP and he was intubated on 9/29.  He was admitted with acute metabolic encephalopathy secondary to acidosis, shock, sepsis secondary to urinary tract infection versus pneumonia.  He required IV pressors and admitted by CCM.  He was  extubated 10/01. Off pressors.  Care transferred to triad 10/02.     Assessment & Plan:   Active Problems:   Hypoxia   Acute on chronic respiratory failure with hypercapnia (HCC)   Malnutrition of moderate degree  1-Acute metabolic encephalopathy, in the setting of acidosis, infection, hypoxia, Hypercapnia.  -CT Head: No acute intracranial abnormality -Patient became more confuse 10/03., suspect delirium. Change  cefepime to Cipro. -10/04: patient was Lethargic. Placed on BIPAP. Improved.  He is alert and conversant today.   Severe Sepsis,  shock required IV pressors -In the setting of pneumonia and UTI -He has been off of pressors -Chest x-ray on 9/29: Showed right lower lobe atelectasis or pneumonia. -Predatory culture growing Pseudomonas -Urine culture grew: Enterbacter Cloacae.  -Plan to treat for 7 days antibiotics. Received 4 days of Cefepime. Will complete 3 days of Cipro.   Acute on chronic hypoxic respiratory failure with hypercapnia OSA not on CPAP -Patient require intubation 9/29, extubated 10/01. -Continue with bronchodilators -Transition to oral prednisone.  -Respiratory failure could be in the setting of acute pulmonary edema concern  he may under dialyzed due to change in hemodialysis regimen -Concern for pneumonia, sputum culture growing Pseudomonas -Respiratory toilet, flutter valve, incentive spirometry Repeated chest x ray 10/4; Diffuse bilateral interstitial pulmonary opacity, consistent with edema or atypical/viral infection. Small layering left pleural effusion. BNP elevated. Placed on BIPAP , had exta HD 2.7 L removed.  He was on Room air this morning now on 2 L. Much improved to day,  Fluid restriction.   ESRD on hemodialysis Hyponatremia: Correction with HD.  Hyperkalemia: Correction with HD.  Anion gap metabolic acidosis Had HD last night.    Mild Elevation of troponins In the setting of sepsis.  No chest pain.  Hypertension; on metoprolol  Anemia of chronic kidney disease Monitor Hb.   Deconditioning Urine Retention; Continue self catheterization.   Dysphagia;  Evaluated by speech. On dysphagia 1 diet  Consult speech    Nutrition Problem: Moderate Malnutrition Etiology: chronic illness (ESRD/HD, COPD)    Signs/Symptoms: moderate fat depletion, severe muscle depletion    Interventions: Tube feeding, Refer to RD note for recommendations  Estimated body mass index is 21.98 kg/m as calculated from the following:   Height as of this encounter: 5\' 10"  (1.778 m).   Weight as of this encounter: 69.5 kg.   DVT prophylaxis: Heparin  Code Status: Full code Family Communication: Wife on 10/05 Disposition Plan:  Status is: Inpatient Remains inpatient appropriate because: management of resp failure. No medical Stable to transfer to Rehab today.     Consultants:  CCM Nephrology   Procedures:  Mechanical intubation.   Antimicrobials:    Subjective: He is more awake, less confuse, answering questions. Off  BIPAP>   Objective: Vitals:   08/16/23 0200 08/16/23 0338 08/16/23 0743 08/16/23 0803  BP:  134/72 (!) 131/54   Pulse:  86 (!) 44   Resp:  20 20   Temp:  97.8 F (36.6 C)  97.9 F (36.6 C)   TempSrc:  Oral Oral   SpO2: 100% 100% 93% 96%  Weight:      Height:        Intake/Output Summary (Last 24 hours) at 08/16/2023 0849 Last data filed at 08/16/2023 0142 Gross per 24 hour  Intake 120 ml  Output 2700 ml  Net -2580 ml   Filed Weights   08/15/23 0254 08/15/23 2114 08/16/23 0141  Weight: 73.4 kg 72.3 kg 69.5 kg    Examination:  General exam: Alert, conversant.  Respiratory system: BL air movement.  Cardiovascular system: S 1, S 2 RRR Gastrointestinal system: BS present, soft nt Central nervous system: alert, answer questions.  Extremities: no edema    Data Reviewed: I have personally reviewed following labs and imaging studies  CBC: Recent Labs  Lab 08/11/23 0244 08/12/23 0750 08/13/23 0803 08/14/23 0425 08/15/23 0948  WBC 6.4 10.6* 11.3* 10.4 8.9  HGB 10.2* 10.0* 10.3* 11.0* 11.2*  HCT 30.8* 30.8* 32.3* 33.7* 35.4*  MCV 97.5 96.9 96.4 98.0 97.0  PLT 251 256 267 263 291   Basic Metabolic Panel: Recent Labs  Lab 08/11/23 0244 08/11/23 1739 08/12/23 0750 08/12/23 1651 08/13/23 0803 08/14/23 0425 08/15/23 0948  NA 134*  --  133*  --  132* 133* 134*  K 4.7  --  5.1  --  5.9* 5.4* 4.7  CL 96*  --  91*  --  93* 94* 93*  CO2 22  --  23  --  22 24 27   GLUCOSE 126*  --  133*  --  134* 117* 132*  BUN 44*  --  50*  --  91* 55* 63*  CREATININE 6.08*  --  5.13*  --  7.02* 4.39* 4.66*  CALCIUM 8.5*  --  8.7*  --  8.5* 8.3* 8.4*  MG 2.5* 1.9 2.2 2.3  --   --   --   PHOS 5.2* 5.2* 6.5* 7.4*  --  4.6  --    GFR: Estimated Creatinine Clearance: 11.8 mL/min (A) (by C-G formula based on SCr of 4.66 mg/dL (H)). Liver Function Tests: Recent Labs  Lab 08/10/23 1559 08/14/23 0425  AST 13*  --   ALT 10  --   ALKPHOS 65  --   BILITOT 0.6  --   PROT 7.2  --   ALBUMIN 3.3* 3.0*   No results for input(s): "LIPASE", "AMYLASE" in the last 168 hours. No results for input(s): "AMMONIA" in the last 168 hours. Coagulation Profile: No  results for input(s): "INR", "PROTIME" in the last 168 hours. Cardiac Enzymes: No results for input(s): "CKTOTAL", "CKMB", "CKMBINDEX", "TROPONINI" in the last 168 hours. BNP (last 3 results) No results for input(s): "PROBNP" in the last 8760 hours. HbA1C: No results for input(s): "HGBA1C" in the last 72 hours.  CBG: Recent Labs  Lab 08/15/23 0809 08/15/23 1153 08/15/23 1625 08/15/23 2047 08/16/23 0742  GLUCAP 137* 93 112* 153* 100*   Lipid Profile: No results for input(s): "CHOL", "HDL", "LDLCALC", "TRIG", "CHOLHDL", "LDLDIRECT" in the last 72 hours. Thyroid Function Tests: No results for input(s): "TSH", "T4TOTAL", "FREET4", "T3FREE", "THYROIDAB" in the last 72 hours. Anemia Panel: No results for input(s): "VITAMINB12", "FOLATE", "FERRITIN", "TIBC", "IRON", "RETICCTPCT" in the last 72  hours. Sepsis Labs: Recent Labs  Lab 08/10/23 1713 08/10/23 2326 08/11/23 0841 08/11/23 1519  LATICACIDVEN 0.8 2.1* 1.3 1.2    Recent Results (from the past 240 hour(s))  SARS Coronavirus 2 by RT PCR (hospital order, performed in [facility] hospital lab) *cepheid single result test* Anterior Nasal Swab     Status: None   Collection Time: 08/10/23  4:08 PM   Specimen: Anterior Nasal Swab  Result Value Ref Range Status   SARS Coronavirus 2 by RT PCR NEGATIVE NEGATIVE Final    Comment: Performed at [facility] Lab, [address], [place], [place] [address]  Blood culture (routine x 2)     Status: None   Collection Time: 08/10/23  5:18 PM   Specimen: BLOOD RIGHT ARM  Result Value Ref Range Status   Specimen Description BLOOD RIGHT ARM  Final   Special Requests   Final    BOTTLES DRAWN AEROBIC AND ANAEROBIC Blood Culture adequate volume   Culture   Final    NO GROWTH 5 DAYS Performed at [facility] Lab, [address] [place], [place] [address]    Report Status 08/15/2023 FINAL  Final  Blood culture (routine x 2)     Status: None   Collection Time: 08/10/23  5:47 PM    Specimen: BLOOD RIGHT ARM  Result Value Ref Range Status   Specimen Description BLOOD RIGHT ARM  Final   Special Requests   Final    BOTTLES DRAWN AEROBIC AND ANAEROBIC Blood Culture adequate volume   Culture   Final    NO GROWTH 5 DAYS Performed at [facility], [address], [place], [place] [address]    Report Status 08/15/2023 FINAL  Final  Urine Culture (for pregnant, neutropenic or urologic patients or patients with an indwelling urinary catheter)     Status: Abnormal   Collection Time: 08/10/23  6:16 PM   Specimen: In/Out Cath Urine  Result Value Ref Range Status   Specimen Description IN/OUT CATH URINE  Final   Special Requests   Final    NONE Performed at [facility] Lab, [address], [place], [place] [address]    Culture >=100,000 COLONIES/mL ENTEROBACTER CLOACAE (A)  Final   Report Status 08/13/2023 FINAL  Final   Organism ID, Bacteria ENTEROBACTER CLOACAE (A)  Final      Susceptibility   Enterobacter cloacae - MIC*    CEFEPIME <=0.12 SENSITIVE Sensitive     CIPROFLOXACIN <=0.25 SENSITIVE Sensitive     GENTAMICIN <=1 SENSITIVE Sensitive     IMIPENEM 1 SENSITIVE Sensitive     NITROFURANTOIN 32 SENSITIVE Sensitive     TRIMETH/SULFA <=20 SENSITIVE Sensitive     PIP/TAZO <=4 SENSITIVE Sensitive     * >=100,000 COLONIES/mL ENTEROBACTER CLOACAE  Culture, Respiratory w Gram Stain     Status: None   Collection Time: 08/11/23  8:07 AM   Specimen: Tracheal Aspirate; Respiratory  Result Value Ref Range Status   Specimen Description TRACHEAL ASPIRATE  Final   Special Requests NONE  Final   Gram Stain   Final    FEW WBC PRESENT, PREDOMINANTLY PMN ABUNDANT GRAM POSITIVE COCCI IN CLUSTERS FEW GRAM POSITIVE COCCI IN CHAINS FEW GRAM POSITIVE RODS Performed at [facility] Lab, [address], [place], [place] [address]    Culture FEW PSEUDOMONAS AERUGINOSA  Final   Report Status 08/13/2023 FINAL  Final   Organism ID, Bacteria PSEUDOMONAS AERUGINOSA  Final  Susceptibility   Pseudomonas aeruginosa - MIC*    CEFTAZIDIME 4 SENSITIVE Sensitive     CIPROFLOXACIN <=0.25 SENSITIVE Sensitive     GENTAMICIN 2 SENSITIVE Sensitive     IMIPENEM 1 SENSITIVE Sensitive     PIP/TAZO 8 SENSITIVE Sensitive     CEFEPIME 2 SENSITIVE Sensitive     * FEW PSEUDOMONAS AERUGINOSA  Respiratory (~20 pathogens) panel by PCR     Status: None   Collection Time: 08/11/23  6:50 PM  Result Value Ref Range Status   Adenovirus NOT DETECTED NOT DETECTED Final   Coronavirus 229E NOT DETECTED NOT DETECTED Final    Comment: (NOTE) The Coronavirus on the Respiratory Panel, DOES NOT test for the novel  Coronavirus (2019 nCoV)    Coronavirus HKU1 NOT DETECTED NOT DETECTED Final   Coronavirus NL63 NOT DETECTED NOT DETECTED Final   Coronavirus OC43 NOT DETECTED NOT DETECTED Final   Metapneumovirus NOT DETECTED NOT DETECTED Final   Rhinovirus / Enterovirus NOT DETECTED NOT DETECTED Final   Influenza A NOT DETECTED NOT DETECTED Final   Influenza B NOT DETECTED NOT DETECTED Final   Parainfluenza Virus 1 NOT DETECTED NOT DETECTED Final   Parainfluenza Virus 2 NOT DETECTED NOT DETECTED Final   Parainfluenza Virus 3 NOT DETECTED NOT DETECTED Final   Parainfluenza Virus 4 NOT DETECTED NOT DETECTED Final   Respiratory Syncytial Virus NOT DETECTED NOT DETECTED Final   Bordetella pertussis NOT DETECTED NOT DETECTED Final   Bordetella Parapertussis NOT DETECTED NOT DETECTED Final   Chlamydophila pneumoniae NOT DETECTED NOT DETECTED Final   Mycoplasma pneumoniae NOT DETECTED NOT DETECTED Final    Comment: Performed at [facility], [address], [place], [place] [address]  MRSA Next Gen by PCR, Nasal     Status: None   Collection Time: 08/12/23 10:12 AM   Specimen: Nasal Mucosa; Nasal Swab  Result Value Ref Range Status   MRSA by PCR Next Gen NOT DETECTED NOT DETECTED Final    Comment: (NOTE) The GeneXpert MRSA Assay (FDA approved for NASAL specimens only), is one component  of a comprehensive MRSA colonization surveillance program. It is not intended to diagnose MRSA infection nor to guide or monitor treatment for MRSA infections. Test performance is not FDA approved in patients less than 70 years old. Performed at [facility] Lab, [address], [place], [place] [address]          Radiology Studies: DG CHEST PORT 1 VIEW  Result Date: 08/15/2023 CLINICAL DATA:  Hypoxia, shortness of breath EXAM: PORTABLE CHEST 1 VIEW COMPARISON:  08/10/2023 FINDINGS: The heart size and mediastinal contours are within normal limits. Diffuse bilateral interstitial pulmonary opacity. Small layering left pleural effusion. The visualized skeletal structures are unremarkable. IMPRESSION: Diffuse bilateral interstitial pulmonary opacity, consistent with edema or atypical/viral infection. Small layering left pleural effusion. Electronically Signed   By: Jearld Lesch M.D.   On: 08/15/2023 08:24        Scheduled Meds:  albuterol  2.5 mg Nebulization Q6H   arformoterol  15 mcg Nebulization BID   budesonide (PULMICORT) nebulizer solution  0.5 mg Nebulization BID   Chlorhexidine Gluconate Cloth  6 each Topical Q0600   ciprofloxacin  500 mg Oral QHS   docusate  100 mg Oral BID   guaiFENesin  600 mg Oral BID   heparin  5,000 Units Subcutaneous Q8H   insulin aspart  0-9 Units Subcutaneous TID WC   metoprolol tartrate  25 mg Oral BID   multivitamin  1 tablet Oral QHS   nicotine  14 mg Transdermal Daily   mouth rinse  15 mL Mouth Rinse 4 times per day   polyethylene glycol  17 g Oral Daily   predniSONE  40 mg Oral Q breakfast   revefenacin  175 mcg Nebulization Daily   sevelamer carbonate  1.6 g Oral TID WC   Continuous Infusions:  sodium chloride Stopped (08/10/23 1937)     LOS: 6 days    Time spent: 35 minutes.     Alba Cory, MD [facility]   If 7PM-7AM, please contact night-coverage [email or web address]  08/16/2023, 8:49 AM

## 2023-08-16 NOTE — [place] FL2 (Cosign Needed Addendum)
[place] MEDICAID FL2 LEVEL OF CARE FORM     IDENTIFICATION  Patient Name: Jeremy [name] Birthdate: Feb 10, 1941 Sex: male Admission Date (Current Location): 08/10/2023  [facility] and [place] Number:  Producer, television/film/video and Address:  [facility], [address], [place], [place] [address]      Provider Number: [id number]  Attending Physician Name and Address:  Alba Cory, MD  Relative Name and Phone Number:  [name] (spouse) [phone]    Current Level of Care: Hospital Recommended Level of Care: Skilled Nursing Facility Prior Approval Number:    Date Approved/Denied:   PASRR Number: [id number]  Discharge Plan: SNF    Current Diagnoses: Patient Active Problem List   Diagnosis Date Noted   Malnutrition of moderate degree 08/13/2023   Hypoxia 08/12/2023   Acute on chronic respiratory failure with hypercapnia (HCC) 08/12/2023   ESRD (end stage renal disease) (HCC) 10/24/2022   Gait instability 10/24/2022   AKI (acute kidney injury) (HCC) 09/12/2022   Lethargy 08/01/2022   Neck pain on right side 04/23/2022   Cellulitis 01/08/2022   Intermediate stage nonexudative age-related macular degeneration of left eye 08/17/2020   Pseudophakia 08/17/2020   Degenerative retinal drusen of left eye 08/17/2020   Advanced nonexudative age-related macular degeneration of right eye with subfoveal involvement 08/17/2020   Senile purpura (HCC) 08/10/2020   Aortic atherosclerosis (HCC) 08/10/2020   Cerumen impaction 08/10/2020   Skin lesion 07/06/2019   Hyperlipidemia 01/05/2019   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   Cigarette smoker 03/17/2018   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   OSA (obstructive sleep apnea) 05/23/2016   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   Osteoarthritis 01/29/2010   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   History of bladder cancer 10/10/2008   Chronic bronchitis (HCC)  10/10/2008   Diverticulosis of large intestine 10/10/2008   COLONIC POLYPS 12/07/2007   Essential hypertension 12/07/2007   Hemorrhoids 12/07/2007   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    Orientation RESPIRATION BLADDER Height & Weight     Self, Place  O2 (Nasal Cannula 5 liters) Continent Weight: 153 lb 3.5 oz (69.5 kg) Height:  5\' 10"  (177.8 cm)  BEHAVIORAL SYMPTOMS/MOOD NEUROLOGICAL BOWEL NUTRITION STATUS      Continent Diet (Please see discharge summary)  AMBULATORY STATUS COMMUNICATION OF NEEDS Skin   Limited Assist Verbally Other (Comment) (Abrasion,shoulder,elbow,thigh,R,Wound/Incision LDAs,Wound/Incision open or dehisced,skin tear,shoulder,L,posterior)                       Personal Care Assistance Level of Assistance  Bathing, Feeding, Dressing Bathing Assistance: Limited assistance Feeding assistance: Independent Dressing Assistance: Limited assistance     Functional Limitations Info  Sight, Hearing, Speech Sight Info: Impaired Hearing Info: Impaired Speech Info: Adequate    SPECIAL CARE FACTORS FREQUENCY  PT (By licensed PT), OT (By licensed OT)     PT Frequency: 5x min weekly OT Frequency: 5x min weekly            Contractures Contractures Info: Not present    Additional Factors Info  Code Status, Allergies, Insulin Sliding Scale Code Status Info: FULL Allergies Info: Sulfa Antibiotics,Penicillins,Tape   Insulin Sliding Scale Info: insulin aspart (novoLOG) injection 0-9 Units 3 times daily with meals       Current Medications (08/16/2023):  This is the current hospital active medication list Current Facility-Administered Medications  Medication Dose Route Frequency Provider Last Rate Last Admin  0.9 %  sodium chloride infusion  250 mL Intravenous Continuous Rondel Baton, MD   Stopped at 08/10/23 1937   albuterol (PROVENTIL) (2.5 MG/3ML) 0.083% nebulizer solution 2.5 mg  2.5 mg Nebulization Q6H Regalado,  Belkys A, MD   2.5 mg at 08/16/23 0803   arformoterol (BROVANA) nebulizer solution 15 mcg  15 mcg Nebulization BID Duayne Cal, NP   15 mcg at 08/16/23 0803   budesonide (PULMICORT) nebulizer solution 0.5 mg  0.5 mg Nebulization BID Duayne Cal, NP   0.5 mg at 08/16/23 8657   Chlorhexidine Gluconate Cloth 2 % PADS 6 each  6 each Topical Q0600 Delano Metz, MD       ciprofloxacin (CIPRO) tablet 500 mg  500 mg Oral QHS Rollene Fare, RPH   500 mg at 08/15/23 2015   docusate (COLACE) 50 MG/5ML liquid 100 mg  100 mg Oral BID Regalado, Belkys A, MD   100 mg at 08/16/23 0552   guaiFENesin (MUCINEX) 12 hr tablet 600 mg  600 mg Oral BID Regalado, Belkys A, MD   600 mg at 08/15/23 2016   heparin injection 5,000 Units  5,000 Units Subcutaneous Q8H Duayne Cal, NP   5,000 Units at 08/16/23 8469   hydrALAZINE (APRESOLINE) injection 10 mg  10 mg Intravenous Q4H PRN Cristopher Peru, PA-C       insulin aspart (novoLOG) injection 0-9 Units  0-9 Units Subcutaneous TID WC Howerter, Justin B, DO   1 Units at 08/15/23 0851   labetalol (NORMODYNE) injection 10 mg  10 mg Intravenous Q10 min PRN Pia Mau D, PA-C   10 mg at 08/12/23 2015   metoprolol tartrate (LOPRESSOR) tablet 25 mg  25 mg Oral BID Regalado, Belkys A, MD   25 mg at 08/16/23 1034   multivitamin (RENA-VIT) tablet 1 tablet  1 tablet Oral QHS Regalado, Belkys A, MD   1 tablet at 08/15/23 2015   nicotine (NICODERM CQ - dosed in mg/24 hours) patch 14 mg  14 mg Transdermal Daily Regalado, Belkys A, MD   14 mg at 08/16/23 0951   Oral care mouth rinse  15 mL Mouth Rinse 4 times per day Martina Sinner, MD   15 mL at 08/16/23 0940   Oral care mouth rinse  15 mL Mouth Rinse PRN Martina Sinner, MD       polyethylene glycol (MIRALAX / GLYCOLAX) packet 17 g  17 g Oral Daily Doristine Counter, RPH   17 g at 08/15/23 0836   predniSONE (DELTASONE) tablet 40 mg  40 mg Oral Q breakfast Regalado, Belkys A, MD   40 mg at 08/16/23 1034    revefenacin (YUPELRI) nebulizer solution 175 mcg  175 mcg Nebulization Daily Duayne Cal, NP   175 mcg at 08/16/23 0803   sevelamer carbonate (RENVELA) packet 1.6 g  1.6 g Oral TID WC Regalado, Belkys A, MD   1.6 g at 08/16/23 0940     Discharge Medications: Please see discharge summary for a list of discharge medications.  Relevant Imaging Results:  Relevant Lab Results:   Additional Information [id number]. Requires outpatient dialysis at [facility] [place] on TTS 12:00 chair time   Mearl Latin, LCSW

## 2023-08-16 NOTE — Progress Notes (Addendum)
Chaplain responded to call for HCPOA.  Pt and his son were both in the room.  Pt's wife arrived shortly.  Chaplain explained HCPOA to everyone.  MR Hosterman (being of sound mind as determined by this Chaplain) WANTS HIS SON [name] TO BE HIS ADVOCATE, because the medical-speak often confuses his wife which causes her stress because she she doesn't understand what is being said and doesn't want to make the wrong decision for her husband.  .  Their son [name] lives in [place] and will be on hand whenever called to make medical decision for his father.   And can be here in person when needed.    [name] requests his medical chart be changed to reflect his wishes that [name] is in #1 position to be called, followed by his wife [name] in 2nd position.   Chaplain connected with Mr. Elison nurse re placing a Spiritual Consult for notary visit on Monday and to put [facility] in first place as Advocate followed by Mrs. Kopf.     Vernell Morgans Chaplain.

## 2023-08-16 NOTE — Progress Notes (Signed)
Speech Language Pathology Treatment: Dysphagia  Patient Details Name: Jeremy [name] MRN: [id number] DOB: 02/29/1941 Today's Date: 08/16/2023 Time: 1430-1450 SLP Time Calculation (min) (ACUTE ONLY): 20 min  Assessment / Plan / Recommendation Clinical Impression  Patient seen by SLP for skilled treatment focused on dysphagia goals. Just prior to SLP entering room,, patient heard to be coughing. When SLP entered room, he was sitting at EOB and spouse in room. She reported that patient had received a thin liquid juice on his lunch tray. SLP confirmed this and confirmed that meal ticket was correct (Dys 1 puree, pudding thick liquids). SLP then educated patient and spouse on his dysphagia, aspiration risk, chin tuck posture for airway protection and reviewed briefly MBS results. Patient with intermittent congested sounding coughing throughout session and he did perform oral suctioning independently. After reviewing MBS and discussing with patient and spouse,  SLP recommended to advance liquids to honey thick at this time and will continue to follow.     HPI HPI: Jeremy [name] is an 82 y/o gentleman who presented to the hospital for being somnolent today. His wife could barely wake him up earlier and decided to bring him to the ED. When she checked his pulse ox at home he had saturations in the 50s (not on his oxygen). Pt required intubation 9/29-10/1. CXR 9/29: "Right lower lobe atelectasis or pneumonia." Pt with a history of smoking, ESRD on HD TThS; MBS completed with Dysphagia 1/pudding thick liquids recommended. ST f/u for diet progression/tolerance/dysphagia tx.      SLP Plan  Continue with current plan of care      Recommendations for follow up therapy are one component of a multi-disciplinary discharge planning process, led by the attending physician.  Recommendations may be updated based on patient status, additional functional criteria and insurance authorization.    Recommendations  Diet  recommendations: Dysphagia 1 (puree);Honey-thick liquid Liquids provided via: Cup;Teaspoon Medication Administration: Whole meds with puree Supervision: Patient able to self feed;Intermittent supervision to cue for compensatory strategies Compensations: Slow rate;Small sips/bites;Effortful swallow;Chin tuck Postural Changes and/or Swallow Maneuvers: Seated upright 90 degrees                  Oral care BID   Intermittent Supervision/Assistance Dysphagia, oropharyngeal phase (R13.12)     Continue with current plan of care     Angela Nevin, MA, CCC-SLP Speech Therapy

## 2023-08-17 DIAGNOSIS — J9622 Acute and chronic respiratory failure with hypercapnia: Secondary | ICD-10-CM | POA: Diagnosis not present

## 2023-08-17 LAB — GLUCOSE, CAPILLARY
Glucose-Capillary: 121 mg/dL — ABNORMAL HIGH (ref 70–99)
Glucose-Capillary: 112 mg/dL — ABNORMAL HIGH (ref 70–99)
Glucose-Capillary: 158 mg/dL — ABNORMAL HIGH (ref 70–99)
Glucose-Capillary: 219 mg/dL — ABNORMAL HIGH (ref 70–99)
Glucose-Capillary: 114 mg/dL — ABNORMAL HIGH (ref 70–99)

## 2023-08-17 MED ORDER — ALBUTEROL SULFATE (2.5 MG/3ML) 0.083% IN NEBU
2.5000 mg | INHALATION_SOLUTION | Freq: Three times a day (TID) | RESPIRATORY_TRACT | Status: DC
  Administered 2023-08-17 – 2023-08-19 (×5): 2.5 mg via RESPIRATORY_TRACT
  Filled 2023-08-17 (×6): qty 3

## 2023-08-17 NOTE — Progress Notes (Signed)
Speech Language Pathology Treatment: Dysphagia  Patient Details Name: KAUAN KLOOSTERMAN MRN: [id number] DOB: 04-24-1941 Today's Date: 08/17/2023 Time: 1410-1420 SLP Time Calculation (min) (ACUTE ONLY): 10 min  Assessment / Plan / Recommendation Clinical Impression  Patient seen by SLP for skilled treatment focused on dysphagia goals. His spouse was present and patient in the process of feeding himself his lunch when SLP arrived. He consumed honey thick liquids by cup and then by spoon sips and puree solids. He did have one delayed, congested sounding cough, but overall he appeared to be tolerating puree solids and honey thick liquids well. He did not exhibit the wet vocal quality or coughing that was observed previous date when he mistakenly consumed some thin liquids. SLP is recommending patient continue on current diet and will follow for toleration, patient and spouse education and ability to advance.     HPI HPI: Mr. Sobol is an 82 y/o gentleman who presented to the hospital for being somnolent today. His wife could barely wake him up earlier and decided to bring him to the ED. When she checked his pulse ox at home he had saturations in the 50s (not on his oxygen). Pt required intubation 9/29-10/1. CXR 9/29: "Right lower lobe atelectasis or pneumonia." Pt with a history of smoking, ESRD on HD TThS; MBS completed with Dysphagia 1/pudding thick liquids recommended. ST f/u for diet progression/tolerance/dysphagia tx.      SLP Plan  Continue with current plan of care      Recommendations for follow up therapy are one component of a multi-disciplinary discharge planning process, led by the attending physician.  Recommendations may be updated based on patient status, additional functional criteria and insurance authorization.    Recommendations  Diet recommendations: Dysphagia 1 (puree);Honey-thick liquid Liquids provided via: Cup;Teaspoon Medication Administration: Whole meds with  puree Supervision: Patient able to self feed;Intermittent supervision to cue for compensatory strategies Compensations: Slow rate;Small sips/bites;Effortful swallow;Chin tuck Postural Changes and/or Swallow Maneuvers: Seated upright 90 degrees                  Oral care BID   Intermittent Supervision/Assistance Dysphagia, oropharyngeal phase (R13.12)     Continue with current plan of care    Angela Nevin, MA, CCC-SLP Speech Therapy

## 2023-08-17 NOTE — Progress Notes (Signed)
[facility] Progress Note  Subjective: had HD last night w/ 2.2 L UF. BP's dropped to 70s. This am no c/o's, eating breakfast, nasal o2.   Vitals:   08/17/23 1200 08/17/23 1300 08/17/23 1400 08/17/23 1442  BP:      Pulse: 89 88 92 93  Resp:      Temp:      TempSrc:      SpO2: 94% 93% 96% 92%  Weight:      Height:        Exam: Alert, on room air Dry mouth No jvd Chest - CTA bilat Cor reg no mrg Abd soft, ntnd  Ext no edema UE/ LE"s   LUA AVF+ bruit     Renal related home meds: - metoprolol xl 50 every day - renvela 2400mg  ac tid    OP HD: SW TTS  3h   73.3kg   400/600  2/2 bath  Heparin 2500  LUA AVF Hectorol 2  Last Mircera 7/2    9/29 CXR - R lower lung atx vs pna, no edema   Assessment/Plan: Acute on chronic resp failure - prob pna and volume components. Off the vent tuesday. Hx of COPD. Pt has poor respiratory reserve and doesn't tolerate volume excess very well here. Getting steroid taper here and po abx w/ cipro.   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 Left at his EDW last Saturday at op unit. Had 5 HD sessions here last week. Next HD Tuesday.  Sepsis / UTI/ PNA- UCx grew enterobacter and sputum cx grew pseudomonas. SP IV cefepime --> now switched to po cipro.   Volume - down to 68kg (5kg under dry wt). BP dropped into 70s w/ yesterday's HD, 2.2 L off. Probably this is lowest dry wt we can get.  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  HTN - getting metoprolol bid  Urinary retention - per pmd Anemia of esrd - Hb here 10- 12 range, no esa needs.  Chronic resp failure - due to OSA + COPD H/o bladder cancer - recent cystoscopy in February 2023 without evidence of recurrent disease.    [name] MD  [facility] 08/17/2023, 3:45 PM  Recent Labs  Lab 08/10/23 1559 08/10/23 1611 08/12/23 1651 08/13/23 0803 08/14/23 0425 08/15/23 0948  HGB 10.8*   < >  --    < > 11.0* 11.2*  ALBUMIN 3.3*  --   --   --  3.0*  --   CALCIUM 8.6*   <  >  --    < > 8.3* 8.4*  PHOS  --    < > 7.4*  --  4.6  --   CREATININE 5.65*   < >  --    < > 4.39* 4.66*  K 5.3*   < >  --    < > 5.4* 4.7   < > = values in this interval not displayed.   No results for input(s): "IRON", "TIBC", "FERRITIN" in the last 168 hours. Inpatient medications:  albuterol  2.5 mg Nebulization TID   arformoterol  15 mcg Nebulization BID   budesonide (PULMICORT) nebulizer solution  0.5 mg Nebulization BID   Chlorhexidine Gluconate Cloth  6 each Topical Q0600   Chlorhexidine Gluconate Cloth  6 each Topical Q0600   docusate  100 mg Oral BID   doxercalciferol  2 mcg Intravenous Q T,Th,Sa-HD   guaiFENesin  600 mg Oral BID   heparin  5,000 Units Subcutaneous Q8H  insulin aspart  0-9 Units Subcutaneous TID WC   metoprolol tartrate  25 mg Oral BID   multivitamin  1 tablet Oral QHS   nicotine  14 mg Transdermal Daily   mouth rinse  15 mL Mouth Rinse 4 times per day   polyethylene glycol  17 g Oral Daily   revefenacin  175 mcg Nebulization Daily   sevelamer carbonate  1.6 g Oral TID WC     hydrALAZINE, labetalol, mouth rinse

## 2023-08-17 NOTE — Progress Notes (Signed)
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  He completed hemodialysis the day prior to admission.  He was noted to be coughing more.  He continues to smoke 1 pack/day.  Patient failed a trial on BiPAP and he was intubated on 9/29.  He was admitted with acute metabolic encephalopathy secondary to acidosis, shock, sepsis secondary to urinary tract infection versus pneumonia.  He required IV pressors and admitted by CCM.  He was extubated 10/01. Off pressors.  Care transferred to [facility] 10/02.     Assessment & Plan:   Active Problems:   Hypoxia   Acute on chronic respiratory failure with hypercapnia (HCC)   Malnutrition of moderate degree  1-Acute metabolic encephalopathy, in the setting of acidosis, infection, hypoxia, Hypercapnia.  -CT Head: No acute intracranial abnormality. -Patient became more confuse 10/03. Suspect delirium. Change  cefepime to Cipro. -10/04: patient was Lethargic. Placed on BIPAP. Improved.  He is alert and conversant today.   Severe Sepsis,  shock required IV pressors -In the setting of pneumonia and UTI -He has been off of pressors -Chest x-ray on 9/29: Showed right lower lobe atelectasis or pneumonia. -Predatory culture growing Pseudomonas -Urine culture grew: Enterbacter Cloacae.  -Completed 7 days antibiotics   Acute on chronic hypoxic respiratory failure with hypercapnia OSA not on CPAP -Patient require intubation 9/29, extubated 10/01. -Continue with bronchodilators -Transition to oral prednisone.  -Respiratory failure could be in the setting of acute pulmonary edema concern he may under dialyzed due to change in hemodialysis regimen -Concern  for pneumonia, sputum culture growing Pseudomonas -Respiratory toilet, flutter valve, incentive spirometry -Repeated chest x ray 10/4; Diffuse bilateral interstitial pulmonary opacity, consistent with edema or atypical/viral infection. Small layering left pleural effusion. -BNP elevated. Placed on BIPAP , had exta HD 2.7 L removed.  -Fluid restriction.  -he is on 2 L oxygen, he uses 2 L at home.  Stable today.   ESRD on hemodialysis Hyponatremia: Correction with HD.  Hyperkalemia: Correction with HD.  Anion gap metabolic acidosis   Mild Elevation of troponins In the setting of sepsis.  No chest pain.  Hypertension; on metoprolol  Anemia of chronic kidney disease Monitor Hb.   Deconditioning Urine Retention; Continue self catheterization.   Dysphagia;  Evaluated by speech. On dysphagia 1 diet  Consult speech    Nutrition Problem: Moderate Malnutrition Etiology: chronic illness (ESRD/HD, COPD)    Signs/Symptoms: moderate fat depletion, severe muscle depletion    Interventions: Tube feeding, Refer to RD note for recommendations  Estimated body mass index is 21.56 kg/m as calculated from the following:   Height as of this encounter: 5\' 10"  (1.778 m).   Weight as of this encounter: 68.2 kg.   DVT prophylaxis: Heparin  Code Status: Full code Family Communication: Wife on 10/05 Disposition Plan:  Status is: Inpatient Remains inpatient appropriate because: management of resp failure. No medical Stable to transfer to Rehab today.     Consultants:  CCM Nephrology   Procedures:  Mechanical intubation.   Antimicrobials:    Subjective: He is alert, less confusion.  He is breathing better.   Objective: Vitals:   08/17/23 1000 08/17/23 1100 08/17/23 1145 08/17/23 1200  BP:   122/67  Pulse: 96 95 95 89  Resp:   20   Temp:   97.9 F (36.6 C)   TempSrc:   Oral   SpO2: 93% 96% (!) 88% 94%  Weight:      Height:        Intake/Output Summary (Last 24  hours) at 08/17/2023 1448 Last data filed at 08/17/2023 0900 Gross per 24 hour  Intake 240 ml  Output 2200 ml  Net -1960 ml   Filed Weights   08/15/23 2114 08/16/23 0141 08/17/23 0437  Weight: 72.3 kg 69.5 kg 68.2 kg    Examination:  General exam: Alert, conversant.  Respiratory system: Normal Resp effort, CTA Cardiovascular system: S 1, S 2 RRR Gastrointestinal system: BS present, soft, nt Central nervous system: alert Extremities: no edema    Data Reviewed: I have personally reviewed following labs and imaging studies  CBC: Recent Labs  Lab 08/11/23 0244 08/12/23 0750 08/13/23 0803 08/14/23 0425 08/15/23 0948  WBC 6.4 10.6* 11.3* 10.4 8.9  HGB 10.2* 10.0* 10.3* 11.0* 11.2*  HCT 30.8* 30.8* 32.3* 33.7* 35.4*  MCV 97.5 96.9 96.4 98.0 97.0  PLT 251 256 267 263 291   Basic Metabolic Panel: Recent Labs  Lab 08/11/23 0244 08/11/23 1739 08/12/23 0750 08/12/23 1651 08/13/23 0803 08/14/23 0425 08/15/23 0948  NA 134*  --  133*  --  132* 133* 134*  K 4.7  --  5.1  --  5.9* 5.4* 4.7  CL 96*  --  91*  --  93* 94* 93*  CO2 22  --  23  --  22 24 27   GLUCOSE 126*  --  133*  --  134* 117* 132*  BUN 44*  --  50*  --  91* 55* 63*  CREATININE 6.08*  --  5.13*  --  7.02* 4.39* 4.66*  CALCIUM 8.5*  --  8.7*  --  8.5* 8.3* 8.4*  MG 2.5* 1.9 2.2 2.3  --   --   --   PHOS 5.2* 5.2* 6.5* 7.4*  --  4.6  --    GFR: Estimated Creatinine Clearance: 11.6 mL/min (A) (by C-G formula based on SCr of 4.66 mg/dL (H)). Liver Function Tests: Recent Labs  Lab 08/10/23 1559 08/14/23 0425  AST 13*  --   ALT 10  --   ALKPHOS 65  --   BILITOT 0.6  --   PROT 7.2  --   ALBUMIN 3.3* 3.0*   No results for input(s): "LIPASE", "AMYLASE" in the last 168 hours. No results for input(s): "AMMONIA" in the last 168 hours. Coagulation Profile: No results for input(s): "INR", "PROTIME" in the last 168 hours. Cardiac Enzymes: No results for input(s): "CKTOTAL", "CKMB", "CKMBINDEX", "TROPONINI" in  the last 168 hours. BNP (last 3 results) No results for input(s): "PROBNP" in the last 8760 hours. HbA1C: No results for input(s): "HGBA1C" in the last 72 hours.  CBG: Recent Labs  Lab 08/16/23 1236 08/16/23 1637 08/17/23 0323 08/17/23 0734 08/17/23 1203  GLUCAP 211* 151* 121* 112* 158*   Lipid Profile: No results for input(s): "CHOL", "HDL", "LDLCALC", "TRIG", "CHOLHDL", "LDLDIRECT" in the last 72 hours. Thyroid Function Tests: No results for input(s): "TSH", "T4TOTAL", "FREET4", "T3FREE", "THYROIDAB" in the last 72 hours. Anemia Panel: No results for input(s): "VITAMINB12", "FOLATE", "FERRITIN", "TIBC", "IRON", "RETICCTPCT" in the last 72 hours. Sepsis Labs: Recent Labs  Lab 08/10/23 1713 08/10/23 2326 08/11/23 0841 08/11/23 1519  LATICACIDVEN 0.8 2.1* 1.3 1.2    Recent Results (from the past  240 hour(s))  SARS Coronavirus 2 by RT PCR (hospital order, performed in [facility] hospital lab) *cepheid single result test* Anterior Nasal Swab     Status: None   Collection Time: 08/10/23  4:08 PM   Specimen: Anterior Nasal Swab  Result Value Ref Range Status   SARS Coronavirus 2 by RT PCR NEGATIVE NEGATIVE Final    Comment: Performed at [facility], [address], [place], [place] [address]  Blood culture (routine x 2)     Status: None   Collection Time: 08/10/23  5:18 PM   Specimen: BLOOD RIGHT ARM  Result Value Ref Range Status   Specimen Description BLOOD RIGHT ARM  Final   Special Requests   Final    BOTTLES DRAWN AEROBIC AND ANAEROBIC Blood Culture adequate volume   Culture   Final    NO GROWTH 5 DAYS Performed at [facility], [address] [place], [place] [address]    Report Status 08/15/2023 FINAL  Final  Blood culture (routine x 2)     Status: None   Collection Time: 08/10/23  5:47 PM   Specimen: BLOOD RIGHT ARM  Result Value Ref Range Status   Specimen Description BLOOD RIGHT ARM  Final   Special Requests   Final    BOTTLES DRAWN AEROBIC  AND ANAEROBIC Blood Culture adequate volume   Culture   Final    NO GROWTH 5 DAYS Performed at [facility], [address] [place], [place] [address]    Report Status 08/15/2023 FINAL  Final  Urine Culture (for pregnant, neutropenic or urologic patients or patients with an indwelling urinary catheter)     Status: Abnormal   Collection Time: 08/10/23  6:16 PM   Specimen: In/Out Cath Urine  Result Value Ref Range Status   Specimen Description IN/OUT CATH URINE  Final   Special Requests   Final    NONE Performed at [facility], [address] [place], [place] [address]    Culture >=100,000 COLONIES/mL ENTEROBACTER CLOACAE (A)  Final   Report Status 08/13/2023 FINAL  Final   Organism ID, Bacteria ENTEROBACTER CLOACAE (A)  Final      Susceptibility   Enterobacter cloacae - MIC*    CEFEPIME <=0.12 SENSITIVE Sensitive     CIPROFLOXACIN <=0.25 SENSITIVE Sensitive     GENTAMICIN <=1 SENSITIVE Sensitive     IMIPENEM 1 SENSITIVE Sensitive     NITROFURANTOIN 32 SENSITIVE Sensitive     TRIMETH/SULFA <=20 SENSITIVE Sensitive     PIP/TAZO <=4 SENSITIVE Sensitive     * >=100,000 COLONIES/mL ENTEROBACTER CLOACAE  Culture, Respiratory w Gram Stain     Status: None   Collection Time: 08/11/23  8:07 AM   Specimen: Tracheal Aspirate; Respiratory  Result Value Ref Range Status   Specimen Description TRACHEAL ASPIRATE  Final   Special Requests NONE  Final   Gram Stain   Final    FEW WBC PRESENT, PREDOMINANTLY PMN ABUNDANT GRAM POSITIVE COCCI IN CLUSTERS FEW GRAM POSITIVE COCCI IN CHAINS FEW GRAM POSITIVE RODS Performed at [facility], [address] [place], [place] [address]    Culture FEW PSEUDOMONAS AERUGINOSA  Final   Report Status 08/13/2023 FINAL  Final   Organism ID, Bacteria PSEUDOMONAS AERUGINOSA  Final      Susceptibility   Pseudomonas aeruginosa - MIC*    CEFTAZIDIME 4 SENSITIVE Sensitive     CIPROFLOXACIN <=0.25 SENSITIVE Sensitive     GENTAMICIN 2  SENSITIVE Sensitive     IMIPENEM 1 SENSITIVE Sensitive     PIP/TAZO 8 SENSITIVE Sensitive     CEFEPIME 2 SENSITIVE Sensitive     * FEW PSEUDOMONAS AERUGINOSA  Respiratory (~20 pathogens) panel by PCR     Status: None   Collection Time: 08/11/23  6:50 PM  Result Value Ref Range Status   Adenovirus NOT DETECTED NOT DETECTED Final   Coronavirus 229E NOT DETECTED NOT DETECTED Final    Comment: (NOTE) The Coronavirus on the Respiratory Panel, DOES NOT test for the novel  Coronavirus (2019 nCoV)    Coronavirus HKU1 NOT DETECTED NOT DETECTED Final   Coronavirus NL63 NOT DETECTED NOT DETECTED Final   Coronavirus OC43 NOT DETECTED NOT DETECTED Final   Metapneumovirus NOT DETECTED NOT DETECTED Final   Rhinovirus / Enterovirus NOT DETECTED NOT DETECTED Final   Influenza A NOT DETECTED NOT DETECTED Final   Influenza B NOT DETECTED NOT DETECTED Final   Parainfluenza Virus 1 NOT DETECTED NOT DETECTED Final   Parainfluenza Virus 2 NOT DETECTED NOT DETECTED Final   Parainfluenza Virus 3 NOT DETECTED NOT DETECTED Final   Parainfluenza Virus 4 NOT DETECTED NOT DETECTED Final   Respiratory Syncytial Virus NOT DETECTED NOT DETECTED Final   Bordetella pertussis NOT DETECTED NOT DETECTED Final   Bordetella Parapertussis NOT DETECTED NOT DETECTED Final   Chlamydophila pneumoniae NOT DETECTED NOT DETECTED Final   Mycoplasma pneumoniae NOT DETECTED NOT DETECTED Final    Comment: Performed at [facility] Lab, [address], [place], [place] [address]  MRSA Next Gen by PCR, Nasal     Status: None   Collection Time: 08/12/23 10:12 AM   Specimen: Nasal Mucosa; Nasal Swab  Result Value Ref Range Status   MRSA by PCR Next Gen NOT DETECTED NOT DETECTED Final    Comment: (NOTE) The GeneXpert MRSA Assay (FDA approved for NASAL specimens only), is one component of a comprehensive MRSA colonization surveillance program. It is not intended to diagnose MRSA infection nor to guide or monitor treatment for MRSA  infections. Test performance is not FDA approved in patients less than 29 years old. Performed at [facility], [address] [place], [place] [address]          Radiology Studies: No results found.      Scheduled Meds:  albuterol  2.5 mg Nebulization TID   arformoterol  15 mcg Nebulization BID   budesonide (PULMICORT) nebulizer solution  0.5 mg Nebulization BID   Chlorhexidine Gluconate Cloth  6 each Topical Q0600   Chlorhexidine Gluconate Cloth  6 each Topical Q0600   docusate  100 mg Oral BID   doxercalciferol  2 mcg Intravenous Q T,Th,Sa-HD   guaiFENesin  600 mg Oral BID   heparin  5,000 Units Subcutaneous Q8H   insulin aspart  0-9 Units Subcutaneous TID WC   metoprolol tartrate  25 mg Oral BID   multivitamin  1 tablet Oral QHS   nicotine  14 mg Transdermal Daily   mouth rinse  15 mL Mouth Rinse 4 times per day   polyethylene glycol  17 g Oral Daily   revefenacin  175 mcg Nebulization Daily   sevelamer carbonate  1.6 g Oral TID WC   Continuous Infusions:     LOS: 7 days    Time spent: 35 minutes.     Alba Cory, MD [facility]   If 7PM-7AM, please contact night-coverage [email or web address]  08/17/2023, 2:48 PM

## 2023-08-17 NOTE — Progress Notes (Signed)
Patient in dialysis unit most of the night. This RT asked pt if he wished to go on his Cpap at 0400 and pt states that if he gets sleepy and wants to wear it, he will let someone know.  NAD noted, VSS

## 2023-08-18 DIAGNOSIS — J9622 Acute and chronic respiratory failure with hypercapnia: Secondary | ICD-10-CM | POA: Diagnosis not present

## 2023-08-18 LAB — CBC
HCT: 37.9 % — ABNORMAL LOW (ref 39.0–52.0)
Hemoglobin: 12.2 g/dL — ABNORMAL LOW (ref 13.0–17.0)
MCH: 31 pg (ref 26.0–34.0)
MCHC: 32.2 g/dL (ref 30.0–36.0)
MCV: 96.2 fL (ref 80.0–100.0)
Platelets: 323 10*3/uL (ref 150–400)
RBC: 3.94 MIL/uL — ABNORMAL LOW (ref 4.22–5.81)
RDW: 14.2 % (ref 11.5–15.5)
WBC: 13.1 10*3/uL — ABNORMAL HIGH (ref 4.0–10.5)
nRBC: 0 % (ref 0.0–0.2)

## 2023-08-18 LAB — BASIC METABOLIC PANEL
Anion gap: 17 — ABNORMAL HIGH (ref 5–15)
BUN: 82 mg/dL — ABNORMAL HIGH (ref 8–23)
CO2: 25 mmol/L (ref 22–32)
Calcium: 8.9 mg/dL (ref 8.9–10.3)
Chloride: 93 mmol/L — ABNORMAL LOW (ref 98–111)
Creatinine, Ser: 5.79 mg/dL — ABNORMAL HIGH (ref 0.61–1.24)
GFR, Estimated: 9 mL/min — ABNORMAL LOW (ref >60)
Glucose, Bld: 99 mg/dL (ref 70–99)
Potassium: 4.8 mmol/L (ref 3.5–5.1)
Sodium: 135 mmol/L (ref 135–145)

## 2023-08-18 LAB — GLUCOSE, CAPILLARY
Glucose-Capillary: 108 mg/dL — ABNORMAL HIGH (ref 70–99)
Glucose-Capillary: 122 mg/dL — ABNORMAL HIGH (ref 70–99)
Glucose-Capillary: 177 mg/dL — ABNORMAL HIGH (ref 70–99)
Glucose-Capillary: 120 mg/dL — ABNORMAL HIGH (ref 70–99)

## 2023-08-18 MED ORDER — LEVOFLOXACIN 250 MG PO TABS
250.0000 mg | ORAL_TABLET | Freq: Every day | ORAL | Status: DC
  Administered 2023-08-18: 250 mg via ORAL
  Filled 2023-08-18 (×2): qty 1

## 2023-08-18 MED ORDER — CHLORHEXIDINE GLUCONATE CLOTH 2 % EX PADS
6.0000 | MEDICATED_PAD | Freq: Every day | CUTANEOUS | Status: DC
  Administered 2023-08-18: 6 via TOPICAL

## 2023-08-18 MED ORDER — CHLORHEXIDINE GLUCONATE CLOTH 2 % EX PADS
6.0000 | MEDICATED_PAD | Freq: Every day | CUTANEOUS | Status: DC
  Administered 2023-08-19: 6 via TOPICAL

## 2023-08-18 MED ORDER — GUAIFENESIN 100 MG/5ML PO LIQD
20.0000 mL | Freq: Two times a day (BID) | ORAL | Status: DC
  Administered 2023-08-18 (×2): 20 mL via ORAL
  Filled 2023-08-18 (×2): qty 20

## 2023-08-18 NOTE — Plan of Care (Signed)
  Problem: Clinical Measurements: Goal: Ability to maintain clinical measurements within normal limits will improve Outcome: Progressing   Problem: Nutrition: Goal: Adequate nutrition will be maintained Outcome: Progressing   Problem: Pain Managment: Goal: General experience of comfort will improve Outcome: Progressing

## 2023-08-18 NOTE — Progress Notes (Signed)
Inpatient Rehab Admissions Coordinator:    CIR following, but I currently have no bed to offer. Note TOC working on placement at [facility] or SNF.   Megan Salon, MS, CCC-SLP Rehab Admissions Coordinator  [phone] (celll) [phone] (office)

## 2023-08-18 NOTE — Progress Notes (Signed)
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  He completed hemodialysis the day prior to admission.  He was noted to be coughing more.  He continues to smoke 1 pack/day.  Patient failed a trial on BiPAP and he was intubated on 9/29.  He was admitted with acute metabolic encephalopathy secondary to acidosis, shock, sepsis secondary to urinary tract infection versus pneumonia.  He required IV pressors and admitted by CCM.  He was extubated 10/01. Off pressors.  Care transferred to triad 10/02.     Assessment & Plan:   Active Problems:   Hypoxia   Acute on chronic respiratory failure with hypercapnia (HCC)   Malnutrition of moderate degree  1-Acute metabolic encephalopathy, in the setting of acidosis, infection, hypoxia, Hypercapnia.  -CT Head: No acute intracranial abnormality. -Patient became more confuse 10/03. Suspect delirium. Change  cefepime to Cipro. -10/04: patient was Lethargic. Placed on BIPAP. Improved.  -improved.   Severe Sepsis,  shock required IV pressors -In the setting of pneumonia and UTI -He has been off of pressors -Chest x-ray on 9/29: Showed right lower lobe atelectasis or pneumonia. -Respiratory  culture growing Pseudomonas -Urine culture grew: Enterbacter Cloacae.  -Completed 7 days antibiotics   Leukocytosis; suspect related to aspiration event over weekend. Will resume Levaquin for 3 days.   Acute on chronic hypoxic respiratory failure with hypercapnia OSA not on CPAP -Patient require intubation 9/29, extubated 10/01. -Continue with bronchodilators -Transition to oral prednisone.  -Respiratory failure could be in the setting of acute pulmonary  edema concern he may under dialyzed due to change in hemodialysis regimen -Concern for pneumonia, sputum culture growing Pseudomonas -Respiratory toilet, flutter valve, incentive spirometry -Repeated chest x ray 10/4; Diffuse bilateral interstitial pulmonary opacity, consistent with edema or atypical/viral infection. Small layering left pleural effusion. -BNP elevated. Placed on BIPAP , had exta HD 2.7 L removed.  -Fluid restriction.  -he is on 2 L oxygen, he uses 2 L at home.  Stable on 2 L.   ESRD on hemodialysis Hyponatremia: Correction with HD.  Hyperkalemia: Correction with HD.  Anion gap metabolic acidosis   Mild Elevation of troponins In the setting of sepsis.  No chest pain.  Hypertension; on metoprolol  Anemia of chronic kidney disease Monitor Hb.   Deconditioning Urine Retention; Continue self catheterization.   Dysphagia;  Evaluated by speech. On dysphagia 1 diet  Consult speech    Nutrition Problem: Moderate Malnutrition Etiology: chronic illness (ESRD/HD, COPD)    Signs/Symptoms: moderate fat depletion, severe muscle depletion    Interventions: Tube feeding, Refer to RD note for recommendations  Estimated body mass index is 21.56 kg/m as calculated from the following:   Height as of this encounter: 5\' 10"  (1.778 m).   Weight as of this encounter: 68.2 kg.   DVT prophylaxis: Heparin  Code Status: Full code Family Communication: Wife on 10/06 Disposition Plan:  Status is: Inpatient Remains inpatient appropriate because: management of resp failure. No medical Stable to transfer to Rehab today.     Consultants:  CCM Nephrology   Procedures:  Mechanical intubation.   Antimicrobials:    Subjective: He is sleepy, wake up answer questions. He ate breakfast. He  is breathing better.  Objective: Vitals:   08/18/23 0410 08/18/23 0741 08/18/23 0835 08/18/23 1155  BP: 138/66  126/68 (!) 131/47  Pulse: 84 85  81  Resp: 19 (!) 22 (!) 22 20   Temp: 97.6 F (36.4 C)  97.6 F (36.4 C) 98.1 F (36.7 C)  TempSrc: Oral  Oral Oral  SpO2: 95% 97%    Weight:      Height:       No intake or output data in the 24 hours ending 08/18/23 1248  Filed Weights   08/15/23 2114 08/16/23 0141 08/17/23 0437  Weight: 72.3 kg 69.5 kg 68.2 kg    Examination:  General exam: NAD Respiratory system: CTA Cardiovascular system: S1, S 2 RRR Gastrointestinal system: BS present, soft, nt Central nervous system: alert Extremities: no edema    Data Reviewed: I have personally reviewed following labs and imaging studies  CBC: Recent Labs  Lab 08/12/23 0750 08/13/23 0803 08/14/23 0425 08/15/23 0948 08/18/23 0648  WBC 10.6* 11.3* 10.4 8.9 13.1*  HGB 10.0* 10.3* 11.0* 11.2* 12.2*  HCT 30.8* 32.3* 33.7* 35.4* 37.9*  MCV 96.9 96.4 98.0 97.0 96.2  PLT 256 267 263 291 323   Basic Metabolic Panel: Recent Labs  Lab 08/11/23 1739 08/12/23 0750 08/12/23 1651 08/13/23 0803 08/14/23 0425 08/15/23 0948 08/18/23 0648  NA  --  133*  --  132* 133* 134* 135  K  --  5.1  --  5.9* 5.4* 4.7 4.8  CL  --  91*  --  93* 94* 93* 93*  CO2  --  23  --  22 24 27 25   GLUCOSE  --  133*  --  134* 117* 132* 99  BUN  --  50*  --  91* 55* 63* 82*  CREATININE  --  5.13*  --  7.02* 4.39* 4.66* 5.79*  CALCIUM  --  8.7*  --  8.5* 8.3* 8.4* 8.9  MG 1.9 2.2 2.3  --   --   --   --   PHOS 5.2* 6.5* 7.4*  --  4.6  --   --    GFR: Estimated Creatinine Clearance: 9.3 mL/min (A) (by C-G formula based on SCr of 5.79 mg/dL (H)). Liver Function Tests: Recent Labs  Lab 08/14/23 0425  ALBUMIN 3.0*   No results for input(s): "LIPASE", "AMYLASE" in the last 168 hours. No results for input(s): "AMMONIA" in the last 168 hours. Coagulation Profile: No results for input(s): "INR", "PROTIME" in the last 168 hours. Cardiac Enzymes: No results for input(s): "CKTOTAL", "CKMB", "CKMBINDEX", "TROPONINI" in the last 168 hours. BNP (last 3 results) No results for input(s):  "PROBNP" in the last 8760 hours. HbA1C: No results for input(s): "HGBA1C" in the last 72 hours.  CBG: Recent Labs  Lab 08/17/23 1203 08/17/23 1609 08/17/23 2123 08/18/23 0837 08/18/23 1157  GLUCAP 158* 219* 114* 108* 122*   Lipid Profile: No results for input(s): "CHOL", "HDL", "LDLCALC", "TRIG", "CHOLHDL", "LDLDIRECT" in the last 72 hours. Thyroid Function Tests: No results for input(s): "TSH", "T4TOTAL", "FREET4", "T3FREE", "THYROIDAB" in the last 72 hours. Anemia Panel: No results for input(s): "VITAMINB12", "FOLATE", "FERRITIN", "TIBC", "IRON", "RETICCTPCT" in the last 72 hours. Sepsis Labs: Recent Labs  Lab 08/11/23 1519  LATICACIDVEN 1.2    Recent Results (from the past 240 hour(s))  SARS Coronavirus 2 by RT PCR (hospital order, performed in [facility] hospital lab) *cepheid single result test* Anterior Nasal Swab     Status: None   Collection Time: 08/10/23  4:08  PM   Specimen: Anterior Nasal Swab  Result Value Ref Range Status   SARS Coronavirus 2 by RT PCR NEGATIVE NEGATIVE Final    Comment: Performed at [facility], [address] [place], [place] [address]  Blood culture (routine x 2)     Status: None   Collection Time: 08/10/23  5:18 PM   Specimen: BLOOD RIGHT ARM  Result Value Ref Range Status   Specimen Description BLOOD RIGHT ARM  Final   Special Requests   Final    BOTTLES DRAWN AEROBIC AND ANAEROBIC Blood Culture adequate volume   Culture   Final    NO GROWTH 5 DAYS Performed at [facility], [address], [place], [place] [address]    Report Status 08/15/2023 FINAL  Final  Blood culture (routine x 2)     Status: None   Collection Time: 08/10/23  5:47 PM   Specimen: BLOOD RIGHT ARM  Result Value Ref Range Status   Specimen Description BLOOD RIGHT ARM  Final   Special Requests   Final    BOTTLES DRAWN AEROBIC AND ANAEROBIC Blood Culture adequate volume   Culture   Final    NO GROWTH 5 DAYS Performed at [facility],  [address] [place], [place] [address]    Report Status 08/15/2023 FINAL  Final  Urine Culture (for pregnant, neutropenic or urologic patients or patients with an indwelling urinary catheter)     Status: Abnormal   Collection Time: 08/10/23  6:16 PM   Specimen: In/Out Cath Urine  Result Value Ref Range Status   Specimen Description IN/OUT CATH URINE  Final   Special Requests   Final    NONE Performed at [facility], [address], [place], [place] [address]    Culture >=100,000 COLONIES/mL ENTEROBACTER CLOACAE (A)  Final   Report Status 08/13/2023 FINAL  Final   Organism ID, Bacteria ENTEROBACTER CLOACAE (A)  Final      Susceptibility   Enterobacter cloacae - MIC*    CEFEPIME <=0.12 SENSITIVE Sensitive     CIPROFLOXACIN <=0.25 SENSITIVE Sensitive     GENTAMICIN <=1 SENSITIVE Sensitive     IMIPENEM 1 SENSITIVE Sensitive     NITROFURANTOIN 32 SENSITIVE Sensitive     TRIMETH/SULFA <=20 SENSITIVE Sensitive     PIP/TAZO <=4 SENSITIVE Sensitive     * >=100,000 COLONIES/mL ENTEROBACTER CLOACAE  Culture, Respiratory w Gram Stain     Status: None   Collection Time: 08/11/23  8:07 AM   Specimen: Tracheal Aspirate; Respiratory  Result Value Ref Range Status   Specimen Description TRACHEAL ASPIRATE  Final   Special Requests NONE  Final   Gram Stain   Final    FEW WBC PRESENT, PREDOMINANTLY PMN ABUNDANT GRAM POSITIVE COCCI IN CLUSTERS FEW GRAM POSITIVE COCCI IN CHAINS FEW GRAM POSITIVE RODS Performed at [facility], [address], [place], [place] [address]    Culture FEW PSEUDOMONAS AERUGINOSA  Final   Report Status 08/13/2023 FINAL  Final   Organism ID, Bacteria PSEUDOMONAS AERUGINOSA  Final      Susceptibility   Pseudomonas aeruginosa - MIC*    CEFTAZIDIME 4 SENSITIVE Sensitive     CIPROFLOXACIN <=0.25 SENSITIVE Sensitive     GENTAMICIN 2 SENSITIVE Sensitive     IMIPENEM 1 SENSITIVE Sensitive     PIP/TAZO 8 SENSITIVE Sensitive     CEFEPIME 2 SENSITIVE Sensitive      * FEW PSEUDOMONAS AERUGINOSA  Respiratory (~20  pathogens) panel by PCR     Status: None   Collection Time: 08/11/23  6:50 PM  Result Value Ref Range Status   Adenovirus NOT DETECTED NOT DETECTED Final   Coronavirus 229E NOT DETECTED NOT DETECTED Final    Comment: (NOTE) The Coronavirus on the Respiratory Panel, DOES NOT test for the novel  Coronavirus (2019 nCoV)    Coronavirus HKU1 NOT DETECTED NOT DETECTED Final   Coronavirus NL63 NOT DETECTED NOT DETECTED Final   Coronavirus OC43 NOT DETECTED NOT DETECTED Final   Metapneumovirus NOT DETECTED NOT DETECTED Final   Rhinovirus / Enterovirus NOT DETECTED NOT DETECTED Final   Influenza A NOT DETECTED NOT DETECTED Final   Influenza B NOT DETECTED NOT DETECTED Final   Parainfluenza Virus 1 NOT DETECTED NOT DETECTED Final   Parainfluenza Virus 2 NOT DETECTED NOT DETECTED Final   Parainfluenza Virus 3 NOT DETECTED NOT DETECTED Final   Parainfluenza Virus 4 NOT DETECTED NOT DETECTED Final   Respiratory Syncytial Virus NOT DETECTED NOT DETECTED Final   Bordetella pertussis NOT DETECTED NOT DETECTED Final   Bordetella Parapertussis NOT DETECTED NOT DETECTED Final   Chlamydophila pneumoniae NOT DETECTED NOT DETECTED Final   Mycoplasma pneumoniae NOT DETECTED NOT DETECTED Final    Comment: Performed at [facility], [address], [place], [place] [address]  MRSA Next Gen by PCR, Nasal     Status: None   Collection Time: 08/12/23 10:12 AM   Specimen: Nasal Mucosa; Nasal Swab  Result Value Ref Range Status   MRSA by PCR Next Gen NOT DETECTED NOT DETECTED Final    Comment: (NOTE) The GeneXpert MRSA Assay (FDA approved for NASAL specimens only), is one component of a comprehensive MRSA colonization surveillance program. It is not intended to diagnose MRSA infection nor to guide or monitor treatment for MRSA infections. Test performance is not FDA approved in patients less than 58 years old. Performed at [facility], [address] [place], [place] [address]          Radiology Studies: No results found.      Scheduled Meds:  albuterol  2.5 mg Nebulization TID   arformoterol  15 mcg Nebulization BID   budesonide (PULMICORT) nebulizer solution  0.5 mg Nebulization BID   Chlorhexidine Gluconate Cloth  6 each Topical Daily   docusate  100 mg Oral BID   doxercalciferol  2 mcg Intravenous Q T,Th,Sa-HD   guaiFENesin  20 mL Oral BID   heparin  5,000 Units Subcutaneous Q8H   insulin aspart  0-9 Units Subcutaneous TID WC   levofloxacin  250 mg Oral Daily   metoprolol tartrate  25 mg Oral BID   multivitamin  1 tablet Oral QHS   nicotine  14 mg Transdermal Daily   mouth rinse  15 mL Mouth Rinse 4 times per day   polyethylene glycol  17 g Oral Daily   revefenacin  175 mcg Nebulization Daily   sevelamer carbonate  1.6 g Oral TID WC   Continuous Infusions:     LOS: 8 days    Time spent: 35 minutes.     Alba Cory, MD [facility]   If 7PM-7AM, please contact night-coverage [email or web address]  08/18/2023, 12:48 PM

## 2023-08-18 NOTE — TOC Progression Note (Addendum)
Transition of Care [facility]) - Progression Note    Patient Details  Name: Jeremy [name] MRN: [id number] Date of Birth: September 15, 1941  Transition of Care [facility]) CM/SW Contact  Delilah Shan, LCSWA Phone Number: 08/18/2023, 12:01 PM  Clinical Narrative:     CSW informed by Vernona Rieger with inpatient rehab that patients spouse would like for patient to go to [facility] when medically ready. CSW awaiting call back from Star with [facility] to confirm SNF bed for patient. CSW spoke with patients spouse who confirmed that she would like patient to go to [facility] when medically ready. MD informed CSW patient will need Cpap at SNF. Patients spouse informed CSW patient will need cpap ordered through facility.   Star with [facility] confirmed she can accept patient when medically ready. CSW requested Cpap for patient. Star is going to check on Cpap and give CSW call back.  Facility confirmed they can accept patient tomorrow for SNF. Facility is going to order Cpap and informed CSW was told it will arrive tomorrow. Facility requested orders for Cpap. CSW informed MD. CSW requested patient be put on 1st shift tomorrow  for HD to tracy renal navigator.  Expected Discharge Plan: IP Rehab Facility Barriers to Discharge: Continued Medical Work up  Expected Discharge Plan and Services   Discharge Planning Services: CM Consult   Living arrangements for the past 2 months: Single Family Home                                       Social Determinants of Health (SDOH) Interventions SDOH Screenings   Food Insecurity: No Food Insecurity (08/14/2023)  Housing: Low Risk  (08/14/2023)  Transportation Needs: No Transportation Needs (08/14/2023)  Utilities: Not At Risk (08/14/2023)  Depression (PHQ2-9): Low Risk  (05/19/2023)  Tobacco Use: High Risk (08/14/2023)    Readmission Risk Interventions     No data to display

## 2023-08-18 NOTE — Progress Notes (Signed)
Physical Therapy Treatment Patient Details Name: GOULD SNIDE MRN: [id number] DOB: 05/16/1941 Today's Date: 08/18/2023   History of Present Illness 82 y.o. male presents to [facility] hospital on 08/10/2023 with somnolence. Pt found to be hypoxic. Concern for possible sepsis 2/2 UTI. Pt required intubation on 9/29, extubated on 10/1. PMH includes chronic respiratory failure, ESRD, tobacco abuse, OSA, HTN, HLD.    PT Comments  Pt in bed upon arrival and agreeable to PT session. Pt was fatigued in today's session from previous mobility and therapy sessions. Pt was agreeable to performing exercises in supine. Worked on overall strengthening in today's session with cues for proper muscle activation. Pt is progressing well towards goals. Acute PT to follow.      If plan is discharge home, recommend the following: A lot of help with walking and/or transfers;A lot of help with bathing/dressing/bathroom;Assistance with cooking/housework;Assist for transportation;Help with stairs or ramp for entrance   Can travel by private vehicle     Yes        Precautions / Restrictions Precautions Precautions: Fall;Other (comment) Precaution Comments: monitor O2 - does not wear at baseline Restrictions Weight Bearing Restrictions: No     Mobility  Bed Mobility Overal bed mobility: Needs Assistance    General bed mobility comments: Pt able to scoot towards HOB in supine with cues    Transfers  General transfer comment: declined transfers, pt recently had OT and mobility and was feeling fatigued      Balance  Pt remained in supine in today's session       Cognition Arousal: Alert Behavior During Therapy: Impulsive, WFL for tasks assessed/performed Overall Cognitive Status: Impaired/Different from baseline    General Comments: Cues for safety and direction throughout        Exercises General Exercises - Upper Extremity Shoulder Flexion: AROM, Both, 10 reps, Supine Elbow Flexion: AROM, Supine, 10  reps, Both General Exercises - Lower Extremity Heel Slides: AROM, 5 reps, 10 reps, Supine Hip ABduction/ADduction: AROM, Both, 10 reps, Supine Straight Leg Raises: AROM, Supine, Both, 10 reps        Pertinent Vitals/Pain Pain Assessment Pain Assessment: No/denies pain     PT Goals (current goals can now be found in the care plan section) Progress towards PT goals: Progressing toward goals    Frequency    Min 1X/week          AM-PAC PT "6 Clicks" Mobility   Outcome Measure  Help needed turning from your back to your side while in a flat bed without using bedrails?: A Little Help needed moving from lying on your back to sitting on the side of a flat bed without using bedrails?: A Little Help needed moving to and from a bed to a chair (including a wheelchair)?: A Little Help needed standing up from a chair using your arms (e.g., wheelchair or bedside chair)?: A Little Help needed to walk in hospital room?: A Little Help needed climbing 3-5 steps with a railing? : A Lot 6 Click Score: 17    End of Session Equipment Utilized During Treatment: Oxygen Activity Tolerance: Patient limited by fatigue Patient left: in bed;with call bell/phone within reach;with family/visitor present;with bed alarm set Nurse Communication: Mobility status PT Visit Diagnosis: Other abnormalities of gait and mobility (R26.89);Muscle weakness (generalized) (M62.81)     Time: 4332-9518 PT Time Calculation (min) (ACUTE ONLY): 14 min  Charges:    $Therapeutic Exercise: 8-22 mins PT General Charges $$ ACUTE PT VISIT: 1 Visit  [name], PT, DPT Secure Chat Preferred  Rehab Office [phone]   [name] 08/18/2023, 2:26 PM

## 2023-08-18 NOTE — Progress Notes (Signed)
Mobility Specialist Progress Note:    08/18/23 1256  Mobility  Activity Ambulated with assistance in hallway  Level of Assistance Minimal assist, patient does 75% or more  Assistive Device Front wheel walker  Distance Ambulated (ft) 65 ft  Range of Motion/Exercises Active;All extremities  Activity Response Tolerated well  Mobility Referral Yes  $Mobility charge 1 Mobility  Mobility Specialist Start Time (ACUTE ONLY) 1235  Mobility Specialist Stop Time (ACUTE ONLY) 1245  Mobility Specialist Time Calculation (min) (ACUTE ONLY) 10 min   Pt received in bed, agreeable to mobility session. Ambulated in hallway with RW, MinA required to stand, CGA during ambulation. Tolerated well, asx throughout. Returned pt to room, sitting in chair with alarm on. RN notified, call bell in reach, and all needs met.   Feliciana Rossetti Mobility Specialist Please contact via Special educational needs teacher or  Rehab office at [phone]

## 2023-08-18 NOTE — Progress Notes (Signed)
[facility] Progress Note  Subjective: pt seen in room, getting ready to walk w/ PT  Vitals:   08/18/23 0741 08/18/23 0835 08/18/23 1155 08/18/23 1604  BP:  126/68 (!) 131/47 (!) 128/59  Pulse: 85  81 81  Resp: (!) 22 (!) 22 20 (!) 21  Temp:  97.6 F (36.4 C) 98.1 F (36.7 C) 97.7 F (36.5 C)  TempSrc:  Oral Oral Oral  SpO2: 97%     Weight:      Height:        Exam: Alert, on 2L [place] Dry mouth No jvd Chest - CTA bilat Cor reg no mrg Abd soft, ntnd  Ext no edema UE/ LE"s   LUA AVF+ bruit     Renal related home meds: - metoprolol xl 50 every day - renvela 2400mg  ac tid    OP HD: SW TTS  3h   73.3kg   400/600  2/2 bath  Heparin 2500  LUA AVF Hectorol 2  Last Mircera 7/2    9/29 CXR - R lower lung atx vs pna, no edema   Assessment/Plan: Acute on chronic resp failure - prob pna and volume components. Off the vent tuesday. Hx of COPD. Pt has poor respiratory reserve and doesn't tolerate volume excess very well here. Getting steroid taper here and po abx w/ cipro.   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 Left at his EDW last Saturday at op unit. Had 5 HD sessions here last week. Next HD Tuesday.  Sepsis / UTI/ PNA- UCx grew enterobacter and sputum cx grew pseudomonas. SP IV cefepime --> now switched to po cipro.   Volume - wts were down to 68kg (5kg under dry wt). BP dropped into 70s w/ yesterday's HD, 2.2 L off. Probably this is lowest dry wt we can get.  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  HTN - getting metoprolol bid  Urinary retention - per pmd Anemia of esrd - Hb here 10- 12 range, no esa needs.  Chronic resp failure - due to OSA + COPD H/o bladder cancer - recent cystoscopy in February 2023 without evidence of recurrent disease.    [name] MD  [facility] 08/18/2023, 4:45 PM  Recent Labs  Lab 08/12/23 1651 08/13/23 0803 08/14/23 0425 08/15/23 0948 08/18/23 0648  HGB  --    < > 11.0* 11.2* 12.2*  ALBUMIN  --   --  3.0*  --    --   CALCIUM  --    < > 8.3* 8.4* 8.9  PHOS 7.4*  --  4.6  --   --   CREATININE  --    < > 4.39* 4.66* 5.79*  K  --    < > 5.4* 4.7 4.8   < > = values in this interval not displayed.   No results for input(s): "IRON", "TIBC", "FERRITIN" in the last 168 hours. Inpatient medications:  albuterol  2.5 mg Nebulization TID   arformoterol  15 mcg Nebulization BID   budesonide (PULMICORT) nebulizer solution  0.5 mg Nebulization BID   Chlorhexidine Gluconate Cloth  6 each Topical Daily   docusate  100 mg Oral BID   doxercalciferol  2 mcg Intravenous Q T,Th,Sa-HD   guaiFENesin  20 mL Oral BID   heparin  5,000 Units Subcutaneous Q8H   insulin aspart  0-9 Units Subcutaneous TID WC   levofloxacin  250 mg Oral Daily   metoprolol tartrate  25 mg Oral BID  multivitamin  1 tablet Oral QHS   nicotine  14 mg Transdermal Daily   mouth rinse  15 mL Mouth Rinse 4 times per day   polyethylene glycol  17 g Oral Daily   revefenacin  175 mcg Nebulization Daily   sevelamer carbonate  1.6 g Oral TID WC     hydrALAZINE, labetalol, mouth rinse

## 2023-08-18 NOTE — Progress Notes (Signed)
   08/18/23 1417  Spiritual Encounters  Type of Visit Initial  Care provided to: Pt and family  Referral source Patient request  Reason for visit Advance directives  OnCall Visit No   Chaplain responding to Spiritual Consult seeking notary for ACD.  Chaplain examined the paperwork and determined with was ready for notarization.  Chaplain initially had trouble obtaining volunteers for witnesses before lunch and was advised to try again after lunch.  After lunch, volunteers were available and notarization was completed. Chaplain scanned and uploaded ACD paperwork and returned original to Pt, along with copies for the health care proxies.  Chaplain services remain available by Spiritual Consult or for emergent cases, paging [phone]  Chaplain Raelene Bott, MDiv [email or web address] [phone]

## 2023-08-18 NOTE — Progress Notes (Signed)
Contacted by CSW that pt may d/c to snf tomorrow and requested 1st shift HD tomorrow if possible. Contacted inpt HD unit to request 1st shift tomorrow so pt can be d/c to snf in a timely manner. Will assist as needed.   Olivia Canter Renal Navigator [phone]

## 2023-08-18 NOTE — TOC Progression Note (Addendum)
Transition of Care [facility]) - Progression Note    Patient Details  Name: Jeremy [name] MRN: [id number] Date of Birth: 1940-12-18  Transition of Care [facility]) CM/SW Contact  Graves-Bigelow, Lamar Laundry, RN Phone Number: 08/18/2023, 11:33 AM  Clinical Narrative: Case Manager spoke with Velna Hatchet with [facility] and she will need updated PT notes to see if patient is a candidate. PT/OT to see the patient today and document oxygen demands. Case Manager will continue to follow for transition of care needs as the patient progresses.   1145 08-18-23 Case Manager just received a message from Rehab Admissions Coordinator with [facility] and states the wife would rather go to [facility] for SNF. CSW will continue to follow for transition of care needs.   Expected Discharge Plan: IP Rehab Facility Barriers to Discharge: Continued Medical Work up  Expected Discharge Plan and Services   Discharge Planning Services: CM Consult   Living arrangements for the past 2 months: Single Family Home  Social Determinants of Health (SDOH) Interventions SDOH Screenings   Food Insecurity: No Food Insecurity (08/14/2023)  Housing: Low Risk  (08/14/2023)  Transportation Needs: No Transportation Needs (08/14/2023)  Utilities: Not At Risk (08/14/2023)  Depression (PHQ2-9): Low Risk  (05/19/2023)  Tobacco Use: High Risk (08/14/2023)    Readmission Risk Interventions     No data to display

## 2023-08-18 NOTE — Progress Notes (Signed)
Occupational Therapy Treatment Patient Details Name: Jeremy [name] MRN: [id number] DOB: 1941-04-28 Today's Date: 08/18/2023   History of present illness 82 y.o. male presents to [facility] hospital on 08/10/2023 with somnolence. Pt found to be hypoxic. Concern for possible sepsis 2/2 UTI. Pt required intubation on 9/29, extubated on 10/1. PMH includes chronic respiratory failure, ESRD, tobacco abuse, OSA, HTN, HLD.   OT comments  Pt making progress with functional goals. Pt's O2 SATs at rest 96%, desaturating to 81% with activity. Pt instructed on deep, pursed lip breathing and activity pacing with rest breaks to recover to 95-97%. Pt's wife asked many questions regarding post acute SNF rehab and states that they are planning for pt to d/c to [facility] once pt is medically ready. Educated pt and his wife on SNF rehab. OT will continue to follow acutely to maximize level of function and safety      If plan is discharge home, recommend the following:  A little help with walking and/or transfers;A little help with bathing/dressing/bathroom;Assistance with cooking/housework;Direct supervision/assist for medications management;Direct supervision/assist for financial management;Help with stairs or ramp for entrance   Equipment Recommendations  Other (comment) (TBD at next venue of care)    Recommendations for Other Services      Precautions / Restrictions Precautions Precautions: Fall;Other (comment) Precaution Comments: monitor O2 - does not wear at baseline Restrictions Weight Bearing Restrictions: No       Mobility Bed Mobility               General bed mobility comments: pt in chair upon arrival. Assisted back to bed min A with LE    Transfers Overall transfer level: Needs assistance Equipment used: Rolling walker (2 wheels) Transfers: Sit to/from Stand Sit to Stand: Contact guard assist           General transfer comment: cues for safe hand placement and CGA for  safety     Balance Overall balance assessment: Needs assistance Sitting-balance support: No upper extremity supported, Feet supported Sitting balance-Leahy Scale: Fair     Standing balance support: Bilateral upper extremity supported, During functional activity Standing balance-Leahy Scale: Fair                             ADL either performed or assessed with clinical judgement   ADL Overall ADL's : Needs assistance/impaired     Grooming: Contact guard assist;Standing;Wash/dry hands                   Toilet Transfer: Contact guard assist;Rolling walker (2 wheels);BSC/3in1   Toileting- Clothing Manipulation and Hygiene: Minimal assistance;Sitting/lateral lean;Sit to/from stand Toileting - Clothing Manipulation Details (indicate cue type and reason): assist for balance in standing and cues for safety, able to perform hygiene with some assist for clothing mgmt     Functional mobility during ADLs: Contact guard assist;Rolling walker (2 wheels);Cueing for safety;Cueing for sequencing      Extremity/Trunk Assessment Upper Extremity Assessment Upper Extremity Assessment: Generalized weakness   Lower Extremity Assessment Lower Extremity Assessment: Defer to PT evaluation   Cervical / Trunk Assessment Cervical / Trunk Assessment: Normal    Vision Baseline Vision/History: 1 Wears glasses Ability to See in Adequate Light: 1 Impaired Patient Visual Report: No change from baseline Vision Assessment?: No apparent visual deficits   Perception     Praxis      Cognition Arousal: Alert Behavior During Therapy: Impulsive, WFL for tasks assessed/performed Overall Cognitive  Status: Impaired/Different from baseline Area of Impairment: Memory, Safety/judgement, Awareness, Problem solving                     Memory: Decreased short-term memory   Safety/Judgement: Decreased awareness of safety, Decreased awareness of deficits   Problem Solving: Requires  verbal cues General Comments: Cues for safety and direction throughout        Exercises      Shoulder Instructions       General Comments      Pertinent Vitals/ Pain       Pain Assessment Pain Assessment: No/denies pain Pain Score: 0-No pain  Home Living                                          Prior Functioning/Environment              Frequency  Min 1X/week        Progress Toward Goals  OT Goals(current goals can now be found in the care plan section)  Progress towards OT goals: Progressing toward goals     Plan      Co-evaluation                 AM-PAC OT "6 Clicks" Daily Activity     Outcome Measure   Help from another person eating meals?: None Help from another person taking care of personal grooming?: A Little Help from another person toileting, which includes using toliet, bedpan, or urinal?: A Little Help from another person bathing (including washing, rinsing, drying)?: A Little Help from another person to put on and taking off regular upper body clothing?: A Little Help from another person to put on and taking off regular lower body clothing?: A Little 6 Click Score: 19    End of Session Equipment Utilized During Treatment: Rolling walker (2 wheels);Gait belt;Oxygen  OT Visit Diagnosis: Unsteadiness on feet (R26.81);Other abnormalities of gait and mobility (R26.89);Muscle weakness (generalized) (M62.81)   Activity Tolerance Patient tolerated treatment well   Patient Left in bed;with call bell/phone within reach;with bed alarm set   Nurse Communication          Time: 7062-3762 OT Time Calculation (min): 27 min  Charges: OT General Charges $OT Visit: 1 Visit OT Treatments $Self Care/Home Management : 8-22 mins $Therapeutic Activity: 128-142 mins    Galen Manila 08/18/2023, 1:42 PM

## 2023-08-19 ENCOUNTER — Inpatient Hospital Stay (HOSPITAL_COMMUNITY): Payer: Medicare Other

## 2023-08-19 ENCOUNTER — Emergency Department (HOSPITAL_COMMUNITY): Payer: Medicare Other

## 2023-08-19 ENCOUNTER — Emergency Department (HOSPITAL_COMMUNITY)
Admission: EM | Admit: 2023-08-19 | Discharge: 2023-08-20 | Disposition: A | Discharge status: Rehabilitation Facility | Payer: Medicare Other | Attending: Emergency Medicine | Admitting: Emergency Medicine

## 2023-08-19 ENCOUNTER — Other Ambulatory Visit: Payer: Self-pay

## 2023-08-19 DIAGNOSIS — Z20822 Contact with and (suspected) exposure to covid-19: Secondary | ICD-10-CM | POA: Insufficient documentation

## 2023-08-19 DIAGNOSIS — F17210 Nicotine dependence, cigarettes, uncomplicated: Secondary | ICD-10-CM | POA: Insufficient documentation

## 2023-08-19 DIAGNOSIS — E875 Hyperkalemia: Secondary | ICD-10-CM | POA: Insufficient documentation

## 2023-08-19 DIAGNOSIS — J449 Chronic obstructive pulmonary disease, unspecified: Secondary | ICD-10-CM | POA: Diagnosis not present

## 2023-08-19 DIAGNOSIS — R0602 Shortness of breath: Secondary | ICD-10-CM | POA: Insufficient documentation

## 2023-08-19 DIAGNOSIS — Z79899 Other long term (current) drug therapy: Secondary | ICD-10-CM | POA: Insufficient documentation

## 2023-08-19 DIAGNOSIS — J90 Pleural effusion, not elsewhere classified: Secondary | ICD-10-CM | POA: Diagnosis not present

## 2023-08-19 DIAGNOSIS — J9622 Acute and chronic respiratory failure with hypercapnia: Secondary | ICD-10-CM | POA: Diagnosis not present

## 2023-08-19 DIAGNOSIS — Z992 Dependence on renal dialysis: Secondary | ICD-10-CM | POA: Insufficient documentation

## 2023-08-19 DIAGNOSIS — I700 Atherosclerosis of aorta: Secondary | ICD-10-CM | POA: Diagnosis not present

## 2023-08-19 DIAGNOSIS — N186 End stage renal disease: Secondary | ICD-10-CM | POA: Insufficient documentation

## 2023-08-19 LAB — CBC
HCT: 38.1 % — ABNORMAL LOW (ref 39.0–52.0)
Hemoglobin: 12.5 g/dL — ABNORMAL LOW (ref 13.0–17.0)
MCH: 32.1 pg (ref 26.0–34.0)
MCHC: 32.8 g/dL (ref 30.0–36.0)
MCV: 97.9 fL (ref 80.0–100.0)
Platelets: 322 10*3/uL (ref 150–400)
RBC: 3.89 MIL/uL — ABNORMAL LOW (ref 4.22–5.81)
RDW: 14.3 % (ref 11.5–15.5)
WBC: 14.1 10*3/uL — ABNORMAL HIGH (ref 4.0–10.5)
nRBC: 0 % (ref 0.0–0.2)

## 2023-08-19 LAB — CBC WITH DIFFERENTIAL/PLATELET
Abs Immature Granulocytes: 0.43 10*3/uL — ABNORMAL HIGH (ref 0.00–0.07)
Basophils Absolute: 0 10*3/uL (ref 0.0–0.1)
Basophils Relative: 0 %
Eosinophils Absolute: 0.1 10*3/uL (ref 0.0–0.5)
Eosinophils Relative: 1 %
HCT: 40.3 % (ref 39.0–52.0)
Hemoglobin: 13.3 g/dL (ref 13.0–17.0)
Immature Granulocytes: 4 %
Lymphocytes Relative: 8 %
Lymphs Abs: 1 10*3/uL (ref 0.7–4.0)
MCH: 32.5 pg (ref 26.0–34.0)
MCHC: 33 g/dL (ref 30.0–36.0)
MCV: 98.5 fL (ref 80.0–100.0)
Monocytes Absolute: 1.1 10*3/uL — ABNORMAL HIGH (ref 0.1–1.0)
Monocytes Relative: 9 %
Neutro Abs: 9.5 10*3/uL — ABNORMAL HIGH (ref 1.7–7.7)
Neutrophils Relative %: 78 %
Platelets: 313 10*3/uL (ref 150–400)
RBC: 4.09 MIL/uL — ABNORMAL LOW (ref 4.22–5.81)
RDW: 14.1 % (ref 11.5–15.5)
WBC: 12.1 10*3/uL — ABNORMAL HIGH (ref 4.0–10.5)
nRBC: 0 % (ref 0.0–0.2)

## 2023-08-19 LAB — COMPREHENSIVE METABOLIC PANEL
ALT: 25 U/L (ref 0–44)
AST: 19 U/L (ref 15–41)
Albumin: 3.5 g/dL (ref 3.5–5.0)
Alkaline Phosphatase: 65 U/L (ref 38–126)
Anion gap: 17 — ABNORMAL HIGH (ref 5–15)
BUN: 65 mg/dL — ABNORMAL HIGH (ref 8–23)
CO2: 25 mmol/L (ref 22–32)
Calcium: 9 mg/dL (ref 8.9–10.3)
Chloride: 93 mmol/L — ABNORMAL LOW (ref 98–111)
Creatinine, Ser: 4.76 mg/dL — ABNORMAL HIGH (ref 0.61–1.24)
GFR, Estimated: 11 mL/min — ABNORMAL LOW (ref >60)
Glucose, Bld: 152 mg/dL — ABNORMAL HIGH (ref 70–99)
Potassium: 5.3 mmol/L — ABNORMAL HIGH (ref 3.5–5.1)
Sodium: 135 mmol/L (ref 135–145)
Total Bilirubin: 1.3 mg/dL — ABNORMAL HIGH (ref 0.3–1.2)
Total Protein: 7.7 g/dL (ref 6.5–8.1)

## 2023-08-19 LAB — RENAL FUNCTION PANEL
Albumin: 3.3 g/dL — ABNORMAL LOW (ref 3.5–5.0)
Anion gap: 20 — ABNORMAL HIGH (ref 5–15)
BUN: 117 mg/dL — ABNORMAL HIGH (ref 8–23)
CO2: 23 mmol/L (ref 22–32)
Calcium: 8.4 mg/dL — ABNORMAL LOW (ref 8.9–10.3)
Chloride: 92 mmol/L — ABNORMAL LOW (ref 98–111)
Creatinine, Ser: 7.38 mg/dL — ABNORMAL HIGH (ref 0.61–1.24)
GFR, Estimated: 7 mL/min — ABNORMAL LOW (ref >60)
Glucose, Bld: 101 mg/dL — ABNORMAL HIGH (ref 70–99)
Phosphorus: 9 mg/dL — ABNORMAL HIGH (ref 2.5–4.6)
Potassium: 5.7 mmol/L — ABNORMAL HIGH (ref 3.5–5.1)
Sodium: 135 mmol/L (ref 135–145)

## 2023-08-19 LAB — BRAIN NATRIURETIC PEPTIDE: B Natriuretic Peptide: 319.7 pg/mL — ABNORMAL HIGH (ref 0.0–100.0)

## 2023-08-19 LAB — RESP PANEL BY RT-PCR (RSV, FLU A&B, COVID)  RVPGX2
Influenza A by PCR: NEGATIVE
Influenza B by PCR: NEGATIVE
Resp Syncytial Virus by PCR: NEGATIVE
SARS Coronavirus 2 by RT PCR: NEGATIVE

## 2023-08-19 LAB — GLUCOSE, CAPILLARY: Glucose-Capillary: 110 mg/dL — ABNORMAL HIGH (ref 70–99)

## 2023-08-19 MED ORDER — DOCUSATE SODIUM 50 MG/5ML PO LIQD: 100.0000 mg | Freq: Two times a day (BID) | ORAL | 0 refills | Status: DC

## 2023-08-19 MED ORDER — HEPARIN SODIUM (PORCINE) 1000 UNIT/ML IJ SOLN
INTRAMUSCULAR | Status: AC
  Filled 2023-08-19: qty 4

## 2023-08-19 MED ORDER — BUDESONIDE 0.5 MG/2ML IN SUSP: 0.5000 mg | Freq: Two times a day (BID) | RESPIRATORY_TRACT | 12 refills | Status: DC

## 2023-08-19 MED ORDER — NICOTINE 14 MG/24HR TD PT24: 14.0000 mg | MEDICATED_PATCH | Freq: Every day | TRANSDERMAL | 0 refills | Status: DC

## 2023-08-19 MED ORDER — HEPARIN BOLUS VIA INFUSION
4000.0000 [IU] | Freq: Once | INTRAVENOUS | Status: AC
  Administered 2023-08-19: 4000 [IU] via INTRAVENOUS_CENTRAL

## 2023-08-19 MED ORDER — RENA-VITE PO TABS: 1.0000 | ORAL_TABLET | Freq: Every day | ORAL | 0 refills | Status: DC

## 2023-08-19 MED ORDER — LEVOFLOXACIN 250 MG PO TABS: 250.0000 mg | ORAL_TABLET | Freq: Every day | ORAL | Status: DC

## 2023-08-19 MED ORDER — METOPROLOL TARTRATE 25 MG PO TABS
25.0000 mg | ORAL_TABLET | Freq: Two times a day (BID) | ORAL | Status: DC
  Filled 2023-08-19: qty 1

## 2023-08-19 MED ORDER — GUAIFENESIN 100 MG/5ML PO LIQD: 20.0000 mL | Freq: Two times a day (BID) | ORAL | 0 refills | Status: DC

## 2023-08-19 MED ORDER — METOPROLOL TARTRATE 25 MG PO TABS: 25.0000 mg | ORAL_TABLET | Freq: Two times a day (BID) | ORAL | 0 refills | Status: DC

## 2023-08-19 MED ORDER — REVEFENACIN 175 MCG/3ML IN SOLN: 175.0000 ug | Freq: Every day | RESPIRATORY_TRACT | 0 refills | Status: DC

## 2023-08-19 MED ORDER — ALBUTEROL SULFATE (2.5 MG/3ML) 0.083% IN NEBU: 2.5000 mg | INHALATION_SOLUTION | Freq: Four times a day (QID) | RESPIRATORY_TRACT | Status: DC | PRN

## 2023-08-19 MED ORDER — POLYETHYLENE GLYCOL 3350 17 G PO PACK: 17.0000 g | PACK | Freq: Every day | ORAL | 0 refills | Status: DC

## 2023-08-19 MED ORDER — SODIUM ZIRCONIUM CYCLOSILICATE 5 G PO PACK
5.0000 g | PACK | Freq: Once | ORAL | Status: AC
  Administered 2023-08-20: 5 g via ORAL
  Filled 2023-08-19: qty 1

## 2023-08-19 MED ORDER — BUDESONIDE 0.5 MG/2ML IN SUSP
0.5000 mg | Freq: Two times a day (BID) | RESPIRATORY_TRACT | Status: DC
  Administered 2023-08-19: 0.5 mg via RESPIRATORY_TRACT
  Filled 2023-08-19: qty 2

## 2023-08-19 MED ORDER — ARFORMOTEROL TARTRATE 15 MCG/2ML IN NEBU: 15.0000 ug | INHALATION_SOLUTION | Freq: Two times a day (BID) | RESPIRATORY_TRACT | 0 refills | Status: DC

## 2023-08-19 MED ORDER — LEVOFLOXACIN 250 MG PO TABS: 250.0000 mg | ORAL_TABLET | Freq: Every day | ORAL | 0 refills | Status: AC

## 2023-08-19 NOTE — Progress Notes (Signed)
   08/19/23 1211  Vitals  Temp 97.6 F (36.4 C)  Temp Source Oral  BP (!) 97/58  BP Location Right Arm  BP Method Automatic  Patient Position (if appropriate) Lying  Resp 20  Oxygen Therapy  O2 Device Nasal Cannula  O2 Flow Rate (L/min) 2 L/min  During Treatment Monitoring  HD Safety Checks Performed Yes  Intra-Hemodialysis Comments Tx completed  Post Treatment  Dialyzer Clearance Heavily streaked  Hemodialysis Intake (mL) 0 mL  Liters Processed 66  Fluid Removed (mL) 1700 mL  Tolerated HD Treatment Yes  Post-Hemodialysis Comments Pt goal not met d/t hypotension. Dr. Signe Colt notified.  AVG/AVF Arterial Site Held (minutes) 10 minutes  AVG/AVF Venous Site Held (minutes) 10 minutes  Fistula / Graft Left Upper arm Arteriovenous fistula  Placement Date/Time: 10/16/22 1059   Placed prior to admission: No  Orientation: Left  Access Location: Upper arm  Access Type: Arteriovenous fistula  Site Condition No complications  Fistula / Graft Assessment Present;Thrill;Bruit  Status Deaccessed  Needle Size 15  Drainage Description None

## 2023-08-19 NOTE — ED Provider Notes (Signed)
[place] EMERGENCY DEPARTMENT AT [facility] Provider Note   CSN: [id number] Arrival date & time: 08/19/23  2056     History  Chief Complaint  Patient presents with   Low Oxygen    Jeremy [name] is a 82 y.o. male.  HPI Patient presents from his rehabilitation facility after he was discharged earlier today.  Patient was hospitalized until today after presenting with signs and symptoms concerning for lung infection.  Patient had intubation during that hospitalization.  Patient had dialysis today, seemingly was in his usual state of affairs at the facility when he was found to have hypoxia and by nursing home staff.  EMS reports the patient was ambulatory, with no hypoxia on their measurements receiving 4 L via nasal cannula which is his new normal. EMS reports no tachycardia, no hypotension either. Patient himself is unsure about dyspnea that may have occurred earlier, currently, in the hospital gurney has no complaints.    Home Medications Prior to Admission medications   Medication Sig Start Date End Date Taking? Authorizing Provider  arformoterol (BROVANA) 15 MCG/2ML NEBU Take 2 mLs (15 mcg total) by nebulization 2 (two) times daily. 08/19/23   Regalado, Belkys A, MD  budesonide (PULMICORT) 0.5 MG/2ML nebulizer solution Take 2 mLs (0.5 mg total) by nebulization 2 (two) times daily. 08/19/23   Regalado, Belkys A, MD  cholecalciferol (VITAMIN D3) 25 MCG (1000 UNIT) tablet Take 1,000 Units by mouth daily.    [provider]  docusate (COLACE) 50 MG/5ML liquid Take 10 mLs (100 mg total) by mouth 2 (two) times daily. 08/19/23   Regalado, Belkys A, MD  guaiFENesin (ROBITUSSIN) 100 MG/5ML liquid Take 20 mLs by mouth 2 (two) times daily. 08/19/23   Regalado, Belkys A, MD  levofloxacin (LEVAQUIN) 250 MG tablet Take 1 tablet (250 mg total) by mouth daily for 2 days. 08/19/23 08/21/23  Regalado, Jon Billings A, MD  metoprolol tartrate (LOPRESSOR) 25 MG tablet Take 1 tablet (25 mg  total) by mouth 2 (two) times daily. 08/19/23   Regalado, Belkys A, MD  multivitamin (RENA-VIT) TABS tablet Take 1 tablet by mouth at bedtime. 08/19/23   Regalado, Belkys A, MD  nicotine (NICODERM CQ - DOSED IN MG/24 HOURS) 14 mg/24hr patch Place 1 patch (14 mg total) onto the skin daily. 08/19/23   Regalado, Belkys A, MD  polyethylene glycol (MIRALAX / GLYCOLAX) 17 g packet Take 17 g by mouth daily. 08/19/23   Regalado, Belkys A, MD  revefenacin (YUPELRI) 175 MCG/3ML nebulizer solution Take 3 mLs (175 mcg total) by nebulization daily. 08/20/23   Regalado, Belkys A, MD  sevelamer carbonate (RENVELA) 800 MG tablet Take 800-2,400 mg by mouth See admin instructions. Take 2400 mg with each meal and [phone] mg with each snack 09/20/22   [provider]      Allergies    Sulfa antibiotics, Penicillins, and Tape    Review of Systems   Review of Systems  All other systems reviewed and are negative.   Physical Exam Updated Vital Signs BP 120/63   Pulse 87   Resp (!) 24   SpO2 93%  Physical Exam Vitals and nursing note reviewed.  Constitutional:      Appearance: He is well-developed. He is ill-appearing.  HENT:     Head: Normocephalic and atraumatic.  Eyes:     Conjunctiva/sclera: Conjunctivae normal.  Cardiovascular:     Rate and Rhythm: Normal rate and regular rhythm.  Pulmonary:     Effort: Tachypnea present.  Breath sounds: Decreased air movement present. No stridor.  Abdominal:     General: There is no distension.  Skin:    General: Skin is warm and dry.  Neurological:     Mental Status: He is alert and oriented to person, place, and time.     ED Results / Procedures / Treatments   Labs (all labs ordered are listed, but only abnormal results are displayed) Labs Reviewed  COMPREHENSIVE METABOLIC PANEL - Abnormal; Notable for the following components:      Result Value   Potassium 5.3 (*)    Chloride 93 (*)    Glucose, Bld 152 (*)    BUN 65 (*)    Creatinine,  Ser 4.76 (*)    Total Bilirubin 1.3 (*)    GFR, Estimated 11 (*)    Anion gap 17 (*)    All other components within normal limits  CBC WITH DIFFERENTIAL/PLATELET - Abnormal; Notable for the following components:   WBC 12.1 (*)    RBC 4.09 (*)    Neutro Abs 9.5 (*)    Monocytes Absolute 1.1 (*)    Abs Immature Granulocytes 0.43 (*)    All other components within normal limits  BRAIN NATRIURETIC PEPTIDE - Abnormal; Notable for the following components:   B Natriuretic Peptide 319.7 (*)    All other components within normal limits  RESP PANEL BY RT-PCR (RSV, FLU A&B, COVID)  [id number]    EKG EKG Interpretation Date/Time:  Tuesday August 19 2023 21:12:57 EDT Ventricular Rate:  87 PR Interval:  148 QRS Duration:  93 QT Interval:  366 QTC Calculation: 441 R Axis:   -34  Text Interpretation: Sinus rhythm Ventricular premature complex Left atrial enlargement Left axis deviation Confirmed by Gerhard Munch [phone]) on 08/19/2023 9:17:58 PM  Radiology DG Chest Port 1 View  Result Date: 08/19/2023 CLINICAL DATA:  Shortness of breath EXAM: PORTABLE CHEST 1 VIEW COMPARISON:  08/15/2023 FINDINGS: No acute airspace disease. Small left-sided effusion. Stable cardiomediastinal silhouette with aortic atherosclerosis. No pneumothorax IMPRESSION: Small left effusion. Electronically Signed   By: Jasmine Pang M.D.   On: 08/19/2023 22:06    Procedures Procedures    Medications Ordered in ED Medications  levofloxacin (LEVAQUIN) tablet 250 mg (has no administration in time range)  metoprolol tartrate (LOPRESSOR) tablet 25 mg (has no administration in time range)  budesonide (PULMICORT) nebulizer solution 0.5 mg (has no administration in time range)    ED Course/ Medical Decision Making/ A&P                                 Medical Decision Making Elderly male with multiple medical issues including COPD, chronic cigarette smoker, end-stage renal dialysis including today now presents with his  facility with concern for hypoxia.  With his ongoing therapy for pneumonia as well as after mentioned problems, differential including pneumonia, bacteremia, sepsis, COPD exacerbation considered. Absence of complaints of pain, or fever are somewhat reassuring. Cardiac 85 sinus normal Pulse ox 93% 4 L nasal cannula abnormal   Amount and/or Complexity of Data Reviewed Independent Historian: EMS External Data Reviewed: notes.    Details: Discharge summary from today reviewed Labs: ordered. Decision-making details documented in ED Course. Radiology: ordered and independent interpretation performed. Decision-making details documented in ED Course. ECG/medicine tests: ordered and independent interpretation performed. Decision-making details documented in ED Course.  Risk Prescription drug management.   11:02 PM Patient in no distress, states  that he feels good.  He is on 3 L, saturation 96%, he has no complaints.  Wife now present.  She clarifies that she was with the patient earlier at the rehab facility when the possible episode of hypoxia occurred.  The patient was standing, attempting to urinate with self-catheterization, and his oxygen may or may not have been appropriately located.  After a few moments of measured hypoxia, patient back to normal saturation has remained so throughout.  Today's labs x-ray reviewed, COVID-negative, labs consistent with multiple prior studies, mild hyperkalemia, for which she will receive Lokelma, otherwise reassuring, EKG reassuring, x-ray without evidence for new pneumonia, and leukocytosis has diminished since most recent study.  Patient, wife both comfortable with returning to the rehabilitation facility given the absence of evidence for bacteremia, sepsis, distress or any other acute findings.        Final Clinical Impression(s) / ED Diagnoses Final diagnoses:  SOB (shortness of breath)  Hyperkalemia     Gerhard Munch, MD 08/19/23 2303

## 2023-08-19 NOTE — Progress Notes (Signed)
Pt to d/c to snf today. Contacted [facility] to advise clinic of pt's d/c today and that pt should resume care on Thursday. Clinic was advised of snf that pt will admit to at d/c.   Olivia Canter Renal Navigator [phone]

## 2023-08-19 NOTE — ED Notes (Signed)
Discharged instructions reviewed with pt & pts wife. No needs or questions expressed at this time.

## 2023-08-19 NOTE — Progress Notes (Signed)
[facility] Progress Note  Subjective: pt seen on HD. Got 1.7 L UF today on HD. Pt w/o c/o's.   Vitals:   08/19/23 1115 08/19/23 1130 08/19/23 1200 08/19/23 1211  BP: (!) 96/54 (!) 103/41 (!) (P) 80/45 (!) 97/58  Pulse: 96 98 100   Resp: 20 16 18 20   Temp:    97.6 F (36.4 C)  TempSrc:    Oral  SpO2: 96% 96% 95%   Weight:      Height:        Exam: Alert, on 2L [place], open mouth starting at ceiling Not in distress, chronically ill appearing though Chest - CTA bilat Cor reg no mrg Abd soft, ntnd  Ext no edema UE/ LE"s   LUA AVF+ bruit     Renal related home meds: - metoprolol xl 50 every day - renvela 2400mg  ac tid    OP HD: SW TTS  3h   73.3kg   400/600  2/2 bath  Heparin 2500  LUA AVF Hectorol 2  Last Mircera 7/2    9/29 CXR - R lower lung atx vs pna, no edema   Assessment/Plan: Acute on chronic resp failure - prob pna and volume components. Off the vent tuesday. Hx of COPD. Pt has poor respiratory reserve and doesn't tolerate volume excess very well here. Getting steroid taper here and po abx w/ cipro. Plan is for dc today.    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 Had 5 HD sessions here last week due to volume issues assoc w/ liquid diet. Had HD here this am.  Sepsis / UTI/ PNA- UCx grew enterobacter and sputum cx grew pseudomonas. SP IV cefepime --> now switched to po cipro.   Volume - was 68.5kg pre HD this am --> 1.7 L off, will lower his dry wt to 67.5kg upon dc today. .  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  HTN - getting metoprolol bid  Urinary retention - per pmd Anemia of esrd - Hb here 10- 12 range, no esa needs.  Chronic resp failure - due to OSA + COPD H/o bladder cancer - recent cystoscopy in February 2023 without evidence of recurrent disease. Dispo - for dc today    Rob Arlean Hopping MD  [facility] 08/19/2023, 12:53 PM  Recent Labs  Lab 08/14/23 0425 08/15/23 0948 08/18/23 0648 08/19/23 0423 08/19/23 0730  HGB 11.0*   <  > 12.2* 12.5*  --   ALBUMIN 3.0*  --   --   --  3.3*  CALCIUM 8.3*   < > 8.9  --  8.4*  PHOS 4.6  --   --   --  9.0*  CREATININE 4.39*   < > 5.79*  --  7.38*  K 5.4*   < > 4.8  --  5.7*   < > = values in this interval not displayed.   No results for input(s): "IRON", "TIBC", "FERRITIN" in the last 168 hours. Inpatient medications:  arformoterol  15 mcg Nebulization BID   budesonide (PULMICORT) nebulizer solution  0.5 mg Nebulization BID   Chlorhexidine Gluconate Cloth  6 each Topical Daily   Chlorhexidine Gluconate Cloth  6 each Topical Q0600   docusate  100 mg Oral BID   doxercalciferol  2 mcg Intravenous Q T,Th,Sa-HD   guaiFENesin  20 mL Oral BID   heparin  5,000 Units Subcutaneous Q8H   insulin aspart  0-9 Units Subcutaneous TID WC   levofloxacin  250 mg  Oral Daily   metoprolol tartrate  25 mg Oral BID   multivitamin  1 tablet Oral QHS   nicotine  14 mg Transdermal Daily   mouth rinse  15 mL Mouth Rinse 4 times per day   polyethylene glycol  17 g Oral Daily   revefenacin  175 mcg Nebulization Daily   sevelamer carbonate  1.6 g Oral TID WC     albuterol, hydrALAZINE, labetalol, mouth rinse

## 2023-08-19 NOTE — ED Notes (Signed)
Report called to [facility] and spoke with Zandra. Unit secretary to contact [facility] and request transport.

## 2023-08-19 NOTE — Progress Notes (Signed)
Attempted report x2. Spoke with Technical sales engineer who transferred this nurse to the admitting unit and no response. [facility] is here to transport the pt. Will give them a message to call this nurse for report.

## 2023-08-19 NOTE — Discharge Instructions (Signed)
Please be sure to speak with your primary care physician tomorrow to ensure that he has spoken with your rehabilitation staff to discuss appropriate measures for your stay.  Return here for concerning changes in your condition.

## 2023-08-19 NOTE — ED Triage Notes (Signed)
Pt BIB [facility] from [facility]. Pt was discharged today from the hospital for a lung infection and UTI. Pt had dialysis today.  Gets dialysis Tues, Thu, Sat. Got back to the rehab today at 4pm. Pt self caths 3x a day. Staff reports 15 minutes after standing to self cath O2 sats dropped to the 70s. Pt was ambulatory w/ EMS. 4L  at baseline.Pt denies any pain, GCS 15.  EMS VS 119/58 87 96% on 4L 20

## 2023-08-19 NOTE — Progress Notes (Addendum)
Attempted report x1. Spoke with Donnal Moat who transferred this nurse to the nurses station with no response.

## 2023-08-19 NOTE — Discharge Summary (Signed)
Physician Discharge Summary   Patient: Jeremy [name] MRN: [id number] DOB: [birth year]  Admit date:     08/10/2023  Discharge date: 08/19/23  Discharge Physician: Alba Cory   PCP: Everrett Coombe, DO   Recommendations at discharge:    Needs to follow Dysphagia 1 diet Honey Thick to prevent aspiration.  Needs to follow up with HD.  Monitor CBC> Needs to use CPAP.   Discharge Diagnoses: Active Problems:   Hypoxia   Acute on chronic respiratory failure with hypercapnia (HCC)   Malnutrition of moderate degree  Resolved Problems:   * No resolved hospital problems. 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 He completed hemodialysis the day prior to admission. He was noted to be coughing more. He continues to smoke 1 pack/day. Patient failed a trial on BiPAP and he was intubated on 9/29. He was admitted with acute metabolic encephalopathy secondary to acidosis, shock, sepsis secondary to urinary tract infection versus pneumonia. He required IV pressors and admitted by CCM. He was extubated 10/01. Off pressors. Care transferred to [facility] 10/02.   Patient respiratory status stable, he is on 2 L oxygen, home L. Plan to transfer to SNF today   Assessment and Plan: 1-Acute metabolic encephalopathy, in the setting of acidosis, infection, hypoxia, Hypercapnia.  -CT Head: No acute intracranial abnormality. -Patient became more confuse 10/03. Suspect delirium. Change  cefepime to Cipro. -10/04: patient was Lethargic. Placed on BIPAP. Improved.  -improved.    Severe Sepsis,  shock required IV pressors -In the setting of pneumonia and UTI -He has been off of pressors -Chest x-ray on 9/29: Showed right lower lobe atelectasis or pneumonia. -Respiratory  culture growing  Pseudomonas -Urine culture grew: Enterbacter Cloacae.  -Completed 7 days antibiotics , discharge on 2 more days of levaquin   Leukocytosis; suspect related to aspiration event over weekend. resume Levaquin for 3 days.   Afebrile, monitor.   Acute on chronic hypoxic respiratory failure with hypercapnia OSA not on CPAP -Patient require intubation 9/29, extubated 10/01. -Continue with bronchodilators -Transition to oral prednisone.  -Respiratory failure could be in the setting of acute pulmonary edema concern he may under dialyzed due to change in hemodialysis regimen -Concern for pneumonia, sputum culture growing Pseudomonas -Respiratory toilet, flutter valve, incentive spirometry -Repeated chest x ray 10/4; Diffuse bilateral interstitial pulmonary opacity, consistent with edema or atypical/viral infection. Small layering left pleural effusion. -BNP elevated. Placed on BIPAP , had exta HD 2.7 L removed.  -Fluid restriction.  -he is on 2 L oxygen, he uses 2 L at home.  Stable on 2 L.    ESRD on hemodialysis Hyponatremia: Correction with HD.  Hyperkalemia: Correction with HD.  Anion gap metabolic acidosis     Mild Elevation of troponins In the setting of sepsis.  No chest pain.   Hypertension; on metoprolol   Anemia of chronic kidney disease Monitor Hb.    Deconditioning Urine Retention; Continue self catheterization.    Dysphagia;  Evaluated by speech. On dysphagia 1 diet honey thick Consulted  speech    Moderate Malnutrition; needs supplement.   Nutrition Problem: Moderate Malnutrition Etiology: chronic illness (ESRD/HD, COPD)            Consultants: Nephrology, CCM Procedures performed: HD, Mechanical intubation.  Disposition: Skilled nursing facility Diet recommendation: Dysphagia 1,  honey thick  DISCHARGE MEDICATION: Allergies as of 08/19/2023       Reactions   Sulfa Antibiotics    Rash, light headed, shaking   Penicillins Other (See Comments)   hives  severe as a child   Tape    Pulls skin off        Medication List     STOP taking these medications    metoprolol succinate 50 MG 24 hr tablet Commonly known as: TOPROL-XL       TAKE these medications    arformoterol 15 MCG/2ML Nebu Commonly known as: BROVANA Take 2 mLs (15 mcg total) by nebulization 2 (two) times daily.   budesonide 0.5 MG/2ML nebulizer solution Commonly known as: PULMICORT Take 2 mLs (0.5 mg total) by nebulization 2 (two) times daily.   cholecalciferol 25 MCG (1000 UNIT) tablet Commonly known as: VITAMIN D3 Take 1,000 Units by mouth daily.   docusate 50 MG/5ML liquid Commonly known as: COLACE Take 10 mLs (100 mg total) by mouth 2 (two) times daily.   guaiFENesin 100 MG/5ML liquid Commonly known as: ROBITUSSIN Take 20 mLs by mouth 2 (two) times daily.   levofloxacin 250 MG tablet Commonly known as: LEVAQUIN Take 1 tablet (250 mg total) by mouth daily for 2 days.   metoprolol tartrate 25 MG tablet Commonly known as: LOPRESSOR Take 1 tablet (25 mg total) by mouth 2 (two) times daily.   multivitamin Tabs tablet Take 1 tablet by mouth at bedtime.   nicotine 14 mg/24hr patch Commonly known as: NICODERM CQ - dosed in mg/24 hours Place 1 patch (14 mg total) onto the skin daily.   polyethylene glycol 17 g packet Commonly known as: MIRALAX / GLYCOLAX Take 17 g by mouth daily.   revefenacin 175 MCG/3ML nebulizer solution Commonly known as: YUPELRI Take 3 mLs (175 mcg total) by nebulization daily. Start taking on: August 20, 2023   sevelamer carbonate 800 MG tablet Commonly known as: RENVELA Take 800-2,400 mg by mouth See admin instructions. Take 2400 mg with each meal and [phone] mg with each snack               Durable Medical Equipment  (From admission, onward)           Start     Ordered   08/18/23 1433  For home use only DME continuous positive airway pressure (CPAP)  Once       Question Answer Comment  Length of Need  6 Months   Patient has OSA or probable OSA Yes   Settings Autotitration   CPAP supplies needed Mask, headgear, cushions, filters, heated tubing and water chamber      08/18/23 1433   08/14/23 1331  For home use only DME oxygen  Once       Question Answer Comment  Length of Need 6 Months   Liters per Minute 3   Frequency Continuous (stationary and portable oxygen unit needed)   Oxygen delivery system Gas      08/14/23 1330            Contact information for after-discharge care     Destination     [facility] Preferred SNF .   Service: Skilled Nursing Contact information: [address] [place] [address] [phone]                    Discharge Exam: Filed Weights   08/16/23 0141 08/17/23 0437 08/19/23 0805  Weight: 69.5 kg 68.2  kg 68.5 kg   General; Alert, in no distress  Condition at discharge: stable  The results of significant diagnostics from this hospitalization (including imaging, microbiology, ancillary and laboratory) are listed below for reference.   Imaging Studies: DG CHEST PORT 1 VIEW  Result Date: 08/15/2023 CLINICAL DATA:  Hypoxia, shortness of breath EXAM: PORTABLE CHEST 1 VIEW COMPARISON:  08/10/2023 FINDINGS: The heart size and mediastinal contours are within normal limits. Diffuse bilateral interstitial pulmonary opacity. Small layering left pleural effusion. The visualized skeletal structures are unremarkable. IMPRESSION: Diffuse bilateral interstitial pulmonary opacity, consistent with edema or atypical/viral infection. Small layering left pleural effusion. Electronically Signed   By: Jearld Lesch M.D.   On: 08/15/2023 08:24   DG Abd Portable 1V  Result Date: 08/10/2023 CLINICAL DATA:  OG tube placement EXAM: PORTABLE ABDOMEN - 1 VIEW COMPARISON:  None Available. FINDINGS: OG tube tip is in the proximal stomach with the side port in the distal esophagus. Nonobstructive bowel gas pattern.  IMPRESSION: OG tube in the proximal stomach near the GE junction with the side port in the distal esophagus. Electronically Signed   By: Charlett Nose M.D.   On: 08/10/2023 19:31   DG Chest Portable 1 View  Result Date: 08/10/2023 CLINICAL DATA:  Post intubation EXAM: PORTABLE CHEST 1 VIEW COMPARISON:  08/10/2023 FINDINGS: Endotracheal tube is 7 cm above the carina. NG tube enters the stomach. Heart and mediastinal contours are within normal limits. Consolidation in the right lower lobe. No confluent opacity on the left. No effusions or acute bony abnormality. IMPRESSION: Endotracheal tube 7 cm above the carina. Right lower lobe atelectasis or pneumonia. Electronically Signed   By: Charlett Nose M.D.   On: 08/10/2023 19:30   CT HEAD WO CONTRAST ( )  Result Date: 08/10/2023 CLINICAL DATA:  Mental status change, unknown cause EXAM: CT HEAD WITHOUT CONTRAST TECHNIQUE: Contiguous axial images were obtained from the base of the skull through the vertex without intravenous contrast. RADIATION DOSE REDUCTION: This exam was performed according to the departmental dose-optimization program which includes automated exposure control, adjustment of the mA and/or kV according to patient size and/or use of iterative reconstruction technique. COMPARISON:  None Available. FINDINGS: Brain: There is atrophy and chronic small vessel disease changes. No acute intracranial abnormality. Specifically, no hemorrhage, hydrocephalus, mass lesion, acute infarction, or significant intracranial injury. Vascular: No hyperdense vessel or unexpected calcification. Skull: No acute calvarial abnormality. Sinuses/Orbits: No acute findings Other: None IMPRESSION: Atrophy, chronic microvascular disease. No acute intracranial abnormality. Electronically Signed   By: Charlett Nose M.D.   On: 08/10/2023 18:56   DG Chest Portable 1 View  Result Date: 08/10/2023 CLINICAL DATA:  Shortness of breath. EXAM: PORTABLE CHEST 1 VIEW COMPARISON:  Chest  radiograph dated 10/24/2022. FINDINGS: There is diffuse chronic interstitial coarsening. There is mild cardiomegaly with vascular congestion. No focal consolidation, pleural effusion, or pneumothorax. No acute osseous pathology. IMPRESSION: Mild cardiomegaly with vascular congestion. Electronically Signed   By: Elgie Collard M.D.   On: 08/10/2023 16:25    Microbiology: Results for orders placed or performed during the hospital encounter of 08/10/23  SARS Coronavirus 2 by RT PCR (hospital order, performed in [facility] hospital lab) *cepheid single result test* Anterior Nasal Swab     Status: None   Collection Time: 08/10/23  4:08 PM   Specimen: Anterior Nasal Swab  Result Value Ref Range Status   SARS Coronavirus 2 by RT PCR NEGATIVE NEGATIVE Final    Comment: Performed at [facility]  Lab, [address] [place], [place] [address]  Blood culture (routine x 2)     Status: None   Collection Time: 08/10/23  5:18 PM   Specimen: BLOOD RIGHT ARM  Result Value Ref Range Status   Specimen Description BLOOD RIGHT ARM  Final   Special Requests   Final    BOTTLES DRAWN AEROBIC AND ANAEROBIC Blood Culture adequate volume   Culture   Final    NO GROWTH 5 DAYS Performed at [facility] Lab, [address], [place], [place] [address]    Report Status 08/15/2023 FINAL  Final  Blood culture (routine x 2)     Status: None   Collection Time: 08/10/23  5:47 PM   Specimen: BLOOD RIGHT ARM  Result Value Ref Range Status   Specimen Description BLOOD RIGHT ARM  Final   Special Requests   Final    BOTTLES DRAWN AEROBIC AND ANAEROBIC Blood Culture adequate volume   Culture   Final    NO GROWTH 5 DAYS Performed at [facility] Lab, [address] [place], [place] [address]    Report Status 08/15/2023 FINAL  Final  Urine Culture (for pregnant, neutropenic or urologic patients or patients with an indwelling urinary catheter)     Status: Abnormal   Collection Time: 08/10/23  6:16 PM   Specimen:  In/Out Cath Urine  Result Value Ref Range Status   Specimen Description IN/OUT CATH URINE  Final   Special Requests   Final    NONE Performed at [facility] Lab, [address], [place], [place] [address]    Culture >=100,000 COLONIES/mL ENTEROBACTER CLOACAE (A)  Final   Report Status 08/13/2023 FINAL  Final   Organism ID, Bacteria ENTEROBACTER CLOACAE (A)  Final      Susceptibility   Enterobacter cloacae - MIC*    CEFEPIME <=0.12 SENSITIVE Sensitive     CIPROFLOXACIN <=0.25 SENSITIVE Sensitive     GENTAMICIN <=1 SENSITIVE Sensitive     IMIPENEM 1 SENSITIVE Sensitive     NITROFURANTOIN 32 SENSITIVE Sensitive     TRIMETH/SULFA <=20 SENSITIVE Sensitive     PIP/TAZO <=4 SENSITIVE Sensitive     * >=100,000 COLONIES/mL ENTEROBACTER CLOACAE  Culture, Respiratory w Gram Stain     Status: None   Collection Time: 08/11/23  8:07 AM   Specimen: Tracheal Aspirate; Respiratory  Result Value Ref Range Status   Specimen Description TRACHEAL ASPIRATE  Final   Special Requests NONE  Final   Gram Stain   Final    FEW WBC PRESENT, PREDOMINANTLY PMN ABUNDANT GRAM POSITIVE COCCI IN CLUSTERS FEW GRAM POSITIVE COCCI IN CHAINS FEW GRAM POSITIVE RODS Performed at [facility] Lab, [address] [place], [place] [address]    Culture FEW PSEUDOMONAS AERUGINOSA  Final   Report Status 08/13/2023 FINAL  Final   Organism ID, Bacteria PSEUDOMONAS AERUGINOSA  Final      Susceptibility   Pseudomonas aeruginosa - MIC*    CEFTAZIDIME 4 SENSITIVE Sensitive     CIPROFLOXACIN <=0.25 SENSITIVE Sensitive     GENTAMICIN 2 SENSITIVE Sensitive     IMIPENEM 1 SENSITIVE Sensitive     PIP/TAZO 8 SENSITIVE Sensitive     CEFEPIME 2 SENSITIVE Sensitive     * FEW PSEUDOMONAS AERUGINOSA  Respiratory (~20 pathogens) panel by PCR     Status: None   Collection Time: 08/11/23  6:50 PM  Result Value Ref Range Status   Adenovirus NOT DETECTED NOT DETECTED Final  Coronavirus 229E NOT DETECTED NOT DETECTED Final     Comment: (NOTE) The Coronavirus on the Respiratory Panel, DOES NOT test for the novel  Coronavirus (2019 nCoV)    Coronavirus HKU1 NOT DETECTED NOT DETECTED Final   Coronavirus NL63 NOT DETECTED NOT DETECTED Final   Coronavirus OC43 NOT DETECTED NOT DETECTED Final   Metapneumovirus NOT DETECTED NOT DETECTED Final   Rhinovirus / Enterovirus NOT DETECTED NOT DETECTED Final   Influenza A NOT DETECTED NOT DETECTED Final   Influenza B NOT DETECTED NOT DETECTED Final   Parainfluenza Virus 1 NOT DETECTED NOT DETECTED Final   Parainfluenza Virus 2 NOT DETECTED NOT DETECTED Final   Parainfluenza Virus 3 NOT DETECTED NOT DETECTED Final   Parainfluenza Virus 4 NOT DETECTED NOT DETECTED Final   Respiratory Syncytial Virus NOT DETECTED NOT DETECTED Final   Bordetella pertussis NOT DETECTED NOT DETECTED Final   Bordetella Parapertussis NOT DETECTED NOT DETECTED Final   Chlamydophila pneumoniae NOT DETECTED NOT DETECTED Final   Mycoplasma pneumoniae NOT DETECTED NOT DETECTED Final    Comment: Performed at [facility] Lab, [address] [place], [place] [address]  MRSA Next Gen by PCR, Nasal     Status: None   Collection Time: 08/12/23 10:12 AM   Specimen: Nasal Mucosa; Nasal Swab  Result Value Ref Range Status   MRSA by PCR Next Gen NOT DETECTED NOT DETECTED Final    Comment: (NOTE) The GeneXpert MRSA Assay (FDA approved for NASAL specimens only), is one component of a comprehensive MRSA colonization surveillance program. It is not intended to diagnose MRSA infection nor to guide or monitor treatment for MRSA infections. Test performance is not FDA approved in patients less than 2 years old. Performed at [facility] Lab, [address] [place], [place] [address]     Labs: CBC: Recent Labs  Lab 08/13/23 0803 08/14/23 0425 08/15/23 0948 08/18/23 0648 08/19/23 0423  WBC 11.3* 10.4 8.9 13.1* 14.1*  HGB 10.3* 11.0* 11.2* 12.2* 12.5*  HCT 32.3* 33.7* 35.4* 37.9* 38.1*  MCV 96.4  98.0 97.0 96.2 97.9  PLT 267 263 291 323 322   Basic Metabolic Panel: Recent Labs  Lab 08/12/23 1651 08/13/23 0803 08/14/23 0425 08/15/23 0948 08/18/23 0648 08/19/23 0730  NA  --  132* 133* 134* 135 135  K  --  5.9* 5.4* 4.7 4.8 5.7*  CL  --  93* 94* 93* 93* 92*  CO2  --  22 24 27 25 23   GLUCOSE  --  134* 117* 132* 99 101*  BUN  --  91* 55* 63* 82* 117*  CREATININE  --  7.02* 4.39* 4.66* 5.79* 7.38*  CALCIUM  --  8.5* 8.3* 8.4* 8.9 8.4*  MG 2.3  --   --   --   --   --   PHOS 7.4*  --  4.6  --   --  9.0*   Liver Function Tests: Recent Labs  Lab 08/14/23 0425 08/19/23 0730  ALBUMIN 3.0* 3.3*   CBG: Recent Labs  Lab 08/17/23 2123 08/18/23 0837 08/18/23 1157 08/18/23 1606 08/18/23 2034  GLUCAP 114* 108* 122* 177* 120*    Discharge time spent: greater than 30 minutes.  Signed: Alba Cory, MD [facility] 08/19/2023

## 2023-08-19 NOTE — Progress Notes (Signed)
Nutrition Follow-up  DOCUMENTATION CODES:   Non-severe (moderate) malnutrition in context of chronic illness  INTERVENTION:   - Magic Cup TID with meals, each supplement provides 290 kcal and 9 grams of protein   - Renal MVI daily  NUTRITION DIAGNOSIS:   Moderate Malnutrition related to chronic illness (ESRD/HD, COPD) as evidenced by moderate fat depletion, severe muscle depletion.  Ongoing.  GOAL:   Patient will meet greater than or equal to 90% of their needs  Progressing.  MONITOR:   PO intake, Supplement acceptance, Labs, Weight trends, I & O's  ASSESSMENT:   82 year old male who presented to the ED on 9/20 with SOB. 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 Pt admitted with acute encephalopathy, shock.  9/29 -admitted, intubated after trial on BiPAP 10/1 - extubated, diet advanced to Heart Healthy/Carb Modified with nectar-thick liquids 10/2 -SLP recommended Dys 1 pudding thick liquids 10/4 -NPO 10/5 -back on dysphagia 1 diet pudding thick liquids 10/6 -advanced to honey thick liquids   Patient currently in HD. Typically on a TTS HD schedule.  Patient eating meals, consuming 100%. Pt expected to discharge to SNF per TOC note.   Admission weight: 162 lbs Current weight: 151 lbs EDW:  73.3 kg  Medications: Colace, Renavit, Miralax, Renvela  Labs reviewed: CBGs: 108-219 Elevated K (5.7) Elevated Phos (9.0)  Diet Order:   Diet Order             DIET - DYS 1 Room service appropriate? No; Fluid consistency: Honey Thick; Fluid restriction: 1200 mL Fluid  Diet effective now                   EDUCATION NEEDS:   Not appropriate for education at this time  Skin:  Skin Assessment: Reviewed RN Assessment (skin tear L shoulder)  Last BM:  10/8 -type 6  Height:   Ht Readings from Last 1 Encounters:  08/10/23 5'  10" (1.778 m)    Weight:   Wt Readings from Last 1 Encounters:  08/19/23 68.5 kg    BMI:  Body mass index is 21.67 kg/m.  Estimated Nutritional Needs:   Kcal:  1800-2000  Protein:  90-110 grams  Fluid:  1000 ml + UOP   Tilda Franco, MS, RD, LDN Inpatient Clinical Dietitian Contact information available via Amion

## 2023-08-19 NOTE — TOC Transition Note (Signed)
Transition of Care [facility]) - CM/SW Discharge Note   Patient Details  Name: Jeremy [name] MRN: [id number] Date of Birth: [birth year]  [facility]) CM/SW Contact:  Delilah Shan, LCSWA Phone Number: 08/19/2023, 12:49 PM   Clinical Narrative:     Patient will DC to: [facility]   Anticipated DC date: 08/19/2023  Family notified: [name]  Transport by: Sharin Mons  ?  Per MD patient ready for DC to [facility] . RN, patient, patient's family, [place] Renal Navigator,and facility notified of DC. Cpap at facility.Discharge Summary sent to facility. RN given number for report tele#[phone] RM# [phone] . DC packet on chart. Ambulance transport requested for patient.  CSW signing off.    Final next level of care: Skilled Nursing Facility Barriers to Discharge: No Barriers Identified   Patient Goals and CMS Choice CMS Medicare.gov Compare Post Acute Care list provided to:: Patient Represenative (must comment) (spouse) Choice offered to / list presented to : Spouse  Discharge Placement                Patient [facility] bed at: [facility] Patient to be transferred to facility by: [facility] Name of family member notified: Pat Patient and family notified of of transfer: 08/19/23  Discharge Plan and Services Additional resources added to the After Visit Summary for     Discharge Planning Services: CM Consult                                 Social Determinants of Health (SDOH) Interventions SDOH Screenings   Food Insecurity: No Food Insecurity (08/14/2023)  Housing: Low Risk  (08/14/2023)  Transportation Needs: No Transportation Needs (08/14/2023)  Utilities: Not At Risk (08/14/2023)  Depression (PHQ2-9): Low Risk  (05/19/2023)  Tobacco Use: High Risk (08/14/2023)     Readmission Risk Interventions     No data to display

## 2023-08-19 NOTE — Progress Notes (Signed)
   08/19/23 0200  BiPAP/CPAP/SIPAP  Reason BIPAP/CPAP not in use Non-compliant (pt refused)

## 2023-08-19 NOTE — TOC Progression Note (Signed)
Transition of Care [facility]) - Progression Note    Patient Details  Name: Jeremy [name] MRN: [id number] Date of Birth: Mar 15, 1941  [facility] CM/SW Contact  Delilah Shan, LCSWA Phone Number: 08/19/2023, 10:46 AM  Clinical Narrative:     CSW spoke with Star with [facility] who confirmed SNF bed for patient today if medically ready. Star confirmed that cpap will arrive at facility today. CSW will continue to follow.  Expected Discharge Plan: IP Rehab Facility Barriers to Discharge: Continued Medical Work up  Expected Discharge Plan and Services   Discharge Planning Services: CM Consult   Living arrangements for the past 2 months: Single Family Home                                       Social Determinants of Health (SDOH) Interventions SDOH Screenings   Food Insecurity: No Food Insecurity (08/14/2023)  Housing: Low Risk  (08/14/2023)  Transportation Needs: No Transportation Needs (08/14/2023)  Utilities: Not At Risk (08/14/2023)  Depression (PHQ2-9): Low Risk  (05/19/2023)  Tobacco Use: High Risk (08/14/2023)    Readmission Risk Interventions     No data to display

## 2023-08-19 NOTE — Discharge Planning (Signed)
[facility] Patient Discharge Orders- [facility] CLINIC: SW  Patient's name: RANDIE TALLARICO Admit/DC Dates: 08/10/2023 - 08/19/2023  Discharge Diagnoses: Acute on chronic Resp Failure 2/2 PNA + increased volume  Sepsis 2/2 to UTI and PNA s/p IV ABX course AMS  Aranesp: Given: no    Last Hgb: 12.5 PRBC's Given: no  ESA dose for discharge: no change IV Iron dose at discharge: no  Heparin change: no  EDW Change: yes New EDW: 67.5kg  Bath Change: no  Access intervention/Change: no Details:  Hectorol/Calcitriol change: no  Discharge Labs: Calcium 8.4 Phosphorus 9.0 Albumin 3.3 K+ 5.7  IV Antibiotics: no Details: Levaquin 250mg  PO x 2d  On Coumadin?: no Last INR: Next INR: Managed By:   OTHER/APPTS/LAB ORDERS:    D/C Meds to be reconciled by nurse after every discharge.  Completed By: Virgina Norfolk, PA-C   Reviewed by: MD:______ RN_______

## 2023-08-20 ENCOUNTER — Encounter (HOSPITAL_COMMUNITY): Payer: Self-pay | Admitting: Emergency Medicine

## 2023-08-20 ENCOUNTER — Telehealth: Payer: Self-pay | Admitting: Family Medicine

## 2023-08-20 DIAGNOSIS — D72829 Elevated white blood cell count, unspecified: Secondary | ICD-10-CM | POA: Diagnosis not present

## 2023-08-20 DIAGNOSIS — F17210 Nicotine dependence, cigarettes, uncomplicated: Secondary | ICD-10-CM | POA: Diagnosis not present

## 2023-08-20 DIAGNOSIS — J42 Unspecified chronic bronchitis: Secondary | ICD-10-CM | POA: Diagnosis not present

## 2023-08-20 DIAGNOSIS — E440 Moderate protein-calorie malnutrition: Secondary | ICD-10-CM | POA: Diagnosis not present

## 2023-08-20 DIAGNOSIS — Z992 Dependence on renal dialysis: Secondary | ICD-10-CM | POA: Diagnosis not present

## 2023-08-20 DIAGNOSIS — R5381 Other malaise: Secondary | ICD-10-CM | POA: Diagnosis not present

## 2023-08-20 DIAGNOSIS — J9622 Acute and chronic respiratory failure with hypercapnia: Secondary | ICD-10-CM | POA: Diagnosis not present

## 2023-08-20 DIAGNOSIS — Z720 Tobacco use: Secondary | ICD-10-CM | POA: Diagnosis not present

## 2023-08-20 DIAGNOSIS — R339 Retention of urine, unspecified: Secondary | ICD-10-CM | POA: Diagnosis not present

## 2023-08-20 DIAGNOSIS — D509 Iron deficiency anemia, unspecified: Secondary | ICD-10-CM | POA: Diagnosis not present

## 2023-08-20 DIAGNOSIS — Z87440 Personal history of urinary (tract) infections: Secondary | ICD-10-CM | POA: Diagnosis not present

## 2023-08-20 DIAGNOSIS — R1312 Dysphagia, oropharyngeal phase: Secondary | ICD-10-CM | POA: Diagnosis not present

## 2023-08-20 DIAGNOSIS — R0602 Shortness of breath: Secondary | ICD-10-CM | POA: Diagnosis not present

## 2023-08-20 DIAGNOSIS — Z7401 Bed confinement status: Secondary | ICD-10-CM | POA: Diagnosis not present

## 2023-08-20 DIAGNOSIS — E875 Hyperkalemia: Secondary | ICD-10-CM | POA: Diagnosis not present

## 2023-08-20 DIAGNOSIS — Z8619 Personal history of other infectious and parasitic diseases: Secondary | ICD-10-CM | POA: Diagnosis not present

## 2023-08-20 DIAGNOSIS — G4733 Obstructive sleep apnea (adult) (pediatric): Secondary | ICD-10-CM | POA: Diagnosis not present

## 2023-08-20 DIAGNOSIS — F4321 Adjustment disorder with depressed mood: Secondary | ICD-10-CM | POA: Diagnosis not present

## 2023-08-20 DIAGNOSIS — M4696 Unspecified inflammatory spondylopathy, lumbar region: Secondary | ICD-10-CM | POA: Diagnosis not present

## 2023-08-20 DIAGNOSIS — Z1331 Encounter for screening for depression: Secondary | ICD-10-CM | POA: Diagnosis not present

## 2023-08-20 DIAGNOSIS — J9621 Acute and chronic respiratory failure with hypoxia: Secondary | ICD-10-CM | POA: Diagnosis not present

## 2023-08-20 DIAGNOSIS — R2689 Other abnormalities of gait and mobility: Secondary | ICD-10-CM | POA: Diagnosis not present

## 2023-08-20 DIAGNOSIS — J151 Pneumonia due to Pseudomonas: Secondary | ICD-10-CM | POA: Diagnosis not present

## 2023-08-20 DIAGNOSIS — Z23 Encounter for immunization: Secondary | ICD-10-CM | POA: Diagnosis not present

## 2023-08-20 DIAGNOSIS — J449 Chronic obstructive pulmonary disease, unspecified: Secondary | ICD-10-CM | POA: Diagnosis not present

## 2023-08-20 DIAGNOSIS — G9341 Metabolic encephalopathy: Secondary | ICD-10-CM | POA: Diagnosis not present

## 2023-08-20 DIAGNOSIS — Z9989 Dependence on other enabling machines and devices: Secondary | ICD-10-CM | POA: Diagnosis not present

## 2023-08-20 DIAGNOSIS — R41841 Cognitive communication deficit: Secondary | ICD-10-CM | POA: Diagnosis not present

## 2023-08-20 DIAGNOSIS — I10 Essential (primary) hypertension: Secondary | ICD-10-CM | POA: Diagnosis not present

## 2023-08-20 DIAGNOSIS — Z7189 Other specified counseling: Secondary | ICD-10-CM | POA: Diagnosis not present

## 2023-08-20 DIAGNOSIS — R1310 Dysphagia, unspecified: Secondary | ICD-10-CM | POA: Diagnosis not present

## 2023-08-20 DIAGNOSIS — I120 Hypertensive chronic kidney disease with stage 5 chronic kidney disease or end stage renal disease: Secondary | ICD-10-CM | POA: Diagnosis not present

## 2023-08-20 DIAGNOSIS — M6281 Muscle weakness (generalized): Secondary | ICD-10-CM | POA: Diagnosis not present

## 2023-08-20 DIAGNOSIS — N2581 Secondary hyperparathyroidism of renal origin: Secondary | ICD-10-CM | POA: Diagnosis not present

## 2023-08-20 DIAGNOSIS — E871 Hypo-osmolality and hyponatremia: Secondary | ICD-10-CM | POA: Diagnosis not present

## 2023-08-20 DIAGNOSIS — R531 Weakness: Secondary | ICD-10-CM | POA: Diagnosis not present

## 2023-08-20 DIAGNOSIS — Z20822 Contact with and (suspected) exposure to covid-19: Secondary | ICD-10-CM | POA: Diagnosis not present

## 2023-08-20 DIAGNOSIS — N186 End stage renal disease: Secondary | ICD-10-CM | POA: Diagnosis not present

## 2023-08-20 DIAGNOSIS — R0902 Hypoxemia: Secondary | ICD-10-CM | POA: Diagnosis not present

## 2023-08-20 DIAGNOSIS — J99 Respiratory disorders in diseases classified elsewhere: Secondary | ICD-10-CM | POA: Diagnosis not present

## 2023-08-20 DIAGNOSIS — D8481 Immunodeficiency due to conditions classified elsewhere: Secondary | ICD-10-CM | POA: Diagnosis not present

## 2023-08-20 DIAGNOSIS — R7989 Other specified abnormal findings of blood chemistry: Secondary | ICD-10-CM | POA: Diagnosis not present

## 2023-08-20 DIAGNOSIS — D631 Anemia in chronic kidney disease: Secondary | ICD-10-CM | POA: Diagnosis not present

## 2023-08-20 DIAGNOSIS — F5101 Primary insomnia: Secondary | ICD-10-CM | POA: Diagnosis not present

## 2023-08-20 NOTE — Telephone Encounter (Signed)
Patient's wife advised. She is very frustrated with them not knowing all the medications he is taking. She wanted to do a virtual visit on her own about her husband. I did tell her he would need to be available for the virtual visit. I also told her that I am not sure he could do a virtual visit while in the rehab.

## 2023-08-20 NOTE — Telephone Encounter (Signed)
Patients wife called in to make you aware that the patient was admitted at [facility] from 9/29-10/8. He was discharged from the hospital and went to [facility] rehab. After his admit to [facility], he was then sent back to the hospital for low oxygen during self-cath. She also states that he is having issues with eating since he was intubated in the hospital. The Doctor at [facility] wanted her to call the patient's PCP for help in caring for the patient.

## 2023-08-20 NOTE — Telephone Encounter (Signed)
Left message on voicemail for a return call. 

## 2023-08-21 DIAGNOSIS — R7989 Other specified abnormal findings of blood chemistry: Secondary | ICD-10-CM | POA: Diagnosis not present

## 2023-08-21 DIAGNOSIS — J9621 Acute and chronic respiratory failure with hypoxia: Secondary | ICD-10-CM | POA: Diagnosis not present

## 2023-08-21 DIAGNOSIS — E875 Hyperkalemia: Secondary | ICD-10-CM | POA: Diagnosis not present

## 2023-08-21 DIAGNOSIS — N186 End stage renal disease: Secondary | ICD-10-CM | POA: Diagnosis not present

## 2023-08-21 DIAGNOSIS — J9622 Acute and chronic respiratory failure with hypercapnia: Secondary | ICD-10-CM | POA: Diagnosis not present

## 2023-08-21 DIAGNOSIS — E440 Moderate protein-calorie malnutrition: Secondary | ICD-10-CM | POA: Diagnosis not present

## 2023-08-21 DIAGNOSIS — D509 Iron deficiency anemia, unspecified: Secondary | ICD-10-CM | POA: Diagnosis not present

## 2023-08-21 DIAGNOSIS — Z992 Dependence on renal dialysis: Secondary | ICD-10-CM | POA: Diagnosis not present

## 2023-08-21 DIAGNOSIS — Z720 Tobacco use: Secondary | ICD-10-CM | POA: Diagnosis not present

## 2023-08-21 DIAGNOSIS — R339 Retention of urine, unspecified: Secondary | ICD-10-CM | POA: Diagnosis not present

## 2023-08-21 DIAGNOSIS — G9341 Metabolic encephalopathy: Secondary | ICD-10-CM | POA: Diagnosis not present

## 2023-08-21 DIAGNOSIS — Z87440 Personal history of urinary (tract) infections: Secondary | ICD-10-CM | POA: Diagnosis not present

## 2023-08-21 DIAGNOSIS — D72829 Elevated white blood cell count, unspecified: Secondary | ICD-10-CM | POA: Diagnosis not present

## 2023-08-21 DIAGNOSIS — I120 Hypertensive chronic kidney disease with stage 5 chronic kidney disease or end stage renal disease: Secondary | ICD-10-CM | POA: Diagnosis not present

## 2023-08-21 DIAGNOSIS — E871 Hypo-osmolality and hyponatremia: Secondary | ICD-10-CM | POA: Diagnosis not present

## 2023-08-21 DIAGNOSIS — Z23 Encounter for immunization: Secondary | ICD-10-CM | POA: Diagnosis not present

## 2023-08-21 DIAGNOSIS — Z8619 Personal history of other infectious and parasitic diseases: Secondary | ICD-10-CM | POA: Diagnosis not present

## 2023-08-21 DIAGNOSIS — N2581 Secondary hyperparathyroidism of renal origin: Secondary | ICD-10-CM | POA: Diagnosis not present

## 2023-08-21 DIAGNOSIS — D631 Anemia in chronic kidney disease: Secondary | ICD-10-CM | POA: Diagnosis not present

## 2023-08-21 DIAGNOSIS — J151 Pneumonia due to Pseudomonas: Secondary | ICD-10-CM | POA: Diagnosis not present

## 2023-08-21 DIAGNOSIS — R5381 Other malaise: Secondary | ICD-10-CM | POA: Diagnosis not present

## 2023-08-21 DIAGNOSIS — R1310 Dysphagia, unspecified: Secondary | ICD-10-CM | POA: Diagnosis not present

## 2023-08-22 NOTE — Telephone Encounter (Signed)
Patient's wife advised

## 2023-08-22 NOTE — Telephone Encounter (Signed)
Left message for a return call

## 2023-08-23 DIAGNOSIS — Z992 Dependence on renal dialysis: Secondary | ICD-10-CM | POA: Diagnosis not present

## 2023-08-23 DIAGNOSIS — D509 Iron deficiency anemia, unspecified: Secondary | ICD-10-CM | POA: Diagnosis not present

## 2023-08-23 DIAGNOSIS — N186 End stage renal disease: Secondary | ICD-10-CM | POA: Diagnosis not present

## 2023-08-23 DIAGNOSIS — N2581 Secondary hyperparathyroidism of renal origin: Secondary | ICD-10-CM | POA: Diagnosis not present

## 2023-08-23 DIAGNOSIS — Z23 Encounter for immunization: Secondary | ICD-10-CM | POA: Diagnosis not present

## 2023-08-25 DIAGNOSIS — F4321 Adjustment disorder with depressed mood: Secondary | ICD-10-CM | POA: Diagnosis not present

## 2023-08-25 DIAGNOSIS — F5101 Primary insomnia: Secondary | ICD-10-CM | POA: Diagnosis not present

## 2023-08-26 DIAGNOSIS — D509 Iron deficiency anemia, unspecified: Secondary | ICD-10-CM | POA: Diagnosis not present

## 2023-08-26 DIAGNOSIS — N2581 Secondary hyperparathyroidism of renal origin: Secondary | ICD-10-CM | POA: Diagnosis not present

## 2023-08-26 DIAGNOSIS — Z992 Dependence on renal dialysis: Secondary | ICD-10-CM | POA: Diagnosis not present

## 2023-08-26 DIAGNOSIS — N186 End stage renal disease: Secondary | ICD-10-CM | POA: Diagnosis not present

## 2023-08-26 DIAGNOSIS — Z23 Encounter for immunization: Secondary | ICD-10-CM | POA: Diagnosis not present

## 2023-08-28 DIAGNOSIS — Z23 Encounter for immunization: Secondary | ICD-10-CM | POA: Diagnosis not present

## 2023-08-28 DIAGNOSIS — Z992 Dependence on renal dialysis: Secondary | ICD-10-CM | POA: Diagnosis not present

## 2023-08-28 DIAGNOSIS — N186 End stage renal disease: Secondary | ICD-10-CM | POA: Diagnosis not present

## 2023-08-28 DIAGNOSIS — D509 Iron deficiency anemia, unspecified: Secondary | ICD-10-CM | POA: Diagnosis not present

## 2023-08-28 DIAGNOSIS — N2581 Secondary hyperparathyroidism of renal origin: Secondary | ICD-10-CM | POA: Diagnosis not present

## 2023-08-30 DIAGNOSIS — N2581 Secondary hyperparathyroidism of renal origin: Secondary | ICD-10-CM | POA: Diagnosis not present

## 2023-08-30 DIAGNOSIS — N186 End stage renal disease: Secondary | ICD-10-CM | POA: Diagnosis not present

## 2023-08-30 DIAGNOSIS — Z23 Encounter for immunization: Secondary | ICD-10-CM | POA: Diagnosis not present

## 2023-08-30 DIAGNOSIS — D509 Iron deficiency anemia, unspecified: Secondary | ICD-10-CM | POA: Diagnosis not present

## 2023-08-30 DIAGNOSIS — Z992 Dependence on renal dialysis: Secondary | ICD-10-CM | POA: Diagnosis not present

## 2023-09-02 DIAGNOSIS — N186 End stage renal disease: Secondary | ICD-10-CM | POA: Diagnosis not present

## 2023-09-02 DIAGNOSIS — Z23 Encounter for immunization: Secondary | ICD-10-CM | POA: Diagnosis not present

## 2023-09-02 DIAGNOSIS — N2581 Secondary hyperparathyroidism of renal origin: Secondary | ICD-10-CM | POA: Diagnosis not present

## 2023-09-02 DIAGNOSIS — D509 Iron deficiency anemia, unspecified: Secondary | ICD-10-CM | POA: Diagnosis not present

## 2023-09-02 DIAGNOSIS — Z992 Dependence on renal dialysis: Secondary | ICD-10-CM | POA: Diagnosis not present

## 2023-09-03 DIAGNOSIS — F17210 Nicotine dependence, cigarettes, uncomplicated: Secondary | ICD-10-CM | POA: Diagnosis not present

## 2023-09-03 DIAGNOSIS — R339 Retention of urine, unspecified: Secondary | ICD-10-CM | POA: Diagnosis not present

## 2023-09-03 DIAGNOSIS — Z992 Dependence on renal dialysis: Secondary | ICD-10-CM | POA: Diagnosis not present

## 2023-09-03 DIAGNOSIS — Z8619 Personal history of other infectious and parasitic diseases: Secondary | ICD-10-CM | POA: Diagnosis not present

## 2023-09-03 DIAGNOSIS — G4733 Obstructive sleep apnea (adult) (pediatric): Secondary | ICD-10-CM | POA: Diagnosis not present

## 2023-09-03 DIAGNOSIS — D631 Anemia in chronic kidney disease: Secondary | ICD-10-CM | POA: Diagnosis not present

## 2023-09-03 DIAGNOSIS — N186 End stage renal disease: Secondary | ICD-10-CM | POA: Diagnosis not present

## 2023-09-03 DIAGNOSIS — D72829 Elevated white blood cell count, unspecified: Secondary | ICD-10-CM | POA: Diagnosis not present

## 2023-09-03 DIAGNOSIS — Z7189 Other specified counseling: Secondary | ICD-10-CM | POA: Diagnosis not present

## 2023-09-03 DIAGNOSIS — Z1331 Encounter for screening for depression: Secondary | ICD-10-CM | POA: Diagnosis not present

## 2023-09-03 DIAGNOSIS — J9621 Acute and chronic respiratory failure with hypoxia: Secondary | ICD-10-CM | POA: Diagnosis not present

## 2023-09-03 DIAGNOSIS — R1310 Dysphagia, unspecified: Secondary | ICD-10-CM | POA: Diagnosis not present

## 2023-09-03 DIAGNOSIS — Z87440 Personal history of urinary (tract) infections: Secondary | ICD-10-CM | POA: Diagnosis not present

## 2023-09-03 DIAGNOSIS — J151 Pneumonia due to Pseudomonas: Secondary | ICD-10-CM | POA: Diagnosis not present

## 2023-09-04 ENCOUNTER — Other Ambulatory Visit (HOSPITAL_COMMUNITY): Payer: Self-pay | Admitting: *Deleted

## 2023-09-04 DIAGNOSIS — N2581 Secondary hyperparathyroidism of renal origin: Secondary | ICD-10-CM | POA: Diagnosis not present

## 2023-09-04 DIAGNOSIS — Z23 Encounter for immunization: Secondary | ICD-10-CM | POA: Diagnosis not present

## 2023-09-04 DIAGNOSIS — Z992 Dependence on renal dialysis: Secondary | ICD-10-CM | POA: Diagnosis not present

## 2023-09-04 DIAGNOSIS — N186 End stage renal disease: Secondary | ICD-10-CM | POA: Diagnosis not present

## 2023-09-04 DIAGNOSIS — R1310 Dysphagia, unspecified: Secondary | ICD-10-CM

## 2023-09-04 DIAGNOSIS — R059 Cough, unspecified: Secondary | ICD-10-CM

## 2023-09-04 DIAGNOSIS — D509 Iron deficiency anemia, unspecified: Secondary | ICD-10-CM | POA: Diagnosis not present

## 2023-09-05 DIAGNOSIS — G4733 Obstructive sleep apnea (adult) (pediatric): Secondary | ICD-10-CM | POA: Diagnosis not present

## 2023-09-05 DIAGNOSIS — R339 Retention of urine, unspecified: Secondary | ICD-10-CM | POA: Diagnosis not present

## 2023-09-05 DIAGNOSIS — Z9989 Dependence on other enabling machines and devices: Secondary | ICD-10-CM | POA: Diagnosis not present

## 2023-09-05 DIAGNOSIS — Z992 Dependence on renal dialysis: Secondary | ICD-10-CM | POA: Diagnosis not present

## 2023-09-05 DIAGNOSIS — D72829 Elevated white blood cell count, unspecified: Secondary | ICD-10-CM | POA: Diagnosis not present

## 2023-09-05 DIAGNOSIS — D631 Anemia in chronic kidney disease: Secondary | ICD-10-CM | POA: Diagnosis not present

## 2023-09-05 DIAGNOSIS — R1310 Dysphagia, unspecified: Secondary | ICD-10-CM | POA: Diagnosis not present

## 2023-09-05 DIAGNOSIS — I10 Essential (primary) hypertension: Secondary | ICD-10-CM | POA: Diagnosis not present

## 2023-09-05 DIAGNOSIS — J151 Pneumonia due to Pseudomonas: Secondary | ICD-10-CM | POA: Diagnosis not present

## 2023-09-05 DIAGNOSIS — J9621 Acute and chronic respiratory failure with hypoxia: Secondary | ICD-10-CM | POA: Diagnosis not present

## 2023-09-05 DIAGNOSIS — N186 End stage renal disease: Secondary | ICD-10-CM | POA: Diagnosis not present

## 2023-09-06 DIAGNOSIS — D509 Iron deficiency anemia, unspecified: Secondary | ICD-10-CM | POA: Diagnosis not present

## 2023-09-06 DIAGNOSIS — Z23 Encounter for immunization: Secondary | ICD-10-CM | POA: Diagnosis not present

## 2023-09-06 DIAGNOSIS — N2581 Secondary hyperparathyroidism of renal origin: Secondary | ICD-10-CM | POA: Diagnosis not present

## 2023-09-06 DIAGNOSIS — N186 End stage renal disease: Secondary | ICD-10-CM | POA: Diagnosis not present

## 2023-09-06 DIAGNOSIS — Z992 Dependence on renal dialysis: Secondary | ICD-10-CM | POA: Diagnosis not present

## 2023-09-09 ENCOUNTER — Telehealth: Payer: Self-pay | Admitting: Family Medicine

## 2023-09-09 DIAGNOSIS — Z23 Encounter for immunization: Secondary | ICD-10-CM | POA: Diagnosis not present

## 2023-09-09 DIAGNOSIS — N2581 Secondary hyperparathyroidism of renal origin: Secondary | ICD-10-CM | POA: Diagnosis not present

## 2023-09-09 DIAGNOSIS — E440 Moderate protein-calorie malnutrition: Secondary | ICD-10-CM | POA: Diagnosis not present

## 2023-09-09 DIAGNOSIS — R1310 Dysphagia, unspecified: Secondary | ICD-10-CM | POA: Diagnosis not present

## 2023-09-09 DIAGNOSIS — M48061 Spinal stenosis, lumbar region without neurogenic claudication: Secondary | ICD-10-CM | POA: Diagnosis not present

## 2023-09-09 DIAGNOSIS — R41841 Cognitive communication deficit: Secondary | ICD-10-CM | POA: Diagnosis not present

## 2023-09-09 DIAGNOSIS — G4733 Obstructive sleep apnea (adult) (pediatric): Secondary | ICD-10-CM | POA: Diagnosis not present

## 2023-09-09 DIAGNOSIS — Z466 Encounter for fitting and adjustment of urinary device: Secondary | ICD-10-CM | POA: Diagnosis not present

## 2023-09-09 DIAGNOSIS — G9341 Metabolic encephalopathy: Secondary | ICD-10-CM | POA: Diagnosis not present

## 2023-09-09 DIAGNOSIS — J440 Chronic obstructive pulmonary disease with acute lower respiratory infection: Secondary | ICD-10-CM | POA: Diagnosis not present

## 2023-09-09 DIAGNOSIS — F5101 Primary insomnia: Secondary | ICD-10-CM | POA: Diagnosis not present

## 2023-09-09 DIAGNOSIS — N186 End stage renal disease: Secondary | ICD-10-CM | POA: Diagnosis not present

## 2023-09-09 DIAGNOSIS — I120 Hypertensive chronic kidney disease with stage 5 chronic kidney disease or end stage renal disease: Secondary | ICD-10-CM | POA: Diagnosis not present

## 2023-09-09 DIAGNOSIS — R339 Retention of urine, unspecified: Secondary | ICD-10-CM | POA: Diagnosis not present

## 2023-09-09 DIAGNOSIS — F17210 Nicotine dependence, cigarettes, uncomplicated: Secondary | ICD-10-CM | POA: Diagnosis not present

## 2023-09-09 DIAGNOSIS — E871 Hypo-osmolality and hyponatremia: Secondary | ICD-10-CM | POA: Diagnosis not present

## 2023-09-09 DIAGNOSIS — Z992 Dependence on renal dialysis: Secondary | ICD-10-CM | POA: Diagnosis not present

## 2023-09-09 DIAGNOSIS — D631 Anemia in chronic kidney disease: Secondary | ICD-10-CM | POA: Diagnosis not present

## 2023-09-09 DIAGNOSIS — Z9981 Dependence on supplemental oxygen: Secondary | ICD-10-CM | POA: Diagnosis not present

## 2023-09-09 DIAGNOSIS — B952 Enterococcus as the cause of diseases classified elsewhere: Secondary | ICD-10-CM | POA: Diagnosis not present

## 2023-09-09 DIAGNOSIS — J151 Pneumonia due to Pseudomonas: Secondary | ICD-10-CM | POA: Diagnosis not present

## 2023-09-09 DIAGNOSIS — J9622 Acute and chronic respiratory failure with hypercapnia: Secondary | ICD-10-CM | POA: Diagnosis not present

## 2023-09-09 DIAGNOSIS — E875 Hyperkalemia: Secondary | ICD-10-CM | POA: Diagnosis not present

## 2023-09-09 DIAGNOSIS — Z87440 Personal history of urinary (tract) infections: Secondary | ICD-10-CM | POA: Diagnosis not present

## 2023-09-09 DIAGNOSIS — D509 Iron deficiency anemia, unspecified: Secondary | ICD-10-CM | POA: Diagnosis not present

## 2023-09-09 DIAGNOSIS — N400 Enlarged prostate without lower urinary tract symptoms: Secondary | ICD-10-CM | POA: Diagnosis not present

## 2023-09-09 DIAGNOSIS — M4696 Unspecified inflammatory spondylopathy, lumbar region: Secondary | ICD-10-CM | POA: Diagnosis not present

## 2023-09-09 DIAGNOSIS — J9621 Acute and chronic respiratory failure with hypoxia: Secondary | ICD-10-CM | POA: Diagnosis not present

## 2023-09-09 NOTE — Telephone Encounter (Signed)
[facility] called from [facility] he was admitted today 09-09-23 for Nursing, PT, OT, ST Oxygen 3 liters order stated between 1-5 liters to keep his oxygen about 92 percent he doing ok on 3 liters   [phone] If you have any questions

## 2023-09-10 ENCOUNTER — Telehealth: Payer: Self-pay | Admitting: Family Medicine

## 2023-09-10 NOTE — Telephone Encounter (Signed)
Patient's wife called [facility] is requesting an order for Portable O2    [facility] Fax [phone]

## 2023-09-11 DIAGNOSIS — N2581 Secondary hyperparathyroidism of renal origin: Secondary | ICD-10-CM | POA: Diagnosis not present

## 2023-09-11 DIAGNOSIS — N186 End stage renal disease: Secondary | ICD-10-CM | POA: Diagnosis not present

## 2023-09-11 DIAGNOSIS — D509 Iron deficiency anemia, unspecified: Secondary | ICD-10-CM | POA: Diagnosis not present

## 2023-09-11 DIAGNOSIS — Z992 Dependence on renal dialysis: Secondary | ICD-10-CM | POA: Diagnosis not present

## 2023-09-11 DIAGNOSIS — Z23 Encounter for immunization: Secondary | ICD-10-CM | POA: Diagnosis not present

## 2023-09-12 DIAGNOSIS — Z992 Dependence on renal dialysis: Secondary | ICD-10-CM | POA: Diagnosis not present

## 2023-09-12 DIAGNOSIS — I129 Hypertensive chronic kidney disease with stage 1 through stage 4 chronic kidney disease, or unspecified chronic kidney disease: Secondary | ICD-10-CM | POA: Diagnosis not present

## 2023-09-12 DIAGNOSIS — N186 End stage renal disease: Secondary | ICD-10-CM | POA: Diagnosis not present

## 2023-09-12 MED ORDER — AMBULATORY NON FORMULARY MEDICATION: 0 refills | Status: DC

## 2023-09-12 NOTE — Telephone Encounter (Signed)
Rx printed and placed in Panya's basket.

## 2023-09-13 DIAGNOSIS — N2581 Secondary hyperparathyroidism of renal origin: Secondary | ICD-10-CM | POA: Diagnosis not present

## 2023-09-13 DIAGNOSIS — Z992 Dependence on renal dialysis: Secondary | ICD-10-CM | POA: Diagnosis not present

## 2023-09-13 DIAGNOSIS — N186 End stage renal disease: Secondary | ICD-10-CM | POA: Diagnosis not present

## 2023-09-13 DIAGNOSIS — D509 Iron deficiency anemia, unspecified: Secondary | ICD-10-CM | POA: Diagnosis not present

## 2023-09-15 ENCOUNTER — Ambulatory Visit (HOSPITAL_COMMUNITY)
Admission: RE | Admit: 2023-09-15 | Discharge: 2023-09-15 | Disposition: A | Discharge status: Home / Self Care | Payer: Medicare Other | Source: Ambulatory Visit | Attending: Family Medicine | Admitting: Family Medicine

## 2023-09-15 ENCOUNTER — Ambulatory Visit (HOSPITAL_COMMUNITY)
Admission: RE | Admit: 2023-09-15 | Discharge: 2023-09-15 | Disposition: A | Discharge status: Home / Self Care | Payer: Medicare Other | Source: Ambulatory Visit | Attending: Family Medicine

## 2023-09-15 DIAGNOSIS — R1312 Dysphagia, oropharyngeal phase: Secondary | ICD-10-CM

## 2023-09-15 DIAGNOSIS — K219 Gastro-esophageal reflux disease without esophagitis: Secondary | ICD-10-CM | POA: Diagnosis not present

## 2023-09-15 DIAGNOSIS — R059 Cough, unspecified: Secondary | ICD-10-CM

## 2023-09-15 DIAGNOSIS — R1310 Dysphagia, unspecified: Secondary | ICD-10-CM | POA: Diagnosis not present

## 2023-09-15 NOTE — Therapy (Signed)
Modified Barium Swallow Study  Patient Details  Name: Jeremy [name] MRN: [id number] Date of Birth: October 08, 1941  Today's Date: 09/15/2023  Modified Barium Swallow completed.  Full report located under Chart Review in the Imaging Section.  History of Present Illness Jeremy [name] is an 82 y.o. male with PMH: COPD, tobacco use, (h/o 1ppd) CKD, anemia, sleep apnea, HTN, HLD, dysphagia, ESRD on HD. He was hospitalized in October for somnolence at home and found to have low oxygen saturations in the 50%'s. He required intubation 9/29-10/1; CXR 9/29 showed right LL atelectasis or PNA. MBS completed on 10/2 reported moderate oropharyngeal dysphagia with aspiration or thin liquids, deep penetration with nectar thick and honey thick liquids. He was discharged to SNF on 10/8 and recently discharged home. He presented to this OP MBS to determine if his swallow function had improved enough. Patient really wants to be able to have regular food.   Clinical Impression Jeremy [name] continues to present with a moderately impaired pharyngeal phase dysphagia with aspiration of thin and nectar thick liquids as well as pharyngeal retention of solids and thicker liquids (honey thick, pudding thick). When compared to previous MBS completed on 08/13/23, today's test did not appear significantly different. He continues with structural dysphagia suspected to be from curvature of cervical spine causing narrowing of pharyngeal  space and resulting in reduced epiglottic inversion. He exhibits reduced base of tongue strength resulting in pharyngeal residuals (moderate in amount with honey thick, puree and soft solids) primarily at level of vallecular sinus. Anterior hyoid excursion, laryngeal elevation and epiglottic inversion were all partial in completion. He was observed to have to have consistent, silent aspiration of gross amount of barium with thin liquids slightly smaller amount of silent aspiration with nectar thick liquids. Cued  cough and reswallow did help to move some nectar thick residuals out of laryngeal vestibule. No significant difference between bolus transit and residuals with fully masticated solids (graham cracker) and puree solids. After MBS, SLP spent time discussing results and recommendations with Jeremy [name], his hired caregiver and his spouse. Jeremy [name] wants to be able to have regular foods and to be able to go to a restaurants. His wife reports she has been strictly adhering to recommendation of minced solids and honey thick liquids. SLP advised Jeremy [name] and spouse that he remains at a high aspiration risk with thin liquids and nectar thick liquids but lower risk of aspiration with honey thick liquids, puree solids and soft solids. SLP recommending he and spouse cautiously and carefully introduce some advanced solids (mechanical soft approximately) but to continue with honey thick liquids. SLP also advised them to avoid mixed consistency solids as they reintroduce advanced solids into his diet. SLP is recommending OP SLP services to continue with dysphagia management and education. Factors that may increase risk of adverse event in presence of aspiration Rubye Oaks [employer] Clearance Coots 2021): Limited mobility;Frail or deconditioned;Weak cough;Frequent aspiration of large volumes;Poor general health and/or compromised immunity  Swallow Evaluation Recommendations Recommendations: PO diet Liquid Administration via: Cup;Spoon Medication Administration: Whole meds with puree Supervision: Patient able to self-feed Swallowing strategies  : Slow rate;Small bites/sips;Avoid mixed consistencies;Multiple dry swallows after each bite/sip;Clear throat intermittently;Hard cough after swallowing Postural changes: Position pt fully upright for meals Caregiver Recommendations: Avoid jello, ice cream, thin soups, popsicles     Angela Nevin, MA, CCC-SLP Speech Therapy

## 2023-09-16 DIAGNOSIS — N2581 Secondary hyperparathyroidism of renal origin: Secondary | ICD-10-CM | POA: Diagnosis not present

## 2023-09-16 DIAGNOSIS — Z992 Dependence on renal dialysis: Secondary | ICD-10-CM | POA: Diagnosis not present

## 2023-09-16 DIAGNOSIS — I120 Hypertensive chronic kidney disease with stage 5 chronic kidney disease or end stage renal disease: Secondary | ICD-10-CM | POA: Diagnosis not present

## 2023-09-16 DIAGNOSIS — J151 Pneumonia due to Pseudomonas: Secondary | ICD-10-CM | POA: Diagnosis not present

## 2023-09-16 DIAGNOSIS — D631 Anemia in chronic kidney disease: Secondary | ICD-10-CM | POA: Diagnosis not present

## 2023-09-16 DIAGNOSIS — N186 End stage renal disease: Secondary | ICD-10-CM | POA: Diagnosis not present

## 2023-09-16 DIAGNOSIS — D509 Iron deficiency anemia, unspecified: Secondary | ICD-10-CM | POA: Diagnosis not present

## 2023-09-16 DIAGNOSIS — B952 Enterococcus as the cause of diseases classified elsewhere: Secondary | ICD-10-CM | POA: Diagnosis not present

## 2023-09-16 DIAGNOSIS — J440 Chronic obstructive pulmonary disease with acute lower respiratory infection: Secondary | ICD-10-CM | POA: Diagnosis not present

## 2023-09-17 ENCOUNTER — Telehealth: Payer: Self-pay | Admitting: Family Medicine

## 2023-09-17 DIAGNOSIS — D631 Anemia in chronic kidney disease: Secondary | ICD-10-CM | POA: Diagnosis not present

## 2023-09-17 DIAGNOSIS — I120 Hypertensive chronic kidney disease with stage 5 chronic kidney disease or end stage renal disease: Secondary | ICD-10-CM | POA: Diagnosis not present

## 2023-09-17 DIAGNOSIS — B952 Enterococcus as the cause of diseases classified elsewhere: Secondary | ICD-10-CM | POA: Diagnosis not present

## 2023-09-17 DIAGNOSIS — J151 Pneumonia due to Pseudomonas: Secondary | ICD-10-CM | POA: Diagnosis not present

## 2023-09-17 DIAGNOSIS — J440 Chronic obstructive pulmonary disease with acute lower respiratory infection: Secondary | ICD-10-CM | POA: Diagnosis not present

## 2023-09-17 DIAGNOSIS — N186 End stage renal disease: Secondary | ICD-10-CM | POA: Diagnosis not present

## 2023-09-17 NOTE — Telephone Encounter (Signed)
Received incoming call from [facility] requesting orders for OT once weekly times 7 weeks. Please advise.  Orders needs to be faxed to [phone] [phone]

## 2023-09-18 DIAGNOSIS — D509 Iron deficiency anemia, unspecified: Secondary | ICD-10-CM | POA: Diagnosis not present

## 2023-09-18 DIAGNOSIS — Z992 Dependence on renal dialysis: Secondary | ICD-10-CM | POA: Diagnosis not present

## 2023-09-18 DIAGNOSIS — N186 End stage renal disease: Secondary | ICD-10-CM | POA: Diagnosis not present

## 2023-09-18 DIAGNOSIS — N2581 Secondary hyperparathyroidism of renal origin: Secondary | ICD-10-CM | POA: Diagnosis not present

## 2023-09-18 NOTE — Telephone Encounter (Signed)
Verbal orders have been given to [facility] with fax information for documentation.

## 2023-09-18 NOTE — Telephone Encounter (Signed)
Can they take a VO or do they need a written order?

## 2023-09-19 ENCOUNTER — Telehealth: Payer: Self-pay | Admitting: Family Medicine

## 2023-09-19 DIAGNOSIS — D631 Anemia in chronic kidney disease: Secondary | ICD-10-CM | POA: Diagnosis not present

## 2023-09-19 DIAGNOSIS — N186 End stage renal disease: Secondary | ICD-10-CM | POA: Diagnosis not present

## 2023-09-19 DIAGNOSIS — J151 Pneumonia due to Pseudomonas: Secondary | ICD-10-CM | POA: Diagnosis not present

## 2023-09-19 DIAGNOSIS — B952 Enterococcus as the cause of diseases classified elsewhere: Secondary | ICD-10-CM | POA: Diagnosis not present

## 2023-09-19 DIAGNOSIS — J440 Chronic obstructive pulmonary disease with acute lower respiratory infection: Secondary | ICD-10-CM | POA: Diagnosis not present

## 2023-09-19 DIAGNOSIS — I120 Hypertensive chronic kidney disease with stage 5 chronic kidney disease or end stage renal disease: Secondary | ICD-10-CM | POA: Diagnosis not present

## 2023-09-19 NOTE — Telephone Encounter (Signed)
[facility] called has done the Home PT evaluation and to continue PT for 1 week x 4 weeks and ever other week for 4 weeks and patient also is wanting to wean off his o2 his therapist said he did get today he was at 92% today

## 2023-09-19 NOTE — Telephone Encounter (Signed)
Jeremy [name] called from [facility] requesting order for humidification for oxygen   Phone - [phone]

## 2023-09-19 NOTE — Telephone Encounter (Signed)
You can call [phone] if you approve the orders

## 2023-09-19 NOTE — Telephone Encounter (Signed)
[facility] Speech Therapy called requesting verbal orders to move the speech therapy to next week. Please advise.  Phone number - [phone]

## 2023-09-20 DIAGNOSIS — N186 End stage renal disease: Secondary | ICD-10-CM | POA: Diagnosis not present

## 2023-09-20 DIAGNOSIS — Z992 Dependence on renal dialysis: Secondary | ICD-10-CM | POA: Diagnosis not present

## 2023-09-20 DIAGNOSIS — D509 Iron deficiency anemia, unspecified: Secondary | ICD-10-CM | POA: Diagnosis not present

## 2023-09-20 DIAGNOSIS — N2581 Secondary hyperparathyroidism of renal origin: Secondary | ICD-10-CM | POA: Diagnosis not present

## 2023-09-22 ENCOUNTER — Ambulatory Visit: Payer: Medicare Other

## 2023-09-22 ENCOUNTER — Ambulatory Visit (INDEPENDENT_AMBULATORY_CARE_PROVIDER_SITE_OTHER): Payer: Medicare Other | Admitting: Family Medicine

## 2023-09-22 ENCOUNTER — Encounter: Payer: Self-pay | Admitting: Family Medicine

## 2023-09-22 VITALS — BP 144/61 | HR 81 | Ht 70.0 in | Wt 157.0 lb

## 2023-09-22 DIAGNOSIS — J9622 Acute and chronic respiratory failure with hypercapnia: Secondary | ICD-10-CM

## 2023-09-22 DIAGNOSIS — J439 Emphysema, unspecified: Secondary | ICD-10-CM | POA: Diagnosis not present

## 2023-09-22 DIAGNOSIS — R0902 Hypoxemia: Secondary | ICD-10-CM

## 2023-09-22 DIAGNOSIS — J440 Chronic obstructive pulmonary disease with acute lower respiratory infection: Secondary | ICD-10-CM | POA: Diagnosis not present

## 2023-09-22 DIAGNOSIS — R2681 Unsteadiness on feet: Secondary | ICD-10-CM | POA: Diagnosis not present

## 2023-09-22 DIAGNOSIS — J189 Pneumonia, unspecified organism: Secondary | ICD-10-CM

## 2023-09-22 DIAGNOSIS — Z09 Encounter for follow-up examination after completed treatment for conditions other than malignant neoplasm: Secondary | ICD-10-CM | POA: Diagnosis not present

## 2023-09-22 DIAGNOSIS — D631 Anemia in chronic kidney disease: Secondary | ICD-10-CM | POA: Diagnosis not present

## 2023-09-22 DIAGNOSIS — N186 End stage renal disease: Secondary | ICD-10-CM

## 2023-09-22 DIAGNOSIS — I120 Hypertensive chronic kidney disease with stage 5 chronic kidney disease or end stage renal disease: Secondary | ICD-10-CM | POA: Diagnosis not present

## 2023-09-22 DIAGNOSIS — I700 Atherosclerosis of aorta: Secondary | ICD-10-CM | POA: Diagnosis not present

## 2023-09-22 DIAGNOSIS — J9611 Chronic respiratory failure with hypoxia: Secondary | ICD-10-CM

## 2023-09-22 DIAGNOSIS — I771 Stricture of artery: Secondary | ICD-10-CM | POA: Diagnosis not present

## 2023-09-22 DIAGNOSIS — J151 Pneumonia due to Pseudomonas: Secondary | ICD-10-CM | POA: Diagnosis not present

## 2023-09-22 DIAGNOSIS — Z8709 Personal history of other diseases of the respiratory system: Secondary | ICD-10-CM | POA: Diagnosis not present

## 2023-09-22 DIAGNOSIS — B952 Enterococcus as the cause of diseases classified elsewhere: Secondary | ICD-10-CM | POA: Diagnosis not present

## 2023-09-22 MED ORDER — AMBULATORY NON FORMULARY MEDICATION: 0 refills | Status: DC

## 2023-09-22 NOTE — Progress Notes (Signed)
Jeremy [name] - 82 y.o. male MRN [id number]  Date of birth: 04-24-1941  Subjective No chief complaint on file.   HPI Jeremy [name] is a 82 y.o. male here today for follow up visit.    Hospitalized last month for COPD exacerbation.  Noted to have hypoxic respiratory failure with O2 sats at 50%.  Initially had trial of BiPap but eventually was intubated.  He was dx with sepsis with urinary vs pulmonary source.  He did require pressors during his admission.  Received antibiotics during admission and was discharged to SNF.  He has been doing rehab since discharge.  Instructed to follow dysphagia 1 diet with honey thick liquids due to aspiration concerns on swallow study. He does remain on Oxygen although he doesn't feel that he needs to be on this.  His O2 sats have been around 93-94% on 3L of O2.  Oxygen tank is quite burdensome for him and he wants to stop using for this reason.    Ear also feeling clogged today. Has had cerumen impaction in the past.   ROS:  A comprehensive ROS was completed and negative except as noted per HPI  Allergies  Allergen Reactions   Sulfa Antibiotics     Rash, light headed, shaking   Penicillins Other (See Comments)    hives severe as a child    Tape     Pulls skin off    Past Medical History:  Diagnosis Date   Anemia    Cancer (HCC)    patient and family have not been told that he has cancer.   Chronic kidney disease    dialysis T/Th/Sa   COPD (chronic obstructive pulmonary disease) (HCC)    Enlarged prostate    History of blood transfusion    Hyperlipidemia    Hypertension    Self-catheterizes urinary bladder    pt. does this morning and evening--been doing this since 11/2016   Sleep apnea    not using a cpap machine    Past Surgical History:  Procedure Laterality Date   A/V FISTULAGRAM Left 03/12/2023   Procedure: A/V Fistulagram;  Surgeon: Victorino Sparrow, MD;  Location: [facility] INVASIVE CV LAB;  Service: Cardiovascular;  Laterality: Left;   AV  FISTULA PLACEMENT Left 10/16/2022   Procedure: LEFT BRACHIOCEPHALIC ARTERIOVENOUS (AV) FISTULA CREATION;  Surgeon: Cephus Shelling, MD;  Location: [facility] OR;  Service: Vascular;  Laterality: Left;   BACK SURGERY     BLADDER TUMOR EXCISION  2015   benign   BLEPHAROPLASTY  12/2018   LIGATION OF COMPETING BRANCHES OF ARTERIOVENOUS FISTULA Left 03/17/2023   Procedure: LEFT ARM FISTULA BRANCH LIGATION;  Surgeon: Victorino Sparrow, MD;  Location: [facility] OR;  Service: Vascular;  Laterality: Left;   LUMBAR LAMINECTOMY/DECOMPRESSION MICRODISCECTOMY Bilateral 08/25/2017   Procedure: Bilateral Lumbar Three- Four, Lumbar Four- Five, Lumbar Five- Sacral One Laminectomy;  Surgeon: Barnett Abu, MD;  Location: [facility] OR;  Service: Neurosurgery;  Laterality: Bilateral;  Bilateral L3-4 L4-5 L5-S1 Laminectomy   SKIN CANCER EXCISION     Surg x2...   TONSILLECTOMY AND ADENOIDECTOMY     Surg as a child...   TRANSURETHRAL RESECTION OF BLADDER TUMOR N/A 01/03/2022   Procedure: TRANSURETHRAL RESECTION OF BLADDER TUMOR (TURBT)/ CYSTOSCOPY/ EXAM UNDER ANESTHESIA, BILATERAL RETROGRADE/ BLADDER BIOPSIES;  Surgeon: Heloise Purpura, MD;  Location: [facility] ORS;  Service: Urology;  Laterality: N/A;  GENERAL ANESTHESIA WITH PARALYSIS    Social History   Socioeconomic History   Marital status: Married  Spouse name: Not on file   Number of children: Not on file   Years of education: Not on file   Highest education level: Not on file  Occupational History   Not on file  Tobacco Use   Smoking status: Every Day    Current packs/day: 0.20    Average packs/day: 0.2 packs/day for 61.7 years (12.3 ttl pk-yrs)    Types: Cigarettes    Start date: 12/29/1961    Passive exposure: Never   Smokeless tobacco: Never   Tobacco comments:    Smokes 1/2 pack a day   Vaping Use   Vaping status: Never Used  Substance and Sexual Activity   Alcohol use: Not Currently    Comment: couple glasses with dinner daily   Drug use: No   Sexual activity:  Not on file  Other Topics Concern   Not on file  Social History Narrative   Lives with wife in a 2 story home.  Has 2 children.  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)   Social Determinants of Health   Financial Resource Strain: Not on file  Food Insecurity: No Food Insecurity (08/14/2023)   Hunger Vital Sign    Worried About Running Out of Food in the Last Year: Never true    Ran Out of Food in the Last Year: Never true  Transportation Needs: No Transportation Needs (08/14/2023)   PRAPARE - Administrator, Civil Service (Medical): No    Lack of Transportation (Non-Medical): No  Physical Activity: Not on file  Stress: Not on file  Social Connections: Not on file    Family History  Problem Relation Age of Onset   Colon cancer Neg Hx    Stomach cancer Neg Hx    Rectal cancer Neg Hx     Health Maintenance  Topic Date Due   Medicare Annual Wellness (AWV)  Never done   Zoster Vaccines- Shingrix (1 of 2) 05/29/1959   COVID-19 Vaccine (5 - 2023-24 season) 07/13/2023   DTaP/Tdap/Td (3 - Td or Tdap) 07/04/2031   Pneumonia Vaccine 56+ Years old  Completed   INFLUENZA VACCINE  Completed   HPV VACCINES  Aged Out     ----------------------------------------------------------------------------------------------------------------------------------------------------------------------------------------------------------------- Physical Exam BP (!) 144/61 (BP Location: Right Arm, Patient Position: Sitting, Cuff Size: Normal)   Pulse 81   Ht 5\' 10"  (1.778 m)   Wt 157 lb (71.2 kg)   SpO2 97%   BMI 22.53 kg/m   Physical Exam Constitutional:      Appearance: Normal appearance.  HENT:     Head: Normocephalic and atraumatic.  Cardiovascular:     Rate and Rhythm: Normal rate and regular rhythm.  Pulmonary:     Effort: Pulmonary effort is normal.     Breath sounds: Normal breath sounds.  Musculoskeletal:     Cervical back: Neck supple.  Neurological:      General: No focal deficit present.     Mental Status: He is alert.  Psychiatric:        Mood and Affect: Mood normal.        Behavior: Behavior normal.     ------------------------------------------------------------------------------------------------------------------------------------------------------------------------------------------------------------------- Assessment and Plan  ESRD (end stage renal disease) (HCC) Currently receiving hemodialysis.  He will continue under supervision of nephrology.  Gait instability Continues to work with physical therapy.  Encouraged use of assistive device such as walker or at minimum a cane  Acute on chronic respiratory failure with hypercapnia (HCC) Remains oxygen dependent.  He is frustrated with  difficulty transporting oxygen.  Will see if we can get him a portable concentrator think he is willing to be more compliant with this.    Hypoxia Treated for pneumonia recently.  Will repeat chest x-ray.  Continue O2.   Meds ordered this encounter  Medications   AMBULATORY NON FORMULARY MEDICATION    Sig: Please provide portable oxygen concentrator.  Flow 3L/min.  Dx: Chronic respiratory failure J96.11, COPD J42    Dispense:  1 Device    Refill:  0    Return in about 2 months (around 11/22/2023), or F/u COPD.    This visit occurred during the SARS-CoV-2 public health emergency.  Safety protocols were in place, including screening questions prior to the visit, additional usage of staff PPE, and extensive cleaning of exam room while observing appropriate contact time as indicated for disinfecting solutions.

## 2023-09-22 NOTE — Assessment & Plan Note (Signed)
Treated for pneumonia recently.  Will repeat chest x-ray.  Continue O2.

## 2023-09-22 NOTE — Assessment & Plan Note (Signed)
Currently receiving hemodialysis.  He will continue under supervision of nephrology.

## 2023-09-22 NOTE — Assessment & Plan Note (Signed)
Continues to work with physical therapy.  Encouraged use of assistive device such as walker or at minimum a cane

## 2023-09-22 NOTE — Assessment & Plan Note (Signed)
Remains oxygen dependent.  He is frustrated with difficulty transporting oxygen.  Will see if we can get him a portable concentrator think he is willing to be more compliant with this.

## 2023-09-23 DIAGNOSIS — D509 Iron deficiency anemia, unspecified: Secondary | ICD-10-CM | POA: Diagnosis not present

## 2023-09-23 DIAGNOSIS — N2581 Secondary hyperparathyroidism of renal origin: Secondary | ICD-10-CM | POA: Diagnosis not present

## 2023-09-23 DIAGNOSIS — Z992 Dependence on renal dialysis: Secondary | ICD-10-CM | POA: Diagnosis not present

## 2023-09-23 DIAGNOSIS — N186 End stage renal disease: Secondary | ICD-10-CM | POA: Diagnosis not present

## 2023-09-23 NOTE — Telephone Encounter (Signed)
Home Health advised.  

## 2023-09-23 NOTE — Telephone Encounter (Signed)
Left message for a return call

## 2023-09-24 ENCOUNTER — Telehealth: Payer: Self-pay | Admitting: Family Medicine

## 2023-09-24 DIAGNOSIS — I120 Hypertensive chronic kidney disease with stage 5 chronic kidney disease or end stage renal disease: Secondary | ICD-10-CM | POA: Diagnosis not present

## 2023-09-24 DIAGNOSIS — N186 End stage renal disease: Secondary | ICD-10-CM | POA: Diagnosis not present

## 2023-09-24 DIAGNOSIS — D631 Anemia in chronic kidney disease: Secondary | ICD-10-CM | POA: Diagnosis not present

## 2023-09-24 DIAGNOSIS — J151 Pneumonia due to Pseudomonas: Secondary | ICD-10-CM | POA: Diagnosis not present

## 2023-09-24 DIAGNOSIS — B952 Enterococcus as the cause of diseases classified elsewhere: Secondary | ICD-10-CM | POA: Diagnosis not present

## 2023-09-24 DIAGNOSIS — J440 Chronic obstructive pulmonary disease with acute lower respiratory infection: Secondary | ICD-10-CM | POA: Diagnosis not present

## 2023-09-24 NOTE — Telephone Encounter (Signed)
Lucienne Minks with [facility] called.  Rx needed for Oxygen. She also said rx has to be written a certain way. Phone Contact: [phone]

## 2023-09-24 NOTE — Telephone Encounter (Signed)
Returned call. LVM for call back with phone number.

## 2023-09-25 DIAGNOSIS — D509 Iron deficiency anemia, unspecified: Secondary | ICD-10-CM | POA: Diagnosis not present

## 2023-09-25 DIAGNOSIS — Z992 Dependence on renal dialysis: Secondary | ICD-10-CM | POA: Diagnosis not present

## 2023-09-25 DIAGNOSIS — N186 End stage renal disease: Secondary | ICD-10-CM | POA: Diagnosis not present

## 2023-09-25 DIAGNOSIS — N2581 Secondary hyperparathyroidism of renal origin: Secondary | ICD-10-CM | POA: Diagnosis not present

## 2023-09-26 ENCOUNTER — Telehealth: Payer: Self-pay

## 2023-09-26 DIAGNOSIS — D225 Melanocytic nevi of trunk: Secondary | ICD-10-CM | POA: Diagnosis not present

## 2023-09-26 DIAGNOSIS — L814 Other melanin hyperpigmentation: Secondary | ICD-10-CM | POA: Diagnosis not present

## 2023-09-26 DIAGNOSIS — Z85828 Personal history of other malignant neoplasm of skin: Secondary | ICD-10-CM | POA: Diagnosis not present

## 2023-09-26 DIAGNOSIS — D0471 Carcinoma in situ of skin of right lower limb, including hip: Secondary | ICD-10-CM | POA: Diagnosis not present

## 2023-09-26 DIAGNOSIS — C44311 Basal cell carcinoma of skin of nose: Secondary | ICD-10-CM | POA: Diagnosis not present

## 2023-09-26 DIAGNOSIS — L84 Corns and callosities: Secondary | ICD-10-CM | POA: Diagnosis not present

## 2023-09-26 DIAGNOSIS — L821 Other seborrheic keratosis: Secondary | ICD-10-CM | POA: Diagnosis not present

## 2023-09-26 DIAGNOSIS — J151 Pneumonia due to Pseudomonas: Secondary | ICD-10-CM | POA: Diagnosis not present

## 2023-09-26 DIAGNOSIS — B952 Enterococcus as the cause of diseases classified elsewhere: Secondary | ICD-10-CM | POA: Diagnosis not present

## 2023-09-26 DIAGNOSIS — N186 End stage renal disease: Secondary | ICD-10-CM | POA: Diagnosis not present

## 2023-09-26 DIAGNOSIS — I120 Hypertensive chronic kidney disease with stage 5 chronic kidney disease or end stage renal disease: Secondary | ICD-10-CM | POA: Diagnosis not present

## 2023-09-26 DIAGNOSIS — J440 Chronic obstructive pulmonary disease with acute lower respiratory infection: Secondary | ICD-10-CM | POA: Diagnosis not present

## 2023-09-26 DIAGNOSIS — D631 Anemia in chronic kidney disease: Secondary | ICD-10-CM | POA: Diagnosis not present

## 2023-09-26 NOTE — Telephone Encounter (Signed)
Spoke to [employer]. She provided additional information concerning portable O2 Rx needed by [facility].   Faxed Rx, demo's, last OV note, and insurance to [facility] [phone]).  Fax confirmation received.

## 2023-09-27 DIAGNOSIS — Z992 Dependence on renal dialysis: Secondary | ICD-10-CM | POA: Diagnosis not present

## 2023-09-27 DIAGNOSIS — D509 Iron deficiency anemia, unspecified: Secondary | ICD-10-CM | POA: Diagnosis not present

## 2023-09-27 DIAGNOSIS — N2581 Secondary hyperparathyroidism of renal origin: Secondary | ICD-10-CM | POA: Diagnosis not present

## 2023-09-27 DIAGNOSIS — N186 End stage renal disease: Secondary | ICD-10-CM | POA: Diagnosis not present

## 2023-09-29 DIAGNOSIS — N186 End stage renal disease: Secondary | ICD-10-CM | POA: Diagnosis not present

## 2023-09-29 DIAGNOSIS — B952 Enterococcus as the cause of diseases classified elsewhere: Secondary | ICD-10-CM | POA: Diagnosis not present

## 2023-09-29 DIAGNOSIS — J440 Chronic obstructive pulmonary disease with acute lower respiratory infection: Secondary | ICD-10-CM | POA: Diagnosis not present

## 2023-09-29 DIAGNOSIS — I120 Hypertensive chronic kidney disease with stage 5 chronic kidney disease or end stage renal disease: Secondary | ICD-10-CM | POA: Diagnosis not present

## 2023-09-29 DIAGNOSIS — D631 Anemia in chronic kidney disease: Secondary | ICD-10-CM | POA: Diagnosis not present

## 2023-09-29 DIAGNOSIS — J151 Pneumonia due to Pseudomonas: Secondary | ICD-10-CM | POA: Diagnosis not present

## 2023-09-30 DIAGNOSIS — B952 Enterococcus as the cause of diseases classified elsewhere: Secondary | ICD-10-CM | POA: Diagnosis not present

## 2023-09-30 DIAGNOSIS — J440 Chronic obstructive pulmonary disease with acute lower respiratory infection: Secondary | ICD-10-CM | POA: Diagnosis not present

## 2023-09-30 DIAGNOSIS — J151 Pneumonia due to Pseudomonas: Secondary | ICD-10-CM | POA: Diagnosis not present

## 2023-09-30 DIAGNOSIS — D631 Anemia in chronic kidney disease: Secondary | ICD-10-CM | POA: Diagnosis not present

## 2023-09-30 DIAGNOSIS — N186 End stage renal disease: Secondary | ICD-10-CM | POA: Diagnosis not present

## 2023-09-30 DIAGNOSIS — N2581 Secondary hyperparathyroidism of renal origin: Secondary | ICD-10-CM | POA: Diagnosis not present

## 2023-09-30 DIAGNOSIS — D509 Iron deficiency anemia, unspecified: Secondary | ICD-10-CM | POA: Diagnosis not present

## 2023-09-30 DIAGNOSIS — Z992 Dependence on renal dialysis: Secondary | ICD-10-CM | POA: Diagnosis not present

## 2023-09-30 DIAGNOSIS — I120 Hypertensive chronic kidney disease with stage 5 chronic kidney disease or end stage renal disease: Secondary | ICD-10-CM | POA: Diagnosis not present

## 2023-10-01 ENCOUNTER — Institutional Professional Consult (permissible substitution): Payer: Medicare Other | Admitting: Student in an Organized Health Care Education/Training Program

## 2023-10-02 DIAGNOSIS — Z992 Dependence on renal dialysis: Secondary | ICD-10-CM | POA: Diagnosis not present

## 2023-10-02 DIAGNOSIS — D509 Iron deficiency anemia, unspecified: Secondary | ICD-10-CM | POA: Diagnosis not present

## 2023-10-02 DIAGNOSIS — N2581 Secondary hyperparathyroidism of renal origin: Secondary | ICD-10-CM | POA: Diagnosis not present

## 2023-10-02 DIAGNOSIS — N186 End stage renal disease: Secondary | ICD-10-CM | POA: Diagnosis not present

## 2023-10-03 ENCOUNTER — Ambulatory Visit (INDEPENDENT_AMBULATORY_CARE_PROVIDER_SITE_OTHER): Payer: Medicare Other | Admitting: Podiatry

## 2023-10-03 ENCOUNTER — Encounter: Payer: Self-pay | Admitting: Family Medicine

## 2023-10-03 ENCOUNTER — Encounter: Payer: Self-pay | Admitting: Podiatry

## 2023-10-03 DIAGNOSIS — B351 Tinea unguium: Secondary | ICD-10-CM | POA: Diagnosis not present

## 2023-10-03 DIAGNOSIS — D631 Anemia in chronic kidney disease: Secondary | ICD-10-CM | POA: Diagnosis not present

## 2023-10-03 DIAGNOSIS — D689 Coagulation defect, unspecified: Secondary | ICD-10-CM

## 2023-10-03 DIAGNOSIS — J151 Pneumonia due to Pseudomonas: Secondary | ICD-10-CM | POA: Diagnosis not present

## 2023-10-03 DIAGNOSIS — I120 Hypertensive chronic kidney disease with stage 5 chronic kidney disease or end stage renal disease: Secondary | ICD-10-CM | POA: Diagnosis not present

## 2023-10-03 DIAGNOSIS — N186 End stage renal disease: Secondary | ICD-10-CM | POA: Diagnosis not present

## 2023-10-03 DIAGNOSIS — J440 Chronic obstructive pulmonary disease with acute lower respiratory infection: Secondary | ICD-10-CM | POA: Diagnosis not present

## 2023-10-03 DIAGNOSIS — L84 Corns and callosities: Secondary | ICD-10-CM | POA: Diagnosis not present

## 2023-10-03 DIAGNOSIS — B952 Enterococcus as the cause of diseases classified elsewhere: Secondary | ICD-10-CM | POA: Diagnosis not present

## 2023-10-04 DIAGNOSIS — N2581 Secondary hyperparathyroidism of renal origin: Secondary | ICD-10-CM | POA: Diagnosis not present

## 2023-10-04 DIAGNOSIS — Z992 Dependence on renal dialysis: Secondary | ICD-10-CM | POA: Diagnosis not present

## 2023-10-04 DIAGNOSIS — D509 Iron deficiency anemia, unspecified: Secondary | ICD-10-CM | POA: Diagnosis not present

## 2023-10-04 DIAGNOSIS — N186 End stage renal disease: Secondary | ICD-10-CM | POA: Diagnosis not present

## 2023-10-06 DIAGNOSIS — J151 Pneumonia due to Pseudomonas: Secondary | ICD-10-CM | POA: Diagnosis not present

## 2023-10-06 DIAGNOSIS — D509 Iron deficiency anemia, unspecified: Secondary | ICD-10-CM | POA: Diagnosis not present

## 2023-10-06 DIAGNOSIS — J440 Chronic obstructive pulmonary disease with acute lower respiratory infection: Secondary | ICD-10-CM | POA: Diagnosis not present

## 2023-10-06 DIAGNOSIS — N186 End stage renal disease: Secondary | ICD-10-CM | POA: Diagnosis not present

## 2023-10-06 DIAGNOSIS — N2581 Secondary hyperparathyroidism of renal origin: Secondary | ICD-10-CM | POA: Diagnosis not present

## 2023-10-06 DIAGNOSIS — I120 Hypertensive chronic kidney disease with stage 5 chronic kidney disease or end stage renal disease: Secondary | ICD-10-CM | POA: Diagnosis not present

## 2023-10-06 DIAGNOSIS — Z992 Dependence on renal dialysis: Secondary | ICD-10-CM | POA: Diagnosis not present

## 2023-10-06 DIAGNOSIS — D631 Anemia in chronic kidney disease: Secondary | ICD-10-CM | POA: Diagnosis not present

## 2023-10-06 DIAGNOSIS — B952 Enterococcus as the cause of diseases classified elsewhere: Secondary | ICD-10-CM | POA: Diagnosis not present

## 2023-10-06 NOTE — Progress Notes (Signed)
Subjective:  Patient ID: Jeremy [name], male    DOB: [birth year],  MRN: [id number]  Jeremy [name] presents to clinic today for:  Chief Complaint  Patient presents with   Callouses    PATIENT NEEDS HIS NAILS CUT AND CALLOUSES CUT AND LOOKED AT PATIENT STATES , PATIENT STATES IT HAS BEEN A FEW MONTHS.NO MEDICATION FOR PAIN    He also presents with toenail problem requesting to have them trimmed.  Patient notes nails are thick and elongated, causing pain in shoe gear when ambulating.  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 On home oxygen.  PCP is Jeremy Coombe, DO.  Past Medical History:  Diagnosis Date   Anemia    Cancer [facility])    patient and family have not been told that he has cancer.   Chronic kidney disease    dialysis T/Th/Sa   COPD (chronic obstructive pulmonary disease) (HCC)    Enlarged prostate    History of blood transfusion    Hyperlipidemia    Hypertension    Self-catheterizes urinary bladder    pt. does this morning and evening--been doing this since 11/2016   Sleep apnea    not using a cpap machine    Allergies  Allergen Reactions   Sulfa Antibiotics     Rash, light headed, shaking   Penicillins Other (See Comments)    hives severe as a child    Tape     Pulls skin off    Objective:  There were no vitals filed for this visit.  Jeremy [name] is a pleasant 82 y.o. male in NAD. AAO x 3.  Vascular Examination: Patient has palpable DP pulse, palpable PT pulse bilateral.  Delayed capillary refill bilateral toes.  Sparse digital hair bilateral.  Proximal to distal cooling WNL bilateral.  +1 to +2 pitting edema present  Dermatological Examination: Interspaces are clear with no open lesions noted bilateral.  Skin is shiny and atrophic bilateral.  Nails are 3-35mm thick, with yellowish/brown discoloration, subungual debris and distal onycholysis x10.  There is pain with compression of nails x10.  There are hyperkeratotic  lesions noted to the right foot subfourth metatarsal head region that is noncompressible, tender on palpation.  Neurological Examination: Protective sensation intact b/l LE. Vibratory sensation intact b/l LE.  Musculoskeletal Examination: Muscle strength 5/5 to all LE muscle groups b/l.      Latest Ref Rng & Units 08/10/2023   11:26 PM  Hemoglobin A1C  Hemoglobin-A1c 4.8 - 5.6 % 5.5      Patient qualifies for at-risk foot care because of onychomycosis, coagulation disorder.  Assessment/Plan: 1. Onychomycosis   2. Clotting disorder (HCC)   3. Callus of foot     No orders of the defined types were placed in this encounter.  None  Mycotic nails x10 were sharply debrided with sterile nail nippers and power debriding burr to decrease bulk and length.  Hyperkeratotic lesions were shaved with #312 blade.  -Instructed patient to monitor feet daily for any hotspots.   Return in about 3 months (around 01/03/2024) for Routine Foot Care.   Clerance Lav, DPM, FACFAS [facility]     [address]                                        [place],  [place] [address]                Office [phone]  Fax [phone]

## 2023-10-07 ENCOUNTER — Telehealth: Payer: Self-pay | Admitting: Primary Care

## 2023-10-07 ENCOUNTER — Encounter: Payer: Self-pay | Admitting: Primary Care

## 2023-10-07 ENCOUNTER — Ambulatory Visit: Payer: Medicare Other | Admitting: Primary Care

## 2023-10-07 VITALS — BP 114/66 | HR 75 | Temp 97.8°F | Ht 70.0 in | Wt 151.6 lb

## 2023-10-07 DIAGNOSIS — J151 Pneumonia due to Pseudomonas: Secondary | ICD-10-CM | POA: Diagnosis not present

## 2023-10-07 DIAGNOSIS — J9611 Chronic respiratory failure with hypoxia: Secondary | ICD-10-CM | POA: Diagnosis not present

## 2023-10-07 DIAGNOSIS — J9622 Acute and chronic respiratory failure with hypercapnia: Secondary | ICD-10-CM

## 2023-10-07 DIAGNOSIS — B952 Enterococcus as the cause of diseases classified elsewhere: Secondary | ICD-10-CM | POA: Diagnosis not present

## 2023-10-07 DIAGNOSIS — N186 End stage renal disease: Secondary | ICD-10-CM | POA: Diagnosis not present

## 2023-10-07 DIAGNOSIS — I120 Hypertensive chronic kidney disease with stage 5 chronic kidney disease or end stage renal disease: Secondary | ICD-10-CM | POA: Diagnosis not present

## 2023-10-07 DIAGNOSIS — D631 Anemia in chronic kidney disease: Secondary | ICD-10-CM | POA: Diagnosis not present

## 2023-10-07 DIAGNOSIS — G4733 Obstructive sleep apnea (adult) (pediatric): Secondary | ICD-10-CM

## 2023-10-07 DIAGNOSIS — J440 Chronic obstructive pulmonary disease with acute lower respiratory infection: Secondary | ICD-10-CM | POA: Diagnosis not present

## 2023-10-07 DIAGNOSIS — J410 Simple chronic bronchitis: Secondary | ICD-10-CM | POA: Diagnosis not present

## 2023-10-07 MED ORDER — REVEFENACIN 175 MCG/3ML IN SOLN: 175.0000 ug | Freq: Every day | RESPIRATORY_TRACT | 3 refills | Status: DC

## 2023-10-07 MED ORDER — BUDESONIDE 0.5 MG/2ML IN SUSP: 0.5000 mg | Freq: Two times a day (BID) | RESPIRATORY_TRACT | 3 refills | Status: DC

## 2023-10-07 MED ORDER — ARFORMOTEROL TARTRATE 15 MCG/2ML IN NEBU: 15.0000 ug | INHALATION_SOLUTION | Freq: Two times a day (BID) | RESPIRATORY_TRACT | 3 refills | Status: DC

## 2023-10-07 MED ORDER — IPRATROPIUM BROMIDE 0.03 % NA SOLN: 2.0000 | Freq: Two times a day (BID) | NASAL | 2 refills | Status: DC

## 2023-10-07 NOTE — Telephone Encounter (Signed)
Patient calling in bc his wife is wanting patient meds TODAY

## 2023-10-07 NOTE — Progress Notes (Signed)
@Patient  ID: Jeremy [name], male    DOB: 14-Aug-1941, 82 y.o.   MRN: [id number]  Chief Complaint  Patient presents with   Follow-up    Referring provider: Everrett Coombe, DO  HPI:  82 year old male,  PMH significant for HTN, chronic bronchitis, chronic respiratory failure, OSA intolerant to CPAP, ESRD. Patient of Dr. Wynona Neat, last seen on 12/21/20. Smoking cessation encouraged. Referred for oral appliance   10/07/2023 Discussed the use of AI scribe software for clinical note transcription with the patient, who gave verbal consent to proceed.  Patient was hospitalized in October for COPD exacerbation.  Noted to have hypoxia with O2 sats at 50%.  Initially treated with BiPAP and eventually required intubation.  Diagnosed with sepsis secondary to urinary versus pulmonary source.  He required pressors during admission.  He received antibiotics and was discharged to skilled nursing facility.  Instructed to follow dysphagia due to aspiration concerns on swallow study.  He remains on oxygen, O2 93-94% on 3L. Needs POC. CXR completed on 11/11, results are still pending. He is on HD, followed by nephrology  History of Present Illness   Patient presents today for hospital follow-up due to aspiration pneumonia.The patient, previously diagnosed with sleep apnea, was admitted in October for respiratory failure, urinary infection, and possible pneumonia. Post-discharge, the patient was sent to a skilled nursing facility on oxygen due to concerns of aspiration, confirmed by a swallow study. The patient had previously tried CPAP for sleep apnea but did not tolerate it well. An oral appliance was recommended and used, but the patient reported no significant improvement.  The patient has lost about fifteen to twenty pounds and quit smoking two months ago. Despite these lifestyle changes, the patient continues to experience symptoms of sleep apnea, including frequent daytime sleepiness. The patient also reports a  dry mouth, which is exacerbated by the use of a CPAP mask.  In addition to sleep apnea, the patient has chronic bronchitis, presumed to be COPD. The patient is on nebulizers (Brovana, Pulmicort, and Yupelri) for this condition. The patient also reports nasal congestion and postnasal drip, which is managed with saline rinses and nasal sprays (ipratropium and Flonase).  The patient has been using oxygen at home for over a month. However, there are concerns about the patient's oxygen levels dropping into the eighties when not on oxygen. The patient also reports coughing up a significant amount of mucus.  The patient has a history of skin issues, with a recent visit to a dermatologist for removal of skin lesions. The patient also reports a history of difficulty swallowing, requiring a honey-thick liquid diet.       Allergies  Allergen Reactions   Sulfa Antibiotics     Rash, light headed, shaking   Penicillins Other (See Comments)    hives severe as a child    Tape     Pulls skin off    Immunization History  Administered Date(s) Administered   Fluad Quad(high Dose 65+) 07/06/2019, 08/30/2022   Hepb-cpg 06/26/2023, 08/05/2023, 08/28/2023   Influenza Split 10/01/2011, 09/12/2012, 08/11/2013, 08/07/2014, 11/11/2017   Influenza Whole 09/11/2009, 09/02/2010   Influenza, High Dose Seasonal PF 08/09/2013, 08/22/2017, 08/18/2018   Influenza,inj,Quad PF,6+ Mos 07/30/2013, 09/12/2015, 10/11/2016   Influenza,trivalent, recombinat, inj, PF 09/02/2023   Influenza-Unspecified 08/24/2017, 09/08/2020   PFIZER(Purple Top)SARS-COV-2 Vaccination 11/19/2019, 12/10/2019, 07/18/2020, 10/18/2022   Pfizer(Comirnaty)Fall Seasonal Vaccine 12 years and older 10/18/2022   Pneumococcal Conjugate-13 01/26/2014   Pneumococcal Polysaccharide-23 10/10/2008   Td 01/15/2010   Tdap 07/03/2021  Zoster, Live 02/09/2014    Past Medical History:  Diagnosis Date   Anemia    Cancer (HCC)    patient and family have not  been told that he has cancer.   Chronic kidney disease    dialysis T/Th/Sa   COPD (chronic obstructive pulmonary disease) (HCC)    Enlarged prostate    History of blood transfusion    Hyperlipidemia    Hypertension    Self-catheterizes urinary bladder    pt. does this morning and evening--been doing this since 11/2016   Sleep apnea    not using a cpap machine    Tobacco History: Social History   Tobacco Use  Smoking Status Former   Current packs/day: 0.00   Average packs/day: 0.2 packs/day for 61.5 years (12.3 ttl pk-yrs)   Types: Cigarettes   Start date: 12/29/1961   Quit date: 07/2023   Years since quitting: 0.2   Passive exposure: Never  Smokeless Tobacco Never  Tobacco Comments   Smokes 1/2 pack a day    Pt states he quit smoking 2 months ago. AB, CMA 10-07-23   Counseling given: Not Answered Tobacco comments: Smokes 1/2 pack a day  Pt states he quit smoking 2 months ago. AB, CMA 10-07-23   Outpatient Medications Prior to Visit  Medication Sig Dispense Refill   AMBULATORY NON FORMULARY MEDICATION Please provide portable O2 concentrator for patient. Flow 2L/min, 24/7 use.  J96.10-Chronic respiratory failure. 1 Device 0   AMBULATORY NON FORMULARY MEDICATION Please provide portable oxygen concentrator.  Flow 3L/min.  Dx: Chronic respiratory failure J96.11, COPD J42 1 Device 0   arformoterol (BROVANA) 15 MCG/2ML NEBU Take 2 mLs (15 mcg total) by nebulization 2 (two) times daily. 120 mL 0   budesonide (PULMICORT) 0.5 MG/2ML nebulizer solution Take 2 mLs (0.5 mg total) by nebulization 2 (two) times daily. 50 mL 12   cholecalciferol (VITAMIN D3) 25 MCG (1000 UNIT) tablet Take 1,000 Units by mouth daily.     docusate (COLACE) 50 MG/5ML liquid Take 10 mLs (100 mg total) by mouth 2 (two) times daily. 100 mL 0   guaiFENesin (ROBITUSSIN) 100 MG/5ML liquid Take 20 mLs by mouth 2 (two) times daily. 120 mL 0   metoprolol tartrate (LOPRESSOR) 25 MG tablet Take 1 tablet (25 mg total) by  mouth 2 (two) times daily. 60 tablet 0   multivitamin (RENA-VIT) TABS tablet Take 1 tablet by mouth at bedtime. 30 tablet 0   nicotine (NICODERM CQ - DOSED IN MG/24 HOURS) 14 mg/24hr patch Place 1 patch (14 mg total) onto the skin daily. 28 patch 0   polyethylene glycol (MIRALAX / GLYCOLAX) 17 g packet Take 17 g by mouth daily. 14 each 0   revefenacin (YUPELRI) 175 MCG/3ML nebulizer solution Take 3 mLs (175 mcg total) by nebulization daily. 90 mL 0   sevelamer carbonate (RENVELA) 800 MG tablet Take 800-2,400 mg by mouth See admin instructions. Take 2400 mg with each meal and [id number] mg with each snack     No facility-administered medications prior to visit.   Review of Systems  Review of Systems  Constitutional: Negative.   HENT:  Positive for congestion and trouble swallowing.   Respiratory:  Positive for cough.   Cardiovascular: Negative.    Physical Exam  BP 114/66 (BP Location: Right Arm, Patient Position: Sitting, Cuff Size: Normal)   Pulse 75   Temp 97.8 F (36.6 C) (Oral)   Ht 5\' 10"  (1.778 m)   Wt 151 lb 9.6 oz (68.8  kg)   SpO2 95%   BMI 21.75 kg/m  Physical Exam Constitutional:      General: He is not in acute distress.    Appearance: Normal appearance. He is not ill-appearing.  HENT:     Head: Normocephalic and atraumatic.  Cardiovascular:     Rate and Rhythm: Normal rate and regular rhythm.  Pulmonary:     Effort: Pulmonary effort is normal.     Breath sounds: Normal breath sounds. No wheezing.     Comments: Upper airway congestion/cough  Musculoskeletal:        General: Normal range of motion.  Skin:    General: Skin is warm and dry.  Neurological:     General: No focal deficit present.     Mental Status: He is alert and oriented to person, place, and time. Mental status is at baseline.  Psychiatric:        Mood and Affect: Mood normal.        Behavior: Behavior normal.        Thought Content: Thought content normal.        Judgment: Judgment normal.       Lab Results:  CBC    Component Value Date/Time   WBC 12.1 (H) 08/19/2023 2131   RBC 4.09 (L) 08/19/2023 2131   HGB 13.3 08/19/2023 2131   HCT 40.3 08/19/2023 2131   PLT 313 08/19/2023 2131   MCV 98.5 08/19/2023 2131   MCH 32.5 08/19/2023 2131   MCHC 33.0 08/19/2023 2131   RDW 14.1 08/19/2023 2131   LYMPHSABS 1.0 08/19/2023 2131   MONOABS 1.1 (H) 08/19/2023 2131   EOSABS 0.1 08/19/2023 2131   BASOSABS 0.0 08/19/2023 2131    BMET    Component Value Date/Time   NA 135 08/19/2023 2131   K 5.3 (H) 08/19/2023 2131   CL 93 (L) 08/19/2023 2131   CO2 25 08/19/2023 2131   GLUCOSE 152 (H) 08/19/2023 2131   BUN 65 (H) 08/19/2023 2131   CREATININE 4.76 (H) 08/19/2023 2131   CREATININE 3.23 (H) 04/23/2022 0000   CALCIUM 9.0 08/19/2023 2131   GFRNONAA 11 (L) 08/19/2023 2131   GFRNONAA 38 (L) 12/12/2020 0000   GFRAA 44 (L) 12/12/2020 0000    BNP    Component Value Date/Time   BNP 319.7 (H) 08/19/2023 2131    ProBNP    Component Value Date/Time   PROBNP 73.0 10/13/2009 1033    Imaging: DG SWALLOW FUNC OP MEDICARE SPEECH PATH  Result Date: 09/15/2023 Table formatting from the original result was not included. Modified Barium Swallow Study Patient Details Name: JONOTHON BRUNELLE MRN: [id number] Date of Birth: [birth year] Today's Date: 09/15/2023 HPI/PMH: HPI: Sabre Morioka is an 82 y.o. male with PMH: COPD, tobacco use, (h/o 1ppd) CKD, anemia, sleep apnea, HTN, HLD, dysphagia, ESRD on HD. He was hospitalized in October for somnolence at home and found to have low oxygen saturations in the 50%'s. He required intubation 9/29-10/1; CXR 9/29 showed right LL atelectasis or PNA. MBS completed on 10/2 reported moderate oropharyngeal dysphagia with aspiration or thin liquids, deep penetration with nectar thick and honey thick liquids. He was discharged to SNF on 10/8 and recently discharged home. He presented to this OP MBS to determine if his swallow function had improved enough. Patient  really wants to be able to have regular food. Clinical Impression: Clinical Impression: Josiahs Sekhon continues to present with a moderately impaired pharyngeal phase dysphagia with aspiration of thin and nectar thick liquids as well as  pharyngeal retention of solids and thicker liquids (honey thick, pudding thick). When compared to previous MBS completed on 08/13/23, today's test did not appear significantly different. He continues with structural dysphagia suspected to be from curvature of cervical spine causing narrowing of pharyngeal  space and resulting in reduced epiglottic inversion. He exhibits reduced base of tongue strength resulting in pharyngeal residuals (moderate in amount with honey thick, puree and soft solids) primarily at level of vallecular sinus. Anterior hyoid excursion, laryngeal elevation and epiglottic inversion were all partial in completion. He was observed to have to have consistent, silent aspiration of gross amount of barium with thin liquids slightly smaller amount of silent aspiration with nectar thick liquids. Cued cough and reswallow did help to move some nectar thick residuals out of laryngeal vestibule. No significant difference between bolus transit and residuals with fully masticated solids (graham cracker) and puree solids. After MBS, SLP spent time discussing results and recommendations with Mr. Lorenzini, his hired caregiver and his spouse. Mr. Lauriano wants to be able to have regular foods and to be able to go to a restaurants. His wife reports she has been strictly adhering to recommendation of minced solids and honey thick liquids. SLP advised Mr. Starkman and spouse that he remains at a high aspiration risk with thin liquids and nectar thick liquids but lower risk of aspiration with honey thick liquids, puree solids and soft solids. SLP recommending he and spouse cautiously and carefully introduce some advanced solids (mechanical soft approximately) but to continue with honey thick  liquids. SLP also advised them to avoid mixed consistency solids as they reintroduce advanced solids into his diet. SLP is recommending OP SLP services to continue with dysphagia management and education. Factors that may increase risk of adverse event in presence of aspiration Rubye Oaks & Clearance Coots 2021): Factors that may increase risk of adverse event in presence of aspiration Rubye Oaks & Clearance Coots 2021): Limited mobility; Frail or deconditioned; Weak cough; Frequent aspiration of large volumes; Poor general health and/or compromised immunity Recommendations/Plan: Swallowing Evaluation Recommendations Swallowing Evaluation Recommendations Recommendations: PO diet Liquid Administration via: Cup; Spoon Medication Administration: Whole meds with puree Supervision: Patient able to self-feed Swallowing strategies  : Slow rate; Small bites/sips; Avoid mixed consistencies; Multiple dry swallows after each bite/sip; Clear throat intermittently; Hard cough after swallowing Postural changes: Position pt fully upright for meals Caregiver Recommendations: Avoid jello, ice cream, thin soups, popsicles Treatment Plan Treatment Plan Follow-up recommendations: Outpatient SLP Recommendations Recommendations for follow up therapy are one component of a multi-disciplinary discharge planning process, led by the attending physician.  Recommendations may be updated based on patient status, additional functional criteria and insurance authorization. Assessment: Orofacial Exam: Orofacial Exam Oral Cavity: Oral Hygiene: WFL Orofacial Anatomy: WFL Oral Motor/Sensory Function: WFL Anatomy: No data recorded Boluses Administered: Boluses Administered Boluses Administered: Thin liquids (Level 0); Mildly thick liquids (Level 2, nectar thick); Moderately thick liquids (Level 3, honey thick); Puree; Solid  Oral Impairment Domain: Oral Impairment Domain Lip Closure: No labial escape Tongue control during bolus hold: Cohesive bolus between tongue to  palatal seal Bolus preparation/mastication: Timely and efficient chewing and mashing Bolus transport/lingual motion: Brisk tongue motion Oral residue: Complete oral clearance Location of oral residue : N/A Initiation of pharyngeal swallow : Valleculae; Posterior laryngeal surface of the epiglottis  Pharyngeal Impairment Domain: Pharyngeal Impairment Domain Soft palate elevation: No bolus between soft palate (SP)/pharyngeal wall (PW) Laryngeal elevation: Partial superior movement of thyroid cartilage/partial approximation of arytenoids to epiglottic petiole Anterior hyoid excursion: Partial anterior  movement Epiglottic movement: Partial inversion Laryngeal vestibule closure: Incomplete, narrow column air/contrast in laryngeal vestibule Pharyngeal contraction (A/P view only): N/A Pharyngoesophageal segment opening: Complete distension and complete duration, no obstruction of flow Tongue base retraction: Trace column of contrast or air between tongue base and PPW Pharyngeal residue: Collection of residue within or on pharyngeal structures Location of pharyngeal residue: Valleculae; Pyriform sinuses; Pharyngeal wall  Esophageal Impairment Domain: Esophageal Impairment Domain Esophageal clearance upright position: Complete clearance, esophageal coating Pill: No data recorded Penetration/Aspiration Scale Score: Penetration/Aspiration Scale Score 1.  Material does not enter airway: Solid; Puree 2.  Material enters airway, remains ABOVE vocal cords then ejected out: Moderately thick liquids (Level 3, honey thick) 8.  Material enters airway, passes BELOW cords without attempt by patient to eject out (silent aspiration) : Thin liquids (Level 0); Mildly thick liquids (Level 2, nectar thick) Compensatory Strategies: Compensatory Strategies Compensatory strategies: Yes Chin tuck: Ineffective   General Information: Caregiver present: Yes  Diet Prior to this Study: Dysphagia 2 (finely chopped); Moderately thick liquids (Level 3,  honey thick)   No data recorded  Respiratory Status: WFL   Supplemental O2: Nasal cannula   No data recorded Behavior/Cognition: Alert; Cooperative; Pleasant mood Self-Feeding Abilities: Able to self-feed Baseline vocal quality/speech: Normal Volitional Cough: Able to elicit Volitional Swallow: Able to elicit Exam Limitations: No limitations Goal Planning: No data recorded No data recorded No data recorded Patient/Family Stated Goal: Patient wants to eat regular food Consulted and agree with results and recommendations: Patient; Family member/caregiver Pain: Pain Assessment Pain Assessment: No/denies pain End of Session: Start Time:SLP Start Time (ACUTE ONLY): 1305 Stop Time: SLP Stop Time (ACUTE ONLY): 1345 Time Calculation:SLP Time Calculation (min) (ACUTE ONLY): 40 min Charges: SLP Evaluations $ SLP Speech Visit: 1 Visit SLP Evaluations $Outpatient MBS Swallow: 1 Procedure SLP visit diagnosis: SLP Visit Diagnosis: Dysphagia, oropharyngeal phase (R13.12) Past Medical History: Past Medical History: Diagnosis Date  Anemia   Cancer (HCC)   patient and family have not been told that he has cancer.  Chronic kidney disease   dialysis T/Th/Sa  COPD (chronic obstructive pulmonary disease) (HCC)   Enlarged prostate   History of blood transfusion   Hyperlipidemia   Hypertension   Self-catheterizes urinary bladder   pt. 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  TONSILLECTOMY AND ADENOIDECTOMY    Surg as a child...  TRANSURETHRAL RESECTION OF BLADDER TUMOR N/A 01/03/2022  Procedure: TRANSURETHRAL RESECTION OF BLADDER TUMOR (TURBT)/  CYSTOSCOPY/ EXAM UNDER ANESTHESIA, BILATERAL RETROGRADE/ BLADDER BIOPSIES;  Surgeon: Heloise Purpura, MD;  Location: [facility] ORS;  Service: Urology;  Laterality: N/A;  GENERAL ANESTHESIA WITH PARALYSIS Angela Nevin, MA, CCC-SLP Speech Therapy CLINICAL DATA:  Dysphagia. Cough/GE reflux disease/other secondary diagnosis EXAM: MODIFIED BARIUM SWALLOW TECHNIQUE: Radiologist, in attendance for the exam. Different consistencies of barium were administered orally to the patient by the Speech Pathologist. Imaging of the pharynx was performed in the lateral projection. Cine imaging was performed. The radiologist was present in the fluoroscopy room for this study, providing personal supervision. FLUOROSCOPY: Radiation Exposure Index (as provided by the fluoroscopic device): 11.9 mGy Kerma COMPARISON:  None Available. FINDINGS: Vestibular  Penetration:  Present within and nectar thick liquids. Aspiration:  Present with thin and nectar thick liquids. Other: Premature spill and retention seen with all tested modalities. IMPRESSION: Silent aspiration with thin and nectar thick liquids. Please refer to the Speech Pathologists report for complete details and recommendations. Electronically Signed   By: Leanna Battles M.D.    On: 09/15/2023 13:44    Assessment & Plan:   1. OSA (obstructive sleep apnea) - Cpap titration; Future  2. Chronic respiratory failure with hypoxia (HCC) - Cpap titration; Future - Pulmonary function test; Future - AMB REFERRAL FOR DME - AMB referral to pulmonary rehabilitation - AMB REFERRAL FOR DME  3. Simple chronic bronchitis (HCC) - AMB REFERRAL FOR DME     Obstructive Sleep Apnea Previously intolerant to CPAP and tried oral appliance without noticeable improvement. Patient has lost weight and quit smoking since last sleep study in 2018. He is open to re-trying CPAP.  -Order CPAP titration study to reassess pressure settings and mask fit.  Chronic Bronchitis (presumed COPD) Patient on Brovana, Pulmicort, and Yupelri nebulizers with recent weight loss and smoking cessation. -Continue current nebulizer regimen / Nebulizer prescription sent to [facility]  -Order spirometry to assess lung function and diffusion capacity. -Refer to pulmonary rehab.  Nasal Congestion/Postnasal Drip Patient reports frequent congestion and postnasal drip, contributing to mouth breathing and dry mouth. -Prescribe ipratropium nasal spray twice daily and Flonase once daily in the morning. -Continue saline nasal rinses as needed for congestion.  Oxygen Requirement Patient currently on oxygen therapy, with recent weight loss and smoking cessation. -Check oxygen levels on 1 liter and during ambulation on portable concentrator. -Refer to respiratory care services for potential adjustment of oxygen therapy.  Saturation Qualifications: (This note is used to comply with regulatory documentation for POC)   Patient Saturations on Room Air at Rest= 96%   Patient Saturations on 2L POC while Ambulating= 96%    Glenford Bayley, NP 10/07/2023

## 2023-10-07 NOTE — Patient Instructions (Signed)
-  OBSTRUCTIVE SLEEP APNEA: Obstructive sleep apnea is a condition where your airway becomes blocked during sleep, causing breathing pauses. Since you have lost weight and quit smoking, we will order a CPAP titration study to reassess the pressure settings and mask fit to improve your treatment.  -CHRONIC BRONCHITIS (PRESUMED COPD): Chronic bronchitis, often associated with COPD, is a long-term inflammation of the airways. You will continue your current nebulizer regimen (Brovana, Pulmicort, and Yupelri). We will also order spirometry to assess your lung function and refer you to pulmonary rehab to help manage your condition.  -NASAL CONGESTION/POSTNASAL DRIP: Nasal congestion and postnasal drip can cause mouth breathing and dry mouth. We have prescribed ipratropium nasal spray to be used twice daily and Flonase once daily in the morning. Continue using saline nasal rinses as needed.  -OXYGEN REQUIREMENT: You are currently on oxygen therapy due to low oxygen levels. We will check your oxygen levels on 1 liter and during ambulation with a portable concentrator. You will be referred to respiratory care services to potentially adjust your oxygen therapy.  -GENERAL HEALTH MAINTENANCE: Continue with your smoking cessation and weight management efforts, as these are crucial for your overall health.  INSTRUCTIONS: We have ordered a CPAP titration study to reassess your sleep apnea treatment. Spirometry will be conducted to evaluate your lung function, and you will be referred to pulmonary rehab. Additionally, we will check your oxygen levels and refer you to respiratory care services for potential adjustments to your oxygen therapy. Please continue with your prescribed medications and follow the new nasal spray regimen.  Orders: CPAP titration study Pulmonary function testing Refer pulmonary rehab  Qualify for POC  Renew Nebulizer supplies   Rx: Renew nebulizer Add Atrovent and fluticasone nasal spray  as directed    Follow-up 3 months with Dr. Wynona Neat or Waynetta Sandy NP (30 min PFT prior)

## 2023-10-08 ENCOUNTER — Telehealth (HOSPITAL_COMMUNITY): Payer: Self-pay

## 2023-10-08 ENCOUNTER — Telehealth: Payer: Self-pay | Admitting: Primary Care

## 2023-10-08 DIAGNOSIS — D509 Iron deficiency anemia, unspecified: Secondary | ICD-10-CM | POA: Diagnosis not present

## 2023-10-08 DIAGNOSIS — N2581 Secondary hyperparathyroidism of renal origin: Secondary | ICD-10-CM | POA: Diagnosis not present

## 2023-10-08 DIAGNOSIS — N186 End stage renal disease: Secondary | ICD-10-CM | POA: Diagnosis not present

## 2023-10-08 DIAGNOSIS — Z992 Dependence on renal dialysis: Secondary | ICD-10-CM | POA: Diagnosis not present

## 2023-10-08 MED ORDER — REVEFENACIN 175 MCG/3ML IN SOLN: 175.0000 ug | Freq: Every day | RESPIRATORY_TRACT | 3 refills | Status: DC

## 2023-10-08 MED ORDER — ARFORMOTEROL TARTRATE 15 MCG/2ML IN NEBU: 15.0000 ug | INHALATION_SOLUTION | Freq: Two times a day (BID) | RESPIRATORY_TRACT | 3 refills | Status: DC

## 2023-10-08 MED ORDER — BUDESONIDE 0.5 MG/2ML IN SUSP: 0.5000 mg | Freq: Two times a day (BID) | RESPIRATORY_TRACT | 3 refills | Status: DC

## 2023-10-08 NOTE — Telephone Encounter (Signed)
I sent to mail in pharmacy cause they can provide the medication cheaper and do prior auth or coverage if needed. When I went to send nebulizer to [facility] price was expensive but I can send to them today

## 2023-10-08 NOTE — Telephone Encounter (Signed)
Thanks to Ms. Clent Ridges, NFN

## 2023-10-08 NOTE — Telephone Encounter (Signed)
PT states she will pay this time and do the mail order when they are not out. Can we call in to [facility] right away because she only has her aide for so many hours today.  Also,  [facility] calling stating they rec'd the referral for this PT.Please send referral for cardiac and pulm rehab signed by a Dr. Not an NP, they say.

## 2023-10-08 NOTE — Telephone Encounter (Signed)
Called [facility] in reference to Pulmonary rehab referral not being signed my an MD.Spoke with Louis Matte who was sending a message for the referral to be placed with MD signature instead of an NP.

## 2023-10-08 NOTE — Telephone Encounter (Signed)
PT wife and caretaker calling again today because at appt Ms. Clent Ridges said we would order his 3 neb medications because he was out. The order was sent to a mail order pharm they say they never heard of and he needs the medication today. Please call PT to advise action taken.[phone]  Pharm: [facility] on [address] in [place].

## 2023-10-08 NOTE — Telephone Encounter (Signed)
Can we please call [facility] to follow-up on this? Does he need prior auth

## 2023-10-08 NOTE — Telephone Encounter (Signed)
Called and spoke with [facility] and pt about neb, [facility] did state the prescription is available. Ms Farwell did verbalized understanding. Nfn

## 2023-10-08 NOTE — Telephone Encounter (Signed)
Patient's medication sent to [facility] on [address] in [place] needs approval. OnmyChart it says it was approved but [facility] says otherwise. Revesencin, Atrovent and  bunosesomide. Patient is completely out.

## 2023-10-08 NOTE — Telephone Encounter (Signed)
I already sent in prescription. Im aware they can not take an order, it was a mistake I will fix. Thanks Hughes Better

## 2023-10-08 NOTE — Telephone Encounter (Signed)
Called [facility] in reference to Pulmonary rehab referral not being signed my an MD.Spoke with Louis Matte who was sending a message for the referral to be placed with MD signature instead of an NP.

## 2023-10-08 NOTE — Telephone Encounter (Signed)
Please advise on alternative.

## 2023-10-09 DIAGNOSIS — F17210 Nicotine dependence, cigarettes, uncomplicated: Secondary | ICD-10-CM | POA: Diagnosis not present

## 2023-10-09 DIAGNOSIS — D631 Anemia in chronic kidney disease: Secondary | ICD-10-CM | POA: Diagnosis not present

## 2023-10-09 DIAGNOSIS — M48061 Spinal stenosis, lumbar region without neurogenic claudication: Secondary | ICD-10-CM | POA: Diagnosis not present

## 2023-10-09 DIAGNOSIS — Z9981 Dependence on supplemental oxygen: Secondary | ICD-10-CM | POA: Diagnosis not present

## 2023-10-09 DIAGNOSIS — Z87440 Personal history of urinary (tract) infections: Secondary | ICD-10-CM | POA: Diagnosis not present

## 2023-10-09 DIAGNOSIS — J151 Pneumonia due to Pseudomonas: Secondary | ICD-10-CM | POA: Diagnosis not present

## 2023-10-09 DIAGNOSIS — E875 Hyperkalemia: Secondary | ICD-10-CM | POA: Diagnosis not present

## 2023-10-09 DIAGNOSIS — N186 End stage renal disease: Secondary | ICD-10-CM | POA: Diagnosis not present

## 2023-10-09 DIAGNOSIS — E440 Moderate protein-calorie malnutrition: Secondary | ICD-10-CM | POA: Diagnosis not present

## 2023-10-09 DIAGNOSIS — Z466 Encounter for fitting and adjustment of urinary device: Secondary | ICD-10-CM | POA: Diagnosis not present

## 2023-10-09 DIAGNOSIS — M4696 Unspecified inflammatory spondylopathy, lumbar region: Secondary | ICD-10-CM | POA: Diagnosis not present

## 2023-10-09 DIAGNOSIS — N400 Enlarged prostate without lower urinary tract symptoms: Secondary | ICD-10-CM | POA: Diagnosis not present

## 2023-10-09 DIAGNOSIS — Z992 Dependence on renal dialysis: Secondary | ICD-10-CM | POA: Diagnosis not present

## 2023-10-09 DIAGNOSIS — I120 Hypertensive chronic kidney disease with stage 5 chronic kidney disease or end stage renal disease: Secondary | ICD-10-CM | POA: Diagnosis not present

## 2023-10-09 DIAGNOSIS — J9622 Acute and chronic respiratory failure with hypercapnia: Secondary | ICD-10-CM | POA: Diagnosis not present

## 2023-10-09 DIAGNOSIS — G9341 Metabolic encephalopathy: Secondary | ICD-10-CM | POA: Diagnosis not present

## 2023-10-09 DIAGNOSIS — G4733 Obstructive sleep apnea (adult) (pediatric): Secondary | ICD-10-CM | POA: Diagnosis not present

## 2023-10-09 DIAGNOSIS — J440 Chronic obstructive pulmonary disease with acute lower respiratory infection: Secondary | ICD-10-CM | POA: Diagnosis not present

## 2023-10-09 DIAGNOSIS — R41841 Cognitive communication deficit: Secondary | ICD-10-CM | POA: Diagnosis not present

## 2023-10-09 DIAGNOSIS — B952 Enterococcus as the cause of diseases classified elsewhere: Secondary | ICD-10-CM | POA: Diagnosis not present

## 2023-10-09 DIAGNOSIS — F5101 Primary insomnia: Secondary | ICD-10-CM | POA: Diagnosis not present

## 2023-10-09 DIAGNOSIS — J9621 Acute and chronic respiratory failure with hypoxia: Secondary | ICD-10-CM | POA: Diagnosis not present

## 2023-10-09 DIAGNOSIS — E871 Hypo-osmolality and hyponatremia: Secondary | ICD-10-CM | POA: Diagnosis not present

## 2023-10-09 DIAGNOSIS — R1310 Dysphagia, unspecified: Secondary | ICD-10-CM | POA: Diagnosis not present

## 2023-10-09 DIAGNOSIS — R339 Retention of urine, unspecified: Secondary | ICD-10-CM | POA: Diagnosis not present

## 2023-10-10 DIAGNOSIS — N186 End stage renal disease: Secondary | ICD-10-CM | POA: Diagnosis not present

## 2023-10-10 DIAGNOSIS — D631 Anemia in chronic kidney disease: Secondary | ICD-10-CM | POA: Diagnosis not present

## 2023-10-10 DIAGNOSIS — J440 Chronic obstructive pulmonary disease with acute lower respiratory infection: Secondary | ICD-10-CM | POA: Diagnosis not present

## 2023-10-10 DIAGNOSIS — B952 Enterococcus as the cause of diseases classified elsewhere: Secondary | ICD-10-CM | POA: Diagnosis not present

## 2023-10-10 DIAGNOSIS — J151 Pneumonia due to Pseudomonas: Secondary | ICD-10-CM | POA: Diagnosis not present

## 2023-10-10 DIAGNOSIS — I120 Hypertensive chronic kidney disease with stage 5 chronic kidney disease or end stage renal disease: Secondary | ICD-10-CM | POA: Diagnosis not present

## 2023-10-11 DIAGNOSIS — N186 End stage renal disease: Secondary | ICD-10-CM | POA: Diagnosis not present

## 2023-10-11 DIAGNOSIS — N2581 Secondary hyperparathyroidism of renal origin: Secondary | ICD-10-CM | POA: Diagnosis not present

## 2023-10-11 DIAGNOSIS — Z992 Dependence on renal dialysis: Secondary | ICD-10-CM | POA: Diagnosis not present

## 2023-10-11 DIAGNOSIS — D509 Iron deficiency anemia, unspecified: Secondary | ICD-10-CM | POA: Diagnosis not present

## 2023-10-12 DIAGNOSIS — N186 End stage renal disease: Secondary | ICD-10-CM | POA: Diagnosis not present

## 2023-10-12 DIAGNOSIS — I129 Hypertensive chronic kidney disease with stage 1 through stage 4 chronic kidney disease, or unspecified chronic kidney disease: Secondary | ICD-10-CM | POA: Diagnosis not present

## 2023-10-12 DIAGNOSIS — Z992 Dependence on renal dialysis: Secondary | ICD-10-CM | POA: Diagnosis not present

## 2023-10-14 DIAGNOSIS — N186 End stage renal disease: Secondary | ICD-10-CM | POA: Diagnosis not present

## 2023-10-14 DIAGNOSIS — Z992 Dependence on renal dialysis: Secondary | ICD-10-CM | POA: Diagnosis not present

## 2023-10-14 DIAGNOSIS — Z23 Encounter for immunization: Secondary | ICD-10-CM | POA: Diagnosis not present

## 2023-10-14 DIAGNOSIS — J151 Pneumonia due to Pseudomonas: Secondary | ICD-10-CM | POA: Diagnosis not present

## 2023-10-14 DIAGNOSIS — N2581 Secondary hyperparathyroidism of renal origin: Secondary | ICD-10-CM | POA: Diagnosis not present

## 2023-10-14 DIAGNOSIS — J440 Chronic obstructive pulmonary disease with acute lower respiratory infection: Secondary | ICD-10-CM | POA: Diagnosis not present

## 2023-10-14 DIAGNOSIS — I120 Hypertensive chronic kidney disease with stage 5 chronic kidney disease or end stage renal disease: Secondary | ICD-10-CM | POA: Diagnosis not present

## 2023-10-14 DIAGNOSIS — D631 Anemia in chronic kidney disease: Secondary | ICD-10-CM | POA: Diagnosis not present

## 2023-10-14 DIAGNOSIS — B952 Enterococcus as the cause of diseases classified elsewhere: Secondary | ICD-10-CM | POA: Diagnosis not present

## 2023-10-14 DIAGNOSIS — D509 Iron deficiency anemia, unspecified: Secondary | ICD-10-CM | POA: Diagnosis not present

## 2023-10-15 DIAGNOSIS — D631 Anemia in chronic kidney disease: Secondary | ICD-10-CM | POA: Diagnosis not present

## 2023-10-15 DIAGNOSIS — R8271 Bacteriuria: Secondary | ICD-10-CM | POA: Diagnosis not present

## 2023-10-15 DIAGNOSIS — N186 End stage renal disease: Secondary | ICD-10-CM | POA: Diagnosis not present

## 2023-10-15 DIAGNOSIS — B952 Enterococcus as the cause of diseases classified elsewhere: Secondary | ICD-10-CM | POA: Diagnosis not present

## 2023-10-15 DIAGNOSIS — R3914 Feeling of incomplete bladder emptying: Secondary | ICD-10-CM | POA: Diagnosis not present

## 2023-10-15 DIAGNOSIS — J151 Pneumonia due to Pseudomonas: Secondary | ICD-10-CM | POA: Diagnosis not present

## 2023-10-15 DIAGNOSIS — I120 Hypertensive chronic kidney disease with stage 5 chronic kidney disease or end stage renal disease: Secondary | ICD-10-CM | POA: Diagnosis not present

## 2023-10-15 DIAGNOSIS — J440 Chronic obstructive pulmonary disease with acute lower respiratory infection: Secondary | ICD-10-CM | POA: Diagnosis not present

## 2023-10-16 ENCOUNTER — Telehealth (HOSPITAL_COMMUNITY): Payer: Self-pay

## 2023-10-16 ENCOUNTER — Telehealth: Payer: Self-pay | Admitting: Primary Care

## 2023-10-16 DIAGNOSIS — J9611 Chronic respiratory failure with hypoxia: Secondary | ICD-10-CM

## 2023-10-16 DIAGNOSIS — N2581 Secondary hyperparathyroidism of renal origin: Secondary | ICD-10-CM | POA: Diagnosis not present

## 2023-10-16 DIAGNOSIS — D509 Iron deficiency anemia, unspecified: Secondary | ICD-10-CM | POA: Diagnosis not present

## 2023-10-16 DIAGNOSIS — Z23 Encounter for immunization: Secondary | ICD-10-CM | POA: Diagnosis not present

## 2023-10-16 DIAGNOSIS — N186 End stage renal disease: Secondary | ICD-10-CM | POA: Diagnosis not present

## 2023-10-16 DIAGNOSIS — D631 Anemia in chronic kidney disease: Secondary | ICD-10-CM | POA: Diagnosis not present

## 2023-10-16 DIAGNOSIS — Z992 Dependence on renal dialysis: Secondary | ICD-10-CM | POA: Diagnosis not present

## 2023-10-16 NOTE — Telephone Encounter (Signed)
Called patient to see if he is interested in the Pulmonary Rehab Program. Patient expressed interest but at this time will have to think about it as he has dialysis on Tuesdays and Thursdays. His wife Elease Hashimoto said they will think about it and let us know! MD Signature is still needed!

## 2023-10-16 NOTE — Telephone Encounter (Signed)
Pat wife states [facility] needs results of walking test. Pat phone number is [phone].

## 2023-10-17 DIAGNOSIS — I120 Hypertensive chronic kidney disease with stage 5 chronic kidney disease or end stage renal disease: Secondary | ICD-10-CM | POA: Diagnosis not present

## 2023-10-17 DIAGNOSIS — J440 Chronic obstructive pulmonary disease with acute lower respiratory infection: Secondary | ICD-10-CM | POA: Diagnosis not present

## 2023-10-17 DIAGNOSIS — B952 Enterococcus as the cause of diseases classified elsewhere: Secondary | ICD-10-CM | POA: Diagnosis not present

## 2023-10-17 DIAGNOSIS — N186 End stage renal disease: Secondary | ICD-10-CM | POA: Diagnosis not present

## 2023-10-17 DIAGNOSIS — J151 Pneumonia due to Pseudomonas: Secondary | ICD-10-CM | POA: Diagnosis not present

## 2023-10-17 DIAGNOSIS — D631 Anemia in chronic kidney disease: Secondary | ICD-10-CM | POA: Diagnosis not present

## 2023-10-18 DIAGNOSIS — D509 Iron deficiency anemia, unspecified: Secondary | ICD-10-CM | POA: Diagnosis not present

## 2023-10-18 DIAGNOSIS — D631 Anemia in chronic kidney disease: Secondary | ICD-10-CM | POA: Diagnosis not present

## 2023-10-18 DIAGNOSIS — N2581 Secondary hyperparathyroidism of renal origin: Secondary | ICD-10-CM | POA: Diagnosis not present

## 2023-10-18 DIAGNOSIS — N186 End stage renal disease: Secondary | ICD-10-CM | POA: Diagnosis not present

## 2023-10-18 DIAGNOSIS — Z992 Dependence on renal dialysis: Secondary | ICD-10-CM | POA: Diagnosis not present

## 2023-10-18 DIAGNOSIS — Z23 Encounter for immunization: Secondary | ICD-10-CM | POA: Diagnosis not present

## 2023-10-20 NOTE — Telephone Encounter (Signed)
Ok I will  

## 2023-10-20 NOTE — Telephone Encounter (Signed)
Pat wife checking on message for walking test. [facility] phone number is [phone]. Fax number is [phone]. Pat phone number is [phone]

## 2023-10-20 NOTE — Telephone Encounter (Signed)
The 02 walk test should be put in the providers notes

## 2023-10-21 DIAGNOSIS — Z23 Encounter for immunization: Secondary | ICD-10-CM | POA: Diagnosis not present

## 2023-10-21 DIAGNOSIS — D509 Iron deficiency anemia, unspecified: Secondary | ICD-10-CM | POA: Diagnosis not present

## 2023-10-21 DIAGNOSIS — N186 End stage renal disease: Secondary | ICD-10-CM | POA: Diagnosis not present

## 2023-10-21 DIAGNOSIS — Z992 Dependence on renal dialysis: Secondary | ICD-10-CM | POA: Diagnosis not present

## 2023-10-21 DIAGNOSIS — D631 Anemia in chronic kidney disease: Secondary | ICD-10-CM | POA: Diagnosis not present

## 2023-10-21 DIAGNOSIS — N2581 Secondary hyperparathyroidism of renal origin: Secondary | ICD-10-CM | POA: Diagnosis not present

## 2023-10-22 DIAGNOSIS — D631 Anemia in chronic kidney disease: Secondary | ICD-10-CM | POA: Diagnosis not present

## 2023-10-22 DIAGNOSIS — J151 Pneumonia due to Pseudomonas: Secondary | ICD-10-CM | POA: Diagnosis not present

## 2023-10-22 DIAGNOSIS — B952 Enterococcus as the cause of diseases classified elsewhere: Secondary | ICD-10-CM | POA: Diagnosis not present

## 2023-10-22 DIAGNOSIS — J440 Chronic obstructive pulmonary disease with acute lower respiratory infection: Secondary | ICD-10-CM | POA: Diagnosis not present

## 2023-10-22 DIAGNOSIS — N186 End stage renal disease: Secondary | ICD-10-CM | POA: Diagnosis not present

## 2023-10-22 DIAGNOSIS — I120 Hypertensive chronic kidney disease with stage 5 chronic kidney disease or end stage renal disease: Secondary | ICD-10-CM | POA: Diagnosis not present

## 2023-10-23 DIAGNOSIS — D631 Anemia in chronic kidney disease: Secondary | ICD-10-CM | POA: Diagnosis not present

## 2023-10-23 DIAGNOSIS — D509 Iron deficiency anemia, unspecified: Secondary | ICD-10-CM | POA: Diagnosis not present

## 2023-10-23 DIAGNOSIS — Z23 Encounter for immunization: Secondary | ICD-10-CM | POA: Diagnosis not present

## 2023-10-23 DIAGNOSIS — N2581 Secondary hyperparathyroidism of renal origin: Secondary | ICD-10-CM | POA: Diagnosis not present

## 2023-10-23 DIAGNOSIS — N186 End stage renal disease: Secondary | ICD-10-CM | POA: Diagnosis not present

## 2023-10-23 DIAGNOSIS — Z992 Dependence on renal dialysis: Secondary | ICD-10-CM | POA: Diagnosis not present

## 2023-10-23 NOTE — Telephone Encounter (Signed)
Pt wants clarification on why medication was sent to [facility]

## 2023-10-24 DIAGNOSIS — D631 Anemia in chronic kidney disease: Secondary | ICD-10-CM | POA: Diagnosis not present

## 2023-10-24 DIAGNOSIS — B952 Enterococcus as the cause of diseases classified elsewhere: Secondary | ICD-10-CM | POA: Diagnosis not present

## 2023-10-24 DIAGNOSIS — N186 End stage renal disease: Secondary | ICD-10-CM | POA: Diagnosis not present

## 2023-10-24 DIAGNOSIS — J151 Pneumonia due to Pseudomonas: Secondary | ICD-10-CM | POA: Diagnosis not present

## 2023-10-24 DIAGNOSIS — J440 Chronic obstructive pulmonary disease with acute lower respiratory infection: Secondary | ICD-10-CM | POA: Diagnosis not present

## 2023-10-24 DIAGNOSIS — I120 Hypertensive chronic kidney disease with stage 5 chronic kidney disease or end stage renal disease: Secondary | ICD-10-CM | POA: Diagnosis not present

## 2023-10-24 NOTE — Telephone Encounter (Signed)
Called and spoke to patient's spouse, Patricia(DPR). She is concerned about cost of nebulizer medications. Previously this co pay was a lot cheaper. She is not sure which medications are not covered, as she is currently out and not at home. She will call back on Monday with names. She stated that she had already contacted insurance and was told that medications are not covered, however she feels that these medications are covered because previously the medications were filled with a much cheaper co pay. She is going to call the pharmacy and ask which insurance paid for the medications previously.  She will call back with an update.

## 2023-10-25 DIAGNOSIS — N186 End stage renal disease: Secondary | ICD-10-CM | POA: Diagnosis not present

## 2023-10-25 DIAGNOSIS — Z23 Encounter for immunization: Secondary | ICD-10-CM | POA: Diagnosis not present

## 2023-10-25 DIAGNOSIS — N2581 Secondary hyperparathyroidism of renal origin: Secondary | ICD-10-CM | POA: Diagnosis not present

## 2023-10-25 DIAGNOSIS — Z992 Dependence on renal dialysis: Secondary | ICD-10-CM | POA: Diagnosis not present

## 2023-10-25 DIAGNOSIS — D509 Iron deficiency anemia, unspecified: Secondary | ICD-10-CM | POA: Diagnosis not present

## 2023-10-25 DIAGNOSIS — D631 Anemia in chronic kidney disease: Secondary | ICD-10-CM | POA: Diagnosis not present

## 2023-10-27 DIAGNOSIS — N186 End stage renal disease: Secondary | ICD-10-CM | POA: Diagnosis not present

## 2023-10-27 DIAGNOSIS — D631 Anemia in chronic kidney disease: Secondary | ICD-10-CM | POA: Diagnosis not present

## 2023-10-27 DIAGNOSIS — J151 Pneumonia due to Pseudomonas: Secondary | ICD-10-CM | POA: Diagnosis not present

## 2023-10-27 DIAGNOSIS — B952 Enterococcus as the cause of diseases classified elsewhere: Secondary | ICD-10-CM | POA: Diagnosis not present

## 2023-10-27 DIAGNOSIS — I120 Hypertensive chronic kidney disease with stage 5 chronic kidney disease or end stage renal disease: Secondary | ICD-10-CM | POA: Diagnosis not present

## 2023-10-27 DIAGNOSIS — J440 Chronic obstructive pulmonary disease with acute lower respiratory infection: Secondary | ICD-10-CM | POA: Diagnosis not present

## 2023-10-27 NOTE — Telephone Encounter (Signed)
Spoke with the pt's caregiver, Kat Pt tried POC on 3lpm at the grocery store today, and his sats were in the low 80's  He felt very tired  She thinks that going to continuous flow would be a better option for the pt  Beth- what do you think? Could we order a best fit eval for this?

## 2023-10-27 NOTE — Telephone Encounter (Signed)
Please, yes agree with continuous flow. Should be using tanks. Place order for best fit eval stat with DME

## 2023-10-28 ENCOUNTER — Telehealth: Payer: Self-pay

## 2023-10-28 DIAGNOSIS — B952 Enterococcus as the cause of diseases classified elsewhere: Secondary | ICD-10-CM | POA: Diagnosis not present

## 2023-10-28 DIAGNOSIS — N2581 Secondary hyperparathyroidism of renal origin: Secondary | ICD-10-CM | POA: Diagnosis not present

## 2023-10-28 DIAGNOSIS — N186 End stage renal disease: Secondary | ICD-10-CM | POA: Diagnosis not present

## 2023-10-28 DIAGNOSIS — J151 Pneumonia due to Pseudomonas: Secondary | ICD-10-CM | POA: Diagnosis not present

## 2023-10-28 DIAGNOSIS — Z992 Dependence on renal dialysis: Secondary | ICD-10-CM | POA: Diagnosis not present

## 2023-10-28 DIAGNOSIS — Z23 Encounter for immunization: Secondary | ICD-10-CM | POA: Diagnosis not present

## 2023-10-28 DIAGNOSIS — D631 Anemia in chronic kidney disease: Secondary | ICD-10-CM | POA: Diagnosis not present

## 2023-10-28 DIAGNOSIS — R0902 Hypoxemia: Secondary | ICD-10-CM

## 2023-10-28 DIAGNOSIS — J440 Chronic obstructive pulmonary disease with acute lower respiratory infection: Secondary | ICD-10-CM | POA: Diagnosis not present

## 2023-10-28 DIAGNOSIS — D509 Iron deficiency anemia, unspecified: Secondary | ICD-10-CM | POA: Diagnosis not present

## 2023-10-28 DIAGNOSIS — J411 Mucopurulent chronic bronchitis: Secondary | ICD-10-CM

## 2023-10-28 DIAGNOSIS — E440 Moderate protein-calorie malnutrition: Secondary | ICD-10-CM

## 2023-10-28 DIAGNOSIS — I120 Hypertensive chronic kidney disease with stage 5 chronic kidney disease or end stage renal disease: Secondary | ICD-10-CM | POA: Diagnosis not present

## 2023-10-28 NOTE — Telephone Encounter (Signed)
Copied from CRM [id number]. Topic: Referral - Request for Referral >> Oct 27, 2023 10:10 AM [name] wrote:  Type of order/referral and detailed reason for visit:   Jeremy [name] from [facility] [phone] calling to request patient get barium swallow study repeated.  Did the patient discuss referral with their provider in the last year? yes (If No - schedule appointment) (If Yes - send message)  Appointment offered? no  Preference of office, provider, location:   If referral order, have you been seen by this specialty before? yes (If Yes, this issue or another issue? When? Where?  Can we respond through MyChart? yes

## 2023-10-28 NOTE — Telephone Encounter (Addendum)
Order for best fit for continuous flow o2 was placed  I called Jeremy [name] and there was no answer- LMTCB

## 2023-10-29 ENCOUNTER — Telehealth: Payer: Self-pay | Admitting: Primary Care

## 2023-10-29 DIAGNOSIS — J9611 Chronic respiratory failure with hypoxia: Secondary | ICD-10-CM

## 2023-10-29 NOTE — Telephone Encounter (Signed)
Unable to speak to [facility], as she is not listed on DPR.   Spoke to patient's spouse, Pat(DPR). She stated that patient's oxygen level is dropping in the low 80's with 3 pulse. She is concerned that patient needs continuous flow.   Pat requested that I speak with Kat. I received a verbal from patient to speak with [facility].   Spoke to [facility] and she confirmed that patient does not seem to tolerate pulse dosage.  SOB is baseline.  No new or worsening sx.    Beth, please advise. Thanks

## 2023-10-29 NOTE — Telephone Encounter (Signed)
Kat caregiver is calling again. Kat phone number is [phone].

## 2023-10-29 NOTE — Telephone Encounter (Signed)
Order placed to [facility].  Lm for patient.

## 2023-10-29 NOTE — Telephone Encounter (Signed)
Kat caregiver is returning phone call. Kat phone number is [phone].

## 2023-10-29 NOTE — Telephone Encounter (Signed)
Yes, I think I already said he should be on continuous flow at 3L if desaturating on pulsed. We can place order for patient to be provided tanks/best fit eval with DME and recommend discontinue POC use

## 2023-10-30 DIAGNOSIS — D631 Anemia in chronic kidney disease: Secondary | ICD-10-CM | POA: Diagnosis not present

## 2023-10-30 DIAGNOSIS — Z23 Encounter for immunization: Secondary | ICD-10-CM | POA: Diagnosis not present

## 2023-10-30 DIAGNOSIS — N186 End stage renal disease: Secondary | ICD-10-CM | POA: Diagnosis not present

## 2023-10-30 DIAGNOSIS — Z992 Dependence on renal dialysis: Secondary | ICD-10-CM | POA: Diagnosis not present

## 2023-10-30 DIAGNOSIS — D509 Iron deficiency anemia, unspecified: Secondary | ICD-10-CM | POA: Diagnosis not present

## 2023-10-30 DIAGNOSIS — N2581 Secondary hyperparathyroidism of renal origin: Secondary | ICD-10-CM | POA: Diagnosis not present

## 2023-10-31 ENCOUNTER — Encounter (HOSPITAL_COMMUNITY)
Admission: RE | Disposition: A | Discharge status: Home / Self Care | Payer: Self-pay | Source: Home / Self Care | Attending: Nephrology

## 2023-10-31 ENCOUNTER — Ambulatory Visit (HOSPITAL_COMMUNITY)
Admission: RE | Admit: 2023-10-31 | Discharge: 2023-10-31 | Disposition: A | Discharge status: Home / Self Care | Payer: Medicare Other | Attending: Nephrology | Admitting: Nephrology

## 2023-10-31 ENCOUNTER — Other Ambulatory Visit: Payer: Self-pay

## 2023-10-31 DIAGNOSIS — J449 Chronic obstructive pulmonary disease, unspecified: Secondary | ICD-10-CM | POA: Insufficient documentation

## 2023-10-31 DIAGNOSIS — D631 Anemia in chronic kidney disease: Secondary | ICD-10-CM | POA: Diagnosis not present

## 2023-10-31 DIAGNOSIS — T82898A Other specified complication of vascular prosthetic devices, implants and grafts, initial encounter: Secondary | ICD-10-CM | POA: Insufficient documentation

## 2023-10-31 DIAGNOSIS — I120 Hypertensive chronic kidney disease with stage 5 chronic kidney disease or end stage renal disease: Secondary | ICD-10-CM | POA: Diagnosis not present

## 2023-10-31 DIAGNOSIS — N250 Renal osteodystrophy: Secondary | ICD-10-CM | POA: Diagnosis not present

## 2023-10-31 DIAGNOSIS — G4733 Obstructive sleep apnea (adult) (pediatric): Secondary | ICD-10-CM | POA: Diagnosis not present

## 2023-10-31 DIAGNOSIS — Y832 Surgical operation with anastomosis, bypass or graft as the cause of abnormal reaction of the patient, or of later complication, without mention of misadventure at the time of the procedure: Secondary | ICD-10-CM | POA: Diagnosis not present

## 2023-10-31 DIAGNOSIS — N186 End stage renal disease: Secondary | ICD-10-CM | POA: Diagnosis not present

## 2023-10-31 DIAGNOSIS — I871 Compression of vein: Secondary | ICD-10-CM | POA: Diagnosis not present

## 2023-10-31 DIAGNOSIS — J9610 Chronic respiratory failure, unspecified whether with hypoxia or hypercapnia: Secondary | ICD-10-CM | POA: Diagnosis not present

## 2023-10-31 DIAGNOSIS — T82858A Stenosis of vascular prosthetic devices, implants and grafts, initial encounter: Secondary | ICD-10-CM | POA: Diagnosis not present

## 2023-10-31 DIAGNOSIS — Z992 Dependence on renal dialysis: Secondary | ICD-10-CM | POA: Diagnosis not present

## 2023-10-31 DIAGNOSIS — Z8551 Personal history of malignant neoplasm of bladder: Secondary | ICD-10-CM | POA: Insufficient documentation

## 2023-10-31 DIAGNOSIS — Z87891 Personal history of nicotine dependence: Secondary | ICD-10-CM | POA: Insufficient documentation

## 2023-10-31 HISTORY — PX: A/V FISTULAGRAM: CATH118298

## 2023-10-31 LAB — POCT I-STAT, CHEM 8
BUN: 30 mg/dL — ABNORMAL HIGH (ref 8–23)
Calcium, Ion: 1.13 mmol/L — ABNORMAL LOW (ref 1.15–1.40)
Chloride: 97 mmol/L — ABNORMAL LOW (ref 98–111)
Creatinine, Ser: 4.6 mg/dL — ABNORMAL HIGH (ref 0.61–1.24)
Glucose, Bld: 90 mg/dL (ref 70–99)
HCT: 22 % — ABNORMAL LOW (ref 39.0–52.0)
Hemoglobin: 7.5 g/dL — ABNORMAL LOW (ref 13.0–17.0)
Potassium: 3.6 mmol/L (ref 3.5–5.1)
Sodium: 141 mmol/L (ref 135–145)
TCO2: 32 mmol/L (ref 22–32)

## 2023-10-31 SURGERY — A/V FISTULAGRAM
Anesthesia: LOCAL | Laterality: Left

## 2023-10-31 MED ORDER — SODIUM CHLORIDE 0.9 % IV SOLN: INTRAVENOUS | Status: DC

## 2023-10-31 MED ORDER — ACETAMINOPHEN 325 MG PO TABS: 650.0000 mg | ORAL_TABLET | ORAL | Status: DC | PRN

## 2023-10-31 MED ORDER — MIDAZOLAM HCL 2 MG/2ML IJ SOLN
INTRAMUSCULAR | Status: DC | PRN
  Administered 2023-10-31: .5 mg via INTRAVENOUS

## 2023-10-31 MED ORDER — FENTANYL CITRATE (PF) 100 MCG/2ML IJ SOLN
INTRAMUSCULAR | Status: AC
  Filled 2023-10-31: qty 2

## 2023-10-31 MED ORDER — HEPARIN (PORCINE) IN NACL 1000-0.9 UT/500ML-% IV SOLN
INTRAVENOUS | Status: DC | PRN
  Administered 2023-10-31: 500 mL

## 2023-10-31 MED ORDER — ONDANSETRON HCL 4 MG/2ML IJ SOLN
4.0000 mg | Freq: Four times a day (QID) | INTRAMUSCULAR | Status: DC | PRN
Start: 2023-10-31 — End: 2023-10-31

## 2023-10-31 MED ORDER — FENTANYL CITRATE (PF) 100 MCG/2ML IJ SOLN
INTRAMUSCULAR | Status: DC | PRN
  Administered 2023-10-31: 25 ug via INTRAVENOUS

## 2023-10-31 MED ORDER — LIDOCAINE HCL (PF) 1 % IJ SOLN
INTRAMUSCULAR | Status: DC | PRN
  Administered 2023-10-31: 5 mL

## 2023-10-31 MED ORDER — MIDAZOLAM HCL 2 MG/2ML IJ SOLN
INTRAMUSCULAR | Status: AC
  Filled 2023-10-31: qty 2

## 2023-10-31 MED ORDER — IODIXANOL 320 MG/ML IV SOLN
INTRAVENOUS | Status: DC | PRN
  Administered 2023-10-31: 6 mL

## 2023-10-31 MED ORDER — LIDOCAINE HCL (PF) 1 % IJ SOLN
INTRAMUSCULAR | Status: AC
  Filled 2023-10-31: qty 30

## 2023-10-31 MED ORDER — SODIUM CHLORIDE 0.9% FLUSH: 3.0000 mL | INTRAVENOUS | Status: DC | PRN

## 2023-10-31 SURGICAL SUPPLY — 4 items
COVER DOME SNAP 22 D (MISCELLANEOUS) ×1 IMPLANT
SHEATH PINNACLE R/O II 6F 4CM (SHEATH) IMPLANT
TRAY PV CATH (CUSTOM PROCEDURE TRAY) ×1 IMPLANT
WIRE STARTER BENTSON 035X150 (WIRE) IMPLANT

## 2023-10-31 NOTE — Discharge Instructions (Signed)

## 2023-10-31 NOTE — Op Note (Signed)
Patient presents with decreased access flows of his left BCF  with branch ligation of 3 large tributaries on 03/17/2023 by Dr. Karin Lieu..  This is his 1st  procedure on the access outside of surgery.   On examination, the brachial cephalic fistula is evidence of a nonpulsatile hematoma along the outflow.   Summary:  1)      The patient had normal fistulogram of the brachiocephalic fistula with a widely patent outflow, arch with minimal tortuosity at 1 focal level which appears to be where one of the ligations of a collateral was performed. 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 3)      The centrals and inflow were widely patent. 4)      This left BCF remains amenable to future percutaneous intervention.  Description of procedure: The arm was prepped and draped in the usual sterile fashion. The left upper arm brachial cephalic fistula was cannulated (16109) with an 18G Angiocath needle directed in an antegrade direction. A guidewire was inserted and exchanged for a 6 Fr sheath. Contrast [phone]) injection via the side port of the sheath was performed. The angiogram of the fistula (09811) showed 2 aneurysms in the  body of the left BCF, patent outflow cephalic vein + cephalic vein arch + confluence. The inflow anastomosis and left centrals were patent.  We also looked at the mild tortuosity where a branch ligation was performed in the outflow cephalic vein and there was no evidence of any stenosis.  Hemostasis: A 3-0 ethilon purse string suture was placed at the cannulation site on removal of the sheath.  Sedation: 0.5 mg Versed, Fentanyl.  Sedation time: 5 minutes  Contrast. 6 mL  Monitoring: Because of the patient's comorbid conditions and sedation during the procedure, continuous EKG monitoring and O2 saturation monitoring was performed throughout the procedure by the RN. There were no abnormal arrhythmias encountered.  Complications: None.    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 Continue to cannulate the fistula with 15G needles.  2. Refer back for problems with flows. 3. Remove the suture next treatment.   Discharge: The patient was discharged home in stable condition. The patient was given education regarding the care of the dialysis access AVF and specific instructions in case of any problems.

## 2023-10-31 NOTE — H&P (Addendum)
Chief Complaint: Decreased flows  OP HD: SW TTS  3h   73.3kg   400/600  2/2 bath  Heparin 2500  LUA AVF Hectorol 2   Interval H&P  The patient has presented today for an angiogram/ angioplasty; patient is followed at [facility] with Dr. Thedore Mins.  Various methods of treatment have been discussed with the patient.  After consideration of risk, benefits and other options for treatment, the patient has consented to a angiogram/ angioplasty with  possible stent placement.   Risks of angiogram with potential angioplasty and stenting if needed.contrast reaction, extravasation/ bleeding, dissection, hypotension and death were explained to the patient.  The patient's history has been reviewed and the patient has been examined, no changes in status.  Stable for angiogram/angioplasty  I have reviewed the patient's chart and labs.  Questions were answered to the patient's satisfaction.   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 Karin Lieu.. H/o Sepsis / UTI/ PNA in October 2024 with UCx grew enterobacter and sputum cx pseudomonas.  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  HTN - restart home regimen H/o Urinary retention  Anemia of esrd  Chronic resp failure - h/o OSA + COPD H/o bladder cancer - recent cystoscopy in February 2023 without evidence of recurrent disease.  HPI: Jeremy [name] is an 82 y.o. male with a history of OSA, COPD, bladder cancer with a negative cystoscopy in February 2023, history of urinary retention, hypertension, ESRD dialyzing Tuesday Thursday and Saturdays at [facility] with Dr. Thedore Mins.  Pt denies fever, chills, nausea, vomiting, myalgias, SOB, CP.  ROS Per HPI.  Chemistry and CBC: Creat  Date/Time Value Ref Range Status  04/23/2022 12:00 AM 3.23 (H)  0.70 - 1.22 mg/dL Final  82/95/6213 08:65 AM 1.67 (H) 0.70 - 1.11 mg/dL Final    Comment:    For patients >79 years of age, the reference limit for Creatinine is approximately 13% higher for people identified as African-American. .    Creatinine, Ser  Date/Time Value Ref Range Status  08/19/2023 09:31 PM 4.76 (H) 0.61 - 1.24 mg/dL Final  78/46/9629 52:84 AM 7.38 (H) 0.61 - 1.24 mg/dL Final  13/24/4010 27:25 AM 5.79 (H) 0.61 - 1.24 mg/dL Final  36/64/4034 74:25 AM 4.66 (H) 0.61 - 1.24 mg/dL Final  95/63/8756 43:32 AM 4.39 (H) 0.61 - 1.24 mg/dL Final  95/18/8416 60:63 AM 7.02 (H) 0.61 - 1.24 mg/dL Final  01/60/1093 23:55 AM 5.13 (H) 0.61 - 1.24 mg/dL Final  73/22/0254 27:06 AM 6.08 (H) 0.61 - 1.24 mg/dL Final  23/76/2831 51:76 PM 5.65 (H) 0.61 - 1.24 mg/dL Final  16/05/3709 62:69 AM 8.20 (H) 0.61 - 1.24 mg/dL Final  48/54/6270 35:00 AM 5.90 (H) 0.61 - 1.24 mg/dL Final  93/81/8299 37:16 AM 9.50 (H) 0.61 - 1.24 mg/dL Final  96/78/9381 01:75 AM 7.13 (H) 0.61 - 1.24 mg/dL Final  09/04/8526 78:24 AM 6.92 (H) 0.61 - 1.24 mg/dL Final  23/53/6144 31:54 AM 7.30 (H) 0.61 - 1.24 mg/dL Final  00/86/7619 50:93 AM 7.67 (H) 0.61 - 1.24 mg/dL Final  26/71/2458 09:98 AM 7.85 (H) 0.61 - 1.24 mg/dL Final  33/82/5053 97:67 AM 7.82 (H) 0.61 - 1.24 mg/dL Final  34/19/3790 24:09 AM 2.03 (H) 0.61 - 1.24 mg/dL Final  73/53/2992 42:68 AM 1.58 (H) 0.40 - 1.50 mg/dL Final  07/06/2019 08:53 AM 1.53 (H) 0.40 - 1.50 mg/dL Final  78/29/5621 30:86 AM 1.39 0.40 - 1.50 mg/dL Final  57/84/6962 95:28 PM 1.22 0.40 - 1.50 mg/dL Final  41/32/4401 02:72 AM 1.21 0.61 - 1.24 mg/dL Final  53/66/4403 47:42 AM 1.55 (H) 0.61 - 1.24 mg/dL Final  59/56/3875 64:33 PM 1.52 (H) 0.61 - 1.24 mg/dL Final  29/51/8841 66:06 AM 1.22 0.61 - 1.24 mg/dL Final  30/16/0109 32:35 AM 1.18 0.40 - 1.50 mg/dL Final  57/32/2025 42:70 AM 1.26 0.40 - 1.50 mg/dL Final  62/37/6283 15:17 PM 1.19 0.40 - 1.50 mg/dL Final  61/60/7371 06:26 PM 1.56 (H)  0.40 - 1.50 mg/dL Final  94/85/4627 03:50 AM 1.1 0.4 - 1.5 mg/dL Final  09/38/1829 93:71 PM 0.88 0.50 - 1.35 mg/dL Final  69/67/8938 10:17 AM 1.2 0.4 - 1.5 mg/dL Final  51/12/5850 77:82 AM 1.1 0.4 - 1.5 mg/dL Final  42/35/3614 43:15 AM 1.1 0.4 - 1.5 mg/dL Final  40/06/6760 95:09 AM 1.1 0.4 - 1.5 mg/dL Final  32/67/1245 80:99 AM 1.0 0.4 - 1.5 mg/dL Final  83/38/2505 39:76 AM 1.1 0.4 - 1.5 mg/dL Final    Comment:    See lab report for associated comment(s)  06/11/2007 09:29 AM 1.1 0.4 - 1.5 mg/dL Final   No results for input(s): "NA", "K", "CL", "CO2", "GLUCOSE", "BUN", "CREATININE", "CALCIUM", "PHOS" in the last 168 hours.  Invalid input(s): "ALB" No results for input(s): "WBC", "NEUTROABS", "HGB", "HCT", "MCV", "PLT" in the last 168 hours. Liver Function Tests: No results for input(s): "AST", "ALT", "ALKPHOS", "BILITOT", "PROT", "ALBUMIN" in the last 168 hours. No results for input(s): "LIPASE", "AMYLASE" in the last 168 hours. No results for input(s): "AMMONIA" in the last 168 hours. Cardiac Enzymes: No results for input(s): "CKTOTAL", "CKMB", "CKMBINDEX", "TROPONINI" in the last 168 hours. Iron Studies: No results for input(s): "IRON", "TIBC", "TRANSFERRIN", "FERRITIN" in the last 72 hours. PT/INR: @LABRCNTIP (inr:5)  Xrays/Other Studies: )No results found for this or any previous visit (from the past 48 hours). No results found.  PMH:   Past Medical History:  Diagnosis Date   Anemia    Cancer (HCC)    patient and family have not been told that he has cancer.   Chronic kidney disease    dialysis T/Th/Sa   COPD (chronic obstructive pulmonary disease) (HCC)    Enlarged prostate    History of blood transfusion    Hyperlipidemia    Hypertension    Self-catheterizes urinary bladder    pt. does this morning and evening--been doing this since 11/2016   Sleep apnea    not using a cpap machine    PSH:   Past Surgical History:  Procedure Laterality Date   A/V FISTULAGRAM  Left 03/12/2023   Procedure: A/V Fistulagram;  Surgeon: Victorino Sparrow, MD;  Location: [facility] INVASIVE CV LAB;  Service: Cardiovascular;  Laterality: Left;   AV FISTULA PLACEMENT Left 10/16/2022   Procedure: LEFT BRACHIOCEPHALIC ARTERIOVENOUS (AV) FISTULA CREATION;  Surgeon: Cephus Shelling, MD;  Location: [facility] OR;  Service: Vascular;  Laterality: Left;   BACK SURGERY     BLADDER TUMOR EXCISION  2015   benign   BLEPHAROPLASTY  12/2018   LIGATION OF COMPETING BRANCHES OF ARTERIOVENOUS FISTULA Left 03/17/2023   Procedure: LEFT ARM FISTULA BRANCH LIGATION;  Surgeon: Victorino Sparrow, MD;  Location: [facility] OR;  Service: Vascular;  Laterality: Left;   LUMBAR LAMINECTOMY/DECOMPRESSION MICRODISCECTOMY Bilateral 08/25/2017   Procedure: Bilateral Lumbar Three- Four, Lumbar Four- Five, Lumbar Five- Sacral One  Laminectomy;  Surgeon: Barnett Abu, MD;  Location: [facility] OR;  Service: Neurosurgery;  Laterality: Bilateral;  Bilateral L3-4 L4-5 L5-S1 Laminectomy   SKIN CANCER EXCISION     Surg x2...   TONSILLECTOMY AND ADENOIDECTOMY     Surg as a child...   TRANSURETHRAL RESECTION OF BLADDER TUMOR N/A 01/03/2022   Procedure: TRANSURETHRAL RESECTION OF BLADDER TUMOR (TURBT)/ CYSTOSCOPY/ EXAM UNDER ANESTHESIA, BILATERAL RETROGRADE/ BLADDER BIOPSIES;  Surgeon: Heloise Purpura, MD;  Location: [facility] ORS;  Service: Urology;  Laterality: N/A;  GENERAL ANESTHESIA WITH PARALYSIS    Allergies:  Allergies  Allergen Reactions   Sulfa Antibiotics     Rash, light headed, shaking   Penicillins Other (See Comments)    hives severe as a child    Tape     Pulls skin off    Medications:   Prior to Admission medications   Medication Sig Start Date End Date Taking? Authorizing Provider  arformoterol (BROVANA) 15 MCG/2ML NEBU Take 2 mLs (15 mcg total) by nebulization 2 (two) times daily. 10/08/23  Yes Glenford Bayley, NP  budesonide (PULMICORT) 0.5 MG/2ML nebulizer solution Take 2 mLs (0.5 mg total) by nebulization 2 (two) times  daily. 10/08/23  Yes Glenford Bayley, NP  Cholecalciferol (VITAMIN D) 50 MCG (2000 UT) CAPS Take 2,000 Units by mouth daily.   Yes [provider]  fluticasone (FLONASE) 50 MCG/ACT nasal spray Place 1 spray into both nostrils daily as needed for allergies or rhinitis.   Yes [provider]  guaiFENesin (ROBITUSSIN) 100 MG/5ML liquid Take 20 mLs by mouth 2 (two) times daily. Patient taking differently: Take 20 mLs by mouth 2 (two) times daily. Dm Max 08/19/23  Yes Regalado, Belkys A, MD  ipratropium (ATROVENT) 0.03 % nasal spray Place 2 sprays into both nostrils 2 (two) times daily. 10/07/23  Yes Glenford Bayley, NP  metoprolol tartrate (LOPRESSOR) 25 MG tablet Take 1 tablet (25 mg total) by mouth 2 (two) times daily. 08/19/23  Yes Regalado, Belkys A, MD  multivitamin (RENA-VIT) TABS tablet Take 1 tablet by mouth at bedtime. 08/19/23  Yes Regalado, Belkys A, MD  nicotine (NICODERM CQ - DOSED IN MG/24 HOURS) 14 mg/24hr patch Place 1 patch (14 mg total) onto the skin daily. 08/19/23  Yes Regalado, Belkys A, MD  OXYGEN Inhale 3 L into the lungs continuous.   Yes [provider]  revefenacin (YUPELRI) 175 MCG/3ML nebulizer solution Take 3 mLs (175 mcg total) by nebulization daily. 10/08/23  Yes Glenford Bayley, NP  sevelamer carbonate (RENVELA) 0.8 g PACK packet Take 0.8 g by mouth daily as needed (snacks). 10/28/23  Yes [provider]  sevelamer carbonate (RENVELA) 2.4 g PACK Take 2.4 g by mouth 3 (three) times daily with meals.   Yes [provider]  AMBULATORY NON FORMULARY MEDICATION Please provide portable O2 concentrator for patient. Flow 2L/min, 24/7 use.  J96.10-Chronic respiratory failure. 09/12/23   Everrett Coombe, DO  AMBULATORY NON FORMULARY MEDICATION Please provide portable oxygen concentrator.  Flow 3L/min.  Dx: Chronic respiratory failure J96.11, COPD J42 09/22/23   Everrett Coombe, DO  docusate (COLACE) 50 MG/5ML liquid Take 10 mLs (100 mg  total) by mouth 2 (two) times daily. Patient not taking: Reported on 10/28/2023 08/19/23   Regalado, Jon Billings A, MD  metoprolol succinate (TOPROL-XL) 50 MG 24 hr tablet Take 50 mg by mouth daily. 10/17/23   [provider]  polyethylene glycol (MIRALAX / GLYCOLAX) 17 g packet Take 17 g by mouth daily. Patient not  taking: Reported on 10/28/2023 08/19/23   Alba Cory, MD    Discontinued Meds:   Medications Discontinued During This Encounter  Medication Reason   sevelamer carbonate (RENVELA) 800 MG tablet Completed Course   0.9 %  sodium chloride infusion     Social History:  reports that he quit smoking about 3 months ago. His smoking use included cigarettes. He started smoking about 61 years ago. He has a 12.3 pack-year smoking history. He has never been exposed to tobacco smoke. He has never used smokeless tobacco. He reports that he does not currently use alcohol. He reports that he does not use drugs.  Family History:   Family History  Problem Relation Age of Onset   Colon cancer Neg Hx    Stomach cancer Neg Hx    Rectal cancer Neg Hx     Blood pressure (!) 150/60, pulse 77, temperature 98.2 F (36.8 C), resp. rate 14, weight 77.6 kg, SpO2 96%. Alert, on 2L [place], open mouth starting at ceiling Not in distress, chronically ill appearing though Chest - CTA bilat Cor reg no mrg Abd soft, ntnd  Ext no edema UE/ LE"s   LUA AVF+ bruit but pulsatile towards the outflow evidence of infiltration along the venous limb       Rajiv Parlato, Len Blalock, MD 10/31/2023, 9:09 AM

## 2023-10-31 NOTE — Addendum Note (Signed)
Addended by: Gay Filler T on: 10/31/2023 09:58 AM   Modules accepted: Orders

## 2023-10-31 NOTE — Telephone Encounter (Signed)
Patient's spouse, Pat(DPR) is aware of below of below message and voiced her understanding.  Nothing further needed.

## 2023-11-01 DIAGNOSIS — Z992 Dependence on renal dialysis: Secondary | ICD-10-CM | POA: Diagnosis not present

## 2023-11-01 DIAGNOSIS — D631 Anemia in chronic kidney disease: Secondary | ICD-10-CM | POA: Diagnosis not present

## 2023-11-01 DIAGNOSIS — N186 End stage renal disease: Secondary | ICD-10-CM | POA: Diagnosis not present

## 2023-11-01 DIAGNOSIS — Z23 Encounter for immunization: Secondary | ICD-10-CM | POA: Diagnosis not present

## 2023-11-01 DIAGNOSIS — D509 Iron deficiency anemia, unspecified: Secondary | ICD-10-CM | POA: Diagnosis not present

## 2023-11-01 DIAGNOSIS — N2581 Secondary hyperparathyroidism of renal origin: Secondary | ICD-10-CM | POA: Diagnosis not present

## 2023-11-03 ENCOUNTER — Telehealth: Payer: Self-pay | Admitting: Primary Care

## 2023-11-03 ENCOUNTER — Encounter (HOSPITAL_COMMUNITY): Payer: Self-pay | Admitting: Nephrology

## 2023-11-03 DIAGNOSIS — I120 Hypertensive chronic kidney disease with stage 5 chronic kidney disease or end stage renal disease: Secondary | ICD-10-CM | POA: Diagnosis not present

## 2023-11-03 DIAGNOSIS — N186 End stage renal disease: Secondary | ICD-10-CM | POA: Diagnosis not present

## 2023-11-03 DIAGNOSIS — Z992 Dependence on renal dialysis: Secondary | ICD-10-CM | POA: Diagnosis not present

## 2023-11-03 DIAGNOSIS — Z23 Encounter for immunization: Secondary | ICD-10-CM | POA: Diagnosis not present

## 2023-11-03 DIAGNOSIS — D509 Iron deficiency anemia, unspecified: Secondary | ICD-10-CM | POA: Diagnosis not present

## 2023-11-03 DIAGNOSIS — J151 Pneumonia due to Pseudomonas: Secondary | ICD-10-CM | POA: Diagnosis not present

## 2023-11-03 DIAGNOSIS — J440 Chronic obstructive pulmonary disease with acute lower respiratory infection: Secondary | ICD-10-CM | POA: Diagnosis not present

## 2023-11-03 DIAGNOSIS — B952 Enterococcus as the cause of diseases classified elsewhere: Secondary | ICD-10-CM | POA: Diagnosis not present

## 2023-11-03 DIAGNOSIS — N2581 Secondary hyperparathyroidism of renal origin: Secondary | ICD-10-CM | POA: Diagnosis not present

## 2023-11-03 DIAGNOSIS — D631 Anemia in chronic kidney disease: Secondary | ICD-10-CM | POA: Diagnosis not present

## 2023-11-03 NOTE — Telephone Encounter (Signed)
[employer] said they don't have this type.

## 2023-11-03 NOTE — Telephone Encounter (Signed)
Pt's wife calling on 02 order from last week. Order states:   Discontinue POC. Pt needs continuous flow O2, and a home concentrator and Tanks.   She has not heard anything and is upset. She now wants to buy her own unit so she does not have these issues in the future and wants to speak to Ms. Clent Ridges to see what she recommends.   Her # is [phone]

## 2023-11-06 DIAGNOSIS — N2581 Secondary hyperparathyroidism of renal origin: Secondary | ICD-10-CM | POA: Diagnosis not present

## 2023-11-06 DIAGNOSIS — D631 Anemia in chronic kidney disease: Secondary | ICD-10-CM | POA: Diagnosis not present

## 2023-11-06 DIAGNOSIS — D509 Iron deficiency anemia, unspecified: Secondary | ICD-10-CM | POA: Diagnosis not present

## 2023-11-06 DIAGNOSIS — Z992 Dependence on renal dialysis: Secondary | ICD-10-CM | POA: Diagnosis not present

## 2023-11-06 DIAGNOSIS — Z23 Encounter for immunization: Secondary | ICD-10-CM | POA: Diagnosis not present

## 2023-11-06 DIAGNOSIS — N186 End stage renal disease: Secondary | ICD-10-CM | POA: Diagnosis not present

## 2023-11-07 ENCOUNTER — Other Ambulatory Visit (HOSPITAL_COMMUNITY): Payer: Self-pay | Admitting: *Deleted

## 2023-11-07 DIAGNOSIS — N186 End stage renal disease: Secondary | ICD-10-CM | POA: Diagnosis not present

## 2023-11-07 DIAGNOSIS — I120 Hypertensive chronic kidney disease with stage 5 chronic kidney disease or end stage renal disease: Secondary | ICD-10-CM | POA: Diagnosis not present

## 2023-11-07 DIAGNOSIS — J440 Chronic obstructive pulmonary disease with acute lower respiratory infection: Secondary | ICD-10-CM | POA: Diagnosis not present

## 2023-11-07 DIAGNOSIS — D631 Anemia in chronic kidney disease: Secondary | ICD-10-CM | POA: Diagnosis not present

## 2023-11-07 DIAGNOSIS — R1310 Dysphagia, unspecified: Secondary | ICD-10-CM

## 2023-11-07 DIAGNOSIS — B952 Enterococcus as the cause of diseases classified elsewhere: Secondary | ICD-10-CM | POA: Diagnosis not present

## 2023-11-07 DIAGNOSIS — R059 Cough, unspecified: Secondary | ICD-10-CM

## 2023-11-07 DIAGNOSIS — J151 Pneumonia due to Pseudomonas: Secondary | ICD-10-CM | POA: Diagnosis not present

## 2023-11-08 DIAGNOSIS — J9622 Acute and chronic respiratory failure with hypercapnia: Secondary | ICD-10-CM | POA: Diagnosis not present

## 2023-11-08 DIAGNOSIS — R1312 Dysphagia, oropharyngeal phase: Secondary | ICD-10-CM | POA: Diagnosis not present

## 2023-11-08 DIAGNOSIS — E871 Hypo-osmolality and hyponatremia: Secondary | ICD-10-CM | POA: Diagnosis not present

## 2023-11-08 DIAGNOSIS — N400 Enlarged prostate without lower urinary tract symptoms: Secondary | ICD-10-CM | POA: Diagnosis not present

## 2023-11-08 DIAGNOSIS — N2581 Secondary hyperparathyroidism of renal origin: Secondary | ICD-10-CM | POA: Diagnosis not present

## 2023-11-08 DIAGNOSIS — N186 End stage renal disease: Secondary | ICD-10-CM | POA: Diagnosis not present

## 2023-11-08 DIAGNOSIS — G4733 Obstructive sleep apnea (adult) (pediatric): Secondary | ICD-10-CM | POA: Diagnosis not present

## 2023-11-08 DIAGNOSIS — Z23 Encounter for immunization: Secondary | ICD-10-CM | POA: Diagnosis not present

## 2023-11-08 DIAGNOSIS — I120 Hypertensive chronic kidney disease with stage 5 chronic kidney disease or end stage renal disease: Secondary | ICD-10-CM | POA: Diagnosis not present

## 2023-11-08 DIAGNOSIS — D631 Anemia in chronic kidney disease: Secondary | ICD-10-CM | POA: Diagnosis not present

## 2023-11-08 DIAGNOSIS — F5101 Primary insomnia: Secondary | ICD-10-CM | POA: Diagnosis not present

## 2023-11-08 DIAGNOSIS — J449 Chronic obstructive pulmonary disease, unspecified: Secondary | ICD-10-CM | POA: Diagnosis not present

## 2023-11-08 DIAGNOSIS — Z8701 Personal history of pneumonia (recurrent): Secondary | ICD-10-CM | POA: Diagnosis not present

## 2023-11-08 DIAGNOSIS — R339 Retention of urine, unspecified: Secondary | ICD-10-CM | POA: Diagnosis not present

## 2023-11-08 DIAGNOSIS — R1310 Dysphagia, unspecified: Secondary | ICD-10-CM | POA: Diagnosis not present

## 2023-11-08 DIAGNOSIS — M4696 Unspecified inflammatory spondylopathy, lumbar region: Secondary | ICD-10-CM | POA: Diagnosis not present

## 2023-11-08 DIAGNOSIS — F17210 Nicotine dependence, cigarettes, uncomplicated: Secondary | ICD-10-CM | POA: Diagnosis not present

## 2023-11-08 DIAGNOSIS — M48061 Spinal stenosis, lumbar region without neurogenic claudication: Secondary | ICD-10-CM | POA: Diagnosis not present

## 2023-11-08 DIAGNOSIS — Z87440 Personal history of urinary (tract) infections: Secondary | ICD-10-CM | POA: Diagnosis not present

## 2023-11-08 DIAGNOSIS — Z9981 Dependence on supplemental oxygen: Secondary | ICD-10-CM | POA: Diagnosis not present

## 2023-11-08 DIAGNOSIS — E875 Hyperkalemia: Secondary | ICD-10-CM | POA: Diagnosis not present

## 2023-11-08 DIAGNOSIS — J9621 Acute and chronic respiratory failure with hypoxia: Secondary | ICD-10-CM | POA: Diagnosis not present

## 2023-11-08 DIAGNOSIS — G9341 Metabolic encephalopathy: Secondary | ICD-10-CM | POA: Diagnosis not present

## 2023-11-08 DIAGNOSIS — D509 Iron deficiency anemia, unspecified: Secondary | ICD-10-CM | POA: Diagnosis not present

## 2023-11-08 DIAGNOSIS — Z466 Encounter for fitting and adjustment of urinary device: Secondary | ICD-10-CM | POA: Diagnosis not present

## 2023-11-08 DIAGNOSIS — E440 Moderate protein-calorie malnutrition: Secondary | ICD-10-CM | POA: Diagnosis not present

## 2023-11-08 DIAGNOSIS — R41841 Cognitive communication deficit: Secondary | ICD-10-CM | POA: Diagnosis not present

## 2023-11-08 DIAGNOSIS — Z992 Dependence on renal dialysis: Secondary | ICD-10-CM | POA: Diagnosis not present

## 2023-11-10 DIAGNOSIS — J9621 Acute and chronic respiratory failure with hypoxia: Secondary | ICD-10-CM | POA: Diagnosis not present

## 2023-11-10 DIAGNOSIS — Z23 Encounter for immunization: Secondary | ICD-10-CM | POA: Diagnosis not present

## 2023-11-10 DIAGNOSIS — Z992 Dependence on renal dialysis: Secondary | ICD-10-CM | POA: Diagnosis not present

## 2023-11-10 DIAGNOSIS — D631 Anemia in chronic kidney disease: Secondary | ICD-10-CM | POA: Diagnosis not present

## 2023-11-10 DIAGNOSIS — D509 Iron deficiency anemia, unspecified: Secondary | ICD-10-CM | POA: Diagnosis not present

## 2023-11-10 DIAGNOSIS — R1312 Dysphagia, oropharyngeal phase: Secondary | ICD-10-CM | POA: Diagnosis not present

## 2023-11-10 DIAGNOSIS — N2581 Secondary hyperparathyroidism of renal origin: Secondary | ICD-10-CM | POA: Diagnosis not present

## 2023-11-10 DIAGNOSIS — J449 Chronic obstructive pulmonary disease, unspecified: Secondary | ICD-10-CM | POA: Diagnosis not present

## 2023-11-10 DIAGNOSIS — N186 End stage renal disease: Secondary | ICD-10-CM | POA: Diagnosis not present

## 2023-11-10 DIAGNOSIS — I120 Hypertensive chronic kidney disease with stage 5 chronic kidney disease or end stage renal disease: Secondary | ICD-10-CM | POA: Diagnosis not present

## 2023-11-10 NOTE — Telephone Encounter (Signed)
Task completed. Per [name]), patient has been scheduled on 12/08/23, 1 pm at [facility].

## 2023-11-10 NOTE — Telephone Encounter (Signed)
71 days old. Please call PT.

## 2023-11-10 NOTE — Telephone Encounter (Signed)
Spoke with the pt's spouse  She states that [facility] was unable to accommodate his o2 needs  They do not want to carry portable continuous tanks with them and this is what [employer] offered  She is asking if there is a way she can purchase portable concentrator that does continuous flow

## 2023-11-11 NOTE — Telephone Encounter (Signed)
Yes they can purchase POC from [employer] as long as they have been walked in office on a POC before and were able to maintain O2 >88%

## 2023-11-11 NOTE — Telephone Encounter (Signed)
 I called the pt's spouse and there was no answer- LMTCB.

## 2023-11-12 DIAGNOSIS — Z992 Dependence on renal dialysis: Secondary | ICD-10-CM | POA: Diagnosis not present

## 2023-11-12 DIAGNOSIS — I129 Hypertensive chronic kidney disease with stage 1 through stage 4 chronic kidney disease, or unspecified chronic kidney disease: Secondary | ICD-10-CM | POA: Diagnosis not present

## 2023-11-12 DIAGNOSIS — N186 End stage renal disease: Secondary | ICD-10-CM | POA: Diagnosis not present

## 2023-11-13 DIAGNOSIS — D509 Iron deficiency anemia, unspecified: Secondary | ICD-10-CM | POA: Diagnosis not present

## 2023-11-13 DIAGNOSIS — Z992 Dependence on renal dialysis: Secondary | ICD-10-CM | POA: Diagnosis not present

## 2023-11-13 DIAGNOSIS — N186 End stage renal disease: Secondary | ICD-10-CM | POA: Diagnosis not present

## 2023-11-13 DIAGNOSIS — N2581 Secondary hyperparathyroidism of renal origin: Secondary | ICD-10-CM | POA: Diagnosis not present

## 2023-11-13 DIAGNOSIS — D631 Anemia in chronic kidney disease: Secondary | ICD-10-CM | POA: Diagnosis not present

## 2023-11-15 DIAGNOSIS — Z992 Dependence on renal dialysis: Secondary | ICD-10-CM | POA: Diagnosis not present

## 2023-11-15 DIAGNOSIS — D509 Iron deficiency anemia, unspecified: Secondary | ICD-10-CM | POA: Diagnosis not present

## 2023-11-15 DIAGNOSIS — N2581 Secondary hyperparathyroidism of renal origin: Secondary | ICD-10-CM | POA: Diagnosis not present

## 2023-11-15 DIAGNOSIS — N186 End stage renal disease: Secondary | ICD-10-CM | POA: Diagnosis not present

## 2023-11-15 DIAGNOSIS — D631 Anemia in chronic kidney disease: Secondary | ICD-10-CM | POA: Diagnosis not present

## 2023-11-16 ENCOUNTER — Ambulatory Visit (HOSPITAL_BASED_OUTPATIENT_CLINIC_OR_DEPARTMENT_OTHER): Payer: Medicare Other | Attending: Primary Care | Admitting: Internal Medicine

## 2023-11-16 DIAGNOSIS — J9611 Chronic respiratory failure with hypoxia: Secondary | ICD-10-CM | POA: Diagnosis not present

## 2023-11-16 DIAGNOSIS — G4733 Obstructive sleep apnea (adult) (pediatric): Secondary | ICD-10-CM | POA: Insufficient documentation

## 2023-11-16 DIAGNOSIS — G4761 Periodic limb movement disorder: Secondary | ICD-10-CM | POA: Diagnosis not present

## 2023-11-16 DIAGNOSIS — G4752 REM sleep behavior disorder: Secondary | ICD-10-CM | POA: Diagnosis not present

## 2023-11-17 DIAGNOSIS — N186 End stage renal disease: Secondary | ICD-10-CM | POA: Diagnosis not present

## 2023-11-17 DIAGNOSIS — J9621 Acute and chronic respiratory failure with hypoxia: Secondary | ICD-10-CM | POA: Diagnosis not present

## 2023-11-17 DIAGNOSIS — J449 Chronic obstructive pulmonary disease, unspecified: Secondary | ICD-10-CM | POA: Diagnosis not present

## 2023-11-17 DIAGNOSIS — I120 Hypertensive chronic kidney disease with stage 5 chronic kidney disease or end stage renal disease: Secondary | ICD-10-CM | POA: Diagnosis not present

## 2023-11-17 DIAGNOSIS — R1312 Dysphagia, oropharyngeal phase: Secondary | ICD-10-CM | POA: Diagnosis not present

## 2023-11-17 DIAGNOSIS — D631 Anemia in chronic kidney disease: Secondary | ICD-10-CM | POA: Diagnosis not present

## 2023-11-18 DIAGNOSIS — D631 Anemia in chronic kidney disease: Secondary | ICD-10-CM | POA: Diagnosis not present

## 2023-11-18 DIAGNOSIS — N2581 Secondary hyperparathyroidism of renal origin: Secondary | ICD-10-CM | POA: Diagnosis not present

## 2023-11-18 DIAGNOSIS — D509 Iron deficiency anemia, unspecified: Secondary | ICD-10-CM | POA: Diagnosis not present

## 2023-11-18 DIAGNOSIS — Z992 Dependence on renal dialysis: Secondary | ICD-10-CM | POA: Diagnosis not present

## 2023-11-18 DIAGNOSIS — N186 End stage renal disease: Secondary | ICD-10-CM | POA: Diagnosis not present

## 2023-11-20 DIAGNOSIS — D509 Iron deficiency anemia, unspecified: Secondary | ICD-10-CM | POA: Diagnosis not present

## 2023-11-20 DIAGNOSIS — D631 Anemia in chronic kidney disease: Secondary | ICD-10-CM | POA: Diagnosis not present

## 2023-11-20 DIAGNOSIS — N186 End stage renal disease: Secondary | ICD-10-CM | POA: Diagnosis not present

## 2023-11-20 DIAGNOSIS — N2581 Secondary hyperparathyroidism of renal origin: Secondary | ICD-10-CM | POA: Diagnosis not present

## 2023-11-20 DIAGNOSIS — Z992 Dependence on renal dialysis: Secondary | ICD-10-CM | POA: Diagnosis not present

## 2023-11-22 DIAGNOSIS — N2581 Secondary hyperparathyroidism of renal origin: Secondary | ICD-10-CM | POA: Diagnosis not present

## 2023-11-22 DIAGNOSIS — N186 End stage renal disease: Secondary | ICD-10-CM | POA: Diagnosis not present

## 2023-11-22 DIAGNOSIS — Z992 Dependence on renal dialysis: Secondary | ICD-10-CM | POA: Diagnosis not present

## 2023-11-22 DIAGNOSIS — D631 Anemia in chronic kidney disease: Secondary | ICD-10-CM | POA: Diagnosis not present

## 2023-11-22 DIAGNOSIS — D509 Iron deficiency anemia, unspecified: Secondary | ICD-10-CM | POA: Diagnosis not present

## 2023-11-22 NOTE — Procedures (Signed)
 Patient Name: Jeremy [name], Jeremy [name] Date: 11/16/2023 Gender: Male D.O.B: [birth year] Age (years): 48 Referring Provider: Almarie Ferrari NP Height (inches): 71 Interpreting Physician: Reggy Salt MD, ABSM Weight (lbs): 167 RPSGT: [place], [place] BMI: 23 MRN: [id number] Neck Size: 15.25  CLINICAL INFORMATION The patient is referred for a CPAP titration to treat sleep apnea.  Date of NPSG, Split Night or HST: NPSG 03/21/12  AHI 43.1/hr, desaturation to 78%  SLEEP STUDY TECHNIQUE As per the AASM Manual for the Scoring of Sleep and Associated Events v2.3 (April 2016) with a hypopnea requiring 4% desaturations.  The channels recorded and monitored were frontal, central and occipital EEG, electrooculogram (EOG), submentalis EMG (chin), nasal and oral airflow, thoracic and abdominal wall motion, anterior tibialis EMG, snore microphone, electrocardiogram, and pulse oximetry. Continuous positive airway pressure (CPAP) was initiated at the beginning of the study and titrated to treat sleep-disordered breathing.  MEDICATIONS Medications self-administered by patient taken the night of the study : none reported  TECHNICIAN COMMENTS Comments added by technician: Patient was restless all through the night. RBD observed during the night. The patient requested to end study early due to significant dry mouth and discomfort. Patient requested to end study early due to PAP intolerance. Oxygen  therapy was used at 3 LPM during this study. Comments added by scorer: N/A  RESPIRATORY PARAMETERS Optimal PAP Pressure (cm): 11 AHI at Optimal Pressure (/hr): 1.9 Overall Minimal O2 (%): 86.0 Supine % at Optimal Pressure (%): 51 Minimal O2 at Optimal Pressure (%): 92.0   SLEEP ARCHITECTURE The study was initiated at 10:39:37 PM and ended at 4:45:40 AM.  Sleep onset time was 47.8 minutes and the sleep efficiency was 51.4%. The total sleep time was 188.3 minutes.  The patient spent 6.6% of the night in  stage N1 sleep, 62.3% in stage N2 sleep, 5.3% in stage N3 and 25.8% in REM.Stage REM latency was 157.0 minutes  Wake after sleep onset was 130.0. Alpha intrusion was absent. Supine sleep was 39.35%.  CARDIAC DATA The 2 lead EKG demonstrated sinus rhythm. The mean heart rate was 75.1 beats per minute. Other EKG findings include: None.  LEG MOVEMENT DATA The total Periodic Limb Movements of Sleep (PLMS) were 0. The PLMS index was 0.0. A PLMS index of <15 is considered normal in adults.  IMPRESSIONS - The optimal PAP pressure was 11 cm of water . - Central sleep apnea was not noted during this titration (CAI = 1/h). - Patient was studied on O2 3L per his home routine. Moderate oxygen  desaturations were observed during this titration (min O2 = 86.0%). On CPAP 11 with O2 3L, minimum O2 saturation 92%.   Because of complaint of dryness, CPAP was removed but O2 3L continued for the last 30 minutes. During this time on 3L O2 without CPAP, minimum O2 sat was 86%. - The patient snored with soft snoring volume during this titration study. - No cardiac abnormalities were observed during this study. - Limb movement total 532 ( 169.5/hr). Limb movement with arousal/ awakening total 37 (11.8/hr). - During one 30 second epoch in REM, patient raised arms up and down, technically REM Behavior Disorder, but not sustained or repeated.  DIAGNOSIS - Obstructive Sleep Apnea (G47.33) - Periodic Limb Movement - Nocturnal Hypoxemia  RECOMMENDATIONS - Trial of CPAP therapy on 11 cm H2O or autoap 5-15. - Patient wore a Small-Medium size [employer] Full Face Evora Full mask and heated humidification. - Efforts to manage complaints of dryness will be helpful. - Continue  supplemental O2. - Sleep hygiene should be reviewed to assess factors that may improve sleep quality. - Weight management and regular exercise should be initiated or continued. - Manage for Periodic Limb Movement.  [Electronically signed]  11/22/2023 12:27 PM  Reggy Salt MD, ABSM Diplomate, American Board of Sleep Medicine NPI: [id number]                        Reggy Salt Diplomate, American Board of Sleep Medicine  ELECTRONICALLY SIGNED ON:  11/22/2023, 12:11 PM [facility] PH: [phone]   FX: [phone] ACCREDITED BY THE AMERICAN ACADEMY OF SLEEP MEDICINE

## 2023-11-24 ENCOUNTER — Ambulatory Visit: Payer: Medicare Other | Admitting: Family Medicine

## 2023-11-24 DIAGNOSIS — D631 Anemia in chronic kidney disease: Secondary | ICD-10-CM | POA: Diagnosis not present

## 2023-11-24 DIAGNOSIS — J449 Chronic obstructive pulmonary disease, unspecified: Secondary | ICD-10-CM | POA: Diagnosis not present

## 2023-11-24 DIAGNOSIS — I120 Hypertensive chronic kidney disease with stage 5 chronic kidney disease or end stage renal disease: Secondary | ICD-10-CM | POA: Diagnosis not present

## 2023-11-24 DIAGNOSIS — R1312 Dysphagia, oropharyngeal phase: Secondary | ICD-10-CM | POA: Diagnosis not present

## 2023-11-24 DIAGNOSIS — J9621 Acute and chronic respiratory failure with hypoxia: Secondary | ICD-10-CM | POA: Diagnosis not present

## 2023-11-24 DIAGNOSIS — N186 End stage renal disease: Secondary | ICD-10-CM | POA: Diagnosis not present

## 2023-11-25 DIAGNOSIS — Z992 Dependence on renal dialysis: Secondary | ICD-10-CM | POA: Diagnosis not present

## 2023-11-25 DIAGNOSIS — D631 Anemia in chronic kidney disease: Secondary | ICD-10-CM | POA: Diagnosis not present

## 2023-11-25 DIAGNOSIS — N2581 Secondary hyperparathyroidism of renal origin: Secondary | ICD-10-CM | POA: Diagnosis not present

## 2023-11-25 DIAGNOSIS — N186 End stage renal disease: Secondary | ICD-10-CM | POA: Diagnosis not present

## 2023-11-25 DIAGNOSIS — D509 Iron deficiency anemia, unspecified: Secondary | ICD-10-CM | POA: Diagnosis not present

## 2023-11-26 DIAGNOSIS — Z85828 Personal history of other malignant neoplasm of skin: Secondary | ICD-10-CM | POA: Diagnosis not present

## 2023-11-26 DIAGNOSIS — C44311 Basal cell carcinoma of skin of nose: Secondary | ICD-10-CM | POA: Diagnosis not present

## 2023-11-27 ENCOUNTER — Telehealth: Payer: Self-pay | Admitting: Primary Care

## 2023-11-27 DIAGNOSIS — Z992 Dependence on renal dialysis: Secondary | ICD-10-CM | POA: Diagnosis not present

## 2023-11-27 DIAGNOSIS — D509 Iron deficiency anemia, unspecified: Secondary | ICD-10-CM | POA: Diagnosis not present

## 2023-11-27 DIAGNOSIS — N2581 Secondary hyperparathyroidism of renal origin: Secondary | ICD-10-CM | POA: Diagnosis not present

## 2023-11-27 DIAGNOSIS — N186 End stage renal disease: Secondary | ICD-10-CM | POA: Diagnosis not present

## 2023-11-27 DIAGNOSIS — D631 Anemia in chronic kidney disease: Secondary | ICD-10-CM | POA: Diagnosis not present

## 2023-11-27 NOTE — Telephone Encounter (Signed)
-----   Message from [place] sent at 11/22/2023 12:55 PM EST ----- Waynetta Sandy- If you look at Mr Theard's sleep study Documents> Tech Notes> Shift Summary, the tech gave a really good description of her efforts with Mr Holubec and his problem with dryness. -CDY

## 2023-11-29 DIAGNOSIS — Z992 Dependence on renal dialysis: Secondary | ICD-10-CM | POA: Diagnosis not present

## 2023-11-29 DIAGNOSIS — N2581 Secondary hyperparathyroidism of renal origin: Secondary | ICD-10-CM | POA: Diagnosis not present

## 2023-11-29 DIAGNOSIS — N186 End stage renal disease: Secondary | ICD-10-CM | POA: Diagnosis not present

## 2023-11-29 DIAGNOSIS — D509 Iron deficiency anemia, unspecified: Secondary | ICD-10-CM | POA: Diagnosis not present

## 2023-11-29 DIAGNOSIS — D631 Anemia in chronic kidney disease: Secondary | ICD-10-CM | POA: Diagnosis not present

## 2023-12-01 ENCOUNTER — Encounter: Payer: Self-pay | Admitting: Family Medicine

## 2023-12-01 ENCOUNTER — Ambulatory Visit (INDEPENDENT_AMBULATORY_CARE_PROVIDER_SITE_OTHER): Payer: Medicare Other | Admitting: Family Medicine

## 2023-12-01 VITALS — BP 132/78 | HR 69

## 2023-12-01 DIAGNOSIS — I10 Essential (primary) hypertension: Secondary | ICD-10-CM | POA: Diagnosis not present

## 2023-12-01 DIAGNOSIS — R0902 Hypoxemia: Secondary | ICD-10-CM | POA: Diagnosis not present

## 2023-12-01 MED ORDER — METOPROLOL TARTRATE 25 MG PO TABS: 25.0000 mg | ORAL_TABLET | Freq: Two times a day (BID) | ORAL | 3 refills | Status: DC

## 2023-12-01 NOTE — Progress Notes (Signed)
Jeremy [name] - 83 y.o. male MRN [id number]  Date of birth: July 05, 1941  Subjective Chief Complaint  Patient presents with   COPD         HPI Jeremy [name] is a 83 y.o. male here today for follow up visit.   He has had aspiration pneumonia and COPD exacerbation in the fall.  Has remained on oxygen since that time.  We did try to get him a portable oxygen concentrator however he could not use pulsed therapy due to mouth breathing.  It has been difficult to get him a continuous generator.  He has seen pulmonology as well has been working to get him this concentrator.  He does have a repeat swallow study coming up.  He does need his metoprolol updated to immediate release so that this can be crushed.  ROS:  A comprehensive ROS was completed and negative except as noted per HPI  Allergies  Allergen Reactions   Sulfa Antibiotics     Rash, light headed, shaking   Penicillins Other (See Comments)    hives severe as a child    Tape     Pulls skin off    Past Medical History:  Diagnosis Date   Anemia    Cancer (HCC)    patient and family have not been told that he has cancer.   Chronic kidney disease    dialysis T/Th/Sa   COPD (chronic obstructive pulmonary disease) (HCC)    Enlarged prostate    History of blood transfusion    Hyperlipidemia    Hypertension    Self-catheterizes urinary bladder    pt. does this morning and evening--been doing this since 11/2016   Sleep apnea    not using a cpap machine    Past Surgical History:  Procedure Laterality Date   A/V FISTULAGRAM Left 03/12/2023   Procedure: A/V Fistulagram;  Surgeon: Victorino Sparrow, MD;  Location: [facility] INVASIVE CV LAB;  Service: Cardiovascular;  Laterality: Left;   A/V FISTULAGRAM Left 10/31/2023   Procedure: A/V Fistulagram;  Surgeon: Ethelene Hal, MD;  Location[facility] INVASIVE CV LAB;  Service: Cardiovascular;  Laterality: Left;   AV FISTULA PLACEMENT Left 10/16/2022   Procedure: LEFT BRACHIOCEPHALIC ARTERIOVENOUS (AV)  FISTULA CREATION;  Surgeon: Cephus Shelling, MD;  Location: [facility] OR;  Service: Vascular;  Laterality: Left;   BACK SURGERY     BLADDER TUMOR EXCISION  2015   benign   BLEPHAROPLASTY  12/2018   LIGATION OF COMPETING BRANCHES OF ARTERIOVENOUS FISTULA Left 03/17/2023   Procedure: LEFT ARM FISTULA BRANCH LIGATION;  Surgeon: Victorino Sparrow, MD;  Location: [facility] OR;  Service: Vascular;  Laterality: Left;   LUMBAR LAMINECTOMY/DECOMPRESSION MICRODISCECTOMY Bilateral 08/25/2017   Procedure: Bilateral Lumbar Three- Four, Lumbar Four- Five, Lumbar Five- Sacral One Laminectomy;  Surgeon: Barnett Abu, MD;  Location: [facility] OR;  Service: Neurosurgery;  Laterality: Bilateral;  Bilateral L3-4 L4-5 L5-S1 Laminectomy   SKIN CANCER EXCISION     Surg x2...   TONSILLECTOMY AND ADENOIDECTOMY     Surg as a child...   TRANSURETHRAL RESECTION OF BLADDER TUMOR N/A 01/03/2022   Procedure: TRANSURETHRAL RESECTION OF BLADDER TUMOR (TURBT)/ CYSTOSCOPY/ EXAM UNDER ANESTHESIA, BILATERAL RETROGRADE/ BLADDER BIOPSIES;  Surgeon: Heloise Purpura, MD;  Location: [facility] ORS;  Service: Urology;  Laterality: N/A;  GENERAL ANESTHESIA WITH PARALYSIS    Social History   Socioeconomic History   Marital status: Married    Spouse name: Not on file   Number of children: Not on file  Years of education: Not on file   Highest education level: Not on file  Occupational History   Not on file  Tobacco Use   Smoking status: Former    Current packs/day: 0.00    Average packs/day: 0.2 packs/day for 61.5 years (12.3 ttl pk-yrs)    Types: Cigarettes    Start date: 12/29/1961    Quit date: 07/2023    Years since quitting: 0.3    Passive exposure: Never   Smokeless tobacco: Never   Tobacco comments:    Smokes 1/2 pack a day     Pt states he quit smoking 2 months ago. AB, CMA 10-07-23  Vaping Use   Vaping status: Never Used  Substance and Sexual Activity   Alcohol use: Not Currently    Comment: couple glasses with dinner daily   Drug use:  No   Sexual activity: Not on file  Other Topics Concern   Not on file  Social History Narrative   Lives with wife in a 2 story home.  Has 2 children.  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)   Social Drivers of Health   Financial Resource Strain: Not on file  Food Insecurity: No Food Insecurity (08/14/2023)   Hunger Vital Sign    Worried About Running Out of Food in the Last Year: Never true    Ran Out of Food in the Last Year: Never true  Transportation Needs: No Transportation Needs (08/14/2023)   PRAPARE - Administrator, Civil Service (Medical): No    Lack of Transportation (Non-Medical): No  Physical Activity: Not on file  Stress: Not on file  Social Connections: Not on file    Family History  Problem Relation Age of Onset   Colon cancer Neg Hx    Stomach cancer Neg Hx    Rectal cancer Neg Hx     Health Maintenance  Topic Date Due   Medicare Annual Wellness (AWV)  Never done   Zoster Vaccines- Shingrix (1 of 2) 05/29/1959   COVID-19 Vaccine (5 - 2024-25 season) 07/13/2023   DTaP/Tdap/Td (3 - Td or Tdap) 07/04/2031   Pneumonia Vaccine 77+ Years old  Completed   INFLUENZA VACCINE  Completed   HPV VACCINES  Aged Out     ----------------------------------------------------------------------------------------------------------------------------------------------------------------------------------------------------------------- Physical Exam BP 132/78   Pulse 69   SpO2 100% Comment: 3L  Physical Exam Constitutional:      Appearance: Normal appearance.  HENT:     Head: Normocephalic and atraumatic.  Eyes:     General: No scleral icterus. Cardiovascular:     Rate and Rhythm: Normal rate and regular rhythm.  Pulmonary:     Effort: Pulmonary effort is normal.     Breath sounds: Normal breath sounds.  Musculoskeletal:     Cervical back: Neck supple.  Neurological:     Mental Status: He is alert.  Psychiatric:        Mood and Affect: Mood  normal.        Behavior: Behavior normal.     ------------------------------------------------------------------------------------------------------------------------------------------------------------------------------------------------------------------- Assessment and Plan  Essential hypertension Blood pressure mains well-controlled.  Will switch metoprolol to immediate release at 25 mg twice per day so that this can be crushed into thickened liquids.  Hypoxia He had aspiration pneumonia in the fall.  He remains on chronic O2 at 3 L/min.  He has been followed by pulmonology. In the process of trying to get a more portable solution for continuous O2   Meds ordered this encounter  Medications  metoprolol tartrate (LOPRESSOR) 25 MG tablet    Sig: Take 1 tablet (25 mg total) by mouth 2 (two) times daily.    Dispense:  180 tablet    Refill:  3    Return in about 4 months (around 03/30/2024) for COPD.    This visit occurred during the SARS-CoV-2 public health emergency.  Safety protocols were in place, including screening questions prior to the visit, additional usage of staff PPE, and extensive cleaning of exam room while observing appropriate contact time as indicated for disinfecting solutions.

## 2023-12-01 NOTE — Assessment & Plan Note (Signed)
Blood pressure mains well-controlled.  Will switch metoprolol to immediate release at 25 mg twice per day so that this can be crushed into thickened liquids.

## 2023-12-01 NOTE — Assessment & Plan Note (Signed)
He had aspiration pneumonia in the fall.  He remains on chronic O2 at 3 L/min.  He has been followed by pulmonology. In the process of trying to get a more portable solution for continuous O2

## 2023-12-02 DIAGNOSIS — N186 End stage renal disease: Secondary | ICD-10-CM | POA: Diagnosis not present

## 2023-12-02 DIAGNOSIS — D631 Anemia in chronic kidney disease: Secondary | ICD-10-CM | POA: Diagnosis not present

## 2023-12-02 DIAGNOSIS — Z992 Dependence on renal dialysis: Secondary | ICD-10-CM | POA: Diagnosis not present

## 2023-12-02 DIAGNOSIS — N2581 Secondary hyperparathyroidism of renal origin: Secondary | ICD-10-CM | POA: Diagnosis not present

## 2023-12-02 DIAGNOSIS — D509 Iron deficiency anemia, unspecified: Secondary | ICD-10-CM | POA: Diagnosis not present

## 2023-12-04 DIAGNOSIS — N186 End stage renal disease: Secondary | ICD-10-CM | POA: Diagnosis not present

## 2023-12-04 DIAGNOSIS — N2581 Secondary hyperparathyroidism of renal origin: Secondary | ICD-10-CM | POA: Diagnosis not present

## 2023-12-04 DIAGNOSIS — Z992 Dependence on renal dialysis: Secondary | ICD-10-CM | POA: Diagnosis not present

## 2023-12-04 DIAGNOSIS — D509 Iron deficiency anemia, unspecified: Secondary | ICD-10-CM | POA: Diagnosis not present

## 2023-12-04 DIAGNOSIS — D631 Anemia in chronic kidney disease: Secondary | ICD-10-CM | POA: Diagnosis not present

## 2023-12-06 DIAGNOSIS — Z992 Dependence on renal dialysis: Secondary | ICD-10-CM | POA: Diagnosis not present

## 2023-12-06 DIAGNOSIS — D631 Anemia in chronic kidney disease: Secondary | ICD-10-CM | POA: Diagnosis not present

## 2023-12-06 DIAGNOSIS — N2581 Secondary hyperparathyroidism of renal origin: Secondary | ICD-10-CM | POA: Diagnosis not present

## 2023-12-06 DIAGNOSIS — D509 Iron deficiency anemia, unspecified: Secondary | ICD-10-CM | POA: Diagnosis not present

## 2023-12-06 DIAGNOSIS — N186 End stage renal disease: Secondary | ICD-10-CM | POA: Diagnosis not present

## 2023-12-08 ENCOUNTER — Ambulatory Visit (HOSPITAL_COMMUNITY)
Admission: RE | Admit: 2023-12-08 | Discharge: 2023-12-08 | Disposition: A | Discharge status: Home / Self Care | Payer: Medicare Other | Source: Ambulatory Visit | Attending: Family Medicine | Admitting: Family Medicine

## 2023-12-08 ENCOUNTER — Telehealth: Payer: Self-pay | Admitting: Primary Care

## 2023-12-08 DIAGNOSIS — D631 Anemia in chronic kidney disease: Secondary | ICD-10-CM | POA: Diagnosis not present

## 2023-12-08 DIAGNOSIS — R059 Cough, unspecified: Secondary | ICD-10-CM

## 2023-12-08 DIAGNOSIS — Z87440 Personal history of urinary (tract) infections: Secondary | ICD-10-CM | POA: Diagnosis not present

## 2023-12-08 DIAGNOSIS — R1310 Dysphagia, unspecified: Secondary | ICD-10-CM | POA: Diagnosis not present

## 2023-12-08 DIAGNOSIS — Z992 Dependence on renal dialysis: Secondary | ICD-10-CM | POA: Diagnosis not present

## 2023-12-08 DIAGNOSIS — G9341 Metabolic encephalopathy: Secondary | ICD-10-CM | POA: Diagnosis not present

## 2023-12-08 DIAGNOSIS — R0902 Hypoxemia: Secondary | ICD-10-CM

## 2023-12-08 DIAGNOSIS — E871 Hypo-osmolality and hyponatremia: Secondary | ICD-10-CM | POA: Diagnosis not present

## 2023-12-08 DIAGNOSIS — R41841 Cognitive communication deficit: Secondary | ICD-10-CM | POA: Diagnosis not present

## 2023-12-08 DIAGNOSIS — N186 End stage renal disease: Secondary | ICD-10-CM | POA: Diagnosis not present

## 2023-12-08 DIAGNOSIS — J449 Chronic obstructive pulmonary disease, unspecified: Secondary | ICD-10-CM | POA: Insufficient documentation

## 2023-12-08 DIAGNOSIS — M4696 Unspecified inflammatory spondylopathy, lumbar region: Secondary | ICD-10-CM | POA: Diagnosis not present

## 2023-12-08 DIAGNOSIS — I120 Hypertensive chronic kidney disease with stage 5 chronic kidney disease or end stage renal disease: Secondary | ICD-10-CM | POA: Diagnosis not present

## 2023-12-08 DIAGNOSIS — J9621 Acute and chronic respiratory failure with hypoxia: Secondary | ICD-10-CM | POA: Diagnosis not present

## 2023-12-08 DIAGNOSIS — E440 Moderate protein-calorie malnutrition: Secondary | ICD-10-CM

## 2023-12-08 DIAGNOSIS — Z8701 Personal history of pneumonia (recurrent): Secondary | ICD-10-CM | POA: Diagnosis not present

## 2023-12-08 DIAGNOSIS — K219 Gastro-esophageal reflux disease without esophagitis: Secondary | ICD-10-CM | POA: Insufficient documentation

## 2023-12-08 DIAGNOSIS — R1312 Dysphagia, oropharyngeal phase: Secondary | ICD-10-CM | POA: Diagnosis not present

## 2023-12-08 DIAGNOSIS — E875 Hyperkalemia: Secondary | ICD-10-CM | POA: Diagnosis not present

## 2023-12-08 DIAGNOSIS — J411 Mucopurulent chronic bronchitis: Secondary | ICD-10-CM

## 2023-12-08 DIAGNOSIS — N400 Enlarged prostate without lower urinary tract symptoms: Secondary | ICD-10-CM | POA: Diagnosis not present

## 2023-12-08 DIAGNOSIS — F5101 Primary insomnia: Secondary | ICD-10-CM | POA: Diagnosis not present

## 2023-12-08 DIAGNOSIS — J9622 Acute and chronic respiratory failure with hypercapnia: Secondary | ICD-10-CM | POA: Diagnosis not present

## 2023-12-08 DIAGNOSIS — M48061 Spinal stenosis, lumbar region without neurogenic claudication: Secondary | ICD-10-CM | POA: Diagnosis not present

## 2023-12-08 DIAGNOSIS — Z9981 Dependence on supplemental oxygen: Secondary | ICD-10-CM | POA: Diagnosis not present

## 2023-12-08 DIAGNOSIS — G4733 Obstructive sleep apnea (adult) (pediatric): Secondary | ICD-10-CM | POA: Diagnosis not present

## 2023-12-08 DIAGNOSIS — R339 Retention of urine, unspecified: Secondary | ICD-10-CM | POA: Diagnosis not present

## 2023-12-08 DIAGNOSIS — F17210 Nicotine dependence, cigarettes, uncomplicated: Secondary | ICD-10-CM | POA: Diagnosis not present

## 2023-12-08 DIAGNOSIS — Z466 Encounter for fitting and adjustment of urinary device: Secondary | ICD-10-CM | POA: Diagnosis not present

## 2023-12-08 NOTE — Telephone Encounter (Signed)
PT send the following [facility] message in:  My nebulizer was not working and [employer] replaced it with Proactive . The instruction manual says not to use with oxygen . I called [employer] they won't get back to me for 24 to 48 hours. Manufacturer said to call Dr. about using it with oxygen. Cam is supposed to have two treatments tonight and three tomorrow morning. Any suggestions? Please call me at [phone] asap.
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
## 2023-12-08 NOTE — Therapy (Signed)
Modified Barium Swallow Study  Patient Details  Name: MACAI SISNEROS MRN: [id number] Date of Birth: 05/16/1941  Today's Date: 12/08/2023  Modified Barium Swallow completed.  Full report located under Chart Review in the Imaging Section.  History of Present Illness Toshio Slusher is an 83 y.o. male with PMH: COPD, tobacco use, (h/o 1ppd) CKD, anemia, sleep apnea, HTN, HLD, dysphagia, ESRD on HD. Inpatient MBS completed on 10/2 reported moderate oropharyngeal dysphagia with aspiration or thin liquids, deep penetration with nectar thick and honey thick liquids. He was discharged to SNF on 10/8 and recently discharged home. He had an OP MBS on 09/15/23 and subsequently has been getting home health SLP intervention for dysphagia. He is here for repeat OP as recommended by his [facility] SLP to determine improvement in swallow function.   Clinical Impression Mr. Biernat presents with improvement in swallow function as compared to MBS on 09/15/23. His wife did report that they have a new nebulizer machine and it has done a much better job of clearing secretions than the previous one. During today's MBS, patient exhibited improved pharyngeal sensation as evidenced by spontaneous throat clearing when barium visualized in pharynx and in or near laryngeal vestibule and overall improved protective cough response. Patient exhibited silent aspiration (PAS 8) of nectar thick liquids during initial few sips and one instance of questionable silent aspiration with thin liquids (shoulder obscured view during at times). Subsequent sips of nectar thick liquids did not result in aspiration. Swallow initiation continues to be delayed to level of pyriform sinus with liquids and to level of vallecular sinus with puree solids. Flash penetration (PAS 2) occured one time with honey thick liquids. With small spoon sips of thin liquids, patient exhibited penetration but no aspiration. Patient continues with reduced epiglottic inversion secondary  to epiglottis coming into contact with posterior pharyngeal wall during swallow. SLP recommending continue mechanical soft solids, advance liquids to nectar thick and allow patient to have thin liquids via spoon sip only. SLP discussed that patient will continue to be at risk of aspiration but following precautions will help to reduce this risk. SLP also recommending patient continue with [facility] SLP services. Factors that may increase risk of adverse event in presence of aspiration Rubye Oaks [employer] Clearance Coots 2021): Poor general health and/or compromised immunity;Limited mobility;Frail or deconditioned  Swallow Evaluation Recommendations Recommendations: PO diet PO Diet Recommendation: Dysphagia 3 (Mechanical soft);Mildly thick liquids (Level 2, nectar thick) Liquid Administration via: Cup;No straw Swallowing strategies  : Small bites/sips;Multiple dry swallows after each bite/sip;Clear throat intermittently Postural changes: Position pt fully upright for meals   Angela Nevin, MA, CCC-SLP Speech Therapy

## 2023-12-09 DIAGNOSIS — N2581 Secondary hyperparathyroidism of renal origin: Secondary | ICD-10-CM | POA: Diagnosis not present

## 2023-12-09 DIAGNOSIS — D631 Anemia in chronic kidney disease: Secondary | ICD-10-CM | POA: Diagnosis not present

## 2023-12-09 DIAGNOSIS — Z992 Dependence on renal dialysis: Secondary | ICD-10-CM | POA: Diagnosis not present

## 2023-12-09 DIAGNOSIS — N186 End stage renal disease: Secondary | ICD-10-CM | POA: Diagnosis not present

## 2023-12-09 DIAGNOSIS — D509 Iron deficiency anemia, unspecified: Secondary | ICD-10-CM | POA: Diagnosis not present

## 2023-12-10 ENCOUNTER — Ambulatory Visit (INDEPENDENT_AMBULATORY_CARE_PROVIDER_SITE_OTHER): Payer: Medicare Other | Admitting: Podiatry

## 2023-12-10 ENCOUNTER — Encounter: Payer: Self-pay | Admitting: Podiatry

## 2023-12-10 DIAGNOSIS — N186 End stage renal disease: Secondary | ICD-10-CM | POA: Diagnosis not present

## 2023-12-10 DIAGNOSIS — J9621 Acute and chronic respiratory failure with hypoxia: Secondary | ICD-10-CM | POA: Diagnosis not present

## 2023-12-10 DIAGNOSIS — I120 Hypertensive chronic kidney disease with stage 5 chronic kidney disease or end stage renal disease: Secondary | ICD-10-CM | POA: Diagnosis not present

## 2023-12-10 DIAGNOSIS — B351 Tinea unguium: Secondary | ICD-10-CM

## 2023-12-10 DIAGNOSIS — J449 Chronic obstructive pulmonary disease, unspecified: Secondary | ICD-10-CM | POA: Diagnosis not present

## 2023-12-10 DIAGNOSIS — R1312 Dysphagia, oropharyngeal phase: Secondary | ICD-10-CM | POA: Diagnosis not present

## 2023-12-10 DIAGNOSIS — D631 Anemia in chronic kidney disease: Secondary | ICD-10-CM | POA: Diagnosis not present

## 2023-12-10 NOTE — Progress Notes (Signed)
This patient returns to my office for at risk foot care.  This patient requires this care by a professional since this patient will be at risk due to having ESRD.  This patient is unable to cut nails himself since the patient cannot reach his nails.These nails are painful walking and wearing shoes.  This patient presents for at risk foot care today.  General Appearance  Alert, conversant and in no acute stress.  Vascular  Dorsalis pedis and posterior tibial  pulses are weakly  palpable  bilaterally.  Capillary return is within normal limits  bilaterally. Temperature is within normal limits  bilaterally.  Neurologic  Senn-Weinstein monofilament wire test within normal limits  bilaterally. Muscle power within normal limits bilaterally.  Nails Thick disfigured discolored nails with subungual debris  from hallux to fifth toes bilaterally. No evidence of bacterial infection or drainage bilaterally.  Orthopedic  No limitations of motion  feet .  No crepitus or effusions noted.  No bony pathology or digital deformities noted.  Skin  normotropic skin with no porokeratosis noted bilaterally.  No signs of infections or ulcers noted.     Onychomycosis  Pain in right toes  Pain in left toes  Consent was obtained for treatment procedures.   Mechanical debridement of nails 1-5  bilaterally performed with a nail nipper.  Filed with dremel without incident.    Return office visit    10 weeks                  Told patient to return for periodic foot care and evaluation due to potential at risk complications.   Helane Gunther DPM

## 2023-12-11 DIAGNOSIS — N186 End stage renal disease: Secondary | ICD-10-CM | POA: Diagnosis not present

## 2023-12-11 DIAGNOSIS — D631 Anemia in chronic kidney disease: Secondary | ICD-10-CM | POA: Diagnosis not present

## 2023-12-11 DIAGNOSIS — Z992 Dependence on renal dialysis: Secondary | ICD-10-CM | POA: Diagnosis not present

## 2023-12-11 DIAGNOSIS — N2581 Secondary hyperparathyroidism of renal origin: Secondary | ICD-10-CM | POA: Diagnosis not present

## 2023-12-11 DIAGNOSIS — D509 Iron deficiency anemia, unspecified: Secondary | ICD-10-CM | POA: Diagnosis not present
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
## 2023-12-13 DIAGNOSIS — N2581 Secondary hyperparathyroidism of renal origin: Secondary | ICD-10-CM | POA: Diagnosis not present

## 2023-12-13 DIAGNOSIS — I129 Hypertensive chronic kidney disease with stage 1 through stage 4 chronic kidney disease, or unspecified chronic kidney disease: Secondary | ICD-10-CM | POA: Diagnosis not present

## 2023-12-13 DIAGNOSIS — D509 Iron deficiency anemia, unspecified: Secondary | ICD-10-CM | POA: Diagnosis not present

## 2023-12-13 DIAGNOSIS — Z992 Dependence on renal dialysis: Secondary | ICD-10-CM | POA: Diagnosis not present

## 2023-12-13 DIAGNOSIS — N186 End stage renal disease: Secondary | ICD-10-CM | POA: Diagnosis not present

## 2023-12-13 DIAGNOSIS — D631 Anemia in chronic kidney disease: Secondary | ICD-10-CM | POA: Diagnosis not present

## 2023-12-16 ENCOUNTER — Ambulatory Visit: Payer: Self-pay | Admitting: Family Medicine

## 2023-12-16 DIAGNOSIS — N186 End stage renal disease: Secondary | ICD-10-CM | POA: Diagnosis not present

## 2023-12-16 DIAGNOSIS — D631 Anemia in chronic kidney disease: Secondary | ICD-10-CM | POA: Diagnosis not present

## 2023-12-16 DIAGNOSIS — Z992 Dependence on renal dialysis: Secondary | ICD-10-CM | POA: Diagnosis not present

## 2023-12-16 DIAGNOSIS — D509 Iron deficiency anemia, unspecified: Secondary | ICD-10-CM | POA: Diagnosis not present

## 2023-12-16 DIAGNOSIS — N2581 Secondary hyperparathyroidism of renal origin: Secondary | ICD-10-CM | POA: Diagnosis not present

## 2023-12-16 NOTE — Telephone Encounter (Signed)
 Copied from CRM [id number]. Topic: Clinical - Red Word Triage >> Dec 16, 2023  4:44 PM Elle L wrote: Red Word that prompted transfer to Nurse Triage: The patient left dialysis and his oxygen  is dropping down to the 80's and the patient's wife, Jeremy [name], is concerned that their is fluid in his lungs.   Chief Complaint: Hypoxia  Symptoms: Low oxygen  level  Frequency: Today Pertinent Negatives: Patient denies shortness of breath  Disposition: [x] ED /[] Urgent Care (no appt availability in office) / [] Appointment(In office/virtual)/ []  [facility]/ [] Home Care/ [] Refused Recommended Disposition /[] [facility]/ []  Follow-up with PCP Additional Notes: Patient's wife and caregiver called to report that after dialysis today the patient's oxygen  level dropped to the 80's on his normal 3L. She states that it has improved to the 90's on 4L. She reports that while at dialysis a nurse mentioned that it sounded like he had fluid on his lungs and was concerned because this is similar to the last time that he had pneumonia. They report that dialysis told them that he had 6 kg of extra fluid at dialysis and had to adjust his dialysis. I advised that due to the low oxygen  level and what the nurse at dialysis told them that the patient should go to the ED for evaluation and treatment. They verbalized understanding and agreement of this plan.    Reason for Disposition  Oxygen  level (e.g., pulse oximetry) 90 percent or lower  Answer Assessment - Initial Assessment Questions 1. MAIN CONCERN OR SYMPTOM: What's your main concern? (e.g., low oxygen  level, breathing difficulty) What question do you have?     Low oxygen  level, in the 80's 2. ONSET: When did the low oxygen  level start?      Today  3. OXYGEN  THERAPY:    - Do you currently use home oxygen ?    - If Yes, ask: What is your oxygen  source? (e.g., O2 tank, O2 concentrator).    - If Yes, ask: How do you get the oxygen ? (e.g.,  nasal prongs, face mask).    - If Yes, ask: How much oxygen  are you supposed to use? (e.g., 1-2 L [place])     3L, currently on 4L 4.  OXYGEN  EQUIPMENT:  Are you having any trouble with your oxygen  equipment?  (e.g., cannula, mask, tubing, tank, concentrator)     No 5. OXYGEN  SATURATION MONITOR:     - Do you use an oxygen  saturation monitor (pulse oximeter) at home?    - If Yes, ask: Where do you place the probe? (e.g., fingertip, ear lobe)     Fingertip monitor  6. OXYGEN  LEVEL: What is your reading (oxygen  level) today? What is your usual oxygen  saturation reading? (e.g., 95%)     80's, usually in the 94-97% on 3L. 7. VSS MONITORING: Do you monitor/measure your oxygen  level or vital signs? (e.g., yes, no, measurements are automatically sent to provider/call center). Document CURRENT and NORMAL BASELINE values if available.     -  P: What is your pulse rate per minute?   -  RR: What is your respiratory rate per minute?     50's to 70's 8. BREATHING DIFFICULTY: Are you having any difficulty breathing? If Yes, ask: How bad is it?  (e.g., none, mild, moderate, severe)    - MILD: No SOB at rest, mild SOB with walking, speaks normally in sentences, able to lie down, no retractions, pulse < 100.    - MODERATE: SOB at rest,  SOB with minimal exertion and prefers to sit, cannot lie down flat, speaks in phrases, mild retractions, audible wheezing, pulse 100-120.    - SEVERE: Very SOB at rest, speaks in single words, struggling to breathe, sitting hunched forward, retractions, pulse > 120      No shortness of breath  9. OTHER SYMPTOMS: Do you have any other symptoms? (e.g., fever, change in sputum)     Blood in urine, huffing and puffing yesterday  Protocols used: Oxygen  Monitoring and Hypoxia-A-AH

## 2023-12-17 ENCOUNTER — Ambulatory Visit: Payer: Self-pay | Admitting: Family Medicine

## 2023-12-17 ENCOUNTER — Telehealth: Payer: Self-pay | Admitting: Primary Care

## 2023-12-17 DIAGNOSIS — J411 Mucopurulent chronic bronchitis: Secondary | ICD-10-CM

## 2023-12-17 DIAGNOSIS — R0902 Hypoxemia: Secondary | ICD-10-CM

## 2023-12-17 NOTE — Telephone Encounter (Signed)
 Received verbal from patient to speak with care giver, Odean.  Spoke to Kat. She stated that EMS was called to patient's home 2/4. EMS said patient lungs were clear, however spO2 dropped into the mid 70's on 3L. Odean felt that this reading was not accurate due to pt's fingers being cold.  Pt refused ED at that time. Dialysis three times weekly. Last tx yesterday. RN felt that pt did have some fluid overload.  Spo2 today is mid 90's on 3L and HR 76 per Kat.  Per Odean, patient is not experiencing any SOB, cough, wheezing, f/c/s or additional sx at this time.   Odean just wanted Beth to be aware of this. Patient is scheduled appt 2/26.  Beth, please advise if pt should be seen sooner. Thanks

## 2023-12-17 NOTE — Telephone Encounter (Signed)
 No indication to be seen sooner unless he becomes symptomatic or O2 levels consistently low

## 2023-12-17 NOTE — Telephone Encounter (Signed)
 Kat calling on behalf of spouse who verbalized okay to speak. Call back number is for spouse. States patient began last Thursday that his heart rate jumped to 120, 02 in the 70s. On Monday, his urine was red. EMT visited patient evening of 2/4 and stated his lungs were clear but his 02 dropped to 70s again even on 3/4liters. Patient did not want to go to ED. No acutes available at time of call. Wanted to discuss with nurse.

## 2023-12-17 NOTE — Telephone Encounter (Signed)
 Jeremy [name] is aware of below message and voiced her understanding.  Nothing further needed.

## 2023-12-17 NOTE — Telephone Encounter (Signed)
  Chief Complaint: advice only Symptoms: blood in urine, low o2 sats Frequency: yesterday  Disposition: [x] ED /[] Urgent Care (no appt availability in office) / [] Appointment(In office/virtual)/ []  [facility]/ [] Home Care/ [x] Refused Recommended Disposition /[] [facility]/ []  Follow-up with PCP Additional Notes:  Patients wife called back stating that they called EMS to their home last night at the recommendation of yesterdays triage nurse and EMS did not believe patient needed to go to the ED. Wife states one of the medics said his lungs sounded fine, the other said he might've heard something, but they didn't think we needed to go to the ED. Wife verifies patients symptoms from yesterday's triage stating that patient is typically on 3L and his o2 intermittently dropped to 80, when he is usually around 94% on 3L. Wife also states patient was having cranberry colored urine when self-cathing. This RN reiterated ED recommendation per triage yesterday. Wife states she does not want to bring patient to the ED because he might catch something and we would have to wait for hours. Wife did verbalize concern that patient may have pneumonia as this is how she states it started last time he had it. Wife is requesting Dr. Alvia order a urine sample, blood work, and a chest x-ray to rule out pneumonia, so they do not have to go to the ED. This RN advised she would send over request but cannot promise anything, as ED is being recommended for patient's symptoms. Wife verbalized understanding.     Copied from CRM [id number]. Topic: Clinical - Medical Advice >> Dec 17, 2023 11:20 AM Tonda B wrote: Reason for CRM: patient wants a nurse to call because his oxygen  levels are going low and talk to a nurse with [facility] yesterday but wife wants to speak directly with [facility] nurse patient seems to be doing ok but patients wife have a few questions please call [phone] Reason for Disposition  [1]  Follow-up call to recent contact AND [2] information only call, no triage required  Answer Assessment - Initial Assessment Questions 1. REASON FOR CALL or QUESTION: What is your reason for calling today? or How can I best help you? or What question do you have that I can help answer?     Patients wife called back stating that they called EMS to their home last night and EMS did not believe patient needed to go to the ED. Wife states one of the medics said his lungs sounded fine, the other said he might've heard something, but they didn't think we needed to go to the ED. Wife verifies patients symptoms from yesterday's triage stating that patient is typically on 3L and his o2 dropped to 80, when he is usually around 94% on 3L. Wife also states patient was having cranberry colored urine when self-cathing. This RN reiterated ED recommendation per triage yesterday. Wife states she does not want to bring patient to the ED because he might catch something and we would have to wait for hours. Wife did verbalize concern that patient may have pneumonia as this is how she states it started last time he had it. Wife is requesting Dr. Alvia order a urine sample, blood work, and a chest x-ray to rule out pneumonia, so they do not have to go to the ED. This RN advised she would send over request but cannot promise anything, as ED is being recommended for patient's symptoms. Wife verbalized understanding.  Protocols used: Information Only Call - No Triage-A-AH

## 2023-12-18 DIAGNOSIS — D509 Iron deficiency anemia, unspecified: Secondary | ICD-10-CM | POA: Diagnosis not present

## 2023-12-18 DIAGNOSIS — D631 Anemia in chronic kidney disease: Secondary | ICD-10-CM | POA: Diagnosis not present

## 2023-12-18 DIAGNOSIS — N2581 Secondary hyperparathyroidism of renal origin: Secondary | ICD-10-CM | POA: Diagnosis not present

## 2023-12-18 DIAGNOSIS — N186 End stage renal disease: Secondary | ICD-10-CM | POA: Diagnosis not present

## 2023-12-18 DIAGNOSIS — Z992 Dependence on renal dialysis: Secondary | ICD-10-CM | POA: Diagnosis not present

## 2023-12-18 NOTE — Telephone Encounter (Signed)
 Please see separate message regarding patient.

## 2023-12-18 NOTE — Telephone Encounter (Signed)
 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 She was wanting to know if should get a CXR for patient? Regarding cranberry colored urine , this has resolved. The day after it was found to be dark in color it was back to cloudy. She did call patient urologist who  after speaking with patient was not concerned and did not want him to have too many antibiotics given.  She states she could bring a specimen in for checking if you would like her to.

## 2023-12-18 NOTE — Telephone Encounter (Signed)
 UPCOMING APPT. NFN

## 2023-12-19 ENCOUNTER — Ambulatory Visit: Payer: Medicare Other

## 2023-12-19 DIAGNOSIS — R0902 Hypoxemia: Secondary | ICD-10-CM

## 2023-12-19 DIAGNOSIS — J411 Mucopurulent chronic bronchitis: Secondary | ICD-10-CM

## 2023-12-19 DIAGNOSIS — R0600 Dyspnea, unspecified: Secondary | ICD-10-CM | POA: Diagnosis not present

## 2023-12-19 DIAGNOSIS — J449 Chronic obstructive pulmonary disease, unspecified: Secondary | ICD-10-CM | POA: Diagnosis not present

## 2023-12-19 DIAGNOSIS — R918 Other nonspecific abnormal finding of lung field: Secondary | ICD-10-CM | POA: Diagnosis not present

## 2023-12-19 DIAGNOSIS — I700 Atherosclerosis of aorta: Secondary | ICD-10-CM | POA: Diagnosis not present

## 2023-12-19 NOTE — Telephone Encounter (Signed)
 Patient wife PAT informed and will try to get patient to radiology today to have CXR done .

## 2023-12-19 NOTE — Addendum Note (Signed)
 Addended by: Mekayla Soman E on: 12/19/2023 12:18 PM   Modules accepted: Orders

## 2023-12-19 NOTE — Telephone Encounter (Signed)
  Attemtped call to patient wife PAT. Left a voice mail message requesting a return call.

## 2023-12-20 DIAGNOSIS — D509 Iron deficiency anemia, unspecified: Secondary | ICD-10-CM | POA: Diagnosis not present

## 2023-12-20 DIAGNOSIS — Z992 Dependence on renal dialysis: Secondary | ICD-10-CM | POA: Diagnosis not present

## 2023-12-20 DIAGNOSIS — D631 Anemia in chronic kidney disease: Secondary | ICD-10-CM | POA: Diagnosis not present

## 2023-12-20 DIAGNOSIS — N2581 Secondary hyperparathyroidism of renal origin: Secondary | ICD-10-CM | POA: Diagnosis not present

## 2023-12-20 DIAGNOSIS — N186 End stage renal disease: Secondary | ICD-10-CM | POA: Diagnosis not present

## 2023-12-22 DIAGNOSIS — J449 Chronic obstructive pulmonary disease, unspecified: Secondary | ICD-10-CM | POA: Diagnosis not present

## 2023-12-22 DIAGNOSIS — J9621 Acute and chronic respiratory failure with hypoxia: Secondary | ICD-10-CM | POA: Diagnosis not present

## 2023-12-22 DIAGNOSIS — I120 Hypertensive chronic kidney disease with stage 5 chronic kidney disease or end stage renal disease: Secondary | ICD-10-CM | POA: Diagnosis not present

## 2023-12-22 DIAGNOSIS — R1312 Dysphagia, oropharyngeal phase: Secondary | ICD-10-CM | POA: Diagnosis not present

## 2023-12-22 DIAGNOSIS — D631 Anemia in chronic kidney disease: Secondary | ICD-10-CM | POA: Diagnosis not present

## 2023-12-22 DIAGNOSIS — N186 End stage renal disease: Secondary | ICD-10-CM | POA: Diagnosis not present

## 2023-12-23 DIAGNOSIS — D509 Iron deficiency anemia, unspecified: Secondary | ICD-10-CM | POA: Diagnosis not present

## 2023-12-23 DIAGNOSIS — N186 End stage renal disease: Secondary | ICD-10-CM | POA: Diagnosis not present

## 2023-12-23 DIAGNOSIS — D631 Anemia in chronic kidney disease: Secondary | ICD-10-CM | POA: Diagnosis not present

## 2023-12-23 DIAGNOSIS — Z992 Dependence on renal dialysis: Secondary | ICD-10-CM | POA: Diagnosis not present

## 2023-12-23 DIAGNOSIS — N2581 Secondary hyperparathyroidism of renal origin: Secondary | ICD-10-CM | POA: Diagnosis not present

## 2023-12-25 DIAGNOSIS — D509 Iron deficiency anemia, unspecified: Secondary | ICD-10-CM | POA: Diagnosis not present

## 2023-12-25 DIAGNOSIS — Z992 Dependence on renal dialysis: Secondary | ICD-10-CM | POA: Diagnosis not present

## 2023-12-25 DIAGNOSIS — D631 Anemia in chronic kidney disease: Secondary | ICD-10-CM | POA: Diagnosis not present

## 2023-12-25 DIAGNOSIS — N186 End stage renal disease: Secondary | ICD-10-CM | POA: Diagnosis not present

## 2023-12-25 DIAGNOSIS — N2581 Secondary hyperparathyroidism of renal origin: Secondary | ICD-10-CM | POA: Diagnosis not present

## 2023-12-27 DIAGNOSIS — D631 Anemia in chronic kidney disease: Secondary | ICD-10-CM | POA: Diagnosis not present

## 2023-12-27 DIAGNOSIS — N2581 Secondary hyperparathyroidism of renal origin: Secondary | ICD-10-CM | POA: Diagnosis not present

## 2023-12-27 DIAGNOSIS — N186 End stage renal disease: Secondary | ICD-10-CM | POA: Diagnosis not present

## 2023-12-27 DIAGNOSIS — D509 Iron deficiency anemia, unspecified: Secondary | ICD-10-CM | POA: Diagnosis not present

## 2023-12-27 DIAGNOSIS — Z992 Dependence on renal dialysis: Secondary | ICD-10-CM | POA: Diagnosis not present

## 2023-12-30 DIAGNOSIS — D509 Iron deficiency anemia, unspecified: Secondary | ICD-10-CM | POA: Diagnosis not present

## 2023-12-30 DIAGNOSIS — D631 Anemia in chronic kidney disease: Secondary | ICD-10-CM | POA: Diagnosis not present

## 2023-12-30 DIAGNOSIS — N186 End stage renal disease: Secondary | ICD-10-CM | POA: Diagnosis not present

## 2023-12-30 DIAGNOSIS — Z992 Dependence on renal dialysis: Secondary | ICD-10-CM | POA: Diagnosis not present

## 2023-12-30 DIAGNOSIS — N2581 Secondary hyperparathyroidism of renal origin: Secondary | ICD-10-CM | POA: Diagnosis not present

## 2024-01-01 DIAGNOSIS — D509 Iron deficiency anemia, unspecified: Secondary | ICD-10-CM | POA: Diagnosis not present

## 2024-01-01 DIAGNOSIS — N2581 Secondary hyperparathyroidism of renal origin: Secondary | ICD-10-CM | POA: Diagnosis not present

## 2024-01-01 DIAGNOSIS — N186 End stage renal disease: Secondary | ICD-10-CM | POA: Diagnosis not present

## 2024-01-01 DIAGNOSIS — D631 Anemia in chronic kidney disease: Secondary | ICD-10-CM | POA: Diagnosis not present

## 2024-01-01 DIAGNOSIS — Z992 Dependence on renal dialysis: Secondary | ICD-10-CM | POA: Diagnosis not present

## 2024-01-02 ENCOUNTER — Encounter: Payer: Self-pay | Admitting: Family Medicine

## 2024-01-03 DIAGNOSIS — D509 Iron deficiency anemia, unspecified: Secondary | ICD-10-CM | POA: Diagnosis not present

## 2024-01-03 DIAGNOSIS — N2581 Secondary hyperparathyroidism of renal origin: Secondary | ICD-10-CM | POA: Diagnosis not present

## 2024-01-03 DIAGNOSIS — D631 Anemia in chronic kidney disease: Secondary | ICD-10-CM | POA: Diagnosis not present

## 2024-01-03 DIAGNOSIS — N186 End stage renal disease: Secondary | ICD-10-CM | POA: Diagnosis not present

## 2024-01-03 DIAGNOSIS — Z992 Dependence on renal dialysis: Secondary | ICD-10-CM | POA: Diagnosis not present

## 2024-01-05 DIAGNOSIS — J449 Chronic obstructive pulmonary disease, unspecified: Secondary | ICD-10-CM | POA: Diagnosis not present

## 2024-01-05 DIAGNOSIS — J9621 Acute and chronic respiratory failure with hypoxia: Secondary | ICD-10-CM | POA: Diagnosis not present

## 2024-01-05 DIAGNOSIS — D631 Anemia in chronic kidney disease: Secondary | ICD-10-CM | POA: Diagnosis not present

## 2024-01-05 DIAGNOSIS — I120 Hypertensive chronic kidney disease with stage 5 chronic kidney disease or end stage renal disease: Secondary | ICD-10-CM | POA: Diagnosis not present

## 2024-01-05 DIAGNOSIS — R1312 Dysphagia, oropharyngeal phase: Secondary | ICD-10-CM | POA: Diagnosis not present

## 2024-01-05 DIAGNOSIS — N186 End stage renal disease: Secondary | ICD-10-CM | POA: Diagnosis not present

## 2024-01-06 DIAGNOSIS — N186 End stage renal disease: Secondary | ICD-10-CM | POA: Diagnosis not present

## 2024-01-06 DIAGNOSIS — Z992 Dependence on renal dialysis: Secondary | ICD-10-CM | POA: Diagnosis not present

## 2024-01-06 DIAGNOSIS — N2581 Secondary hyperparathyroidism of renal origin: Secondary | ICD-10-CM | POA: Diagnosis not present

## 2024-01-06 DIAGNOSIS — D631 Anemia in chronic kidney disease: Secondary | ICD-10-CM | POA: Diagnosis not present

## 2024-01-06 DIAGNOSIS — D509 Iron deficiency anemia, unspecified: Secondary | ICD-10-CM | POA: Diagnosis not present

## 2024-01-07 ENCOUNTER — Telehealth: Payer: Self-pay

## 2024-01-07 ENCOUNTER — Encounter: Payer: Self-pay | Admitting: Primary Care

## 2024-01-07 ENCOUNTER — Ambulatory Visit: Payer: Medicare Other | Admitting: Primary Care

## 2024-01-07 ENCOUNTER — Ambulatory Visit: Payer: Medicare Other | Admitting: Pulmonary Disease

## 2024-01-07 VITALS — BP 141/61 | HR 82 | Temp 97.3°F | Ht 70.0 in | Wt 157.0 lb

## 2024-01-07 DIAGNOSIS — J9611 Chronic respiratory failure with hypoxia: Secondary | ICD-10-CM

## 2024-01-07 DIAGNOSIS — Z87891 Personal history of nicotine dependence: Secondary | ICD-10-CM | POA: Diagnosis not present

## 2024-01-07 DIAGNOSIS — J410 Simple chronic bronchitis: Secondary | ICD-10-CM | POA: Diagnosis not present

## 2024-01-07 DIAGNOSIS — G4733 Obstructive sleep apnea (adult) (pediatric): Secondary | ICD-10-CM | POA: Diagnosis not present

## 2024-01-07 LAB — PULMONARY FUNCTION TEST
DL/VA % pred: 45 %
DL/VA: 1.77 ml/min/mmHg/L
DLCO cor % pred: 32 %
DLCO cor: 7.86 ml/min/mmHg
DLCO unc % pred: 32 %
DLCO unc: 7.86 ml/min/mmHg
FEF 25-75 Post: 0.86 L/s
FEF 25-75 Pre: 0.85 L/s
FEF2575-%Change-Post: 1 %
FEF2575-%Pred-Post: 47 %
FEF2575-%Pred-Pre: 46 %
FEV1-%Change-Post: 1 %
FEV1-%Pred-Post: 64 %
FEV1-%Pred-Pre: 63 %
FEV1-Post: 1.77 L
FEV1-Pre: 1.75 L
FEV1FVC-%Change-Post: -2 %
FEV1FVC-%Pred-Pre: 85 %
FEV6-%Change-Post: 2 %
FEV6-%Pred-Post: 80 %
FEV6-%Pred-Pre: 78 %
FEV6-Post: 2.93 L
FEV6-Pre: 2.85 L
FEV6FVC-%Change-Post: -1 %
FEV6FVC-%Pred-Post: 104 %
FEV6FVC-%Pred-Pre: 106 %
FVC-%Change-Post: 4 %
FVC-%Pred-Post: 77 %
FVC-%Pred-Pre: 73 %
FVC-Post: 3.01 L
FVC-Pre: 2.88 L
Post FEV1/FVC ratio: 59 %
Post FEV6/FVC ratio: 97 %
Pre FEV1/FVC ratio: 61 %
Pre FEV6/FVC Ratio: 99 %
RV % pred: 189 %
RV: 5.16 L
TLC % pred: 110 %
TLC: 7.83 L

## 2024-01-07 NOTE — Progress Notes (Addendum)
 @Patient  ID: Jeremy [name], male    DOB: Feb 04, 1941, 83 y.o.   MRN: [id number]  Chief Complaint  Patient presents with   Follow-up    Referring provider: Everrett Coombe, DO  HPI: 83 year old male, former smoker. PMH significant for HTN, chronic bronchitis, chronic respiratory failure, OSA intolerant to CPAP, ESRD. Patient of Dr. Wynona Neat, last seen on 12/21/20. Smoking cessation encouraged. Referred for oral appliance    01/07/2024 - interim hx  Discussed the use of AI scribe software for clinical note transcription with the patient, who gave verbal consent to proceed.  History of Present Illness   Patient presents today for follow-up with PFTs. Followed by our office for sleep apnea, respiratory failure, and COPD, presents for follow-up regarding CPAP intolerance and oxygen therapy.  EMT was called to patients house on 12/16/23 due to tachycardia and low oxygen readings. Lungs were clear, however O2 dropped into the mid 70s on 3L. Possible reading was inaccurant due to patient having cold fingers. Patient refused ED. He gets dialysis three times a week. He was not experiencing any shortness of breath, cough, wheezing or additional symptoms   He has a history of sleep apnea and is intolerant to CPAP therapy. He previously tried an oral appliance without improvement. A recent sleep study was conducted, and he received a new mask, but has not yet started using it due to recent nasal surgery for basal cell carcinoma. He experiences significant dry mouth, which he manages with a humidifier and special mouthwash.  He has chronic respiratory failure and is on continuous oxygen therapy. He questions the need for 24-hour oxygen, noting that his oxygen levels drop into the seventies and eighties without it. He has experienced significant drops in oxygen levels during short periods without oxygen, confirmed by EMS observations. He uses a [employer], but it is insufficient for his needs.  He  has a history of COPD, previously known as chronic bronchitis. He quit smoking six months ago after a 60-year history of smoking. Pulmonary function tests indicate moderate COPD with a lung function of 64% predicted and a decreased diffusion capacity of 32%. He experiences chronic mucus production, which varies in color depending on his diet.  He has a history of nasal bleeding, which he attributes to his sleeping position and possibly a need for further ENT evaluation. He has not previously seen an ENT specialist for this issue.  He has not yet participated in pulmonary rehabilitation, although it was previously referred. He is considering participating in home physical therapy through [facility].   CXR on 12/19/23 showed nonspecific interstitial prominence consistent with atypical infection, edema or interstitial lung disease. Hyperinflated lungs consistent with COPD. No focal consolidation.      Pulmonary function testing 01/07/2024 >> FVC 3.01 (77%), FEV1 1.77 (64%), ratio 59, DLCO on correlated 7.86 (32%) Impression: Moderate obstructive defect with severe diffusion defect which improves when correlated with lung volumes  Allergies  Allergen Reactions   Sulfa Antibiotics     Rash, light headed, shaking   Penicillins Other (See Comments)    hives severe as a child    Tape     Pulls skin off    Immunization History  Administered Date(s) Administered   Fluad Quad(high Dose 65+) 07/06/2019, 08/30/2022   Hepb-cpg 06/26/2023, 08/05/2023, 08/28/2023   Influenza Split 10/01/2011, 09/12/2012, 08/11/2013, 08/07/2014, 11/11/2017   Influenza Whole 09/11/2009, 09/02/2010   Influenza, High Dose Seasonal PF 08/09/2013, 08/22/2017, 08/18/2018   Influenza,inj,Quad PF,6+ Mos 07/30/2013, 09/12/2015, 10/11/2016  Influenza,trivalent, recombinat, inj, PF 09/02/2023   Influenza-Unspecified 08/24/2017, 09/08/2020   PFIZER(Purple Top)SARS-COV-2 Vaccination 11/19/2019, 12/10/2019, 07/18/2020, 10/18/2022    Pfizer(Comirnaty)Fall Seasonal Vaccine 12 years and older 10/18/2022   Pneumococcal Conjugate-13 01/26/2014   Pneumococcal Polysaccharide-23 10/10/2008   Td 01/15/2010   Tdap 07/03/2021   Zoster, Live 02/09/2014    Past Medical History:  Diagnosis Date   Anemia    Cancer (HCC)    patient and family have not been told that he has cancer.   Chronic kidney disease    dialysis T/Th/Sa   COPD (chronic obstructive pulmonary disease) (HCC)    Enlarged prostate    History of blood transfusion    Hyperlipidemia    Hypertension    Self-catheterizes urinary bladder    pt. does this morning and evening--been doing this since 11/2016   Sleep apnea    not using a cpap machine    Tobacco History: Social History   Tobacco Use  Smoking Status Former   Current packs/day: 0.00   Average packs/day: 0.2 packs/day for 61.5 years (12.3 ttl pk-yrs)   Types: Cigarettes   Start date: 12/29/1961   Quit date: 07/2023   Years since quitting: 0.4   Passive exposure: Never  Smokeless Tobacco Never  Tobacco Comments   Smokes 1/2 pack a day    Pt states he quit smoking 2 months ago. AB, CMA 10-07-23   Counseling given: Not Answered Tobacco comments: Smokes 1/2 pack a day  Pt states he quit smoking 2 months ago. AB, CMA 10-07-23   Outpatient Medications Prior to Visit  Medication Sig Dispense Refill   AMBULATORY NON FORMULARY MEDICATION Please provide portable O2 concentrator for patient. Flow 2L/min, 24/7 use.  J96.10-Chronic respiratory failure. 1 Device 0   AMBULATORY NON FORMULARY MEDICATION Please provide portable oxygen concentrator.  Flow 3L/min.  Dx: Chronic respiratory failure J96.11, COPD J42 1 Device 0   arformoterol (BROVANA) 15 MCG/2ML NEBU Take 2 mLs (15 mcg total) by nebulization 2 (two) times daily. 120 mL 3   budesonide (PULMICORT) 0.5 MG/2ML nebulizer solution Take 2 mLs (0.5 mg total) by nebulization 2 (two) times daily. 120 mL 3   Cholecalciferol (VITAMIN D) 50 MCG (2000 UT)  CAPS Take 2,000 Units by mouth daily.     docusate (COLACE) 50 MG/5ML liquid Take 10 mLs (100 mg total) by mouth 2 (two) times daily. 100 mL 0   fluticasone (FLONASE) 50 MCG/ACT nasal spray Place 1 spray into both nostrils daily as needed for allergies or rhinitis.     guaiFENesin (ROBITUSSIN) 100 MG/5ML liquid Take 20 mLs by mouth 2 (two) times daily. (Patient taking differently: Take 20 mLs by mouth 2 (two) times daily. Dm Max) 120 mL 0   ipratropium (ATROVENT) 0.03 % nasal spray Place 2 sprays into both nostrils 2 (two) times daily. 30 mL 2   metoprolol tartrate (LOPRESSOR) 25 MG tablet Take 1 tablet (25 mg total) by mouth 2 (two) times daily. 180 tablet 3   Multiple Vitamin (MULTIVITAMIN) LIQD Take 5 mLs by mouth daily.     OXYGEN Inhale 3 L into the lungs continuous.     polyethylene glycol (MIRALAX / GLYCOLAX) 17 g packet Take 17 g by mouth daily. 14 each 0   revefenacin (YUPELRI) 175 MCG/3ML nebulizer solution Take 3 mLs (175 mcg total) by nebulization daily. 90 mL 3   sevelamer carbonate (RENVELA) 0.8 g PACK packet Take 0.8 g by mouth daily as needed (snacks).     sevelamer carbonate (RENVELA)  2.4 g PACK Take 2.4 g by mouth 3 (three) times daily with meals.     No facility-administered medications prior to visit.      Review of Systems  Review of Systems  Constitutional: Negative.   HENT: Negative.    Respiratory: Negative.     Physical Exam  BP (!) 141/61 (BP Location: Right Arm, Patient Position: Sitting, Cuff Size: Normal)   Pulse 82   Temp (!) 97.3 F (36.3 C) (Temporal)   Ht 5\' 10"  (1.778 m)   Wt 157 lb (71.2 kg)   SpO2 98%   BMI 22.53 kg/m  Physical Exam Constitutional:      General: He is not in acute distress.    Appearance: Normal appearance. He is not ill-appearing.  HENT:     Head: Normocephalic and atraumatic.  Cardiovascular:     Rate and Rhythm: Normal rate and regular rhythm.  Pulmonary:     Effort: Pulmonary effort is normal. No respiratory  distress.     Breath sounds: No wheezing, rhonchi or rales.     Comments: O2 98% RA Skin:    General: Skin is warm and dry.  Neurological:     General: No focal deficit present.     Mental Status: He is alert and oriented to person, place, and time. Mental status is at baseline.  Psychiatric:        Mood and Affect: Mood normal.        Behavior: Behavior normal.        Thought Content: Thought content normal.        Judgment: Judgment normal.      Lab Results:  CBC    Component Value Date/Time   WBC 12.1 (H) 08/19/2023 2131   RBC 4.09 (L) 08/19/2023 2131   HGB 7.5 (L) 10/31/2023 0903   HCT 22.0 (L) 10/31/2023 0903   PLT 313 08/19/2023 2131   MCV 98.5 08/19/2023 2131   MCH 32.5 08/19/2023 2131   MCHC 33.0 08/19/2023 2131   RDW 14.1 08/19/2023 2131   LYMPHSABS 1.0 08/19/2023 2131   MONOABS 1.1 (H) 08/19/2023 2131   EOSABS 0.1 08/19/2023 2131   BASOSABS 0.0 08/19/2023 2131    BMET    Component Value Date/Time   NA 141 10/31/2023 0903   K 3.6 10/31/2023 0903   CL 97 (L) 10/31/2023 0903   CO2 25 08/19/2023 2131   GLUCOSE 90 10/31/2023 0903   BUN 30 (H) 10/31/2023 0903   CREATININE 4.60 (H) 10/31/2023 0903   CREATININE 3.23 (H) 04/23/2022 0000   CALCIUM 9.0 08/19/2023 2131   GFRNONAA 11 (L) 08/19/2023 2131   GFRNONAA 38 (L) 12/12/2020 0000   GFRAA 44 (L) 12/12/2020 0000    BNP    Component Value Date/Time   BNP 319.7 (H) 08/19/2023 2131    ProBNP    Component Value Date/Time   PROBNP 73.0 10/13/2009 1033    Imaging: DG Chest 2 View Result Date: 01/01/2024 CLINICAL DATA:  Dyspnea. COPD EXAM: CHEST - 2 VIEW COMPARISON:  09/22/2023. FINDINGS: The heart size and mediastinal contours are within normal limits. There is diffuse pulmonary interstitial prominence which could be seen with atypical infection, edema or interstitial lung disease. Lungs are hyperinflated consistent with COPD. There is no focal consolidation. No pneumothorax or pleural effusion. Aorta is  calcified. The visualized osseous structures are unremarkable. IMPRESSION: Nonspecific interstitial prominence consistent with atypical infection, edema or interstitial lung disease. Hyperinflated lungs consistent with COPD. No focal consolidation. Electronically Signed  By: Layla Maw M.D.   On: 01/01/2024 21:16   DG SWALLOW FUNC OP MEDICARE SPEECH PATH Result Date: 12/08/2023 Table formatting from the original result was not included. Modified Barium Swallow Study Patient Details Name: TAJAY MUZZY MRN: [id number] Date of Birth: 03-Dec-1940 Today's Date: 12/08/2023 HPI/PMH: HPI: Jashun Puertas is an 83 y.o. male with PMH: COPD, tobacco use, (h/o 1ppd) CKD, anemia, sleep apnea, HTN, HLD, dysphagia, ESRD on HD. Inpatient MBS completed on 10/2 reported moderate oropharyngeal dysphagia with aspiration or thin liquids, deep penetration with nectar thick and honey thick liquids. He was discharged to SNF on 10/8 and recently discharged home. He had an OP MBS on 09/15/23 and subsequently has been getting home health SLP intervention for dysphagia. He is here for repeat OP as recommended by his [facility] SLP to determine improvement in swallow function. Clinical Impression: Clinical Impression: Mr. Laura presents with improvement in swallow function as compared to [facility] on 09/15/23. His wife did report that they have a new nebulizer machine and it has done a much better job of clearing secretions than the previous one. During today's MBS, patient exhibited improved pharyngeal sensation as evidenced by spontaneous throat clearing when barium visualized in pharynx and in or near laryngeal vestibule and overall improved protective cough response. Patient exhibited silent aspiration (PAS 8) of nectar thick liquids during initial few sips and one instance of questionable silent aspiration with thin liquids (shoulder obscured view during at times). Subsequent sips of nectar thick liquids did not result in aspiration. Swallow  initiation continues to be delayed to level of pyriform sinus with liquids and to level of vallecular sinus with puree solids. Flash penetration (PAS 2) occured one time with honey thick liquids. With small spoon sips of thin liquids, patient exhibited penetration but no aspiration. Patient continues with reduced epiglottic inversion secondary to epiglottis coming into contact with posterior pharyngeal wall during swallow. SLP recommending continue mechanical soft solids, advance liquids to nectar thick and allow patient to have thin liquids via spoon sip only. SLP discussed that patient will continue to be at risk of aspiration but following precautions will help to reduce this risk. SLP also recommending patient continue with [facility] SLP services. Factors that may increase risk of adverse event in presence of aspiration Rubye Oaks & Clearance Coots 2021): Factors that may increase risk of adverse event in presence of aspiration Rubye Oaks & Clearance Coots 2021): Poor general health and/or compromised immunity; Limited mobility; Frail or deconditioned Recommendations/Plan: Swallowing Evaluation Recommendations Swallowing Evaluation Recommendations Recommendations: PO diet PO Diet Recommendation: Dysphagia 3 (Mechanical soft); Mildly thick liquids (Level 2, nectar thick) Liquid Administration via: Cup; No straw Swallowing strategies  : Small bites/sips; Multiple dry swallows after each bite/sip; Clear throat intermittently Postural changes: Position pt fully upright for meals Treatment Plan Treatment Plan Treatment recommendations: Defer treatment plan to SLP at other venue (see follow-up recommendations) Follow-up recommendations: Home health SLP Recommendations Recommendations for follow up therapy are one component of a multi-disciplinary discharge planning process, led by the attending physician.  Recommendations may be updated based on patient status, additional functional criteria and insurance authorization. Assessment: Orofacial  Exam: Orofacial Exam Oral Cavity: Oral Hygiene: WFL Oral Cavity - Dentition: Adequate natural dentition Orofacial Anatomy: WFL Oral Motor/Sensory Function: WFL Anatomy: Anatomy: Other (Comment) (see impression statement) Boluses Administered: Boluses Administered Boluses Administered: Thin liquids (Level 0); Mildly thick liquids (Level 2, nectar thick); Moderately thick liquids (Level 3, honey thick); Puree  Oral Impairment Domain: Oral Impairment Domain Lip Closure: No labial  escape Tongue control during bolus hold: Not tested Bolus preparation/mastication: Timely and efficient chewing and mashing Bolus transport/lingual motion: Slow tongue motion Oral residue: Complete oral clearance Location of oral residue : N/A Initiation of pharyngeal swallow : Pyriform sinuses  Pharyngeal Impairment Domain: Pharyngeal Impairment Domain Soft palate elevation: No bolus between soft palate (SP)/pharyngeal wall (PW) Laryngeal elevation: Partial superior movement of thyroid cartilage/partial approximation of arytenoids to epiglottic petiole Anterior hyoid excursion: Partial anterior movement Epiglottic movement: Partial inversion Laryngeal vestibule closure: Incomplete, narrow column air/contrast in laryngeal vestibule Pharyngeal stripping wave : Present - diminished Pharyngeal contraction (A/P view only): N/A Pharyngoesophageal segment opening: Complete distension and complete duration, no obstruction of flow Tongue base retraction: Trace column of contrast or air between tongue base and PPW Pharyngeal residue: Collection of residue within or on pharyngeal structures Location of pharyngeal residue: Valleculae; Pharyngeal wall; Aryepiglottic folds; Pyriform sinuses; Diffuse (>3 areas)  Esophageal Impairment Domain: Esophageal Impairment Domain Esophageal clearance upright position: Complete clearance, esophageal coating Pill: No data recorded Penetration/Aspiration Scale Score: Penetration/Aspiration Scale Score 1.  Material does  not enter airway: Puree 2.  Material enters airway, remains ABOVE vocal cords then ejected out: Moderately thick liquids (Level 3, honey thick) 3.  Material enters airway, remains ABOVE vocal cords and not ejected out: Thin liquids (Level 0); Mildly thick liquids (Level 2, nectar thick) 5.  Material enters airway, CONTACTS cords and not ejected out: Thin liquids (Level 0); Mildly thick liquids (Level 2, nectar thick) 8.  Material enters airway, passes BELOW cords without attempt by patient to eject out (silent aspiration) : Mildly thick liquids (Level 2, nectar thick); Thin liquids (Level 0) Compensatory Strategies: Compensatory Strategies Compensatory strategies: Yes Effortful swallow: Ineffective Ineffective Effortful Swallow: Thin liquid (Level 0); Mildly thick liquid (Level 2, nectar thick) Multiple swallows: Effective Effective Multiple Swallows: Thin liquid (Level 0); Mildly thick liquid (Level 2, nectar thick); Puree; Moderately thick liquid (Level 3, honey thick) Posterior head tilt: Ineffective Ineffective Posterior head tilt: Mildly thick liquid (Level 2, nectar thick)   General Information: Caregiver present: Yes  Diet Prior to this Study: Dysphagia 3 (mechanical soft); Moderately thick liquids (Level 3, honey thick)   No data recorded  Respiratory Status: WFL   Supplemental O2: Nasal cannula   History of Recent Intubation: No  Behavior/Cognition: Alert; Cooperative; Pleasant mood Self-Feeding Abilities: Able to self-feed; Needs set-up for self-feeding Baseline vocal quality/speech: Normal No data recorded No data recorded Exam Limitations: Other (comment) (right shoulder frequently obscuring view of trachea) Goal Planning: No data recorded No data recorded No data recorded No data recorded No data recorded Pain: Pain Assessment Pain Assessment: No/denies pain End of Session: Start Time:SLP Start Time (ACUTE ONLY): 1320 Stop Time: SLP Stop Time (ACUTE ONLY): 1350 Time Calculation:SLP Time Calculation  (min) (ACUTE ONLY): 30 min Charges: SLP Evaluations $ SLP Speech Visit: 1 Visit SLP Evaluations $Outpatient MBS Swallow: 1 Procedure SLP visit diagnosis: SLP Visit Diagnosis: Dysphagia, oropharyngeal phase (R13.12) Past Medical History: Past Medical History: Diagnosis Date  Anemia   Cancer (HCC)   patient and family have not been told that he has cancer.  Chronic kidney disease   dialysis T/Th/Sa  COPD (chronic obstructive pulmonary disease) (HCC)   Enlarged prostate   History of blood transfusion   Hyperlipidemia   Hypertension   Self-catheterizes urinary bladder   pt. does this morning and evening--been doing this since 11/2016  Sleep apnea   not using a cpap machine Past Surgical History: Past Surgical History: Procedure Laterality  Date  A/V FISTULAGRAM Left 03/12/2023  Procedure: A/V Fistulagram;  Surgeon: Victorino Sparrow, MD;  Location: [facility] INVASIVE CV LAB;  Service: Cardiovascular;  Laterality: Left;  A/V FISTULAGRAM Left 10/31/2023  Procedure: A/V Fistulagram;  Surgeon: Ethelene Hal, MD;  Location: [facility] INVASIVE CV LAB;  Service: Cardiovascular;  Laterality: Left;  AV FISTULA PLACEMENT Left 10/16/2022  Procedure: LEFT BRACHIOCEPHALIC ARTERIOVENOUS (AV) FISTULA CREATION;  Surgeon: Cephus Shelling, MD;  Location: [facility] OR;  Service: Vascular;  Laterality: Left;  BACK SURGERY    BLADDER TUMOR EXCISION  2015  benign  BLEPHAROPLASTY  12/2018  LIGATION OF COMPETING BRANCHES OF ARTERIOVENOUS FISTULA Left 03/17/2023  Procedure: LEFT ARM FISTULA BRANCH LIGATION;  Surgeon: Victorino Sparrow, MD;  Location: [facility] OR;  Service: Vascular;  Laterality: Left;  LUMBAR LAMINECTOMY/DECOMPRESSION MICRODISCECTOMY Bilateral 08/25/2017  Procedure: Bilateral Lumbar Three- Four, Lumbar Four- Five, Lumbar Five- Sacral One Laminectomy;  Surgeon: Barnett Abu, MD;  Location: [facility] OR;  Service: Neurosurgery;  Laterality: Bilateral;  Bilateral L3-4 L4-5 L5-S1 Laminectomy  SKIN CANCER EXCISION    Surg x2...  TONSILLECTOMY AND ADENOIDECTOMY    Surg  as a child...  TRANSURETHRAL RESECTION OF BLADDER TUMOR N/A 01/03/2022  Procedure: TRANSURETHRAL RESECTION OF BLADDER TUMOR (TURBT)/ CYSTOSCOPY/ EXAM UNDER ANESTHESIA, BILATERAL RETROGRADE/ BLADDER BIOPSIES;  Surgeon: Heloise Purpura, MD;  Location: [facility] ORS;  Service: Urology;  Laterality: N/A;  GENERAL ANESTHESIA WITH PARALYSIS Angela Nevin, MA, CCC-SLP Speech Therapy CLINICAL DATA:  Dysphagia. Cough/GE reflux disease/other secondary diagnosis EXAM: MODIFIED BARIUM SWALLOW TECHNIQUE: Radiologist, not in attendance for the exam. Different consistencies of barium were administered orally to the patient by the Speech Pathologist. Imaging of the pharynx was performed in the lateral projection. The radiologist was present in the fluoroscopy room for this study, providing personal supervision. FLUOROSCOPY: Radiation Exposure Index (as provided by the fluoroscopic device): 16.7 mGy Kerma COMPARISON:  None Available. FINDINGS: Vestibular  Penetration:  None seen. Aspiration: Mild aspiration with nectar on initial swallows. Cough elicited. Subsequent protection. Other:  None. IMPRESSION: see speech pathology report Please refer to the Speech Pathologists report for complete details and recommendations. Electronically Signed   By: Genevive Bi M.D.   On: 12/08/2023 14:16    Assessment & Plan:   1. Chronic respiratory failure with hypoxia (HCC) (Primary) - AMB referral to pulmonary rehabilitation - Ambulatory Referral for DME - CT Chest High Resolution; Future  2. OSA (obstructive sleep apnea) - Ambulatory Referral for DME  3. Simple chronic bronchitis (HCC) - CT Chest High Resolution; Future     Sleep Apnea:  Intolerant to CPAP, tried oral appliance without noticeable improvement. Recent weight loss since last sleep study in 2018. Patient open to retrying CPAP. Recent sleep study showed optimal pressure at 11, but patient has not started using it yet due to recent nasal surgery and issues with dry  mouth. -Encourage patient to start using CPAP with new mask.  Chronic Obstructive Pulmonary Disease (COPD):  - Patient is on oxygen for respiratory failure. Recent cessation of smoking. Pulmonary function test shows moderate COPD (64% predicted). Diffusing capacity of lungs is decreased (32%), indicating need for oxygen. CXR showed nonspecific interstitial prominence consistent with atypical infection, edema or interstitial lung disease. Hyperinflated lungs consistent with COPD. No focal consolidation.  -Continue Brovana, Pulmicort, and Yupelri nebulizers  -Ordering HRCT due nonspecific interstitial changes on CXR -Continue oxygen therapy at 3L. -[employer] does not carry portable continuous tanks, advised patient they can purchase on their own  -Referred to pulmonary rehab  Saturation Qualifications: (This note is used to comply with regulatory documentation for POC)   Patient Saturations on Room Air at Rest= 96%   Patient Saturations on 2L POC while Ambulating= 96%    Dry Mouth:  Patient experiences significant dry mouth, which is impacting quality of life and potentially limiting CPAP use. -Consider evaluation for Sjogren's syndrome or other causes of dry mouth. -Encourage adequate hydration, although this is limited by patient's dialysis restrictions.  General Health Maintenance: -Continue monitoring oxygen levels, particularly during exertion. -Explore options for a continuous oxygen supply that could be used for patient's mobility and activities. -Consider pulmonary rehabilitation for improved lung function and overall health.      Glenford Bayley, NP 01/07/2024

## 2024-01-07 NOTE — Patient Instructions (Signed)
 Full PFT performed today.

## 2024-01-07 NOTE — Telephone Encounter (Signed)
 Copied from CRM [id number]. Topic: Clinical - Medical Advice >> Jan 05, 2024 11:08 AM Jeremy [name] wrote: Reason for CRM: [name] with [facility] calling in to state that patient is being discharged from home health today, as his goals have been met.

## 2024-01-07 NOTE — Progress Notes (Signed)
 Full PFT performed today.

## 2024-01-07 NOTE — Patient Instructions (Addendum)
-  SLEEP APNEA: Sleep apnea is a condition where your breathing repeatedly stops and starts during sleep. We encourage you to start using your CPAP machine with the new mask, even though you've had some issues with dry mouth.  -CHRONIC OBSTRUCTIVE PULMONARY DISEASE (COPD): COPD is a chronic inflammatory lung disease that causes obstructed airflow from the lungs. Continue Brovana/Pulmicort twice daily and Yupelri once daily. Use oxygen with your nebulizer's.   -DRY MOUTH: Dry mouth is a condition where your mouth doesn't produce enough saliva. Reach out to primary care to see if there is any treatment such as saliva substitute or medications to help with dry mouth. We also encourage you to stay hydrated and use sugar free hard candies to help with symptoms.   INSTRUCTIONS:  Please start using your CPAP machine with the new mask as soon as possible. Continue your oxygen therapy at 3L and consider a continuous oxygen supply for your mobility and activities. Stay hydrated as much as possible, within your dialysis restrictions. We will continue monitoring your oxygen levels, particularly during exertion. We also encourage you to consider pulmonary rehabilitation for improved lung function and overall health.  Follow-up 8-12 weeks with Dr. Wynona Neat  (If no openings can schedule with [facility])

## 2024-01-08 DIAGNOSIS — N186 End stage renal disease: Secondary | ICD-10-CM | POA: Diagnosis not present

## 2024-01-08 DIAGNOSIS — D509 Iron deficiency anemia, unspecified: Secondary | ICD-10-CM | POA: Diagnosis not present

## 2024-01-08 DIAGNOSIS — Z992 Dependence on renal dialysis: Secondary | ICD-10-CM | POA: Diagnosis not present

## 2024-01-08 DIAGNOSIS — N2581 Secondary hyperparathyroidism of renal origin: Secondary | ICD-10-CM | POA: Diagnosis not present

## 2024-01-08 DIAGNOSIS — D631 Anemia in chronic kidney disease: Secondary | ICD-10-CM | POA: Diagnosis not present

## 2024-01-09 ENCOUNTER — Telehealth (HOSPITAL_COMMUNITY): Payer: Self-pay

## 2024-01-09 ENCOUNTER — Ambulatory Visit: Payer: Medicare Other | Admitting: Podiatry

## 2024-01-09 NOTE — Telephone Encounter (Signed)
 Received referral from Dr. 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 Clinical review of pt follow up appt on 01/07/24 with Ames Dura, NP under Dr. Aldean Ast Pulmonary office note. Pt appropriate for scheduling for Pulmonary rehab.   Called pt's spouse, Jeremy [name]. Jeremy [name] would like to change Jeremy [name]'s dialysis schedule and check on other appointments before scheduling for Pulmonary Rehab orientation. Jeremy [name] department number if she wishes to schedule.

## 2024-01-10 DIAGNOSIS — N2581 Secondary hyperparathyroidism of renal origin: Secondary | ICD-10-CM | POA: Diagnosis not present

## 2024-01-10 DIAGNOSIS — Z992 Dependence on renal dialysis: Secondary | ICD-10-CM | POA: Diagnosis not present

## 2024-01-10 DIAGNOSIS — D509 Iron deficiency anemia, unspecified: Secondary | ICD-10-CM | POA: Diagnosis not present

## 2024-01-10 DIAGNOSIS — N186 End stage renal disease: Secondary | ICD-10-CM | POA: Diagnosis not present

## 2024-01-10 DIAGNOSIS — D631 Anemia in chronic kidney disease: Secondary | ICD-10-CM | POA: Diagnosis not present

## 2024-01-10 DIAGNOSIS — I129 Hypertensive chronic kidney disease with stage 1 through stage 4 chronic kidney disease, or unspecified chronic kidney disease: Secondary | ICD-10-CM | POA: Diagnosis not present

## 2024-01-13 ENCOUNTER — Ambulatory Visit: Payer: Self-pay | Admitting: Primary Care

## 2024-01-13 ENCOUNTER — Telehealth: Payer: Self-pay

## 2024-01-13 ENCOUNTER — Telehealth: Payer: Self-pay | Admitting: Primary Care

## 2024-01-13 DIAGNOSIS — D509 Iron deficiency anemia, unspecified: Secondary | ICD-10-CM | POA: Diagnosis not present

## 2024-01-13 DIAGNOSIS — N186 End stage renal disease: Secondary | ICD-10-CM | POA: Diagnosis not present

## 2024-01-13 DIAGNOSIS — N2581 Secondary hyperparathyroidism of renal origin: Secondary | ICD-10-CM | POA: Diagnosis not present

## 2024-01-13 DIAGNOSIS — Z992 Dependence on renal dialysis: Secondary | ICD-10-CM | POA: Diagnosis not present

## 2024-01-13 DIAGNOSIS — D631 Anemia in chronic kidney disease: Secondary | ICD-10-CM | POA: Diagnosis not present

## 2024-01-13 NOTE — Telephone Encounter (Signed)
 Spoke with patient's wife regarding CXR results -she was concerned over "possible Atypical infection" -states patient does have occasional wheezing  but does not feel or seem sick.  Was seen by pulmonology nurse doing a lung evaluation for Pulmonary rehab  last week and will see pulmonologist again tomorrow.  She had suggested that patient get a CT scan but one was not ordered that she is aware of.  She is requesting a call back  She would like to know if she should be concerned over the results. ?

## 2024-01-13 NOTE — Telephone Encounter (Signed)
 Copied from CRM [id number]. Topic: Clinical - Lab/Test Results >> Jan 13, 2024  1:32 PM Higinio Roger wrote: Reason for CRM: Patient's wife Alcalde, Elease Hashimoto) would like a callback to discuss xray of chest. Callback #: [phone]

## 2024-01-13 NOTE — Telephone Encounter (Signed)
 disregard

## 2024-01-13 NOTE — Telephone Encounter (Signed)
 The patient's wife reported that he had an x-ray 12/23/23 at pcp's office that showed a lung infection but she was not notified of the results because she does not check mychart often.  The results were available 01/02/24 and she just received a follow up call regarding how he was feeling.  Since he was hospitalized in October and in a skilled care facility for a total of 1 month she is very anxious and concerned that the infection has gone untreated.  For the past 2 days, she has noticed that he has been wheezing more at night.  He has a productive cough with clear sputum.  She denied a fever.  He wears continuous oxygen and his oxygen saturation has been 94-96%.  He has been doing nebulizer treatments daily.  He has been more tired than usual and has been sleeping a lot.  The patient is currently at dialysis but she is requesting an appointment as soon as possible.  Transferred the caller to [facility] for scheduling options.  Reason for Disposition  [1] Known COPD or other severe lung disease (i.e., bronchiectasis, cystic fibrosis, lung surgery) AND [2] worsening symptoms (i.e., increased sputum purulence or amount, increased breathing difficulty  Answer Assessment - Initial Assessment Questions 1. ONSET: "When did the cough begin?"      12/23/23 2. SEVERITY: "How bad is the cough today?"      Intermittent  3. SPUTUM: "Describe the color of your sputum" (none, dry cough; clear, white, yellow, green)     Clear  4. HEMOPTYSIS: "Are you coughing up any blood?" If so ask: "How much?" (flecks, streaks, tablespoons, etc.)     None  5. DIFFICULTY BREATHING: "Are you having difficulty breathing?" If Yes, ask: "How bad is it?" (e.g., mild, moderate, severe)    - MILD: No SOB at rest, mild SOB with walking, speaks normally in sentences, can lie down, no retractions, pulse < 100.    - MODERATE: SOB at rest, SOB with minimal exertion and prefers to sit, cannot lie down flat, speaks in phrases, mild retractions,  audible wheezing, pulse 100-120.    - SEVERE: Very SOB at rest, speaks in single words, struggling to breathe, sitting hunched forward, retractions, pulse > 120      Wheezing off and on past couple of days  6. FEVER: "Do you have a fever?" If Yes, ask: "What is your temperature, how was it measured, and when did it start?"     None  8. LUNG HISTORY: "Do you have any history of lung disease?"  (e.g., pulmonary embolus, asthma, emphysema)     COPD 10. OTHER SYMPTOMS: "Do you have any other symptoms?" (e.g., runny nose, wheezing, chest pain)       Fatigue Oxygen saturation 94-96%  Protocols used: Cough - Acute Productive-A-AH

## 2024-01-13 NOTE — Telephone Encounter (Signed)
 Patient scheduled 01/14/24.

## 2024-01-14 ENCOUNTER — Telehealth: Payer: Self-pay | Admitting: *Deleted

## 2024-01-14 ENCOUNTER — Encounter: Attending: Pulmonary Disease | Admitting: *Deleted

## 2024-01-14 ENCOUNTER — Ambulatory Visit

## 2024-01-14 ENCOUNTER — Ambulatory Visit: Admitting: Nurse Practitioner

## 2024-01-14 ENCOUNTER — Encounter: Payer: Self-pay | Admitting: Nurse Practitioner

## 2024-01-14 VITALS — BP 150/56 | HR 99 | Temp 97.9°F | Ht 70.0 in | Wt 156.4 lb

## 2024-01-14 DIAGNOSIS — G4733 Obstructive sleep apnea (adult) (pediatric): Secondary | ICD-10-CM | POA: Diagnosis not present

## 2024-01-14 DIAGNOSIS — J449 Chronic obstructive pulmonary disease, unspecified: Secondary | ICD-10-CM | POA: Insufficient documentation

## 2024-01-14 DIAGNOSIS — R053 Chronic cough: Secondary | ICD-10-CM

## 2024-01-14 DIAGNOSIS — N186 End stage renal disease: Secondary | ICD-10-CM

## 2024-01-14 DIAGNOSIS — J9611 Chronic respiratory failure with hypoxia: Secondary | ICD-10-CM

## 2024-01-14 DIAGNOSIS — J439 Emphysema, unspecified: Secondary | ICD-10-CM | POA: Diagnosis not present

## 2024-01-14 DIAGNOSIS — Z992 Dependence on renal dialysis: Secondary | ICD-10-CM | POA: Diagnosis not present

## 2024-01-14 DIAGNOSIS — J411 Mucopurulent chronic bronchitis: Secondary | ICD-10-CM

## 2024-01-14 DIAGNOSIS — R918 Other nonspecific abnormal finding of lung field: Secondary | ICD-10-CM | POA: Diagnosis not present

## 2024-01-14 DIAGNOSIS — R059 Cough, unspecified: Secondary | ICD-10-CM | POA: Diagnosis not present

## 2024-01-14 MED ORDER — OHTUVAYRE 3 MG/2.5ML IN SUSP: 2.5000 mL | Freq: Two times a day (BID) | RESPIRATORY_TRACT | Status: DC

## 2024-01-14 NOTE — Patient Instructions (Addendum)
 Continue budesonide 2 mL neb Twice daily. Brush tongue and rinse mouth afterwards Continue arformoterol 2 mL neb Twice daily  Continue Yupelri 3 mL daily Continue Albuterol inhaler 2 puffs or 3 mL neb every 6 hours as needed for shortness of breath or wheezing. Notify if symptoms persist despite rescue inhaler/neb use.  Continue supplemental oxygen 3 lpm for goal >88-90% Restart CPAP nightly   Start ohtuvayre 2.5 mL neb Twice daily. Monitor for any mood changes and stop and notify if these occur   Chest x ray today I think your last x ray was probably showing some changes consistent with fluid overload as you were in between dialysis treatments but we will repeat today. The high resolution CT chest will also help evaluate this. I'll follow up with the patient care coordinators on scheduling this  Follow up in 6 weeks with Dr. Wynona Neat to review CT results and see how new medication is going. If symptoms do not improve or worsen, please contact office for sooner follow up or seek emergency care.

## 2024-01-14 NOTE — Progress Notes (Signed)
 Initial phone call completed. Diagnosis can be found in [facility] 2/26. EP Orientation scheduled for Monday 3/10 at 10:30.

## 2024-01-14 NOTE — Progress Notes (Signed)
 @Patient  ID: Jeremy [name], male    DOB: 1941/08/11, 83 y.o.   MRN: [id number]  Chief Complaint  Patient presents with   Acute Visit    Had abnormal cxr result.  No symptoms.    Referring provider: Everrett Coombe, DO  HPI: 83 year old male, former smoker followed for COPD, OSA intolerant to CPAP and chronic respiratory failure. He is a patient of Jeremy [name] and last seen in office 01/07/2024 by Jeremy [name]. Past medical history significant for HTN, ESRD, bladder cancer, macular degeneration, HLD.  TEST/EVENTS:  03/21/2012 NPSG: AHI 43.1, SpO@ low 78% 11/16/2023 CPAP titration >> optimal pressure 11 cmH2O 01/07/2024 PFT: FVC 73, FEV1 63, ratio 59, TLC 110, DLCOcor 32. No BD. Moderate obstruction without reversibility and severe diffusion defect 12/19/2023 CXR: diffuse pulmonary interstitial prominence, edema, atypical infection vs ILD. Hyperinflated lungs.  01/07/2024: Jeremy [name] with Jeremy [name]. Jeremy [name] called to pt's home on 12/16/2023 due to low O2 readings. Declined ED. Questions the need for 24 hr oxygen therapy; drops into the 70's and 80's without it. Uses a portable concentrator, which is insufficient for his needs. PFT with moderate obstruction. He does have chronic mucus production, which varies in color depending on his diet.  Had recent nasal surgery so has not restarted CPAP therapy yet; open to retrying.  HRCT chest ordered. Continue triple therapy neb regimen. Referred to pulmonary rehab at home.   01/14/2024: Today - follow up Patient presents today for follow up. His wife had called due to concerns regarding his last chest x ray that was ordered by his PCP 12/19/2023. He had, had a EMS visit due to low oxygen levels prior to this. He has been seen at our office since then and had HRCT chest ordered for further evaluation due to interstitial prominence to rule out ILD. They were concerned this could possible be an atypical infection, from what his PCP had told them, so she wanted him seen sooner and  wanted to discuss further.  He feels unchanged compared to his last visit. No acute symptoms. No increased cough, shortness of breath, wheezing. No changes in appetite, fevers, chills, hemoptysis. He doesn't feel like his breathing is bothersome. He does have a daily cough with sputum production which varies in color. Don't have to use his albuterol very often. He is on triple therapy nebs. Quit smoking late last year.  He is ESRD and on HD. Prior CXR was on a day in between HD treatments. These findings can also be seen with pulmonary edema.  He's not back on CPAP yet. He is wearing oxygen 24/7 now. Haven't noticed any low levels over the past few weeks.   Allergies  Allergen Reactions   Sulfa Antibiotics     Rash, light headed, shaking   Penicillins Other (See Comments)    hives severe as a child    Tape     Pulls skin off    Immunization History  Administered Date(s) Administered   Fluad Quad(high Dose 65+) 07/06/2019, 08/30/2022   Hepb-cpg 06/26/2023, 08/05/2023, 08/28/2023   Influenza Split 10/01/2011, 09/12/2012, 08/11/2013, 08/07/2014, 11/11/2017   Influenza Whole 09/11/2009, 09/02/2010   Influenza, High Dose Seasonal PF 08/09/2013, 08/22/2017, 08/18/2018   Influenza,inj,Quad PF,6+ Mos 07/30/2013, 09/12/2015, 10/11/2016   Influenza,trivalent, recombinat, inj, PF 09/02/2023   Influenza-Unspecified 08/24/2017, 09/08/2020   PFIZER Comirnaty(Gray Top)Covid-19 Tri-Sucrose Vaccine 09/10/2023   PFIZER(Purple Top)SARS-COV-2 Vaccination 11/19/2019, 12/10/2019, 07/18/2020, 10/18/2022   Pfizer(Comirnaty)Fall Seasonal Vaccine 12 years and older 10/18/2022   Pneumococcal Conjugate-13 01/26/2014  Pneumococcal Polysaccharide-23 10/10/2008   Td 01/15/2010   Tdap 07/03/2021   Zoster, Live 02/09/2014    Past Medical History:  Diagnosis Date   Anemia    Cancer (HCC)    patient and family have not been told that he has cancer.   Chronic kidney disease    dialysis T/Th/Sa   COPD  (chronic obstructive pulmonary disease) (HCC)    Enlarged prostate    History of blood transfusion    Hyperlipidemia    Hypertension    Self-catheterizes urinary bladder    pt. does this morning and evening--been doing this since 11/2016   Sleep apnea    not using a cpap machine    Tobacco History: Social History   Tobacco Use  Smoking Status Former   Current packs/day: 0.00   Average packs/day: 0.2 packs/day for 61.5 years (12.3 ttl pk-yrs)   Types: Cigarettes   Start date: 12/29/1961   Quit date: 07/2023   Years since quitting: 0.5   Passive exposure: Never  Smokeless Tobacco Never  Tobacco Comments   Smokes 1/2 pack a day    Pt states he quit smoking 2 months ago. AB, CMA 10-07-23   Counseling given: Not Answered Tobacco comments: Smokes 1/2 pack a day  Pt states he quit smoking 2 months ago. AB, CMA 10-07-23   Outpatient Medications Prior to Visit  Medication Sig Dispense Refill   AMBULATORY NON FORMULARY MEDICATION Please provide portable O2 concentrator for patient. Flow 2L/min, 24/7 use.  J96.10-Chronic respiratory failure. 1 Device 0   AMBULATORY NON FORMULARY MEDICATION Please provide portable oxygen concentrator.  Flow 3L/min.  Dx: Chronic respiratory failure J96.11, COPD J42 1 Device 0   arformoterol (BROVANA) 15 MCG/2ML NEBU Take 2 mLs (15 mcg total) by nebulization 2 (two) times daily. 120 mL 3   budesonide (PULMICORT) 0.5 MG/2ML nebulizer solution Take 2 mLs (0.5 mg total) by nebulization 2 (two) times daily. 120 mL 3   Cholecalciferol (VITAMIN D) 50 MCG (2000 UT) CAPS Take 2,000 Units by mouth daily.     fluticasone (FLONASE) 50 MCG/ACT nasal spray Place 1 spray into both nostrils daily as needed for allergies or rhinitis.     guaiFENesin (ROBITUSSIN) 100 MG/5ML liquid Take 20 mLs by mouth 2 (two) times daily. (Patient taking differently: Take 20 mLs by mouth 2 (two) times daily. Dm Max) 120 mL 0   ipratropium (ATROVENT) 0.03 % nasal spray Place 2 sprays into  both nostrils 2 (two) times daily. 30 mL 2   metoprolol tartrate (LOPRESSOR) 25 MG tablet Take 1 tablet (25 mg total) by mouth 2 (two) times daily. 180 tablet 3   Multiple Vitamin (MULTIVITAMIN) LIQD Take 5 mLs by mouth daily.     OXYGEN Inhale 3 L into the lungs continuous.     revefenacin (YUPELRI) 175 MCG/3ML nebulizer solution Take 3 mLs (175 mcg total) by nebulization daily. 90 mL 3   sevelamer carbonate (RENVELA) 0.8 g PACK packet Take 0.8 g by mouth daily as needed (snacks).     sevelamer carbonate (RENVELA) 2.4 g PACK Take 2.4 g by mouth 3 (three) times daily with meals.     docusate (COLACE) 50 MG/5ML liquid Take 10 mLs (100 mg total) by mouth 2 (two) times daily. (Patient not taking: Reported on 01/14/2024) 100 mL 0   polyethylene glycol (MIRALAX / GLYCOLAX) 17 g packet Take 17 g by mouth daily. (Patient not taking: Reported on 01/14/2024) 14 each 0   No facility-administered medications prior to visit.  Review of Systems:   Constitutional: No weight loss or gain, night sweats, fevers, chills +chronic fatigue HEENT: No headaches, difficulty swallowing, tooth/dental problems, or sore throat. No sneezing, itching, ear ache, nasal congestion, or post nasal drip CV:  No chest pain, orthopnea, PND, swelling in lower extremities, anasarca, dizziness, palpitations, syncope Resp: +shortness of breath with exertion; productive cough. No excess mucus or change in color of mucus. No hemoptysis. No wheezing.  No chest wall deformity GI:  No heartburn, indigestion Skin: No rash, lesions, ulcerations MSK:  No joint pain or swelling.   Neuro: No dizziness or lightheadedness.  Psych: No depression or anxiety. Mood stable.     Physical Exam:  BP (!) 150/56 (BP Location: Right Arm, Patient Position: Sitting, Cuff Size: Normal)   Pulse 99   Temp 97.9 F (36.6 C) (Oral)   Ht 5\' 10"  (1.778 m)   Wt 156 lb 6.4 oz (70.9 kg)   SpO2 99%   BMI 22.44 kg/m   GEN: Pleasant, interactive,  well-kempt; chronically-ill appearing/; in no acute distress HEENT:  Normocephalic and atraumatic. PERRLA. Sclera white. Nasal turbinates pink, moist and patent bilaterally. No rhinorrhea present. Oropharynx pink and moist, without exudate or edema. No lesions, ulcerations, or postnasal drip.  NECK:  Supple w/ fair ROM. No JVD present. Thyroid symmetrical with no goiter or nodules palpated. No lymphadenopathy.   CV: RRR, no m/r/g, no peripheral edema. Pulses intact, +2 bilaterally. No cyanosis, pallor or clubbing. PULMONARY:  Unlabored, regular breathing. Clear bilaterally A&P w/o wheezes/rales/rhonchi. No accessory muscle use.  GI: BS present and normoactive. Soft, non-tender to palpation. No organomegaly or masses detected.  MSK: No erythema, warmth or tenderness.  Neuro: A/Ox3. No focal deficits noted.   Skin: Warm, no lesions or rashe Psych: Normal affect and behavior. Judgement and thought content appropriate.     Lab Results:  CBC    Component Value Date/Time   WBC 12.1 (H) 08/19/2023 2131   RBC 4.09 (L) 08/19/2023 2131   HGB 7.5 (L) 10/31/2023 0903   HCT 22.0 (L) 10/31/2023 0903   PLT 313 08/19/2023 2131   MCV 98.5 08/19/2023 2131   MCH 32.5 08/19/2023 2131   MCHC 33.0 08/19/2023 2131   RDW 14.1 08/19/2023 2131   LYMPHSABS 1.0 08/19/2023 2131   MONOABS 1.1 (H) 08/19/2023 2131   EOSABS 0.1 08/19/2023 2131   BASOSABS 0.0 08/19/2023 2131    BMET    Component Value Date/Time   NA 141 10/31/2023 0903   K 3.6 10/31/2023 0903   CL 97 (L) 10/31/2023 0903   CO2 25 08/19/2023 2131   GLUCOSE 90 10/31/2023 0903   BUN 30 (H) 10/31/2023 0903   CREATININE 4.60 (H) 10/31/2023 0903   CREATININE 3.23 (H) 04/23/2022 0000   CALCIUM 9.0 08/19/2023 2131   GFRNONAA 11 (L) 08/19/2023 2131   GFRNONAA 38 (L) 12/12/2020 0000   GFRAA 44 (L) 12/12/2020 0000    BNP    Component Value Date/Time   BNP 319.7 (H) 08/19/2023 2131     Imaging:  DG Chest 2 View Result Date:  01/01/2024 CLINICAL DATA:  Dyspnea. COPD EXAM: CHEST - 2 VIEW COMPARISON:  09/22/2023. FINDINGS: The heart size and mediastinal contours are within normal limits. There is diffuse pulmonary interstitial prominence which could be seen with atypical infection, edema or interstitial lung disease. Lungs are hyperinflated consistent with COPD. There is no focal consolidation. No pneumothorax or pleural effusion. Aorta is calcified. The visualized osseous structures are unremarkable. IMPRESSION: Nonspecific interstitial prominence  consistent with atypical infection, edema or interstitial lung disease. Hyperinflated lungs consistent with COPD. No focal consolidation. Electronically Signed   By: Layla Maw M.D.   On: 01/01/2024 21:16    Administration History     None          Latest Ref Rng & Units 01/07/2024    8:49 AM  PFT Results  FVC-Pre L 2.88   FVC-Predicted Pre % 73   FVC-Post L 3.01   FVC-Predicted Post % 77   Pre FEV1/FVC % % 61   Post FEV1/FCV % % 59   FEV1-Pre L 1.75   FEV1-Predicted Pre % 63   FEV1-Post L 1.77   DLCO uncorrected ml/min/mmHg 7.86   DLCO UNC% % 32   DLCO corrected ml/min/mmHg 7.86   DLCO COR %Predicted % 32   DLVA Predicted % 45   TLC L 7.83   TLC % Predicted % 110   RV % Predicted % 189     No results found for: "NITRICOXIDE"      Assessment & Plan:   Chronic bronchitis (HCC) Moderate obstruction with chronic bronchitis symptoms. Frequent productive cough, which is baseline for him. Recent CXR with findings consistent with volume overload given hx of ESRD. He is awaiting HRCT to rule out ILD. No evidence of acute infectious process. Encouraged him to continue current regimen. Discussed inhaled phosphodiesterase inhibitor with Ohtuvayre. Side effect profile reviewed. Shared decision to start this. Educated on proper use. Action plan in place. Graded exercises encouraged as tolerated.   Patient Instructions  Continue budesonide 2 mL neb Twice  daily. Brush tongue and rinse mouth afterwards Continue arformoterol 2 mL neb Twice daily  Continue Yupelri 3 mL daily Continue Albuterol inhaler 2 puffs or 3 mL neb every 6 hours as needed for shortness of breath or wheezing. Notify if symptoms persist despite rescue inhaler/neb use.  Continue supplemental oxygen 3 lpm for goal >88-90% Restart CPAP nightly   Start ohtuvayre 2.5 mL neb Twice daily. Monitor for any mood changes and stop and notify if these occur   Chest x ray today I think your last x ray was probably showing some changes consistent with fluid overload as you were in between dialysis treatments but we will repeat today. The high resolution CT chest will also help evaluate this. I'll follow up with the patient care coordinators on scheduling this  Follow up in 6 weeks with Dr. Wynona Neat to review CT results and see how new medication is going. If symptoms do not improve or worsen, please contact office for sooner follow up or seek emergency care.    Chronic respiratory failure with hypoxia (HCC) Stable without increased O2 requirements. No low levels at home recently. Able to maintain >88-90% on 3 lpm.   OSA (obstructive sleep apnea) Yet to resume CPAP. Encouraged to restart therapy for treatment of severe OSA. Aware of risks of untreated severe OSA.   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 See above. Euvolemic on exam today.    Advised if symptoms do not improve or worsen, to please contact office for sooner follow up or seek emergency care.   I spent 35 minutes of dedicated to the care of this patient on the date of this encounter to include pre-visit review of records, face-to-face time with the patient discussing conditions above, post visit ordering of testing, clinical documentation with the electronic health record, making appropriate referrals as documented, and communicating necessary findings to members of the patients care team.  Noemi Chapel,  [name] 01/16/2024  Pt aware and understands [name]'s role.

## 2024-01-14 NOTE — Telephone Encounter (Signed)
 Duplicate encounter

## 2024-01-14 NOTE — Telephone Encounter (Signed)
 Ohtuvayre paperwork initiated and put in pharmacy box at front.

## 2024-01-15 DIAGNOSIS — D631 Anemia in chronic kidney disease: Secondary | ICD-10-CM | POA: Diagnosis not present

## 2024-01-15 DIAGNOSIS — Z992 Dependence on renal dialysis: Secondary | ICD-10-CM | POA: Diagnosis not present

## 2024-01-15 DIAGNOSIS — D509 Iron deficiency anemia, unspecified: Secondary | ICD-10-CM | POA: Diagnosis not present

## 2024-01-15 DIAGNOSIS — N2581 Secondary hyperparathyroidism of renal origin: Secondary | ICD-10-CM | POA: Diagnosis not present

## 2024-01-15 DIAGNOSIS — N186 End stage renal disease: Secondary | ICD-10-CM | POA: Diagnosis not present

## 2024-01-16 ENCOUNTER — Encounter: Payer: Self-pay | Admitting: Nurse Practitioner

## 2024-01-16 NOTE — Assessment & Plan Note (Signed)
 Yet to resume CPAP. Encouraged to restart therapy for treatment of severe OSA. Aware of risks of untreated severe OSA.

## 2024-01-16 NOTE — Assessment & Plan Note (Signed)
 Stable without increased O2 requirements. No low levels at home recently. Able to maintain >88-90% on 3 lpm.

## 2024-01-16 NOTE — Assessment & Plan Note (Signed)
 HD T/Th/Sat. See above. Euvolemic on exam today.

## 2024-01-16 NOTE — Assessment & Plan Note (Signed)
 Moderate obstruction with chronic bronchitis symptoms. Frequent productive cough, which is baseline for him. Recent CXR with findings consistent with volume overload given hx of ESRD. He is awaiting HRCT to rule out ILD. No evidence of acute infectious process. Encouraged him to continue current regimen. Discussed inhaled phosphodiesterase inhibitor with Ohtuvayre. Side effect profile reviewed. Shared decision to start this. Educated on proper use. Action plan in place. Graded exercises encouraged as tolerated.   Patient Instructions  Continue budesonide 2 mL neb Twice daily. Brush tongue and rinse mouth afterwards Continue arformoterol 2 mL neb Twice daily  Continue Yupelri 3 mL daily Continue Albuterol inhaler 2 puffs or 3 mL neb every 6 hours as needed for shortness of breath or wheezing. Notify if symptoms persist despite rescue inhaler/neb use.  Continue supplemental oxygen 3 lpm for goal >88-90% Restart CPAP nightly   Start ohtuvayre 2.5 mL neb Twice daily. Monitor for any mood changes and stop and notify if these occur   Chest x ray today I think your last x ray was probably showing some changes consistent with fluid overload as you were in between dialysis treatments but we will repeat today. The high resolution CT chest will also help evaluate this. I'll follow up with the patient care coordinators on scheduling this  Follow up in 6 weeks with Dr. Wynona Neat to review CT results and see how new medication is going. If symptoms do not improve or worsen, please contact office for sooner follow up or seek emergency care.

## 2024-01-17 DIAGNOSIS — D631 Anemia in chronic kidney disease: Secondary | ICD-10-CM | POA: Diagnosis not present

## 2024-01-17 DIAGNOSIS — N186 End stage renal disease: Secondary | ICD-10-CM | POA: Diagnosis not present

## 2024-01-17 DIAGNOSIS — N2581 Secondary hyperparathyroidism of renal origin: Secondary | ICD-10-CM | POA: Diagnosis not present

## 2024-01-17 DIAGNOSIS — Z992 Dependence on renal dialysis: Secondary | ICD-10-CM | POA: Diagnosis not present

## 2024-01-17 DIAGNOSIS — D509 Iron deficiency anemia, unspecified: Secondary | ICD-10-CM | POA: Diagnosis not present

## 2024-01-19 ENCOUNTER — Encounter

## 2024-01-19 VITALS — Ht 68.5 in | Wt 160.4 lb

## 2024-01-19 DIAGNOSIS — J449 Chronic obstructive pulmonary disease, unspecified: Secondary | ICD-10-CM

## 2024-01-19 NOTE — Progress Notes (Signed)
 Pulmonary Individual Treatment Plan  Patient Details  Name: Jeremy [name] MRN: [id number] Date of Birth: Jun 30, 1941 Referring Provider:   Flowsheet Row Pulmonary Rehab from 01/19/2024 in [facility] Cardiac and Pulmonary Rehab  Referring Provider Dr. Virl Diamond, MD       Initial Encounter Date:  Flowsheet Row Pulmonary Rehab from 01/19/2024 in [facility] Cardiac and Pulmonary Rehab  Date 01/19/24       Visit Diagnosis: Chronic obstructive pulmonary disease, unspecified COPD type (HCC)  Patient's Home Medications on Admission:  Current Outpatient Medications:    AMBULATORY NON FORMULARY MEDICATION, Please provide portable O2 concentrator for patient. Flow 2L/min, 24/7 use.  J96.10-Chronic respiratory failure., Disp: 1 Device, Rfl: 0   AMBULATORY NON FORMULARY MEDICATION, Please provide portable oxygen concentrator.  Flow 3L/min.  Dx: Chronic respiratory failure J96.11, COPD J42, Disp: 1 Device, Rfl: 0   arformoterol (BROVANA) 15 MCG/2ML NEBU, Take 2 mLs (15 mcg total) by nebulization 2 (two) times daily., Disp: 120 mL, Rfl: 3   budesonide (PULMICORT) 0.5 MG/2ML nebulizer solution, Take 2 mLs (0.5 mg total) by nebulization 2 (two) times daily., Disp: 120 mL, Rfl: 3   Cholecalciferol (VITAMIN D) 50 MCG (2000 UT) CAPS, Take 2,000 Units by mouth daily., Disp: , Rfl:    docusate (COLACE) 50 MG/5ML liquid, Take 10 mLs (100 mg total) by mouth 2 (two) times daily. (Patient not taking: Reported on 01/14/2024), Disp: 100 mL, Rfl: 0   Ensifentrine (OHTUVAYRE) 3 MG/2.5ML SUSP, Inhale 2.5 mLs into the lungs 2 (two) times daily., Disp: , Rfl:    fluticasone (FLONASE) 50 MCG/ACT nasal spray, Place 1 spray into both nostrils daily as needed for allergies or rhinitis., Disp: , Rfl:    guaiFENesin (ROBITUSSIN) 100 MG/5ML liquid, Take 20 mLs by mouth 2 (two) times daily. (Patient taking differently: Take 20 mLs by mouth 2 (two) times daily. Dm Max), Disp: 120 mL, Rfl: 0   ipratropium (ATROVENT) 0.03 % nasal spray,  Place 2 sprays into both nostrils 2 (two) times daily., Disp: 30 mL, Rfl: 2   metoprolol tartrate (LOPRESSOR) 25 MG tablet, Take 1 tablet (25 mg total) by mouth 2 (two) times daily., Disp: 180 tablet, Rfl: 3   Multiple Vitamin (MULTIVITAMIN) LIQD, Take 5 mLs by mouth daily., Disp: , Rfl:    OXYGEN, Inhale 3 L into the lungs continuous., Disp: , Rfl:    polyethylene glycol (MIRALAX / GLYCOLAX) 17 g packet, Take 17 g by mouth daily. (Patient not taking: Reported on 01/14/2024), Disp: 14 each, Rfl: 0   revefenacin (YUPELRI) 175 MCG/3ML nebulizer solution, Take 3 mLs (175 mcg total) by nebulization daily., Disp: 90 mL, Rfl: 3   sevelamer carbonate (RENVELA) 0.8 g PACK packet, Take 0.8 g by mouth daily as needed (snacks)., Disp: , Rfl:    sevelamer carbonate (RENVELA) 2.4 g PACK, Take 2.4 g by mouth 3 (three) times daily with meals., Disp: , Rfl:   Past Medical History: Past Medical History:  Diagnosis Date   Anemia    Cancer (HCC)    patient and family have not been told that he has cancer.   Chronic kidney disease    dialysis T/Th/Sa   COPD (chronic obstructive pulmonary disease) (HCC)    Enlarged prostate    History of blood transfusion    Hyperlipidemia    Hypertension    Self-catheterizes urinary bladder    pt. does this morning and evening--been doing this since 11/2016   Sleep apnea    not using a cpap machine  Tobacco Use: Social History   Tobacco Use  Smoking Status Former   Current packs/day: 0.00   Average packs/day: 0.2 packs/day for 61.5 years (12.3 ttl pk-yrs)   Types: Cigarettes   Start date: 12/29/1961   Quit date: 07/2023   Years since quitting: 0.5   Passive exposure: Never  Smokeless Tobacco Never  Tobacco Comments   Smokes 1/2 pack a day    Pt states he quit smoking 2 months ago. AB, CMA 10-07-23    Labs: Review Flowsheet  More data exists      Latest Ref Rng & Units 03/17/2023 08/10/2023 08/14/2023 08/15/2023 10/31/2023  Labs for ITP Cardiac and Pulmonary  Rehab  Hemoglobin A1c 4.8 - 5.6 % - 5.5  - - -  PH, Arterial 7.35 - 7.45 - 7.221  7.36  7.31  -  PCO2 arterial 32 - 48 mmHg - 76.3  51  61  -  Bicarbonate 20.0 - 28.0 mmol/L - 24.0  31.3  32.6  28.7  30.7  -  TCO2 22 - 32 mmol/L 29  34  35  - - 32   Acid-base deficit 0.0 - 2.0 mmol/L - 0.2  - - -  O2 Saturation % - 92.6  99  61  93.4  94.7  -    Details       Multiple values from one day are sorted in reverse-chronological order          Pulmonary Assessment Scores:   UCSD: Self-administered rating of dyspnea associated with activities of daily living (ADLs) 6-point scale (0 = "not at all" to 5 = "maximal or unable to do because of breathlessness")  Scoring Scores range from 0 to 120.  Minimally important difference is 5 units  CAT: CAT can identify the health impairment of COPD patients and is better correlated with disease progression.  CAT has a scoring range of zero to 40. The CAT score is classified into four groups of low (less than 10), medium (10 - 20), high (21-30) and very high (31-40) based on the impact level of disease on health status. A CAT score over 10 suggests significant symptoms.  A worsening CAT score could be explained by an exacerbation, poor medication adherence, poor inhaler technique, or progression of COPD or comorbid conditions.  CAT MCID is 2 points  mMRC: mMRC (Modified Medical Research Council) Dyspnea Scale is used to assess the degree of baseline functional disability in patients of respiratory disease due to dyspnea. No minimal important difference is established. A decrease in score of 1 point or greater is considered a positive change.   Pulmonary Function Assessment:  Pulmonary Function Assessment - 01/19/24 1607       Pulmonary Function Tests   FVC% 77 %    FEV1% 64 %    FEV1/FVC Ratio 59   Date of PFT: 01/07/2024            Exercise Target Goals: Exercise Program Goal: Individual exercise prescription set using results from  initial 6 min walk test and THRR while considering  patient's activity barriers and safety.   Exercise Prescription Goal: Initial exercise prescription builds to 30-45 minutes a day of aerobic activity, 2-3 days per week.  Home exercise guidelines will be given to patient during program as part of exercise prescription that the participant will acknowledge.  Education: Aerobic Exercise: - Group verbal and visual presentation on the components of exercise prescription. Introduces F.I.T.T principle from ACSM for exercise prescriptions.  Reviews F.I.T.T. principles  of aerobic exercise including progression. Written material given at graduation.   Education: Resistance Exercise: - Group verbal and visual presentation on the components of exercise prescription. Introduces F.I.T.T principle from ACSM for exercise prescriptions  Reviews F.I.T.T. principles of resistance exercise including progression. Written material given at graduation.    Education: Exercise & Equipment Safety: - Individual verbal instruction and demonstration of equipment use and safety with use of the equipment. Flowsheet Row Pulmonary Rehab from 01/19/2024 in [facility] Cardiac and Pulmonary Rehab  Date 01/19/24  Educator NT  Instruction Review Code 1- Verbalizes Understanding       Education: Exercise Physiology & General Exercise Guidelines: - Group verbal and written instruction with models to review the exercise physiology of the cardiovascular system and associated critical values. Provides general exercise guidelines with specific guidelines to those with heart or lung disease.    Education: Flexibility, Balance, Mind/Body Relaxation: - Group verbal and visual presentation with interactive activity on the components of exercise prescription. Introduces F.I.T.T principle from ACSM for exercise prescriptions. Reviews F.I.T.T. principles of flexibility and balance exercise training including progression. Also discusses the  mind body connection.  Reviews various relaxation techniques to help reduce and manage stress (i.e. Deep breathing, progressive muscle relaxation, and visualization). Balance handout provided to take home. Written material given at graduation.   Activity Barriers & Risk Stratification:  Activity Barriers & Cardiac Risk Stratification - 01/19/24 1556       Activity Barriers & Cardiac Risk Stratification   Activity Barriers Balance Concerns;Deconditioning;Shortness of Breath;Back Problems             6 Minute Walk:  6 Minute Walk     Row Name 01/19/24 1552         6 Minute Walk   Phase Initial     Distance 670 feet     Walk Time 4.33 minutes     # of Rest Breaks 1     MPH 1.76     METS 1.3     RPE 11     Perceived Dyspnea  0     VO2 Peak 4.55     Symptoms Yes (comment)     Comments fatigue     Resting HR 74 bpm     Resting BP 148/66     Resting Oxygen Saturation  96 %     Exercise Oxygen Saturation  during 6 min walk 87 %     Max Ex. HR 85 bpm     Max Ex. BP 150/64     2 Minute Post BP 132/66       Interval HR   1 Minute HR 79     2 Minute HR 85     3 Minute HR 84     4 Minute HR 80     5 Minute HR 80     6 Minute HR 83     2 Minute Post HR 69     Interval Heart Rate? Yes       Interval Oxygen   Interval Oxygen? Yes     Baseline Oxygen Saturation % 96 %     1 Minute Oxygen Saturation % 90 %     1 Minute Liters of Oxygen 3 L     2 Minute Oxygen Saturation % 91 %     2 Minute Liters of Oxygen 3 L     3 Minute Oxygen Saturation % 88 %     3 Minute Liters of Oxygen 3 L  4 Minute Oxygen Saturation % 88 %     4 Minute Liters of Oxygen 3 L     5 Minute Oxygen Saturation % 88 %     5 Minute Liters of Oxygen 3 L     6 Minute Oxygen Saturation % 87 %     6 Minute Liters of Oxygen 3 L     2 Minute Post Oxygen Saturation % 93 %     2 Minute Post Liters of Oxygen 3 L             Oxygen Initial Assessment:  Oxygen Initial Assessment - 01/14/24 1135        Home Oxygen   Home Oxygen Device Home Concentrator    Sleep Oxygen Prescription Continuous    Liters per minute 3    Home Exercise Oxygen Prescription Continuous    Liters per minute 3    Home Resting Oxygen Prescription Continuous    Liters per minute 3    Compliance with Home Oxygen Use Yes      Intervention   Short Term Goals To learn and understand importance of monitoring SPO2 with pulse oximeter and demonstrate accurate use of the pulse oximeter.;To learn and understand importance of maintaining oxygen saturations>88%;To learn and demonstrate proper pursed lip breathing techniques or other breathing techniques. ;To learn and demonstrate proper use of respiratory medications    Long  Term Goals Verbalizes importance of monitoring SPO2 with pulse oximeter and return demonstration;Maintenance of O2 saturations>88%;Exhibits proper breathing techniques, such as pursed lip breathing or other method taught during program session;Compliance with respiratory medication;Demonstrates proper use of MDI's             Oxygen Re-Evaluation:   Oxygen Discharge (Final Oxygen Re-Evaluation):   Initial Exercise Prescription:  Initial Exercise Prescription - 01/19/24 1600       Date of Initial Exercise RX and Referring Provider   Date 01/19/24    Referring Provider Dr. Virl Diamond, MD      Oxygen   Oxygen Continuous    Liters 3    Maintain Oxygen Saturation 88% or higher      Recumbant Bike   Level 1    RPM 50    Watts 15    Minutes 15    METs 1.3      NuStep   Level 1    SPM 80    Minutes 15    METs 1.3      Track   Laps 15    Minutes 15    METs 1.82      Prescription Details   Frequency (times per week) 3    Duration Progress to 30 minutes of continuous aerobic without signs/symptoms of physical distress      Intensity   THRR 40-80% of Max Heartrate 99-124    Ratings of Perceived Exertion 11-13    Perceived Dyspnea 0-4      Progression   Progression  Continue to progress workloads to maintain intensity without signs/symptoms of physical distress.      Resistance Training   Training Prescription Yes    Weight 3 lb    Reps 10-15             Perform Capillary Blood Glucose checks as needed.  Exercise Prescription Changes:   Exercise Prescription Changes     Row Name 01/19/24 1600             Response to Exercise   Blood Pressure (Admit) 148/66  Blood Pressure (Exercise) 150/64       Blood Pressure (Exit) 132/66       Heart Rate (Admit) 74 bpm       Heart Rate (Exercise) 85 bpm       Heart Rate (Exit) 69 bpm       Oxygen Saturation (Admit) 96 %       Oxygen Saturation (Exercise) 87 %       Oxygen Saturation (Exit) 93 %       Rating of Perceived Exertion (Exercise) 11       Perceived Dyspnea (Exercise) 0       Symptoms fatigue       Comments Results                Exercise Comments:   Exercise Goals and Review:   Exercise Goals     Row Name 01/19/24 1556             Exercise Goals   Increase Strength and Stamina Yes       Intervention Provide advice, education, support and counseling about physical activity/exercise needs.;Develop an individualized exercise prescription for aerobic and resistive training based on initial evaluation findings, risk stratification, comorbidities and participant's personal goals.       Expected Outcomes Short Term: Increase workloads from initial exercise prescription for resistance, speed, and METs.;Short Term: Perform resistance training exercises routinely during rehab and add in resistance training at home;Long Term: Improve cardiorespiratory fitness, muscular endurance and strength as measured by increased METs and functional capacity ( )       Able to understand and use rate of perceived exertion (RPE) scale Yes       Intervention Provide education and explanation on how to use RPE scale       Expected Outcomes Short Term: Able to use RPE daily in rehab  to express subjective intensity level;Long Term:  Able to use RPE to guide intensity level when exercising independently       Able to understand and use Dyspnea scale Yes       Intervention Provide education and explanation on how to use Dyspnea scale       Expected Outcomes Short Term: Able to use Dyspnea scale daily in rehab to express subjective sense of shortness of breath during exertion;Long Term: Able to use Dyspnea scale to guide intensity level when exercising independently       Knowledge and understanding of Target Heart Rate Range (THRR) Yes       Intervention Provide education and explanation of THRR including how the numbers were predicted and where they are located for reference       Expected Outcomes Short Term: Able to use daily as guideline for intensity in rehab;Long Term: Able to use THRR to govern intensity when exercising independently;Short Term: Able to state/look up THRR       Able to check pulse independently Yes       Intervention Provide education and demonstration on how to check pulse in carotid and radial arteries.;Review the importance of being able to check your own pulse for safety during independent exercise       Expected Outcomes Long Term: Able to check pulse independently and accurately;Short Term: Able to explain why pulse checking is important during independent exercise       Understanding of Exercise Prescription Yes       Intervention Provide education, explanation, and written materials on patient's individual exercise prescription  Expected Outcomes Short Term: Able to explain program exercise prescription;Long Term: Able to explain home exercise prescription to exercise independently                Exercise Goals Re-Evaluation :   Discharge Exercise Prescription (Final Exercise Prescription Changes):  Exercise Prescription Changes - 01/19/24 1600       Response to Exercise   Blood Pressure (Admit) 148/66    Blood Pressure (Exercise)  150/64    Blood Pressure (Exit) 132/66    Heart Rate (Admit) 74 bpm    Heart Rate (Exercise) 85 bpm    Heart Rate (Exit) 69 bpm    Oxygen Saturation (Admit) 96 %    Oxygen Saturation (Exercise) 87 %    Oxygen Saturation (Exit) 93 %    Rating of Perceived Exertion (Exercise) 11    Perceived Dyspnea (Exercise) 0    Symptoms fatigue    Comments Results             Nutrition:  Target Goals: Understanding of nutrition guidelines, daily intake of sodium 1500mg , cholesterol 200mg , calories 30% from fat and 7% or less from saturated fats, daily to have 5 or more servings of fruits and vegetables.  Education: All About Nutrition: -Group instruction provided by verbal, written material, interactive activities, discussions, models, and posters to present general guidelines for heart healthy nutrition including fat, fiber, MyPlate, the role of sodium in heart healthy nutrition, utilization of the nutrition label, and utilization of this knowledge for meal planning. Follow up email sent as well. Written material given at graduation.   Biometrics:  Pre Biometrics - 01/19/24 1556       Pre Biometrics   Height 5' 8.5" (1.74 m)    Weight 160 lb 6.4 oz (72.8 kg)    BMI (Calculated) 24.03    Single Leg Stand 0 seconds              Nutrition Therapy Plan and Nutrition Goals:  Nutrition Therapy & Goals - 01/19/24 1543       Nutrition Therapy   RD appointment deferred Yes      Intervention Plan   Intervention Prescribe, educate and counsel regarding individualized specific dietary modifications aiming towards targeted core components such as weight, hypertension, lipid management, diabetes, heart failure and other comorbidities.    Expected Outcomes Short Term Goal: A plan has been developed with personal nutrition goals set during dietitian appointment.;Short Term Goal: Understand basic principles of dietary content, such as calories, fat, sodium, cholesterol and nutrients.              Nutrition Assessments:  MEDIFICTS Score Key: >=70 Need to make dietary changes  40-70 Heart Healthy Diet <= 40 Therapeutic Level Cholesterol Diet   Picture Your Plate Scores: <16 Unhealthy dietary pattern with much room for improvement. 41-50 Dietary pattern unlikely to meet recommendations for good health and room for improvement. 51-60 More healthful dietary pattern, with some room for improvement.  >60 Healthy dietary pattern, although there may be some specific behaviors that could be improved.   Nutrition Goals Re-Evaluation:   Nutrition Goals Discharge (Final Nutrition Goals Re-Evaluation):   Psychosocial: Target Goals: Acknowledge presence or absence of significant depression and/or stress, maximize coping skills, provide positive support system. Participant is able to verbalize types and ability to use techniques and skills needed for reducing stress and depression.   Education: Stress, Anxiety, and Depression - Group verbal and visual presentation to define topics covered.  Reviews how body is  impacted by stress, anxiety, and depression.  Also discusses healthy ways to reduce stress and to treat/manage anxiety and depression.  Written material given at graduation.   Education: Sleep Hygiene -Provides group verbal and written instruction about how sleep can affect your health.  Define sleep hygiene, discuss sleep cycles and impact of sleep habits. Review good sleep hygiene tips.    Initial Review & Psychosocial Screening:  Initial Psych Review & Screening - 01/14/24 1137       Initial Review   Current issues with Current Stress Concerns    Source of Stress Concerns Unable to participate in former interests or hobbies;Unable to perform yard/household activities;Chronic Illness      Family Dynamics   Good Support System? Yes   wife, family     Barriers   Psychosocial barriers to participate in program There are no identifiable barriers or  psychosocial needs.;The patient should benefit from training in stress management and relaxation.      Screening Interventions   Interventions Encouraged to exercise;Provide feedback about the scores to participant;To provide support and resources with identified psychosocial needs    Expected Outcomes Short Term goal: Utilizing psychosocial counselor, staff and physician to assist with identification of specific Stressors or current issues interfering with healing process. Setting desired goal for each stressor or current issue identified.;Long Term Goal: Stressors or current issues are controlled or eliminated.;Short Term goal: Identification and review with participant of any Quality of Life or Depression concerns found by scoring the questionnaire.;Long Term goal: The participant improves quality of Life and PHQ9 Scores as seen by post scores and/or verbalization of changes             Quality of Life Scores:  Scores of 19 and below usually indicate a poorer quality of life in these areas.  A difference of  2-3 points is a clinically meaningful difference.  A difference of 2-3 points in the total score of the Quality of Life Index has been associated with significant improvement in overall quality of life, self-image, physical symptoms, and general health in studies assessing change in quality of life.  PHQ-9: Review Flowsheet  More data exists      01/19/2024 05/19/2023 04/23/2022 01/08/2022 08/10/2020  Depression screen PHQ 2/9  Decreased Interest 1 0 0 2 0  Down, Depressed, Hopeless 1 0 0 2 0  PHQ - 2 Score 2 0 0 4 0  Altered sleeping 1 - - - 0  Tired, decreased energy 1 - - - 0  Change in appetite 0 - - - 0  Feeling bad or failure about yourself  0 - - - 0  Trouble concentrating 1 - - - 0  Moving slowly or fidgety/restless 0 - - - 0  Suicidal thoughts 0 - - - 0  PHQ-9 Score 5 - - - 0  Difficult doing work/chores Not difficult at all - - - -   Interpretation of Total Score  Total  Score Depression Severity:  1-4 = Minimal depression, 5-9 = Mild depression, 10-14 = Moderate depression, 15-19 = Moderately severe depression, 20-27 = Severe depression   Psychosocial Evaluation and Intervention:  Psychosocial Evaluation - 01/14/24 1152       Psychosocial Evaluation & Interventions   Interventions Encouraged to exercise with the program and follow exercise prescription;Stress management education;Relaxation education    Comments Jeremy [name] is coming to pulmonary rehab with COPD. His wife notes that his stamina, breathing, and strength have gotten worse in the last year. They  do have an aide that comes to help them around the house and transportation. He states he doesn't feel short of breath a lot, but knows that he is a mouth breather. He wears his oxygen as he should, but they have been having issues getting the correct canister sizes and it has been stressful trying to figure it out. He wants to get back to golfing and enjoys being outside. When asked about stress, he mentions he is getting old and not able to do what he once was, but that this program sounds like it will be helpful for him.    Expected Outcomes Short: attend pulmonary rehab for education and exercise. Long: develop and maintain positive self care habits    Continue Psychosocial Services  Follow up required by staff             Psychosocial Re-Evaluation:   Psychosocial Discharge (Final Psychosocial Re-Evaluation):   Education: Education Goals: Education classes will be provided on a weekly basis, covering required topics. Participant will state understanding/return demonstration of topics presented.  Learning Barriers/Preferences:  Learning Barriers/Preferences - 01/14/24 1139       Learning Barriers/Preferences   Learning Barriers None    Learning Preferences Individual Instruction             General Pulmonary Education Topics:  Infection Prevention: - Provides verbal and written  material to individual with discussion of infection control including proper hand washing and proper equipment cleaning during exercise session. Flowsheet Row Pulmonary Rehab from 01/19/2024 in [facility] Cardiac and Pulmonary Rehab  Date 01/19/24  Educator NT  Instruction Review Code 1- Verbalizes Understanding       Falls Prevention: - Provides verbal and written material to individual with discussion of falls prevention and safety. Flowsheet Row Pulmonary Rehab from 01/19/2024 in [facility] Cardiac and Pulmonary Rehab  Date 01/19/24  Educator NT  Instruction Review Code 1- Verbalizes Understanding       Chronic Lung Disease Review: - Group verbal instruction with posters, models, PowerPoint presentations and videos,  to review new updates, new respiratory medications, new advancements in procedures and treatments. Providing information on websites and "800" numbers for continued self-education. Includes information about supplement oxygen, available portable oxygen systems, continuous and intermittent flow rates, oxygen safety, concentrators, and Medicare reimbursement for oxygen. Explanation of Pulmonary Drugs, including class, frequency, complications, importance of spacers, rinsing mouth after steroid MDI's, and proper cleaning methods for nebulizers. Review of basic lung anatomy and physiology related to function, structure, and complications of lung disease. Review of risk factors. Discussion about methods for diagnosing sleep apnea and types of masks and machines for OSA. Includes a review of the use of types of environmental controls: home humidity, furnaces, filters, dust mite/pet prevention, HEPA vacuums. Discussion about weather changes, air quality and the benefits of nasal washing. Instruction on Warning signs, infection symptoms, calling MD promptly, preventive modes, and value of vaccinations. Review of effective airway clearance, coughing and/or vibration techniques. Emphasizing that all  should Create an Action Plan. Written material given at graduation.   AED/CPR: - Group verbal and written instruction with the use of models to demonstrate the basic use of the AED with the basic ABC's of resuscitation.    Anatomy and Cardiac Procedures: - Group verbal and visual presentation and models provide information about basic cardiac anatomy and function. Reviews the testing methods done to diagnose heart disease and the outcomes of the test results. Describes the treatment choices: Medical Management, Angioplasty, or Coronary Bypass Surgery for  treating various heart conditions including Myocardial Infarction, Angina, Valve Disease, and Cardiac Arrhythmias.  Written material given at graduation.   Medication Safety: - Group verbal and visual instruction to review commonly prescribed medications for heart and lung disease. Reviews the medication, class of the drug, and side effects. Includes the steps to properly store meds and maintain the prescription regimen.  Written material given at graduation.   Other: -Provides group and verbal instruction on various topics (see comments)   Knowledge Questionnaire Score:    Core Components/Risk Factors/Patient Goals at Admission:  Personal Goals and Risk Factors at Admission - 01/14/24 1135       Core Components/Risk Factors/Patient Goals on Admission   Improve shortness of breath with ADL's Yes    Intervention Provide education, individualized exercise plan and daily activity instruction to help decrease symptoms of SOB with activities of daily living.    Expected Outcomes Short Term: Improve cardiorespiratory fitness to achieve a reduction of symptoms when performing ADLs;Long Term: Be able to perform more ADLs without symptoms or delay the onset of symptoms    Hypertension Yes    Intervention Provide education on lifestyle modifcations including regular physical activity/exercise, weight management, moderate sodium restriction and  increased consumption of fresh fruit, vegetables, and low fat dairy, alcohol moderation, and smoking cessation.;Monitor prescription use compliance.    Expected Outcomes Short Term: Continued assessment and intervention until BP is < 140/49mm HG in hypertensive participants. < 130/6mm HG in hypertensive participants with diabetes, heart failure or chronic kidney disease.;Long Term: Maintenance of blood pressure at goal levels.    Lipids Yes    Intervention Provide education and support for participant on nutrition & aerobic/resistive exercise along with prescribed medications to achieve LDL 70mg , HDL >40mg .    Expected Outcomes Short Term: Participant states understanding of desired cholesterol values and is compliant with medications prescribed. Participant is following exercise prescription and nutrition guidelines.;Long Term: Cholesterol controlled with medications as prescribed, with individualized exercise RX and with personalized nutrition plan. Value goals: LDL < 70mg , HDL > 40 mg.             Education:Diabetes - Individual verbal and written instruction to review signs/symptoms of diabetes, desired ranges of glucose level fasting, after meals and with exercise. Acknowledge that pre and post exercise glucose checks will be done for 3 sessions at entry of program.   Know Your Numbers and Heart Failure: - Group verbal and visual instruction to discuss disease risk factors for cardiac and pulmonary disease and treatment options.  Reviews associated critical values for Overweight/Obesity, Hypertension, Cholesterol, and Diabetes.  Discusses basics of heart failure: signs/symptoms and treatments.  Introduces Heart Failure Zone chart for action plan for heart failure.  Written material given at graduation.   Core Components/Risk Factors/Patient Goals Review:    Core Components/Risk Factors/Patient Goals at Discharge (Final Review):    ITP Comments:  ITP Comments     Row Name 01/14/24  1147 01/19/24 1528         ITP Comments Initial phone call completed. Diagnosis can be found in [facility] 2/26. EP Orientation scheduled for Monday 3/10 at 10:30. Completed and gym orientation. Initial ITP created and sent for review to Dr. Jinny Sanders, Medical Director.               Comments: Initial ITP

## 2024-01-19 NOTE — Patient Instructions (Addendum)
 Patient Instructions  Patient Details  Name: Jeremy [name] MRN: [id number] Date of Birth: 03/29/1941 Referring Provider:  Tomma Lightning, MD  Below are your personal goals for exercise, nutrition, and risk factors. Our goal is to help you stay on track towards obtaining and maintaining these goals. We will be discussing your progress on these goals with you throughout the program.  Initial Exercise Prescription:  Initial Exercise Prescription - 01/19/24 1600       Date of Initial Exercise RX and Referring Provider   Date 01/19/24    Referring Provider Dr. Virl Diamond, MD      Oxygen   Oxygen Continuous    Liters 3    Maintain Oxygen Saturation 88% or higher      Recumbant Bike   Level 1    RPM 50    Watts 15    Minutes 15    METs 1.3      NuStep   Level 1    SPM 80    Minutes 15    METs 1.3      Track   Laps 15    Minutes 15    METs 1.82      Prescription Details   Frequency (times per week) 3    Duration Progress to 30 minutes of continuous aerobic without signs/symptoms of physical distress      Intensity   THRR 40-80% of Max Heartrate 99-124    Ratings of Perceived Exertion 11-13    Perceived Dyspnea 0-4      Progression   Progression Continue to progress workloads to maintain intensity without signs/symptoms of physical distress.      Resistance Training   Training Prescription Yes    Weight 3 lb    Reps 10-15             Exercise Goals: Frequency: Be able to perform aerobic exercise two to three times per week in program working toward 2-5 days per week of home exercise.  Intensity: Work with a perceived exertion of 11 (fairly light) - 15 (hard) while following your exercise prescription.  We will make changes to your prescription with you as you progress through the program.   Duration: Be able to do 30 to 45 minutes of continuous aerobic exercise in addition to a 5 minute warm-up and a 5 minute cool-down routine.   Nutrition  Goals: Your personal nutrition goals will be established when you do your nutrition analysis with the dietician.  The following are general nutrition guidelines to follow: Cholesterol < 200mg /day Sodium < 1500mg /day Fiber: Men over 50 yrs - 30 grams per day  Personal Goals:  Personal Goals and Risk Factors at Admission - 01/14/24 1135       Core Components/Risk Factors/Patient Goals on Admission   Improve shortness of breath with ADL's Yes    Intervention Provide education, individualized exercise plan and daily activity instruction to help decrease symptoms of SOB with activities of daily living.    Expected Outcomes Short Term: Improve cardiorespiratory fitness to achieve a reduction of symptoms when performing ADLs;Long Term: Be able to perform more ADLs without symptoms or delay the onset of symptoms    Hypertension Yes    Intervention Provide education on lifestyle modifcations including regular physical activity/exercise, weight management, moderate sodium restriction and increased consumption of fresh fruit, vegetables, and low fat dairy, alcohol moderation, and smoking cessation.;Monitor prescription use compliance.    Expected Outcomes Short Term: Continued assessment and intervention until BP is <  140/33mm HG in hypertensive participants. < 130/49mm HG in hypertensive participants with diabetes, heart failure or chronic kidney disease.;Long Term: Maintenance of blood pressure at goal levels.    Lipids Yes    Intervention Provide education and support for participant on nutrition & aerobic/resistive exercise along with prescribed medications to achieve LDL 70mg , HDL >40mg .    Expected Outcomes Short Term: Participant states understanding of desired cholesterol values and is compliant with medications prescribed. Participant is following exercise prescription and nutrition guidelines.;Long Term: Cholesterol controlled with medications as prescribed, with individualized exercise RX and  with personalized nutrition plan. Value goals: LDL < 70mg , HDL > 40 mg.             Tobacco Use Initial Evaluation: Social History   Tobacco Use  Smoking Status Former   Current packs/day: 0.00   Average packs/day: 0.2 packs/day for 61.5 years (12.3 ttl pk-yrs)   Types: Cigarettes   Start date: 12/29/1961   Quit date: 07/2023   Years since quitting: 0.5   Passive exposure: Never  Smokeless Tobacco Never  Tobacco Comments   Smokes 1/2 pack a day    Pt states he quit smoking 2 months ago. AB, CMA 10-07-23    Exercise Goals and Review:  Exercise Goals     Row Name 01/19/24 1556             Exercise Goals   Increase Strength and Stamina Yes       Intervention Provide advice, education, support and counseling about physical activity/exercise needs.;Develop an individualized exercise prescription for aerobic and resistive training based on initial evaluation findings, risk stratification, comorbidities and participant's personal goals.       Expected Outcomes Short Term: Increase workloads from initial exercise prescription for resistance, speed, and METs.;Short Term: Perform resistance training exercises routinely during rehab and add in resistance training at home;Long Term: Improve cardiorespiratory fitness, muscular endurance and strength as measured by increased METs and functional capacity ( )       Able to understand and use rate of perceived exertion (RPE) scale Yes       Intervention Provide education and explanation on how to use RPE scale       Expected Outcomes Short Term: Able to use RPE daily in rehab to express subjective intensity level;Long Term:  Able to use RPE to guide intensity level when exercising independently       Able to understand and use Dyspnea scale Yes       Intervention Provide education and explanation on how to use Dyspnea scale       Expected Outcomes Short Term: Able to use Dyspnea scale daily in rehab to express subjective sense of  shortness of breath during exertion;Long Term: Able to use Dyspnea scale to guide intensity level when exercising independently       Knowledge and understanding of Target Heart Rate Range (THRR) Yes       Intervention Provide education and explanation of THRR including how the numbers were predicted and where they are located for reference       Expected Outcomes Short Term: Able to use daily as guideline for intensity in rehab;Long Term: Able to use THRR to govern intensity when exercising independently;Short Term: Able to state/look up THRR       Able to check pulse independently Yes       Intervention Provide education and demonstration on how to check pulse in carotid and radial arteries.;Review the importance of being able to check your  own pulse for safety during independent exercise       Expected Outcomes Long Term: Able to check pulse independently and accurately;Short Term: Able to explain why pulse checking is important during independent exercise       Understanding of Exercise Prescription Yes       Intervention Provide education, explanation, and written materials on patient's individual exercise prescription       Expected Outcomes Short Term: Able to explain program exercise prescription;Long Term: Able to explain home exercise prescription to exercise independently

## 2024-01-20 DIAGNOSIS — N186 End stage renal disease: Secondary | ICD-10-CM | POA: Diagnosis not present

## 2024-01-20 DIAGNOSIS — D631 Anemia in chronic kidney disease: Secondary | ICD-10-CM | POA: Diagnosis not present

## 2024-01-20 DIAGNOSIS — D509 Iron deficiency anemia, unspecified: Secondary | ICD-10-CM | POA: Diagnosis not present

## 2024-01-20 DIAGNOSIS — N2581 Secondary hyperparathyroidism of renal origin: Secondary | ICD-10-CM | POA: Diagnosis not present

## 2024-01-20 DIAGNOSIS — Z992 Dependence on renal dialysis: Secondary | ICD-10-CM | POA: Diagnosis not present

## 2024-01-21 DIAGNOSIS — J449 Chronic obstructive pulmonary disease, unspecified: Secondary | ICD-10-CM

## 2024-01-21 NOTE — Progress Notes (Signed)
 Pulmonary Individual Treatment Plan  Patient Details  Name: Jeremy [name] MRN: [id number] Date of Birth: May 09, 1941 Referring Provider:   Flowsheet Row Pulmonary Rehab from 01/19/2024 in [facility] Cardiac and Pulmonary Rehab  Referring Provider Dr. Virl Diamond, MD       Initial Encounter Date:  Flowsheet Row Pulmonary Rehab from 01/19/2024 in [facility] Cardiac and Pulmonary Rehab  Date 01/19/24       Visit Diagnosis: Chronic obstructive pulmonary disease, unspecified COPD type (HCC)  Patient's Home Medications on Admission:  Current Outpatient Medications:    AMBULATORY NON FORMULARY MEDICATION, Please provide portable O2 concentrator for patient. Flow 2L/min, 24/7 use.  J96.10-Chronic respiratory failure., Disp: 1 Device, Rfl: 0   AMBULATORY NON FORMULARY MEDICATION, Please provide portable oxygen concentrator.  Flow 3L/min.  Dx: Chronic respiratory failure J96.11, COPD J42, Disp: 1 Device, Rfl: 0   arformoterol (BROVANA) 15 MCG/2ML NEBU, Take 2 mLs (15 mcg total) by nebulization 2 (two) times daily., Disp: 120 mL, Rfl: 3   budesonide (PULMICORT) 0.5 MG/2ML nebulizer solution, Take 2 mLs (0.5 mg total) by nebulization 2 (two) times daily., Disp: 120 mL, Rfl: 3   Cholecalciferol (VITAMIN D) 50 MCG (2000 UT) CAPS, Take 2,000 Units by mouth daily., Disp: , Rfl:    docusate (COLACE) 50 MG/5ML liquid, Take 10 mLs (100 mg total) by mouth 2 (two) times daily. (Patient not taking: Reported on 01/14/2024), Disp: 100 mL, Rfl: 0   Ensifentrine (OHTUVAYRE) 3 MG/2.5ML SUSP, Inhale 2.5 mLs into the lungs 2 (two) times daily., Disp: , Rfl:    fluticasone (FLONASE) 50 MCG/ACT nasal spray, Place 1 spray into both nostrils daily as needed for allergies or rhinitis., Disp: , Rfl:    guaiFENesin (ROBITUSSIN) 100 MG/5ML liquid, Take 20 mLs by mouth 2 (two) times daily. (Patient taking differently: Take 20 mLs by mouth 2 (two) times daily. Dm Max), Disp: 120 mL, Rfl: 0   ipratropium (ATROVENT) 0.03 % nasal spray,  Place 2 sprays into both nostrils 2 (two) times daily., Disp: 30 mL, Rfl: 2   metoprolol tartrate (LOPRESSOR) 25 MG tablet, Take 1 tablet (25 mg total) by mouth 2 (two) times daily., Disp: 180 tablet, Rfl: 3   Multiple Vitamin (MULTIVITAMIN) LIQD, Take 5 mLs by mouth daily., Disp: , Rfl:    OXYGEN, Inhale 3 L into the lungs continuous., Disp: , Rfl:    polyethylene glycol (MIRALAX / GLYCOLAX) 17 g packet, Take 17 g by mouth daily. (Patient not taking: Reported on 01/14/2024), Disp: 14 each, Rfl: 0   revefenacin (YUPELRI) 175 MCG/3ML nebulizer solution, Take 3 mLs (175 mcg total) by nebulization daily., Disp: 90 mL, Rfl: 3   sevelamer carbonate (RENVELA) 0.8 g PACK packet, Take 0.8 g by mouth daily as needed (snacks)., Disp: , Rfl:    sevelamer carbonate (RENVELA) 2.4 g PACK, Take 2.4 g by mouth 3 (three) times daily with meals., Disp: , Rfl:   Past Medical History: Past Medical History:  Diagnosis Date   Anemia    Cancer (HCC)    patient and family have not been told that he has cancer.   Chronic kidney disease    dialysis T/Th/Sa   COPD (chronic obstructive pulmonary disease) (HCC)    Enlarged prostate    History of blood transfusion    Hyperlipidemia    Hypertension    Self-catheterizes urinary bladder    pt. does this morning and evening--been doing this since 11/2016   Sleep apnea    not using a cpap machine  Tobacco Use: Social History   Tobacco Use  Smoking Status Former   Current packs/day: 0.00   Average packs/day: 0.2 packs/day for 61.5 years (12.3 ttl pk-yrs)   Types: Cigarettes   Start date: 12/29/1961   Quit date: 07/2023   Years since quitting: 0.5   Passive exposure: Never  Smokeless Tobacco Never  Tobacco Comments   Smokes 1/2 pack a day    Pt states he quit smoking 2 months ago. AB, CMA 10-07-23    Labs: Review Flowsheet  More data exists      Latest Ref Rng & Units 03/17/2023 08/10/2023 08/14/2023 08/15/2023 10/31/2023  Labs for ITP Cardiac and Pulmonary  Rehab  Hemoglobin A1c 4.8 - 5.6 % - 5.5  - - -  PH, Arterial 7.35 - 7.45 - 7.221  7.36  7.31  -  PCO2 arterial 32 - 48 mmHg - 76.3  51  61  -  Bicarbonate 20.0 - 28.0 mmol/L - 24.0  31.3  32.6  28.7  30.7  -  TCO2 22 - 32 mmol/L 29  34  35  - - 32   Acid-base deficit 0.0 - 2.0 mmol/L - 0.2  - - -  O2 Saturation % - 92.6  99  61  93.4  94.7  -    Details       Multiple values from one day are sorted in reverse-chronological order          Pulmonary Assessment Scores:   UCSD: Self-administered rating of dyspnea associated with activities of daily living (ADLs) 6-point scale (0 = "not at all" to 5 = "maximal or unable to do because of breathlessness")  Scoring Scores range from 0 to 120.  Minimally important difference is 5 units  CAT: CAT can identify the health impairment of COPD patients and is better correlated with disease progression.  CAT has a scoring range of zero to 40. The CAT score is classified into four groups of low (less than 10), medium (10 - 20), high (21-30) and very high (31-40) based on the impact level of disease on health status. A CAT score over 10 suggests significant symptoms.  A worsening CAT score could be explained by an exacerbation, poor medication adherence, poor inhaler technique, or progression of COPD or comorbid conditions.  CAT MCID is 2 points  mMRC: mMRC (Modified Medical Research Council) Dyspnea Scale is used to assess the degree of baseline functional disability in patients of respiratory disease due to dyspnea. No minimal important difference is established. A decrease in score of 1 point or greater is considered a positive change.   Pulmonary Function Assessment:  Pulmonary Function Assessment - 01/19/24 1607       Pulmonary Function Tests   FVC% 77 %    FEV1% 64 %    FEV1/FVC Ratio 59   Date of PFT: 01/07/2024            Exercise Target Goals: Exercise Program Goal: Individual exercise prescription set using results from  initial 6 min walk test and THRR while considering  patient's activity barriers and safety.   Exercise Prescription Goal: Initial exercise prescription builds to 30-45 minutes a day of aerobic activity, 2-3 days per week.  Home exercise guidelines will be given to patient during program as part of exercise prescription that the participant will acknowledge.  Education: Aerobic Exercise: - Group verbal and visual presentation on the components of exercise prescription. Introduces F.I.T.T principle from ACSM for exercise prescriptions.  Reviews F.I.T.T. principles  of aerobic exercise including progression. Written material given at graduation.   Education: Resistance Exercise: - Group verbal and visual presentation on the components of exercise prescription. Introduces F.I.T.T principle from ACSM for exercise prescriptions  Reviews F.I.T.T. principles of resistance exercise including progression. Written material given at graduation.    Education: Exercise & Equipment Safety: - Individual verbal instruction and demonstration of equipment use and safety with use of the equipment. Flowsheet Row Pulmonary Rehab from 01/19/2024 in [facility] Cardiac and Pulmonary Rehab  Date 01/19/24  Educator NT  Instruction Review Code 1- Verbalizes Understanding       Education: Exercise Physiology & General Exercise Guidelines: - Group verbal and written instruction with models to review the exercise physiology of the cardiovascular system and associated critical values. Provides general exercise guidelines with specific guidelines to those with heart or lung disease.    Education: Flexibility, Balance, Mind/Body Relaxation: - Group verbal and visual presentation with interactive activity on the components of exercise prescription. Introduces F.I.T.T principle from ACSM for exercise prescriptions. Reviews F.I.T.T. principles of flexibility and balance exercise training including progression. Also discusses the  mind body connection.  Reviews various relaxation techniques to help reduce and manage stress (i.e. Deep breathing, progressive muscle relaxation, and visualization). Balance handout provided to take home. Written material given at graduation.   Activity Barriers & Risk Stratification:  Activity Barriers & Cardiac Risk Stratification - 01/19/24 1556       Activity Barriers & Cardiac Risk Stratification   Activity Barriers Balance Concerns;Deconditioning;Shortness of Breath;Back Problems             6 Minute Walk:  6 Minute Walk     Row Name 01/19/24 1552         6 Minute Walk   Phase Initial     Distance 670 feet     Walk Time 4.33 minutes     # of Rest Breaks 1     MPH 1.76     METS 1.3     RPE 11     Perceived Dyspnea  0     VO2 Peak 4.55     Symptoms Yes (comment)     Comments fatigue     Resting HR 74 bpm     Resting BP 148/66     Resting Oxygen Saturation  96 %     Exercise Oxygen Saturation  during 6 min walk 87 %     Max Ex. HR 85 bpm     Max Ex. BP 150/64     2 Minute Post BP 132/66       Interval HR   1 Minute HR 79     2 Minute HR 85     3 Minute HR 84     4 Minute HR 80     5 Minute HR 80     6 Minute HR 83     2 Minute Post HR 69     Interval Heart Rate? Yes       Interval Oxygen   Interval Oxygen? Yes     Baseline Oxygen Saturation % 96 %     1 Minute Oxygen Saturation % 90 %     1 Minute Liters of Oxygen 3 L     2 Minute Oxygen Saturation % 91 %     2 Minute Liters of Oxygen 3 L     3 Minute Oxygen Saturation % 88 %     3 Minute Liters of Oxygen 3 L  4 Minute Oxygen Saturation % 88 %     4 Minute Liters of Oxygen 3 L     5 Minute Oxygen Saturation % 88 %     5 Minute Liters of Oxygen 3 L     6 Minute Oxygen Saturation % 87 %     6 Minute Liters of Oxygen 3 L     2 Minute Post Oxygen Saturation % 93 %     2 Minute Post Liters of Oxygen 3 L             Oxygen Initial Assessment:  Oxygen Initial Assessment - 01/14/24 1135        Home Oxygen   Home Oxygen Device Home Concentrator    Sleep Oxygen Prescription Continuous    Liters per minute 3    Home Exercise Oxygen Prescription Continuous    Liters per minute 3    Home Resting Oxygen Prescription Continuous    Liters per minute 3    Compliance with Home Oxygen Use Yes      Intervention   Short Term Goals To learn and understand importance of monitoring SPO2 with pulse oximeter and demonstrate accurate use of the pulse oximeter.;To learn and understand importance of maintaining oxygen saturations>88%;To learn and demonstrate proper pursed lip breathing techniques or other breathing techniques. ;To learn and demonstrate proper use of respiratory medications    Long  Term Goals Verbalizes importance of monitoring SPO2 with pulse oximeter and return demonstration;Maintenance of O2 saturations>88%;Exhibits proper breathing techniques, such as pursed lip breathing or other method taught during program session;Compliance with respiratory medication;Demonstrates proper use of MDI's             Oxygen Re-Evaluation:   Oxygen Discharge (Final Oxygen Re-Evaluation):   Initial Exercise Prescription:  Initial Exercise Prescription - 01/19/24 1600       Date of Initial Exercise RX and Referring Provider   Date 01/19/24    Referring Provider Dr. Virl Diamond, MD      Oxygen   Oxygen Continuous    Liters 3    Maintain Oxygen Saturation 88% or higher      Recumbant Bike   Level 1    RPM 50    Watts 15    Minutes 15    METs 1.3      NuStep   Level 1    SPM 80    Minutes 15    METs 1.3      Track   Laps 15    Minutes 15    METs 1.82      Prescription Details   Frequency (times per week) 3    Duration Progress to 30 minutes of continuous aerobic without signs/symptoms of physical distress      Intensity   THRR 40-80% of Max Heartrate 99-124    Ratings of Perceived Exertion 11-13    Perceived Dyspnea 0-4      Progression   Progression  Continue to progress workloads to maintain intensity without signs/symptoms of physical distress.      Resistance Training   Training Prescription Yes    Weight 3 lb    Reps 10-15             Perform Capillary Blood Glucose checks as needed.  Exercise Prescription Changes:   Exercise Prescription Changes     Row Name 01/19/24 1600             Response to Exercise   Blood Pressure (Admit) 148/66  Blood Pressure (Exercise) 150/64       Blood Pressure (Exit) 132/66       Heart Rate (Admit) 74 bpm       Heart Rate (Exercise) 85 bpm       Heart Rate (Exit) 69 bpm       Oxygen Saturation (Admit) 96 %       Oxygen Saturation (Exercise) 87 %       Oxygen Saturation (Exit) 93 %       Rating of Perceived Exertion (Exercise) 11       Perceived Dyspnea (Exercise) 0       Symptoms fatigue       Comments Results                Exercise Comments:   Exercise Goals and Review:   Exercise Goals     Row Name 01/19/24 1556             Exercise Goals   Increase Strength and Stamina Yes       Intervention Provide advice, education, support and counseling about physical activity/exercise needs.;Develop an individualized exercise prescription for aerobic and resistive training based on initial evaluation findings, risk stratification, comorbidities and participant's personal goals.       Expected Outcomes Short Term: Increase workloads from initial exercise prescription for resistance, speed, and METs.;Short Term: Perform resistance training exercises routinely during rehab and add in resistance training at home;Long Term: Improve cardiorespiratory fitness, muscular endurance and strength as measured by increased METs and functional capacity ( )       Able to understand and use rate of perceived exertion (RPE) scale Yes       Intervention Provide education and explanation on how to use RPE scale       Expected Outcomes Short Term: Able to use RPE daily in rehab  to express subjective intensity level;Long Term:  Able to use RPE to guide intensity level when exercising independently       Able to understand and use Dyspnea scale Yes       Intervention Provide education and explanation on how to use Dyspnea scale       Expected Outcomes Short Term: Able to use Dyspnea scale daily in rehab to express subjective sense of shortness of breath during exertion;Long Term: Able to use Dyspnea scale to guide intensity level when exercising independently       Knowledge and understanding of Target Heart Rate Range (THRR) Yes       Intervention Provide education and explanation of THRR including how the numbers were predicted and where they are located for reference       Expected Outcomes Short Term: Able to use daily as guideline for intensity in rehab;Long Term: Able to use THRR to govern intensity when exercising independently;Short Term: Able to state/look up THRR       Able to check pulse independently Yes       Intervention Provide education and demonstration on how to check pulse in carotid and radial arteries.;Review the importance of being able to check your own pulse for safety during independent exercise       Expected Outcomes Long Term: Able to check pulse independently and accurately;Short Term: Able to explain why pulse checking is important during independent exercise       Understanding of Exercise Prescription Yes       Intervention Provide education, explanation, and written materials on patient's individual exercise prescription  Expected Outcomes Short Term: Able to explain program exercise prescription;Long Term: Able to explain home exercise prescription to exercise independently                Exercise Goals Re-Evaluation :   Discharge Exercise Prescription (Final Exercise Prescription Changes):  Exercise Prescription Changes - 01/19/24 1600       Response to Exercise   Blood Pressure (Admit) 148/66    Blood Pressure (Exercise)  150/64    Blood Pressure (Exit) 132/66    Heart Rate (Admit) 74 bpm    Heart Rate (Exercise) 85 bpm    Heart Rate (Exit) 69 bpm    Oxygen Saturation (Admit) 96 %    Oxygen Saturation (Exercise) 87 %    Oxygen Saturation (Exit) 93 %    Rating of Perceived Exertion (Exercise) 11    Perceived Dyspnea (Exercise) 0    Symptoms fatigue    Comments Results             Nutrition:  Target Goals: Understanding of nutrition guidelines, daily intake of sodium 1500mg , cholesterol 200mg , calories 30% from fat and 7% or less from saturated fats, daily to have 5 or more servings of fruits and vegetables.  Education: All About Nutrition: -Group instruction provided by verbal, written material, interactive activities, discussions, models, and posters to present general guidelines for heart healthy nutrition including fat, fiber, MyPlate, the role of sodium in heart healthy nutrition, utilization of the nutrition label, and utilization of this knowledge for meal planning. Follow up email sent as well. Written material given at graduation.   Biometrics:  Pre Biometrics - 01/19/24 1556       Pre Biometrics   Height 5' 8.5" (1.74 m)    Weight 160 lb 6.4 oz (72.8 kg)    BMI (Calculated) 24.03    Single Leg Stand 0 seconds              Nutrition Therapy Plan and Nutrition Goals:  Nutrition Therapy & Goals - 01/19/24 1543       Nutrition Therapy   RD appointment deferred Yes      Intervention Plan   Intervention Prescribe, educate and counsel regarding individualized specific dietary modifications aiming towards targeted core components such as weight, hypertension, lipid management, diabetes, heart failure and other comorbidities.    Expected Outcomes Short Term Goal: A plan has been developed with personal nutrition goals set during dietitian appointment.;Short Term Goal: Understand basic principles of dietary content, such as calories, fat, sodium, cholesterol and nutrients.              Nutrition Assessments:  MEDIFICTS Score Key: >=70 Need to make dietary changes  40-70 Heart Healthy Diet <= 40 Therapeutic Level Cholesterol Diet   Picture Your Plate Scores: <16 Unhealthy dietary pattern with much room for improvement. 41-50 Dietary pattern unlikely to meet recommendations for good health and room for improvement. 51-60 More healthful dietary pattern, with some room for improvement.  >60 Healthy dietary pattern, although there may be some specific behaviors that could be improved.   Nutrition Goals Re-Evaluation:   Nutrition Goals Discharge (Final Nutrition Goals Re-Evaluation):   Psychosocial: Target Goals: Acknowledge presence or absence of significant depression and/or stress, maximize coping skills, provide positive support system. Participant is able to verbalize types and ability to use techniques and skills needed for reducing stress and depression.   Education: Stress, Anxiety, and Depression - Group verbal and visual presentation to define topics covered.  Reviews how body is  impacted by stress, anxiety, and depression.  Also discusses healthy ways to reduce stress and to treat/manage anxiety and depression.  Written material given at graduation.   Education: Sleep Hygiene -Provides group verbal and written instruction about how sleep can affect your health.  Define sleep hygiene, discuss sleep cycles and impact of sleep habits. Review good sleep hygiene tips.    Initial Review & Psychosocial Screening:  Initial Psych Review & Screening - 01/14/24 1137       Initial Review   Current issues with Current Stress Concerns    Source of Stress Concerns Unable to participate in former interests or hobbies;Unable to perform yard/household activities;Chronic Illness      Family Dynamics   Good Support System? Yes   wife, family     Barriers   Psychosocial barriers to participate in program There are no identifiable barriers or  psychosocial needs.;The patient should benefit from training in stress management and relaxation.      Screening Interventions   Interventions Encouraged to exercise;Provide feedback about the scores to participant;To provide support and resources with identified psychosocial needs    Expected Outcomes Short Term goal: Utilizing psychosocial counselor, staff and physician to assist with identification of specific Stressors or current issues interfering with healing process. Setting desired goal for each stressor or current issue identified.;Long Term Goal: Stressors or current issues are controlled or eliminated.;Short Term goal: Identification and review with participant of any Quality of Life or Depression concerns found by scoring the questionnaire.;Long Term goal: The participant improves quality of Life and PHQ9 Scores as seen by post scores and/or verbalization of changes             Quality of Life Scores:  Scores of 19 and below usually indicate a poorer quality of life in these areas.  A difference of  2-3 points is a clinically meaningful difference.  A difference of 2-3 points in the total score of the Quality of Life Index has been associated with significant improvement in overall quality of life, self-image, physical symptoms, and general health in studies assessing change in quality of life.  PHQ-9: Review Flowsheet  More data exists      01/19/2024 05/19/2023 04/23/2022 01/08/2022 08/10/2020  Depression screen PHQ 2/9  Decreased Interest 1 0 0 2 0  Down, Depressed, Hopeless 1 0 0 2 0  PHQ - 2 Score 2 0 0 4 0  Altered sleeping 1 - - - 0  Tired, decreased energy 1 - - - 0  Change in appetite 0 - - - 0  Feeling bad or failure about yourself  0 - - - 0  Trouble concentrating 1 - - - 0  Moving slowly or fidgety/restless 0 - - - 0  Suicidal thoughts 0 - - - 0  PHQ-9 Score 5 - - - 0  Difficult doing work/chores Not difficult at all - - - -   Interpretation of Total Score  Total  Score Depression Severity:  1-4 = Minimal depression, 5-9 = Mild depression, 10-14 = Moderate depression, 15-19 = Moderately severe depression, 20-27 = Severe depression   Psychosocial Evaluation and Intervention:  Psychosocial Evaluation - 01/14/24 1152       Psychosocial Evaluation & Interventions   Interventions Encouraged to exercise with the program and follow exercise prescription;Stress management education;Relaxation education    Comments Mr. Rudd is coming to pulmonary rehab with COPD. His wife notes that his stamina, breathing, and strength have gotten worse in the last year. They  do have an aide that comes to help them around the house and transportation. He states he doesn't feel short of breath a lot, but knows that he is a mouth breather. He wears his oxygen as he should, but they have been having issues getting the correct canister sizes and it has been stressful trying to figure it out. He wants to get back to golfing and enjoys being outside. When asked about stress, he mentions he is getting old and not able to do what he once was, but that this program sounds like it will be helpful for him.    Expected Outcomes Short: attend pulmonary rehab for education and exercise. Long: develop and maintain positive self care habits    Continue Psychosocial Services  Follow up required by staff             Psychosocial Re-Evaluation:   Psychosocial Discharge (Final Psychosocial Re-Evaluation):   Education: Education Goals: Education classes will be provided on a weekly basis, covering required topics. Participant will state understanding/return demonstration of topics presented.  Learning Barriers/Preferences:  Learning Barriers/Preferences - 01/14/24 1139       Learning Barriers/Preferences   Learning Barriers None    Learning Preferences Individual Instruction             General Pulmonary Education Topics:  Infection Prevention: - Provides verbal and written  material to individual with discussion of infection control including proper hand washing and proper equipment cleaning during exercise session. Flowsheet Row Pulmonary Rehab from 01/19/2024 in [facility] Cardiac and Pulmonary Rehab  Date 01/19/24  Educator NT  Instruction Review Code 1- Verbalizes Understanding       Falls Prevention: - Provides verbal and written material to individual with discussion of falls prevention and safety. Flowsheet Row Pulmonary Rehab from 01/19/2024 in [facility] Cardiac and Pulmonary Rehab  Date 01/19/24  Educator NT  Instruction Review Code 1- Verbalizes Understanding       Chronic Lung Disease Review: - Group verbal instruction with posters, models, PowerPoint presentations and videos,  to review new updates, new respiratory medications, new advancements in procedures and treatments. Providing information on websites and "800" numbers for continued self-education. Includes information about supplement oxygen, available portable oxygen systems, continuous and intermittent flow rates, oxygen safety, concentrators, and Medicare reimbursement for oxygen. Explanation of Pulmonary Drugs, including class, frequency, complications, importance of spacers, rinsing mouth after steroid MDI's, and proper cleaning methods for nebulizers. Review of basic lung anatomy and physiology related to function, structure, and complications of lung disease. Review of risk factors. Discussion about methods for diagnosing sleep apnea and types of masks and machines for OSA. Includes a review of the use of types of environmental controls: home humidity, furnaces, filters, dust mite/pet prevention, HEPA vacuums. Discussion about weather changes, air quality and the benefits of nasal washing. Instruction on Warning signs, infection symptoms, calling MD promptly, preventive modes, and value of vaccinations. Review of effective airway clearance, coughing and/or vibration techniques. Emphasizing that all  should Create an Action Plan. Written material given at graduation.   AED/CPR: - Group verbal and written instruction with the use of models to demonstrate the basic use of the AED with the basic ABC's of resuscitation.    Anatomy and Cardiac Procedures: - Group verbal and visual presentation and models provide information about basic cardiac anatomy and function. Reviews the testing methods done to diagnose heart disease and the outcomes of the test results. Describes the treatment choices: Medical Management, Angioplasty, or Coronary Bypass Surgery for  treating various heart conditions including Myocardial Infarction, Angina, Valve Disease, and Cardiac Arrhythmias.  Written material given at graduation.   Medication Safety: - Group verbal and visual instruction to review commonly prescribed medications for heart and lung disease. Reviews the medication, class of the drug, and side effects. Includes the steps to properly store meds and maintain the prescription regimen.  Written material given at graduation.   Other: -Provides group and verbal instruction on various topics (see comments)   Knowledge Questionnaire Score:    Core Components/Risk Factors/Patient Goals at Admission:  Personal Goals and Risk Factors at Admission - 01/14/24 1135       Core Components/Risk Factors/Patient Goals on Admission   Improve shortness of breath with ADL's Yes    Intervention Provide education, individualized exercise plan and daily activity instruction to help decrease symptoms of SOB with activities of daily living.    Expected Outcomes Short Term: Improve cardiorespiratory fitness to achieve a reduction of symptoms when performing ADLs;Long Term: Be able to perform more ADLs without symptoms or delay the onset of symptoms    Hypertension Yes    Intervention Provide education on lifestyle modifcations including regular physical activity/exercise, weight management, moderate sodium restriction and  increased consumption of fresh fruit, vegetables, and low fat dairy, alcohol moderation, and smoking cessation.;Monitor prescription use compliance.    Expected Outcomes Short Term: Continued assessment and intervention until BP is < 140/24mm HG in hypertensive participants. < 130/15mm HG in hypertensive participants with diabetes, heart failure or chronic kidney disease.;Long Term: Maintenance of blood pressure at goal levels.    Lipids Yes    Intervention Provide education and support for participant on nutrition & aerobic/resistive exercise along with prescribed medications to achieve LDL 70mg , HDL >40mg .    Expected Outcomes Short Term: Participant states understanding of desired cholesterol values and is compliant with medications prescribed. Participant is following exercise prescription and nutrition guidelines.;Long Term: Cholesterol controlled with medications as prescribed, with individualized exercise RX and with personalized nutrition plan. Value goals: LDL < 70mg , HDL > 40 mg.             Education:Diabetes - Individual verbal and written instruction to review signs/symptoms of diabetes, desired ranges of glucose level fasting, after meals and with exercise. Acknowledge that pre and post exercise glucose checks will be done for 3 sessions at entry of program.   Know Your Numbers and Heart Failure: - Group verbal and visual instruction to discuss disease risk factors for cardiac and pulmonary disease and treatment options.  Reviews associated critical values for Overweight/Obesity, Hypertension, Cholesterol, and Diabetes.  Discusses basics of heart failure: signs/symptoms and treatments.  Introduces Heart Failure Zone chart for action plan for heart failure.  Written material given at graduation.   Core Components/Risk Factors/Patient Goals Review:    Core Components/Risk Factors/Patient Goals at Discharge (Final Review):    ITP Comments:  ITP Comments     Row Name 01/14/24  1147 01/19/24 1528 01/21/24 0812       ITP Comments Initial phone call completed. Diagnosis can be found in [facility] 2/26. EP Orientation scheduled for Monday 3/10 at 10:30. Completed and gym orientation. Initial ITP created and sent for review to Dr. Jinny Sanders, Medical Director. 30 Day review completed. Medical Director ITP review done, changes made as directed, and signed approval by Medical Director. New to program              Comments: 30 Day review completed. Medical Director ITP review done, changes  made as directed, and signed approval by Medical Director. New to program.

## 2024-01-22 DIAGNOSIS — Z992 Dependence on renal dialysis: Secondary | ICD-10-CM | POA: Diagnosis not present

## 2024-01-22 DIAGNOSIS — N186 End stage renal disease: Secondary | ICD-10-CM | POA: Diagnosis not present

## 2024-01-22 DIAGNOSIS — D509 Iron deficiency anemia, unspecified: Secondary | ICD-10-CM | POA: Diagnosis not present

## 2024-01-22 DIAGNOSIS — D631 Anemia in chronic kidney disease: Secondary | ICD-10-CM | POA: Diagnosis not present

## 2024-01-22 DIAGNOSIS — N2581 Secondary hyperparathyroidism of renal origin: Secondary | ICD-10-CM | POA: Diagnosis not present

## 2024-01-23 ENCOUNTER — Telehealth: Payer: Self-pay | Admitting: Pharmacist

## 2024-01-23 ENCOUNTER — Encounter: Payer: Self-pay | Admitting: Emergency Medicine

## 2024-01-23 ENCOUNTER — Ambulatory Visit
Admission: EM | Admit: 2024-01-23 | Discharge: 2024-01-23 | Disposition: A | Discharge status: Home / Self Care | Attending: Emergency Medicine | Admitting: Emergency Medicine

## 2024-01-23 DIAGNOSIS — H938X3 Other specified disorders of ear, bilateral: Secondary | ICD-10-CM | POA: Diagnosis not present

## 2024-01-23 MED ORDER — CARBAMIDE PEROXIDE 6.5 % OT SOLN: 5.0000 [drp] | Freq: Two times a day (BID) | OTIC | 0 refills | Status: DC

## 2024-01-23 NOTE — Discharge Instructions (Addendum)
 Today in clinic we cleaned out his ears.  He can use the Debrox drops twice daily to help soften any wax.  Ensure hearing aids are cleaned routinely to help prevent any wax or bacteria buildup.  Return to clinic for any new or urgent symptoms.

## 2024-01-23 NOTE — ED Triage Notes (Signed)
 Pt c/o bilateral ear clogged

## 2024-01-23 NOTE — Telephone Encounter (Signed)
 Received [employer] new start paperwork. Completed form and faxed with clinicals and insurance card copy to [facility]   Phone#: [phone] Fax#: [phone]

## 2024-01-23 NOTE — ED Provider Notes (Signed)
 [name]    CSN: [id number] Arrival date & time: 01/23/24  1331      History   Chief Complaint Chief Complaint  Patient presents with   Ear Fullness    HPI Jeremy [name] is a 83 y.o. male.   Patient presents to clinic over concerns of diminished hearing.  He would like to start using his hearing aids again but is concerned over built up the earwax and needs it manually removed.  He is not having any pain or drainage from the ears.  Has had his ears cleaned out before, last time was about a year ago and he tolerated this well.  He is on 3 L of continuous oxygen.  The history is provided by the patient and medical records.  Ear Fullness    Past Medical History:  Diagnosis Date   Anemia    Cancer [facility])    patient and family have not been told that he has cancer.   Chronic kidney disease    dialysis T/Th/Sa   COPD (chronic obstructive pulmonary disease) (HCC)    Enlarged prostate    History of blood transfusion    Hyperlipidemia    Hypertension    Self-catheterizes urinary bladder    pt. does this morning and evening--been doing this since 11/2016   Sleep apnea    not using a cpap machine    Patient Active Problem List   Diagnosis Date Noted   Malnutrition of moderate degree 08/13/2023   Hypoxia 08/12/2023   Chronic respiratory failure with hypoxia (HCC) 08/12/2023   Other disorders of phosphorus metabolism 06/25/2023   Malignant neoplasm of bladder, unspecified (HCC) 01/06/2023   ESRD (end stage renal disease) (HCC) 10/24/2022   Gait instability 10/24/2022   Neck pain on right side 04/23/2022   Intermediate stage nonexudative age-related macular degeneration of left eye 08/17/2020   Pseudophakia 08/17/2020   Degenerative retinal drusen of left eye 08/17/2020   Advanced nonexudative age-related macular degeneration of right eye with subfoveal involvement 08/17/2020   Senile purpura (HCC) 08/10/2020   Aortic atherosclerosis (HCC) 08/10/2020    Cerumen impaction 08/10/2020   Skin lesion 07/06/2019   Hyperlipidemia 01/05/2019   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   Cigarette smoker 03/17/2018   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   OSA (obstructive sleep apnea) 05/23/2016   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   Osteoarthritis 01/29/2010   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   History of bladder cancer 10/10/2008   Chronic bronchitis (HCC) 10/10/2008   Diverticulosis of large intestine 10/10/2008   COLONIC POLYPS 12/07/2007   Essential hypertension 12/07/2007   Hemorrhoids 12/07/2007   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    Past Surgical History:  Procedure Laterality Date   A/V FISTULAGRAM Left 03/12/2023   Procedure: A/V Fistulagram;  Surgeon: Victorino Sparrow, MD;  Location: [facility] INVASIVE CV LAB;  Service: Cardiovascular;  Laterality: Left;   A/V FISTULAGRAM Left 10/31/2023   Procedure: A/V Fistulagram;  Surgeon: Ethelene Hal, MD;  Location: [facility] INVASIVE CV LAB;  Service: Cardiovascular;  Laterality: Left;   AV FISTULA PLACEMENT Left 10/16/2022   Procedure: LEFT BRACHIOCEPHALIC ARTERIOVENOUS (AV) FISTULA CREATION;  Surgeon: Cephus Shelling, MD;  Location: [facility] OR;  Service: Vascular;  Laterality: Left;   BACK SURGERY     BLADDER TUMOR EXCISION  2015   benign   BLEPHAROPLASTY  12/2018   LIGATION OF COMPETING BRANCHES OF ARTERIOVENOUS FISTULA Left 03/17/2023  Procedure: LEFT ARM FISTULA BRANCH LIGATION;  Surgeon: Victorino Sparrow, MD;  Location: [facility] OR;  Service: Vascular;  Laterality: Left;   LUMBAR LAMINECTOMY/DECOMPRESSION MICRODISCECTOMY Bilateral 08/25/2017   Procedure: Bilateral Lumbar Three- Four, Lumbar Four- Five, Lumbar Five- Sacral One Laminectomy;  Surgeon: Barnett Abu, MD;  Location: [facility] OR;  Service: Neurosurgery;  Laterality: Bilateral;  Bilateral L3-4 L4-5 L5-S1 Laminectomy   SKIN CANCER EXCISION     Surg x2...    TONSILLECTOMY AND ADENOIDECTOMY     Surg as a child...   TRANSURETHRAL RESECTION OF BLADDER TUMOR N/A 01/03/2022   Procedure: TRANSURETHRAL RESECTION OF BLADDER TUMOR (TURBT)/ CYSTOSCOPY/ EXAM UNDER ANESTHESIA, BILATERAL RETROGRADE/ BLADDER BIOPSIES;  Surgeon: Heloise Purpura, MD;  Location: [facility] ORS;  Service: Urology;  Laterality: N/A;  GENERAL ANESTHESIA WITH PARALYSIS       Home Medications    Prior to Admission medications   Medication Sig Start Date End Date Taking? Authorizing Provider  carbamide peroxide (DEBROX) 6.5 % OTIC solution Place 5 drops into both ears 2 (two) times daily. 01/23/24  Yes Rinaldo Ratel, [place] N, FNP  AMBULATORY NON FORMULARY MEDICATION Please provide portable O2 concentrator for patient. Flow 2L/min, 24/7 use.  J96.10-Chronic respiratory failure. 09/12/23   Everrett Coombe, DO  AMBULATORY NON FORMULARY MEDICATION Please provide portable oxygen concentrator.  Flow 3L/min.  Dx: Chronic respiratory failure J96.11, COPD J42 09/22/23   Everrett Coombe, DO  arformoterol (BROVANA) 15 MCG/2ML NEBU Take 2 mLs (15 mcg total) by nebulization 2 (two) times daily. 10/08/23   Glenford Bayley, NP  budesonide (PULMICORT) 0.5 MG/2ML nebulizer solution Take 2 mLs (0.5 mg total) by nebulization 2 (two) times daily. 10/08/23   Glenford Bayley, NP  Cholecalciferol (VITAMIN D) 50 MCG (2000 UT) CAPS Take 2,000 Units by mouth daily.    [provider]  docusate (COLACE) 50 MG/5ML liquid Take 10 mLs (100 mg total) by mouth 2 (two) times daily. Patient not taking: Reported on 01/14/2024 08/19/23   Regalado, Jon Billings A, MD  Ensifentrine (OHTUVAYRE) 3 MG/2.5ML SUSP Inhale 2.5 mLs into the lungs 2 (two) times daily. 01/14/24   Cobb, Ruby Cola, NP  fluticasone (FLONASE) 50 MCG/ACT nasal spray Place 1 spray into both nostrils daily as needed for allergies or rhinitis.    [provider]  guaiFENesin (ROBITUSSIN) 100 MG/5ML liquid Take 20 mLs by mouth 2 (two) times daily. Patient  taking differently: Take 20 mLs by mouth 2 (two) times daily. Dm Max 08/19/23   Regalado, Belkys A, MD  ipratropium (ATROVENT) 0.03 % nasal spray Place 2 sprays into both nostrils 2 (two) times daily. 10/07/23   Glenford Bayley, NP  metoprolol tartrate (LOPRESSOR) 25 MG tablet Take 1 tablet (25 mg total) by mouth 2 (two) times daily. 12/01/23   Everrett Coombe, DO  Multiple Vitamin (MULTIVITAMIN) LIQD Take 5 mLs by mouth daily.    [provider]  OXYGEN Inhale 3 L into the lungs continuous.    [provider]  polyethylene glycol (MIRALAX / GLYCOLAX) 17 g packet Take 17 g by mouth daily. Patient not taking: Reported on 01/14/2024 08/19/23   Regalado, Jon Billings A, MD  revefenacin (YUPELRI) 175 MCG/3ML nebulizer solution Take 3 mLs (175 mcg total) by nebulization daily. 10/08/23   Glenford Bayley, NP  sevelamer carbonate (RENVELA) 0.8 g PACK packet Take 0.8 g by mouth daily as needed (snacks). 10/28/23   [provider]  sevelamer carbonate (RENVELA) 2.4 g PACK Take 2.4 g by mouth 3 (three) times daily  with meals.    [provider]    Family History Family History  Problem Relation Age of Onset   Colon cancer Neg Hx    Stomach cancer Neg Hx    Rectal cancer Neg Hx     Social History Social History   Tobacco Use   Smoking status: Former    Current packs/day: 0.00    Average packs/day: 0.2 packs/day for 61.5 years (12.3 ttl pk-yrs)    Types: Cigarettes    Start date: 12/29/1961    Quit date: 07/2023    Years since quitting: 0.5    Passive exposure: Never   Smokeless tobacco: Never   Tobacco comments:    Smokes 1/2 pack a day     Pt states he quit smoking 2 months ago. AB, CMA 10-07-23  Vaping Use   Vaping status: Never Used  Substance Use Topics   Alcohol use: Not Currently    Comment: couple glasses with dinner daily   Drug use: No     Allergies   Sulfa antibiotics, Penicillins, and Tape   Review of Systems Review of Systems  Per  HPI   Physical Exam Triage Vital Signs ED Triage Vitals  Encounter Vitals Group     BP 01/23/24 1335 (!) 152/70     Systolic BP Percentile --      Diastolic BP Percentile --      Pulse Rate 01/23/24 1335 74     Resp 01/23/24 1335 16     Temp 01/23/24 1335 98.3 F (36.8 C)     Temp Source 01/23/24 1335 Oral     SpO2 01/23/24 1335 95 %     Weight --      Height --      Head Circumference --      Peak Flow --      Pain Score 01/23/24 1339 0     Pain Loc --      Pain Education --      Exclude from Growth Chart --    No data found.  Updated Vital Signs BP (!) 152/70 (BP Location: Right Arm)   Pulse 74   Temp 98.3 F (36.8 C) (Oral)   Resp 16   SpO2 95%   Visual Acuity Right Eye Distance:   Left Eye Distance:   Bilateral Distance:    Right Eye Near:   Left Eye Near:    Bilateral Near:     Physical Exam Vitals and nursing note reviewed.  Constitutional:      Appearance: Normal appearance.  HENT:     Head: Normocephalic and atraumatic.     Right Ear: External ear normal.     Left Ear: External ear normal.     Nose: Nose normal.     Mouth/Throat:     Mouth: Mucous membranes are moist.  Eyes:     Conjunctiva/sclera: Conjunctivae normal.  Cardiovascular:     Rate and Rhythm: Normal rate.  Pulmonary:     Effort: Pulmonary effort is normal. No respiratory distress.  Neurological:     General: No focal deficit present.     Mental Status: He is alert.  Psychiatric:        Mood and Affect: Mood normal.      UC Treatments / Results  Labs (all labs ordered are listed, but only abnormal results are displayed) Labs Reviewed - No data to display  EKG   Radiology No results found.  Procedures Procedures (including critical care time)  Medications  Ordered in UC Medications - No data to display  Initial Impression / Assessment and Plan / UC Course  I have reviewed the triage vital signs and the nursing notes.  Pertinent labs & imaging results that  were available during my care of the patient were reviewed by me and considered in my medical decision making (see chart for details).  Vitals and triage reviewed, patient is hemodynamically stable.  Bilateral external auditory canal shows wax buildup without impaction.  Staff to perform irrigation, successfully removed large portions of wax bilaterally.  Advised Debrox drops.  Plan of care, follow-up care and return precautions given, no questions at this time.    Final Clinical Impressions(s) / UC Diagnoses   Final diagnoses:  Ear fullness, bilateral     Discharge Instructions      Today in clinic we cleaned out his ears.  He can use the Debrox drops twice daily to help soften any wax.  Ensure hearing aids are cleaned routinely to help prevent any wax or bacteria buildup.  Return to clinic for any new or urgent symptoms.     ED Prescriptions     Medication Sig Dispense Auth. Provider   carbamide peroxide (DEBROX) 6.5 % OTIC solution Place 5 drops into both ears 2 (two) times daily. 15 mL Lynessa Almanzar, [place] N, FNP      PDMP not reviewed this encounter.   Yue Glasheen, [place] N, [place] 01/23/24 1401

## 2024-01-24 DIAGNOSIS — D509 Iron deficiency anemia, unspecified: Secondary | ICD-10-CM | POA: Diagnosis not present

## 2024-01-24 DIAGNOSIS — Z992 Dependence on renal dialysis: Secondary | ICD-10-CM | POA: Diagnosis not present

## 2024-01-24 DIAGNOSIS — N2581 Secondary hyperparathyroidism of renal origin: Secondary | ICD-10-CM | POA: Diagnosis not present

## 2024-01-24 DIAGNOSIS — D631 Anemia in chronic kidney disease: Secondary | ICD-10-CM | POA: Diagnosis not present

## 2024-01-24 DIAGNOSIS — N186 End stage renal disease: Secondary | ICD-10-CM | POA: Diagnosis not present

## 2024-01-26 ENCOUNTER — Ambulatory Visit (HOSPITAL_COMMUNITY)
Admission: RE | Admit: 2024-01-26 | Discharge: 2024-01-26 | Disposition: A | Discharge status: Home / Self Care | Source: Ambulatory Visit | Attending: Family Medicine | Admitting: Family Medicine

## 2024-01-26 DIAGNOSIS — R918 Other nonspecific abnormal finding of lung field: Secondary | ICD-10-CM | POA: Diagnosis not present

## 2024-01-26 DIAGNOSIS — J9691 Respiratory failure, unspecified with hypoxia: Secondary | ICD-10-CM | POA: Diagnosis not present

## 2024-01-26 DIAGNOSIS — J9611 Chronic respiratory failure with hypoxia: Secondary | ICD-10-CM | POA: Insufficient documentation

## 2024-01-26 DIAGNOSIS — J432 Centrilobular emphysema: Secondary | ICD-10-CM | POA: Diagnosis not present

## 2024-01-26 DIAGNOSIS — J410 Simple chronic bronchitis: Secondary | ICD-10-CM | POA: Insufficient documentation

## 2024-01-27 DIAGNOSIS — N186 End stage renal disease: Secondary | ICD-10-CM | POA: Diagnosis not present

## 2024-01-27 DIAGNOSIS — Z992 Dependence on renal dialysis: Secondary | ICD-10-CM | POA: Diagnosis not present

## 2024-01-27 DIAGNOSIS — D631 Anemia in chronic kidney disease: Secondary | ICD-10-CM | POA: Diagnosis not present

## 2024-01-27 DIAGNOSIS — N2581 Secondary hyperparathyroidism of renal origin: Secondary | ICD-10-CM | POA: Diagnosis not present

## 2024-01-27 DIAGNOSIS — D509 Iron deficiency anemia, unspecified: Secondary | ICD-10-CM | POA: Diagnosis not present

## 2024-01-27 NOTE — Telephone Encounter (Signed)
 Received fax from [employer] with summary of benefits. Referral form for [facility] received. Rx will be triaged to [facility].. Once benefits investigation completed, pharmacy will reach out the patient to schedule shipment. If medication is unaffordable, patient will need to express financial hardship to be referred back to [place] [facility] for patient assistance program pre-screening.  Prior authorization is not required. He has Medicare part B and BCBS Medicare Supplement   Patient ID: [id number] Pharmacy phone: [phone] [facility] Phone#: [phone]

## 2024-01-28 DIAGNOSIS — N401 Enlarged prostate with lower urinary tract symptoms: Secondary | ICD-10-CM | POA: Diagnosis not present

## 2024-01-28 DIAGNOSIS — R3914 Feeling of incomplete bladder emptying: Secondary | ICD-10-CM | POA: Diagnosis not present

## 2024-01-28 DIAGNOSIS — Z8551 Personal history of malignant neoplasm of bladder: Secondary | ICD-10-CM | POA: Diagnosis not present

## 2024-01-28 DIAGNOSIS — R8271 Bacteriuria: Secondary | ICD-10-CM | POA: Diagnosis not present

## 2024-01-29 ENCOUNTER — Other Ambulatory Visit (HOSPITAL_COMMUNITY): Payer: Self-pay

## 2024-01-29 DIAGNOSIS — N2581 Secondary hyperparathyroidism of renal origin: Secondary | ICD-10-CM | POA: Diagnosis not present

## 2024-01-29 DIAGNOSIS — N186 End stage renal disease: Secondary | ICD-10-CM | POA: Diagnosis not present

## 2024-01-29 DIAGNOSIS — Z992 Dependence on renal dialysis: Secondary | ICD-10-CM | POA: Diagnosis not present

## 2024-01-29 DIAGNOSIS — N184 Chronic kidney disease, stage 4 (severe): Secondary | ICD-10-CM

## 2024-01-29 DIAGNOSIS — D509 Iron deficiency anemia, unspecified: Secondary | ICD-10-CM | POA: Diagnosis not present

## 2024-01-29 DIAGNOSIS — D631 Anemia in chronic kidney disease: Secondary | ICD-10-CM | POA: Diagnosis not present

## 2024-01-30 ENCOUNTER — Encounter

## 2024-01-30 ENCOUNTER — Ambulatory Visit (HOSPITAL_COMMUNITY)
Admission: RE | Admit: 2024-01-30 | Discharge: 2024-01-30 | Disposition: A | Discharge status: Home / Self Care | Source: Ambulatory Visit | Attending: Nephrology | Admitting: Nephrology

## 2024-01-30 DIAGNOSIS — N184 Chronic kidney disease, stage 4 (severe): Secondary | ICD-10-CM | POA: Insufficient documentation

## 2024-01-30 LAB — PREPARE RBC (CROSSMATCH)

## 2024-01-30 MED ORDER — SODIUM CHLORIDE 0.9% IV SOLUTION: Freq: Once | INTRAVENOUS | Status: DC

## 2024-01-31 DIAGNOSIS — D509 Iron deficiency anemia, unspecified: Secondary | ICD-10-CM | POA: Diagnosis not present

## 2024-01-31 DIAGNOSIS — D631 Anemia in chronic kidney disease: Secondary | ICD-10-CM | POA: Diagnosis not present

## 2024-01-31 DIAGNOSIS — N2581 Secondary hyperparathyroidism of renal origin: Secondary | ICD-10-CM | POA: Diagnosis not present

## 2024-01-31 DIAGNOSIS — N186 End stage renal disease: Secondary | ICD-10-CM | POA: Diagnosis not present

## 2024-01-31 DIAGNOSIS — Z992 Dependence on renal dialysis: Secondary | ICD-10-CM | POA: Diagnosis not present

## 2024-01-31 LAB — TYPE AND SCREEN
ABO/RH(D): O POS
Antibody Screen: NEGATIVE
Unit division: 0

## 2024-01-31 LAB — BPAM RBC
Blood Product Expiration Date: 202504172359
ISSUE DATE / TIME: 202503211018
Unit Type and Rh: 5100

## 2024-02-02 ENCOUNTER — Encounter: Admitting: *Deleted

## 2024-02-02 DIAGNOSIS — J449 Chronic obstructive pulmonary disease, unspecified: Secondary | ICD-10-CM

## 2024-02-02 NOTE — Progress Notes (Signed)
 Reviewed PLB technique with pt.  Talked about how it works and it's importance in maintaining their exercise saturations.    Short: Become more profiecient at using PLB.   Long: Become independent at using PLB.

## 2024-02-02 NOTE — Progress Notes (Signed)
 Daily Session Note  Patient Details  Name: Jeremy [name] MRN: [id number] Date of Birth: 04/19/1941 Referring Provider:   [facility] from 01/19/2024 in [facility] Cardiac and Pulmonary Rehab  Referring Provider Dr. Virl Diamond, MD       Encounter Date: 02/02/2024  Check In:  Session Check In - 02/02/24 1643       Check-In   Supervising physician immediately available to respond to emergencies See telemetry face sheet for immediately available ER MD    Location [facility]-Cardiac & Pulmonary Rehab    Staff Present Rory Percy, MS, Exercise Physiologist;Levelle Edelen, RN, BSN, CCRP;Maxon Conetta BS, Exercise Physiologist;Kelly [employer], ACSM CEP, Exercise Physiologist;Meredith Jewel Baize RN,BSN    Virtual Visit No    Medication changes reported     No    Fall or balance concerns reported    No    Warm-up and Cool-down Performed on first and last piece of equipment    Resistance Training Performed Yes    VAD Patient? No    PAD/SET Patient? No      Pain Assessment   Currently in Pain? No/denies                Social History   Tobacco Use  Smoking Status Former   Current packs/day: 0.00   Average packs/day: 0.2 packs/day for 61.5 years (12.3 ttl pk-yrs)   Types: Cigarettes   Start date: 12/29/1961   Quit date: 07/2023   Years since quitting: 0.5   Passive exposure: Never  Smokeless Tobacco Never  Tobacco Comments   Smokes 1/2 pack a day    Pt states he quit smoking 2 months ago. AB, CMA 10-07-23    Goals Met:  Proper associated with RPD/PD & O2 Sat Exercise tolerated well Personal goals reviewed No report of concerns or symptoms today  Goals Unmet:  Not Applicable  Comments: Pt able to follow exercise prescription today without complaint.  Will continue to monitor for progression. First full day of exercise!  Patient was oriented to gym and equipment including functions, settings, policies, and procedures.  Patient's individual exercise prescription  and treatment plan were reviewed.  All starting workloads were established based on the results of the 6 minute walk test done at initial orientation visit.  The plan for exercise progression was also introduced and progression will be customized based on patient's performance and goals.    Dr. Bethann Punches is Medical Director for [facility] Cardiac Rehabilitation.  Dr. Vida Rigger is Medical Director for [facility] Pulmonary Rehabilitation.

## 2024-02-03 ENCOUNTER — Telehealth: Payer: Self-pay | Admitting: Nurse Practitioner

## 2024-02-03 DIAGNOSIS — N186 End stage renal disease: Secondary | ICD-10-CM | POA: Diagnosis not present

## 2024-02-03 DIAGNOSIS — N2581 Secondary hyperparathyroidism of renal origin: Secondary | ICD-10-CM | POA: Diagnosis not present

## 2024-02-03 DIAGNOSIS — D631 Anemia in chronic kidney disease: Secondary | ICD-10-CM | POA: Diagnosis not present

## 2024-02-03 DIAGNOSIS — Z992 Dependence on renal dialysis: Secondary | ICD-10-CM | POA: Diagnosis not present

## 2024-02-03 DIAGNOSIS — D509 Iron deficiency anemia, unspecified: Secondary | ICD-10-CM | POA: Diagnosis not present

## 2024-02-03 NOTE — Telephone Encounter (Signed)
 Patient would like to use portable oxygen and need prescription for Equip 5 Care to be sent to [facility]: [name] [phone]. It needs to have documentation of his supplemental oxygen use.

## 2024-02-03 NOTE — Telephone Encounter (Signed)
 I called and spoke to pt wife. Wife states he would like a POC. Pt is currently using tanks. Pt is wanting the "[employer] Equip 5". This is a continuous flow. Pt can't handle pulse oxygen. Pt rented one from [employer] before. Wife states this would be easier for pt to carry around and would like a smaller one as possible. Please advise.

## 2024-02-04 ENCOUNTER — Encounter: Admitting: *Deleted

## 2024-02-04 DIAGNOSIS — J449 Chronic obstructive pulmonary disease, unspecified: Secondary | ICD-10-CM | POA: Diagnosis not present

## 2024-02-04 NOTE — Telephone Encounter (Signed)
 I called and spoke to the pt's wife, Elease Hashimoto. (DPR) Elease Hashimoto states she pays for the pt's o2 out of pocket. The oxygen comes from [facility] and she speaks to a man named Ross. Elease Hashimoto stated that [place] needs documentation from our office stating what info they need exactly to prove the usage/need of supplemental o2. I informed Elease Hashimoto that she needs to have Ross send [place] a fax from their office stating this so we could make sure the pt's medical information is being sent to the right place. Elease Hashimoto verbalized understanding. Our fax number was given to [facility]. NFN until we receive fax.   Fax # for [facility] given by [employer]-  [phone]

## 2024-02-04 NOTE — Progress Notes (Signed)
 Daily Session Note  Patient Details  Name: Jeremy [name] MRN: [id number] Date of Birth: 04/22/1941 Referring Provider:   [facility] from 01/19/2024 in [facility] Cardiac [facility] Pulmonary Rehab  Referring Provider Dr. Virl Diamond, MD       Encounter Date: 02/04/2024  Check In:  Session Check In - 02/04/24 1157       Check-In   Supervising physician immediately available to respond to emergencies See telemetry face sheet for immediately available ER MD    Location [facility]-Cardiac & Pulmonary Rehab    Staff Present Cora Collum, RN, BSN, CCRP;Joseph Hood RCP,RRT,BSRT;Noah Tickle, BS, Exercise Physiologist;Maxon Conetta BS, Exercise Physiologist;Jason Wallace Cullens RDN,LDN    Virtual Visit No    Medication changes reported     No    Fall or balance concerns reported    No    Warm-up and Cool-down Performed on first and last piece of equipment    Resistance Training Performed Yes    VAD Patient? No    PAD/SET Patient? No      Pain Assessment   Currently in Pain? No/denies                Social History   Tobacco Use  Smoking Status Former   Current packs/day: 0.00   Average packs/day: 0.2 packs/day for 61.5 years (12.3 ttl pk-yrs)   Types: Cigarettes   Start date: 12/29/1961   Quit date: 07/2023   Years since quitting: 0.5   Passive exposure: Never  Smokeless Tobacco Never  Tobacco Comments   Smokes 1/2 pack a day    Pt states he quit smoking 2 months ago. AB, CMA 10-07-23    Goals Met:  Proper associated with RPD/PD & O2 Sat Independence with exercise equipment Exercise tolerated well No report of concerns or symptoms today  Goals Unmet:  Not Applicable  Comments: Pt able to follow exercise prescription today without complaint.  Will continue to monitor for progression.    Dr. Bethann Punches is Medical Director for [facility] Cardiac Rehabilitation.  Dr. Vida Rigger is Medical Director for [facility] Pulmonary Rehabilitation.

## 2024-02-04 NOTE — Telephone Encounter (Signed)
 He'd have to have a walk test/appt for POC qualification. However, pulsed therapy is what a POC is so if he hasn't been able to tolerate pulsed dose therapy in the past, he may not be able to now either.

## 2024-02-04 NOTE — Progress Notes (Signed)
 No change from last CXR. He still has findings of chronic bronchitis and some vague, non specific opacities. Possible volume overload related given hx. He had recent HRCT chest ordered by [facility]. Will wait on results of this as it will provide more information. He needs follow up to establish care with Dr. Wynona Neat, next available appt. Never scheduled after his last visit. Thanks.

## 2024-02-05 ENCOUNTER — Telehealth: Payer: Self-pay | Admitting: Primary Care

## 2024-02-05 DIAGNOSIS — D509 Iron deficiency anemia, unspecified: Secondary | ICD-10-CM | POA: Diagnosis not present

## 2024-02-05 DIAGNOSIS — N186 End stage renal disease: Secondary | ICD-10-CM | POA: Diagnosis not present

## 2024-02-05 DIAGNOSIS — D631 Anemia in chronic kidney disease: Secondary | ICD-10-CM | POA: Diagnosis not present

## 2024-02-05 DIAGNOSIS — Z992 Dependence on renal dialysis: Secondary | ICD-10-CM | POA: Diagnosis not present

## 2024-02-05 DIAGNOSIS — N2581 Secondary hyperparathyroidism of renal origin: Secondary | ICD-10-CM | POA: Diagnosis not present

## 2024-02-05 NOTE — Telephone Encounter (Signed)
 Waynetta Sandy will be back in office tomorrow to sign this.

## 2024-02-05 NOTE — Telephone Encounter (Signed)
 CMN from [employer] received for oxygen on 02/05/24.

## 2024-02-05 NOTE — Telephone Encounter (Signed)
 [facility] Records called to ask if we received a fax from the 02 supplier as PT has emailed and called them today. I advised that we did get the fax (see note below)   and put in the providers box for signature.[place] [facility] asked that we expedite this.

## 2024-02-06 ENCOUNTER — Encounter

## 2024-02-06 ENCOUNTER — Telehealth: Payer: Self-pay

## 2024-02-06 NOTE — Telephone Encounter (Signed)
 CMN signed and faxed. Confirmation received, placed in scan

## 2024-02-06 NOTE — Telephone Encounter (Signed)
 Copied from CRM [id number]. Topic: Medical Record Request - Other >> Feb 06, 2024 11:24 AM Renie Ora wrote: Reason for CRM: [name] with [employer] called requesting paperwork be faxed over to [phone] stating the patient uses supplemental oxygen, patient is receiving oxygen and they need it in order to release to the patient.  Buelah Manis, NP signed this. This was placed in the red CMN folder and placed in the fax folder in B pod. NFN

## 2024-02-07 DIAGNOSIS — D509 Iron deficiency anemia, unspecified: Secondary | ICD-10-CM | POA: Diagnosis not present

## 2024-02-07 DIAGNOSIS — D631 Anemia in chronic kidney disease: Secondary | ICD-10-CM | POA: Diagnosis not present

## 2024-02-07 DIAGNOSIS — N2581 Secondary hyperparathyroidism of renal origin: Secondary | ICD-10-CM | POA: Diagnosis not present

## 2024-02-07 DIAGNOSIS — N186 End stage renal disease: Secondary | ICD-10-CM | POA: Diagnosis not present

## 2024-02-07 DIAGNOSIS — Z992 Dependence on renal dialysis: Secondary | ICD-10-CM | POA: Diagnosis not present

## 2024-02-08 ENCOUNTER — Other Ambulatory Visit: Payer: Self-pay | Admitting: Primary Care

## 2024-02-09 ENCOUNTER — Encounter: Admitting: *Deleted

## 2024-02-09 ENCOUNTER — Encounter

## 2024-02-09 DIAGNOSIS — J449 Chronic obstructive pulmonary disease, unspecified: Secondary | ICD-10-CM | POA: Diagnosis not present

## 2024-02-09 NOTE — Progress Notes (Signed)
 Daily Session Note  Patient Details  Name: Jeremy [name] MRN: [id number] Date of Birth: Mar 23, 1941 Referring Provider:   [facility] from 01/19/2024 in [facility] Cardiac and Pulmonary Rehab  Referring Provider Dr. Virl Diamond, MD       Encounter Date: 02/09/2024  Check In:  Session Check In - 02/09/24 1339       Check-In   Supervising physician immediately available to respond to emergencies See telemetry face sheet for immediately available ER MD    Location [facility]-Cardiac & Pulmonary Rehab    Staff Present Susann Givens RN,BSN;[facility] BS, Exercise Physiologist;Noah Tickle, BS, Exercise Physiologist    Virtual Visit No    Medication changes reported     No    Fall or balance concerns reported    No    Warm-up and Cool-down Performed on first and last piece of equipment    Resistance Training Performed Yes    VAD Patient? No    PAD/SET Patient? No      Pain Assessment   Currently in Pain? No/denies                Social History   Tobacco Use  Smoking Status Former   Current packs/day: 0.00   Average packs/day: 0.2 packs/day for 61.5 years (12.3 ttl pk-yrs)   Types: Cigarettes   Start date: 12/29/1961   Quit date: 07/2023   Years since quitting: 0.5   Passive exposure: Never  Smokeless Tobacco Never  Tobacco Comments   Smokes 1/2 pack a day    Pt states he quit smoking 2 months ago. AB, CMA 10-07-23    Goals Met:  Independence with exercise equipment Exercise tolerated well No report of concerns or symptoms today Strength training completed today  Goals Unmet:  Not Applicable  Comments: Pt able to follow exercise prescription today without complaint.  Will continue to monitor for progression.    Dr. Bethann Punches is Medical Director for [facility] Cardiac Rehabilitation.  Dr. Vida Rigger is Medical Director for [facility] Pulmonary Rehabilitation.

## 2024-02-10 DIAGNOSIS — Z992 Dependence on renal dialysis: Secondary | ICD-10-CM | POA: Diagnosis not present

## 2024-02-10 DIAGNOSIS — D509 Iron deficiency anemia, unspecified: Secondary | ICD-10-CM | POA: Diagnosis not present

## 2024-02-10 DIAGNOSIS — N2581 Secondary hyperparathyroidism of renal origin: Secondary | ICD-10-CM | POA: Diagnosis not present

## 2024-02-10 DIAGNOSIS — I129 Hypertensive chronic kidney disease with stage 1 through stage 4 chronic kidney disease, or unspecified chronic kidney disease: Secondary | ICD-10-CM | POA: Diagnosis not present

## 2024-02-10 DIAGNOSIS — N186 End stage renal disease: Secondary | ICD-10-CM | POA: Diagnosis not present

## 2024-02-11 ENCOUNTER — Encounter

## 2024-02-11 ENCOUNTER — Encounter: Attending: Pulmonary Disease | Admitting: *Deleted

## 2024-02-11 DIAGNOSIS — J449 Chronic obstructive pulmonary disease, unspecified: Secondary | ICD-10-CM | POA: Diagnosis not present

## 2024-02-11 NOTE — Progress Notes (Signed)
 Daily Session Note  Patient Details  Name: Jeremy [name] MRN: [id number] Date of Birth: [birth year] Referring Provider:   [facility] from 01/19/2024 in [facility] Cardiac and Pulmonary Rehab  Referring Provider Dr. Virl Diamond, MD       Encounter Date: 02/11/2024  Check In:  Session Check In - 02/11/24 1349       Check-In   Supervising physician immediately available to respond to emergencies See telemetry face sheet for immediately available ER MD    Location [facility]-Cardiac & Pulmonary Rehab    Staff Present Susann Givens RN,BSN;Kelly Madilyn Fireman BS, ACSM CEP, Exercise Physiologist;Noah Tickle, BS, Exercise Physiologist;Jason Wallace Cullens RDN,LDN    Virtual Visit No    Medication changes reported     No    Fall or balance concerns reported    No    Warm-up and Cool-down Performed on first and last piece of equipment    Resistance Training Performed Yes    VAD Patient? No    PAD/SET Patient? No      Pain Assessment   Currently in Pain? No/denies                Social History   Tobacco Use  Smoking Status Former   Current packs/day: 0.00   Average packs/day: 0.2 packs/day for 61.5 years (12.3 ttl pk-yrs)   Types: Cigarettes   Start date: 12/29/1961   Quit date: 07/2023   Years since quitting: 0.5   Passive exposure: Never  Smokeless Tobacco Never  Tobacco Comments   Smokes 1/2 pack a day    Pt states he quit smoking 2 months ago. AB, CMA 10-07-23    Goals Met:  Independence with exercise equipment Exercise tolerated well No report of concerns or symptoms today Strength training completed today  Goals Unmet:  Not Applicable  Comments: Pt able to follow exercise prescription today without complaint.  Will continue to monitor for progression.    Dr. Bethann Punches is Medical Director for [facility] Cardiac Rehabilitation.  Dr. Vida Rigger is Medical Director for [facility] Pulmonary Rehabilitation.

## 2024-02-12 DIAGNOSIS — D509 Iron deficiency anemia, unspecified: Secondary | ICD-10-CM | POA: Diagnosis not present

## 2024-02-12 DIAGNOSIS — N186 End stage renal disease: Secondary | ICD-10-CM | POA: Diagnosis not present

## 2024-02-12 DIAGNOSIS — Z992 Dependence on renal dialysis: Secondary | ICD-10-CM | POA: Diagnosis not present

## 2024-02-12 DIAGNOSIS — N2581 Secondary hyperparathyroidism of renal origin: Secondary | ICD-10-CM | POA: Diagnosis not present

## 2024-02-13 ENCOUNTER — Inpatient Hospital Stay (HOSPITAL_COMMUNITY)

## 2024-02-13 ENCOUNTER — Ambulatory Visit
Admission: EM | Admit: 2024-02-13 | Discharge: 2024-02-13 | Disposition: A | Discharge status: Home / Self Care | Attending: Family Medicine | Admitting: Family Medicine

## 2024-02-13 ENCOUNTER — Encounter

## 2024-02-13 ENCOUNTER — Ambulatory Visit (HOSPITAL_BASED_OUTPATIENT_CLINIC_OR_DEPARTMENT_OTHER): Payer: Self-pay | Admitting: Orthopaedic Surgery

## 2024-02-13 ENCOUNTER — Emergency Department (HOSPITAL_COMMUNITY)

## 2024-02-13 ENCOUNTER — Inpatient Hospital Stay (HOSPITAL_COMMUNITY)
Admission: EM | Admit: 2024-02-13 | Discharge: 2024-02-23 | DRG: 480 | Disposition: A | Discharge status: Skilled Nursing Facility | Source: Ambulatory Visit | Attending: Internal Medicine | Admitting: Internal Medicine

## 2024-02-13 ENCOUNTER — Ambulatory Visit: Admitting: Radiology

## 2024-02-13 ENCOUNTER — Encounter (HOSPITAL_COMMUNITY): Payer: Self-pay

## 2024-02-13 ENCOUNTER — Other Ambulatory Visit: Payer: Self-pay

## 2024-02-13 DIAGNOSIS — N401 Enlarged prostate with lower urinary tract symptoms: Secondary | ICD-10-CM | POA: Diagnosis present

## 2024-02-13 DIAGNOSIS — I10 Essential (primary) hypertension: Secondary | ICD-10-CM | POA: Diagnosis present

## 2024-02-13 DIAGNOSIS — R1312 Dysphagia, oropharyngeal phase: Secondary | ICD-10-CM | POA: Diagnosis not present

## 2024-02-13 DIAGNOSIS — N2581 Secondary hyperparathyroidism of renal origin: Secondary | ICD-10-CM | POA: Diagnosis present

## 2024-02-13 DIAGNOSIS — Z880 Allergy status to penicillin: Secondary | ICD-10-CM

## 2024-02-13 DIAGNOSIS — F17210 Nicotine dependence, cigarettes, uncomplicated: Secondary | ICD-10-CM | POA: Diagnosis present

## 2024-02-13 DIAGNOSIS — S0003XA Contusion of scalp, initial encounter: Secondary | ICD-10-CM | POA: Diagnosis not present

## 2024-02-13 DIAGNOSIS — E440 Moderate protein-calorie malnutrition: Secondary | ICD-10-CM | POA: Diagnosis not present

## 2024-02-13 DIAGNOSIS — Z79899 Other long term (current) drug therapy: Secondary | ICD-10-CM

## 2024-02-13 DIAGNOSIS — Z9181 History of falling: Secondary | ICD-10-CM | POA: Diagnosis not present

## 2024-02-13 DIAGNOSIS — Z85828 Personal history of other malignant neoplasm of skin: Secondary | ICD-10-CM | POA: Diagnosis not present

## 2024-02-13 DIAGNOSIS — E875 Hyperkalemia: Secondary | ICD-10-CM | POA: Diagnosis not present

## 2024-02-13 DIAGNOSIS — I5032 Chronic diastolic (congestive) heart failure: Secondary | ICD-10-CM | POA: Diagnosis present

## 2024-02-13 DIAGNOSIS — I120 Hypertensive chronic kidney disease with stage 5 chronic kidney disease or end stage renal disease: Secondary | ICD-10-CM | POA: Diagnosis not present

## 2024-02-13 DIAGNOSIS — I132 Hypertensive heart and chronic kidney disease with heart failure and with stage 5 chronic kidney disease, or end stage renal disease: Secondary | ICD-10-CM | POA: Diagnosis present

## 2024-02-13 DIAGNOSIS — Z7982 Long term (current) use of aspirin: Secondary | ICD-10-CM | POA: Diagnosis not present

## 2024-02-13 DIAGNOSIS — Z8551 Personal history of malignant neoplasm of bladder: Secondary | ICD-10-CM | POA: Diagnosis not present

## 2024-02-13 DIAGNOSIS — S7001XA Contusion of right hip, initial encounter: Secondary | ICD-10-CM

## 2024-02-13 DIAGNOSIS — W010XXA Fall on same level from slipping, tripping and stumbling without subsequent striking against object, initial encounter: Principal | ICD-10-CM | POA: Diagnosis present

## 2024-02-13 DIAGNOSIS — S7291XA Unspecified fracture of right femur, initial encounter for closed fracture: Secondary | ICD-10-CM | POA: Diagnosis present

## 2024-02-13 DIAGNOSIS — J449 Chronic obstructive pulmonary disease, unspecified: Secondary | ICD-10-CM | POA: Diagnosis present

## 2024-02-13 DIAGNOSIS — S72001A Fracture of unspecified part of neck of right femur, initial encounter for closed fracture: Secondary | ICD-10-CM

## 2024-02-13 DIAGNOSIS — N186 End stage renal disease: Secondary | ICD-10-CM | POA: Diagnosis present

## 2024-02-13 DIAGNOSIS — Z472 Encounter for removal of internal fixation device: Secondary | ICD-10-CM | POA: Diagnosis not present

## 2024-02-13 DIAGNOSIS — R2689 Other abnormalities of gait and mobility: Secondary | ICD-10-CM | POA: Diagnosis not present

## 2024-02-13 DIAGNOSIS — Z9981 Dependence on supplemental oxygen: Secondary | ICD-10-CM

## 2024-02-13 DIAGNOSIS — J4489 Other specified chronic obstructive pulmonary disease: Secondary | ICD-10-CM | POA: Diagnosis not present

## 2024-02-13 DIAGNOSIS — Z743 Need for continuous supervision: Secondary | ICD-10-CM | POA: Diagnosis not present

## 2024-02-13 DIAGNOSIS — M1611 Unilateral primary osteoarthritis, right hip: Secondary | ICD-10-CM | POA: Diagnosis not present

## 2024-02-13 DIAGNOSIS — R4182 Altered mental status, unspecified: Secondary | ICD-10-CM | POA: Diagnosis not present

## 2024-02-13 DIAGNOSIS — D631 Anemia in chronic kidney disease: Secondary | ICD-10-CM | POA: Diagnosis present

## 2024-02-13 DIAGNOSIS — J952 Acute pulmonary insufficiency following nonthoracic surgery: Secondary | ICD-10-CM | POA: Diagnosis not present

## 2024-02-13 DIAGNOSIS — S32511A Fracture of superior rim of right pubis, initial encounter for closed fracture: Secondary | ICD-10-CM

## 2024-02-13 DIAGNOSIS — M80051A Age-related osteoporosis with current pathological fracture, right femur, initial encounter for fracture: Principal | ICD-10-CM | POA: Diagnosis present

## 2024-02-13 DIAGNOSIS — M25551 Pain in right hip: Secondary | ICD-10-CM | POA: Diagnosis not present

## 2024-02-13 DIAGNOSIS — Z888 Allergy status to other drugs, medicaments and biological substances status: Secondary | ICD-10-CM

## 2024-02-13 DIAGNOSIS — S72044A Nondisplaced fracture of base of neck of right femur, initial encounter for closed fracture: Secondary | ICD-10-CM

## 2024-02-13 DIAGNOSIS — W19XXXA Unspecified fall, initial encounter: Secondary | ICD-10-CM

## 2024-02-13 DIAGNOSIS — T148XXA Other injury of unspecified body region, initial encounter: Secondary | ICD-10-CM

## 2024-02-13 DIAGNOSIS — E785 Hyperlipidemia, unspecified: Secondary | ICD-10-CM | POA: Diagnosis present

## 2024-02-13 DIAGNOSIS — S72001D Fracture of unspecified part of neck of right femur, subsequent encounter for closed fracture with routine healing: Secondary | ICD-10-CM | POA: Diagnosis not present

## 2024-02-13 DIAGNOSIS — M25512 Pain in left shoulder: Secondary | ICD-10-CM | POA: Diagnosis not present

## 2024-02-13 DIAGNOSIS — R030 Elevated blood-pressure reading, without diagnosis of hypertension: Secondary | ICD-10-CM

## 2024-02-13 DIAGNOSIS — Z992 Dependence on renal dialysis: Secondary | ICD-10-CM | POA: Diagnosis not present

## 2024-02-13 DIAGNOSIS — G9341 Metabolic encephalopathy: Secondary | ICD-10-CM | POA: Diagnosis not present

## 2024-02-13 DIAGNOSIS — M6281 Muscle weakness (generalized): Secondary | ICD-10-CM | POA: Diagnosis not present

## 2024-02-13 DIAGNOSIS — I700 Atherosclerosis of aorta: Secondary | ICD-10-CM | POA: Diagnosis not present

## 2024-02-13 DIAGNOSIS — J690 Pneumonitis due to inhalation of food and vomit: Secondary | ICD-10-CM | POA: Diagnosis not present

## 2024-02-13 DIAGNOSIS — M4696 Unspecified inflammatory spondylopathy, lumbar region: Secondary | ICD-10-CM | POA: Diagnosis not present

## 2024-02-13 DIAGNOSIS — J99 Respiratory disorders in diseases classified elsewhere: Secondary | ICD-10-CM | POA: Diagnosis not present

## 2024-02-13 DIAGNOSIS — R338 Other retention of urine: Secondary | ICD-10-CM | POA: Diagnosis present

## 2024-02-13 DIAGNOSIS — J9610 Chronic respiratory failure, unspecified whether with hypoxia or hypercapnia: Secondary | ICD-10-CM | POA: Diagnosis not present

## 2024-02-13 DIAGNOSIS — R918 Other nonspecific abnormal finding of lung field: Secondary | ICD-10-CM | POA: Diagnosis not present

## 2024-02-13 DIAGNOSIS — Z882 Allergy status to sulfonamides status: Secondary | ICD-10-CM

## 2024-02-13 DIAGNOSIS — J9621 Acute and chronic respiratory failure with hypoxia: Secondary | ICD-10-CM | POA: Diagnosis not present

## 2024-02-13 DIAGNOSIS — F05 Delirium due to known physiological condition: Secondary | ICD-10-CM | POA: Diagnosis not present

## 2024-02-13 DIAGNOSIS — J9622 Acute and chronic respiratory failure with hypercapnia: Secondary | ICD-10-CM | POA: Diagnosis not present

## 2024-02-13 DIAGNOSIS — R1310 Dysphagia, unspecified: Secondary | ICD-10-CM | POA: Diagnosis present

## 2024-02-13 DIAGNOSIS — M85812 Other specified disorders of bone density and structure, left shoulder: Secondary | ICD-10-CM | POA: Diagnosis not present

## 2024-02-13 DIAGNOSIS — J9611 Chronic respiratory failure with hypoxia: Secondary | ICD-10-CM | POA: Diagnosis present

## 2024-02-13 DIAGNOSIS — S79929A Unspecified injury of unspecified thigh, initial encounter: Secondary | ICD-10-CM | POA: Diagnosis not present

## 2024-02-13 DIAGNOSIS — D8481 Immunodeficiency due to conditions classified elsewhere: Secondary | ICD-10-CM | POA: Diagnosis not present

## 2024-02-13 DIAGNOSIS — I959 Hypotension, unspecified: Secondary | ICD-10-CM | POA: Diagnosis not present

## 2024-02-13 DIAGNOSIS — T8484XD Pain due to internal orthopedic prosthetic devices, implants and grafts, subsequent encounter: Secondary | ICD-10-CM | POA: Diagnosis not present

## 2024-02-13 DIAGNOSIS — R0902 Hypoxemia: Secondary | ICD-10-CM | POA: Diagnosis not present

## 2024-02-13 DIAGNOSIS — S72051A Unspecified fracture of head of right femur, initial encounter for closed fracture: Secondary | ICD-10-CM | POA: Diagnosis not present

## 2024-02-13 DIAGNOSIS — J439 Emphysema, unspecified: Secondary | ICD-10-CM | POA: Diagnosis present

## 2024-02-13 DIAGNOSIS — S72011D Unspecified intracapsular fracture of right femur, subsequent encounter for closed fracture with routine healing: Secondary | ICD-10-CM | POA: Diagnosis not present

## 2024-02-13 DIAGNOSIS — Z87891 Personal history of nicotine dependence: Secondary | ICD-10-CM | POA: Diagnosis not present

## 2024-02-13 DIAGNOSIS — G4733 Obstructive sleep apnea (adult) (pediatric): Secondary | ICD-10-CM | POA: Diagnosis not present

## 2024-02-13 DIAGNOSIS — R41841 Cognitive communication deficit: Secondary | ICD-10-CM | POA: Diagnosis not present

## 2024-02-13 DIAGNOSIS — M8580 Other specified disorders of bone density and structure, unspecified site: Secondary | ICD-10-CM | POA: Diagnosis not present

## 2024-02-13 DIAGNOSIS — J42 Unspecified chronic bronchitis: Secondary | ICD-10-CM | POA: Diagnosis not present

## 2024-02-13 DIAGNOSIS — M4982 Spondylopathy in diseases classified elsewhere, cervical region: Secondary | ICD-10-CM | POA: Diagnosis not present

## 2024-02-13 HISTORY — DX: Dependence on renal dialysis: N18.6

## 2024-02-13 LAB — TYPE AND SCREEN
ABO/RH(D): O POS
Antibody Screen: NEGATIVE

## 2024-02-13 LAB — BASIC METABOLIC PANEL WITH GFR
Anion gap: 14 (ref 5–15)
BUN: 42 mg/dL — ABNORMAL HIGH (ref 8–23)
CO2: 29 mmol/L (ref 22–32)
Calcium: 8.8 mg/dL — ABNORMAL LOW (ref 8.9–10.3)
Chloride: 98 mmol/L (ref 98–111)
Creatinine, Ser: 6.27 mg/dL — ABNORMAL HIGH (ref 0.61–1.24)
GFR, Estimated: 8 mL/min — ABNORMAL LOW (ref >60)
Glucose, Bld: 111 mg/dL — ABNORMAL HIGH (ref 70–99)
Potassium: 4.6 mmol/L (ref 3.5–5.1)
Sodium: 141 mmol/L (ref 135–145)

## 2024-02-13 LAB — CBC
HCT: 28.1 % — ABNORMAL LOW (ref 39.0–52.0)
Hemoglobin: 8.9 g/dL — ABNORMAL LOW (ref 13.0–17.0)
MCH: 32.5 pg (ref 26.0–34.0)
MCHC: 31.7 g/dL (ref 30.0–36.0)
MCV: 102.6 fL — ABNORMAL HIGH (ref 80.0–100.0)
Platelets: 272 10*3/uL (ref 150–400)
RBC: 2.74 MIL/uL — ABNORMAL LOW (ref 4.22–5.81)
RDW: 13 % (ref 11.5–15.5)
WBC: 15.1 10*3/uL — ABNORMAL HIGH (ref 4.0–10.5)
nRBC: 0 % (ref 0.0–0.2)

## 2024-02-13 MED ORDER — REVEFENACIN 175 MCG/3ML IN SOLN
175.0000 ug | Freq: Every day | RESPIRATORY_TRACT | Status: DC
  Administered 2024-02-15 – 2024-02-23 (×9): 175 ug via RESPIRATORY_TRACT
  Filled 2024-02-13 (×10): qty 3

## 2024-02-13 MED ORDER — MELATONIN 5 MG PO TABS
5.0000 mg | ORAL_TABLET | Freq: Every evening | ORAL | Status: DC | PRN
  Administered 2024-02-13 – 2024-02-16 (×3): 5 mg via ORAL
  Filled 2024-02-13 (×5): qty 1

## 2024-02-13 MED ORDER — BUDESONIDE 0.5 MG/2ML IN SUSP
0.5000 mg | Freq: Two times a day (BID) | RESPIRATORY_TRACT | Status: DC
  Administered 2024-02-14 – 2024-02-23 (×17): 0.5 mg via RESPIRATORY_TRACT
  Filled 2024-02-13 (×19): qty 2

## 2024-02-13 MED ORDER — SEVELAMER CARBONATE 2.4 G PO PACK
2.4000 g | PACK | Freq: Three times a day (TID) | ORAL | Status: DC
  Administered 2024-02-15 – 2024-02-23 (×24): 2.4 g via ORAL
  Filled 2024-02-13 (×30): qty 1

## 2024-02-13 MED ORDER — ARFORMOTEROL TARTRATE 15 MCG/2ML IN NEBU
15.0000 ug | INHALATION_SOLUTION | Freq: Two times a day (BID) | RESPIRATORY_TRACT | Status: DC
  Administered 2024-02-14 – 2024-02-23 (×17): 15 ug via RESPIRATORY_TRACT
  Filled 2024-02-13 (×19): qty 2

## 2024-02-13 MED ORDER — ACETAMINOPHEN 325 MG PO TABS
650.0000 mg | ORAL_TABLET | Freq: Four times a day (QID) | ORAL | Status: DC | PRN
  Administered 2024-02-14 – 2024-02-20 (×6): 650 mg via ORAL
  Filled 2024-02-13 (×7): qty 2

## 2024-02-13 MED ORDER — HYDROMORPHONE HCL 1 MG/ML IJ SOLN
0.5000 mg | INTRAMUSCULAR | Status: DC | PRN
  Administered 2024-02-14 (×2): 0.5 mg via INTRAVENOUS
  Filled 2024-02-13 (×2): qty 0.5

## 2024-02-13 MED ORDER — BIOTENE DRY MOUTH MT LIQD: 2.0000 | OROMUCOSAL | Status: DC | PRN

## 2024-02-13 MED ORDER — METOPROLOL TARTRATE 12.5 MG HALF TABLET
12.5000 mg | ORAL_TABLET | Freq: Two times a day (BID) | ORAL | Status: DC
  Administered 2024-02-13 – 2024-02-23 (×16): 12.5 mg via ORAL
  Filled 2024-02-13 (×19): qty 1

## 2024-02-13 MED ORDER — POLYETHYLENE GLYCOL 3350 17 G PO PACK
17.0000 g | PACK | Freq: Every day | ORAL | Status: DC | PRN
Start: 2024-02-13 — End: 2024-02-17
  Administered 2024-02-17: 17 g via ORAL
  Filled 2024-02-13: qty 1

## 2024-02-13 MED ORDER — PROCHLORPERAZINE EDISYLATE 10 MG/2ML IJ SOLN
5.0000 mg | Freq: Four times a day (QID) | INTRAMUSCULAR | Status: DC | PRN
  Filled 2024-02-13: qty 2

## 2024-02-13 MED ORDER — ONDANSETRON HCL 4 MG/2ML IJ SOLN
4.0000 mg | Freq: Once | INTRAMUSCULAR | Status: AC
  Administered 2024-02-13: 4 mg via INTRAVENOUS
  Filled 2024-02-13: qty 2

## 2024-02-13 MED ORDER — OXYCODONE HCL 5 MG PO TABS
5.0000 mg | ORAL_TABLET | Freq: Four times a day (QID) | ORAL | Status: AC | PRN
  Administered 2024-02-14 – 2024-02-17 (×4): 5 mg via ORAL
  Filled 2024-02-13 (×4): qty 1

## 2024-02-13 MED ORDER — MORPHINE SULFATE (PF) 4 MG/ML IV SOLN
4.0000 mg | Freq: Once | INTRAVENOUS | Status: AC
  Administered 2024-02-13: 4 mg via INTRAVENOUS
  Filled 2024-02-13: qty 1

## 2024-02-13 MED ORDER — OLOPATADINE HCL 0.1 % OP SOLN
1.0000 [drp] | Freq: Two times a day (BID) | OPHTHALMIC | Status: DC
  Administered 2024-02-14 – 2024-02-23 (×18): 1 [drp] via OPHTHALMIC
  Filled 2024-02-13: qty 5

## 2024-02-13 NOTE — H&P (Signed)
 History and Physical  Jeremy [name] JYN:[id number] DOB: June 18, 1941 DOA: 02/13/2024  Referring physician: Dr. Denton Lank, EDP  PCP: Everrett Coombe, DO  Outpatient Specialists: Cardiology, pulmonary, podiatry. Patient coming from: Home  Chief Complaint: Fall.  HPI: Jeremy [name] is a 83 y.o. male with medical history significant for COPD, on 3 L oxygen supplementation at baseline, OSA on CPAP, ESRD on HD TTS, chronic HFpEF, hyperlipidemia, history of bladder cancer, macular degeneration, who presents to the ER after a mechanical fall for which he incurred right femoral fracture.  The [place] around 1 PM while walking to get onto the elevator.  He landed on his right side.  Initially presented to urgent care.  Endorses severe pain in his right hip worse with weightbearing or movement.  He had an x-ray of his right hip done at urgent care which showed concern for right femoral head fracture.  He was advised to go to the ER for CT scan imaging.  In the ER, hypertensive with SBP over 200, likely contributed by pain.  CT right hip showed minimally impacted subcapital fracture of the right femoral head and neck junction.  Nondisplaced fracture of the right superior pubic ramus.  Intramuscular hematoma within the right iliacus muscle.  EDP discussed the case with orthopedic surgery.  Plan for orthopedic surgical repair tomorrow.  N.p.o. after midnight.  TRH, hospitalist service, was asked to admit.  ED Course: Temperature 99.0.  BP 143/65, pulse 86, respiration rate 18, O2 saturation 91% on room air.  Lab studies notable for WBC 15.1, hemoglobin 8.9, MCV 102.6, platelet 272.  Review of Systems: Review of systems as noted in the HPI. All other systems reviewed and are negative.   Past Medical History:  Diagnosis Date   Anemia    Cancer [facility])    patient and family have not been told that he has cancer.   Chronic kidney disease    dialysis T/Th/Sa   COPD  (chronic obstructive pulmonary disease) (HCC)    Enlarged prostate    History of blood transfusion    Hyperlipidemia    Hypertension    Self-catheterizes urinary bladder    pt. does this morning and evening--been doing this since 11/2016   Sleep apnea    not using a cpap machine   Past Surgical History:  Procedure Laterality Date   A/V FISTULAGRAM Left 03/12/2023   Procedure: A/V Fistulagram;  Surgeon: Victorino Sparrow, MD;  Location: [facility] INVASIVE CV LAB;  Service: Cardiovascular;  Laterality: Left;   A/V FISTULAGRAM Left 10/31/2023   Procedure: A/V Fistulagram;  Surgeon: Ethelene Hal, MD;  Location: [facility] INVASIVE CV LAB;  Service: Cardiovascular;  Laterality: Left;   AV FISTULA PLACEMENT Left 10/16/2022   Procedure: LEFT BRACHIOCEPHALIC ARTERIOVENOUS (AV) FISTULA CREATION;  Surgeon: Cephus Shelling, MD;  Location: [facility] OR;  Service: Vascular;  Laterality: Left;   BACK SURGERY     BLADDER TUMOR EXCISION  2015   benign   BLEPHAROPLASTY  12/2018   LIGATION OF COMPETING BRANCHES OF ARTERIOVENOUS FISTULA Left 03/17/2023   Procedure: LEFT ARM FISTULA BRANCH LIGATION;  Surgeon: Victorino Sparrow, MD;  Location: [facility] OR;  Service: Vascular;  Laterality: Left;   LUMBAR LAMINECTOMY/DECOMPRESSION MICRODISCECTOMY Bilateral 08/25/2017   Procedure: Bilateral Lumbar Three- Four, Lumbar Four- Five, Lumbar Five- Sacral One Laminectomy;  Surgeon: Barnett Abu, MD;  Location: [facility] OR;  Service: Neurosurgery;  Laterality: Bilateral;  Bilateral L3-4 L4-5 L5-S1 Laminectomy   SKIN CANCER  EXCISION     Surg x2...   TONSILLECTOMY AND ADENOIDECTOMY     Surg as a child...   TRANSURETHRAL RESECTION OF BLADDER TUMOR N/A 01/03/2022   Procedure: TRANSURETHRAL RESECTION OF BLADDER TUMOR (TURBT)/ CYSTOSCOPY/ EXAM UNDER ANESTHESIA, BILATERAL RETROGRADE/ BLADDER BIOPSIES;  Surgeon: Heloise Purpura, MD;  Location: [facility] ORS;  Service: Urology;  Laterality: N/A;  GENERAL ANESTHESIA WITH PARALYSIS    Social History:  reports that he  quit smoking about 7 months ago. His smoking use included cigarettes. He started smoking about 62 years ago. He has a 12.3 pack-year smoking history. He has never been exposed to tobacco smoke. He has never used smokeless tobacco. He reports that he does not currently use alcohol. He reports that he does not use drugs.   Allergies  Allergen Reactions   Sulfa Antibiotics     Rash, light headed, shaking   Penicillins Other (See Comments)    hives severe as a child    Tape     Pulls skin off    Family History  Problem Relation Age of Onset   Colon cancer Neg Hx    Stomach cancer Neg Hx    Rectal cancer Neg Hx       Prior to Admission medications   Medication Sig Start Date End Date Taking? Authorizing Provider  antiseptic oral rinse (BIOTENE) LIQD 2 Applications by Mouth Rinse route as needed for dry mouth.   Yes [provider]  arformoterol (BROVANA) 15 MCG/2ML NEBU Substituted for: Brovana Neb Solution Inhale one vial in nebulizer twice a day. Patient taking differently: Take 15 mcg by nebulization in the morning and at bedtime. 02/09/24  Yes Glenford Bayley, NP  budesonide (PULMICORT) 0.5 MG/2ML nebulizer solution Generic for Pulmicort. Inhale one vial in nebulizer twice a day. Rinse mouth after use. Patient taking differently: Take 0.5 mg by nebulization in the morning and at bedtime. 02/09/24  Yes Glenford Bayley, NP  Cholecalciferol (VITAMIN D) 50 MCG (2000 UT) CAPS Take 2,000 Units by mouth daily. Crush and Mix with something   Yes [provider]  Dextromethorphan-guaiFENesin (MUCINEX DM PO) Take 20 mLs by mouth in the morning and at bedtime. Add to pudding or applesauce   Yes [provider]  ipratropium (ATROVENT) 0.03 % nasal spray Place 2 sprays into both nostrils 2 (two) times daily. 10/07/23  Yes Glenford Bayley, NP  Loperamide HCl (IMODIUM PO) Take 30 mLs by mouth as needed.   Yes [provider]  metoprolol tartrate (LOPRESSOR)  25 MG tablet Take 1 tablet (25 mg total) by mouth 2 (two) times daily. 12/01/23  Yes Everrett Coombe, DO  Multiple Vitamin (MULTIVITAMIN) LIQD Take 5 mLs by mouth daily.   Yes [provider]  Olopatadine HCl (PATADAY OP) Place 2 drops into both eyes as needed.   Yes [provider]  OXYGEN Inhale 3 L into the lungs continuous.   Yes [provider]  revefenacin (YUPELRI) 175 MCG/3ML nebulizer solution Inhale one vial in nebulizer once daily. Do not mix with other nebulized medications. Patient taking differently: Take 175 mcg by nebulization daily. 02/09/24  Yes Glenford Bayley, NP  sevelamer carbonate (RENVELA) 0.8 g PACK packet Take 0.8 g by mouth daily as needed (snacks). 10/28/23  Yes [provider]  sevelamer carbonate (RENVELA) 2.4 g PACK Take 2.4 g by mouth 3 (three) times daily with meals.   Yes [provider]  AMBULATORY NON FORMULARY MEDICATION Please provide portable O2 concentrator for  patient. Flow 2L/min, 24/7 use.  J96.10-Chronic respiratory failure. 09/12/23   Everrett Coombe, DO  AMBULATORY NON FORMULARY MEDICATION Please provide portable oxygen concentrator.  Flow 3L/min.  Dx: Chronic respiratory failure J96.11, COPD J42 09/22/23   Everrett Coombe, DO    Physical Exam: BP (!) 143/65 (BP Location: Right Arm)   Pulse 86   Temp 99 F (37.2 C) (Oral)   Resp 18   Ht 5\' 10"  (1.778 m)   Wt 71.2 kg   SpO2 91%   BMI 22.53 kg/m   General: 83 y.o. year-old male well developed well nourished in no acute distress.  Alert and oriented x3. Cardiovascular: Regular rate and rhythm with no rubs or gallops.  No thyromegaly or JVD noted.  No lower extremity edema bilaterally. Respiratory: Faint rales at bases.  Poor inspiratory effort. Abdomen: Soft nontender nondistended with normal bowel sounds x4 quadrants. Muskuloskeletal: No cyanosis or clubbing noted bilaterally Neuro: CN II-XII intact, strength, sensation, reflexes Skin: No ulcerative  lesions noted or rashes Psychiatry: Judgement and insight appear normal. Mood is appropriate for condition and setting          Labs on Admission:  Basic Metabolic Panel: Recent Labs  Lab 02/13/24 1821  NA 141  K 4.6  CL 98  CO2 29  GLUCOSE 111*  BUN 42*  CREATININE 6.27*  CALCIUM 8.8*   Liver Function Tests: No results for input(s): "AST", "ALT", "ALKPHOS", "BILITOT", "PROT", "ALBUMIN" in the last 168 hours. No results for input(s): "LIPASE", "AMYLASE" in the last 168 hours. No results for input(s): "AMMONIA" in the last 168 hours. CBC: Recent Labs  Lab 02/13/24 1821  WBC 15.1*  HGB 8.9*  HCT 28.1*  MCV 102.6*  PLT 272   Cardiac Enzymes: No results for input(s): "CKTOTAL", "CKMB", "CKMBINDEX", "TROPONINI" in the last 168 hours.  BNP (last 3 results) Recent Labs    08/11/23 0837 08/15/23 0853 08/19/23 2131  BNP 1,932.2* 2,935.7* 319.7*    ProBNP (last 3 results) No results for input(s): "PROBNP" in the last 8760 hours.  CBG: No results for input(s): "GLUCAP" in the last 168 hours.  Radiological Exams on Admission: CT Hip Right Wo Contrast Result Date: 02/13/2024 CLINICAL DATA:  Right hip pain after fall. EXAM: CT OF THE RIGHT HIP WITHOUT CONTRAST TECHNIQUE: Multidetector CT imaging of the right hip was performed according to the standard protocol. Multiplanar CT image reconstructions were also generated. RADIATION DOSE REDUCTION: This exam was performed according to the departmental dose-optimization program which includes automated exposure control, adjustment of the mA and/or kV according to patient size and/or use of iterative reconstruction technique. COMPARISON:  Right hip radiographs dated 02/13/2024. FINDINGS: Bones/Joint/Cartilage Minimally impacted subcapital fracture of the right femoral head neck junction. Nondisplaced fracture of the right superior pubic ramus. The right femoral head is seated within the acetabulum. Mild degenerative changes of the  right hip. The right sacroiliac joint appears intact. Degenerative changes of the visualized lower lumbar spine. Ligaments Ligaments are suboptimally evaluated by CT. Muscles and Tendons Fullness and heterogeneity of the right iliacus muscle, compatible with intramuscular hematoma. Soft tissue Soft tissue swelling along posterolateral hip. Aortoiliac atherosclerosis. IMPRESSION: 1. Minimally impacted subcapital fracture of the right femoral head and neck junction. 2. Nondisplaced fracture of the right superior pubic ramus. 3. Intramuscular hematoma within the right iliacus muscle. Electronically Signed   By: Hart Robinsons M.D.   On: 02/13/2024 20:09   DG Hip Unilat With Pelvis 2-3 Views Right Result Date: 02/13/2024 CLINICAL DATA:  Right hip pain following a fall today. EXAM: DG HIP (WITH OR WITHOUT PELVIS) 2-3V RIGHT COMPARISON:  None Available. FINDINGS: Diffuse osteopenia. Probable minimally compressed right subcapital femoral neck fracture. Atheromatous arterial calcifications. Small amount of barium retained in distal descending colon diverticula. IMPRESSION: 1. Probable minimally compressed right subcapital femoral neck fracture. Recommend confirmation with a right hip CT without contrast. 2. Diffuse osteopenia. Electronically Signed   By: Beckie Salts M.D.   On: 02/13/2024 15:54    EKG: I independently viewed the EKG done and my findings are as followed: None available at the time of this visit.  Assessment/Plan Present on Admission:  Right femoral fracture [facility])  Principal Problem:   Right femoral fracture (HCC)  Right femoral fracture, status post mechanical fall As needed analgesics Orthopedic surgery consulted by EDP, plan for orthopedic surgical repair on 02/14/2024. N.p.o. after midnight Repeat CBC in the morning Type and screen  Anemia of chronic disease Hemoglobin on presentation 8.9 Repeat H&H and obtain type and screen  Leukocytosis, suspect reactive from femur  fracture Presented with WBC of 15.1 No active disease seen on chest x-ray Monitor for signs of infection and treat if indicated Monitor fever curve and WBCs  Chronic hypoxic respiratory failure on 3 L nasal cannula continuously COPD Follows with pulmonary outpatient Currently at baseline Resume home regimen  Chronic HFpEF Follows with cardiology outpatient Euvolemic on exam Volume status managed with hemodialysis  ESRD on HD TTS Nephrology consulted by EDP Resume home schedule of hemodialysis sessions Volume status and electrolytes managed with hemodialysis  OSA Resume CPAP nightly  Dysphagia on diet with nectar thick liquid  Aspiration precautions   Time: 75 minutes.   DVT prophylaxis: SCDs, defer chemical DVT prophylaxis to orthopedic surgery.  Code Status: Full code.  Family Communication: None at bedside.  Disposition Plan: Admitted to telemetry surgical unit.  Consults called: Orthopedic surgery consulted by EDP.  Admission status: Inpatient status.   Status is: Inpatient The patient requires at least 2 midnights for further evaluation and treatment of present condition.   Darlin Drop MD [facility] Pager [phone]  If 7PM-7AM, please contact night-coverage [email or web address] Password [facility]  02/13/2024, 9:14 PM

## 2024-02-13 NOTE — ED Provider Notes (Signed)
 [place] [facility] AT [facility] Provider Note   CSN: [id number] Arrival date & time: 02/13/24  1634     History  Chief Complaint  Patient presents with   Jeremy [name] is a 84 y.o. male.  Pt indicates was walking and foot got caught on edge of rug causing him to fall earlier today. No faintness or dizziness prior to fall. With fall, bumped head but no loc. Has been ambulatory since but with increased pain right hip. Skin tears to right upper arm and lower leg. Tetanus up to date. Denies headache. No neck or back pain. No chest pain or sob. No abd pain or nv. No other extremity pain or injury. Uses home o2 continuously. No anticoagulant use. Denies leg numbness/weakness. Went to urgent care, had xrays with suspected right hip fx and was sent to ED.   The history is provided by the patient and medical records.  Fall Pertinent negatives include no chest pain, no abdominal pain, no headaches and no shortness of breath.       Home Medications Prior to Admission medications   Medication Sig Start Date End Date Taking? Authorizing Provider  antiseptic oral rinse (BIOTENE) LIQD 2 Applications by Mouth Rinse route as needed for dry mouth.   Yes [provider]  arformoterol (BROVANA) 15 MCG/2ML NEBU Substituted for: Brovana Neb Solution Inhale one vial in nebulizer twice a day. Patient taking differently: Take 15 mcg by nebulization in the morning and at bedtime. 02/09/24  Yes Glenford Bayley, NP  budesonide (PULMICORT) 0.5 MG/2ML nebulizer solution Generic for Pulmicort. Inhale one vial in nebulizer twice a day. Rinse mouth after use. Patient taking differently: Take 0.5 mg by nebulization in the morning and at bedtime. 02/09/24  Yes Glenford Bayley, NP  Cholecalciferol (VITAMIN D) 50 MCG (2000 UT) CAPS Take 2,000 Units by mouth daily. Crush and Mix with something   Yes [provider]  Dextromethorphan-guaiFENesin (MUCINEX DM PO) Take  20 mLs by mouth in the morning and at bedtime. Add to pudding or applesauce   Yes [provider]  ipratropium (ATROVENT) 0.03 % nasal spray Place 2 sprays into both nostrils 2 (two) times daily. 10/07/23  Yes Glenford Bayley, NP  Loperamide HCl (IMODIUM PO) Take 30 mLs by mouth as needed.   Yes [provider]  metoprolol tartrate (LOPRESSOR) 25 MG tablet Take 1 tablet (25 mg total) by mouth 2 (two) times daily. 12/01/23  Yes Everrett Coombe, DO  Multiple Vitamin (MULTIVITAMIN) LIQD Take 5 mLs by mouth daily.   Yes [provider]  Olopatadine HCl (PATADAY OP) Place 2 drops into both eyes as needed.   Yes [provider]  OXYGEN Inhale 3 L into the lungs continuous.   Yes [provider]  revefenacin (YUPELRI) 175 MCG/3ML nebulizer solution Inhale one vial in nebulizer once daily. Do not mix with other nebulized medications. Patient taking differently: Take 175 mcg by nebulization daily. 02/09/24  Yes Glenford Bayley, NP  sevelamer carbonate (RENVELA) 0.8 g PACK packet Take 0.8 g by mouth daily as needed (snacks). 10/28/23  Yes [provider]  sevelamer carbonate (RENVELA) 2.4 g PACK Take 2.4 g by mouth 3 (three) times daily with meals.   Yes [provider]  AMBULATORY NON FORMULARY MEDICATION Please provide portable O2 concentrator for patient. Flow 2L/min, 24/7 use.  J96.10-Chronic respiratory failure. 09/12/23   Everrett Coombe, DO  AMBULATORY NON FORMULARY MEDICATION Please provide  portable oxygen concentrator.  Flow 3L/min.  Dx: Chronic respiratory failure J96.11, COPD J42 09/22/23   Everrett Coombe, DO      Allergies    Sulfa antibiotics, Penicillins, and Tape    Review of Systems   Review of Systems  Constitutional:  Negative for fever.  Respiratory:  Negative for shortness of breath.   Cardiovascular:  Negative for chest pain.  Gastrointestinal:  Negative for abdominal pain, nausea and vomiting.  Genitourinary:   Negative for flank pain.  Musculoskeletal:  Negative for back pain and neck pain.  Skin:  Positive for wound.  Neurological:  Negative for weakness, numbness and headaches.  Hematological:  Does not bruise/bleed easily.    Physical Exam Updated Vital Signs BP (!) 204/70 (BP Location: Right Arm)   Pulse 84   Temp 98.7 F (37.1 C)   Resp 17   Ht 1.778 m (5\' 10" )   Wt 71.2 kg   SpO2 98%   BMI 22.53 kg/m  Physical Exam Vitals and nursing note reviewed.  Constitutional:      Appearance: Normal appearance. He is well-developed.  HENT:     Head:     Comments: Minimal contusion right forehead/scalp.     Nose: Nose normal.     Mouth/Throat:     Mouth: Mucous membranes are moist.     Pharynx: Oropharynx is clear.  Eyes:     General: No scleral icterus.    Conjunctiva/sclera: Conjunctivae normal.     Pupils: Pupils are equal, round, and reactive to light.  Neck:     Trachea: No tracheal deviation.  Cardiovascular:     Rate and Rhythm: Normal rate and regular rhythm.     Pulses: Normal pulses.     Heart sounds: Normal heart sounds. No murmur heard.    No friction rub. No gallop.  Pulmonary:     Effort: Pulmonary effort is normal. No accessory muscle usage or respiratory distress.     Breath sounds: Normal breath sounds.  Chest:     Chest wall: No tenderness.  Abdominal:     General: There is no distension.     Palpations: Abdomen is soft.     Tenderness: There is no abdominal tenderness.  Musculoskeletal:        General: No swelling.     Cervical back: Normal range of motion and neck supple. No rigidity or tenderness.     Comments: CTLS spine, non tender, aligned, no step off. Pain right hip w rom. No gross rotational deformity or shortening to RLE. Otherwise good rom bil extremities without other pain or focal bony tenderness. RLE is of normal color and warmth, intact distal pulse. Skin tear RUE and RLE.   Skin:    General: Skin is warm and dry.     Findings: No rash.   Neurological:     Mental Status: He is alert.     Comments: Alert, speech clear. Motor/sens grossly intact bil. RLE nvi.   Psychiatric:        Mood and Affect: Mood normal.     ED Results / Procedures / Treatments   Labs (all labs ordered are listed, but only abnormal results are displayed) Results for orders placed or performed during the hospital encounter of 02/13/24  CBC   Collection Time: 02/13/24  6:21 PM  Result Value Ref Range   WBC 15.1 (H) 4.0 - 10.5 K/uL   RBC 2.74 (L) 4.22 - 5.81 MIL/uL   Hemoglobin 8.9 (L) 13.0 - 17.0 g/dL  HCT 28.1 (L) 39.0 - 52.0 %   MCV 102.6 (H) 80.0 - 100.0 fL   MCH 32.5 26.0 - 34.0 pg   MCHC 31.7 30.0 - 36.0 g/dL   RDW 13.0 86.5 - 78.4 %   Platelets 272 150 - 400 K/uL   nRBC 0.0 0.0 - 0.2 %  Basic metabolic panel with GFR   Collection Time: 02/13/24  6:21 PM  Result Value Ref Range   Sodium 141 135 - 145 mmol/L   Potassium 4.6 3.5 - 5.1 mmol/L   Chloride 98 98 - 111 mmol/L   CO2 29 22 - 32 mmol/L   Glucose, Bld 111 (H) 70 - 99 mg/dL   BUN 42 (H) 8 - 23 mg/dL   Creatinine, Ser 6.96 (H) 0.61 - 1.24 mg/dL   Calcium 8.8 (L) 8.9 - 10.3 mg/dL   GFR, Estimated 8 (L) >60 mL/min   Anion gap 14 5 - 15  Type and screen   Collection Time: 02/13/24  6:40 PM  Result Value Ref Range   ABO/RH(D) O POS    Antibody Screen NEG    Sample Expiration      02/16/2024,2359 Performed at [facility], [address] [place], [place] [address]    CT Hip Right Wo Contrast Result Date: 02/13/2024 CLINICAL DATA:  Right hip pain after fall. EXAM: CT OF THE RIGHT HIP WITHOUT CONTRAST TECHNIQUE: Multidetector CT imaging of the right hip was performed according to the standard protocol. Multiplanar CT image reconstructions were also generated. RADIATION DOSE REDUCTION: This exam was performed according to the departmental dose-optimization program which includes automated exposure control, adjustment of the mA and/or kV according to patient size and/or use  of iterative reconstruction technique. COMPARISON:  Right hip radiographs dated 02/13/2024. FINDINGS: Bones/Joint/Cartilage Minimally impacted subcapital fracture of the right femoral head neck junction. Nondisplaced fracture of the right superior pubic ramus. The right femoral head is seated within the acetabulum. Mild degenerative changes of the right hip. The right sacroiliac joint appears intact. Degenerative changes of the visualized lower lumbar spine. Ligaments Ligaments are suboptimally evaluated by CT. Muscles and Tendons Fullness and heterogeneity of the right iliacus muscle, compatible with intramuscular hematoma. Soft tissue Soft tissue swelling along posterolateral hip. Aortoiliac atherosclerosis. IMPRESSION: 1. Minimally impacted subcapital fracture of the right femoral head and neck junction. 2. Nondisplaced fracture of the right superior pubic ramus. 3. Intramuscular hematoma within the right iliacus muscle. Electronically Signed   By: Hart Robinsons M.D.   On: 02/13/2024 20:09   DG Hip Unilat With Pelvis 2-3 Views Right Result Date: 02/13/2024 CLINICAL DATA:  Right hip pain following a fall today. EXAM: DG HIP (WITH OR WITHOUT PELVIS) 2-3V RIGHT COMPARISON:  None Available. FINDINGS: Diffuse osteopenia. Probable minimally compressed right subcapital femoral neck fracture. Atheromatous arterial calcifications. Small amount of barium retained in distal descending colon diverticula. IMPRESSION: 1. Probable minimally compressed right subcapital femoral neck fracture. Recommend confirmation with a right hip CT without contrast. 2. Diffuse osteopenia. Electronically Signed   By: Beckie Salts M.D.   On: 02/13/2024 15:54   CT Chest High Resolution Result Date: 02/13/2024 CLINICAL DATA:  Cough, chronic/persisting > 8 weeks. Chronic respiratory failure with hypoxia. EXAM: CT CHEST WITHOUT CONTRAST TECHNIQUE: Multidetector CT imaging of the chest was performed following the standard protocol without  intravenous contrast. High resolution imaging of the lungs, as well as inspiratory and expiratory imaging, was performed. RADIATION DOSE REDUCTION: This exam was performed according to the departmental dose-optimization program  which includes automated exposure control, adjustment of the mA and/or kV according to patient size and/or use of iterative reconstruction technique. COMPARISON:  01/14/2024 chest radiograph. FINDINGS: Cardiovascular: Mild cardiomegaly. No significant pericardial effusion/thickening. Three-vessel coronary atherosclerosis. Atherosclerotic nonaneurysmal thoracic aorta. Normal caliber pulmonary arteries. Mediastinum/Nodes: No significant thyroid nodules. Unremarkable esophagus. No pathologically enlarged axillary, mediastinal or hilar lymph nodes, noting limited sensitivity for the detection of hilar adenopathy on this noncontrast study. Lungs/Pleura: No pneumothorax. No pleural effusion. Moderate to severe centrilobular emphysema with diffuse bronchial wall thickening. No acute consolidative airspace disease or lung masses. Two solid scattered right pulmonary nodules, largest 0.4 cm in the right upper lobe on series 8/image 66. Thick curvilinear bandlike foci of consolidation with associated volume loss and distortion in the posterior basilar lower lobes bilaterally, right greater than left. No significant regions of subpleural reticulation, ground-glass opacity, traction bronchiectasis or frank honeycombing. Upper abdomen: No acute abnormality. Musculoskeletal: No aggressive appearing focal osseous lesions. Mild bilateral gynecomastia. Moderate to marked thoracic spondylosis. IMPRESSION: 1. Two solid scattered right pulmonary nodules, largest 0.4 cm in the right upper lobe. Suggest follow-up noncontrast chest CT in 12 months given patient risk factors. 2. No evidence of interstitial lung disease. 3. Thick curvilinear bandlike foci of postinfectious/postinflammatory scarring at the right  greater than left lung bases. 4. Mild cardiomegaly. Three-vessel coronary atherosclerosis. 5. Aortic Atherosclerosis (ICD10-I70.0) and Emphysema (ICD10-J43.9). Electronically Signed   By: Delbert Phenix M.D.   On: 02/13/2024 14:57    EKG None  Radiology CT Hip Right Wo Contrast Result Date: 02/13/2024 CLINICAL DATA:  Right hip pain after fall. EXAM: CT OF THE RIGHT HIP WITHOUT CONTRAST TECHNIQUE: Multidetector CT imaging of the right hip was performed according to the standard protocol. Multiplanar CT image reconstructions were also generated. RADIATION DOSE REDUCTION: This exam was performed according to the departmental dose-optimization program which includes automated exposure control, adjustment of the mA and/or kV according to patient size and/or use of iterative reconstruction technique. COMPARISON:  Right hip radiographs dated 02/13/2024. FINDINGS: Bones/Joint/Cartilage Minimally impacted subcapital fracture of the right femoral head neck junction. Nondisplaced fracture of the right superior pubic ramus. The right femoral head is seated within the acetabulum. Mild degenerative changes of the right hip. The right sacroiliac joint appears intact. Degenerative changes of the visualized lower lumbar spine. Ligaments Ligaments are suboptimally evaluated by CT. Muscles and Tendons Fullness and heterogeneity of the right iliacus muscle, compatible with intramuscular hematoma. Soft tissue Soft tissue swelling along posterolateral hip. Aortoiliac atherosclerosis. IMPRESSION: 1. Minimally impacted subcapital fracture of the right femoral head and neck junction. 2. Nondisplaced fracture of the right superior pubic ramus. 3. Intramuscular hematoma within the right iliacus muscle. Electronically Signed   By: Hart Robinsons M.D.   On: 02/13/2024 20:09   DG Hip Unilat With Pelvis 2-3 Views Right Result Date: 02/13/2024 CLINICAL DATA:  Right hip pain following a fall today. EXAM: DG HIP (WITH OR WITHOUT PELVIS)  2-3V RIGHT COMPARISON:  None Available. FINDINGS: Diffuse osteopenia. Probable minimally compressed right subcapital femoral neck fracture. Atheromatous arterial calcifications. Small amount of barium retained in distal descending colon diverticula. IMPRESSION: 1. Probable minimally compressed right subcapital femoral neck fracture. Recommend confirmation with a right hip CT without contrast. 2. Diffuse osteopenia. Electronically Signed   By: Beckie Salts M.D.   On: 02/13/2024 15:54    Procedures Procedures    Medications Ordered in ED Medications  morphine (PF) 4 MG/ML injection 4 mg (4 mg Intravenous Given 02/13/24 1853)  ondansetron (ZOFRAN)  injection 4 mg (4 mg Intravenous Given 02/13/24 1851)    ED Course/ Medical Decision Making/ A&P                                 Medical Decision Making Problems Addressed: Chronic obstructive pulmonary disease, unspecified COPD type [facility]): chronic illness or injury with exacerbation, progression, or side effects of treatment that poses a threat to life or bodily functions Chronic respiratory failure with hypoxia [facility]): chronic illness or injury with exacerbation, progression, or side effects of treatment that poses a threat to life or bodily functions Closed fracture of superior ramus of right pubis, initial encounter [facility]): acute illness or injury Elevated blood pressure reading: acute illness or injury 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 illness or injury with exacerbation, progression, or side effects of treatment that poses a threat to life or bodily functions Essential hypertension: chronic illness or injury that poses a threat to life or bodily functions Fall from slip, trip, or stumble, initial encounter: acute illness or injury with systemic symptoms that poses a threat to life or bodily functions Multiple skin tears: acute illness or injury Nondisplaced fracture of base of neck of right femur, initial encounter for closed fracture [facility]): acute  illness or injury with systemic symptoms that poses a threat to life or bodily functions  Amount and/or Complexity of Data Reviewed Independent Historian: spouse    Details: hx External Data Reviewed: notes. Labs: ordered. Decision-making details documented in ED Course. Radiology: ordered and independent interpretation performed. Decision-making details documented in ED Course. ECG/medicine tests: ordered and independent interpretation performed. Decision-making details documented in ED Course. Discussion of management or test interpretation with external provider(s): Orthopedics, medicine, nephrology  Risk Prescription drug management. Parenteral controlled substances. Decision regarding hospitalization.   Iv ns. Continuous pulse ox and cardiac monitoring. Labs ordered/sent. Imaging reviewed   Differential diagnosis includes right hip fx, contusion, pubic rami fx, etc . Dispo decision including potential need for admission considered - will get labs and review imaging and reassess.   Reviewed nursing notes and prior charts for additional history. External reports reviewed. Additional history from: family.  Morphine iv. Zofran iv.   Cardiac monitor: sinus rhythm, rate 80.  Labs reviewed/interpreted by me - wbc 15, hgb 9. Ckd. K normal.   Xrays reviewed/interpreted by me - +impacted fem neck fx.   Orthopedics consulted, discussed pt, imaging with Dr Steward Drone - he requests CT hip, he will see in consult, requests medicine admit, npo post mn tonight.   Medicine consulted for admission. Discussed pt with hospitalists - will admit.   Pain improved on recheck.   Nephrology consulted - discussed pt, hd pt, etc, - they will follow/consult and facilitate hd.           Final Clinical Impression(s) / ED Diagnoses Final diagnoses:  Fall from slip, trip, or stumble, initial encounter  Nondisplaced fracture of base of neck of right femur, initial encounter for closed fracture  (HCC)  Multiple skin tears  Elevated blood pressure reading  Essential hypertension  Chronic obstructive pulmonary disease, unspecified COPD type (HCC)  Chronic respiratory failure with hypoxia [facility])    Rx / DC Orders ED Discharge Orders     None         Cathren Laine, MD 02/13/24 2043

## 2024-02-13 NOTE — Discharge Instructions (Signed)
 Go to the emergency department for further evaluation and treatment.

## 2024-02-13 NOTE — Progress Notes (Signed)
 Please let patient know CT showed moderate-severe emphysema. No evidence of interstitial lung disease or pulmonary fibrosis. He had some bandlike area of scarring R>L lung base.  Two small right pulmonary nodules, needs follow-up CT chest in 1 year (please order CT chest wo contrast).

## 2024-02-13 NOTE — ED Triage Notes (Addendum)
 Pt presents with complaints of fall today at 1:10 PM. [place] Pt is on 3 liters oxygen at baseline and does dialysis three times a week. Pt has wound to right lower extremity and right upper arm, applied neosporin + wrapped in guaze PTA. Pt currently rates his overall pain a 5/10. Pt states he is able to bear weight on the lower extremity. Pt did hit right side of head on fall. Denies LOC.

## 2024-02-13 NOTE — ED Notes (Signed)
 Patient is being discharged from the Urgent Care and sent to the Emergency Department via private vehicle (caregiver + spouse refused EMS transport). Per Marylene Land PA, patient is in need of higher level of care due to fall & head injury reported. Patient is aware and verbalizes understanding of plan of care.  Vitals:   02/13/24 1457 02/13/24 1459  BP: (!) 188/65   Pulse: 75   Resp: 20   Temp: 98.1 F (36.7 C)   SpO2: 92% 92%

## 2024-02-13 NOTE — ED Provider Notes (Signed)
 [name]    CSN: [id number] Arrival date & time: 02/13/24  1418      History   Chief Complaint Chief Complaint  Patient presents with   Fall    HPI Jeremy [name] is a 83 y.o. male.   The history is provided by the patient, a parent and a caregiver.  Fall  Larey Seat this [place] tripped over a rug fell landing on right side sustaining a skin tear to right proximal lower leg and right shoulder, also hit right hip and head.  Witnessed by caregiver denies loss of consciousness.  Admits pain in the right hip worse with weightbearing. Vision, neck or back pain, chest pain or shortness of breath, nausea or vomiting, new paresthesias or weakness.  Chart was reviewed last tetanus was 2022  Past Medical History:  Diagnosis Date   Anemia    Cancer [facility])    patient and family have not been told that he has cancer.   Chronic kidney disease    dialysis T/Th/Sa   COPD (chronic obstructive pulmonary disease) (HCC)    Enlarged prostate    History of blood transfusion    Hyperlipidemia    Hypertension    Self-catheterizes urinary bladder    pt. does this morning and evening--been doing this since 11/2016   Sleep apnea    not using a cpap machine    Patient Active Problem List   Diagnosis Date Noted   Malnutrition of moderate degree 08/13/2023   Hypoxia 08/12/2023   Chronic respiratory failure with hypoxia (HCC) 08/12/2023   Other disorders of phosphorus metabolism 06/25/2023   Malignant neoplasm of bladder, unspecified (HCC) 01/06/2023   ESRD (end stage renal disease) (HCC) 10/24/2022   Gait instability 10/24/2022   Neck pain on right side 04/23/2022   Intermediate stage nonexudative age-related macular degeneration of left eye 08/17/2020   Pseudophakia 08/17/2020   Degenerative retinal drusen of left eye 08/17/2020   Advanced nonexudative age-related macular degeneration of right eye with subfoveal  involvement 08/17/2020   Senile purpura (HCC) 08/10/2020   Aortic atherosclerosis (HCC) 08/10/2020   Cerumen impaction 08/10/2020   Skin lesion 07/06/2019   Hyperlipidemia 01/05/2019   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   Cigarette smoker 03/17/2018   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   OSA (obstructive sleep apnea) 05/23/2016   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   Osteoarthritis 01/29/2010   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   History of bladder cancer 10/10/2008   Chronic bronchitis (HCC) 10/10/2008   Diverticulosis of large intestine 10/10/2008   COLONIC POLYPS 12/07/2007   Essential hypertension 12/07/2007   Hemorrhoids 12/07/2007   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    Past Surgical History:  Procedure Laterality Date   A/V FISTULAGRAM Left 03/12/2023   Procedure: A/V Fistulagram;  Surgeon: Victorino Sparrow, MD;  Location: [facility] INVASIVE CV LAB;  Service: Cardiovascular;  Laterality: Left;   A/V FISTULAGRAM Left 10/31/2023   Procedure: A/V Fistulagram;  Surgeon: Ethelene Hal, MD;  Location: [facility] INVASIVE CV LAB;  Service: Cardiovascular;  Laterality: Left;   AV FISTULA PLACEMENT Left 10/16/2022   Procedure: LEFT BRACHIOCEPHALIC ARTERIOVENOUS (AV) FISTULA CREATION;  Surgeon: Cephus Shelling, MD;  Location: [facility] OR;  Service: Vascular;  Laterality: Left;   BACK SURGERY     BLADDER TUMOR EXCISION  2015   benign   BLEPHAROPLASTY  12/2018  LIGATION OF COMPETING BRANCHES OF ARTERIOVENOUS FISTULA Left 03/17/2023   Procedure: LEFT ARM FISTULA BRANCH LIGATION;  Surgeon: Victorino Sparrow, MD;  Location: [facility] OR;  Service: Vascular;  Laterality: Left;   LUMBAR LAMINECTOMY/DECOMPRESSION MICRODISCECTOMY Bilateral 08/25/2017   Procedure: Bilateral Lumbar Three- Four, Lumbar Four- Five, Lumbar Five- Sacral One Laminectomy;  Surgeon: Barnett Abu, MD;  Location: [facility] OR;  Service: Neurosurgery;  Laterality:  Bilateral;  Bilateral L3-4 L4-5 L5-S1 Laminectomy   SKIN CANCER EXCISION     Surg x2...   TONSILLECTOMY AND ADENOIDECTOMY     Surg as a child...   TRANSURETHRAL RESECTION OF BLADDER TUMOR N/A 01/03/2022   Procedure: TRANSURETHRAL RESECTION OF BLADDER TUMOR (TURBT)/ CYSTOSCOPY/ EXAM UNDER ANESTHESIA, BILATERAL RETROGRADE/ BLADDER BIOPSIES;  Surgeon: Heloise Purpura, MD;  Location: [facility] ORS;  Service: Urology;  Laterality: N/A;  GENERAL ANESTHESIA WITH PARALYSIS       Home Medications    Prior to Admission medications   Medication Sig Start Date End Date Taking? Authorizing Provider  AMBULATORY NON FORMULARY MEDICATION Please provide portable O2 concentrator for patient. Flow 2L/min, 24/7 use.  J96.10-Chronic respiratory failure. 09/12/23   Everrett Coombe, DO  AMBULATORY NON FORMULARY MEDICATION Please provide portable oxygen concentrator.  Flow 3L/min.  Dx: Chronic respiratory failure J96.11, COPD J42 09/22/23   Everrett Coombe, DO  arformoterol [facility]) 15 MCG/2ML NEBU Substituted for: Brovana Neb Solution Inhale one vial in nebulizer twice a day. 02/09/24   Glenford Bayley, NP  budesonide (PULMICORT) 0.5 MG/2ML nebulizer solution Generic for Pulmicort. Inhale one vial in nebulizer twice a day. Rinse mouth after use. 02/09/24   Glenford Bayley, NP  carbamide peroxide (DEBROX) 6.5 % OTIC solution Place 5 drops into both ears 2 (two) times daily. 01/23/24   Garrison, [place] N, FNP  Cholecalciferol (VITAMIN D) 50 MCG (2000 UT) CAPS Take 2,000 Units by mouth daily.    [provider]  docusate (COLACE) 50 MG/5ML liquid Take 10 mLs (100 mg total) by mouth 2 (two) times daily. Patient not taking: Reported on 01/14/2024 08/19/23   Regalado, Jon Billings A, MD  Ensifentrine (OHTUVAYRE) 3 MG/2.5ML SUSP Inhale 2.5 mLs into the lungs 2 (two) times daily. 01/14/24   Cobb, Ruby Cola, NP  fluticasone (FLONASE) 50 MCG/ACT nasal spray Place 1 spray into both nostrils daily as needed for allergies or  rhinitis.    [provider]  guaiFENesin (ROBITUSSIN) 100 MG/5ML liquid Take 20 mLs by mouth 2 (two) times daily. Patient taking differently: Take 20 mLs by mouth 2 (two) times daily. Dm Max 08/19/23   Regalado, Belkys A, MD  ipratropium (ATROVENT) 0.03 % nasal spray Place 2 sprays into both nostrils 2 (two) times daily. 10/07/23   Glenford Bayley, NP  metoprolol tartrate (LOPRESSOR) 25 MG tablet Take 1 tablet (25 mg total) by mouth 2 (two) times daily. 12/01/23   Everrett Coombe, DO  Multiple Vitamin (MULTIVITAMIN) LIQD Take 5 mLs by mouth daily.    [provider]  OXYGEN Inhale 3 L into the lungs continuous.    [provider]  polyethylene glycol (MIRALAX / GLYCOLAX) 17 g packet Take 17 g by mouth daily. Patient not taking: Reported on 01/14/2024 08/19/23   Regalado, Jon Billings A, MD  revefenacin (YUPELRI) 175 MCG/3ML nebulizer solution Inhale one vial in nebulizer once daily. Do not mix with other nebulized medications. 02/09/24   Glenford Bayley, NP  sevelamer carbonate (RENVELA) 0.8 g PACK packet Take 0.8 g by mouth daily as needed (snacks). 10/28/23  [provider]  sevelamer carbonate (RENVELA) 2.4 g PACK Take 2.4 g by mouth 3 (three) times daily with meals.    [provider]    Family History Family History  Problem Relation Age of Onset   Colon cancer Neg Hx    Stomach cancer Neg Hx    Rectal cancer Neg Hx     Social History Social History   Tobacco Use   Smoking status: Former    Current packs/day: 0.00    Average packs/day: 0.2 packs/day for 61.5 years (12.3 ttl pk-yrs)    Types: Cigarettes    Start date: 12/29/1961    Quit date: 07/2023    Years since quitting: 0.5    Passive exposure: Never   Smokeless tobacco: Never   Tobacco comments:    Smokes 1/2 pack a day     Pt states he quit smoking 2 months ago. AB, CMA 10-07-23  Vaping Use   Vaping status: Never Used  Substance Use Topics   Alcohol use: Not Currently     Comment: couple glasses with dinner daily   Drug use: No     Allergies   Sulfa antibiotics, Penicillins, and Tape   Review of Systems Review of Systems   Physical Exam Triage Vital Signs ED Triage Vitals  Encounter Vitals Group     BP 02/13/24 1457 (!) 188/65     Systolic BP Percentile --      Diastolic BP Percentile --      Pulse Rate 02/13/24 1457 75     Resp 02/13/24 1457 20     Temp 02/13/24 1457 98.1 F (36.7 C)     Temp Source 02/13/24 1457 Temporal     SpO2 02/13/24 1457 92 %     Weight 02/13/24 1513 160 lb 7.9 oz (72.8 kg)     Height --      Head Circumference --      Peak Flow --      Pain Score 02/13/24 1458 5     Pain Loc --      Pain Education --      Exclude from Growth Chart --    No data found.  Updated Vital Signs BP (!) 188/65 (BP Location: Right Arm)   Pulse 75   Temp 98.1 F (36.7 C) (Temporal)   Resp 20   Wt 160 lb 7.9 oz (72.8 kg)   SpO2 92% Comment: 3L at baseline.  BMI 24.05 kg/m   Visual Acuity Right Eye Distance:   Left Eye Distance:   Bilateral Distance:    Right Eye Near:   Left Eye Near:    Bilateral Near:     Physical Exam Vitals and nursing note reviewed.  HENT:     Head: Normocephalic.  Eyes:     Conjunctiva/sclera: Conjunctivae normal.  Skin:    Comments: Superficial 3 cm skin tear right upper arm no active bleeding.  Fishel skin tear right proximal lower leg 2.5 cm no active bleeding  Neurological:     Mental Status: He is alert and oriented to person, place, and time.      UC Treatments / Results  Labs (all labs ordered are listed, but only abnormal results are displayed) Labs Reviewed - No data to display  EKG   Radiology DG Hip Unilat With Pelvis 2-3 Views Right Result Date: 02/13/2024 CLINICAL DATA:  Right hip pain following a fall today. EXAM: DG HIP (WITH OR WITHOUT PELVIS) 2-3V RIGHT COMPARISON:  None Available. FINDINGS:  Diffuse osteopenia. Probable minimally compressed right subcapital femoral  neck fracture. Atheromatous arterial calcifications. Small amount of barium retained in distal descending colon diverticula. IMPRESSION: 1. Probable minimally compressed right subcapital femoral neck fracture. Recommend confirmation with a right hip CT without contrast. 2. Diffuse osteopenia. Electronically Signed   By: Beckie Salts M.D.   On: 02/13/2024 15:54    Procedures Procedures (including critical care time)  Medications Ordered in UC Medications - No data to display  Initial Impression / Assessment and Plan / UC Course  I have reviewed the triage vital signs and the nursing notes.  Pertinent labs & imaging results that were available during my care of the patient were reviewed by me and considered in my medical decision making (see chart for details).     I spoke with patient's wife upon arrival and recommended ED evaluation she declined states they do not want to wait in the ED would prefer to be evaluated first, she denied head injury at that time.  Patient admitted hitting his head on the floor.  Discussed with patient family and caregiver should have ED evaluation due to hitting his head.  Skin tears right lower leg right arm were cleaned and bandaged Office and hip x-rays independently viewed by me suspect hip fracture right femoral head patient counseled to go to emergency department now for further evaluation and treatment as he will likely need CT of head and right hip  Final Clinical Impressions(s) / UC Diagnoses   Final diagnoses:  Fall, initial encounter  Multiple skin tears  Contusion of scalp, initial encounter  Contusion of right hip, initial encounter     Discharge Instructions      Go to the emergency department for further evaluation and treatment     ED Prescriptions   None    PDMP not reviewed this encounter.   Meliton Rattan, [place] 02/13/24 1601

## 2024-02-13 NOTE — ED Triage Notes (Signed)
 Pt tripped over rug and had a mechanical fall. Pt hit head, no LOC. C/O right hip pain. Pt has laceration to right upper arm, bleeding controlled. No blood thinners. Axox4.

## 2024-02-14 ENCOUNTER — Inpatient Hospital Stay (HOSPITAL_COMMUNITY): Admitting: Certified Registered Nurse Anesthetist

## 2024-02-14 ENCOUNTER — Other Ambulatory Visit: Payer: Self-pay

## 2024-02-14 ENCOUNTER — Encounter (HOSPITAL_COMMUNITY): Payer: Self-pay | Admitting: Internal Medicine

## 2024-02-14 ENCOUNTER — Encounter (HOSPITAL_COMMUNITY)
Admission: EM | Disposition: A | Discharge status: Skilled Nursing Facility | Payer: Self-pay | Source: Ambulatory Visit | Attending: Internal Medicine

## 2024-02-14 ENCOUNTER — Inpatient Hospital Stay (HOSPITAL_COMMUNITY)

## 2024-02-14 DIAGNOSIS — S72001A Fracture of unspecified part of neck of right femur, initial encounter for closed fracture: Secondary | ICD-10-CM | POA: Diagnosis not present

## 2024-02-14 DIAGNOSIS — G4733 Obstructive sleep apnea (adult) (pediatric): Secondary | ICD-10-CM

## 2024-02-14 DIAGNOSIS — J42 Unspecified chronic bronchitis: Secondary | ICD-10-CM

## 2024-02-14 DIAGNOSIS — Z992 Dependence on renal dialysis: Secondary | ICD-10-CM | POA: Diagnosis not present

## 2024-02-14 DIAGNOSIS — N186 End stage renal disease: Secondary | ICD-10-CM | POA: Diagnosis not present

## 2024-02-14 DIAGNOSIS — S7291XA Unspecified fracture of right femur, initial encounter for closed fracture: Secondary | ICD-10-CM | POA: Diagnosis not present

## 2024-02-14 DIAGNOSIS — J9610 Chronic respiratory failure, unspecified whether with hypoxia or hypercapnia: Secondary | ICD-10-CM | POA: Diagnosis not present

## 2024-02-14 DIAGNOSIS — Z87891 Personal history of nicotine dependence: Secondary | ICD-10-CM | POA: Diagnosis not present

## 2024-02-14 DIAGNOSIS — I120 Hypertensive chronic kidney disease with stage 5 chronic kidney disease or end stage renal disease: Secondary | ICD-10-CM | POA: Diagnosis not present

## 2024-02-14 DIAGNOSIS — J449 Chronic obstructive pulmonary disease, unspecified: Secondary | ICD-10-CM

## 2024-02-14 DIAGNOSIS — I10 Essential (primary) hypertension: Secondary | ICD-10-CM

## 2024-02-14 HISTORY — PX: ORIF HIP FRACTURE: SHX2125

## 2024-02-14 LAB — RENAL FUNCTION PANEL
Albumin: 3.1 g/dL — ABNORMAL LOW (ref 3.5–5.0)
Anion gap: 14 (ref 5–15)
BUN: 50 mg/dL — ABNORMAL HIGH (ref 8–23)
CO2: 29 mmol/L (ref 22–32)
Calcium: 8.6 mg/dL — ABNORMAL LOW (ref 8.9–10.3)
Chloride: 99 mmol/L (ref 98–111)
Creatinine, Ser: 7.32 mg/dL — ABNORMAL HIGH (ref 0.61–1.24)
GFR, Estimated: 7 mL/min — ABNORMAL LOW (ref >60)
Glucose, Bld: 91 mg/dL (ref 70–99)
Phosphorus: 7.1 mg/dL — ABNORMAL HIGH (ref 2.5–4.6)
Potassium: 5.1 mmol/L (ref 3.5–5.1)
Sodium: 142 mmol/L (ref 135–145)

## 2024-02-14 LAB — CBC
HCT: 24.4 % — ABNORMAL LOW (ref 39.0–52.0)
Hemoglobin: 7.6 g/dL — ABNORMAL LOW (ref 13.0–17.0)
MCH: 31.7 pg (ref 26.0–34.0)
MCHC: 31.1 g/dL (ref 30.0–36.0)
MCV: 101.7 fL — ABNORMAL HIGH (ref 80.0–100.0)
Platelets: 248 10*3/uL (ref 150–400)
RBC: 2.4 MIL/uL — ABNORMAL LOW (ref 4.22–5.81)
RDW: 12.9 % (ref 11.5–15.5)
WBC: 10.1 10*3/uL (ref 4.0–10.5)
nRBC: 0 % (ref 0.0–0.2)

## 2024-02-14 LAB — HEPATITIS B SURFACE ANTIGEN: Hepatitis B Surface Ag: NONREACTIVE

## 2024-02-14 LAB — SURGICAL PCR SCREEN
MRSA, PCR: NEGATIVE
Staphylococcus aureus: NEGATIVE

## 2024-02-14 LAB — MAGNESIUM: Magnesium: 2.2 mg/dL (ref 1.7–2.4)

## 2024-02-14 LAB — PROTIME-INR
INR: 1.2 (ref 0.8–1.2)
Prothrombin Time: 15.3 s — ABNORMAL HIGH (ref 11.4–15.2)

## 2024-02-14 SURGERY — OPEN REDUCTION INTERNAL FIXATION HIP
Anesthesia: Spinal | Site: Hip | Laterality: Right

## 2024-02-14 MED ORDER — FENTANYL CITRATE (PF) 250 MCG/5ML IJ SOLN
INTRAMUSCULAR | Status: AC
  Filled 2024-02-14: qty 5

## 2024-02-14 MED ORDER — ROCURONIUM BROMIDE 10 MG/ML (PF) SYRINGE
PREFILLED_SYRINGE | INTRAVENOUS | Status: AC
  Filled 2024-02-14: qty 10

## 2024-02-14 MED ORDER — ONDANSETRON HCL 4 MG/2ML IJ SOLN
INTRAMUSCULAR | Status: AC
  Filled 2024-02-14: qty 2

## 2024-02-14 MED ORDER — 0.9 % SODIUM CHLORIDE (POUR BTL) OPTIME
TOPICAL | Status: DC | PRN
  Administered 2024-02-14: 100 mL

## 2024-02-14 MED ORDER — PROPOFOL 10 MG/ML IV BOLUS
INTRAVENOUS | Status: AC
  Filled 2024-02-14: qty 20

## 2024-02-14 MED ORDER — ONDANSETRON HCL 4 MG/2ML IJ SOLN
4.0000 mg | Freq: Once | INTRAMUSCULAR | Status: AC
Start: 2024-02-14 — End: 2024-02-14
  Administered 2024-02-14: 4 mg via INTRAVENOUS

## 2024-02-14 MED ORDER — PHENYLEPHRINE 80 MCG/ML (10ML) SYRINGE FOR IV PUSH (FOR BLOOD PRESSURE SUPPORT)
PREFILLED_SYRINGE | INTRAVENOUS | Status: AC
  Filled 2024-02-14: qty 10

## 2024-02-14 MED ORDER — CEFAZOLIN SODIUM-DEXTROSE 2-4 GM/100ML-% IV SOLN: 2.0000 g | Freq: Three times a day (TID) | INTRAVENOUS | Status: DC

## 2024-02-14 MED ORDER — CEFAZOLIN SODIUM-DEXTROSE 2-4 GM/100ML-% IV SOLN
2.0000 g | INTRAVENOUS | Status: AC
  Administered 2024-02-14: 2 g via INTRAVENOUS

## 2024-02-14 MED ORDER — CEFAZOLIN SODIUM-DEXTROSE 2-4 GM/100ML-% IV SOLN
INTRAVENOUS | Status: AC
Start: 2024-02-14 — End: 2024-02-14
  Filled 2024-02-14: qty 100

## 2024-02-14 MED ORDER — ASPIRIN 325 MG PO TABS
325.0000 mg | ORAL_TABLET | Freq: Every day | ORAL | Status: DC
  Administered 2024-02-15 – 2024-02-23 (×9): 325 mg via ORAL
  Filled 2024-02-14 (×9): qty 1

## 2024-02-14 MED ORDER — PHENYLEPHRINE HCL-NACL 20-0.9 MG/250ML-% IV SOLN
INTRAVENOUS | Status: DC | PRN
  Administered 2024-02-14: 30 ug/min via INTRAVENOUS

## 2024-02-14 MED ORDER — CHLORHEXIDINE GLUCONATE CLOTH 2 % EX PADS
6.0000 | MEDICATED_PAD | Freq: Every day | CUTANEOUS | Status: DC
  Administered 2024-02-15 – 2024-02-17 (×3): 6 via TOPICAL

## 2024-02-14 MED ORDER — TRANEXAMIC ACID-NACL 1000-0.7 MG/100ML-% IV SOLN
1000.0000 mg | INTRAVENOUS | Status: AC
  Administered 2024-02-14: 1000 mg via INTRAVENOUS

## 2024-02-14 MED ORDER — LIDOCAINE 2% (20 MG/ML) 5 ML SYRINGE
INTRAMUSCULAR | Status: AC
  Filled 2024-02-14: qty 5

## 2024-02-14 MED ORDER — CHLORHEXIDINE GLUCONATE 4 % EX SOLN: 60.0000 mL | Freq: Once | CUTANEOUS | Status: DC

## 2024-02-14 MED ORDER — CHLORHEXIDINE GLUCONATE 0.12 % MT SOLN
OROMUCOSAL | Status: AC
  Filled 2024-02-14: qty 15

## 2024-02-14 MED ORDER — SODIUM CHLORIDE 0.9 % IV SOLN: INTRAVENOUS | Status: DC | PRN

## 2024-02-14 MED ORDER — PHENYLEPHRINE 80 MCG/ML (10ML) SYRINGE FOR IV PUSH (FOR BLOOD PRESSURE SUPPORT)
PREFILLED_SYRINGE | INTRAVENOUS | Status: DC | PRN
  Administered 2024-02-14: 80 ug via INTRAVENOUS

## 2024-02-14 MED ORDER — CHLORHEXIDINE GLUCONATE 0.12 % MT SOLN
15.0000 mL | Freq: Once | OROMUCOSAL | Status: AC
Start: 2024-02-14 — End: 2024-02-14
  Administered 2024-02-14: 15 mL via OROMUCOSAL

## 2024-02-14 MED ORDER — PROPOFOL 10 MG/ML IV BOLUS
INTRAVENOUS | Status: DC | PRN
  Administered 2024-02-14: 25 ug/kg/min via INTRAVENOUS

## 2024-02-14 MED ORDER — BUPIVACAINE IN DEXTROSE 0.75-8.25 % IT SOLN
INTRATHECAL | Status: DC | PRN
  Administered 2024-02-14: 1.6 mL via INTRATHECAL

## 2024-02-14 MED ORDER — TRANEXAMIC ACID-NACL 1000-0.7 MG/100ML-% IV SOLN
INTRAVENOUS | Status: AC
  Filled 2024-02-14: qty 100

## 2024-02-14 SURGICAL SUPPLY — 30 items
BAG COUNTER SPONGE SURGICOUNT (BAG) ×1 IMPLANT
BIT DRILL CANNULATED 5.0 (BIT) IMPLANT
COVER PERINEAL POST (MISCELLANEOUS) ×1 IMPLANT
COVER SURGICAL LIGHT HANDLE (MISCELLANEOUS) ×2 IMPLANT
DRAPE STERI IOBAN 125X83 (DRAPES) ×1 IMPLANT
DRESSING MEPILEX FLEX 4X4 (GAUZE/BANDAGES/DRESSINGS) ×1 IMPLANT
DRSG MEPILEX FLEX 4X4 (GAUZE/BANDAGES/DRESSINGS) ×1 IMPLANT
DRSG MEPILEX POST OP 4X8 (GAUZE/BANDAGES/DRESSINGS) ×1 IMPLANT
ELECT REM PT RETURN 9FT ADLT (ELECTROSURGICAL) ×1 IMPLANT
ELECTRODE REM PT RTRN 9FT ADLT (ELECTROSURGICAL) ×1 IMPLANT
GLOVE BIO SURGEON STRL SZ 6 (GLOVE) IMPLANT
GLOVE BIO SURGEON STRL SZ7.5 (GLOVE) IMPLANT
GLOVE BIOGEL PI IND STRL 6.5 (GLOVE) IMPLANT
GLOVE BIOGEL PI IND STRL 8 (GLOVE) IMPLANT
GOWN STRL REUS W/ TWL LRG LVL3 (GOWN DISPOSABLE) IMPLANT
GOWN STRL REUS W/ TWL XL LVL3 (GOWN DISPOSABLE) ×3 IMPLANT
GUIDE PIN ORTH 12X3.2X (PIN) IMPLANT
KIT BASIN OR (CUSTOM PROCEDURE TRAY) ×1 IMPLANT
KIT TURNOVER KIT B (KITS) ×1 IMPLANT
NS IRRIG 1000ML POUR BTL (IV SOLUTION) ×1 IMPLANT
PACK GENERAL/GYN (CUSTOM PROCEDURE TRAY) ×1 IMPLANT
PAD ARMBOARD POSITIONER FOAM (MISCELLANEOUS) ×2 IMPLANT
SCREW CANN 16MM THD 90X7.0 (Screw) IMPLANT
SCREW CANN RVRS CUT FLT 85X16X (Screw) IMPLANT
STAPLER VISISTAT 35W (STAPLE) ×1 IMPLANT
SUT VIC AB 0 CT1 27XBRD ANBCTR (SUTURE) IMPLANT
SUT VIC AB 1 CTB1 27 (SUTURE) ×1 IMPLANT
SUT VIC AB 2-0 CTB1 (SUTURE) ×1 IMPLANT
TOWEL GREEN STERILE (TOWEL DISPOSABLE) ×1 IMPLANT
WATER STERILE IRR 1000ML POUR (IV SOLUTION) ×1 IMPLANT

## 2024-02-14 NOTE — Progress Notes (Signed)
   02/14/24 1908  Vitals  Temp 98 F (36.7 C)  Pulse Rate 90  Resp 17  BP (!) 181/59  SpO2 95 %  Weight 72 kg  Type of Weight Post-Dialysis  Oxygen Therapy  Patient Activity (if Appropriate) In bed  Post Treatment  Dialyzer Clearance Lightly streaked  Hemodialysis Intake (mL) 0 mL  Liters Processed 77.9  Fluid Removed (mL) 0 mL  Tolerated HD Treatment Yes   Received patient in bed to unit.  Alert and oriented.  Informed consent signed and in chart.   TX duration: Three hours and fifteen minutes  Patient tolerated well.  Transported back to the room  Alert, without acute distress.  Hand-off given to patient's nurse.   Access used: Left upper arm fistula Access issues: none  Medication(s) given: Oxycodone 5mg , dilaudid 0.5mg

## 2024-02-14 NOTE — Anesthesia Procedure Notes (Signed)
 Spinal  Patient location during procedure: OR Start time: 02/14/2024 9:40 AM End time: 02/14/2024 9:47 AM Reason for block: surgical anesthesia Staffing Performed: anesthesiologist  Anesthesiologist: Marcene Duos, MD Performed by: Marcene Duos, MD Authorized by: Marcene Duos, MD   Preanesthetic Checklist Completed: patient identified, IV checked, site marked, risks and benefits discussed, surgical consent, monitors and equipment checked, pre-op evaluation and timeout performed Spinal Block Patient position: sitting Prep: DuraPrep Patient monitoring: heart rate, cardiac monitor, continuous pulse ox and blood pressure Approach: right paramedian Location: L3-4 Injection technique: single-shot Needle Needle type: Quincke  Needle gauge: 22 G Needle length: 9 cm Assessment Sensory level: T4 Events: CSF return Additional Notes Midline approach attempted x3 without success. First pass via right paramedian approach with 22g Quincke with CSF return and aspiration before and after injection LA.

## 2024-02-14 NOTE — Plan of Care (Signed)
  Problem: Education: Goal: Knowledge of General Education information will improve Description: Including pain rating scale, medication(s)/side effects and non-pharmacologic comfort measures Outcome: Progressing   Problem: Health Behavior/Discharge Planning: Goal: Ability to manage health-related needs will improve Outcome: Progressing   Problem: Activity: Goal: Risk for activity intolerance will decrease Outcome: Progressing   Problem: Pain Managment: Goal: General experience of comfort will improve and/or be controlled Outcome: Progressing   Problem: Safety: Goal: Ability to remain free from injury will improve Outcome: Progressing   Problem: Skin Integrity: Goal: Risk for impaired skin integrity will decrease Outcome: Progressing

## 2024-02-14 NOTE — Progress Notes (Signed)
 PHARMACY NOTE:  ANTIMICROBIAL RENAL DOSAGE ADJUSTMENT  Current antimicrobial regimen includes a mismatch between antimicrobial dosage and estimated renal function.  As per policy approved by the Pharmacy & Therapeutics and Medical Executive Committees, the antimicrobial dosage will be adjusted accordingly.  Current antimicrobial dosage:  Ancef 2gm IV q8h x 2 doses   Indication: post-op prophylaxis  Renal Function:  Estimated Creatinine Clearance: 7.7 mL/min (A) (by C-G formula based on SCr of 7.32 mg/dL (H)). [x]      On intermittent HD, scheduled: TTS []      On CRRT    Antimicrobial dosage has been changed to:  None - no additional abx needed given ESRD.  Pt received Ancef 2gm IV x 1 pre-op which should be sufficient coverage.    Thank you for allowing pharmacy to be a part of this patient's care.  Brandon Melnick, [facility] 02/14/2024 12:40 PM

## 2024-02-14 NOTE — Transfer of Care (Signed)
 Immediate Anesthesia Transfer of Care Note  Patient: Jeremy [name]  Procedure(s) Performed: OPEN REDUCTION INTERNAL FIXATION HIP (Right: Hip)  Patient Location: PACU  Anesthesia Type:Spinal  Level of Consciousness: awake, alert , and oriented  Airway & Oxygen Therapy: Patient Spontanous Breathing and Patient connected to nasal cannula oxygen  Post-op Assessment: Report given to RN, Post -op Vital signs reviewed and stable, and Patient able to stick tongue midline  Post vital signs: Reviewed and stable  Last Vitals:  Vitals Value Taken Time  BP 119/57 02/14/24 1035  Temp 36.4 C 02/14/24 1030  Pulse 84 02/14/24 1040  Resp 17 02/14/24 1040  SpO2 94 % 02/14/24 1040  Vitals shown include unfiled device data.  Last Pain:  Vitals:   02/14/24 0926  TempSrc: Oral  PainSc:          Complications: No notable events documented.

## 2024-02-14 NOTE — Progress Notes (Signed)
 PROGRESS NOTE    Jeremy [name]  JJO:[id number] DOB: 01-10-1941 DOA: 02/13/2024 PCP: Everrett Coombe, DO   Brief Narrative:  Jeremy [name] is a 83 y.o. male with medical history significant for COPD, on 3 L oxygen supplementation at baseline, OSA on CPAP, ESRD on HD TTS, chronic HFpEF, hyperlipidemia, history of bladder cancer, macular degeneration, who presents to the ER after a mechanical fall having tripped on a rug.  Imaging reported right femoral fracture, hospitalist called for admission, orthopedics called in consult.  Assessment & Plan:   Principal Problem:   Right femoral fracture (HCC)  Right femoral fracture, status post mechanical fall -Status post ORIF 4/ 5/25 with orthopedic surgery, Dr. 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 Previous baseline hemoglobin around 12 (although 7.5 3 months ago) 7.6 postoperatively -continue to follow, transfuse if symptomatic or hemoglobin less than 7   Leukocytosis, suspect reactive from femur fracture Patient does not meet sepsis criteria Likely reactive secondary to above Follow repeat labs   Chronic hypoxic respiratory failure on 3 L nasal cannula continuously COPD, not in acute exacerbation -Continue home regimen including Yupelri, Brovana, budesonide -Continue oxygen, titrate to 3 L which is his baseline   Chronic HFpEF -No acute exacerbation, appears euvolemic -Volume management per nephrology/dialysis   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   DVT prophylaxis: SCDs Start: 02/13/24 2114 Code Status:   Code Status: Full Code Family  Communication: None present  Status is: Inpatient  Dispo: The patient is from: Home              Anticipated d/c is to: To be determined              Anticipated d/c date is: 48 to 72 hours              Patient currently not medically stable for discharge given ongoing need for postsurgical care, PT evaluation and safe disposition  Consultants:  Nephrology, orthopedic surgery  Procedures:  ORIF 02/14/24  Antimicrobials:  Perioperatively  Subjective: No acute issues or events overnight denies nausea vomiting diarrhea constipation any fevers chills or chest pain.  Right hip pain ongoing but improved.  Objective: Vitals:   02/13/24 1849 02/13/24 2112 02/13/24 2337 02/14/24 0400  BP: (!) 204/70 (!) 143/65 (!) 144/54 (!) 128/58  Pulse: 84 86 85 76  Resp: 17 18 18 18   Temp:  99 F (37.2 C) 98.8 F (37.1 C) 97.8 F (36.6 C)  TempSrc:  Oral Oral Oral  SpO2: 98% 91% 90% 99%  Weight:      Height:       No intake or output data in the 24 hours ending 02/14/24 0720 Filed Weights   02/13/24 1650  Weight: 71.2 kg    Examination:  General:  Pleasantly resting in bed, No acute distress. HEENT:  Normocephalic atraumatic.  Sclerae nonicteric, noninjected.  Extraocular movements intact bilaterally. Neck:  Without mass or deformity.  Trachea is midline. Lungs:  Clear to auscultate bilaterally without rhonchi, wheeze, or rales. Heart:  Regular rate and rhythm.  Without murmurs, rubs, or gallops. Abdomen:  Soft, nontender, nondistended.  Without guarding or rebound.  Extremities: Without cyanosis, clubbing, edema  Data Reviewed: I have personally reviewed following labs and imaging studies  CBC: Recent Labs  Lab 02/13/24 1821 02/14/24 0523  WBC 15.1* 10.1  HGB 8.9* 7.6*  HCT 28.1* 24.4*  MCV 102.6* 101.7*  PLT 272 248   Basic Metabolic Panel: Recent Labs  Lab 02/13/24 1821 02/14/24 0523  NA 141 142  K 4.6 5.1  CL 98 99  CO2 29 29  GLUCOSE 111* 91  BUN 42* 50*   CREATININE 6.27* 7.32*  CALCIUM 8.8* 8.6*  MG  --  2.2  PHOS  --  7.1*   GFR: Estimated Creatinine Clearance: 7.7 mL/min (A) (by C-G formula based on SCr of 7.32 mg/dL (H)). Liver Function Tests: Recent Labs  Lab 02/14/24 0523  ALBUMIN 3.1*   No results for input(s): "LIPASE", "AMYLASE" in the last 168 hours. No results for input(s): "AMMONIA" in the last 168 hours. Coagulation Profile: Recent Labs  Lab 02/14/24 0523  INR 1.2   Cardiac Enzymes: No results for input(s): "CKTOTAL", "CKMB", "CKMBINDEX", "TROPONINI" in the last 168 hours. BNP (last 3 results) No results for input(s): "PROBNP" in the last 8760 hours. HbA1C: No results for input(s): "HGBA1C" in the last 72 hours. CBG: No results for input(s): "GLUCAP" in the last 168 hours. Lipid Profile: No results for input(s): "CHOL", "HDL", "LDLCALC", "TRIG", "CHOLHDL", "LDLDIRECT" in the last 72 hours. Thyroid Function Tests: No results for input(s): "TSH", "T4TOTAL", "FREET4", "T3FREE", "THYROIDAB" in the last 72 hours. Anemia Panel: No results for input(s): "VITAMINB12", "FOLATE", "FERRITIN", "TIBC", "IRON", "RETICCTPCT" in the last 72 hours. Sepsis Labs: No results for input(s): "PROCALCITON", "LATICACIDVEN" in the last 168 hours.  Recent Results (from the past 240 hours)  Surgical pcr screen     Status: None   Collection Time: 02/14/24  1:19 AM   Specimen: Nasal Mucosa; Nasal Swab  Result Value Ref Range Status   MRSA, PCR NEGATIVE NEGATIVE Final   Staphylococcus aureus NEGATIVE NEGATIVE Final    Comment: (NOTE) The Xpert SA Assay (FDA approved for NASAL specimens in patients 4 years of age and older), is one component of a comprehensive surveillance program. It is not intended to diagnose infection nor to guide or monitor treatment. Performed at [facility], [address] [place], [place] [address]          Radiology Studies: DG CHEST PORT 1 VIEW Result Date: 02/13/2024 CLINICAL DATA:  Preop  right hip rales EXAM: PORTABLE CHEST 1 VIEW COMPARISON:  01/14/2024, chest CT 01/26/2024, chest x-ray 09/22/2023 FINDINGS: Emphysema and chronic bronchitic changes. No acute airspace disease or pleural effusion. Upper normal cardiac silhouette. Aortic atherosclerosis. No pneumothorax IMPRESSION: No active disease. Emphysema and chronic bronchitic changes. Electronically Signed   By: Jasmine Pang M.D.   On: 02/13/2024 23:34   CT Hip Right Wo Contrast Result Date: 02/13/2024 CLINICAL DATA:  Right hip pain after fall. EXAM: CT OF THE RIGHT HIP WITHOUT CONTRAST TECHNIQUE: Multidetector CT imaging of the right hip was performed according to the standard protocol. Multiplanar CT image reconstructions were also generated. RADIATION DOSE REDUCTION: This exam was performed according to the departmental dose-optimization program which includes automated exposure control, adjustment of the mA and/or kV according to patient size and/or use of iterative reconstruction technique. COMPARISON:  Right hip radiographs dated 02/13/2024. FINDINGS: Bones/Joint/Cartilage Minimally impacted subcapital fracture of the right femoral head neck junction. Nondisplaced fracture of the right superior pubic ramus. The right femoral head is seated within the acetabulum.  Mild degenerative changes of the right hip. The right sacroiliac joint appears intact. Degenerative changes of the visualized lower lumbar spine. Ligaments Ligaments are suboptimally evaluated by CT. Muscles and Tendons Fullness and heterogeneity of the right iliacus muscle, compatible with intramuscular hematoma. Soft tissue Soft tissue swelling along posterolateral hip. Aortoiliac atherosclerosis. IMPRESSION: 1. Minimally impacted subcapital fracture of the right femoral head and neck junction. 2. Nondisplaced fracture of the right superior pubic ramus. 3. Intramuscular hematoma within the right iliacus muscle. Electronically Signed   By: Hart Robinsons M.D.   On: 02/13/2024  20:09   DG Hip Unilat With Pelvis 2-3 Views Right Result Date: 02/13/2024 CLINICAL DATA:  Right hip pain following a fall today. EXAM: DG HIP (WITH OR WITHOUT PELVIS) 2-3V RIGHT COMPARISON:  None Available. FINDINGS: Diffuse osteopenia. Probable minimally compressed right subcapital femoral neck fracture. Atheromatous arterial calcifications. Small amount of barium retained in distal descending colon diverticula. IMPRESSION: 1. Probable minimally compressed right subcapital femoral neck fracture. Recommend confirmation with a right hip CT without contrast. 2. Diffuse osteopenia. Electronically Signed   By: Beckie Salts M.D.   On: 02/13/2024 15:54        Scheduled Meds:  arformoterol  15 mcg Nebulization BID   budesonide  0.5 mg Nebulization BID   metoprolol tartrate  12.5 mg Oral BID   olopatadine  1 drop Both Eyes BID   revefenacin  175 mcg Nebulization Daily   sevelamer carbonate  2.4 g Oral TID WC   Continuous Infusions:   LOS: 1 day   Time spent:  Azucena Fallen, DO [facility]  If 7PM-7AM, please contact night-coverage [email or web address]  02/14/2024, 7:20 AM

## 2024-02-14 NOTE — Discharge Instructions (Signed)
     Discharge Instructions    Attending Surgeon: Huel Cote, MD Office Phone Number: [phone]   Diagnosis and Procedures:    Surgeries Performed: Right hip screws placement  Discharge Plan:    Diet: Resume usual diet. Begin with light or bland foods.  Drink plenty of fluids.  Activity:  Weight bearing as tolerated right leg. You are advised to go home directly from the hospital or surgical center. Restrict your activities.  GENERAL INSTRUCTIONS: 1.  Please apply ice to your wound to help with swelling and inflammation. This will improve your comfort and your overall recovery following surgery.     2. Please call Dr. Serena Croissant office at [phone] with questions Monday-Friday during business hours. If no one answers, please leave a message and someone should get back to the patient within 24 hours. For emergencies please call 911 or proceed to the emergency room.   3. Patient to notify surgical team if experiences any of the following: Bowel/Bladder dysfunction, uncontrolled pain, nerve/muscle weakness, incision with increased drainage or redness, nausea/vomiting and Fever greater than 101.0 F.  Be alert for signs of infection including redness, streaking, odor, fever or chills. Be alert for excessive pain or bleeding and notify your surgeon immediately.  WOUND INSTRUCTIONS:   Leave your dressing, cast, or splint in place until your post operative visit.  Keep it clean and dry.  Always keep the incision clean and dry until the staples/sutures are removed. If there is no drainage from the incision you should keep it open to air. If there is drainage from the incision you must keep it covered at all times until the drainage stops  Do not soak in a bath tub, hot tub, pool, lake or other body of water until 21 days after your surgery and your incision is completely dry and healed.  If you have removable sutures (or staples) they must be removed 10-14 days (unless otherwise  instructed) from the day of your surgery.     1)  Elevate the extremity as much as possible.  2)  Keep the dressing clean and dry.  3)  Please call us if the dressing becomes wet or dirty.  4)  If you are experiencing worsening pain or worsening swelling, please call.     MEDICATIONS: Resume all previous home medications at the previous prescribed dose and frequency unless otherwise noted Start taking the  pain medications on an as-needed basis as prescribed  Please taper down pain medication over the next week following surgery.  Ideally you should not require a refill of any narcotic pain medication.  Take pain medication with food to minimize nausea. In addition to the prescribed pain medication, you may take over-the-counter pain relievers such as Tylenol.  Do NOT take additional tylenol if your pain medication already has tylenol in it.  Aspirin 325mg  daily per instructions on bottle. Narcotic policy: Per [facility] clinic policy, our goal is ensure optimal postoperative pain control with a multimodal pain management strategy. For all [facility] patients, our goal is to wean post-operative narcotic medications by 6 weeks post-operatively, and many times sooner. If this is not possible due to utilization of pain medication prior to surgery, your [facility] doctor will support your acute post-operative pain control for the first 6 weeks postoperatively, with a plan to transition you back to your primary pain team following that. Cyndia Skeeters will work to ensure a Therapist, occupational.

## 2024-02-14 NOTE — Interval H&P Note (Signed)
 History and Physical Interval Note:  02/14/2024 8:35 AM  Jeremy [name]  has presented today for surgery, with the diagnosis of RIGHT HIP FRACTURE.  The various methods of treatment have been discussed with the patient and family. After consideration of risks, benefits and other options for treatment, the patient has consented to  Procedure(s) with comments: OPEN REDUCTION INTERNAL FIXATION HIP (Right) - RIGHT COMPRESSION HIP as a surgical intervention.  The patient's history has been reviewed, patient examined, no change in status, stable for surgery.  I have reviewed the patient's chart and labs.  Questions were answered to the patient's satisfaction.     Huel Cote

## 2024-02-14 NOTE — Procedures (Signed)
 I was present at the procedure, reviewed the HD regimen and made appropriate changes.   [name] MD  [facility] 02/14/2024, 5:42 PM

## 2024-02-14 NOTE — H&P (View-Only) (Signed)
 ORTHOPAEDIC CONSULTATION  REQUESTING PHYSICIAN: Azucena Fallen, MD  Chief Complaint: Hip pain  HPI: Jeremy [name] is a 83 y.o. male who presents with right hip pain after a fall while trying to pay his bill [place].  He does have a history of chronic kidney disease.  He is on oxygen at baseline.  He is quite dismayed as he was making an effort to be more active prior to this and was hopeful to get back to golf as he was not requiring any type of ambulatory assistive devices.  He was eventually seen in the urgent care and sent to [facility] for concern for femoral neck fracture  Past Medical History:  Diagnosis Date   Anemia    Cancer [facility])    patient and family have not been told that he has cancer.   Chronic kidney disease    dialysis T/Th/Sa   COPD (chronic obstructive pulmonary disease) (HCC)    Enlarged prostate    History of blood transfusion    Hyperlipidemia    Hypertension    Self-catheterizes urinary bladder    pt. does this morning and evening--been doing this since 11/2016   Sleep apnea    not using a cpap machine   Past Surgical History:  Procedure Laterality Date   A/V FISTULAGRAM Left 03/12/2023   Procedure: A/V Fistulagram;  Surgeon: Victorino Sparrow, MD;  Location: [facility] INVASIVE CV LAB;  Service: Cardiovascular;  Laterality: Left;   A/V FISTULAGRAM Left 10/31/2023   Procedure: A/V Fistulagram;  Surgeon: Ethelene Hal, MD;  Location: [facility] INVASIVE CV LAB;  Service: Cardiovascular;  Laterality: Left;   AV FISTULA PLACEMENT Left 10/16/2022   Procedure: LEFT BRACHIOCEPHALIC ARTERIOVENOUS (AV) FISTULA CREATION;  Surgeon: Cephus Shelling, MD;  Location: [facility] OR;  Service: Vascular;  Laterality: Left;   BACK SURGERY     BLADDER TUMOR EXCISION  2015   benign   BLEPHAROPLASTY  12/2018   LIGATION OF COMPETING BRANCHES OF ARTERIOVENOUS FISTULA Left 03/17/2023   Procedure: LEFT ARM FISTULA BRANCH LIGATION;  Surgeon: Victorino Sparrow, MD;  Location: [facility] OR;   Service: Vascular;  Laterality: Left;   LUMBAR LAMINECTOMY/DECOMPRESSION MICRODISCECTOMY Bilateral 08/25/2017   Procedure: Bilateral Lumbar Three- Four, Lumbar Four- Five, Lumbar Five- Sacral One Laminectomy;  Surgeon: Barnett Abu, MD;  Location: [facility] OR;  Service: Neurosurgery;  Laterality: Bilateral;  Bilateral L3-4 L4-5 L5-S1 Laminectomy   SKIN CANCER EXCISION     Surg x2...   TONSILLECTOMY AND ADENOIDECTOMY     Surg as a child...   TRANSURETHRAL RESECTION OF BLADDER TUMOR N/A 01/03/2022   Procedure: TRANSURETHRAL RESECTION OF BLADDER TUMOR (TURBT)/ CYSTOSCOPY/ EXAM UNDER ANESTHESIA, BILATERAL RETROGRADE/ BLADDER BIOPSIES;  Surgeon: Heloise Purpura, MD;  Location: [facility] ORS;  Service: Urology;  Laterality: N/A;  GENERAL ANESTHESIA WITH PARALYSIS   Social History   Socioeconomic History   Marital status: Married    Spouse name: Not on file   Number of children: Not on file   Years of education: Not on file   Highest education level: Not on file  Occupational History   Not on file  Tobacco Use   Smoking status: Former    Current packs/day: 0.00    Average packs/day: 0.2 packs/day for 61.5 years (12.3 ttl pk-yrs)    Types: Cigarettes    Start date: 12/29/1961    Quit date: 07/2023    Years since quitting: 0.5    Passive exposure: Never   Smokeless tobacco: Never  Tobacco comments:    Smokes 1/2 pack a day     Pt states he quit smoking 2 months ago. AB, CMA 10-07-23  Vaping Use   Vaping status: Never Used  Substance and Sexual Activity   Alcohol use: Not Currently    Comment: couple glasses with dinner daily   Drug use: No   Sexual activity: Not on file  Other Topics Concern   Not on file  Social History Narrative   Lives with wife in a 2 story home.  Has 2 children.  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)   Social Drivers of Health   Financial Resource Strain: Not on file  Food Insecurity: No Food Insecurity (02/13/2024)   Hunger Vital Sign    Worried About Running  Out of Food in the Last Year: Never true    Ran Out of Food in the Last Year: Never true  Transportation Needs: No Transportation Needs (02/13/2024)   PRAPARE - Administrator, Civil Service (Medical): No    Lack of Transportation (Non-Medical): No  Physical Activity: Not on file  Stress: Not on file  Social Connections: Socially Integrated (02/14/2024)   Social Connection and Isolation Panel [NHANES]    Frequency of Communication with Friends and Family: More than three times a week    Frequency of Social Gatherings with Friends and Family: More than three times a week    Attends Religious Services: 1 to 4 times per year    Active Member of [employer] or Organizations: Yes    Attends Engineer, structural: More than 4 times per year    Marital Status: Married   Family History  Problem Relation Age of Onset   Colon cancer Neg Hx    Stomach cancer Neg Hx    Rectal cancer Neg Hx    - negative except otherwise stated in the family history section Allergies  Allergen Reactions   Sulfa Antibiotics     Rash, light headed, shaking   Penicillins Other (See Comments)    hives severe as a child    Tape     Pulls skin off   Prior to Admission medications   Medication Sig Start Date End Date Taking? Authorizing Provider  antiseptic oral rinse (BIOTENE) LIQD 2 Applications by Mouth Rinse route as needed for dry mouth.   Yes [provider]  arformoterol (BROVANA) 15 MCG/2ML NEBU Substituted for: Brovana Neb Solution Inhale one vial in nebulizer twice a day. Patient taking differently: Take 15 mcg by nebulization in the morning and at bedtime. 02/09/24  Yes Glenford Bayley, NP  budesonide (PULMICORT) 0.5 MG/2ML nebulizer solution Generic for Pulmicort. Inhale one vial in nebulizer twice a day. Rinse mouth after use. Patient taking differently: Take 0.5 mg by nebulization in the morning and at bedtime. 02/09/24  Yes Glenford Bayley, NP  Cholecalciferol (VITAMIN D) 50  MCG (2000 UT) CAPS Take 2,000 Units by mouth daily. Crush and Mix with something   Yes [provider]  Dextromethorphan-guaiFENesin (MUCINEX DM PO) Take 20 mLs by mouth in the morning and at bedtime. Add to pudding or applesauce   Yes [provider]  ipratropium (ATROVENT) 0.03 % nasal spray Place 2 sprays into both nostrils 2 (two) times daily. 10/07/23  Yes Glenford Bayley, NP  Loperamide HCl (IMODIUM PO) Take 30 mLs by mouth as needed.   Yes [provider]  metoprolol tartrate (LOPRESSOR) 25 MG tablet Take 1 tablet (25 mg total)  by mouth 2 (two) times daily. 12/01/23  Yes Everrett Coombe, DO  Multiple Vitamin (MULTIVITAMIN) LIQD Take 5 mLs by mouth daily.   Yes [provider]  Olopatadine HCl (PATADAY OP) Place 2 drops into both eyes as needed.   Yes [provider]  OXYGEN Inhale 3 L into the lungs continuous.   Yes [provider]  revefenacin (YUPELRI) 175 MCG/3ML nebulizer solution Inhale one vial in nebulizer once daily. Do not mix with other nebulized medications. Patient taking differently: Take 175 mcg by nebulization daily. 02/09/24  Yes Glenford Bayley, NP  sevelamer carbonate (RENVELA) 0.8 g PACK packet Take 0.8 g by mouth daily as needed (snacks). 10/28/23  Yes [provider]  sevelamer carbonate (RENVELA) 2.4 g PACK Take 2.4 g by mouth 3 (three) times daily with meals.   Yes [provider]  AMBULATORY NON FORMULARY MEDICATION Please provide portable O2 concentrator for patient. Flow 2L/min, 24/7 use.  J96.10-Chronic respiratory failure. 09/12/23   Everrett Coombe, DO  AMBULATORY NON FORMULARY MEDICATION Please provide portable oxygen concentrator.  Flow 3L/min.  Dx: Chronic respiratory failure J96.11, COPD J42 09/22/23   Everrett Coombe, DO   DG CHEST PORT 1 VIEW Result Date: 02/13/2024 CLINICAL DATA:  Preop right hip rales EXAM: PORTABLE CHEST 1 VIEW COMPARISON:  01/14/2024, chest CT 01/26/2024, chest x-ray  09/22/2023 FINDINGS: Emphysema and chronic bronchitic changes. No acute airspace disease or pleural effusion. Upper normal cardiac silhouette. Aortic atherosclerosis. No pneumothorax IMPRESSION: No active disease. Emphysema and chronic bronchitic changes. Electronically Signed   By: Jasmine Pang M.D.   On: 02/13/2024 23:34   CT Hip Right Wo Contrast Result Date: 02/13/2024 CLINICAL DATA:  Right hip pain after fall. EXAM: CT OF THE RIGHT HIP WITHOUT CONTRAST TECHNIQUE: Multidetector CT imaging of the right hip was performed according to the standard protocol. Multiplanar CT image reconstructions were also generated. RADIATION DOSE REDUCTION: This exam was performed according to the departmental dose-optimization program which includes automated exposure control, adjustment of the mA and/or kV according to patient size and/or use of iterative reconstruction technique. COMPARISON:  Right hip radiographs dated 02/13/2024. FINDINGS: Bones/Joint/Cartilage Minimally impacted subcapital fracture of the right femoral head neck junction. Nondisplaced fracture of the right superior pubic ramus. The right femoral head is seated within the acetabulum. Mild degenerative changes of the right hip. The right sacroiliac joint appears intact. Degenerative changes of the visualized lower lumbar spine. Ligaments Ligaments are suboptimally evaluated by CT. Muscles and Tendons Fullness and heterogeneity of the right iliacus muscle, compatible with intramuscular hematoma. Soft tissue Soft tissue swelling along posterolateral hip. Aortoiliac atherosclerosis. IMPRESSION: 1. Minimally impacted subcapital fracture of the right femoral head and neck junction. 2. Nondisplaced fracture of the right superior pubic ramus. 3. Intramuscular hematoma within the right iliacus muscle. Electronically Signed   By: Hart Robinsons M.D.   On: 02/13/2024 20:09   DG Hip Unilat With Pelvis 2-3 Views Right Result Date: 02/13/2024 CLINICAL DATA:  Right hip  pain following a fall today. EXAM: DG HIP (WITH OR WITHOUT PELVIS) 2-3V RIGHT COMPARISON:  None Available. FINDINGS: Diffuse osteopenia. Probable minimally compressed right subcapital femoral neck fracture. Atheromatous arterial calcifications. Small amount of barium retained in distal descending colon diverticula. IMPRESSION: 1. Probable minimally compressed right subcapital femoral neck fracture. Recommend confirmation with a right hip CT without contrast. 2. Diffuse osteopenia. Electronically Signed   By: Beckie Salts M.D.   On: 02/13/2024 15:54     Positive ROS: All other systems have  been reviewed and were otherwise negative with the exception of those mentioned in the HPI and as above.  Physical Exam: General: No acute distress Cardiovascular: No pedal edema Respiratory: No cyanosis, no use of accessory musculature GI: No organomegaly, abdomen is soft and non-tender Skin: No lesions in the area of chief complaint Neurologic: Sensation intact distally Psychiatric: Patient is at baseline mood and affect Lymphatic: No axillary or cervical lymphadenopathy  MUSCULOSKELETAL:  Right hip with equal leg lengths.  Fires tibialis anterior as well as gastrocsoleus.  2+ dorsalis pedis pulse  Independent Imaging Review: X-rays 3 views right hip CT scan right hip: Impacted femoral neck fracture  Assessment: 83 year old male with right valgus impacted femoral neck fracture.  Overall I did discuss that I would recommend stabilization with screws given his impacted nature of the fracture.  I would allow for immediate weightbearing.  I did discuss that without this there would be concern and risk for displacement of the fracture.   Plan: Plan for right hip femoral neck cannulated screws placement   After a lengthy discussion of treatment options, including risks, benefits, alternatives, complications of surgical and nonsurgical conservative options, the patient elected surgical repair.   The  patient  is aware of the material risks  and complications including, but not limited to injury to adjacent structures, neurovascular injury, infection, numbness, bleeding, implant failure, thermal burns, stiffness, persistent pain, failure to heal, disease transmission from allograft, need for further surgery, dislocation, anesthetic risks, blood clots, risks of death,and others. The probabilities of surgical success and failure discussed with patient given their particular co-morbidities.The time and nature of expected rehabilitation and recovery was discussed.The patient's questions were all answered preoperatively.  No barriers to understanding were noted. I explained the natural history of the disease process and Rx rationale.  I explained to the patient what I considered to be reasonable expectations given their personal situation.  The final treatment plan was arrived at through a shared patient decision making process model.   Thank you for the consult and the opportunity to see Jeremy [name]  Huel Cote, MD [facility] 6:20 AM

## 2024-02-14 NOTE — Op Note (Signed)
   Date of Surgery: 02/14/2024  INDICATIONS: Jeremy [name] is a 83 y.o.-year-old male with right nondisplaced impacted femoral neck fracture.  The risk and benefits of the procedure were discussed in detail and documented in the pre-operative evaluation.   PREOPERATIVE DIAGNOSIS: 1.  Right impacted femoral neck fracture  POSTOPERATIVE DIAGNOSIS: Same.  PROCEDURE: 1.  Right Hip 3 screws placement  SURGEON: Benancio Deeds MD  ASSISTANT: Ardeen Fillers, ATC  ANESTHESIA: Spinal plus MAC  IV FLUIDS AND URINE: See anesthesia record.  ANTIBIOTICS: Ancef  ESTIMATED BLOOD LOSS: 15 mL.  IMPLANTS:  Implant Name Type Inv. Item Serial No. Manufacturer Lot No. LRB No. Used Action  SCREW CANN THD 90X7.0 - [id number] Screw SCREW CANN THD 90X7.0  [employer](ORTH,TRAU,BIO,SG)  Right 3 Implanted    DRAINS: None  CULTURES: None  COMPLICATIONS: none  DESCRIPTION OF PROCEDURE:  The patient's chart/medical history was reviewed and decision was made to administer peri-operative trans-exemic acid.  I have reviewed the patient's history and given the presence of a fragility fracture, I have deemed the necessity for a osteoporosis management referral or confirmed that the patient is currently enrolled in a osteoporosis treatment program.  The patient was identified in the preoperative holding area.  The correct site was marked according universal protocol with nursing.  She was subsequently taken back to the operating room.  Anesthesia was induced.  The patient was prepped and draped in usual sterile fashion.  Again final timeout was performed.  A guidewire was used with fluoroscopy in order to localize the skin incision about the lateral aspect of the trochanter.  10 blade was used to incise through skin and IT band.  A Cobb was used to elevate the vastus lateralis off the bone.  A wire was then introduced and this was placed in the inferior central position under direct AP and lateral  fluoroscopy.  An additional anterior superior and posterior superior wire were then placed again under direct visualization.  Once we are happy with the placement of the wires, the drill was used to drill the lateral cortex.  A measuring guide was used off the lateral cortex to determine screw sizes.  The appropriately sized screw as noted above was placed in the correct positions.  These all had excellent purchase.  Guidewires were removed and dynamic fluoroscopy was used to confirm all screws to be in the bone.  The wound was thoroughly irrigated and closed in layers of 0 Vicryl 2-0 Vicryl and staples.  Aquacel dressing was applied.  All counts were correct at the end of the case.  She was awoken taken to the PACU without complication       POSTOPERATIVE PLAN: Will be weightbearing as tolerated on the right hip.  He will be seen by physical therapy while in house.  Likely will need discharge to rehab.  He will placed on aspirin for blood clot prevention  Benancio Deeds, MD 11:02 AM

## 2024-02-14 NOTE — Brief Op Note (Signed)
   Brief Op Note  Date of Surgery: 02/14/2024  Preoperative Diagnosis: RIGHT HIP FRACTURE  Postoperative Diagnosis: same  Procedure: Procedure(s): OPEN REDUCTION INTERNAL FIXATION HIP  Implants: Implant Name Type Inv. Item Serial No. Manufacturer Lot No. LRB No. Used Action  SCREW CANN THD 90X7.0 - [id number] Screw SCREW CANN THD 90X7.0  [employer](ORTH,TRAU,BIO,SG)  Right 3 Implanted    Surgeons: Surgeon(s): Huel Cote, MD  Anesthesia: Spinal    Estimated Blood Loss: See anesthesia record  Complications: None  Condition to PACU: Stable  Benancio Deeds, MD 02/14/2024 11:01 AM

## 2024-02-14 NOTE — Consult Note (Signed)
 Renal Service Consult Note [facility]  CROY DRUMWRIGHT 02/14/2024 Maree Krabbe, MD Requesting Physician: Dr. Natale Milch  Reason for Consult: ESRD patient with hip fracture HPI: The patient is a 83 y.o. year-old w/ PMH as below who presented to ED yesterday afternoon after a fall.  Patient is on dialysis.  He is also on oxygen at baseline. [place] while trying to pay his bill.  He was seen in urgent care then sent to [facility] for concern of hip fracture.  In the ED blood pressure 180/65, HR 84, RR 17 and temp 98.7.  Labs showed K+ 4.6, BUN 42, creatinine 6.3, calcium 8.8, hemoglobin 8.9, WBC 15K.  CT of the right hip showed fracture of the right femoral head and neck junction.  Patient was seen by orthopedic surgery and admitted to medical service.  Plan is for hip surgery today.  We are asked to see for dialysis.   Pt seen in HD unit. He just had hip surgery. Is in good spirits. Remembers our last interaction months ago when was here for other issues. Denies any SOB, leg swelling. Wears 3 L [place] O2 at home.    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) Sleep apnea ESRD on HD  Past Surgical History  Past Surgical History:  Procedure Laterality Date   A/V FISTULAGRAM Left 03/12/2023   Procedure: A/V Fistulagram;  Surgeon: Victorino Sparrow, MD;  Location: [facility] INVASIVE CV LAB;  Service: Cardiovascular;  Laterality: Left;   A/V FISTULAGRAM Left 10/31/2023   Procedure: A/V Fistulagram;  Surgeon: Ethelene Hal, MD;  Location: [facility] INVASIVE CV LAB;  Service: Cardiovascular;  Laterality: Left;   AV FISTULA PLACEMENT Left 10/16/2022   Procedure: LEFT BRACHIOCEPHALIC ARTERIOVENOUS (AV) FISTULA CREATION;  Surgeon: Cephus Shelling, MD;  Location: [facility] OR;  Service: Vascular;  Laterality: Left;   BACK SURGERY      BLADDER TUMOR EXCISION  2015   benign   BLEPHAROPLASTY  12/2018   LIGATION OF COMPETING BRANCHES OF ARTERIOVENOUS FISTULA Left 03/17/2023   Procedure: LEFT ARM FISTULA BRANCH LIGATION;  Surgeon: Victorino Sparrow, MD;  Location: [facility] OR;  Service: Vascular;  Laterality: Left;   LUMBAR LAMINECTOMY/DECOMPRESSION MICRODISCECTOMY Bilateral 08/25/2017   Procedure: Bilateral Lumbar Three- Four, Lumbar Four- Five, Lumbar Five- Sacral One Laminectomy;  Surgeon: Barnett Abu, MD;  Location: [facility] OR;  Service: Neurosurgery;  Laterality: Bilateral;  Bilateral L3-4 L4-5 L5-S1 Laminectomy   SKIN CANCER EXCISION     Surg x2...   TONSILLECTOMY AND ADENOIDECTOMY     Surg as a child...   TRANSURETHRAL RESECTION OF BLADDER TUMOR N/A 01/03/2022   Procedure: TRANSURETHRAL RESECTION OF BLADDER TUMOR (TURBT)/ CYSTOSCOPY/ EXAM UNDER ANESTHESIA, BILATERAL RETROGRADE/ BLADDER BIOPSIES;  Surgeon: Heloise Purpura, MD;  Location: [facility] ORS;  Service: Urology;  Laterality: N/A;  GENERAL ANESTHESIA WITH PARALYSIS   Family History  Family History  Problem Relation Age of Onset   Colon cancer Neg Hx    Stomach cancer Neg Hx    Rectal cancer Neg Hx    Social History  reports that he quit smoking about 7 months ago. His smoking use included cigarettes. He started smoking about 62 years ago. He has a 12.3 pack-year smoking history. He has never been exposed to tobacco smoke. He has never used smokeless tobacco.  He reports that he does not currently use alcohol. He reports that he does not use drugs. Allergies  Allergies  Allergen Reactions   Sulfa Antibiotics     Rash, light headed, shaking   Penicillins Other (See Comments)    hives severe as a child 4/5 tolerated cefazolin   Tape     Pulls skin off   Home medications Prior to Admission medications   Medication Sig Start Date End Date Taking? Authorizing Provider  antiseptic oral rinse (BIOTENE) LIQD 2 Applications by Mouth Rinse route as needed for dry mouth.   Yes  [provider]  arformoterol (BROVANA) 15 MCG/2ML NEBU Substituted for: Brovana Neb Solution Inhale one vial in nebulizer twice a day. Patient taking differently: Take 15 mcg by nebulization in the morning and at bedtime. 02/09/24  Yes Glenford Bayley, NP  budesonide (PULMICORT) 0.5 MG/2ML nebulizer solution Generic for Pulmicort. Inhale one vial in nebulizer twice a day. Rinse mouth after use. Patient taking differently: Take 0.5 mg by nebulization in the morning and at bedtime. 02/09/24  Yes Glenford Bayley, NP  Cholecalciferol (VITAMIN D) 50 MCG (2000 UT) CAPS Take 2,000 Units by mouth daily. Crush and Mix with something   Yes [provider]  Dextromethorphan-guaiFENesin (MUCINEX DM PO) Take 20 mLs by mouth in the morning and at bedtime. Add to pudding or applesauce   Yes [provider]  ipratropium (ATROVENT) 0.03 % nasal spray Place 2 sprays into both nostrils 2 (two) times daily. 10/07/23  Yes Glenford Bayley, NP  Loperamide HCl (IMODIUM PO) Take 30 mLs by mouth as needed.   Yes [provider]  metoprolol tartrate (LOPRESSOR) 25 MG tablet Take 1 tablet (25 mg total) by mouth 2 (two) times daily. 12/01/23  Yes Everrett Coombe, DO  Multiple Vitamin (MULTIVITAMIN) LIQD Take 5 mLs by mouth daily.   Yes [provider]  Olopatadine HCl (PATADAY OP) Place 2 drops into both eyes as needed.   Yes [provider]  OXYGEN Inhale 3 L into the lungs continuous.   Yes [provider]  revefenacin (YUPELRI) 175 MCG/3ML nebulizer solution Inhale one vial in nebulizer once daily. Do not mix with other nebulized medications. Patient taking differently: Take 175 mcg by nebulization daily. 02/09/24  Yes Glenford Bayley, NP  sevelamer carbonate (RENVELA) 0.8 g PACK packet Take 0.8 g by mouth daily as needed (snacks). 10/28/23  Yes [provider]  sevelamer carbonate (RENVELA) 2.4 g PACK Take 2.4 g by mouth 3 (three) times daily  with meals.   Yes [provider]  AMBULATORY NON FORMULARY MEDICATION Please provide portable O2 concentrator for patient. Flow 2L/min, 24/7 use.  J96.10-Chronic respiratory failure. 09/12/23   Everrett Coombe, DO  AMBULATORY NON FORMULARY MEDICATION Please provide portable oxygen concentrator.  Flow 3L/min.  Dx: Chronic respiratory failure J96.11, COPD J42 09/22/23   Everrett Coombe, DO     Vitals:   02/14/24 0926 02/14/24 0931 02/14/24 1030 02/14/24 1045  BP: (!) 148/70  (!) 139/50 (!) 141/58  Pulse: 80  90 80  Resp: (!) 21  17 (!) 22  Temp: 98.5 F (36.9 C)  (!) 97.5 F (36.4 C)   TempSrc: Oral     SpO2: 92%  92% 96%  Weight:  71.2 kg    Height:  5\' 10"  (1.778 m)     Exam Gen alert, no distress, elderly man, [place] O2 No rash, cyanosis or gangrene Sclera anicteric, throat clear  No jvd or  bruits Chest clear bilat to bases, no rales/ wheezing RRR no MRG Abd soft ntnd no mass or ascites +bs GU nl male MS no joint effusions or deformity Ext no LE or UE edema, no other edema Neuro is alert, Ox 3 , nf    LUA AVF+bruit    Renal-related home meds: Renvela 2.4 g AC 3 times daily Home oxygen 3 L continuous Metoprolol 12.5 mg twice daily Others: Revefenacin nebulizer, eyedrops, Pulmicort, Brovana     OP HD: SW TTS As of oct 2024 --> 3h 73.3kg   B400    2K bath  Heparin 2500 AVF   K 5.1, bun 50, creat 7.3,  Hb 7.6  wbc 10K     CXR - IMPRESSION: No active disease. Emphysema and chronic bronchitic changes.  Assessment/ Plan: R hip fracture: sp R hip surgery 4/05 today. Per orthopedics ESRD: on HD TTS. Getting HD at this time on schedule.  HTN: bp's stable, cont home metoprolol.  Volume: not vol overloaded on exam, CXR clear from 4/04. Min UF.  Anemia of esrd: Hb 7-9 here, transfuse prn.  Secondary hyperparathyroidism: CCa in range, phos a bit high. Cont binders w/ meals.  COPD: on 3 L [place] home O2      Rob Arlean Hopping  MD [facility] 02/14/2024, 10:52 AM  Recent Labs   Lab 02/13/24 1821 02/14/24 0523  HGB 8.9* 7.6*  ALBUMIN  --  3.1*  CALCIUM 8.8* 8.6*  PHOS  --  7.1*  CREATININE 6.27* 7.32*  K 4.6 5.1   Inpatient medications:  [MAR Hold] arformoterol  15 mcg Nebulization BID   [MAR Hold] budesonide  0.5 mg Nebulization BID   chlorhexidine  60 mL Topical Once   chlorhexidine       [MAR Hold] metoprolol tartrate  12.5 mg Oral BID   [MAR Hold] olopatadine  1 drop Both Eyes BID   ondansetron       [MAR Hold] revefenacin  175 mcg Nebulization Daily   [MAR Hold] sevelamer carbonate  2.4 g Oral TID WC    [MAR Hold] acetaminophen, [MAR Hold] antiseptic oral rinse, chlorhexidine, [MAR Hold]  HYDROmorphone (DILAUDID) injection, [MAR Hold] melatonin, ondansetron, [MAR Hold] oxyCODONE, [MAR Hold] polyethylene glycol, [MAR Hold] prochlorperazine

## 2024-02-14 NOTE — Anesthesia Preprocedure Evaluation (Signed)
 Anesthesia Evaluation  Patient identified by MRN, date of birth, ID band Patient awake    Reviewed: Allergy & Precautions, NPO status , Patient's Chart, lab work & pertinent test results  Airway Mallampati: II  TM Distance: >3 FB Neck ROM: Full    Dental  (+) Dental Advisory Given   Pulmonary sleep apnea , COPD,  COPD inhaler and oxygen dependent, former smoker   breath sounds clear to auscultation       Cardiovascular hypertension, Pt. on medications + Peripheral Vascular Disease   Rhythm:Regular Rate:Normal     Neuro/Psych negative neurological ROS     GI/Hepatic negative GI ROS, Neg liver ROS,,,  Endo/Other    Renal/GU ESRF and DialysisRenal disease     Musculoskeletal  (+) Arthritis ,    Abdominal   Peds  Hematology  (+) Blood dyscrasia, anemia   Anesthesia Other Findings   Reproductive/Obstetrics                             Anesthesia Physical Anesthesia Plan  ASA: 3  Anesthesia Plan:    Post-op Pain Management:    Induction:   PONV Risk Score and Plan: 1  Airway Management Planned:   Additional Equipment:   Intra-op Plan:   Post-operative Plan:   Informed Consent:   Plan Discussed with:   Anesthesia Plan Comments:        Anesthesia Quick Evaluation

## 2024-02-14 NOTE — Anesthesia Postprocedure Evaluation (Signed)
 Anesthesia Post Note  Patient: Jeremy [name]  Procedure(s) Performed: OPEN REDUCTION INTERNAL FIXATION HIP (Right: Hip)     Patient location during evaluation: PACU Anesthesia Type: Spinal and MAC Level of consciousness: awake and alert Pain management: pain level controlled Vital Signs Assessment: post-procedure vital signs reviewed and stable Respiratory status: spontaneous breathing and respiratory function stable Cardiovascular status: blood pressure returned to baseline and stable Postop Assessment: spinal receding Anesthetic complications: no  No notable events documented.  Last Vitals:  Vitals:   02/14/24 1908 02/14/24 2011  BP: (!) 181/59 (!) 172/55  Pulse: 90 82  Resp: 17 18  Temp: 36.7 C 36.6 C  SpO2: 95% 94%    Last Pain:  Vitals:   02/14/24 2011  TempSrc: Oral  PainSc:                  Kennieth Rad

## 2024-02-14 NOTE — Progress Notes (Signed)
 ORTHOPAEDIC CONSULTATION  REQUESTING PHYSICIAN: Azucena Fallen, MD  Chief Complaint: Hip pain  HPI: Jeremy [name] is a 83 y.o. male who presents with right hip pain after a fall while trying to pay [place].  He does have a history of chronic kidney disease.  He is on oxygen at baseline.  He is quite dismayed as he was making an effort to be more active prior to this and was hopeful to get back to golf as he was not requiring any type of ambulatory assistive devices.  He was eventually seen in the urgent care and sent to [facility] for concern for femoral neck fracture  Past Medical History:  Diagnosis Date   Anemia    Cancer [facility])    patient and family have not been told that he has cancer.   Chronic kidney disease    dialysis T/Th/Sa   COPD (chronic obstructive pulmonary disease) (HCC)    Enlarged prostate    History of blood transfusion    Hyperlipidemia    Hypertension    Self-catheterizes urinary bladder    pt. does this morning and evening--been doing this since 11/2016   Sleep apnea    not using a cpap machine   Past Surgical History:  Procedure Laterality Date   A/V FISTULAGRAM Left 03/12/2023   Procedure: A/V Fistulagram;  Surgeon: Victorino Sparrow, MD;  Location: [facility] INVASIVE CV LAB;  Service: Cardiovascular;  Laterality: Left;   A/V FISTULAGRAM Left 10/31/2023   Procedure: A/V Fistulagram;  Surgeon: Ethelene Hal, MD;  Location: [facility] INVASIVE CV LAB;  Service: Cardiovascular;  Laterality: Left;   AV FISTULA PLACEMENT Left 10/16/2022   Procedure: LEFT BRACHIOCEPHALIC ARTERIOVENOUS (AV) FISTULA CREATION;  Surgeon: Cephus Shelling, MD;  Location: [facility] OR;  Service: Vascular;  Laterality: Left;   BACK SURGERY     BLADDER TUMOR EXCISION  2015   benign   BLEPHAROPLASTY  12/2018   LIGATION OF COMPETING BRANCHES OF ARTERIOVENOUS FISTULA Left 03/17/2023   Procedure: LEFT ARM FISTULA BRANCH LIGATION;  Surgeon: Victorino Sparrow, MD;  Location: [facility] OR;   Service: Vascular;  Laterality: Left;   LUMBAR LAMINECTOMY/DECOMPRESSION MICRODISCECTOMY Bilateral 08/25/2017   Procedure: Bilateral Lumbar Three- Four, Lumbar Four- Five, Lumbar Five- Sacral One Laminectomy;  Surgeon: Barnett Abu, MD;  Location: [facility] OR;  Service: Neurosurgery;  Laterality: Bilateral;  Bilateral L3-4 L4-5 L5-S1 Laminectomy   SKIN CANCER EXCISION     Surg x2...   TONSILLECTOMY AND ADENOIDECTOMY     Surg as a child...   TRANSURETHRAL RESECTION OF BLADDER TUMOR N/A 01/03/2022   Procedure: TRANSURETHRAL RESECTION OF BLADDER TUMOR (TURBT)/ CYSTOSCOPY/ EXAM UNDER ANESTHESIA, BILATERAL RETROGRADE/ BLADDER BIOPSIES;  Surgeon: Heloise Purpura, MD;  Location: [facility] ORS;  Service: Urology;  Laterality: N/A;  GENERAL ANESTHESIA WITH PARALYSIS   Social History   Socioeconomic History   Marital status: Married    Spouse name: Not on file   Number of children: Not on file   Years of education: Not on file   Highest education level: Not on file  Occupational History   Not on file  Tobacco Use   Smoking status: Former    Current packs/day: 0.00    Average packs/day: 0.2 packs/day for 61.5 years (12.3 ttl pk-yrs)    Types: Cigarettes    Start date: 12/29/1961    Quit date: 07/2023    Years since quitting: 0.5    Passive exposure: Never   Smokeless tobacco: Never  Tobacco comments:    Smokes 1/2 pack a day     Pt states he quit smoking 2 months ago. AB, CMA 10-07-23  Vaping Use   Vaping status: Never Used  Substance and Sexual Activity   Alcohol use: Not Currently    Comment: couple glasses with dinner daily   Drug use: No   Sexual activity: Not on file  Other Topics Concern   Not on file  Social History Narrative   Lives with wife in a 2 story home.  Has 2 children.  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)   Social Drivers of Health   Financial Resource Strain: Not on file  Food Insecurity: No Food Insecurity (02/13/2024)   Hunger Vital Sign    Worried About Running  Out of Food in the Last Year: Never true    Ran Out of Food in the Last Year: Never true  Transportation Needs: No Transportation Needs (02/13/2024)   PRAPARE - Administrator, Civil Service (Medical): No    Lack of Transportation (Non-Medical): No  Physical Activity: Not on file  Stress: Not on file  Social Connections: Socially Integrated (02/14/2024)   Social Connection and Isolation Panel [NHANES]    Frequency of Communication with Friends and Family: More than three times a week    Frequency of Social Gatherings with Friends and Family: More than three times a week    Attends Religious Services: 1 to 4 times per year    Active Member of [employer] or Organizations: Yes    Attends Engineer, structural: More than 4 times per year    Marital Status: Married   Family History  Problem Relation Age of Onset   Colon cancer Neg Hx    Stomach cancer Neg Hx    Rectal cancer Neg Hx    - negative except otherwise stated in the family history section Allergies  Allergen Reactions   Sulfa Antibiotics     Rash, light headed, shaking   Penicillins Other (See Comments)    hives severe as a child    Tape     Pulls skin off   Prior to Admission medications   Medication Sig Start Date End Date Taking? Authorizing Provider  antiseptic oral rinse (BIOTENE) LIQD 2 Applications by Mouth Rinse route as needed for dry mouth.   Yes [provider]  arformoterol (BROVANA) 15 MCG/2ML NEBU Substituted for: Brovana Neb Solution Inhale one vial in nebulizer twice a day. Patient taking differently: Take 15 mcg by nebulization in the morning and at bedtime. 02/09/24  Yes Glenford Bayley, NP  budesonide (PULMICORT) 0.5 MG/2ML nebulizer solution Generic for Pulmicort. Inhale one vial in nebulizer twice a day. Rinse mouth after use. Patient taking differently: Take 0.5 mg by nebulization in the morning and at bedtime. 02/09/24  Yes Glenford Bayley, NP  Cholecalciferol (VITAMIN D) 50  MCG (2000 UT) CAPS Take 2,000 Units by mouth daily. Crush and Mix with something   Yes [provider]  Dextromethorphan-guaiFENesin (MUCINEX DM PO) Take 20 mLs by mouth in the morning and at bedtime. Add to pudding or applesauce   Yes [provider]  ipratropium (ATROVENT) 0.03 % nasal spray Place 2 sprays into both nostrils 2 (two) times daily. 10/07/23  Yes Glenford Bayley, NP  Loperamide HCl (IMODIUM PO) Take 30 mLs by mouth as needed.   Yes [provider]  metoprolol tartrate (LOPRESSOR) 25 MG tablet Take 1 tablet (25 mg total)  by mouth 2 (two) times daily. 12/01/23  Yes Everrett Coombe, DO  Multiple Vitamin (MULTIVITAMIN) LIQD Take 5 mLs by mouth daily.   Yes [provider]  Olopatadine HCl (PATADAY OP) Place 2 drops into both eyes as needed.   Yes [provider]  OXYGEN Inhale 3 L into the lungs continuous.   Yes [provider]  revefenacin (YUPELRI) 175 MCG/3ML nebulizer solution Inhale one vial in nebulizer once daily. Do not mix with other nebulized medications. Patient taking differently: Take 175 mcg by nebulization daily. 02/09/24  Yes Glenford Bayley, NP  sevelamer carbonate (RENVELA) 0.8 g PACK packet Take 0.8 g by mouth daily as needed (snacks). 10/28/23  Yes [provider]  sevelamer carbonate (RENVELA) 2.4 g PACK Take 2.4 g by mouth 3 (three) times daily with meals.   Yes [provider]  AMBULATORY NON FORMULARY MEDICATION Please provide portable O2 concentrator for patient. Flow 2L/min, 24/7 use.  J96.10-Chronic respiratory failure. 09/12/23   Everrett Coombe, DO  AMBULATORY NON FORMULARY MEDICATION Please provide portable oxygen concentrator.  Flow 3L/min.  Dx: Chronic respiratory failure J96.11, COPD J42 09/22/23   Everrett Coombe, DO   DG CHEST PORT 1 VIEW Result Date: 02/13/2024 CLINICAL DATA:  Preop right hip rales EXAM: PORTABLE CHEST 1 VIEW COMPARISON:  01/14/2024, chest CT 01/26/2024, chest x-ray  09/22/2023 FINDINGS: Emphysema and chronic bronchitic changes. No acute airspace disease or pleural effusion. Upper normal cardiac silhouette. Aortic atherosclerosis. No pneumothorax IMPRESSION: No active disease. Emphysema and chronic bronchitic changes. Electronically Signed   By: Jasmine Pang M.D.   On: 02/13/2024 23:34   CT Hip Right Wo Contrast Result Date: 02/13/2024 CLINICAL DATA:  Right hip pain after fall. EXAM: CT OF THE RIGHT HIP WITHOUT CONTRAST TECHNIQUE: Multidetector CT imaging of the right hip was performed according to the standard protocol. Multiplanar CT image reconstructions were also generated. RADIATION DOSE REDUCTION: This exam was performed according to the departmental dose-optimization program which includes automated exposure control, adjustment of the mA and/or kV according to patient size and/or use of iterative reconstruction technique. COMPARISON:  Right hip radiographs dated 02/13/2024. FINDINGS: Bones/Joint/Cartilage Minimally impacted subcapital fracture of the right femoral head neck junction. Nondisplaced fracture of the right superior pubic ramus. The right femoral head is seated within the acetabulum. Mild degenerative changes of the right hip. The right sacroiliac joint appears intact. Degenerative changes of the visualized lower lumbar spine. Ligaments Ligaments are suboptimally evaluated by CT. Muscles and Tendons Fullness and heterogeneity of the right iliacus muscle, compatible with intramuscular hematoma. Soft tissue Soft tissue swelling along posterolateral hip. Aortoiliac atherosclerosis. IMPRESSION: 1. Minimally impacted subcapital fracture of the right femoral head and neck junction. 2. Nondisplaced fracture of the right superior pubic ramus. 3. Intramuscular hematoma within the right iliacus muscle. Electronically Signed   By: Hart Robinsons M.D.   On: 02/13/2024 20:09   DG Hip Unilat With Pelvis 2-3 Views Right Result Date: 02/13/2024 CLINICAL DATA:  Right hip  pain following a fall today. EXAM: DG HIP (WITH OR WITHOUT PELVIS) 2-3V RIGHT COMPARISON:  None Available. FINDINGS: Diffuse osteopenia. Probable minimally compressed right subcapital femoral neck fracture. Atheromatous arterial calcifications. Small amount of barium retained in distal descending colon diverticula. IMPRESSION: 1. Probable minimally compressed right subcapital femoral neck fracture. Recommend confirmation with a right hip CT without contrast. 2. Diffuse osteopenia. Electronically Signed   By: Beckie Salts M.D.   On: 02/13/2024 15:54     Positive ROS: All other systems have  been reviewed and were otherwise negative with the exception of those mentioned in the HPI and as above.  Physical Exam: General: No acute distress Cardiovascular: No pedal edema Respiratory: No cyanosis, no use of accessory musculature GI: No organomegaly, abdomen is soft and non-tender Skin: No lesions in the area of chief complaint Neurologic: Sensation intact distally Psychiatric: Patient is at baseline mood and affect Lymphatic: No axillary or cervical lymphadenopathy  MUSCULOSKELETAL:  Right hip with equal leg lengths.  Fires tibialis anterior as well as gastrocsoleus.  2+ dorsalis pedis pulse  Independent Imaging Review: X-rays 3 views right hip CT scan right hip: Impacted femoral neck fracture  Assessment: 83 year old male with right valgus impacted femoral neck fracture.  Overall I did discuss that I would recommend stabilization with screws given his impacted nature of the fracture.  I would allow for immediate weightbearing.  I did discuss that without this there would be concern and risk for displacement of the fracture.   Plan: Plan for right hip femoral neck cannulated screws placement   After a lengthy discussion of treatment options, including risks, benefits, alternatives, complications of surgical and nonsurgical conservative options, the patient elected surgical repair.   The  patient  is aware of the material risks  and complications including, but not limited to injury to adjacent structures, neurovascular injury, infection, numbness, bleeding, implant failure, thermal burns, stiffness, persistent pain, failure to heal, disease transmission from allograft, need for further surgery, dislocation, anesthetic risks, blood clots, risks of death,and others. The probabilities of surgical success and failure discussed with patient given their particular co-morbidities.The time and nature of expected rehabilitation and recovery was discussed.The patient's questions were all answered preoperatively.  No barriers to understanding were noted. I explained the natural history of the disease process and Rx rationale.  I explained to the patient what I considered to be reasonable expectations given their personal situation.  The final treatment plan was arrived at through a shared patient decision making process model.   Thank you for the consult and the opportunity to see Jeremy [name]  Huel Cote, MD [facility] 6:20 AM

## 2024-02-15 DIAGNOSIS — N186 End stage renal disease: Secondary | ICD-10-CM | POA: Diagnosis not present

## 2024-02-15 DIAGNOSIS — J9610 Chronic respiratory failure, unspecified whether with hypoxia or hypercapnia: Secondary | ICD-10-CM | POA: Diagnosis not present

## 2024-02-15 DIAGNOSIS — S7291XA Unspecified fracture of right femur, initial encounter for closed fracture: Secondary | ICD-10-CM | POA: Diagnosis not present

## 2024-02-15 DIAGNOSIS — J42 Unspecified chronic bronchitis: Secondary | ICD-10-CM | POA: Diagnosis not present

## 2024-02-15 LAB — CBC
HCT: 23.3 % — ABNORMAL LOW (ref 39.0–52.0)
Hemoglobin: 7.6 g/dL — ABNORMAL LOW (ref 13.0–17.0)
MCH: 32.5 pg (ref 26.0–34.0)
MCHC: 32.6 g/dL (ref 30.0–36.0)
MCV: 99.6 fL (ref 80.0–100.0)
Platelets: 218 10*3/uL (ref 150–400)
RBC: 2.34 MIL/uL — ABNORMAL LOW (ref 4.22–5.81)
RDW: 12.9 % (ref 11.5–15.5)
WBC: 9.2 10*3/uL (ref 4.0–10.5)
nRBC: 0 % (ref 0.0–0.2)

## 2024-02-15 LAB — BASIC METABOLIC PANEL WITH GFR
Anion gap: 12 (ref 5–15)
BUN: 27 mg/dL — ABNORMAL HIGH (ref 8–23)
CO2: 28 mmol/L (ref 22–32)
Calcium: 8.4 mg/dL — ABNORMAL LOW (ref 8.9–10.3)
Chloride: 95 mmol/L — ABNORMAL LOW (ref 98–111)
Creatinine, Ser: 4.58 mg/dL — ABNORMAL HIGH (ref 0.61–1.24)
GFR, Estimated: 12 mL/min — ABNORMAL LOW (ref >60)
Glucose, Bld: 93 mg/dL (ref 70–99)
Potassium: 4.8 mmol/L (ref 3.5–5.1)
Sodium: 135 mmol/L (ref 135–145)

## 2024-02-15 LAB — HEPATITIS B SURFACE ANTIBODY, QUANTITATIVE: Hep B S AB Quant (Post): <3.5 m[IU]/mL — ABNORMAL LOW

## 2024-02-15 NOTE — Plan of Care (Signed)
  Problem: Clinical Measurements: Goal: Ability to maintain clinical measurements within normal limits will improve Outcome: Progressing Goal: Will remain free from infection Outcome: Progressing   Problem: Nutrition: Goal: Adequate nutrition will be maintained Outcome: Progressing   Problem: Pain Managment: Goal: General experience of comfort will improve and/or be controlled Outcome: Progressing   Problem: Safety: Goal: Ability to remain free from injury will improve Outcome: Progressing   Problem: Skin Integrity: Goal: Risk for impaired skin integrity will decrease Outcome: Progressing

## 2024-02-15 NOTE — Plan of Care (Signed)
   Problem: Education: Goal: Knowledge of General Education information will improve Description Including pain rating scale, medication(s)/side effects and non-pharmacologic comfort measures Outcome: Progressing

## 2024-02-15 NOTE — Progress Notes (Signed)
 PROGRESS NOTE    Jeremy [name]  WUJ:[id number] DOB: 1941/01/29 DOA: 02/13/2024 PCP: Everrett Coombe, DO   Brief Narrative:  Jeremy [name] is a 83 y.o. male with medical history significant for COPD, on 3 L oxygen supplementation at baseline, OSA on CPAP, ESRD on HD TTS, chronic HFpEF, hyperlipidemia, history of bladder cancer, macular degeneration, who presents to the ER after a mechanical fall having tripped on a rug.  Imaging reported right femoral fracture, hospitalist called for admission, orthopedics called in consult.  Assessment & Plan:   Principal Problem:   Right femoral fracture (HCC)  Right femoral fracture, status post mechanical fall -Status post ORIF 4/ 5/25 with orthopedic surgery, Dr. 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 Previous baseline hemoglobin around 12 (although 7.5 3 months ago) 7.6 postoperatively -continue to follow, transfuse if symptomatic or hemoglobin less than 7   Leukocytosis, suspect reactive from femur fracture Patient does not meet sepsis criteria Likely reactive secondary to above Follow repeat labs   Chronic hypoxic respiratory failure on 3 L nasal cannula continuously COPD, not in acute exacerbation -Continue home regimen including Yupelri, Brovana, budesonide -Continue oxygen, titrate to 3 L which is his baseline   Chronic HFpEF -No acute exacerbation, appears euvolemic -Volume management per nephrology/dialysis   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   DVT prophylaxis: SCDs Start: 02/13/24 2114 Code Status:   Code Status: Full  Code Family Communication: None present  Status is: Inpatient  Dispo: The patient is from: Home              Anticipated d/c is to: To be determined              Anticipated d/c date is: 48 to 72 hours              Patient currently not medically stable for discharge given ongoing need for postsurgical care, PT evaluation and safe disposition  Consultants:  Nephrology, orthopedic surgery  Procedures:  ORIF 02/14/24  Antimicrobials:  Perioperatively  Subjective: No acute issues or events overnight denies nausea vomiting diarrhea constipation any fevers chills or chest pain.  Right hip pain and stiffness ongoing but improved.  Objective: Vitals:   02/14/24 1908 02/14/24 2011 02/14/24 2207 02/15/24 0500  BP: (!) 181/59 (!) 172/55  (!) 141/80  Pulse: 90 82 87 84  Resp: 17 18 16 20   Temp: 98 F (36.7 C) 97.8 F (36.6 C)  98.3 F (36.8 C)  TempSrc:  Oral  Oral  SpO2: 95% 94% 92% 92%  Weight: 72 kg     Height:        Intake/Output Summary (Last 24 hours) at 02/15/2024 0706 Last data filed at 02/14/2024 1908 Gross per 24 hour  Intake 400 ml  Output 10 ml  Net 390 ml   Filed Weights   02/14/24 0931 02/14/24 1508 02/14/24 1908  Weight: 71.2 kg 72 kg 72 kg    Examination:  General:  Pleasantly resting in bed, No acute distress. HEENT:  Normocephalic atraumatic.  Sclerae nonicteric, noninjected.  Extraocular movements intact bilaterally. Neck:  Without mass or deformity.  Trachea is midline. Lungs:  Clear to auscultate bilaterally without rhonchi, wheeze, or rales. Heart:  Regular rate and rhythm.  Without murmurs, rubs, or gallops. Abdomen:  Soft, nontender, nondistended.  Without guarding or rebound. Extremities: Without cyanosis, clubbing, edema  Data Reviewed: I have personally reviewed following labs and imaging studies  CBC: Recent Labs  Lab 02/13/24 1821 02/14/24 0523 02/15/24 0548  WBC 15.1* 10.1 9.2  HGB 8.9* 7.6* 7.6*  HCT 28.1* 24.4* 23.3*  MCV 102.6*  101.7* 99.6  PLT 272 248 218   Basic Metabolic Panel: Recent Labs  Lab 02/13/24 1821 02/14/24 0523  NA 141 142  K 4.6 5.1  CL 98 99  CO2 29 29  GLUCOSE 111* 91  BUN 42* 50*  CREATININE 6.27* 7.32*  CALCIUM 8.8* 8.6*  MG  --  2.2  PHOS  --  7.1*   GFR: Estimated Creatinine Clearance: 7.8 mL/min (A) (by C-G formula based on SCr of 7.32 mg/dL (H)). Liver Function Tests: Recent Labs  Lab 02/14/24 0523  ALBUMIN 3.1*   No results for input(s): "LIPASE", "AMYLASE" in the last 168 hours. No results for input(s): "AMMONIA" in the last 168 hours. Coagulation Profile: Recent Labs  Lab 02/14/24 0523  INR 1.2   Cardiac Enzymes: No results for input(s): "CKTOTAL", "CKMB", "CKMBINDEX", "TROPONINI" in the last 168 hours. BNP (last 3 results) No results for input(s): "PROBNP" in the last 8760 hours. HbA1C: No results for input(s): "HGBA1C" in the last 72 hours. CBG: No results for input(s): "GLUCAP" in the last 168 hours. Lipid Profile: No results for input(s): "CHOL", "HDL", "LDLCALC", "TRIG", "CHOLHDL", "LDLDIRECT" in the last 72 hours. Thyroid Function Tests: No results for input(s): "TSH", "T4TOTAL", "FREET4", "T3FREE", "THYROIDAB" in the last 72 hours. Anemia Panel: No results for input(s): "VITAMINB12", "FOLATE", "FERRITIN", "TIBC", "IRON", "RETICCTPCT" in the last 72 hours. Sepsis Labs: No results for input(s): "PROCALCITON", "LATICACIDVEN" in the last 168 hours.  Recent Results (from the past 240 hours)  Surgical pcr screen     Status: None   Collection Time: 02/14/24  1:19 AM   Specimen: Nasal Mucosa; Nasal Swab  Result Value Ref Range Status   MRSA, PCR NEGATIVE NEGATIVE Final   Staphylococcus aureus NEGATIVE NEGATIVE Final    Comment: (NOTE) The Xpert SA Assay (FDA approved for NASAL specimens in patients 66 years of age and older), is one component of a comprehensive surveillance program. It is not intended to diagnose infection nor to guide or monitor  treatment. Performed at [facility], [address], [place], [place] [address]          Radiology Studies: DG HIP UNILAT WITH PELVIS 2-3 VIEWS RIGHT Result Date: 02/14/2024 CLINICAL DATA:  Intraoperative evaluation.  ORIF of the right hip. EXAM: DG HIP (WITH OR WITHOUT PELVIS) 2-3V RIGHT COMPARISON:  Right hip radiograph dated 02/13/2024. FINDINGS: Multiple intraoperative fluoroscopic spot images are provided. Interval ORIF of a minimally impacted subcapital fracture of the right femoral head and neck junction with 3 partially threaded screws. IMPRESSION: Intraoperative fixation of a subcapital fracture of the right femoral head and neck junction. Electronically Signed   By: Hart Robinsons M.D.   On: 02/14/2024 14:14   DG C-Arm 1-60 Min-No Report Result Date: 02/14/2024 Fluoroscopy was utilized by the requesting physician.  No radiographic interpretation.   DG CHEST PORT 1 VIEW Result Date: 02/13/2024 CLINICAL DATA:  Preop right hip rales EXAM: PORTABLE CHEST 1 VIEW COMPARISON:  01/14/2024, chest CT 01/26/2024, chest x-ray 09/22/2023 FINDINGS: Emphysema and chronic bronchitic changes.  No acute airspace disease or pleural effusion. Upper normal cardiac silhouette. Aortic atherosclerosis. No pneumothorax IMPRESSION: No active disease. Emphysema and chronic bronchitic changes. Electronically Signed   By: Jasmine Pang M.D.   On: 02/13/2024 23:34   CT Hip Right Wo Contrast Result Date: 02/13/2024 CLINICAL DATA:  Right hip pain after fall. EXAM: CT OF THE RIGHT HIP WITHOUT CONTRAST TECHNIQUE: Multidetector CT imaging of the right hip was performed according to the standard protocol. Multiplanar CT image reconstructions were also generated. RADIATION DOSE REDUCTION: This exam was performed according to the departmental dose-optimization program which includes automated exposure control, adjustment of the mA and/or kV according to patient size and/or use of iterative reconstruction technique.  COMPARISON:  Right hip radiographs dated 02/13/2024. FINDINGS: Bones/Joint/Cartilage Minimally impacted subcapital fracture of the right femoral head neck junction. Nondisplaced fracture of the right superior pubic ramus. The right femoral head is seated within the acetabulum. Mild degenerative changes of the right hip. The right sacroiliac joint appears intact. Degenerative changes of the visualized lower lumbar spine. Ligaments Ligaments are suboptimally evaluated by CT. Muscles and Tendons Fullness and heterogeneity of the right iliacus muscle, compatible with intramuscular hematoma. Soft tissue Soft tissue swelling along posterolateral hip. Aortoiliac atherosclerosis. IMPRESSION: 1. Minimally impacted subcapital fracture of the right femoral head and neck junction. 2. Nondisplaced fracture of the right superior pubic ramus. 3. Intramuscular hematoma within the right iliacus muscle. Electronically Signed   By: Hart Robinsons M.D.   On: 02/13/2024 20:09   DG Hip Unilat With Pelvis 2-3 Views Right Result Date: 02/13/2024 CLINICAL DATA:  Right hip pain following a fall today. EXAM: DG HIP (WITH OR WITHOUT PELVIS) 2-3V RIGHT COMPARISON:  None Available. FINDINGS: Diffuse osteopenia. Probable minimally compressed right subcapital femoral neck fracture. Atheromatous arterial calcifications. Small amount of barium retained in distal descending colon diverticula. IMPRESSION: 1. Probable minimally compressed right subcapital femoral neck fracture. Recommend confirmation with a right hip CT without contrast. 2. Diffuse osteopenia. Electronically Signed   By: Beckie Salts M.D.   On: 02/13/2024 15:54        Scheduled Meds:  arformoterol  15 mcg Nebulization BID   aspirin  325 mg Oral Daily   budesonide  0.5 mg Nebulization BID   Chlorhexidine Gluconate Cloth  6 each Topical Q0600   metoprolol tartrate  12.5 mg Oral BID   olopatadine  1 drop Both Eyes BID   revefenacin  175 mcg Nebulization Daily    sevelamer carbonate  2.4 g Oral TID WC   Continuous Infusions:   LOS: 2 days   Time spent:  Azucena Fallen, DO [facility]  If 7PM-7AM, please contact night-coverage [email or web address]  02/15/2024, 7:06 AM

## 2024-02-15 NOTE — Evaluation (Signed)
 Physical Therapy Evaluation Patient Details Name: Jeremy [name] MRN: [id number] DOB: 1941-06-14 Today's Date: 02/15/2024  History of Present Illness  Patient is 83 y.o. male who presented to the ED on 02/14/24 after a mechanical fall from tripping on a rug. Work up revealed right femoral fracture, pt now s/p Rt LE ORIF 02/14/24.     PMH includes chronic respiratory failure, ESRD, tobacco abuse, OSA, HTN, HLD.   Clinical Impression  Jeremy [name] is 83 y.o. male admitted with above HPI and diagnosis. Patient is currently limited by functional impairments below (see PT problem list). Patient lives with spouse and is mod ind for household mobility at baseline and has intermittent assist from spouse and aide for home tasks, self care, and mobility. Currently pt limited by weakness, confusion, and pain. PT required Max assist for bed mobility and Mod assist for sit<>stand with RW from slightly elevated surface. Patient will benefit from continued skilled PT interventions to address impairments and progress independence with mobility. Patient will benefit from continued inpatient follow up therapy, <3 hours/day. Acute PT will follow and progress as able.         If plan is discharge home, recommend the following: Two people to help with walking and/or transfers;Two people to help with bathing/dressing/bathroom;Assist for transportation;Help with stairs or ramp for entrance;Supervision due to cognitive status;Direct supervision/assist for medications management;Assistance with cooking/housework   Can travel by private vehicle   No    Equipment Recommendations None recommended by PT (defer to next venue)  Recommendations for Other Services  OT consult    Functional Status Assessment Patient has had a recent decline in their functional status and demonstrates the ability to make significant improvements in function in a reasonable and predictable amount of time.     Precautions / Restrictions  Precautions Precautions: Fall Recall of Precautions/Restrictions: Impaired Restrictions Weight Bearing Restrictions Per Provider Order: Yes RLE Weight Bearing Per Provider Order: Weight bearing as tolerated      Mobility  Bed Mobility Overal bed mobility: Needs Assistance Bed Mobility: Supine to Sit, Sit to Supine     Supine to sit: Max assist, Used rails, HOB elevated Sit to supine: Max assist   General bed mobility comments: Max assist to being LE's off EOB and to raise trunk upright. pt able to hold balance in static sitting. Max assist to bring LE's ont bed to return to supine.    Transfers Overall transfer level: Needs assistance Equipment used: Rolling walker (2 wheels) Transfers: Sit to/from Stand, Bed to chair/wheelchair/BSC Sit to Stand: Mod assist, From elevated surface          Lateral/Scoot Transfers: Mod assist General transfer comment: Mod assist to power up, EOB elevated slightly, therapist blocking pt's LE's to prevent sliding and assist to lift hips from EOB. Pt unable to take small side step along EOB due to pain with WB on Rt LE. pt able to use bil UE and mod assist to scoot laterally to move towards HOB.    Ambulation/Gait                  Stairs            Wheelchair Mobility     Tilt Bed    Modified Rankin (Stroke Patients Only)       Balance Overall balance assessment: Needs assistance, History of Falls Sitting-balance support: Feet supported Sitting balance-Leahy Scale: Fair     Standing balance support: Bilateral upper extremity supported, During functional activity, Reliant  on assistive device for balance Standing balance-Leahy Scale: Poor                               Pertinent Vitals/Pain Pain Assessment Pain Assessment: Faces Faces Pain Scale: Hurts even more Pain Location: Rt LE with mobility Pain Descriptors / Indicators: Aching, Discomfort, Sore Pain Intervention(s): Limited activity within  patient's tolerance, Monitored during session, Repositioned    Home Living Family/patient expects to be discharged to:: Private residence Living Arrangements: Spouse/significant other Available Help at Discharge: Family;Available 24 hours/day Type of Home: House Home Access: Stairs to enter Entrance Stairs-Rails: Right Entrance Stairs-Number of Steps: 4 Alternate Level Stairs-Number of Steps: stair lift Home Layout: Multi-level;Bed/bath upstairs;Full bath on main level Home Equipment: Shower seat;Grab bars - tub/shower;Rolling Walker (2 wheels);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 pt has been going to pulmonary rehab in [place].    Prior Function Prior Level of Function : Independent/Modified Independent             Mobility Comments: ind with no AD and managing O2 lines with concentrator ind. only difficult mobility is standing up from bed, feet tend to slide on floor ADLs Comments: pt ind with dressing, occasionally wife helps with socks if fatigued from showering. pt gets in/out of shower himself. can toilet selt. wifes sets clothes out for him.     Extremity/Trunk Assessment   Upper Extremity Assessment Upper Extremity Assessment: Defer to OT evaluation;Generalized weakness    Lower Extremity Assessment Lower Extremity Assessment: Generalized weakness;RLE deficits/detail;Difficult to assess due to impaired cognition RLE: Unable to fully assess due to pain    Cervical / Trunk Assessment Cervical / Trunk Assessment: Kyphotic  Communication   Communication Communication: Impaired Factors Affecting Communication: Hearing impaired    Cognition Arousal: Alert Behavior During Therapy: WFL for tasks assessed/performed   PT - Cognitive impairments: Orientation, Awareness, Memory, Attention, Initiation, Sequencing, Problem solving, Safety/Judgement   Orientation  impairments: Place, Time, Situation                   PT - Cognition Comments: pt confused and unable to answer questions appropriately often laughing throughout. pt also HOH. Following commands: Impaired Following commands impaired: Follows one step commands inconsistently, Follows one step commands with increased time     Cueing Cueing Techniques: Verbal cues     General Comments      Exercises     Assessment/Plan    PT Assessment Patient needs continued PT services  PT Problem List Decreased strength;Decreased range of motion;Decreased activity tolerance;Decreased balance;Decreased mobility;Decreased coordination;Decreased knowledge of use of DME;Decreased cognition;Decreased safety awareness;Decreased knowledge of precautions;Pain       PT Treatment Interventions DME instruction;Gait training;Stair training;Functional mobility training;Therapeutic activities;Therapeutic exercise;Balance training;Neuromuscular re-education;Cognitive remediation;Patient/family education    PT Goals (Current goals can be found in the Care Plan section)  Acute Rehab PT Goals Patient Stated Goal: regain mobility and awareness PT Goal Formulation: With patient/family Time For Goal Achievement: 02/29/24 Potential to Achieve Goals: Good    Frequency Min 2X/week     Co-evaluation               AM-PAC PT "6 Clicks" Mobility  Outcome Measure Help needed turning from your back to your side while in a flat bed without using bedrails?: A Lot Help needed moving from lying on your back to sitting on the  side of a flat bed without using bedrails?: Total Help needed moving to and from a bed to a chair (including a wheelchair)?: A Lot Help needed standing up from a chair using your arms (e.g., wheelchair or bedside chair)?: A Lot Help needed to walk in hospital room?: Total Help needed climbing 3-5 steps with a railing? : Total 6 Click Score: 9    End of Session Equipment Utilized  During Treatment: Gait belt Activity Tolerance: Patient tolerated treatment well Patient left: in bed;with call bell/phone within reach;with bed alarm set;with family/visitor present (chair position) Nurse Communication: Mobility status;Other (comment) (need for suction due to secretions) PT Visit Diagnosis: Other abnormalities of gait and mobility (R26.89);Difficulty in walking, not elsewhere classified (R26.2);Pain;Other symptoms and signs involving the nervous system (R29.898);Unsteadiness on feet (R26.81);History of falling (Z91.81) Pain - Right/Left: Right Pain - part of body: Hip;Leg    Time: 4098-1191 PT Time Calculation (min) (ACUTE ONLY): 43 min   Charges:   PT Evaluation $PT Eval Moderate Complexity: 1 Mod PT Treatments $Therapeutic Activity: 23-37 mins PT General Charges $$ ACUTE PT VISIT: 1 Visit         Wynn Maudlin, DPT Acute Rehabilitation Services Office [phone]  02/15/24 6:29 PM

## 2024-02-15 NOTE — Progress Notes (Signed)
 Subjective: Seen in room with wife present, patient has no complaints pain under control, HD yesterday on schedule tolerated no UF  Objective Vital signs in last 24 hours: Vitals:   02/14/24 2207 02/15/24 0500 02/15/24 0747 02/15/24 0813  BP:  (!) 141/80 123/80   Pulse: 87 84 89 89  Resp: 16 20 16 15   Temp:  98.3 F (36.8 C) 98.1 F (36.7 C)   TempSrc:  Oral Oral   SpO2: 92% 92% 90% 92%  Weight:      Height:       Weight change: 0 kg  Physical Exam: General: Alert elderly male HOH NAD Heart: RRR no MRG Lungs: CTA bilaterally nonlabored breathing 3 L nasal cannula Abdomen: NABS, soft NTND Extremities: No pedal edema Dialysis Access: Positive bruit LUE AVF  Renal-related home meds: Renvela 2.4 g AC 3 times daily Home oxygen 3 L continuous Metoprolol 12.5 mg twice daily Others: Revefenacin nebulizer, eyedrops, Pulmicort, Brovana    OP HD: SW TTS As of oct 2024 --> 3h 73.3kg   B400    2K bath  Heparin 2500 AVF NO ESA  2ndary to  ho Bladder CA   Admitting CXR - IMPRESSION: No active disease. Emphysema and chronic bronchitic changes.  Problem/Plan:  R hip fracture: sp R hip surgery 4/05 today. Per orthopedics ESRD: on HD TTS.  HD yest.4/05 on schedule  HTN: bp's stable, cont home metoprolol.  Volume: not vol overloaded on exam, CXR clear from 4/04. Min UF.  Anemia of esrd: Hb 7.6-infuse for hemoglobin under 7,NO ESA sec Bladder Ca  Secondary hyperparathyroidism: CCa in range, phos 7.1. Cont Renvela binder w/ meals.  COPD: on 3 L [place] home O2   Lenny Pastel, PA-C [facility] Beeper [phone] 02/15/2024,12:01 PM  LOS: 2 days   Labs: Basic Metabolic Panel: Recent Labs  Lab 02/13/24 1821 02/14/24 0523 02/15/24 0548  NA 141 142 135  K 4.6 5.1 4.8  CL 98 99 95*  CO2 29 29 28   GLUCOSE 111* 91 93  BUN 42* 50* 27*  CREATININE 6.27* 7.32* 4.58*  CALCIUM 8.8* 8.6* 8.4*  PHOS  --  7.1*  --    Liver Function Tests: Recent Labs  Lab  02/14/24 0523  ALBUMIN 3.1*   No results for input(s): "LIPASE", "AMYLASE" in the last 168 hours. No results for input(s): "AMMONIA" in the last 168 hours. CBC: Recent Labs  Lab 02/13/24 1821 02/14/24 0523 02/15/24 0548  WBC 15.1* 10.1 9.2  HGB 8.9* 7.6* 7.6*  HCT 28.1* 24.4* 23.3*  MCV 102.6* 101.7* 99.6  PLT 272 248 218   Cardiac Enzymes: No results for input(s): "CKTOTAL", "CKMB", "CKMBINDEX", "TROPONINI" in the last 168 hours. CBG: No results for input(s): "GLUCAP" in the last 168 hours.  Studies/Results: DG HIP UNILAT WITH PELVIS 2-3 VIEWS RIGHT Result Date: 02/14/2024 CLINICAL DATA:  Intraoperative evaluation.  ORIF of the right hip. EXAM: DG HIP (WITH OR WITHOUT PELVIS) 2-3V RIGHT COMPARISON:  Right hip radiograph dated 02/13/2024. FINDINGS: Multiple intraoperative fluoroscopic spot images are provided. Interval ORIF of a minimally impacted subcapital fracture of the right femoral head and neck junction with 3 partially threaded screws. IMPRESSION: Intraoperative fixation of a subcapital fracture of the right femoral head and neck junction. Electronically Signed   By: Hart Robinsons M.D.   On: 02/14/2024 14:14   DG C-Arm 1-60 Min-No Report Result Date: 02/14/2024 Fluoroscopy was utilized by the requesting physician.  No radiographic interpretation.   DG CHEST PORT 1  VIEW Result Date: 02/13/2024 CLINICAL DATA:  Preop right hip rales EXAM: PORTABLE CHEST 1 VIEW COMPARISON:  01/14/2024, chest CT 01/26/2024, chest x-ray 09/22/2023 FINDINGS: Emphysema and chronic bronchitic changes. No acute airspace disease or pleural effusion. Upper normal cardiac silhouette. Aortic atherosclerosis. No pneumothorax IMPRESSION: No active disease. Emphysema and chronic bronchitic changes. Electronically Signed   By: Jasmine Pang M.D.   On: 02/13/2024 23:34   CT Hip Right Wo Contrast Result Date: 02/13/2024 CLINICAL DATA:  Right hip pain after fall. EXAM: CT OF THE RIGHT HIP WITHOUT CONTRAST  TECHNIQUE: Multidetector CT imaging of the right hip was performed according to the standard protocol. Multiplanar CT image reconstructions were also generated. RADIATION DOSE REDUCTION: This exam was performed according to the departmental dose-optimization program which includes automated exposure control, adjustment of the mA and/or kV according to patient size and/or use of iterative reconstruction technique. COMPARISON:  Right hip radiographs dated 02/13/2024. FINDINGS: Bones/Joint/Cartilage Minimally impacted subcapital fracture of the right femoral head neck junction. Nondisplaced fracture of the right superior pubic ramus. The right femoral head is seated within the acetabulum. Mild degenerative changes of the right hip. The right sacroiliac joint appears intact. Degenerative changes of the visualized lower lumbar spine. Ligaments Ligaments are suboptimally evaluated by CT. Muscles and Tendons Fullness and heterogeneity of the right iliacus muscle, compatible with intramuscular hematoma. Soft tissue Soft tissue swelling along posterolateral hip. Aortoiliac atherosclerosis. IMPRESSION: 1. Minimally impacted subcapital fracture of the right femoral head and neck junction. 2. Nondisplaced fracture of the right superior pubic ramus. 3. Intramuscular hematoma within the right iliacus muscle. Electronically Signed   By: Hart Robinsons M.D.   On: 02/13/2024 20:09   DG Hip Unilat With Pelvis 2-3 Views Right Result Date: 02/13/2024 CLINICAL DATA:  Right hip pain following a fall today. EXAM: DG HIP (WITH OR WITHOUT PELVIS) 2-3V RIGHT COMPARISON:  None Available. FINDINGS: Diffuse osteopenia. Probable minimally compressed right subcapital femoral neck fracture. Atheromatous arterial calcifications. Small amount of barium retained in distal descending colon diverticula. IMPRESSION: 1. Probable minimally compressed right subcapital femoral neck fracture. Recommend confirmation with a right hip CT without contrast.  2. Diffuse osteopenia. Electronically Signed   By: Beckie Salts M.D.   On: 02/13/2024 15:54   Medications:   arformoterol  15 mcg Nebulization BID   aspirin  325 mg Oral Daily   budesonide  0.5 mg Nebulization BID   Chlorhexidine Gluconate Cloth  6 each Topical Q0600   metoprolol tartrate  12.5 mg Oral BID   olopatadine  1 drop Both Eyes BID   revefenacin  175 mcg Nebulization Daily   sevelamer carbonate  2.4 g Oral TID WC

## 2024-02-15 NOTE — Progress Notes (Signed)
   Subjective:  Patient reports pain as mild.Tolerating diet. Pending PT eval.  Objective:   VITALS:   Vitals:   02/14/24 1908 02/14/24 2011 02/14/24 2207 02/15/24 0500  BP: (!) 181/59 (!) 172/55  (!) 141/80  Pulse: 90 82 87 84  Resp: 17 18 16 20   Temp: 98 F (36.7 C) 97.8 F (36.6 C)  98.3 F (36.8 C)  TempSrc:  Oral  Oral  SpO2: 95% 94% 92% 92%  Weight: 72 kg     Height:        Right hip dressing is CDI. SILT right foot in all distributions. Fires EHL/TA/GS  Lab Results  Component Value Date   WBC 9.2 02/15/2024   HGB 7.6 (L) 02/15/2024   HCT 23.3 (L) 02/15/2024   MCV 99.6 02/15/2024   PLT 218 02/15/2024     Assessment/Plan:  1 Day Post-Op status post right 3 screws placement doing well  - Expected postop acute blood loss anemia - will monitor for symptoms - Patient to work with PT to optimize mobilization safely - DVT ppx - SCDs, ambulation, Aspirin 325 daily - Postoperative Abx: Ancef x 2 additional doses given - WBAT operative extremity - Pain control - multimodal pain management, ATC acetaminophen in conjunction with as needed narcotic (oxycodone), although this should be minimized with other modalities  Alicya Bena 02/15/2024, 7:29 AM

## 2024-02-16 ENCOUNTER — Encounter

## 2024-02-16 ENCOUNTER — Encounter: Payer: Self-pay | Admitting: *Deleted

## 2024-02-16 ENCOUNTER — Other Ambulatory Visit: Payer: Self-pay

## 2024-02-16 ENCOUNTER — Encounter (HOSPITAL_COMMUNITY): Payer: Self-pay | Admitting: Orthopaedic Surgery

## 2024-02-16 ENCOUNTER — Inpatient Hospital Stay (HOSPITAL_COMMUNITY)

## 2024-02-16 DIAGNOSIS — J42 Unspecified chronic bronchitis: Secondary | ICD-10-CM | POA: Diagnosis not present

## 2024-02-16 DIAGNOSIS — J9610 Chronic respiratory failure, unspecified whether with hypoxia or hypercapnia: Secondary | ICD-10-CM | POA: Diagnosis not present

## 2024-02-16 DIAGNOSIS — S7291XA Unspecified fracture of right femur, initial encounter for closed fracture: Secondary | ICD-10-CM | POA: Diagnosis not present

## 2024-02-16 DIAGNOSIS — J449 Chronic obstructive pulmonary disease, unspecified: Secondary | ICD-10-CM

## 2024-02-16 DIAGNOSIS — R918 Other nonspecific abnormal finding of lung field: Secondary | ICD-10-CM

## 2024-02-16 DIAGNOSIS — N186 End stage renal disease: Secondary | ICD-10-CM | POA: Diagnosis not present

## 2024-02-16 MED ORDER — OXYCODONE HCL 5 MG PO TABS: 5.0000 mg | ORAL_TABLET | ORAL | 0 refills | Status: DC | PRN

## 2024-02-16 NOTE — Evaluation (Signed)
 Occupational Therapy Evaluation Patient Details Name: Jeremy [name] MRN: [id number] DOB: December 28, 1940 Today's Date: 02/16/2024   History of Present Illness   Patient is 83 y.o. male who presented to the ED on 02/14/24 after a mechanical fall from tripping on a rug. Work up revealed right femoral fracture, pt now s/p Rt LE ORIF 02/14/24.     PMH includes chronic respiratory failure, ESRD, tobacco abuse, OSA, HTN, HLD.     Clinical Impressions Pt admitted based on above, and was seen based on problem list below. PTA pt was living with his wife, and receiving assistance from a caregiver 5 days a week. PTA he was receiving receiving min to mod assistance with ADLs.Today pt is requiring set up  to mod for ADLs. Bed mobility was min assist and functional transfers are  mod assist. Pt required heavy cues for sequencing and safety. Per caregiver pt is typically A & O x4, but has had an increase in confusion since admission. Recommendation of <3 hours of skilled rehab daily to optimize independence levels. OT will continue to follow acutely to maximize functional independence.        If plan is discharge home, recommend the following:   A lot of help with walking and/or transfers;A lot of help with bathing/dressing/bathroom;Assistance with cooking/housework;Direct supervision/assist for medications management;Supervision due to cognitive status     Functional Status Assessment   Patient has had a recent decline in their functional status and demonstrates the ability to make significant improvements in function in a reasonable and predictable amount of time.     Equipment Recommendations   Other (comment) (Defer to next venue)      Precautions/Restrictions   Precautions Precautions: Fall Recall of Precautions/Restrictions: Impaired Restrictions Weight Bearing Restrictions Per Provider Order: Yes RLE Weight Bearing Per Provider Order: Weight bearing as tolerated     Mobility Bed  Mobility Overal bed mobility: Needs Assistance Bed Mobility: Supine to Sit     Supine to sit: Min assist, HOB elevated, Used rails     General bed mobility comments: Min assist for RLE    Transfers Overall transfer level: Needs assistance Equipment used: Rolling walker (2 wheels) Transfers: Sit to/from Stand, Bed to chair/wheelchair/BSC Sit to Stand: Mod assist, From elevated surface Stand pivot transfers: Mod assist, From elevated surface         General transfer comment: Heavy cues fro sequencing, and management of RW      Balance Overall balance assessment: Needs assistance, History of Falls Sitting-balance support: Feet supported, No upper extremity supported Sitting balance-Leahy Scale: Fair     Standing balance support: Bilateral upper extremity supported, During functional activity, Reliant on assistive device for balance Standing balance-Leahy Scale: Poor Standing balance comment: Reliant on RW         ADL either performed or assessed with clinical judgement   ADL Overall ADL's : Needs assistance/impaired Eating/Feeding: Set up;Sitting   Grooming: Wash/dry face;Set up;Sitting           Upper Body Dressing : Set up;Sitting   Lower Body Dressing: Moderate assistance;Sit to/from stand Lower Body Dressing Details (indicate cue type and reason): Pt able to cross legs for socks, would be mod assist for pants d/t balance Toilet Transfer: Moderate assistance;Cueing for sequencing;Cueing for safety;Stand-pivot;Rolling walker (2 wheels) Toilet Transfer Details (indicate cue type and reason): Heavy cueing for sequencing, simulated in room         Functional mobility during ADLs: Moderate assistance;Rolling walker (2 wheels);Cueing for safety;Cueing for sequencing  Vision Baseline Vision/History: 0 No visual deficits Vision Assessment?: No apparent visual deficits            Pertinent Vitals/Pain Pain Assessment Pain Assessment: Faces Faces Pain  Scale: Hurts little more Pain Location: Rt LE with mobility Pain Descriptors / Indicators: Aching, Discomfort, Sore Pain Intervention(s): Monitored during session, Repositioned, Premedicated before session     Extremity/Trunk Assessment Upper Extremity Assessment Upper Extremity Assessment: Generalized weakness;Right hand dominant   Lower Extremity Assessment Lower Extremity Assessment: Defer to PT evaluation   Cervical / Trunk Assessment Cervical / Trunk Assessment: Kyphotic   Communication Communication Communication: Impaired Factors Affecting Communication: Hearing impaired   Cognition Arousal: Alert Behavior During Therapy: WFL for tasks assessed/performed Cognition: Cognition impaired     Awareness: Intellectual awareness intact, Online awareness impaired     Executive functioning impairment (select all impairments): Sequencing, Problem solving, Reasoning OT - Cognition Comments: Per caregiver, pt typically A & O x4 with slight memory issues. Per caregiver more confused since hospital stay         Following commands: Impaired Following commands impaired: Follows one step commands inconsistently, Follows one step commands with increased time     Cueing  General Comments   Cueing Techniques: Verbal cues;Tactile cues  VSS on 4L of O2, caregiver present for session           Home Living Family/patient expects to be discharged to:: Private residence Living Arrangements: Spouse/significant other Available Help at Discharge: Family;Available 24 hours/day Type of Home: House Home Access: Stairs to enter [employer] of Steps: 4 Entrance Stairs-Rails: Right Home Layout: Able to live on main level with bedroom/bathroom;Multi-level Alternate Level Stairs-Number of Steps: stair lift   Bathroom Shower/Tub: Producer, television/film/video: Standard Bathroom Accessibility: Yes (No w/c) How Accessible: Accessible via walker Home Equipment: Shower  seat;Grab bars - tub/shower;Rolling Walker (2 wheels);Wheelchair - Sport and exercise psychologist Comments: has an aide M-F 9am-5pm and she assists with bathing and driving him to appointments. pt has been going to pulmonary rehab in [place].      Prior Functioning/Environment Prior Level of Function : Independent/Modified Independent       Mobility Comments: ind with no AD and managing O2 lines with concentrator ind. only difficult mobility is standing up from bed, feet tend to slide on floor ADLs Comments: Min Assist with LB dressing    OT Problem List: Decreased strength;Decreased range of motion;Decreased activity tolerance;Impaired balance (sitting and/or standing);Decreased safety awareness;Decreased cognition;Decreased knowledge of use of DME or AE;Decreased knowledge of precautions   OT Treatment/Interventions: Self-care/ADL training;Therapeutic exercise;Energy conservation;DME and/or AE instruction;Therapeutic activities;Patient/family education;Balance training      OT Goals(Current goals can be found in the care plan section)   Acute Rehab OT Goals Patient Stated Goal: None stated OT Goal Formulation: With patient Time For Goal Achievement: 03/01/24 Potential to Achieve Goals: Good   OT Frequency:  Min 2X/week       AM-PAC OT "6 Clicks" Daily Activity     Outcome Measure Help from another person eating meals?: None Help from another person taking care of personal grooming?: A Little Help from another person toileting, which includes using toliet, bedpan, or urinal?: A Lot Help from another person bathing (including washing, rinsing, drying)?: A Lot Help from another person to put on and taking off regular upper body clothing?: A Little Help from another person to put on and taking off regular lower body clothing?: A Lot 6 Click Score: 16   End of  Session Equipment Utilized During Treatment: Gait belt;Rolling walker (2 wheels) Nurse Communication: Mobility  status  Activity Tolerance: Patient tolerated treatment well Patient left: in chair;with call bell/phone within reach;with chair alarm set;with family/visitor present  OT Visit Diagnosis: Unsteadiness on feet (R26.81);Other abnormalities of gait and mobility (R26.89);Repeated falls (R29.6);Muscle weakness (generalized) (M62.81)                Time: 0981-1914 OT Time Calculation (min): 26 min Charges:  OT General Charges $OT Visit: 1 Visit OT Evaluation $OT Eval Moderate Complexity: 1 Mod  Ivor Messier, OT  Acute Rehabilitation Services Office [phone] Secure chat preferred   Marilynne Drivers 02/16/2024, 12:40 PM

## 2024-02-16 NOTE — Progress Notes (Signed)
 Pulmonary Individual Treatment Plan  Patient Details  Name: DARRIK RICHMAN MRN: [id number] Date of Birth: February 06, 1941 Referring Provider:   Flowsheet Row Pulmonary Rehab from 01/19/2024 in [facility] Cardiac and Pulmonary Rehab  Referring Provider Dr. Virl Diamond, MD       Initial Encounter Date:  Flowsheet Row Pulmonary Rehab from 01/19/2024 in [facility] Cardiac and Pulmonary Rehab  Date 01/19/24       Visit Diagnosis: Chronic obstructive pulmonary disease, unspecified COPD type (HCC)  Patient's Home Medications on Admission: No current facility-administered medications for this visit.  Current Outpatient Medications:    oxyCODONE (ROXICODONE) 5 MG immediate release tablet, Take 1 tablet (5 mg total) by mouth every 4 (four) hours as needed for severe pain (pain score 7-10) or breakthrough pain., Disp: 30 tablet, Rfl: 0  Facility-Administered Medications Ordered in Other Visits:    acetaminophen (TYLENOL) tablet 650 mg, 650 mg, Oral, Q6H PRN, Huel Cote, MD, 650 mg at 02/14/24 2235   antiseptic oral rinse (BIOTENE) solution 30 mL, 2 Application, Mouth Rinse, PRN, Huel Cote, MD   arformoterol (BROVANA) nebulizer solution 15 mcg, 15 mcg, Nebulization, BID, Huel Cote, MD, 15 mcg at 02/16/24 0830   aspirin tablet 325 mg, 325 mg, Oral, Daily, Huel Cote, MD, 325 mg at 02/15/24 0907   budesonide (PULMICORT) nebulizer solution 0.5 mg, 0.5 mg, Nebulization, BID, Huel Cote, MD, 0.5 mg at 02/16/24 0830   Chlorhexidine Gluconate Cloth 2 % PADS 6 each, 6 each, Topical, Q0600, Delano Metz, MD, 6 each at 02/15/24 0529   melatonin tablet 5 mg, 5 mg, Oral, QHS PRN, Huel Cote, MD, 5 mg at 02/15/24 2052   metoprolol tartrate (LOPRESSOR) tablet 12.5 mg, 12.5 mg, Oral, BID, Huel Cote, MD, 12.5 mg at 02/15/24 2053   olopatadine (PATANOL) 0.1 % ophthalmic solution 1 drop, 1 drop, Both Eyes, BID, Huel Cote, MD, 1 drop at 02/15/24 2054   oxyCODONE (Oxy  IR/ROXICODONE) immediate release tablet 5 mg, 5 mg, Oral, Q6H PRN, Huel Cote, MD, 5 mg at 02/15/24 2052   polyethylene glycol (MIRALAX / GLYCOLAX) packet 17 g, 17 g, Oral, Daily PRN, Huel Cote, MD   prochlorperazine (COMPAZINE) injection 5 mg, 5 mg, Intravenous, Q6H PRN, Huel Cote, MD   revefenacin (YUPELRI) nebulizer solution 175 mcg, 175 mcg, Nebulization, Daily, Huel Cote, MD, 175 mcg at 02/16/24 7829   sevelamer carbonate (RENVELA) powder PACK 2.4 g, 2.4 g, Oral, TID WC, Huel Cote, MD, 2.4 g at 02/16/24 5621  Past Medical History: Past Medical History:  Diagnosis Date   Anemia    Cancer [facility])    patient and family have not been told that he has cancer.   COPD (chronic obstructive pulmonary disease) (HCC)    Enlarged prostate    ESRD on hemodialysis (HCC)    TTS   History of blood transfusion    Hyperlipidemia    Hypertension    Self-catheterizes urinary bladder    pt. does this morning and evening--been doing this since 11/2016   Sleep apnea    not using a cpap machine    Tobacco Use: Social History   Tobacco Use  Smoking Status Former   Current packs/day: 0.00   Average packs/day: 0.2 packs/day for 61.5 years (12.3 ttl pk-yrs)   Types: Cigarettes   Start date: 12/29/1961   Quit date: 07/2023   Years since quitting: 0.5   Passive exposure: Never  Smokeless Tobacco Never  Tobacco Comments   Smokes 1/2 pack a day    Pt states  he quit smoking 2 months ago. AB, CMA 10-07-23    Labs: Review Flowsheet  More data exists      Latest Ref Rng & Units 03/17/2023 08/10/2023 08/14/2023 08/15/2023 10/31/2023  Labs for ITP Cardiac and Pulmonary Rehab  Hemoglobin A1c 4.8 - 5.6 % - 5.5  - - -  PH, Arterial 7.35 - 7.45 - 7.221  7.36  7.31  -  PCO2 arterial 32 - 48 mmHg - 76.3  51  61  -  Bicarbonate 20.0 - 28.0 mmol/L - 24.0  31.3  32.6  28.7  30.7  -  TCO2 22 - 32 mmol/L 29  34  35  - - 32   Acid-base deficit 0.0 - 2.0 mmol/L - 0.2  - - -  O2  Saturation % - 92.6  99  61  93.4  94.7  -    Details       Multiple values from one day are sorted in reverse-chronological order          Pulmonary Assessment Scores:   UCSD: Self-administered rating of dyspnea associated with activities of daily living (ADLs) 6-point scale (0 = "not at all" to 5 = "maximal or unable to do because of breathlessness")  Scoring Scores range from 0 to 120.  Minimally important difference is 5 units  CAT: CAT can identify the health impairment of COPD patients and is better correlated with disease progression.  CAT has a scoring range of zero to 40. The CAT score is classified into four groups of low (less than 10), medium (10 - 20), high (21-30) and very high (31-40) based on the impact level of disease on health status. A CAT score over 10 suggests significant symptoms.  A worsening CAT score could be explained by an exacerbation, poor medication adherence, poor inhaler technique, or progression of COPD or comorbid conditions.  CAT MCID is 2 points  mMRC: mMRC (Modified Medical Research Council) Dyspnea Scale is used to assess the degree of baseline functional disability in patients of respiratory disease due to dyspnea. No minimal important difference is established. A decrease in score of 1 point or greater is considered a positive change.   Pulmonary Function Assessment:  Pulmonary Function Assessment - 01/19/24 1607       Pulmonary Function Tests   FVC% 77 %    FEV1% 64 %    FEV1/FVC Ratio 59   Date of PFT: 01/07/2024            Exercise Target Goals: Exercise Program Goal: Individual exercise prescription set using results from initial 6 min walk test and THRR while considering  patient's activity barriers and safety.   Exercise Prescription Goal: Initial exercise prescription builds to 30-45 minutes a day of aerobic activity, 2-3 days per week.  Home exercise guidelines will be given to patient during program as part of exercise  prescription that the participant will acknowledge.  Education: Aerobic Exercise: - Group verbal and visual presentation on the components of exercise prescription. Introduces F.I.T.T principle from ACSM for exercise prescriptions.  Reviews F.I.T.T. principles of aerobic exercise including progression. Written material given at graduation.   Education: Resistance Exercise: - Group verbal and visual presentation on the components of exercise prescription. Introduces F.I.T.T principle from ACSM for exercise prescriptions  Reviews F.I.T.T. principles of resistance exercise including progression. Written material given at graduation.    Education: Exercise & Equipment Safety: - Individual verbal instruction and demonstration of equipment use and safety with use of the equipment.  Flowsheet Row Pulmonary Rehab from 01/19/2024 in [facility] Cardiac and Pulmonary Rehab  Date 01/19/24  Educator NT  Instruction Review Code 1- Verbalizes Understanding       Education: Exercise Physiology & General Exercise Guidelines: - Group verbal and written instruction with models to review the exercise physiology of the cardiovascular system and associated critical values. Provides general exercise guidelines with specific guidelines to those with heart or lung disease.    Education: Flexibility, Balance, Mind/Body Relaxation: - Group verbal and visual presentation with interactive activity on the components of exercise prescription. Introduces F.I.T.T principle from ACSM for exercise prescriptions. Reviews F.I.T.T. principles of flexibility and balance exercise training including progression. Also discusses the mind body connection.  Reviews various relaxation techniques to help reduce and manage stress (i.e. Deep breathing, progressive muscle relaxation, and visualization). Balance handout provided to take home. Written material given at graduation.   Activity Barriers & Risk Stratification:  Activity Barriers &  Cardiac Risk Stratification - 01/19/24 1556       Activity Barriers & Cardiac Risk Stratification   Activity Barriers Balance Concerns;Deconditioning;Shortness of Breath;Back Problems             6 Minute Walk:  6 Minute Walk     Row Name 01/19/24 1552         6 Minute Walk   Phase Initial     Distance 670 feet     Walk Time 4.33 minutes     # of Rest Breaks 1     MPH 1.76     METS 1.3     RPE 11     Perceived Dyspnea  0     VO2 Peak 4.55     Symptoms Yes (comment)     Comments fatigue     Resting HR 74 bpm     Resting BP 148/66     Resting Oxygen Saturation  96 %     Exercise Oxygen Saturation  during 6 min walk 87 %     Max Ex. HR 85 bpm     Max Ex. BP 150/64     2 Minute Post BP 132/66       Interval HR   1 Minute HR 79     2 Minute HR 85     3 Minute HR 84     4 Minute HR 80     5 Minute HR 80     6 Minute HR 83     2 Minute Post HR 69     Interval Heart Rate? Yes       Interval Oxygen   Interval Oxygen? Yes     Baseline Oxygen Saturation % 96 %     1 Minute Oxygen Saturation % 90 %     1 Minute Liters of Oxygen 3 L     2 Minute Oxygen Saturation % 91 %     2 Minute Liters of Oxygen 3 L     3 Minute Oxygen Saturation % 88 %     3 Minute Liters of Oxygen 3 L     4 Minute Oxygen Saturation % 88 %     4 Minute Liters of Oxygen 3 L     5 Minute Oxygen Saturation % 88 %     5 Minute Liters of Oxygen 3 L     6 Minute Oxygen Saturation % 87 %     6 Minute Liters of Oxygen 3 L     2 Minute Post  Oxygen Saturation % 93 %     2 Minute Post Liters of Oxygen 3 L             Oxygen Initial Assessment:  Oxygen Initial Assessment - 02/02/24 1645       Home Oxygen   Home Oxygen Device Home Concentrator    Sleep Oxygen Prescription Continuous    Liters per minute 3    Home Exercise Oxygen Prescription Continuous    Liters per minute 3    Home Resting Oxygen Prescription Continuous    Liters per minute 3    Compliance with Home Oxygen Use Yes       Intervention   Short Term Goals To learn and demonstrate proper pursed lip breathing techniques or other breathing techniques.     Long  Term Goals --             Oxygen Re-Evaluation:   Oxygen Discharge (Final Oxygen Re-Evaluation):   Initial Exercise Prescription:  Initial Exercise Prescription - 01/19/24 1600       Date of Initial Exercise RX and Referring Provider   Date 01/19/24    Referring Provider Dr. Virl Diamond, MD      Oxygen   Oxygen Continuous    Liters 3    Maintain Oxygen Saturation 88% or higher      Recumbant Bike   Level 1    RPM 50    Watts 15    Minutes 15    METs 1.3      NuStep   Level 1    SPM 80    Minutes 15    METs 1.3      Track   Laps 15    Minutes 15    METs 1.82      Prescription Details   Frequency (times per week) 3    Duration Progress to 30 minutes of continuous aerobic without signs/symptoms of physical distress      Intensity   THRR 40-80% of Max Heartrate 99-124    Ratings of Perceived Exertion 11-13    Perceived Dyspnea 0-4      Progression   Progression Continue to progress workloads to maintain intensity without signs/symptoms of physical distress.      Resistance Training   Training Prescription Yes    Weight 3 lb    Reps 10-15             Perform Capillary Blood Glucose checks as needed.  Exercise Prescription Changes:   Exercise Prescription Changes     Row Name 01/19/24 1600 02/11/24 0700           Response to Exercise   Blood Pressure (Admit) 148/66 136/60      Blood Pressure (Exercise) 150/64 148/60      Blood Pressure (Exit) 132/66 116/62      Heart Rate (Admit) 74 bpm 77 bpm      Heart Rate (Exercise) 85 bpm 81 bpm      Heart Rate (Exit) 69 bpm 64 bpm      Oxygen Saturation (Admit) 96 % 90 %      Oxygen Saturation (Exercise) 87 % 89 %      Oxygen Saturation (Exit) 93 % 96 %      Rating of Perceived Exertion (Exercise) 11 11      Perceived Dyspnea (Exercise) 0 0       Symptoms fatigue none      Comments Results First week of exercise sessions  Duration -- Continue with 30 min of aerobic exercise without signs/symptoms of physical distress.      Intensity -- THRR unchanged        Progression   Progression -- Continue to progress workloads to maintain intensity without signs/symptoms of physical distress.      Average METs -- 2.25        Resistance Training   Training Prescription -- Yes      Weight -- 3 lb      Reps -- 10-15        Interval Training   Interval Training -- No        Recumbant Bike   Level -- 2      Watts -- 15      Minutes -- 15      METs -- 2.66        NuStep   Level -- 1      Minutes -- 15      METs -- 1.9        Oxygen   Maintain Oxygen Saturation -- 88% or higher               Exercise Comments:   Exercise Comments     Row Name 02/02/24 1644           Exercise Comments First full day of exercise!  Patient was oriented to gym and equipment including functions, settings, policies, and procedures.  Patient's individual exercise prescription and treatment plan were reviewed.  All starting workloads were established based on the results of the 6 minute walk test done at initial orientation visit.  The plan for exercise progression was also introduced and progression will be customized based on patient's performance and goals.                Exercise Goals and Review:   Exercise Goals     Row Name 01/19/24 1556             Exercise Goals   Increase Strength and Stamina Yes       Intervention Provide advice, education, support and counseling about physical activity/exercise needs.;Develop an individualized exercise prescription for aerobic and resistive training based on initial evaluation findings, risk stratification, comorbidities and participant's personal goals.       Expected Outcomes Short Term: Increase workloads from initial exercise prescription for resistance, speed, and METs.;Short  Term: Perform resistance training exercises routinely during rehab and add in resistance training at home;Long Term: Improve cardiorespiratory fitness, muscular endurance and strength as measured by increased METs and functional capacity ( )       Able to understand and use rate of perceived exertion (RPE) scale Yes       Intervention Provide education and explanation on how to use RPE scale       Expected Outcomes Short Term: Able to use RPE daily in rehab to express subjective intensity level;Long Term:  Able to use RPE to guide intensity level when exercising independently       Able to understand and use Dyspnea scale Yes       Intervention Provide education and explanation on how to use Dyspnea scale       Expected Outcomes Short Term: Able to use Dyspnea scale daily in rehab to express subjective sense of shortness of breath during exertion;Long Term: Able to use Dyspnea scale to guide intensity level when exercising independently       Knowledge and understanding of Target Heart Rate Range (THRR) Yes  Intervention Provide education and explanation of THRR including how the numbers were predicted and where they are located for reference       Expected Outcomes Short Term: Able to use daily as guideline for intensity in rehab;Long Term: Able to use THRR to govern intensity when exercising independently;Short Term: Able to state/look up THRR       Able to check pulse independently Yes       Intervention Provide education and demonstration on how to check pulse in carotid and radial arteries.;Review the importance of being able to check your own pulse for safety during independent exercise       Expected Outcomes Long Term: Able to check pulse independently and accurately;Short Term: Able to explain why pulse checking is important during independent exercise       Understanding of Exercise Prescription Yes       Intervention Provide education, explanation, and written materials on patient's  individual exercise prescription       Expected Outcomes Short Term: Able to explain program exercise prescription;Long Term: Able to explain home exercise prescription to exercise independently                Exercise Goals Re-Evaluation :  Exercise Goals Re-Evaluation     Row Name 02/02/24 1644 02/11/24 0756           Exercise Goal Re-Evaluation   Exercise Goals Review Able to understand and use rate of perceived exertion (RPE) scale;Able to understand and use Dyspnea scale;Knowledge and understanding of Target Heart Rate Range (THRR);Understanding of Exercise Prescription Increase Physical Activity;Understanding of Exercise Prescription;Increase Strength and Stamina      Comments Reviewed RPE and dyspnea scale, THR and program prescription with pt today.  Pt voiced understanding and was given a copy of goals to take home. Oliver is off to a good start in the program. He has completed his first 2 exercise sessions in this review, and has tolerated his exercise program well. He increased to level 2 on the recumbent bike and did well with level 1 on the T4 nustep. We will continue to monitor his progress in the program.      Expected Outcomes Short: Use RPE daily to regulate intensity. Long: Follow program prescription in THR. Short: Continue to follow current exercise prescription. Long: Continue exercise to improve strength and stamina.               Discharge Exercise Prescription (Final Exercise Prescription Changes):  Exercise Prescription Changes - 02/11/24 0700       Response to Exercise   Blood Pressure (Admit) 136/60    Blood Pressure (Exercise) 148/60    Blood Pressure (Exit) 116/62    Heart Rate (Admit) 77 bpm    Heart Rate (Exercise) 81 bpm    Heart Rate (Exit) 64 bpm    Oxygen Saturation (Admit) 90 %    Oxygen Saturation (Exercise) 89 %    Oxygen Saturation (Exit) 96 %    Rating of Perceived Exertion (Exercise) 11    Perceived Dyspnea (Exercise) 0    Symptoms  none    Comments First week of exercise sessions    Duration Continue with 30 min of aerobic exercise without signs/symptoms of physical distress.    Intensity THRR unchanged      Progression   Progression Continue to progress workloads to maintain intensity without signs/symptoms of physical distress.    Average METs 2.25      Resistance Training   Training Prescription Yes  Weight 3 lb    Reps 10-15      Interval Training   Interval Training No      Recumbant Bike   Level 2    Watts 15    Minutes 15    METs 2.66      NuStep   Level 1    Minutes 15    METs 1.9      Oxygen   Maintain Oxygen Saturation 88% or higher             Nutrition:  Target Goals: Understanding of nutrition guidelines, daily intake of sodium 1500mg , cholesterol 200mg , calories 30% from fat and 7% or less from saturated fats, daily to have 5 or more servings of fruits and vegetables.  Education: All About Nutrition: -Group instruction provided by verbal, written material, interactive activities, discussions, models, and posters to present general guidelines for heart healthy nutrition including fat, fiber, MyPlate, the role of sodium in heart healthy nutrition, utilization of the nutrition label, and utilization of this knowledge for meal planning. Follow up email sent as well. Written material given at graduation.   Biometrics:  Pre Biometrics - 01/19/24 1556       Pre Biometrics   Height 5' 8.5" (1.74 m)    Weight 160 lb 6.4 oz (72.8 kg)    BMI (Calculated) 24.03    Single Leg Stand 0 seconds              Nutrition Therapy Plan and Nutrition Goals:  Nutrition Therapy & Goals - 01/19/24 1543       Nutrition Therapy   RD appointment deferred Yes      Intervention Plan   Intervention Prescribe, educate and counsel regarding individualized specific dietary modifications aiming towards targeted core components such as weight, hypertension, lipid management, diabetes, heart  failure and other comorbidities.    Expected Outcomes Short Term Goal: A plan has been developed with personal nutrition goals set during dietitian appointment.;Short Term Goal: Understand basic principles of dietary content, such as calories, fat, sodium, cholesterol and nutrients.             Nutrition Assessments:  MEDIFICTS Score Key: >=70 Need to make dietary changes  40-70 Heart Healthy Diet <= 40 Therapeutic Level Cholesterol Diet   Picture Your Plate Scores: <16 Unhealthy dietary pattern with much room for improvement. 41-50 Dietary pattern unlikely to meet recommendations for good health and room for improvement. 51-60 More healthful dietary pattern, with some room for improvement.  >60 Healthy dietary pattern, although there may be some specific behaviors that could be improved.   Nutrition Goals Re-Evaluation:   Nutrition Goals Discharge (Final Nutrition Goals Re-Evaluation):   Psychosocial: Target Goals: Acknowledge presence or absence of significant depression and/or stress, maximize coping skills, provide positive support system. Participant is able to verbalize types and ability to use techniques and skills needed for reducing stress and depression.   Education: Stress, Anxiety, and Depression - Group verbal and visual presentation to define topics covered.  Reviews how body is impacted by stress, anxiety, and depression.  Also discusses healthy ways to reduce stress and to treat/manage anxiety and depression.  Written material given at graduation.   Education: Sleep Hygiene -Provides group verbal and written instruction about how sleep can affect your health.  Define sleep hygiene, discuss sleep cycles and impact of sleep habits. Review good sleep hygiene tips.    Initial Review & Psychosocial Screening:  Initial Psych Review & Screening - 01/14/24 1137  Initial Review   Current issues with Current Stress Concerns    Source of Stress Concerns Unable  to participate in former interests or hobbies;Unable to perform yard/household activities;Chronic Illness      Family Dynamics   Good Support System? Yes   wife, family     Barriers   Psychosocial barriers to participate in program There are no identifiable barriers or psychosocial needs.;The patient should benefit from training in stress management and relaxation.      Screening Interventions   Interventions Encouraged to exercise;Provide feedback about the scores to participant;To provide support and resources with identified psychosocial needs    Expected Outcomes Short Term goal: Utilizing psychosocial counselor, staff and physician to assist with identification of specific Stressors or current issues interfering with healing process. Setting desired goal for each stressor or current issue identified.;Long Term Goal: Stressors or current issues are controlled or eliminated.;Short Term goal: Identification and review with participant of any Quality of Life or Depression concerns found by scoring the questionnaire.;Long Term goal: The participant improves quality of Life and PHQ9 Scores as seen by post scores and/or verbalization of changes             Quality of Life Scores:  Scores of 19 and below usually indicate a poorer quality of life in these areas.  A difference of  2-3 points is a clinically meaningful difference.  A difference of 2-3 points in the total score of the Quality of Life Index has been associated with significant improvement in overall quality of life, self-image, physical symptoms, and general health in studies assessing change in quality of life.  PHQ-9: Review Flowsheet  More data exists      01/19/2024 05/19/2023 04/23/2022 01/08/2022 08/10/2020  Depression screen PHQ 2/9  Decreased Interest 1 0 0 2 0  Down, Depressed, Hopeless 1 0 0 2 0  PHQ - 2 Score 2 0 0 4 0  Altered sleeping 1 - - - 0  Tired, decreased energy 1 - - - 0  Change in appetite 0 - - - 0  Feeling  bad or failure about yourself  0 - - - 0  Trouble concentrating 1 - - - 0  Moving slowly or fidgety/restless 0 - - - 0  Suicidal thoughts 0 - - - 0  PHQ-9 Score 5 - - - 0  Difficult doing work/chores Not difficult at all - - - -   Interpretation of Total Score  Total Score Depression Severity:  1-4 = Minimal depression, 5-9 = Mild depression, 10-14 = Moderate depression, 15-19 = Moderately severe depression, 20-27 = Severe depression   Psychosocial Evaluation and Intervention:  Psychosocial Evaluation - 01/14/24 1152       Psychosocial Evaluation & Interventions   Interventions Encouraged to exercise with the program and follow exercise prescription;Stress management education;Relaxation education    Comments Mr. Tesar is coming to pulmonary rehab with COPD. His wife notes that his stamina, breathing, and strength have gotten worse in the last year. They do have an aide that comes to help them around the house and transportation. He states he doesn't feel short of breath a lot, but knows that he is a mouth breather. He wears his oxygen as he should, but they have been having issues getting the correct canister sizes and it has been stressful trying to figure it out. He wants to get back to golfing and enjoys being outside. When asked about stress, he mentions he is getting old and not able  to do what he once was, but that this program sounds like it will be helpful for him.    Expected Outcomes Short: attend pulmonary rehab for education and exercise. Long: develop and maintain positive self care habits    Continue Psychosocial Services  Follow up required by staff             Psychosocial Re-Evaluation:   Psychosocial Discharge (Final Psychosocial Re-Evaluation):   Education: Education Goals: Education classes will be provided on a weekly basis, covering required topics. Participant will state understanding/return demonstration of topics presented.  Learning  Barriers/Preferences:  Learning Barriers/Preferences - 01/14/24 1139       Learning Barriers/Preferences   Learning Barriers None    Learning Preferences Individual Instruction             General Pulmonary Education Topics:  Infection Prevention: - Provides verbal and written material to individual with discussion of infection control including proper hand washing and proper equipment cleaning during exercise session. Flowsheet Row Pulmonary Rehab from 01/19/2024 in [facility]  Date 01/19/24  Educator NT  Instruction Review Code 1- Verbalizes Understanding       Falls Prevention: - Provides verbal and written material to individual with discussion of falls prevention and safety. Flowsheet Row Pulmonary Rehab from 01/19/2024 in [facility] Card[facility] Pulmonary Rehab  Date 01/19/24  Educator NT  Instruction Review Code 1- Verbalizes Understanding       Chronic Lung Disease Review: - Group verbal instruction with posters, models, PowerPoint presentations and videos,  to review new updates, new respiratory medications, new advancements in procedures and treatments. Providing information on websites and "800" numbers for continued self-education. Includes information about supplement oxygen, available portable oxygen systems, continuous and intermittent flow rates, oxygen safety, concentrators, and Medicare reimbursement for oxygen. Explanation of Pulmonary Drugs, including class, frequency, complications, importance of spacers, rinsing mouth after steroid MDI's, and proper cleaning methods for nebulizers. Review of basic lung anatomy and physiology related to function, structure, and complications of lung disease. Review of risk factors. Discussion about methods for diagnosing sleep apnea and types of masks and machines for OSA. Includes a review of the use of types of environmental controls: home humidity, furnaces, filters, dust mite/pet prevention, HEPA vacuums.  Discussion about weather changes, air quality and the benefits of nasal washing. Instruction on Warning signs, infection symptoms, calling MD promptly, preventive modes, and value of vaccinations. Review of effective airway clearance, coughing and/or vibration techniques. Emphasizing that all should Create an Action Plan. Written material given at graduation.   AED/CPR: - Group verbal and written instruction with the use of models to demonstrate the basic use of the AED with the basic ABC's of resuscitation.    Anatomy and Cardiac Procedures: - Group verbal and visual presentation and models provide information about basic cardiac anatomy and function. Reviews the testing methods done to diagnose heart disease and the outcomes of the test results. Describes the treatment choices: Medical Management, Angioplasty, or Coronary Bypass Surgery for treating various heart conditions including Myocardial Infarction, Angina, Valve Disease, and Cardiac Arrhythmias.  Written material given at graduation.   Medication Safety: - Group verbal and visual instruction to review commonly prescribed medications for heart and lung disease. Reviews the medication, class of the drug, and side effects. Includes the steps to properly store meds and maintain the prescription regimen.  Written material given at graduation.   Other: -Provides group and verbal instruction on various topics (see comments)   Knowledge Questionnaire  Score:    Core Components/Risk Factors/Patient Goals at Admission:  Personal Goals and Risk Factors at Admission - 01/14/24 1135       Core Components/Risk Factors/Patient Goals on Admission   Improve shortness of breath with ADL's Yes    Intervention Provide education, individualized exercise plan and daily activity instruction to help decrease symptoms of SOB with activities of daily living.    Expected Outcomes Short Term: Improve cardiorespiratory fitness to achieve a reduction of  symptoms when performing ADLs;Long Term: Be able to perform more ADLs without symptoms or delay the onset of symptoms    Hypertension Yes    Intervention Provide education on lifestyle modifcations including regular physical activity/exercise, weight management, moderate sodium restriction and increased consumption of fresh fruit, vegetables, and low fat dairy, alcohol moderation, and smoking cessation.;Monitor prescription use compliance.    Expected Outcomes Short Term: Continued assessment and intervention until BP is < 140/51mm HG in hypertensive participants. < 130/65mm HG in hypertensive participants with diabetes, heart failure or chronic kidney disease.;Long Term: Maintenance of blood pressure at goal levels.    Lipids Yes    Intervention Provide education and support for participant on nutrition & aerobic/resistive exercise along with prescribed medications to achieve LDL 70mg , HDL >40mg .    Expected Outcomes Short Term: Participant states understanding of desired cholesterol values and is compliant with medications prescribed. Participant is following exercise prescription and nutrition guidelines.;Long Term: Cholesterol controlled with medications as prescribed, with individualized exercise RX and with personalized nutrition plan. Value goals: LDL < 70mg , HDL > 40 mg.             Education:Diabetes - Individual verbal and written instruction to review signs/symptoms of diabetes, desired ranges of glucose level fasting, after meals and with exercise. Acknowledge that pre and post exercise glucose checks will be done for 3 sessions at entry of program.   Know Your Numbers and Heart Failure: - Group verbal and visual instruction to discuss disease risk factors for cardiac and pulmonary disease and treatment options.  Reviews associated critical values for Overweight/Obesity, Hypertension, Cholesterol, and Diabetes.  Discusses basics of heart failure: signs/symptoms and treatments.   Introduces Heart Failure Zone chart for action plan for heart failure.  Written material given at graduation.   Core Components/Risk Factors/Patient Goals Review:    Core Components/Risk Factors/Patient Goals at Discharge (Final Review):    ITP Comments:  ITP Comments     Row Name 01/14/24 1147 01/19/24 1528 01/21/24 0812 02/02/24 1644 02/16/24 0945   ITP Comments Initial phone call completed. Diagnosis can be found in [facility] 2/26. EP Orientation scheduled for Monday 3/10 at 10:30. Completed and gym orientation. Initial ITP created and sent for review to Dr. Jinny Sanders, Medical Director. 30 Day review completed. Medical Director ITP review done, changes made as directed, and signed approval by Medical Director. New to program First full day of exercise!  Patient was oriented to gym and equipment including functions, settings, policies, and procedures.  Patient's individual exercise prescription and treatment plan were reviewed.  All starting workloads were established based on the results of the 6 minute walk test done at initial orientation visit.  The plan for exercise progression was also introduced and progression will be customized based on patient's performance and goals. Discharged Larey Seat broke hip.  Surgery and PT next.    Will readmit once ready            Comments: Discharge ITP  Fell broke hip. Surgery and PT next.  Will readmit once ready

## 2024-02-16 NOTE — Progress Notes (Signed)
 Pt. Wife states pt. Does not like to wear cpap and is taking a break from it due to some spots that were recently removed on the pt. Nose.

## 2024-02-16 NOTE — Progress Notes (Signed)
 Pt receives out-pt HD at [facility] on TTS 12:35  pm chair time. Info provided to CSW for d/c planning purposes. Will assist as needed.   Olivia Canter Renal Navigator [phone]

## 2024-02-16 NOTE — Progress Notes (Signed)
 PROGRESS NOTE    Jeremy [name]  ZOX:[id number] DOB: July 05, 1941 DOA: 02/13/2024 PCP: Everrett Coombe, DO   Brief Narrative:  Jeremy [name] is a 83 y.o. male with medical history significant for COPD, on 3 L oxygen supplementation at baseline, OSA on CPAP, ESRD on HD TTS, chronic HFpEF, hyperlipidemia, history of bladder cancer, macular degeneration, who presents to the ER after a mechanical fall having tripped on a rug.  Imaging reported right femoral fracture, hospitalist called for admission, orthopedics called in consult.  Assessment & Plan:   Principal Problem:   Right femoral fracture (HCC)  Right femoral fracture, status post mechanical fall -Status post ORIF 4/ 5/25 with orthopedic surgery, Dr. 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 Previous baseline hemoglobin around 12 (although 7.5 three months ago) 7.6 postoperatively -continue to follow, transfuse if symptomatic or hemoglobin less than 7   Leukocytosis, suspect reactive from femur fracture Patient does not meet sepsis criteria Likely reactive secondary to above Follow repeat labs   Chronic hypoxic respiratory failure on 3 L nasal cannula continuously COPD, not in acute exacerbation -Continue home regimen including Yupelri, Brovana, budesonide -Continue oxygen, titrate to 3 L which is his baseline   Chronic HFpEF -No acute exacerbation, appears euvolemic -Volume management per nephrology/dialysis   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   DVT prophylaxis: SCDs Start: 02/13/24 2114 Code Status:   Code Status:  Full Code Family Communication: None present  Status is: Inpatient  Dispo: The patient is from: Home              Anticipated d/c is to: To be determined              Anticipated d/c date is: 48 to 72 hours              Patient currently not medically stable for discharge given ongoing need for postsurgical care, PT evaluation and safe disposition  Consultants:  Nephrology, orthopedic surgery  Procedures:  ORIF 02/14/24  Antimicrobials:  Perioperatively  Subjective: No acute issues or events overnight denies nausea vomiting diarrhea constipation any fevers chills or chest pain.  Right hip pain and stiffness ongoing but improved.  Objective: Vitals:   02/15/24 1701 02/15/24 1948 02/15/24 2025 02/16/24 0417  BP: (!) 140/63 (!) 147/66  (!) 158/57  Pulse: 85 80 80 73  Resp: 19 18 18 16   Temp: 98 F (36.7 C) 98.7 F (37.1 C)  99.8 F (37.7 C)  TempSrc: Axillary Axillary  Oral  SpO2: 93% 95% 95% 94%  Weight:      Height:        Intake/Output Summary (Last 24 hours) at 02/16/2024 0723 Last data filed at 02/15/2024 1702 Gross per 24 hour  Intake 120 ml  Output --  Net 120 ml   Filed Weights   02/14/24 0931 02/14/24 1508 02/14/24 1908  Weight: 71.2 kg 72 kg 72 kg    Examination:  General:  Pleasantly resting in bed, No acute distress. HEENT:  Normocephalic atraumatic.  Sclerae nonicteric, noninjected.  Extraocular movements intact bilaterally. Neck:  Without mass or deformity.  Trachea is midline. Lungs:  Clear  to auscultate bilaterally without rhonchi, wheeze, or rales. Heart:  Regular rate and rhythm.  Without murmurs, rubs, or gallops. Abdomen:  Soft, nontender, nondistended.  Without guarding or rebound. Extremities: Without cyanosis, clubbing, edema  Data Reviewed: I have personally reviewed following labs and imaging studies  CBC: Recent Labs  Lab 02/13/24 1821 02/14/24 0523 02/15/24 0548  WBC 15.1* 10.1 9.2  HGB 8.9* 7.6* 7.6*  HCT 28.1* 24.4* 23.3*  MCV  102.6* 101.7* 99.6  PLT 272 248 218   Basic Metabolic Panel: Recent Labs  Lab 02/13/24 1821 02/14/24 0523 02/15/24 0548  NA 141 142 135  K 4.6 5.1 4.8  CL 98 99 95*  CO2 29 29 28   GLUCOSE 111* 91 93  BUN 42* 50* 27*  CREATININE 6.27* 7.32* 4.58*  CALCIUM 8.8* 8.6* 8.4*  MG  --  2.2  --   PHOS  --  7.1*  --    GFR: Estimated Creatinine Clearance: 12.4 mL/min (A) (by C-G formula based on SCr of 4.58 mg/dL (H)). Liver Function Tests: Recent Labs  Lab 02/14/24 0523  ALBUMIN 3.1*   No results for input(s): "LIPASE", "AMYLASE" in the last 168 hours. No results for input(s): "AMMONIA" in the last 168 hours. Coagulation Profile: Recent Labs  Lab 02/14/24 0523  INR 1.2   Cardiac Enzymes: No results for input(s): "CKTOTAL", "CKMB", "CKMBINDEX", "TROPONINI" in the last 168 hours. BNP (last 3 results) No results for input(s): "PROBNP" in the last 8760 hours. HbA1C: No results for input(s): "HGBA1C" in the last 72 hours. CBG: No results for input(s): "GLUCAP" in the last 168 hours. Lipid Profile: No results for input(s): "CHOL", "HDL", "LDLCALC", "TRIG", "CHOLHDL", "LDLDIRECT" in the last 72 hours. Thyroid Function Tests: No results for input(s): "TSH", "T4TOTAL", "FREET4", "T3FREE", "THYROIDAB" in the last 72 hours. Anemia Panel: No results for input(s): "VITAMINB12", "FOLATE", "FERRITIN", "TIBC", "IRON", "RETICCTPCT" in the last 72 hours. Sepsis Labs: No results for input(s): "PROCALCITON", "LATICACIDVEN" in the last 168 hours.  Recent Results (from the past 240 hours)  Surgical pcr screen     Status: None   Collection Time: 02/14/24  1:19 AM   Specimen: Nasal Mucosa; Nasal Swab  Result Value Ref Range Status   MRSA, PCR NEGATIVE NEGATIVE Final   Staphylococcus aureus NEGATIVE NEGATIVE Final    Comment: (NOTE) The Xpert SA Assay (FDA approved for NASAL specimens in patients 33 years of age and older), is one component of a comprehensive surveillance program. It is  not intended to diagnose infection nor to guide or monitor treatment. Performed at [facility], [address], [place], [place] [address]          Radiology Studies: DG HIP UNILAT WITH PELVIS 2-3 VIEWS RIGHT Result Date: 02/14/2024 CLINICAL DATA:  Intraoperative evaluation.  ORIF of the right hip. EXAM: DG HIP (WITH OR WITHOUT PELVIS) 2-3V RIGHT COMPARISON:  Right hip radiograph dated 02/13/2024. FINDINGS: Multiple intraoperative fluoroscopic spot images are provided. Interval ORIF of a minimally impacted subcapital fracture of the right femoral head and neck junction with 3 partially threaded screws. IMPRESSION: Intraoperative fixation of a subcapital fracture of the right femoral head and neck junction. Electronically Signed   By: Hart Robinsons M.D.   On: 02/14/2024 14:14   DG C-Arm 1-60 Min-No Report Result Date: 02/14/2024 Fluoroscopy was utilized by the requesting physician.  No radiographic interpretation.        Scheduled Meds:  arformoterol  15 mcg Nebulization BID   aspirin  325 mg  Oral Daily   budesonide  0.5 mg Nebulization BID   Chlorhexidine Gluconate Cloth  6 each Topical Q0600   metoprolol tartrate  12.5 mg Oral BID   olopatadine  1 drop Both Eyes BID   revefenacin  175 mcg Nebulization Daily   sevelamer carbonate  2.4 g Oral TID WC   Continuous Infusions:   LOS: 3 days   Time spent:  Azucena Fallen, DO [facility]  If 7PM-7AM, please contact night-coverage [email or web address]  02/16/2024, 7:23 AM

## 2024-02-16 NOTE — TOC Initial Note (Signed)
 Transition of Care [facility]) - Initial/Assessment Note    Patient Details  Name: Jeremy [name] MRN: [id number] Date of Birth: July 17, 1941  [facility] CM/SW Contact:    Lorri Frederick, LCSW Phone Number: 02/16/2024, 2:07 PM  Clinical Narrative:      Pt listed as oriented x3 but not really able to engage in conversation about DC planning.  HH aide Georgiann Hahn present in room, CSW spoke with her and wife [name] on speakerphone.  Wife asking about CIR.  If no CIR, wife is agreeable to SNF.  Permission given to send out referral in hub.  Medicare choice document provided to [facility], wife will be by later to review.  Pt from home with wife, [facility] aide present daily.       CSW sent message to [facility] admissions team. Referral also sent out in hub for SNF.   Expected Discharge Plan: Skilled Nursing Facility Barriers to Discharge: Continued Medical Work up, SNF Pending bed offer   Patient Goals and CMS Choice   CMS Medicare.gov Compare Post Acute Care list provided to:: Patient Represenative (must comment) (wife) Choice offered to / list presented to : Spouse      Expected Discharge Plan and Services In-house Referral: Clinical Social Work   Post Acute Care Choice: Skilled Nursing Facility Living arrangements for the past 2 months: Single Family Home                                      Prior Living Arrangements/Services Living arrangements for the past 2 months: Single Family Home Lives with:: Spouse Patient language and need for interpreter reviewed:: Yes        Need for Family Participation in Patient Care: Yes (Comment) Care giver support system in place?: Yes (comment) Current home services: Homehealth aide (daily HH aide) Criminal Activity/Legal Involvement Pertinent to Current Situation/Hospitalization: No - Comment as needed  Activities of Daily Living   ADL Screening (condition at time of admission) Independently performs ADLs?: Yes (appropriate for developmental  age) Is the patient deaf or have difficulty hearing?: Yes Does the patient have difficulty seeing, even when wearing glasses/contacts?: Yes Does the patient have difficulty concentrating, remembering, or making decisions?: Yes  Permission Sought/Granted                  Emotional Assessment Appearance:: Appears stated age Attitude/Demeanor/Rapport: Unable to Assess Affect (typically observed): Unable to Assess Orientation: : Oriented to Self, Oriented to Place, Oriented to Situation      Admission diagnosis:  Elevated blood pressure reading [R03.0] ESRD on dialysis (HCC) [N18.6, Z99.2] Chronic respiratory failure with hypoxia (HCC) [J96.11] Right femoral fracture (HCC) [S72.91XA] Multiple skin tears [T14.8XXA] Essential hypertension [I10] Fall from slip, trip, or stumble, initial encounter [W01.0XXA] Nondisplaced fracture of base of neck of right femur, initial encounter for closed fracture (HCC) [S72.044A] Chronic obstructive pulmonary disease, unspecified COPD type (HCC) [J44.9] Closed fracture of superior ramus of right pubis, initial encounter (HCC) [S32.511A] Patient Active Problem List   Diagnosis Date Noted   Right femoral fracture (HCC) 02/13/2024   Malnutrition of moderate degree 08/13/2023   Hypoxia 08/12/2023   Chronic respiratory failure with hypoxia (HCC) 08/12/2023   Other disorders of phosphorus metabolism 06/25/2023   Malignant neoplasm of bladder, unspecified (HCC) 01/06/2023   ESRD (end stage renal disease) (HCC) 10/24/2022   Gait instability 10/24/2022   Neck pain on right side 04/23/2022  Intermediate stage nonexudative age-related macular degeneration of left eye 08/17/2020   Pseudophakia 08/17/2020   Degenerative retinal drusen of left eye 08/17/2020   Advanced nonexudative age-related macular degeneration of right eye with subfoveal involvement 08/17/2020   Senile purpura (HCC) 08/10/2020   Aortic atherosclerosis (HCC) 08/10/2020   Cerumen  impaction 08/10/2020   Skin lesion 07/06/2019   Hyperlipidemia 01/05/2019   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   Cigarette smoker 03/17/2018   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   OSA (obstructive sleep apnea) 05/23/2016   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   Osteoarthritis 01/29/2010   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   History of bladder cancer 10/10/2008   Chronic bronchitis (HCC) 10/10/2008   Diverticulosis of large intestine 10/10/2008   COLONIC POLYPS 12/07/2007   Essential hypertension 12/07/2007   Hemorrhoids 12/07/2007   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   PCP:  Everrett Coombe, DO Pharmacy:   [id number] [place], [place] - [address] AT [facility] OF [address] [place] [address] Phone: [phone] Fax: [phone]     Social Drivers of Health (SDOH) Social History: SDOH Screenings   Food Insecurity: No Food Insecurity (02/13/2024)  Housing: Low Risk  (02/13/2024)  Transportation Needs: No Transportation Needs (02/13/2024)  Utilities: Not At Risk (02/14/2024)  Depression (PHQ2-9): Medium Risk (01/19/2024)  Social Connections: Socially Integrated (02/14/2024)  Tobacco Use: Medium Risk (02/14/2024)   SDOH Interventions:     Readmission Risk Interventions     No data to display

## 2024-02-16 NOTE — Progress Notes (Addendum)
  [facility] Progress Note   Subjective:  Seen in room. Denies leg/hip pain. No CP/dyspnea (although using O2). For HD tomorrow.  Objective Vitals:   02/15/24 2025 02/16/24 0417 02/16/24 0700 02/16/24 0850  BP:  (!) 158/57  (!) 158/63  Pulse: 80 73  78  Resp: 18 16  18   Temp:  99.8 F (37.7 C)  98 F (36.7 C)  TempSrc:  Oral    SpO2: 95% 94% 96% 97%  Weight:      Height:       Physical Exam General: Elderly man, NAD. [place] O2 in place Heart: RRR; 2/6 murmur Lungs: CTA anteriorly Abdomen: soft Extremities: no LE edema; bandage to R anterior hip Dialysis Access: LUE AVF +t/b  Additional Objective Labs: Basic Metabolic Panel: Recent Labs  Lab 02/13/24 1821 02/14/24 0523 02/15/24 0548  NA 141 142 135  K 4.6 5.1 4.8  CL 98 99 95*  CO2 29 29 28   GLUCOSE 111* 91 93  BUN 42* 50* 27*  CREATININE 6.27* 7.32* 4.58*  CALCIUM 8.8* 8.6* 8.4*  PHOS  --  7.1*  --    Liver Function Tests: Recent Labs  Lab 02/14/24 0523  ALBUMIN 3.1*   CBC: Recent Labs  Lab 02/13/24 1821 02/14/24 0523 02/15/24 0548  WBC 15.1* 10.1 9.2  HGB 8.9* 7.6* 7.6*  HCT 28.1* 24.4* 23.3*  MCV 102.6* 101.7* 99.6  PLT 272 248 218   Studies/Results: DG HIP UNILAT WITH PELVIS 2-3 VIEWS RIGHT Result Date: 02/14/2024 CLINICAL DATA:  Intraoperative evaluation.  ORIF of the right hip. EXAM: DG HIP (WITH OR WITHOUT PELVIS) 2-3V RIGHT COMPARISON:  Right hip radiograph dated 02/13/2024. FINDINGS: Multiple intraoperative fluoroscopic spot images are provided. Interval ORIF of a minimally impacted subcapital fracture of the right femoral head and neck junction with 3 partially threaded screws. IMPRESSION: Intraoperative fixation of a subcapital fracture of the right femoral head and neck junction. Electronically Signed   By: Hart Robinsons M.D.   On: 02/14/2024 14:14   DG C-Arm 1-60 Min-No Report Result Date: 02/14/2024 Fluoroscopy was utilized by the requesting physician.  No radiographic  interpretation.   Medications:   arformoterol  15 mcg Nebulization BID   aspirin  325 mg Oral Daily   budesonide  0.5 mg Nebulization BID   Chlorhexidine Gluconate Cloth  6 each Topical Q0600   metoprolol tartrate  12.5 mg Oral BID   olopatadine  1 drop Both Eyes BID   revefenacin  175 mcg Nebulization Daily   sevelamer carbonate  2.4 g Oral TID WC    Dialysis Orders TTS - AF 3hr, 400/600, EDW 68.5kg, 2K/2Ca, UFP #2, LUE AVF, heparin 2500 unit q HD - no ESA d/t bladder cancer - Getting course of IV iron - Hectoral IV q HD  Assessment/Plan: R femoral neck Fx: S/p 3 screws surgically placed 4/5. ESRD: Continue HD on TTS schedule - next HD tomorrow. HTN/volume: BP high, no edema on exam - above prior EDW. Last CXR clear. Anemia of ESRD: Hgb 7.6 - stable.  Need to clarify with family - per notes, not getting ESA d/t Hx bladder cancer- > but s/p TURBT 12/2021. Likely can use at this point. Secondary HPTH: Ca ok, Phos a little high - continue binders. Resume VDRA as outpatient. Nutrition: Alb low, would add protein supplements. COPD: Baseline home O2. Dispo: Ok for dispo from renal standpoint once cleared medically.   Ozzie Hoyle, PA-C 02/16/2024, 9:37 AM  [employer]

## 2024-02-16 NOTE — Progress Notes (Signed)
 Discharge Note for  Jeremy [name]     [birth year]        Discharged :  He fell  and broke his broke hip. Surgery and PT next. Will readmit once ready      6 Minute Walk     Row Name 01/19/24 1552         6 Minute Walk   Phase Initial     Distance 670 feet     Walk Time 4.33 minutes     # of Rest Breaks 1     MPH 1.76     METS 1.3     RPE 11     Perceived Dyspnea  0     VO2 Peak 4.55     Symptoms Yes (comment)     Comments fatigue     Resting HR 74 bpm     Resting BP 148/66     Resting Oxygen Saturation  96 %     Exercise Oxygen Saturation  during 6 min walk 87 %     Max Ex. HR 85 bpm     Max Ex. BP 150/64     2 Minute Post BP 132/66       Interval HR   1 Minute HR 79     2 Minute HR 85     3 Minute HR 84     4 Minute HR 80     5 Minute HR 80     6 Minute HR 83     2 Minute Post HR 69     Interval Heart Rate? Yes       Interval Oxygen   Interval Oxygen? Yes     Baseline Oxygen Saturation % 96 %     1 Minute Oxygen Saturation % 90 %     1 Minute Liters of Oxygen 3 L     2 Minute Oxygen Saturation % 91 %     2 Minute Liters of Oxygen 3 L     3 Minute Oxygen Saturation % 88 %     3 Minute Liters of Oxygen 3 L     4 Minute Oxygen Saturation % 88 %     4 Minute Liters of Oxygen 3 L     5 Minute Oxygen Saturation % 88 %     5 Minute Liters of Oxygen 3 L     6 Minute Oxygen Saturation % 87 %     6 Minute Liters of Oxygen 3 L     2 Minute Post Oxygen Saturation % 93 %     2 Minute Post Liters of Oxygen 3 L

## 2024-02-16 NOTE — Plan of Care (Signed)
  Problem: Education: Goal: Knowledge of General Education information will improve Description: Including pain rating scale, medication(s)/side effects and non-pharmacologic comfort measures Outcome: Progressing   Problem: Health Behavior/Discharge Planning: Goal: Ability to manage health-related needs will improve Outcome: Progressing   Problem: Clinical Measurements: Goal: Ability to maintain clinical measurements within normal limits will improve Outcome: Progressing Goal: Will remain free from infection Outcome: Progressing Goal: Diagnostic test results will improve Outcome: Progressing   Problem: Elimination: Goal: Will not experience complications related to bowel motility Outcome: Progressing   Problem: Safety: Goal: Ability to remain free from injury will improve Outcome: Progressing

## 2024-02-16 NOTE — Care Management Important Message (Signed)
 Important Message  Patient Details  Name: Jeremy [name] MRN: [id number] Date of Birth: [birth year]   Important Message Given:  Yes - Medicare IM     Dorena Bodo 02/16/2024, 3:28 PM

## 2024-02-16 NOTE — Progress Notes (Signed)
   Subjective:  Patient reports pain as mild.Tolerating diet. Worked with PT to get out of bed.  Objective:   VITALS:   Vitals:   02/15/24 1701 02/15/24 1948 02/15/24 2025 02/16/24 0417  BP: (!) 140/63 (!) 147/66  (!) 158/57  Pulse: 85 80 80 73  Resp: 19 18 18 16   Temp: 98 F (36.7 C) 98.7 F (37.1 C)  99.8 F (37.7 C)  TempSrc: Axillary Axillary  Oral  SpO2: 93% 95% 95% 94%  Weight:      Height:        Right hip dressing is CDI. SILT right foot in all distributions. Fires EHL/TA/GS  Lab Results  Component Value Date   WBC 9.2 02/15/2024   HGB 7.6 (L) 02/15/2024   HCT 23.3 (L) 02/15/2024   MCV 99.6 02/15/2024   PLT 218 02/15/2024     Assessment/Plan:  2 Days Post-Op status post right 3 screws placement doing well  - Expected postop acute blood loss anemia - will monitor for symptoms - Patient to work with PT to optimize mobilization safely - DVT ppx - SCDs, ambulation, Aspirin 325 daily - Postoperative Abx: Ancef x 2 additional doses given - WBAT operative extremity - Pain control - multimodal pain management, ATC acetaminophen in conjunction with as needed narcotic (oxycodone), although this should be minimized with other modalities   Witten Certain 02/16/2024, 6:55 AM

## 2024-02-16 NOTE — Plan of Care (Signed)
   Problem: Education: Goal: Knowledge of General Education information will improve Description: Including pain rating scale, medication(s)/side effects and non-pharmacologic comfort measures Outcome: Progressing   Problem: Clinical Measurements: Goal: Ability to maintain clinical measurements within normal limits will improve Outcome: Progressing Goal: Will remain free from infection Outcome: Progressing   Problem: Safety: Goal: Ability to remain free from injury will improve Outcome: Progressing

## 2024-02-17 DIAGNOSIS — J42 Unspecified chronic bronchitis: Secondary | ICD-10-CM | POA: Diagnosis not present

## 2024-02-17 DIAGNOSIS — J9610 Chronic respiratory failure, unspecified whether with hypoxia or hypercapnia: Secondary | ICD-10-CM | POA: Diagnosis not present

## 2024-02-17 DIAGNOSIS — J9621 Acute and chronic respiratory failure with hypoxia: Secondary | ICD-10-CM

## 2024-02-17 DIAGNOSIS — J690 Pneumonitis due to inhalation of food and vomit: Secondary | ICD-10-CM

## 2024-02-17 DIAGNOSIS — S7291XA Unspecified fracture of right femur, initial encounter for closed fracture: Secondary | ICD-10-CM | POA: Diagnosis not present

## 2024-02-17 DIAGNOSIS — N186 End stage renal disease: Secondary | ICD-10-CM | POA: Diagnosis not present

## 2024-02-17 LAB — CBC
HCT: 22 % — ABNORMAL LOW (ref 39.0–52.0)
HCT: 23.5 % — ABNORMAL LOW (ref 39.0–52.0)
Hemoglobin: 7.3 g/dL — ABNORMAL LOW (ref 13.0–17.0)
Hemoglobin: 7.7 g/dL — ABNORMAL LOW (ref 13.0–17.0)
MCH: 32.3 pg (ref 26.0–34.0)
MCH: 32.1 pg (ref 26.0–34.0)
MCHC: 33.2 g/dL (ref 30.0–36.0)
MCHC: 32.8 g/dL (ref 30.0–36.0)
MCV: 97.3 fL (ref 80.0–100.0)
MCV: 97.9 fL (ref 80.0–100.0)
Platelets: 250 10*3/uL (ref 150–400)
Platelets: 260 10*3/uL (ref 150–400)
RBC: 2.26 MIL/uL — ABNORMAL LOW (ref 4.22–5.81)
RBC: 2.4 MIL/uL — ABNORMAL LOW (ref 4.22–5.81)
RDW: 12.8 % (ref 11.5–15.5)
RDW: 12.7 % (ref 11.5–15.5)
WBC: 12.5 10*3/uL — ABNORMAL HIGH (ref 4.0–10.5)
WBC: 12.3 10*3/uL — ABNORMAL HIGH (ref 4.0–10.5)
nRBC: 0 % (ref 0.0–0.2)
nRBC: 0 % (ref 0.0–0.2)

## 2024-02-17 LAB — RENAL FUNCTION PANEL
Albumin: 2.9 g/dL — ABNORMAL LOW (ref 3.5–5.0)
Anion gap: 18 — ABNORMAL HIGH (ref 5–15)
BUN: 76 mg/dL — ABNORMAL HIGH (ref 8–23)
CO2: 25 mmol/L (ref 22–32)
Calcium: 8.3 mg/dL — ABNORMAL LOW (ref 8.9–10.3)
Chloride: 93 mmol/L — ABNORMAL LOW (ref 98–111)
Creatinine, Ser: 8.47 mg/dL — ABNORMAL HIGH (ref 0.61–1.24)
GFR, Estimated: 6 mL/min — ABNORMAL LOW (ref >60)
Glucose, Bld: 114 mg/dL — ABNORMAL HIGH (ref 70–99)
Phosphorus: 7.4 mg/dL — ABNORMAL HIGH (ref 2.5–4.6)
Potassium: 5.2 mmol/L — ABNORMAL HIGH (ref 3.5–5.1)
Sodium: 136 mmol/L (ref 135–145)

## 2024-02-17 LAB — BASIC METABOLIC PANEL WITH GFR
Anion gap: 17 — ABNORMAL HIGH (ref 5–15)
BUN: 75 mg/dL — ABNORMAL HIGH (ref 8–23)
CO2: 25 mmol/L (ref 22–32)
Calcium: 8.1 mg/dL — ABNORMAL LOW (ref 8.9–10.3)
Chloride: 94 mmol/L — ABNORMAL LOW (ref 98–111)
Creatinine, Ser: 8.39 mg/dL — ABNORMAL HIGH (ref 0.61–1.24)
GFR, Estimated: 6 mL/min — ABNORMAL LOW (ref >60)
Glucose, Bld: 105 mg/dL — ABNORMAL HIGH (ref 70–99)
Potassium: 5.1 mmol/L (ref 3.5–5.1)
Sodium: 136 mmol/L (ref 135–145)

## 2024-02-17 MED ORDER — HEPARIN SODIUM (PORCINE) 1000 UNIT/ML DIALYSIS: 1000.0000 [IU] | INTRAMUSCULAR | Status: DC | PRN

## 2024-02-17 MED ORDER — LIDOCAINE-PRILOCAINE 2.5-2.5 % EX CREA: 1.0000 | TOPICAL_CREAM | CUTANEOUS | Status: DC | PRN

## 2024-02-17 MED ORDER — PENTAFLUOROPROP-TETRAFLUOROETH EX AERO: 1.0000 | INHALATION_SPRAY | CUTANEOUS | Status: DC | PRN

## 2024-02-17 MED ORDER — POLYETHYLENE GLYCOL 3350 17 G PO PACK
17.0000 g | PACK | Freq: Two times a day (BID) | ORAL | Status: DC
  Administered 2024-02-18 – 2024-02-23 (×9): 17 g via ORAL
  Filled 2024-02-17 (×10): qty 1

## 2024-02-17 MED ORDER — ANTICOAGULANT SODIUM CITRATE 4% (200MG/5ML) IV SOLN: 5.0000 mL | Status: DC | PRN

## 2024-02-17 MED ORDER — SENNOSIDES-DOCUSATE SODIUM 8.6-50 MG PO TABS
1.0000 | ORAL_TABLET | Freq: Two times a day (BID) | ORAL | Status: DC
  Administered 2024-02-17 – 2024-02-23 (×12): 1 via ORAL
  Filled 2024-02-17 (×12): qty 1

## 2024-02-17 MED ORDER — LIDOCAINE HCL (PF) 1 % IJ SOLN: 5.0000 mL | INTRAMUSCULAR | Status: DC | PRN

## 2024-02-17 MED ORDER — ALTEPLASE 2 MG IJ SOLR: 2.0000 mg | Freq: Once | INTRAMUSCULAR | Status: DC | PRN

## 2024-02-17 MED ORDER — QUETIAPINE FUMARATE 25 MG PO TABS
25.0000 mg | ORAL_TABLET | Freq: Every day | ORAL | Status: AC
  Administered 2024-02-17: 25 mg via ORAL
  Filled 2024-02-17: qty 1

## 2024-02-17 MED ORDER — SODIUM CHLORIDE 0.9 % IV SOLN
2.0000 g | INTRAVENOUS | Status: DC
  Administered 2024-02-17: 2 g via INTRAVENOUS
  Filled 2024-02-17: qty 20

## 2024-02-17 MED ORDER — NEPRO/CARBSTEADY PO LIQD: 237.0000 mL | ORAL | Status: DC | PRN

## 2024-02-17 NOTE — Procedures (Signed)
 I was present at this dialysis session. I have reviewed the session itself and made appropriate changes.   On 2K bath with K of 5.2.  2L UF goal.  Using TDC. No c/o or needs.    Filed Weights   02/14/24 0931 02/14/24 1508 02/14/24 1908  Weight: 71.2 kg 72 kg 72 kg    Recent Labs  Lab 02/17/24 0805  NA 136  K 5.2*  CL 93*  CO2 25  GLUCOSE 114*  BUN 76*  CREATININE 8.47*  CALCIUM 8.3*  PHOS 7.4*    Recent Labs  Lab 02/15/24 0548 02/17/24 0642 02/17/24 0740  WBC 9.2 12.5* 12.3*  HGB 7.6* 7.3* 7.7*  HCT 23.3* 22.0* 23.5*  MCV 99.6 97.3 97.9  PLT 218 250 260    Scheduled Meds:  arformoterol  15 mcg Nebulization BID   aspirin  325 mg Oral Daily   budesonide  0.5 mg Nebulization BID   Chlorhexidine Gluconate Cloth  6 each Topical Q0600   metoprolol tartrate  12.5 mg Oral BID   olopatadine  1 drop Both Eyes BID   revefenacin  175 mcg Nebulization Daily   sevelamer carbonate  2.4 g Oral TID WC   Continuous Infusions:  anticoagulant sodium citrate     cefTRIAXone (ROCEPHIN)  IV     PRN Meds:.acetaminophen, alteplase, anticoagulant sodium citrate, antiseptic oral rinse, heparin, lidocaine (PF), lidocaine-prilocaine, melatonin, oxyCODONE, pentafluoroprop-tetrafluoroeth, polyethylene glycol, prochlorperazine   Sabra Heck  MD 02/17/2024, 10:34 AM

## 2024-02-17 NOTE — Progress Notes (Addendum)
 At 2210 wife called this nurse that patient is having trouble breathing again, same event that happened the other night per wife. Patient is uneasy and trying to remove blankets, was able to say that he feels something in trapped in his throat, saturation was at 92% on 4 LPM, suctioned mouth but only got clear thick secretion, patient was not able to verbalize much compared to when this nurse assessed him at 2130, seems to be gasping for air and has labored breathing, updated Chinita Greenland NP. Called Rapid response  nurse Mindy.  At 2340 DR. Opyd at bedside.

## 2024-02-17 NOTE — Significant Event (Signed)
 Rapid Response Event Note   Reason for Call :  Labored breathing, SpO2-88% on 4L [place]. RN placed pt on NRB with SpO2 increasing to 100%.  Initial Focused Assessment:  Pt lying in bed with eyes closed. He will not respond to voice.  With painful stimulation, he will look at you , squeeze hands, but will not speak.  With no stimulation, he stops responding again. Pt airway obstructing when not being stimulated.  With stimulation, pt breathing is better.  NTS attempted-Pt awakened even more and was able to speak. RT called to room. Pt placed on CPAP with 4L's bled in. Lung sounds diminished. ABD soft/nontender. Skin warm and dry.   T-97.7, HR-78, BP-174/68, RR-16, SpO2-96% on 6L [place].   Per wife at bedside and RN, pt was alert and oriented prior to receiving 25mg  seroquel at 2132.    Interventions:  [place]>NRB>[place]>CPAP NTS Plan of Care:  Pt breathing better with CPAP-SpO2-94%. Continue CPAP while pt sleeping. Please call RRT if further assistance needed.    Event Summary:   MD Notified: Garner Nash, NP notified, Dr. Antionette Char to bedside to see pt.  Call Time:2230 Arrival Time:2235 End [id number]  Terrilyn Saver, RN

## 2024-02-17 NOTE — Plan of Care (Signed)

## 2024-02-17 NOTE — Progress Notes (Signed)
 Physical Therapy Treatment Patient Details Name: Jeremy [name] MRN: [id number] DOB: 1941/07/17 Today's Date: 02/17/2024   History of Present Illness Patient is 83 y.o. male who presented to the ED on 02/14/24 after a mechanical fall from tripping on a rug. Work up revealed right femoral fracture, pt now s/p Rt LE ORIF 02/14/24.     PMH includes chronic respiratory failure, ESRD, tobacco abuse, OSA, HTN, HLD.    PT Comments  Patient alert and feeding himself peaches on arrival. As initiated ROM exercises, pt began falling asleep and needed constant cues to awaken and attend to task. Even moving his painful RLE did not increase his arousal. Patient post-hemodialysis and aide reports he also had pain medication. Remainder of session/mobility deferred due to pt's inability to participate. Will plan to see 4/9 on his non-HD day.     If plan is discharge home, recommend the following: Two people to help with walking and/or transfers;Two people to help with bathing/dressing/bathroom;Assist for transportation;Help with stairs or ramp for entrance;Supervision due to cognitive status;Direct supervision/assist for medications management;Assistance with cooking/housework   Can travel by private vehicle     No  Equipment Recommendations  None recommended by PT (defer to next venue)    Recommendations for Other Services       Precautions / Restrictions Precautions Precautions: Fall Recall of Precautions/Restrictions: Impaired Restrictions Weight Bearing Restrictions Per Provider Order: Yes RLE Weight Bearing Per Provider Order: Weight bearing as tolerated     Mobility  Bed Mobility               General bed mobility comments: too obtunded to attempt mobility    Transfers                        Ambulation/Gait                   Stairs             Wheelchair Mobility     Tilt Bed    Modified Rankin (Stroke Patients Only)       Balance                                             Communication Communication Communication: Impaired Factors Affecting Communication: Hearing impaired  Cognition Arousal: Obtunded, Suspect due to medications (and post-HD) Behavior During Therapy: WFL for tasks assessed/performed   PT - Cognitive impairments: Orientation, Awareness, Memory, Attention, Initiation, Sequencing, Problem solving, Safety/Judgement   Orientation impairments: Place, Time, Situation                   PT - Cognition Comments: pt confused and unable to answer questions appropriately often laughing throughout. pt also HOH. Following commands: Impaired Following commands impaired: Follows one step commands inconsistently, Follows one step commands with increased time    Cueing Cueing Techniques: Verbal cues, Tactile cues, Gestural cues  Exercises General Exercises - Lower Extremity Ankle Circles/Pumps: AAROM, Both, 5 reps Heel Slides: AAROM, Right, 10 reps Straight Leg Raises: AAROM, Right, 5 reps Other Exercises Other Exercises: AAROM RUE shoulder flexion with hopes of increasing arousal    General Comments General comments (skin integrity, edema, etc.): Pt HH aide present. Reports he's been very sleepy post-HD and was given some pain medication      Pertinent Vitals/Pain Pain Assessment Pain Assessment: Faces Faces  Pain Scale: Hurts little more Pain Location: Rt LE with mobility Pain Descriptors / Indicators: Aching, Discomfort, Sore Pain Intervention(s): Limited activity within patient's tolerance, Monitored during session    Home Living                          Prior Function            PT Goals (current goals can now be found in the care plan section) Acute Rehab PT Goals Patient Stated Goal: regain mobility and awareness Time For Goal Achievement: 02/29/24 Potential to Achieve Goals: Good Progress towards PT goals: Not progressing toward goals - comment (lethargy)     Frequency    Min 2X/week      PT Plan      Co-evaluation              AM-PAC PT "6 Clicks" Mobility   Outcome Measure  Help needed turning from your back to your side while in a flat bed without using bedrails?: A Lot Help needed moving from lying on your back to sitting on the side of a flat bed without using bedrails?: Total Help needed moving to and from a bed to a chair (including a wheelchair)?: A Lot Help needed standing up from a chair using your arms (e.g., wheelchair or bedside chair)?: A Lot Help needed to walk in hospital room?: Total Help needed climbing 3-5 steps with a railing? : Total 6 Click Score: 9    End of Session   Activity Tolerance: Patient limited by lethargy Patient left: in bed;with call bell/phone within reach;with bed alarm set;with family/visitor present (chair position)   PT Visit Diagnosis: Other abnormalities of gait and mobility (R26.89);Difficulty in walking, not elsewhere classified (R26.2);Pain;Other symptoms and signs involving the nervous system (R29.898);Unsteadiness on feet (R26.81);History of falling (Z91.81) Pain - Right/Left: Right Pain - part of body: Hip;Leg     Time: 7829-5621 PT Time Calculation (min) (ACUTE ONLY): 15 min  Charges:    $Therapeutic Exercise: 8-22 mins PT General Charges $$ ACUTE PT VISIT: 1 Visit                      Jeremy [name], PT Acute Rehabilitation Services  Office [phone]    Jeremy [name] 02/17/2024, 3:50 PM

## 2024-02-17 NOTE — Progress Notes (Signed)
 Received patient in bed. Awake ,alert and oriented x 2. Consent verified.  Access used : Left arm AVF that worked well.  Duration of treatment: 3 hours.  Net UF : 2 L  Medicine given: Rocephine 2 g=100 ML  Hand off to the patient's nurse.Tolerated treatment ,back into his room via transporter with stable medical condition.

## 2024-02-17 NOTE — Progress Notes (Signed)
 PROGRESS NOTE    [name]  ZOX:[id number] DOB: 1941-10-04 DOA: 02/13/2024 PCP: Jeremy Coombe, DO   Brief Narrative:  Jeremy [name] is a 83 y.o. male with medical history significant for COPD on 3 L oxygen supplementation at baseline, OSA on CPAP, ESRD on HD TTS, chronic HFpEF, hyperlipidemia, history of bladder cancer, macular degeneration, who presents to the ER after a mechanical fall having tripped on a rug.  Imaging reported right femoral fracture, hospitalist called for admission, orthopedics called in consult.  Assessment & Plan:   Principal Problem:   Right femoral fracture (HCC)  Acute encephalopathy, likely multifactorial  Rule out aspiration versus postoperative pneumonia -Fever x 1 overnight resolved with Tylenol, has not repeated, patient has low-grade leukocytosis -he does meet criteria for sepsis but does not appear septic clinically -Imaging, chest x-ray, overnight with potential right lower lobe opacity concerning for pneumonia, possibly aspiration, will have speech evaluate patient once he is more appropriate and following commands -Mental status multifactorial in the setting of anesthesia, narcotics, sundowning -attempt to reorient today, wean narcotics as appropriate, trial Seroquel x 1 tonight to improve sleep hygiene  Mild acute on chronic hypoxic respiratory failure on 3 L nasal cannula continuously COPD, not in acute exacerbation -Continue home regimen including Yupelri, Brovana, budesonide -Continue oxygen, oxygen needs increased overnight from baseline of 3 L to 4 L nasal cannula to maintain sats in the low 90s -consistent with aspiration pneumonia as above  Right femoral fracture, status post mechanical fall -Status post ORIF 4/ 5/25 with orthopedic surgery, Dr. 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   DVT prophylaxis: SCDs Start: 02/13/24 2114 Code Status:   Code Status: Full Code Family Communication: None present  Status is: Inpatient  Dispo: The patient is from: Home              Anticipated d/c is to: To be determined              Anticipated d/c date is: 48 to 72 hours              Patient currently not medically stable for discharge given ongoing need for postsurgical care, PT evaluation and safe disposition  Consultants:  Nephrology, orthopedic surgery  Procedures:  ORIF 02/14/24  Antimicrobials:  Perioperatively  Subjective: Noted febrile overnight at 102 x 1, resolved without repeat, review of systems markedly limited today in the setting of patient's mental status.  No reports of nausea vomiting diarrhea or other overt symptoms.  Family and caretaker deny any recent choking or coughing episodes during p.o. intake  Objective: Vitals:   02/16/24 2235 02/16/24 2300 02/17/24 0031 02/17/24 0434  BP: (!) 156/53  (!) 154/63 (!) 151/56  Pulse: 89  82 77  Resp: 20  16 17   Temp: (!) 102 F (38.9 C) 99.9 F (37.7 C) 98.4 F (36.9 C) 97.8 F (  36.6 C)  TempSrc: Oral Oral Oral Oral  SpO2: 93%  100% 91%  Weight:      Height:        Intake/Output Summary (Last 24 hours) at 02/17/2024 0709 Last data filed at 02/17/2024 1610 Gross per 24 hour  Intake 480 ml  Output 225 ml  Net 255 ml   Filed Weights   02/14/24 0931  02/14/24 1508 02/14/24 1908  Weight: 71.2 kg 72 kg 72 kg    Examination:  General:  Pleasantly resting in bed, No acute distress. HEENT:  Normocephalic atraumatic.  Sclerae nonicteric, noninjected.  Extraocular movements intact bilaterally. Neck:  Without mass or deformity.  Trachea is midline. Lungs:  Clear to auscultate bilaterally Heart:  Regular rate and rhythm.  Without murmurs, rubs, or gallops. Abdomen:  Soft, nontender, nondistended.  Without guarding or rebound. Extremities: Without cyanosis, clubbing, edema  Data Reviewed: I have personally reviewed following labs and imaging studies  CBC: Recent Labs  Lab 02/13/24 1821 02/14/24 0523 02/15/24 0548  WBC 15.1* 10.1 9.2  HGB 8.9* 7.6* 7.6*  HCT 28.1* 24.4* 23.3*  MCV 102.6* 101.7* 99.6  PLT 272 248 218   Basic Metabolic Panel: Recent Labs  Lab 02/13/24 1821 02/14/24 0523 02/15/24 0548  NA 141 142 135  K 4.6 5.1 4.8  CL 98 99 95*  CO2 29 29 28   GLUCOSE 111* 91 93  BUN 42* 50* 27*  CREATININE 6.27* 7.32* 4.58*  CALCIUM 8.8* 8.6* 8.4*  MG  --  2.2  --   PHOS  --  7.1*  --    GFR: Estimated Creatinine Clearance: 12.4 mL/min (A) (by C-G formula based on SCr of 4.58 mg/dL (H)). Liver Function Tests: Recent Labs  Lab 02/14/24 0523  ALBUMIN 3.1*   No results for input(s): "LIPASE", "AMYLASE" in the last 168 hours. No results for input(s): "AMMONIA" in the last 168 hours. Coagulation Profile: Recent Labs  Lab 02/14/24 0523  INR 1.2    Recent Results (from the past 240 hours)  Surgical pcr screen     Status: None   Collection Time: 02/14/24  1:19 AM   Specimen: Nasal Mucosa; Nasal Swab  Result Value Ref Range Status   MRSA, PCR NEGATIVE NEGATIVE Final   Staphylococcus aureus NEGATIVE NEGATIVE Final    Comment: (NOTE) The Xpert SA Assay (FDA approved for NASAL specimens in patients 72 years of age and older), is one component of a comprehensive surveillance program. It is not intended to diagnose  infection nor to guide or monitor treatment. Performed at [facility], [address] [place], [place] [address]     Radiology Studies: DG CHEST PORT 1 VIEW Result Date: 02/17/2024 CLINICAL DATA:  [id number] Respirations compromised [id number] EXAM: PORTABLE CHEST 1 VIEW COMPARISON:  Chest x-ray 02/13/2024 FINDINGS: The heart and mediastinal contours are unchanged. Atherosclerotic plaque. Question developing left base airspace opacity. Chronic coarsened smoking with no overt pulmonary edema. No pleural effusion. No pneumothorax. No acute osseous abnormality. IMPRESSION: 1. Question developing left base airspace opacity. Recommend repeat PA and lateral view of the chest for further evaluation. 2. Aortic Atherosclerosis (ICD10-I70.0) and Emphysema (ICD10-J43.9). Electronically Signed   By: Tish Frederickson M.D.   On: 02/17/2024 03:33   Scheduled Meds:  arformoterol  15 mcg Nebulization BID   aspirin  325 mg Oral Daily   budesonide  0.5 mg Nebulization BID   Chlorhexidine Gluconate Cloth  6 each Topical Q0600   metoprolol tartrate  12.5 mg Oral BID  olopatadine  1 drop Both Eyes BID   revefenacin  175 mcg Nebulization Daily   sevelamer carbonate  2.4 g Oral TID WC   Continuous Infusions:   LOS: 4 days   Time spent:  Azucena Fallen, DO [facility]  If 7PM-7AM, please contact night-coverage [email or web address]  02/17/2024, 7:09 AM

## 2024-02-17 NOTE — TOC Progression Note (Addendum)
 Transition of Care [facility]) - Progression Note    Patient Details  Name: Jeremy [name] MRN: [id number] Date of Birth: 19-Oct-1941  Transition of Care [facility]) CM/SW Contact  Lorri Frederick, LCSW Phone Number: 02/17/2024, 10:55 AM  Clinical Narrative:   CSW received message from CIR that pt is not candidate for admissions.  CSW provided bed offers to wife Jeremy [name], she would like to accept offer at [facility].  1245: [facility] confirms they can receive pt when ready.  MD notified, no DC today.   Expected Discharge Plan: Skilled Nursing Facility Barriers to Discharge: Continued Medical Work up, SNF Pending bed offer  Expected Discharge Plan and Services In-house Referral: Clinical Social Work   Post Acute Care Choice: Skilled Nursing Facility Living arrangements for the past 2 months: Single Family Home                                       Social Determinants of Health (SDOH) Interventions SDOH Screenings   Food Insecurity: No Food Insecurity (02/13/2024)  Housing: Low Risk  (02/13/2024)  Transportation Needs: No Transportation Needs (02/13/2024)  Utilities: Not At Risk (02/14/2024)  Depression (PHQ2-9): Medium Risk (01/19/2024)  Social Connections: Socially Integrated (02/14/2024)  Tobacco Use: Medium Risk (02/14/2024)    Readmission Risk Interventions     No data to display

## 2024-02-17 NOTE — Progress Notes (Signed)
 PT Cancellation Note  Patient Details Name: KOURY RODDY MRN: [id number] DOB: 10-22-1941   Cancelled Treatment:    Reason Eval/Treat Not Completed: Patient at procedure or test/unavailable  Currently in HD;  Will follow up later today as time allows;  Otherwise, will follow up for PT tomorrow;   Thank you,  Van Clines, PT  Acute Rehabilitation Services Office [phone]    Levi Aland 02/17/2024, 11:03 AM

## 2024-02-18 ENCOUNTER — Inpatient Hospital Stay (HOSPITAL_COMMUNITY)

## 2024-02-18 ENCOUNTER — Encounter

## 2024-02-18 ENCOUNTER — Ambulatory Visit: Payer: Medicare Other | Admitting: Podiatry

## 2024-02-18 DIAGNOSIS — N186 End stage renal disease: Secondary | ICD-10-CM | POA: Diagnosis not present

## 2024-02-18 DIAGNOSIS — J449 Chronic obstructive pulmonary disease, unspecified: Secondary | ICD-10-CM | POA: Diagnosis not present

## 2024-02-18 DIAGNOSIS — S7291XA Unspecified fracture of right femur, initial encounter for closed fracture: Secondary | ICD-10-CM | POA: Diagnosis not present

## 2024-02-18 DIAGNOSIS — J9611 Chronic respiratory failure with hypoxia: Secondary | ICD-10-CM

## 2024-02-18 LAB — BASIC METABOLIC PANEL WITH GFR
Anion gap: 14 (ref 5–15)
BUN: 49 mg/dL — ABNORMAL HIGH (ref 8–23)
CO2: 27 mmol/L (ref 22–32)
Calcium: 8.5 mg/dL — ABNORMAL LOW (ref 8.9–10.3)
Chloride: 93 mmol/L — ABNORMAL LOW (ref 98–111)
Creatinine, Ser: 5.58 mg/dL — ABNORMAL HIGH (ref 0.61–1.24)
GFR, Estimated: 9 mL/min — ABNORMAL LOW (ref >60)
Glucose, Bld: 93 mg/dL (ref 70–99)
Potassium: 4.4 mmol/L (ref 3.5–5.1)
Sodium: 134 mmol/L — ABNORMAL LOW (ref 135–145)

## 2024-02-18 LAB — CBC
HCT: 22.2 % — ABNORMAL LOW (ref 39.0–52.0)
Hemoglobin: 7.5 g/dL — ABNORMAL LOW (ref 13.0–17.0)
MCH: 32.6 pg (ref 26.0–34.0)
MCHC: 33.8 g/dL (ref 30.0–36.0)
MCV: 96.5 fL (ref 80.0–100.0)
Platelets: 272 10*3/uL (ref 150–400)
RBC: 2.3 MIL/uL — ABNORMAL LOW (ref 4.22–5.81)
RDW: 12.8 % (ref 11.5–15.5)
WBC: 8.6 10*3/uL (ref 4.0–10.5)
nRBC: 0 % (ref 0.0–0.2)

## 2024-02-18 MED ORDER — AMOXICILLIN-POT CLAVULANATE 875-125 MG PO TABS: 1.0000 | ORAL_TABLET | Freq: Two times a day (BID) | ORAL | Status: DC

## 2024-02-18 MED ORDER — DARBEPOETIN ALFA 60 MCG/0.3ML IJ SOSY
60.0000 ug | PREFILLED_SYRINGE | INTRAMUSCULAR | Status: DC
  Administered 2024-02-18: 60 ug via SUBCUTANEOUS
  Filled 2024-02-18 (×2): qty 0.3

## 2024-02-18 MED ORDER — AMOXICILLIN-POT CLAVULANATE 500-125 MG PO TABS
1.0000 | ORAL_TABLET | Freq: Every day | ORAL | Status: DC
  Administered 2024-02-18 – 2024-02-22 (×5): 1 via ORAL
  Filled 2024-02-18 (×6): qty 1

## 2024-02-18 NOTE — Progress Notes (Signed)
 Patient is awake and started removing his cpap several times leading to him being on room air, placed back on his nasal cannula on 4 LPM and noted saturations to be at 92%, patient is not in distress and is not removing the nasal cannula.

## 2024-02-18 NOTE — Procedures (Signed)
 Modified Barium Swallow Study  Patient Details  Name: Jeremy [name] MRN: [id number] Date of Birth: 1940/12/03  Today's Date: 02/18/2024  Modified Barium Swallow completed.  Full report located under Chart Review in the Imaging Section.  History of Present Illness Patient is an 83 year old male presenting with femoral fx after a mechanical fall. Patient with PMH of COPD, obstructive sleep apnea (on CPAP) end-stage renal disease on hemodialysis, HFpEF, hx of bladder cancer. Patient seen for MBS on 12/08/23 recommending D3/NTL. Patient with "silent aspiration (PAS 8) of nectar thick liquids during initial few sips and one instance of questionable silent aspiration with thin liquids (shoulder obscured view during at times). Subsequent sips of nectar thick liquids did not result in aspiration" Recent CXR notes "Question developing left base airspace opacity. Recommend repeat  PA and lateral view of the chest for further evaluation."   Clinical Impression Patient presents with severe oropharyngeal dysphagia. Oral phase is characterized by reduced lingual control/coordination resulting in posterior spillage into the trachea. Patient with complete labial seal and mildly prolonged mastication during this MBS. Pharyngeal phase is characterized by reduced sensation and mistiming of epiglottic inversion, laryngeal vestibular closure and laryngeal elevation resulting in gross and chronic aspiration before and during the swallow with thin liquids, NTL and HTL. SLP attempted use of chin tuck which was effective in eliminating aspiration with tsp sips of HTL. Aspiration persisted with NTL despite use of compensatory strategies. Patient with inconsistent reflexive cough during aspiration events, though was able to clear some aspirate when cued cough was completed. Transient penetration of purees and solids occurred. Of note, patient was significantly fatigued throughout evaluation resulting in poor posture. Patient  benefited from cues to face straight ahead and remain alert during PO intake. MD notified of patients gross and chronic aspiration. At this time, recommend dysphagia 2 solids with HTL via tsp with chin tuck and intermittent throat clear. Patient should receive FULL supervision from wife and/or staff to utilize strategies. Patient should sit upright in bed during PO intake and consume small bites at a slow rate. Patient should only consume PO if fully awake/alert.  Factors that may increase risk of adverse event in presence of aspiration Rubye Oaks [employer] Clearance Coots 2021): Aspiration of thick, dense, and/or acidic materials;Frequent aspiration of large volumes;Limited mobility;Frail or deconditioned  Swallow Evaluation Recommendations Recommendations: PO diet PO Diet Recommendation: Dysphagia 2 (Finely chopped);Moderately thick liquids (Level 3, honey thick) (via tsp) Liquid Administration via: Spoon Medication Administration: Crushed with puree Supervision: Full supervision/cueing for swallowing strategies Swallowing strategies  : Minimize environmental distractions;Slow rate;Small bites/sips;Clear throat intermittently;Chin tuck Postural changes: Position pt fully upright for meals Oral care recommendations: Oral care BID (2x/day)     Shiara Mcgough M.A., CCC-SLP Lynne Takemoto F Shyler Hamill 02/18/2024,2:22 PM

## 2024-02-18 NOTE — Progress Notes (Addendum)
 [facility] Progress Note   Subjective:  Seen in room - wife and caregiver in room. Rapid response called last night, dyspneic with mucus and reduced consciousness - improved with C-pap. Wife reports happened 3 nights in row, but last night was worse - plan is to NOT given any sleep medication today and continue C-pap nightly. He is more awake this morning, eating breakfast. Coughing up mucus. Denies CP/dyspnea. HD went ok yesterday -> for HD tomorrow.  Objective Vitals:   02/18/24 0442 02/18/24 0459 02/18/24 0532 02/18/24 0715  BP: (!) 164/79   (!) 152/67  Pulse: 82   77  Resp: 16     Temp: 97.7 F (36.5 C)   97.9 F (36.6 C)  TempSrc:    Oral  SpO2: 90% 92% 92% 95%  Weight:      Height:       Physical Exam General: Elderly man, NAD. Carpenter O2 in place Heart: RRR; 2/6 murmur Lungs: CTA anteriorly Abdomen: soft Extremities: no LE edema Dialysis Access: LUE AVF +t/b  Additional Objective Labs: Basic Metabolic Panel: Recent Labs  Lab 02/14/24 0523 02/15/24 0548 02/17/24 0642 02/17/24 0805 02/18/24 0549  NA 142   < > 136 136 134*  K 5.1   < > 5.1 5.2* 4.4  CL 99   < > 94* 93* 93*  CO2 29   < > 25 25 27   GLUCOSE 91   < > 105* 114* 93  BUN 50*   < > 75* 76* 49*  CREATININE 7.32*   < > 8.39* 8.47* 5.58*  CALCIUM 8.6*   < > 8.1* 8.3* 8.5*  PHOS 7.1*  --   --  7.4*  --    < > = values in this interval not displayed.   Liver Function Tests: Recent Labs  Lab 02/14/24 0523 02/17/24 0805  ALBUMIN 3.1* 2.9*   CBC: Recent Labs  Lab 02/14/24 0523 02/15/24 0548 02/17/24 0642 02/17/24 0740 02/18/24 0549  WBC 10.1 9.2 12.5* 12.3* 8.6  HGB 7.6* 7.6* 7.3* 7.7* 7.5*  HCT 24.4* 23.3* 22.0* 23.5* 22.2*  MCV 101.7* 99.6 97.3 97.9 96.5  PLT 248 218 250 260 272   Studies/Results: DG CHEST PORT 1 VIEW Result Date: 02/17/2024 CLINICAL DATA:  [id number] Respirations compromised [id number] EXAM: PORTABLE CHEST 1 VIEW COMPARISON:  Chest x-ray 02/13/2024 FINDINGS: The  heart and mediastinal contours are unchanged. Atherosclerotic plaque. Question developing left base airspace opacity. Chronic coarsened smoking with no overt pulmonary edema. No pleural effusion. No pneumothorax. No acute osseous abnormality. IMPRESSION: 1. Question developing left base airspace opacity. Recommend repeat PA and lateral view of the chest for further evaluation. 2. Aortic Atherosclerosis (ICD10-I70.0) and Emphysema (ICD10-J43.9). Electronically Signed   By: Tish Frederickson M.D.   On: 02/17/2024 03:33   Medications:   amoxicillin-clavulanate  1 tablet Oral QHS   arformoterol  15 mcg Nebulization BID   aspirin  325 mg Oral Daily   budesonide  0.5 mg Nebulization BID   metoprolol tartrate  12.5 mg Oral BID   olopatadine  1 drop Both Eyes BID   polyethylene glycol  17 g Oral BID   revefenacin  175 mcg Nebulization Daily   senna-docusate  1 tablet Oral BID   sevelamer carbonate  2.4 g Oral TID WC    Dialysis Orders TTS - AF 3hr, 400/600, EDW 68.5kg, 2K/2Ca, UFP #2, LUE AVF, heparin 2500 unit q HD - no ESA d/t bladder cancer - Getting course of IV iron - Hectoral  IV q HD   Assessment/Plan: R femoral neck Fx: S/p 3 screws surgically placed 4/5. ESRD: Continue HD on TTS schedule - next HD tomorrow. Pneumonia: Per CXR, L base infiltrate. Productive cough. On Augmentin. HTN/volume: BP high, no edema on exam - above prior EDW. Anemia of ESRD: Hgb 7.5 - stable.  Hx bladder cancer- > but s/p TURBT 12/2021. Spoke to caregiver about why we don't give ESA - wife was resistant apparently. Wife left by the time I am writing this note - called and left a message. I think we should resume ESA. Secondary HPTH: Ca ok, Phos a little high - continue binders. Resume VDRA as outpatient. Nutrition: Alb low, would add protein supplements. COPD: Baseline home O2. Dispo: Ok for dispo from renal standpoint once cleared medically.  Addendum: Spoke to wife by phone about ESA - she is agreeable.  Will order Aranesp for today.  Ozzie Hoyle, PA-C 02/18/2024, 10:42 AM  [employer]

## 2024-02-18 NOTE — Progress Notes (Signed)
 Speech Language Pathology Treatment: Dysphagia  Patient Details Name: Jeremy [name] MRN: [id number] DOB: 1941/05/14 Today's Date: 02/18/2024 Time: 8295-6213 SLP Time Calculation (min) (ACUTE ONLY): 22 min  Assessment / Plan / Recommendation Clinical Impression  Skilled therapy session focused on family education regarding dysphagia recommendations per MBS. Patients friend and at home caregiver present. Patient verbalized consent for parties to participate in conversation. SLP reviewed basic oropharyngeal anatomy and physiologist utilizing visualizations from [facility]. SLP educated parties regarding patients chronic, gross aspiration during thin liquids, NTL and HTL. SLP verbalized recommendation of dysphagia 2 solids/ HTL via tsp with chin tuck. Patient and caregiver verbalized understanding with concerns regarding patients d/c date. Patients caregiver reports patient with ongoing fatigue PTA exacerbated since recent fall. MD aware of concerns. Patient and friends with no further questions. Diet sign updated and nursing aware of changes.   HPI HPI: Patient is an 83 year old male presenting with femoral fx after a mechanical fall. Patient with PMH of COPD, obstructive sleep apnea (on CPAP) end-stage renal disease on hemodialysis, HFpEF, hx of bladder cancer. Patient seen for MBS on 12/08/23 recommending D3/NTL. Patient with "silent aspiration (PAS 8) of nectar thick liquids during initial few sips and one instance of questionable silent aspiration with thin liquids (shoulder obscured view during at times). Subsequent sips of nectar thick liquids did not result in aspiration" Recent CXR notes "Question developing left base airspace opacity. Recommend repeat  PA and lateral view of the chest for further evaluation."      SLP Plan  Continue with current plan of care      Recommendations for follow up therapy are one component of a multi-disciplinary discharge planning process, led by the attending physician.   Recommendations may be updated based on patient status, additional functional criteria and insurance authorization.    Recommendations  Diet recommendations: Dysphagia 2 (fine chop);Honey-thick liquid (via tsp with chin tuck) Liquids provided via: Teaspoon Medication Administration: Crushed with puree Supervision: Full supervision/cueing for compensatory strategies Compensations: Minimize environmental distractions;Slow rate;Small sips/bites;Clear throat intermittently;Chin tuck Postural Changes and/or Swallow Maneuvers: Seated upright 90 degrees                  Oral care BID   Frequent or constant Supervision/Assistance Dysphagia, oropharyngeal phase (R13.12)     Continue with current plan of care     Akhil Piscopo F Jermiah Soderman Jajuan Skoog M.A., CCC-SLP 02/18/2024, 3:45 PM

## 2024-02-18 NOTE — Progress Notes (Signed)
 Occupational Therapy Treatment Patient Details Name: LINCOLN GINLEY MRN: [id number] DOB: [birth year] Today's Date: 02/18/2024   History of present illness Patient is 83 y.o. male who presented to the ED on 02/14/24 after a mechanical fall from tripping on a rug. Work up revealed right femoral fracture, pt now s/p Rt LE ORIF 02/14/24.     PMH includes chronic respiratory failure, ESRD, tobacco abuse, OSA, HTN, HLD.   OT comments  Pt progressing well towards goals. Pt and pt's caregiver reporting fatigue d/t lack of sleep recently., but agreeable to session. LB dressing completed. Education provided on first in last out dressing technique, pt demo and return understanding. Mod to max assist for STS to pull pants above waist, and assist to steady. Continue to recommend <3 hours of skilled rehab daily to optimize independence levels. Will continue to follow acutely.       If plan is discharge home, recommend the following:  A lot of help with walking and/or transfers;A lot of help with bathing/dressing/bathroom;Assistance with cooking/housework;Direct supervision/assist for medications management;Supervision due to cognitive status   Equipment Recommendations  Other (comment) (Defer to next venue)    Recommendations for Other Services      Precautions / Restrictions Precautions Precautions: Fall Recall of Precautions/Restrictions: Impaired Restrictions Weight Bearing Restrictions Per Provider Order: Yes RLE Weight Bearing Per Provider Order: Weight bearing as tolerated       Mobility Bed Mobility Overal bed mobility: Needs Assistance Bed Mobility: Supine to Sit, Sit to Supine     Supine to sit: Min assist, HOB elevated, Used rails Sit to supine: Min assist, HOB elevated, Used rails   General bed mobility comments: Min assist for RLE    Transfers Overall transfer level: Needs assistance Equipment used: Rolling walker (2 wheels) Transfers: Sit to/from Stand Sit to Stand: Mod assist,  From elevated surface           General transfer comment: Pt declining functional transfers d/t fatigue     Balance Overall balance assessment: Needs assistance, History of Falls Sitting-balance support: Feet supported, No upper extremity supported Sitting balance-Leahy Scale: Fair   Postural control: Posterior lean Standing balance support: Bilateral upper extremity supported, During functional activity, Reliant on assistive device for balance Standing balance-Leahy Scale: Poor Standing balance comment: reliant on RW, initial posterior lean upon standing       ADL either performed or assessed with clinical judgement   ADL Overall ADL's : Needs assistance/impaired     Lower Body Dressing: Moderate assistance;Cueing for compensatory techniques Lower Body Dressing Details (indicate cue type and reason): Min assist to RLE, mod to max assist to stand       Extremity/Trunk Assessment Upper Extremity Assessment Upper Extremity Assessment: Generalized weakness;Right hand dominant   Lower Extremity Assessment Lower Extremity Assessment: Defer to PT evaluation        Vision   Vision Assessment?: No apparent visual deficits         Communication Communication Communication: Impaired Factors Affecting Communication: Hearing impaired   Cognition Arousal: Alert Behavior During Therapy: WFL for tasks assessed/performed Cognition: Cognition impaired     Awareness: Intellectual awareness intact, Online awareness impaired     Executive functioning impairment (select all impairments): Sequencing, Problem solving, Reasoning OT - Cognition Comments: Per caregiver, pt typically A & O x4 with slight memory issues. Per caregiver more confused since hospital stay       Following commands: Impaired Following commands impaired: Follows one step commands with increased time, Only follows one step  commands consistently      Cueing   Cueing Techniques: Verbal cues, Tactile cues,  Gestural cues        General Comments Caregiver present for session reports pt fatigue d/t lack of sleep    Pertinent Vitals/ Pain       Pain Assessment Pain Assessment: No/denies pain Pain Intervention(s): Monitored during session, Repositioned   Frequency  Min 2X/week        Progress Toward Goals  OT Goals(current goals can now be found in the care plan section)  Progress towards OT goals: Progressing toward goals  Acute Rehab OT Goals Patient Stated Goal: None stated OT Goal Formulation: With patient Time For Goal Achievement: 03/01/24 Potential to Achieve Goals: Good ADL Goals Pt Will Perform Lower Body Dressing: with min assist;sit to/from stand Pt Will Transfer to Toilet: with min assist;stand pivot transfer;bedside commode Pt Will Perform Toileting - Clothing Manipulation and hygiene: with min assist;sit to/from stand  Plan         AM-PAC OT "6 Clicks" Daily Activity     Outcome Measure   Help from another person eating meals?: None Help from another person taking care of personal grooming?: A Little Help from another person toileting, which includes using toliet, bedpan, or urinal?: A Lot Help from another person bathing (including washing, rinsing, drying)?: A Lot Help from another person to put on and taking off regular upper body clothing?: A Little Help from another person to put on and taking off regular lower body clothing?: A Lot 6 Click Score: 16    End of Session Equipment Utilized During Treatment: Gait belt;Rolling walker (2 wheels)  OT Visit Diagnosis: Unsteadiness on feet (R26.81);Other abnormalities of gait and mobility (R26.89);Repeated falls (R29.6);Muscle weakness (generalized) (M62.81)   Activity Tolerance Patient tolerated treatment well   Patient Left with call bell/phone within reach;with family/visitor present;in bed;with bed alarm set   Nurse Communication Mobility status        Time: 1610-9604 OT Time Calculation (min): 14  min  Charges: OT General Charges $OT Visit: 1 Visit OT Treatments $Self Care/Home Management : 8-22 mins  Ivor Messier, OT  Acute Rehabilitation Services Office [phone] Secure chat preferred   Marilynne Drivers 02/18/2024, 2:08 PM

## 2024-02-18 NOTE — Progress Notes (Addendum)
 [facility] PROGRESS NOTE    Progress Note  Jeremy [name]  MVH:[id number] DOB: [birth year] DOA: 02/13/2024 PCP: Everrett Coombe, DO     Brief Narrative:   Jeremy [name] is an 83 y.o. male past medical history of COPD on 3 L of oxygen, obstructive sleep apnea on CPAP, end-stage renal disease on hemodialysis, HFpEF, history of bladder cancer into the ED after mechanical fall after getting entangled with a rug, imaging showed femoral fracture  Assessment/Plan:   Right femoral fracture (HCC) Status post ORIF on 02/14/2024. Continue narcotics, aspirin for DVT prophylaxis per Ortho. PT OT evaluated the patient, will skilled nursing facility.  Acute metabolic encephalopathy in the setting of possible aspiration pneumonia: Chest x-ray showed right lower lobe opacity concerning for pneumonia. There is a concern about aspiration. Started on IV Rocephin and has defervesced. Transition to oral Augmentin Leukocytosis has resolved. Currently on a dysphagia diet with nectar thick.  Mild acute on chronic respiratory failure on 3 L of oxygen: Not in exacerbation. He was continued on home inhalers. Oxygen needs increased overnight 3-4L. Likely due to aspiration episode. Wife would like to avoid melatonin and Seroquel.  Reinforced use of CPAP.  Anemia of chronic renal disease/end-stage renal disease: Postoperatively 7.6. He remains asymptomatic.  Try to keep hemoglobin greater than 7.  Chronic HFpEF: Fluid management per dialysis. Continue metoprolol.  End-stage renal disease on hemodialysis Tuesday Thursdays and Saturdays: Continue further management per renal.  Sleep apnea: Continue CPAP.  DVT prophylaxis: lovenox Family Communication: Wife Status is: Inpatient Remains inpatient appropriate because: Right hip fracture    Code Status:     Code Status Orders  (From admission, onward)           Start     Ordered   02/13/24 2114  Full code  Continuous       Question:   By:  Answer:  Consent: discussion documented in EHR   02/13/24 2113           Code Status History     Date Active Date Inactive Code Status Order ID Comments User Context   08/10/2023 1802 08/19/2023 1853 Full Code [id number]  Duayne Cal, NP ED   08/10/2023 1715 08/10/2023 1802 Full Code [id number]  Rondel Baton, MD ED   09/12/2022 1936 09/17/2022 2213 Full Code [id number]  Elgergawy, Leana Roe, MD Inpatient   03/03/2018 0152 03/04/2018 1827 Full Code [id number]  Lorretta Harp, MD Inpatient   08/25/2017 1622 08/26/2017 1133 Full Code [id number]  Barnett Abu, MD Inpatient   07/29/2013 1742 07/30/2013 1854 Full Code [id number]  Alba Cory, MD Inpatient         IV Access:   Peripheral IV   Procedures and diagnostic studies:   DG CHEST PORT 1 VIEW Result Date: 02/17/2024 CLINICAL DATA:  [id number] Respirations compromised [id number] EXAM: PORTABLE CHEST 1 VIEW COMPARISON:  Chest x-ray 02/13/2024 FINDINGS: The heart and mediastinal contours are unchanged. Atherosclerotic plaque. Question developing left base airspace opacity. Chronic coarsened smoking with no overt pulmonary edema. No pleural effusion. No pneumothorax. No acute osseous abnormality. IMPRESSION: 1. Question developing left base airspace opacity. Recommend repeat PA and lateral view of the chest for further evaluation. 2. Aortic Atherosclerosis (ICD10-I70.0) and Emphysema (ICD10-J43.9). Electronically Signed   By: Tish Frederickson M.D.   On: 02/17/2024 03:33     Medical Consultants:   None.   Subjective:    Jeremy [name] relates he feels well this morning.  Objective:    Vitals:  02/18/24 0442 02/18/24 0459 02/18/24 0532 02/18/24 0715  BP: (!) 164/79   (!) 152/67  Pulse: 82   77  Resp: 16     Temp: 97.7 F (36.5 C)   97.9 F (36.6 C)  TempSrc:    Oral  SpO2: 90% 92% 92% 95%  Weight:      Height:       SpO2: 95 % O2 Flow Rate (L/min): 4 L/min FiO2 (%): 36 %   Intake/Output Summary (Last 24 hours) at  02/18/2024 0809 Last data filed at 02/17/2024 1500 Gross per 24 hour  Intake 338.7 ml  Output 72 ml  Net 266.7 ml   Filed Weights   02/14/24 1908 02/17/24 0920 02/17/24 1235  Weight: 72 kg 73 kg 71 kg    Exam: General exam: In no acute distress. Respiratory system: Good air movement and clear to auscultation. Cardiovascular system: S1 & S2 heard, RRR. No JVD. Gastrointestinal system: Abdomen is nondistended, soft and nontender.  Extremities: No pedal edema. Skin: No rashes, lesions or ulcers Psychiatry: Judgement and insight appear normal. Mood & affect appropriate.    Data Reviewed:    Labs: Basic Metabolic Panel: Recent Labs  Lab 02/14/24 0523 02/15/24 0548 02/17/24 0642 02/17/24 0805 02/18/24 0549  NA 142 135 136 136 134*  K 5.1 4.8 5.1 5.2* 4.4  CL 99 95* 94* 93* 93*  CO2 29 28 25 25 27   GLUCOSE 91 93 105* 114* 93  BUN 50* 27* 75* 76* 49*  CREATININE 7.32* 4.58* 8.39* 8.47* 5.58*  CALCIUM 8.6* 8.4* 8.1* 8.3* 8.5*  MG 2.2  --   --   --   --   PHOS 7.1*  --   --  7.4*  --    GFR Estimated Creatinine Clearance: 10.1 mL/min (A) (by C-G formula based on SCr of 5.58 mg/dL (H)). Liver Function Tests: Recent Labs  Lab 02/14/24 0523 02/17/24 0805  ALBUMIN 3.1* 2.9*   No results for input(s): "LIPASE", "AMYLASE" in the last 168 hours. No results for input(s): "AMMONIA" in the last 168 hours. Coagulation profile Recent Labs  Lab 02/14/24 0523  INR 1.2   COVID-19 Labs  No results for input(s): "DDIMER", "FERRITIN", "LDH", "CRP" in the last 72 hours.  Lab Results  Component Value Date   SARSCOV2NAA NEGATIVE 08/19/2023   SARSCOV2NAA NEGATIVE 08/10/2023    CBC: Recent Labs  Lab 02/14/24 0523 02/15/24 0548 02/17/24 0642 02/17/24 0740 02/18/24 0549  WBC 10.1 9.2 12.5* 12.3* 8.6  HGB 7.6* 7.6* 7.3* 7.7* 7.5*  HCT 24.4* 23.3* 22.0* 23.5* 22.2*  MCV 101.7* 99.6 97.3 97.9 96.5  PLT 248 218 250 260 272   Cardiac Enzymes: No results for input(s):  "CKTOTAL", "CKMB", "CKMBINDEX", "TROPONINI" in the last 168 hours. BNP (last 3 results) No results for input(s): "PROBNP" in the last 8760 hours. CBG: No results for input(s): "GLUCAP" in the last 168 hours. D-Dimer: No results for input(s): "DDIMER" in the last 72 hours. Hgb A1c: No results for input(s): "HGBA1C" in the last 72 hours. Lipid Profile: No results for input(s): "CHOL", "HDL", "LDLCALC", "TRIG", "CHOLHDL", "LDLDIRECT" in the last 72 hours. Thyroid function studies: No results for input(s): "TSH", "T4TOTAL", "T3FREE", "THYROIDAB" in the last 72 hours.  Invalid input(s): "FREET3" Anemia work up: No results for input(s): "VITAMINB12", "FOLATE", "FERRITIN", "TIBC", "IRON", "RETICCTPCT" in the last 72 hours. Sepsis Labs: Recent Labs  Lab 02/15/24 0548 02/17/24 0642 02/17/24 0740 02/18/24 0549  WBC 9.2 12.5* 12.3* 8.6   Microbiology Recent  Results (from the past 240 hours)  Surgical pcr screen     Status: None   Collection Time: 02/14/24  1:19 AM   Specimen: Nasal Mucosa; Nasal Swab  Result Value Ref Range Status   MRSA, PCR NEGATIVE NEGATIVE Final   Staphylococcus aureus NEGATIVE NEGATIVE Final    Comment: (NOTE) The Xpert SA Assay (FDA approved for NASAL specimens in patients 62 years of age and older), is one component of a comprehensive surveillance program. It is not intended to diagnose infection nor to guide or monitor treatment. Performed at [facility], [address] [place], [place] [address]      Medications:    arformoterol  15 mcg Nebulization BID   aspirin  325 mg Oral Daily   budesonide  0.5 mg Nebulization BID   metoprolol tartrate  12.5 mg Oral BID   olopatadine  1 drop Both Eyes BID   polyethylene glycol  17 g Oral BID   revefenacin  175 mcg Nebulization Daily   senna-docusate  1 tablet Oral BID   sevelamer carbonate  2.4 g Oral TID WC   Continuous Infusions:  cefTRIAXone (ROCEPHIN)  IV Stopped (02/17/24 1203)      LOS: 5  days   Marinda Elk  [facility]  02/18/2024, 8:09 AM

## 2024-02-18 NOTE — Evaluation (Signed)
 Clinical/Bedside Swallow Evaluation Patient Details  Name: Jeremy [name] MRN: [id number] Date of Birth: 13-Mar-1941  Today's Date: 02/18/2024 Time: SLP Start Time (ACUTE ONLY): 0905 SLP Stop Time (ACUTE ONLY): 0933 SLP Time Calculation (min) (ACUTE ONLY): 28 min  Past Medical History:  Past Medical History:  Diagnosis Date   Anemia    Cancer (HCC)    patient and family have not been told that he has cancer.   COPD (chronic obstructive pulmonary disease) (HCC)    Enlarged prostate    ESRD on hemodialysis (HCC)    TTS   History of blood transfusion    Hyperlipidemia    Hypertension    Self-catheterizes urinary bladder    pt. does this morning and evening--been doing this since 11/2016   Sleep apnea    not using a cpap machine   Past Surgical History:  Past Surgical History:  Procedure Laterality Date   A/V FISTULAGRAM Left 03/12/2023   Procedure: A/V Fistulagram;  Surgeon: Victorino Sparrow, MD;  Location: [facility] INVASIVE CV LAB;  Service: Cardiovascular;  Laterality: Left;   A/V FISTULAGRAM Left 10/31/2023   Procedure: A/V Fistulagram;  Surgeon: Ethelene Hal, MD;  Location: [facility] INVASIVE CV LAB;  Service: Cardiovascular;  Laterality: Left;   AV FISTULA PLACEMENT Left 10/16/2022   Procedure: LEFT BRACHIOCEPHALIC ARTERIOVENOUS (AV) FISTULA CREATION;  Surgeon: Cephus Shelling, MD;  Location: [facility] OR;  Service: Vascular;  Laterality: Left;   BACK SURGERY     BLADDER TUMOR EXCISION  2015   benign   BLEPHAROPLASTY  12/2018   LIGATION OF COMPETING BRANCHES OF ARTERIOVENOUS FISTULA Left 03/17/2023   Procedure: LEFT ARM FISTULA BRANCH LIGATION;  Surgeon: Victorino Sparrow, MD;  Location: [facility] OR;  Service: Vascular;  Laterality: Left;   LUMBAR LAMINECTOMY/DECOMPRESSION MICRODISCECTOMY Bilateral 08/25/2017   Procedure: Bilateral Lumbar Three- Four, Lumbar Four- Five, Lumbar Five- Sacral One Laminectomy;  Surgeon: Barnett Abu, MD;  Location: [facility] OR;  Service: Neurosurgery;  Laterality: Bilateral;   Bilateral L3-4 L4-5 L5-S1 Laminectomy   ORIF HIP FRACTURE Right 02/14/2024   Procedure: OPEN REDUCTION INTERNAL FIXATION HIP;  Surgeon: Huel Cote, MD;  Location: [facility] OR;  Service: Orthopedics;  Laterality: Right;  RIGHT COMPRESSION HIP   SKIN CANCER EXCISION     Surg x2...   TONSILLECTOMY AND ADENOIDECTOMY     Surg as a child...   TRANSURETHRAL RESECTION OF BLADDER TUMOR N/A 01/03/2022   Procedure: TRANSURETHRAL RESECTION OF BLADDER TUMOR (TURBT)/ CYSTOSCOPY/ EXAM UNDER ANESTHESIA, BILATERAL RETROGRADE/ BLADDER BIOPSIES;  Surgeon: Heloise Purpura, MD;  Location: [facility] ORS;  Service: Urology;  Laterality: N/A;  GENERAL ANESTHESIA WITH PARALYSIS   HPI:  Patient is an 83 year old male presenting with femoral fx after a mechanical fall. Patient with PMH of COPD, obstructive sleep apnea (on CPAP) end-stage renal disease on hemodialysis, HFpEF, hx of bladder cancer. Patient seen for MBS on 12/08/23 recommending D3/NTL. Patient with "silent aspiration (PAS 8) of nectar thick liquids during initial few sips and one instance of questionable silent aspiration with thin liquids (shoulder obscured view during at times). Subsequent sips of nectar thick liquids did not result in aspiration" Recent CXR notes "Question developing left base airspace opacity. Recommend repeat  PA and lateral view of the chest for further evaluation."    Assessment / Plan / Recommendation  Clinical Impression  A bedside swallow evaluation was utilized to assess for s/sx of oropharyngeal dysphagia. Oral mechanism revealed some missing dentition, otherwise WFL. Patient with consumption of POs including NTL  via cup, purees and solids. Patient with adequate mastication times and oral clearance of residuals. Patient with x2 delayed coughs after completion of PO intake which may be due to bolus misdirection of solids or liquid wash. Due to patients recent deconditioning, hx of dysphagia and s/sx of aspiration during BSE, recommend completion of  MBS as scheduling permits. Until MBS can be completed, recommend continuation of current diet (dysphagia 3/NTL) with medications given whole in puree.   SLP Visit Diagnosis: Dysphagia, unspecified (R13.10)    Aspiration Risk  Mild aspiration risk    Diet Recommendation Dysphagia 3 (Mech soft);Nectar-thick liquid    Liquid Administration via: Cup Medication Administration: Whole meds with puree Supervision: Intermittent supervision to cue for compensatory strategies;Staff to assist with self feeding Compensations: Minimize environmental distractions;Slow rate;Small sips/bites Postural Changes: Seated upright at 90 degrees    Other  Recommendations Oral Care Recommendations: Oral care BID    Recommendations for follow up therapy are one component of a multi-disciplinary discharge planning process, led by the attending physician.  Recommendations may be updated based on patient status, additional functional criteria and insurance authorization.  Follow up Recommendations Skilled nursing-short term rehab (<3 hours/day)      Functional Status Assessment Patient has had a recent decline in their functional status and demonstrates the ability to make significant improvements in function in a reasonable and predictable amount of time.  Frequency and Duration min 2x/week  3 weeks       Prognosis Prognosis for improved oropharyngeal function: Good Barriers/Prognosis Comment: n/a      Swallow Study   General HPI: Patient is an 83 year old male presenting with femoral fx after a mechanical fall. Patient with PMH of COPD, obstructive sleep apnea (on CPAP) end-stage renal disease on hemodialysis, HFpEF, hx of bladder cancer. Patient seen for MBS on 12/08/23 recommending D3/NTL. Patient with "silent aspiration (PAS 8) of nectar thick liquids during initial few sips and one instance of questionable silent aspiration with thin liquids (shoulder obscured view during at times). Subsequent sips of  nectar thick liquids did not result in aspiration" Recent CXR notes "Question developing left base airspace opacity. Recommend repeat  PA and lateral view of the chest for further evaluation." Type of Study: Bedside Swallow Evaluation Previous Swallow Assessment: MBS 1/27 Diet Prior to this Study: Dysphagia 3 (mechanical soft);Mildly thick liquids (Level 2, nectar thick) Respiratory Status: Nasal cannula (4L) Behavior/Cognition: Cooperative;Lethargic/Drowsy Oral Cavity Assessment: Within Functional Limits Oral Care Completed by SLP: No Oral Cavity - Dentition: Missing dentition Vision: Functional for self-feeding Self-Feeding Abilities: Needs assist Patient Positioning: Upright in bed    Oral/Motor/Sensory Function Overall Oral Motor/Sensory Function: Within functional limits   Ice Chips Ice chips: Not tested   Thin Liquid Thin Liquid: Not tested    Nectar Thick Nectar Thick Liquid: Within functional limits Presentation: Cup;Self Fed   Honey Thick Honey Thick Liquid: Not tested   Puree Puree: Within functional limits Presentation: Self Fed;Spoon   Solid     Solid: Impaired Presentation: Self Fed Pharyngeal Phase Impairments: Cough - Delayed      Aloysious Vangieson M.A., CCC-SLP 02/18/2024,10:48 AM

## 2024-02-18 NOTE — Progress Notes (Signed)
 Physical Therapy Treatment Patient Details Name: Jeremy [name] MRN: [id number] DOB: [birth year] Today's Date: 02/18/2024   History of Present Illness Patient is 83 y.o. male who presented to the ED on 02/14/24 after a mechanical fall from tripping on a rug. Work up revealed right femoral fracture, pt now s/p Rt LE ORIF 02/14/24.     PMH includes chronic respiratory failure, ESRD, tobacco abuse, OSA, HTN, HLD.    PT Comments  Pt in bed upon arrival and agreeable to PT session. Worked on transfers and LE strength in today's session. Pt continues to need ModA to stand with a RW with cues for hand placement. Pt has an initial posterior lean requiring cues and assist to correct. He was able to take short steps towards the recliner with ModA for balance and cues for sequencing. Pt is progressing towards goals. Pt would continue to benefit from <3hrs post acute rehab to work towards independence with mobility. Acute PT to follow.      If plan is discharge home, recommend the following: Two people to help with walking and/or transfers;Two people to help with bathing/dressing/bathroom;Assist for transportation;Help with stairs or ramp for entrance;Supervision due to cognitive status;Direct supervision/assist for medications management;Assistance with cooking/housework   Can travel by private vehicle     No  Equipment Recommendations  BSC/3in1       Precautions / Restrictions Precautions Precautions: Fall Recall of Precautions/Restrictions: Impaired Restrictions Weight Bearing Restrictions Per Provider Order: Yes RLE Weight Bearing Per Provider Order: Weight bearing as tolerated     Mobility  Bed Mobility Overal bed mobility: Needs Assistance Bed Mobility: Supine to Sit    Supine to sit: Mod assist, HOB elevated    General bed mobility comments: ModA to guide R LE towards EOB and slightly assist with elevating trunk    Transfers Overall transfer level: Needs assistance Equipment used: Rolling  walker (2 wheels) Transfers: Sit to/from Stand, Bed to chair/wheelchair/BSC Sit to Stand: Mod assist, From elevated surface   Step pivot transfers: Mod assist, From elevated surface    General transfer comment: ModA for boost-up and steadying with pt initially leaning posteriorly. Cues for hand placement and sequencing to take steps towards recliner    Ambulation/Gait    Pre-gait activities: stepping in place, two sets x5 reps bilaterally General Gait Details: unable this date     Balance Overall balance assessment: Needs assistance, History of Falls Sitting-balance support: Feet supported, No upper extremity supported Sitting balance-Leahy Scale: Fair   Postural control: Posterior lean Standing balance support: Bilateral upper extremity supported, During functional activity, Reliant on assistive device for balance Standing balance-Leahy Scale: Poor Standing balance comment: reliant on RW, initial posterior lean upon standing       Communication Communication Communication: Impaired Factors Affecting Communication: Hearing impaired  Cognition Arousal: Alert Behavior During Therapy: WFL for tasks assessed/performed   PT - Cognitive impairments: Memory, Initiation, Sequencing, Problem solving, Safety/Judgement    PT - Cognition Comments: Difficulty with sequencing motor tasks and remembering prior cues for hand placement. Following commands: Impaired Following commands impaired: Follows one step commands with increased time, Only follows one step commands consistently    Cueing Cueing Techniques: Verbal cues, Tactile cues, Gestural cues  Exercises General Exercises - Lower Extremity Long Arc Quad: AROM, Both, 10 reps, Seated Other Exercises Other Exercises: x3 STS with ModA        Pertinent Vitals/Pain Pain Assessment Pain Assessment: No/denies pain     PT Goals (current goals can now be found  in the care plan section) Acute Rehab PT Goals PT Goal Formulation:  With patient/family Time For Goal Achievement: 02/29/24 Potential to Achieve Goals: Good Progress towards PT goals: Progressing toward goals    Frequency    Min 2X/week       AM-PAC PT "6 Clicks" Mobility   Outcome Measure  Help needed turning from your back to your side while in a flat bed without using bedrails?: A Lot Help needed moving from lying on your back to sitting on the side of a flat bed without using bedrails?: A Lot Help needed moving to and from a bed to a chair (including a wheelchair)?: A Lot Help needed standing up from a chair using your arms (e.g., wheelchair or bedside chair)?: A Lot Help needed to walk in hospital room?: Total Help needed climbing 3-5 steps with a railing? : Total 6 Click Score: 10    End of Session Equipment Utilized During Treatment: Gait belt Activity Tolerance: Patient tolerated treatment well Patient left: with call bell/phone within reach;with family/visitor present;in chair;with chair alarm set Nurse Communication: Mobility status PT Visit Diagnosis: Other abnormalities of gait and mobility (R26.89);Difficulty in walking, not elsewhere classified (R26.2);Other symptoms and signs involving the nervous system (R29.898);Unsteadiness on feet (R26.81);History of falling (Z91.81)     Time: 0981-1914 PT Time Calculation (min) (ACUTE ONLY): 24 min  Charges:    $Therapeutic Exercise: 8-22 mins $Therapeutic Activity: 8-22 mins PT General Charges $$ ACUTE PT VISIT: 1 Visit                     Jeremy [name], PT, DPT Secure Chat Preferred  Rehab Office [phone]   Jeremy [name] Jeremy [name] 02/18/2024, 11:27 AM

## 2024-02-18 NOTE — Progress Notes (Signed)
 PHARMACY NOTE:  ANTIMICROBIAL RENAL DOSAGE ADJUSTMENT  Current antimicrobial regimen includes a mismatch between antimicrobial dosage and estimated renal function.  As per policy approved by the Pharmacy & Therapeutics and Medical Executive Committees, the antimicrobial dosage will be adjusted accordingly.  Current antimicrobial dosage:  amox/claav 875-125mg  BID  Indication: pneumonia  Renal Function:  Estimated Creatinine Clearance: 10.1 mL/min (A) (by C-G formula based on SCr of 5.58 mg/dL (H)). [x]      On intermittent HD, scheduled:TTS []      On CRRT    Antimicrobial dosage has been changed to:  500-125mg  daily     Thank you for allowing pharmacy to be a part of this patient's care.  Alphia Moh, PharmD, BCPS, BCCP Clinical Pharmacist  Please check AMION for all [facility] Pharmacy phone numbers After 10:00 PM, call Main Pharmacy [phone]

## 2024-02-18 NOTE — Plan of Care (Signed)
  Problem: Clinical Measurements: Goal: Diagnostic test results will improve Outcome: Not Progressing   Problem: Activity: Goal: Risk for activity intolerance will decrease Outcome: Not Progressing   Problem: Elimination: Goal: Will not experience complications related to bowel motility Outcome: Not Progressing   Problem: Elimination: Goal: Will not experience complications related to urinary retention Outcome: Not Progressing   Problem: Safety: Goal: Ability to remain free from injury will improve Outcome: Not Progressing

## 2024-02-18 NOTE — Progress Notes (Signed)
       Overnight   NAME: Jeremy [name] MRN: [id number] DOB : September 07, 1941  (Remote notification)  Date of Service   02/18/2024   HPI/Events of Note    Notified by RN for Rapid Response for lethargy and complaint of difficulty breathing/airway concerns.  RR RN was on site requesting bedside Physician whom responded to bedside.. NTS suction was employed and oxygenation/mentation improved, although minimally.  Patient was relayed as suitable for CPAP at bedside visit , which was utilized with success.  The same type occurrence was reiterated by RN for previous night. Both Seroquel and Melatonin may be contributory to exacerbation of OSA of which he has a CPAP ordered. For this reason these meds have been added to the "Allergy/Contraindication List" by Nursing.     Interventions/ Plan   Avoid Melatonin and Seroquel for now. Reinforce nightly use of CPAP. Follow all previous Attending orders.      Chinita Greenland BSN MSNA MSN ACNPC-AG Acute Care Nurse Practitioner [facility]

## 2024-02-19 DIAGNOSIS — J449 Chronic obstructive pulmonary disease, unspecified: Secondary | ICD-10-CM | POA: Diagnosis not present

## 2024-02-19 DIAGNOSIS — N186 End stage renal disease: Secondary | ICD-10-CM | POA: Diagnosis not present

## 2024-02-19 DIAGNOSIS — J9611 Chronic respiratory failure with hypoxia: Secondary | ICD-10-CM | POA: Diagnosis not present

## 2024-02-19 DIAGNOSIS — S7291XA Unspecified fracture of right femur, initial encounter for closed fracture: Secondary | ICD-10-CM | POA: Diagnosis not present

## 2024-02-19 LAB — CBC
HCT: 22.2 % — ABNORMAL LOW (ref 39.0–52.0)
Hemoglobin: 7.4 g/dL — ABNORMAL LOW (ref 13.0–17.0)
MCH: 32 pg (ref 26.0–34.0)
MCHC: 33.3 g/dL (ref 30.0–36.0)
MCV: 96.1 fL (ref 80.0–100.0)
Platelets: 310 10*3/uL (ref 150–400)
RBC: 2.31 MIL/uL — ABNORMAL LOW (ref 4.22–5.81)
RDW: 12.9 % (ref 11.5–15.5)
WBC: 8.9 10*3/uL (ref 4.0–10.5)
nRBC: 0 % (ref 0.0–0.2)

## 2024-02-19 LAB — BASIC METABOLIC PANEL WITH GFR
Anion gap: 17 — ABNORMAL HIGH (ref 5–15)
BUN: 77 mg/dL — ABNORMAL HIGH (ref 8–23)
CO2: 26 mmol/L (ref 22–32)
Calcium: 8.5 mg/dL — ABNORMAL LOW (ref 8.9–10.3)
Chloride: 90 mmol/L — ABNORMAL LOW (ref 98–111)
Creatinine, Ser: 7.26 mg/dL — ABNORMAL HIGH (ref 0.61–1.24)
GFR, Estimated: 7 mL/min — ABNORMAL LOW (ref >60)
Glucose, Bld: 89 mg/dL (ref 70–99)
Potassium: 5.5 mmol/L — ABNORMAL HIGH (ref 3.5–5.1)
Sodium: 133 mmol/L — ABNORMAL LOW (ref 135–145)

## 2024-02-19 MED ORDER — PROSOURCE PLUS PO LIQD
30.0000 mL | Freq: Two times a day (BID) | ORAL | Status: DC
  Administered 2024-02-20 – 2024-02-23 (×6): 30 mL via ORAL
  Filled 2024-02-19 (×7): qty 30

## 2024-02-19 NOTE — Progress Notes (Signed)
 [facility] PROGRESS NOTE    Progress Note  Jeremy [name]  ZOX:[id number] DOB: 11/21/1940 DOA: 02/13/2024 PCP: Everrett Coombe, DO     Brief Narrative:   Jeremy [name] is an 83 y.o. male past medical history of COPD on 3 L of oxygen, obstructive sleep apnea on CPAP, end-stage renal disease on hemodialysis, HFpEF, history of bladder cancer into the ED after mechanical fall after getting entangled with a rug, imaging showed femoral fracture  Assessment/Plan:   Right femoral fracture: Status post ORIF on 02/14/2024. Continue narcotics, aspirin for DVT prophylaxis per Ortho. PT OT evaluated the patient, will skilled nursing facility.  Acute metabolic encephalopathy in the setting of  aspiration pneumonia: Chest x-ray showed right lower lobe opacity concerning for pneumonia. Continue oral Augmentin Currently on a dysphagia diet with nectar thick. Speech consulted for MBS  Mild acute on chronic respiratory failure on 3 L of oxygen: Not in exacerbation. He was continued on home inhalers. Now requiring 5 L oxygen to keep saturations greater 92%. Likely due to aspiration episode. Wife would like to avoid melatonin and Seroquel.  Reinforced use of CPAP.  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 He remains asymptomatic.  Try to keep hemoglobin greater than 7.  Chronic HFpEF: Fluid management per dialysis. Continue metoprolol.  Hyperkalemia: Further management per renal.  End-stage renal disease on hemodialysis Tuesday Thursdays and Saturdays: Continue further management per renal.  Sleep apnea: Continue CPAP.  DVT prophylaxis: lovenox Family Communication: Wife Status is: Inpatient Remains inpatient appropriate because: Right hip fracture    Code Status:     Code Status Orders  (From admission, onward)           Start     Ordered   02/13/24 2114  Full code  Continuous       Question:  By:  Answer:  Consent: discussion  documented in EHR   02/13/24 2113           Code Status History     Date Active Date Inactive Code Status Order ID Comments User Context   08/10/2023 1802 08/19/2023 1853 Full Code [id number]  Duayne Cal, NP ED   08/10/2023 1715 08/10/2023 1802 Full Code [id number]  Rondel Baton, MD ED   09/12/2022 1936 09/17/2022 2213 Full Code [id number]  Elgergawy, Leana Roe, MD Inpatient   03/03/2018 0152 03/04/2018 1827 Full Code [id number]  Lorretta Harp, MD Inpatient   08/25/2017 1622 08/26/2017 1133 Full Code [id number]  Barnett Abu, MD Inpatient   07/29/2013 1742 07/30/2013 1854 Full Code [id number]  Alba Cory, MD Inpatient         IV Access:   Peripheral IV   Procedures and diagnostic studies:   No results found.    Medical Consultants:   None.   Subjective:    Jeremy [name] more awake today relates he feels better.  Objective:    Vitals:   02/19/24 0000 02/19/24 0116 02/19/24 0520 02/19/24 0701  BP:    (!) 150/54  Pulse: 76   77  Resp: 18 16 16 16   Temp:    98 F (36.7 C)  TempSrc:      SpO2: 95% 94% 92% 94%  Weight:      Height:       SpO2: 94 % O2 Flow Rate (L/min): 5 L/min FiO2 (%): 36 %   Intake/Output Summary (Last 24 hours) at 02/19/2024 0710 Last data filed at 02/19/2024 0547 Gross per 24 hour  Intake  120 ml  Output 390 ml  Net -270 ml   Filed Weights   02/14/24 1908 02/17/24 0920 02/17/24 1235  Weight: 72 kg 73 kg 71 kg    Exam: General exam: In no acute distress. Respiratory system: Good air movement and clear to auscultation. Cardiovascular system: S1 & S2 heard, RRR. No JVD. Gastrointestinal system: Abdomen is nondistended, soft and nontender.  Extremities: No pedal edema. Skin: No rashes, lesions or ulcers Psychiatry: Judgement and insight appear normal. Mood & affect appropriate.  Data Reviewed:    Labs: Basic Metabolic Panel: Recent Labs  Lab 02/14/24 0523 02/15/24 0548 02/17/24 1610 02/17/24 0805 02/18/24 0549  02/19/24 0538  NA 142 135 136 136 134* 133*  K 5.1 4.8 5.1 5.2* 4.4 5.5*  CL 99 95* 94* 93* 93* 90*  CO2 29 28 25 25 27 26   GLUCOSE 91 93 105* 114* 93 89  BUN 50* 27* 75* 76* 49* 77*  CREATININE 7.32* 4.58* 8.39* 8.47* 5.58* 7.26*  CALCIUM 8.6* 8.4* 8.1* 8.3* 8.5* 8.5*  MG 2.2  --   --   --   --   --   PHOS 7.1*  --   --  7.4*  --   --    GFR Estimated Creatinine Clearance: 7.7 mL/min (A) (by C-G formula based on SCr of 7.26 mg/dL (H)). Liver Function Tests: Recent Labs  Lab 02/14/24 0523 02/17/24 0805  ALBUMIN 3.1* 2.9*   No results for input(s): "LIPASE", "AMYLASE" in the last 168 hours. No results for input(s): "AMMONIA" in the last 168 hours. Coagulation profile Recent Labs  Lab 02/14/24 0523  INR 1.2   COVID-19 Labs  No results for input(s): "DDIMER", "FERRITIN", "LDH", "CRP" in the last 72 hours.  Lab Results  Component Value Date   SARSCOV2NAA NEGATIVE 08/19/2023   SARSCOV2NAA NEGATIVE 08/10/2023    CBC: Recent Labs  Lab 02/15/24 0548 02/17/24 0642 02/17/24 0740 02/18/24 0549 02/19/24 0538  WBC 9.2 12.5* 12.3* 8.6 8.9  HGB 7.6* 7.3* 7.7* 7.5* 7.4*  HCT 23.3* 22.0* 23.5* 22.2* 22.2*  MCV 99.6 97.3 97.9 96.5 96.1  PLT 218 250 260 272 310   Cardiac Enzymes: No results for input(s): "CKTOTAL", "CKMB", "CKMBINDEX", "TROPONINI" in the last 168 hours. BNP (last 3 results) No results for input(s): "PROBNP" in the last 8760 hours. CBG: No results for input(s): "GLUCAP" in the last 168 hours. D-Dimer: No results for input(s): "DDIMER" in the last 72 hours. Hgb A1c: No results for input(s): "HGBA1C" in the last 72 hours. Lipid Profile: No results for input(s): "CHOL", "HDL", "LDLCALC", "TRIG", "CHOLHDL", "LDLDIRECT" in the last 72 hours. Thyroid function studies: No results for input(s): "TSH", "T4TOTAL", "T3FREE", "THYROIDAB" in the last 72 hours.  Invalid input(s): "FREET3" Anemia work up: No results for input(s): "VITAMINB12", "FOLATE",  "FERRITIN", "TIBC", "IRON", "RETICCTPCT" in the last 72 hours. Sepsis Labs: Recent Labs  Lab 02/17/24 9604 02/17/24 0740 02/18/24 0549 02/19/24 0538  WBC 12.5* 12.3* 8.6 8.9   Microbiology Recent Results (from the past 240 hours)  Surgical pcr screen     Status: None   Collection Time: 02/14/24  1:19 AM   Specimen: Nasal Mucosa; Nasal Swab  Result Value Ref Range Status   MRSA, PCR NEGATIVE NEGATIVE Final   Staphylococcus aureus NEGATIVE NEGATIVE Final    Comment: (NOTE) The Xpert SA Assay (FDA approved for NASAL specimens in patients 25 years of age and older), is one component of a comprehensive surveillance program. It is not intended to diagnose  infection nor to guide or monitor treatment. Performed at [facility], [address], [place], [place] [address]      Medications:    amoxicillin-clavulanate  1 tablet Oral QHS   arformoterol  15 mcg Nebulization BID   aspirin  325 mg Oral Daily   budesonide  0.5 mg Nebulization BID   darbepoetin (ARANESP) injection - DIALYSIS  60 mcg Subcutaneous Q Wed-1800   metoprolol tartrate  12.5 mg Oral BID   olopatadine  1 drop Both Eyes BID   polyethylene glycol  17 g Oral BID   revefenacin  175 mcg Nebulization Daily   senna-docusate  1 tablet Oral BID   sevelamer carbonate  2.4 g Oral TID WC   Continuous Infusions:      LOS: 6 days   Marinda Elk  [facility]  02/19/2024, 7:10 AM

## 2024-02-19 NOTE — Progress Notes (Signed)
 Speech Language Pathology Treatment: Dysphagia  Patient Details Name: Jeremy [name] MRN: [id number] DOB: May 19, 1941 Today's Date: 02/19/2024 Time: 1025-1050 SLP Time Calculation (min) (ACUTE ONLY): 25 min  Assessment / Plan / Recommendation Clinical Impression  Skilled therapy session focused on dysphagia education. SLP provided education regarding MBS results and recommendations to patients wife (dysphagia 2/ HTL via tsp w/ chin tuck). Patients wife verbalized understanding of recommendations with questions regarding food choices available to patient. SLP provided in depth explanation to textures allowed and importance of utilizing strategies during consumption. SLP re-enforced recommendations for full supervision during meals to encourage use of strategies and terminate meal if patient becomes fatigued. SLP educated patient/wife that while current diet is safest per MBS results, it does not ensure aspiration will not occur. Patients wife verbalized understanding. After conversation, patients family decided to continue with current diet during inpatient stay with plan to repeat MBS as indicated. Patients family to consider pleasure feeding vs continuation of safest diet per instrumental.    HPI HPI: Patient is an 83 year old male presenting with femoral fx after a mechanical fall. Patient with PMH of COPD, obstructive sleep apnea (on CPAP) end-stage renal disease on hemodialysis, HFpEF, hx of bladder cancer. Patient seen for MBS on 12/08/23 recommending D3/NTL. Patient with "silent aspiration (PAS 8) of nectar thick liquids during initial few sips and one instance of questionable silent aspiration with thin liquids (shoulder obscured view during at times). Subsequent sips of nectar thick liquids did not result in aspiration" Recent CXR notes "Question developing left base airspace opacity. Recommend repeat  PA and lateral view of the chest for further evaluation."      SLP Plan  Continue with current  plan of care      Recommendations for follow up therapy are one component of a multi-disciplinary discharge planning process, led by the attending physician.  Recommendations may be updated based on patient status, additional functional criteria and insurance authorization.    Recommendations  Diet recommendations: Dysphagia 2 (fine chop);Honey-thick liquid (chin tuck) Liquids provided via: Teaspoon Medication Administration: Crushed with puree Supervision: Full supervision/cueing for compensatory strategies Compensations: Minimize environmental distractions;Slow rate;Small sips/bites;Clear throat intermittently;Chin tuck Postural Changes and/or Swallow Maneuvers: Seated upright 90 degrees        Oral care BID   Frequent or constant Supervision/Assistance Dysphagia, oropharyngeal phase (R13.12)     Continue with current plan of care     Mattis Featherly M.A., CCC-SLP 02/19/2024, 11:06 AM

## 2024-02-19 NOTE — Progress Notes (Signed)
  [facility] Progress Note   Subjective:  Seen in room - wife in room. She reports he did much better overnight, no dyspnea issues. Mucus is better today. For HD later today.   Objective Vitals:   02/19/24 0728 02/19/24 0733 02/19/24 0735 02/19/24 0831  BP:      Pulse:      Resp:      Temp:    98 F (36.7 C)  TempSrc:    Oral  SpO2: 94% 94% 94%   Weight:      Height:       Physical Exam General: Elderly man, NAD. [place] O2 in place Heart: RRR; 2/6 murmur Lungs: CTA anteriorly Abdomen: soft Extremities: no LE edema Dialysis Access: LUE AVF +t/b    Additional Objective Labs: Basic Metabolic Panel: Recent Labs  Lab 02/14/24 0523 02/15/24 0548 02/17/24 0805 02/18/24 0549 02/19/24 0538  NA 142   < > 136 134* 133*  K 5.1   < > 5.2* 4.4 5.5*  CL 99   < > 93* 93* 90*  CO2 29   < > 25 27 26   GLUCOSE 91   < > 114* 93 89  BUN 50*   < > 76* 49* 77*  CREATININE 7.32*   < > 8.47* 5.58* 7.26*  CALCIUM 8.6*   < > 8.3* 8.5* 8.5*  PHOS 7.1*  --  7.4*  --   --    < > = values in this interval not displayed.   Liver Function Tests: Recent Labs  Lab 02/14/24 0523 02/17/24 0805  ALBUMIN 3.1* 2.9*   CBC: Recent Labs  Lab 02/15/24 0548 02/17/24 0642 02/17/24 0740 02/18/24 0549 02/19/24 0538  WBC 9.2 12.5* 12.3* 8.6 8.9  HGB 7.6* 7.3* 7.7* 7.5* 7.4*  HCT 23.3* 22.0* 23.5* 22.2* 22.2*  MCV 99.6 97.3 97.9 96.5 96.1  PLT 218 250 260 272 310   Medications:   amoxicillin-clavulanate  1 tablet Oral QHS   arformoterol  15 mcg Nebulization BID   aspirin  325 mg Oral Daily   budesonide  0.5 mg Nebulization BID   darbepoetin (ARANESP) injection - DIALYSIS  60 mcg Subcutaneous Q Wed-1800   metoprolol tartrate  12.5 mg Oral BID   olopatadine  1 drop Both Eyes BID   polyethylene glycol  17 g Oral BID   revefenacin  175 mcg Nebulization Daily   senna-docusate  1 tablet Oral BID   sevelamer carbonate  2.4 g Oral TID WC    Dialysis Orders TTS - AF 3hr,  400/600, EDW 68.5kg, 2K/2Ca, UFP #2, LUE AVF, heparin 2500 unit q HD - no ESA d/t bladder cancer - Getting course of IV iron - Hectoral IV q HD   Assessment/Plan: R femoral neck Fx: S/p 3 screws surgically placed 4/5. ESRD: Continue HD on TTS schedule - for HD today. Pneumonia: Per CXR, L base infiltrate. Productive cough. On Augmentin. Better today. HTN/volume: BP high, no edema on exam - above prior EDW. 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  Hx bladder cancer- > but s/p TURBT 12/2021. No contraindication to ESA at this time - after discussion with family, was resumed, Aranesp given 4/9. Secondary HPTH: Ca ok, Phos a little high - continue binders. Resume VDRA as outpatient. Nutrition: Alb low, adding protein supplements. COPD: Uses O2 at baseline. Dispo: SNF placement pending.   Ozzie Hoyle, PA-C 02/19/2024, 10:41 AM  [employer]

## 2024-02-19 NOTE — Plan of Care (Signed)
  Problem: Education: Goal: Knowledge of General Education information will improve Description: Including pain rating scale, medication(s)/side effects and non-pharmacologic comfort measures Outcome: Progressing   Problem: Health Behavior/Discharge Planning: Goal: Ability to manage health-related needs will improve Outcome: Progressing   Problem: Clinical Measurements: Goal: Ability to maintain clinical measurements within normal limits will improve Outcome: Progressing Goal: Will remain free from infection Outcome: Progressing Goal: Diagnostic test results will improve Outcome: Progressing Goal: Respiratory complications will improve Outcome: Progressing Goal: Cardiovascular complication will be avoided Outcome: Progressing   Problem: Activity: Goal: Risk for activity intolerance will decrease Outcome: Progressing   Problem: Coping: Goal: Level of anxiety will decrease Outcome: Progressing   Problem: Elimination: Goal: Will not experience complications related to urinary retention Outcome: Progressing   Problem: Pain Managment: Goal: General experience of comfort will improve and/or be controlled Outcome: Progressing

## 2024-02-19 NOTE — Progress Notes (Signed)
 Mobility Specialist Progress Note:    02/19/24 1028  Mobility  Activity Transferred to/from [facility]  Level of Assistance Moderate assist, patient does 50-74%  Assistive Device Front wheel walker  Distance Ambulated (ft) 6 ft  RLE Weight Bearing Per Provider Order WBAT  Activity Response Tolerated well  Mobility Referral Yes  Mobility visit 1 Mobility  Mobility Specialist Start Time (ACUTE ONLY) 1005  Mobility Specialist Stop Time (ACUTE ONLY) 1011  Mobility Specialist Time Calculation (min) (ACUTE ONLY) 6 min   Pt received on BSC and agreeable. Required modA to stand. NT assisted w/ peri care. Transfer to chair deferred d/t pt going to HD. No complaints throughout. Pt left in bed with call bell and all needs met. NT and family present.  D'Vante Earlene Plater Mobility Specialist Please contact via Special educational needs teacher or Rehab office at [phone]

## 2024-02-20 ENCOUNTER — Encounter

## 2024-02-20 DIAGNOSIS — S72001A Fracture of unspecified part of neck of right femur, initial encounter for closed fracture: Secondary | ICD-10-CM | POA: Diagnosis not present

## 2024-02-20 LAB — BASIC METABOLIC PANEL WITH GFR
Anion gap: 13 (ref 5–15)
BUN: 47 mg/dL — ABNORMAL HIGH (ref 8–23)
CO2: 29 mmol/L (ref 22–32)
Calcium: 8.7 mg/dL — ABNORMAL LOW (ref 8.9–10.3)
Chloride: 91 mmol/L — ABNORMAL LOW (ref 98–111)
Creatinine, Ser: 4.96 mg/dL — ABNORMAL HIGH (ref 0.61–1.24)
GFR, Estimated: 11 mL/min — ABNORMAL LOW (ref >60)
Glucose, Bld: 90 mg/dL (ref 70–99)
Potassium: 4.8 mmol/L (ref 3.5–5.1)
Sodium: 133 mmol/L — ABNORMAL LOW (ref 135–145)

## 2024-02-20 LAB — CBC
HCT: 24.4 % — ABNORMAL LOW (ref 39.0–52.0)
Hemoglobin: 8 g/dL — ABNORMAL LOW (ref 13.0–17.0)
MCH: 31.7 pg (ref 26.0–34.0)
MCHC: 32.8 g/dL (ref 30.0–36.0)
MCV: 96.8 fL (ref 80.0–100.0)
Platelets: 348 10*3/uL (ref 150–400)
RBC: 2.52 MIL/uL — ABNORMAL LOW (ref 4.22–5.81)
RDW: 13 % (ref 11.5–15.5)
WBC: 7.6 10*3/uL (ref 4.0–10.5)
nRBC: 0 % (ref 0.0–0.2)

## 2024-02-20 NOTE — Progress Notes (Signed)
 [facility] PROGRESS NOTE    Progress Note  Jeremy [name]  WUJ:[id number] DOB: May 30, 1941 DOA: 02/13/2024 PCP: Everrett Coombe, DO     Brief Narrative:  As per prior documentation: "Jeremy [name] is an 83 y.o. male past medical history of COPD on 3 L of oxygen, obstructive sleep apnea on CPAP, end-stage renal disease on hemodialysis, HFpEF, history of bladder cancer into the ED after mechanical fall after getting entangled with a rug, imaging showed femoral fracture"  02/20/2024: Patient seen alongside patient's wife.  Patient continues to improve.  Likely discharge tomorrow after hemodialysis if possible.  Complete course of oral antibiotics.  Assessment/Plan:   Right femoral fracture: Status post ORIF on 02/14/2024. Continue narcotics, aspirin for DVT prophylaxis per Ortho. PT OT evaluated the patient, will skilled nursing facility.  Acute metabolic encephalopathy in the setting of  aspiration pneumonia: Chest x-ray showed right lower lobe opacity concerning for pneumonia. Continue oral Augmentin Currently on a dysphagia diet with nectar thick. Speech consulted for MBS  Mild acute on chronic respiratory failure on 3 L of oxygen: Not in exacerbation. He was continued on home inhalers. Now requiring 5 L oxygen to keep saturations greater 92%. Likely due to aspiration episode. Wife would like to avoid melatonin and Seroquel.  Reinforced use of CPAP.  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 He remains asymptomatic.  Try to keep hemoglobin greater than 7.  Chronic HFpEF: Fluid management per dialysis. Continue metoprolol.  Hyperkalemia: Further management per renal.  End-stage renal disease on hemodialysis Tuesday Thursdays and Saturdays: Continue further management per renal.  Sleep apnea: Continue CPAP.  DVT prophylaxis: lovenox Family Communication: Wife Status is: Inpatient Remains inpatient appropriate because: Right hip  fracture    Code Status:     Code Status Orders  (From admission, onward)           Start     Ordered   02/13/24 2114  Full code  Continuous       Question:  By:  Answer:  Consent: discussion documented in EHR   02/13/24 2113           Code Status History     Date Active Date Inactive Code Status Order ID Comments User Context   08/10/2023 1802 08/19/2023 1853 Full Code [id number]  Duayne Cal, NP ED   08/10/2023 1715 08/10/2023 1802 Full Code [id number]  Rondel Baton, MD ED   09/12/2022 1936 09/17/2022 2213 Full Code [id number]  Elgergawy, Leana Roe, MD Inpatient   03/03/2018 0152 03/04/2018 1827 Full Code [id number]  Lorretta Harp, MD Inpatient   08/25/2017 1622 08/26/2017 1133 Full Code [id number]  Barnett Abu, MD Inpatient   07/29/2013 1742 07/30/2013 1854 Full Code [id number]  Alba Cory, MD Inpatient         IV Access:   Peripheral IV   Procedures and diagnostic studies:   DG Swallowing Func-Speech Pathology Result Date: 02/19/2024 CLINICAL DATA:  Dysphagia. EXAM: MODIFIED BARIUM SWALLOW TECHNIQUE: Different consistencies of barium were administered orally to the patient by the Speech Pathologist. Imaging of the pharynx was performed in the lateral projection. FLUOROSCOPY TIME:  Radiation Exposure Index (as provided by the fluoroscopic device): 38.36 mGy Kerma COMPARISON:  None Available. FINDINGS: Modified barium swallow was performed by the speech pathologist. Radiologist was not involved with this exam. Please refer to the Speech Pathology report for results and recommendations. IMPRESSION: Please refer to the Speech Pathologists report for complete details and recommendations. Electronically Signed  By: Richarda Overlie M.D.   On: 02/19/2024 17:08      Medical Consultants:   Nephrology. Orthopedic team.   Subjective:   No new complaints.   Objective:    Vitals:   02/19/24 2000 02/19/24 2114 02/20/24 0025 02/20/24 0803  BP:  (!) 157/75  (!)  167/72  Pulse:  80 81 78  Resp:  18 18 18   Temp:  98.3 F (36.8 C)  98.8 F (37.1 C)  TempSrc:  Oral  Oral  SpO2: 94% 96% 96% 97%  Weight:      Height:       SpO2: 97 % O2 Flow Rate (L/min): 4 L/min FiO2 (%): 36 %   Intake/Output Summary (Last 24 hours) at 02/20/2024 1238 Last data filed at 02/20/2024 0535 Gross per 24 hour  Intake --  Output 151.5 ml  Net -151.5 ml   Filed Weights   02/19/24 1346 02/19/24 1350 02/19/24 1707  Weight: 70.1 kg 70.1 kg 68.6 kg    Exam: General exam: Awake and alert. Respiratory system: Clear to auscultation. Cardiovascular system: S1 & S2, systolic murmur, query ejection systolic murmur.  Gastrointestinal system: Abdomen is soft nontender. Extremities: No pedal edema.   Data Reviewed:    Labs: Basic Metabolic Panel: Recent Labs  Lab 02/14/24 0523 02/15/24 0548 02/17/24 0981 02/17/24 0805 02/18/24 0549 02/19/24 0538 02/20/24 0409  NA 142   < > 136 136 134* 133* 133*  K 5.1   < > 5.1 5.2* 4.4 5.5* 4.8  CL 99   < > 94* 93* 93* 90* 91*  CO2 29   < > 25 25 27 26 29   GLUCOSE 91   < > 105* 114* 93 89 90  BUN 50*   < > 75* 76* 49* 77* 47*  CREATININE 7.32*   < > 8.39* 8.47* 5.58* 7.26* 4.96*  CALCIUM 8.6*   < > 8.1* 8.3* 8.5* 8.5* 8.7*  MG 2.2  --   --   --   --   --   --   PHOS 7.1*  --   --  7.4*  --   --   --    < > = values in this interval not displayed.   GFR Estimated Creatinine Clearance: 10.9 mL/min (A) (by C-G formula based on SCr of 4.96 mg/dL (H)). Liver Function Tests: Recent Labs  Lab 02/14/24 0523 02/17/24 0805  ALBUMIN 3.1* 2.9*   No results for input(s): "LIPASE", "AMYLASE" in the last 168 hours. No results for input(s): "AMMONIA" in the last 168 hours. Coagulation profile Recent Labs  Lab 02/14/24 0523  INR 1.2   COVID-19 Labs  No results for input(s): "DDIMER", "FERRITIN", "LDH", "CRP" in the last 72 hours.  Lab Results  Component Value Date   SARSCOV2NAA NEGATIVE 08/19/2023   SARSCOV2NAA  NEGATIVE 08/10/2023    CBC: Recent Labs  Lab 02/17/24 0642 02/17/24 0740 02/18/24 0549 02/19/24 0538 02/20/24 0409  WBC 12.5* 12.3* 8.6 8.9 7.6  HGB 7.3* 7.7* 7.5* 7.4* 8.0*  HCT 22.0* 23.5* 22.2* 22.2* 24.4*  MCV 97.3 97.9 96.5 96.1 96.8  PLT 250 260 272 310 348   Cardiac Enzymes: No results for input(s): "CKTOTAL", "CKMB", "CKMBINDEX", "TROPONINI" in the last 168 hours. BNP (last 3 results) No results for input(s): "PROBNP" in the last 8760 hours. CBG: No results for input(s): "GLUCAP" in the last 168 hours. D-Dimer: No results for input(s): "DDIMER" in the last 72 hours. Hgb A1c: No results for input(s): "HGBA1C" in  the last 72 hours. Lipid Profile: No results for input(s): "CHOL", "HDL", "LDLCALC", "TRIG", "CHOLHDL", "LDLDIRECT" in the last 72 hours. Thyroid function studies: No results for input(s): "TSH", "T4TOTAL", "T3FREE", "THYROIDAB" in the last 72 hours.  Invalid input(s): "FREET3" Anemia work up: No results for input(s): "VITAMINB12", "FOLATE", "FERRITIN", "TIBC", "IRON", "RETICCTPCT" in the last 72 hours. Sepsis Labs: Recent Labs  Lab 02/17/24 0740 02/18/24 0549 02/19/24 0538 02/20/24 0409  WBC 12.3* 8.6 8.9 7.6   Microbiology Recent Results (from the past 240 hours)  Surgical pcr screen     Status: None   Collection Time: 02/14/24  1:19 AM   Specimen: Nasal Mucosa; Nasal Swab  Result Value Ref Range Status   MRSA, PCR NEGATIVE NEGATIVE Final   Staphylococcus aureus NEGATIVE NEGATIVE Final    Comment: (NOTE) The Xpert SA Assay (FDA approved for NASAL specimens in patients 51 years of age and older), is one component of a comprehensive surveillance program. It is not intended to diagnose infection nor to guide or monitor treatment. Performed at [facility], [address], [place], [place] [address]      Medications:    (feeding supplement) PROSource Plus  30 mL Oral BID BM   amoxicillin-clavulanate  1 tablet Oral QHS    arformoterol  15 mcg Nebulization BID   aspirin  325 mg Oral Daily   budesonide  0.5 mg Nebulization BID   darbepoetin (ARANESP) injection - DIALYSIS  60 mcg Subcutaneous Q Wed-1800   metoprolol tartrate  12.5 mg Oral BID   olopatadine  1 drop Both Eyes BID   polyethylene glycol  17 g Oral BID   revefenacin  175 mcg Nebulization Daily   senna-docusate  1 tablet Oral BID   sevelamer carbonate  2.4 g Oral TID WC   Continuous Infusions:      LOS: 7 days    Time spent: 35 minutes.  Barnetta Chapel, MD  [facility]  02/20/2024, 12:38 PM

## 2024-02-20 NOTE — Progress Notes (Signed)
 Speech Language Pathology Treatment: Dysphagia  Patient Details Name: Jeremy [name] MRN: [id number] DOB: 1941-09-26 Today's Date: 02/20/2024 Time: 1030-1045 SLP Time Calculation (min) (ACUTE ONLY): 15 min  Assessment / Plan / Recommendation Clinical Impression  Skilled therapy session focused on dysphagia goals and family education. Upon entrance, RN administering medications crushed in puree and in HTL. Patient required max verbal and visual A to utilize compensatory strategies (chin tuck with HTL via tsp). Patient with x1 throat clear, which may be indicative of aspiration. Wife reports patient with no difficulty consuming PO at mealtimes and has had full supervision to cue for compensatory strategies. At this time, recommend continuation of current diet (Dysphagia 2/HTL via tsp w/ chin tuck) and STRICT FULL supervision to cue for use of tsp and chin tuck. Patient should sit upright during meals, utilize small and slow bites/sips and chin tuck with liquids.   HPI HPI: Patient is an 83 year old male presenting with femoral fx after a mechanical fall. Patient with PMH of COPD, obstructive sleep apnea (on CPAP) end-stage renal disease on hemodialysis, HFpEF, hx of bladder cancer. Patient seen for MBS on 12/08/23 recommending D3/NTL. Patient with "silent aspiration (PAS 8) of nectar thick liquids during initial few sips and one instance of questionable silent aspiration with thin liquids (shoulder obscured view during at times). Subsequent sips of nectar thick liquids did not result in aspiration" Recent CXR notes "Question developing left base airspace opacity. Recommend repeat  PA and lateral view of the chest for further evaluation."      SLP Plan  Continue with current plan of care      Recommendations for follow up therapy are one component of a multi-disciplinary discharge planning process, led by the attending physician.  Recommendations may be updated based on patient status, additional  functional criteria and insurance authorization.    Recommendations  Diet recommendations: Dysphagia 2 (fine chop);Honey-thick liquid (with chin tuck) Liquids provided via: Teaspoon Medication Administration: Crushed with puree Supervision: Full supervision/cueing for compensatory strategies Compensations: Minimize environmental distractions;Slow rate;Small sips/bites;Clear throat intermittently;Chin tuck Postural Changes and/or Swallow Maneuvers: Seated upright 90 degrees                      Frequent or constant Supervision/Assistance Dysphagia, oropharyngeal phase (R13.12)     Continue with current plan of care     Floyed Masoud M.A., CCC-SLP 02/20/2024, 10:46 AM

## 2024-02-20 NOTE — Progress Notes (Signed)
   02/20/24 0025  BiPAP/CPAP/SIPAP  $ Non-Invasive Home Ventilator  Subsequent  BiPAP/CPAP/SIPAP Pt Type Adult  BiPAP/CPAP/SIPAP Resmed  Reason BIPAP/CPAP not in use Non-compliant  BiPAP/CPAP /SiPAP Vitals  Pulse Rate 81  Resp 18  SpO2 96 %  Bilateral Breath Sounds Clear  MEWS Score/Color  MEWS Score 0  MEWS Score Color [place] Si

## 2024-02-20 NOTE — Progress Notes (Signed)
 [facility] Progress Note   Subjective:  Seen in room with caregiver. ONgoing sundowning and up most of the night.  HD yesterday.   Objective Vitals:   02/19/24 2000 02/19/24 2114 02/20/24 0025 02/20/24 0803  BP:  (!) 157/75  (!) 167/72  Pulse:  80 81 78  Resp:  18 18 18   Temp:  98.3 F (36.8 C)  98.8 F (37.1 C)  TempSrc:  Oral  Oral  SpO2: 94% 96% 96% 97%  Weight:      Height:       Physical Exam General: Elderly man, NAD. [place] O2 in place Heart: RRR; 2/6 murmur Lungs: CTA anteriorly Abdomen: soft Extremities: no LE edema Dialysis Access: LUE AVF +t/b    Additional Objective Labs: Basic Metabolic Panel: Recent Labs  Lab 02/14/24 0523 02/15/24 0548 02/17/24 0805 02/18/24 0549 02/19/24 0538 02/20/24 0409  NA 142   < > 136 134* 133* 133*  K 5.1   < > 5.2* 4.4 5.5* 4.8  CL 99   < > 93* 93* 90* 91*  CO2 29   < > 25 27 26 29   GLUCOSE 91   < > 114* 93 89 90  BUN 50*   < > 76* 49* 77* 47*  CREATININE 7.32*   < > 8.47* 5.58* 7.26* 4.96*  CALCIUM 8.6*   < > 8.3* 8.5* 8.5* 8.7*  PHOS 7.1*  --  7.4*  --   --   --    < > = values in this interval not displayed.   Liver Function Tests: Recent Labs  Lab 02/14/24 0523 02/17/24 0805  ALBUMIN 3.1* 2.9*   CBC: Recent Labs  Lab 02/17/24 0642 02/17/24 0740 02/18/24 0549 02/19/24 0538 02/20/24 0409  WBC 12.5* 12.3* 8.6 8.9 7.6  HGB 7.3* 7.7* 7.5* 7.4* 8.0*  HCT 22.0* 23.5* 22.2* 22.2* 24.4*  MCV 97.3 97.9 96.5 96.1 96.8  PLT 250 260 272 310 348   Medications:   (feeding supplement) PROSource Plus  30 mL Oral BID BM   amoxicillin-clavulanate  1 tablet Oral QHS   arformoterol  15 mcg Nebulization BID   aspirin  325 mg Oral Daily   budesonide  0.5 mg Nebulization BID   darbepoetin (ARANESP) injection - DIALYSIS  60 mcg Subcutaneous Q Wed-1800   metoprolol tartrate  12.5 mg Oral BID   olopatadine  1 drop Both Eyes BID   polyethylene glycol  17 g Oral BID   revefenacin  175 mcg Nebulization  Daily   senna-docusate  1 tablet Oral BID   sevelamer carbonate  2.4 g Oral TID WC    Dialysis Orders TTS - AF 3hr, 400/600, EDW 68.5kg, 2K/2Ca, UFP #2, LUE AVF, heparin 2500 unit q HD - no ESA d/t bladder cancer - Getting course of IV iron - Hectoral IV q HD   Assessment/Plan: R femoral neck Fx: S/p 3 screws surgically placed 4/5. ESRD: Continue HD on TTS schedule - for HD 4/12. Pneumonia: Per CXR, L base infiltrate. Productive cough. On Augmentin. Better today. HTN/volume: BP high, no edema on exam - above prior EDW. 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  Hx bladder cancer- > but s/p TURBT 12/2021. No contraindication to ESA at this time - after discussion with family, was resumed, Aranesp given 4/9. Secondary HPTH: Ca ok, Phos a little high - continue binders. Resume VDRA as outpatient. Nutrition: Alb low, adding protein supplements. COPD: Uses O2 at baseline. Dispo: SNF placement pending. Delirium /  sundownign: d/w caregiver, per [facility]  Arita Miss, MD  02/20/2024, 9:28 AM  [facility]

## 2024-02-20 NOTE — Progress Notes (Signed)
 Physical Therapy Treatment Patient Details Name: Jeremy [name] MRN: [id number] DOB: 1941-06-17 Today's Date: 02/20/2024   History of Present Illness Patient is 83 y.o. male who presented to the ED on 02/14/24 after a mechanical fall from tripping on a rug. Work up revealed right femoral fracture, pt now s/p Rt LE ORIF 02/14/24.     PMH includes chronic respiratory failure, ESRD, tobacco abuse, OSA, HTN, HLD.    PT Comments  Pt admitted with above diagnosis. Pt was able to perform bed mobility with min assist and cues. Pt transfers with mod assist of 2 and progressed ambulation up to 15 feet x 2 with min assist +2.  Pt continues to need incr cues and assist for ambulation due to poor posture and poor endurance requiring 5-6LO2 to keep sats > 90%.  Continue to follow acutely.  Pt currently with functional limitations due to the deficits listed below (see PT Problem List). Pt will benefit from acute skilled PT to increase their independence and safety with mobility to allow discharge.       If plan is discharge home, recommend the following: Two people to help with walking and/or transfers;Two people to help with bathing/dressing/bathroom;Assist for transportation;Help with stairs or ramp for entrance;Supervision due to cognitive status;Direct supervision/assist for medications management;Assistance with cooking/housework   Can travel by private vehicle     No  Equipment Recommendations  BSC/3in1    Recommendations for Other Services OT consult     Precautions / Restrictions Precautions Precautions: Fall Recall of Precautions/Restrictions: Impaired Restrictions Weight Bearing Restrictions Per Provider Order: Yes RLE Weight Bearing Per Provider Order: Weight bearing as tolerated     Mobility  Bed Mobility Overal bed mobility: Modified Independent, Needs Assistance Bed Mobility: Rolling, Sidelying to Sit Rolling: Used rails, Supervision Sidelying to sit: Min assist, HOB elevated        General bed mobility comments: Pt rolled wtih use of bed rails and HOB elevated without cues.  min assist for trunk elevation and cues to push up on elbow. Took incr time to scoot to EOB.    Transfers Overall transfer level: Needs assistance Equipment used: Rolling walker (2 wheels) Transfers: Sit to/from Stand Sit to Stand: Mod assist, From elevated surface, +2 safety/equipment, +2 physical assistance           General transfer comment: Pt stood up from EOB to RW with mod assist +2 for physical help and equipment/safety. Pt needed cues for hand placement. Pt performed another sit to stand prior to ambulation as he had to rest x1 in the hallway due to fatigue. Pt needed more cues for hand placement on recliner.    Ambulation/Gait Ambulation/Gait assistance: Min assist, +2 safety/equipment Gait Distance (Feet): 15 Feet (15 feet, rest and then 15 feet) Assistive device: Rolling walker (2 wheels) Gait Pattern/deviations: Step-to pattern, Decreased step length - right, Decreased step length - left, Narrow base of support, Trunk flexed   Gait velocity interpretation: <1.31 ft/sec, indicative of household ambulator   General Gait Details: Pt walked in hallway with min +2 assistance for physical support, equipment, and chair follow. Pt needed cues to increase step length.Pt leaning left to unweight right LE and needed verbal cues and tactile cues for correcting this during gait.  As pt fatigues, posture worsens. Pt on 5LO2 on arrival with sats 94%.  Pt on 6LO2 (portable tank doesnt have 5L) with O2 97%.   Stairs             Psychologist, prison and probation services  Tilt Bed    Modified Rankin (Stroke Patients Only)       Balance Overall balance assessment: Needs assistance, History of Falls Sitting-balance support: Feet supported, No upper extremity supported Sitting balance-Leahy Scale: Fair     Standing balance support: Bilateral upper extremity supported, During functional activity,  Reliant on assistive device for balance Standing balance-Leahy Scale: Poor Standing balance comment: reliant on RW, initial posterior lean upon standing, need for +2 min assist                            Communication Communication Communication: Impaired Factors Affecting Communication: Hearing impaired  Cognition Arousal: Alert Behavior During Therapy: WFL for tasks assessed/performed   PT - Cognitive impairments: Sequencing, Problem solving, Safety/Judgement                       PT - Cognition Comments: Difficulty with sequencing motor tasks and remembering prior cues for hand placement. Following commands: Impaired Following commands impaired: Follows one step commands with increased time, Only follows one step commands consistently    Cueing Cueing Techniques: Verbal cues, Gestural cues, Tactile cues  Exercises General Exercises - Lower Extremity Ankle Circles/Pumps: Both, 10 reps, Supine, AROM Long Arc Quad: AROM, Both, 10 reps, Seated Heel Slides: AAROM, 5 reps, Supine, Both Hip ABduction/ADduction: AROM, Both, Supine, 5 reps Straight Leg Raises: 5 reps, AROM, Both, Supine    General Comments General comments (skin integrity, edema, etc.): Caregivers present during session. HEP issued to pt.      Pertinent Vitals/Pain Pain Assessment Pain Assessment: No/denies pain    Home Living                          Prior Function            PT Goals (current goals can now be found in the care plan section) Progress towards PT goals: Progressing toward goals    Frequency    Min 2X/week      PT Plan      Co-evaluation              AM-PAC PT "6 Clicks" Mobility   Outcome Measure  Help needed turning from your back to your side while in a flat bed without using bedrails?: A Lot Help needed moving from lying on your back to sitting on the side of a flat bed without using bedrails?: A Lot Help needed moving to and from a bed to  a chair (including a wheelchair)?: A Lot Help needed standing up from a chair using your arms (e.g., wheelchair or bedside chair)?: Total Help needed to walk in hospital room?: Total Help needed climbing 3-5 steps with a railing? : Total 6 Click Score: 9    End of Session Equipment Utilized During Treatment: Gait belt;Oxygen Activity Tolerance: Patient tolerated treatment well;Patient limited by fatigue Patient left: with call bell/phone within reach;with family/visitor present;in chair;with chair alarm set Nurse Communication: Mobility status PT Visit Diagnosis: Other abnormalities of gait and mobility (R26.89);Difficulty in walking, not elsewhere classified (R26.2);Other symptoms and signs involving the nervous system (R29.898);Unsteadiness on feet (R26.81);History of falling (Z91.81) Pain - Right/Left: Right Pain - part of body: Hip;Leg     Time: 1027-2536 PT Time Calculation (min) (ACUTE ONLY): 26 min  Charges:    $Gait Training: 8-22 mins $Therapeutic Exercise: 8-22 mins PT General Charges $$ ACUTE PT VISIT: 1 Visit  [facility] M,PT Acute Rehab Services [phone]    Bevelyn Buckles 02/20/2024, 11:14 AM

## 2024-02-20 NOTE — Progress Notes (Signed)
 02/20/24 1118  PT Visit Information  Last PT Received On 02/20/24  Assistance Needed +2  History of Present Illness Patient is 83 y.o. male who presented to the ED on 02/14/24 after a mechanical fall from tripping on a rug. Work up revealed right femoral fracture, pt now s/p Rt LE ORIF 02/14/24.     PMH includes chronic respiratory failure, ESRD, tobacco abuse, OSA, HTN, HLD.  Precautions  Precautions Fall  Recall of Precautions/Restrictions Impaired  Restrictions  Weight Bearing Restrictions Per Provider Order Yes  RLE Weight Bearing Per Provider Order WBAT  Pain Assessment  Pain Assessment No/denies pain  Cognition  Arousal Alert  Behavior During Therapy [facility] for tasks assessed/performed  PT - Cognitive impairments Sequencing;Problem solving;Safety/Judgement  PT - Cognition Comments Difficulty with sequencing motor tasks and remembering prior cues for hand placement.  Following Commands  Following commands Impaired  Following commands impaired Follows one step commands with increased time;Only follows one step commands consistently  Cueing  Cueing Techniques Verbal cues;Gestural cues;Tactile cues  Communication  Communication Impaired  Factors Affecting Communication Hearing impaired  Bed Mobility  General bed mobility comments NT asked PT to assist pt onto 3N1 as she wanted to have +2 assist.  Transfers  Overall transfer level Needs assistance  Equipment used Rolling walker (2 wheels)  Transfers Sit to/from Stand  Sit to Stand Mod assist;+2 safety/equipment;+2 physical assistance  Stand pivot transfers Mod assist;From elevated surface;+2 safety/equipment  General transfer comment Pt stood up from recliner to RW with mod assist +2 for physical help and equipment/safety. Pt needed cues for hand placement. Pt transferred to 3N1 with cues and assist. Pt very fatigued and on return to recliner was removing hand from RW prior to reaching relciner and didnt reach back as instructed  needing incr assist.  Pt needed more cues for hand placement on recliner.  Balance  Standing balance support Bilateral upper extremity supported;During functional activity;Reliant on assistive device for balance  Standing balance-Leahy Scale Poor  Standing balance comment reliant on RW, initial posterior lean upon standing, need for +2 mod assist  PT - End of Session  Equipment Utilized During Treatment Gait belt;Oxygen  Activity Tolerance Patient limited by fatigue  Patient left with call bell/phone within reach;with family/visitor present;in chair;with chair alarm set  Nurse Communication Mobility status;Need for lift equipment Antony Salmon)   PT - Assessment/Plan  PT Visit Diagnosis Other abnormalities of gait and mobility (R26.89);Difficulty in walking, not elsewhere classified (R26.2);Other symptoms and signs involving the nervous system (R29.898);Unsteadiness on feet (R26.81);History of falling (Z91.81)  Pain - Right/Left Right  Pain - part of body Hip;Leg  PT Frequency (ACUTE ONLY) Min 2X/week  Recommendations for Other Services OT consult  Follow Up Recommendations Skilled nursing-short term rehab (<3 hours/day)  Can patient physically be transported by private vehicle No  Patient can return home with the following Two people to help with walking and/or transfers;Two people to help with bathing/dressing/bathroom;Assist for transportation;Help with stairs or ramp for entrance;Supervision due to cognitive status;Direct supervision/assist for medications management;Assistance with cooking/housework  PT equipment BSC/3in1  AM-PAC PT "6 Clicks" Mobility Outcome Measure (Version 2)  Help needed turning from your back to your side while in a flat bed without using bedrails? 2  Help needed moving from lying on your back to sitting on the side of a flat bed without using bedrails? 2  Help needed moving to and from a bed to a chair (including a wheelchair)? 2  Help needed standing up from a  chair  using your arms (e.g., wheelchair or bedside chair)? 1  Help needed to walk in hospital room? 1  Help needed climbing 3-5 steps with a railing?  1  6 Click Score 9  Consider Recommendation of Discharge To: CIR/SNF/LTACH  Progressive Mobility  What is the highest level of mobility based on the progressive mobility assessment? Level 3 (Stands with assist) - Balance while standing  and cannot march in place  Mobility Referral Yes  Activity [facility]red [facility]  PT Goal Progression  Progress towards PT goals Progressing toward goals  PT Time Calculation  PT Start Time (ACUTE ONLY) 1007  PT Stop Time (ACUTE ONLY) 1022  PT Time Calculation (min) (ACUTE ONLY) 15 min  PT General Charges  $$ ACUTE PT VISIT 1 Visit  PT Treatments  $Therapeutic Activity 8-22 mins   After PT had finished treatment, family member asked PT if pt could get onto 3N1.  NT was called but didn't feel comfortable getting pt on 3N1 on her own therefore PT assisted NT with transfers.  Will continue PT.  Waunita Sandstrom M,PT Acute Rehab Services [phone]

## 2024-02-20 NOTE — TOC Progression Note (Signed)
 Transition of Care [facility]) - Progression Note    Patient Details  Name: Jeremy [name] MRN: [id number] Date of Birth: February 17, 1941  [facility] CM/SW Contact  Lorri Frederick, LCSW Phone Number: 02/20/2024, 12:39 PM  Clinical Narrative:   Per MD, pt not stable for DC.  CSW spoke with [facility]: they cannot receive pt over the weekend.      Expected Discharge Plan: Skilled Nursing Facility Barriers to Discharge: Continued Medical Work up, SNF Pending bed offer  Expected Discharge Plan and Services In-house Referral: Clinical Social Work   Post Acute Care Choice: Skilled Nursing Facility Living arrangements for the past 2 months: Single Family Home                                       Social Determinants of Health (SDOH) Interventions SDOH Screenings   Food Insecurity: No Food Insecurity (02/13/2024)  Housing: Low Risk  (02/13/2024)  Transportation Needs: No Transportation Needs (02/13/2024)  Utilities: Not At Risk (02/14/2024)  Depression (PHQ2-9): Medium Risk (01/19/2024)  Social Connections: Socially Integrated (02/14/2024)  Tobacco Use: Medium Risk (02/14/2024)    Readmission Risk Interventions     No data to display

## 2024-02-21 ENCOUNTER — Inpatient Hospital Stay (HOSPITAL_COMMUNITY)

## 2024-02-21 DIAGNOSIS — S72001D Fracture of unspecified part of neck of right femur, subsequent encounter for closed fracture with routine healing: Secondary | ICD-10-CM | POA: Diagnosis not present

## 2024-02-21 LAB — CBC
HCT: 23.1 % — ABNORMAL LOW (ref 39.0–52.0)
Hemoglobin: 8 g/dL — ABNORMAL LOW (ref 13.0–17.0)
MCH: 33.2 pg (ref 26.0–34.0)
MCHC: 34.6 g/dL (ref 30.0–36.0)
MCV: 95.9 fL (ref 80.0–100.0)
Platelets: 370 10*3/uL (ref 150–400)
RBC: 2.41 MIL/uL — ABNORMAL LOW (ref 4.22–5.81)
RDW: 12.6 % (ref 11.5–15.5)
WBC: 8.5 10*3/uL (ref 4.0–10.5)
nRBC: 0 % (ref 0.0–0.2)

## 2024-02-21 LAB — BASIC METABOLIC PANEL WITH GFR
Anion gap: 17 — ABNORMAL HIGH (ref 5–15)
BUN: 77 mg/dL — ABNORMAL HIGH (ref 8–23)
CO2: 25 mmol/L (ref 22–32)
Calcium: 8.5 mg/dL — ABNORMAL LOW (ref 8.9–10.3)
Chloride: 92 mmol/L — ABNORMAL LOW (ref 98–111)
Creatinine, Ser: 6.62 mg/dL — ABNORMAL HIGH (ref 0.61–1.24)
GFR, Estimated: 8 mL/min — ABNORMAL LOW (ref >60)
Glucose, Bld: 95 mg/dL (ref 70–99)
Potassium: 5.2 mmol/L — ABNORMAL HIGH (ref 3.5–5.1)
Sodium: 134 mmol/L — ABNORMAL LOW (ref 135–145)

## 2024-02-21 NOTE — Progress Notes (Signed)
 [facility] PROGRESS NOTE    Progress Note  Jeremy [name]  ZOX:[id number] DOB: [birth year] DOA: 02/13/2024 PCP: Adela Holter, DO     Brief Narrative:  As per prior documentation: "Jeremy [name] is an 83 y.o. male past medical history of COPD on 3 L of oxygen, obstructive sleep apnea on CPAP, end-stage renal disease on hemodialysis, HFpEF, history of bladder cancer into the ED after mechanical fall after getting entangled with a rug, imaging showed femoral fracture"  02/20/2024: Patient seen alongside patient's wife.  Patient continues to improve.  Likely discharge tomorrow after hemodialysis if possible.  Complete course of oral antibiotics.  02/21/2024: Patient seen alongside patient's nurse.  Patient underwent hemodialysis today with 2 L ultrafiltration.  Vague report of left shoulder pain, will proceed with x-ray of the left shoulder.  No area of point tenderness involving the left shoulder.  Likely discharge to skilled nursing facility on Monday, 02/23/2024.  Assessment/Plan:   Right femoral fracture: Status post ORIF on 02/14/2024. Continue narcotics, aspirin for DVT prophylaxis per Ortho. PT OT evaluated the patient, will skilled nursing facility. 02/21/2024: Likely discharge on 02/23/2024.  Acute metabolic encephalopathy in the setting of  aspiration pneumonia: Chest x-ray showed right lower lobe opacity concerning for pneumonia. Continue oral Augmentin Currently on a dysphagia diet with nectar thick. Speech consulted for MBS 02/21/2024: Acute acute encephalopathy has resolved.  Patient will complete course of antibiotics.  Mild acute on chronic respiratory failure on 3 L of oxygen: Not in exacerbation. He was continued on home inhalers. Now requiring 5 L oxygen to keep saturations greater 92%. Likely due to aspiration episode. Wife would like to avoid melatonin and Seroquel.  Reinforced use of CPAP. 02/21/2024: Titrate oxygen requirement as needed.  Anemia of chronic renal  disease/end-stage renal disease: Hemoglobin is 8 g/dL today.  Chronic HFpEF: Fluid management per dialysis. Continue metoprolol. 02/21/2024: Patient underwent hemodialysis with 2 L ultrafiltration.  Hyperkalemia: -Potassium of 5.2 today. -Patient underwent hemodialysis today.  End-stage renal disease on hemodialysis Tuesday Thursdays and Saturdays: Continue further management per renal. 02/21/2024: Underwent hemodialysis today with 2 L ultrafiltration.  Sleep apnea: Continue CPAP.  DVT prophylaxis: lovenox Family Communication: Wife Status is: Inpatient Remains inpatient appropriate because: Right hip fracture    Code Status:     Code Status Orders  (From admission, onward)           Start     Ordered   02/13/24 2114  Full code  Continuous       Question:  By:  Answer:  Consent: discussion documented in EHR   02/13/24 2113           Code Status History     Date Active Date Inactive Code Status Order ID Comments User Context   08/10/2023 1802 08/19/2023 1853 Full Code [id number]  Lemmie Pyo, NP ED   08/10/2023 1715 08/10/2023 1802 Full Code [id number]  Ninetta Basket, MD ED   09/12/2022 1936 09/17/2022 2213 Full Code [id number]  Elgergawy, Ardia Kraft, MD Inpatient   03/03/2018 0152 03/04/2018 1827 Full Code [id number]  Fidencio Hue, MD Inpatient   08/25/2017 1622 08/26/2017 1133 Full Code [id number]  Elna Haggis, MD Inpatient   07/29/2013 1742 07/30/2013 1854 Full Code [id number]  Danette Duos, MD Inpatient         IV Access:   Peripheral IV   Procedures and diagnostic studies:   No results found.     Medical Consultants:   Nephrology. Orthopedic team.  Subjective:   -Patient has no new complaints. -Left shoulder pain as per collateral information.   Objective:    Vitals:   02/21/24 1103 02/21/24 1130 02/21/24 1150 02/21/24 1239  BP: (!) 116/57 (!) 121/54 (!) 114/57 112/60  Pulse: (!) 101 99 92 85  Resp: (!) 28 (!) 22 15 15   Temp:    97.8 F (36.6 C)   TempSrc:      SpO2: 96% 100% 98% 92%  Weight:   66.9 kg   Height:       SpO2: 92 % O2 Flow Rate (L/min): 4 L/min FiO2 (%): (!) 4 %   Intake/Output Summary (Last 24 hours) at 02/21/2024 1542 Last data filed at 02/21/2024 1150 Gross per 24 hour  Intake --  Output 2235 ml  Net -2235 ml   Filed Weights   02/19/24 1707 02/21/24 0743 02/21/24 1150  Weight: 68.6 kg 69.1 kg 66.9 kg    Exam: General exam: Awake and alert. Respiratory system: Clear to auscultation. Cardiovascular system: S1 & S2, systolic murmur, query ejection systolic murmur.  Gastrointestinal system: Abdomen is soft nontender. Extremities: No pedal edema.  No left shoulder tenderness.   Data Reviewed:    Labs: Basic Metabolic Panel: Recent Labs  Lab 02/17/24 0805 02/18/24 0549 02/19/24 0538 02/20/24 0409 02/21/24 0329  NA 136 134* 133* 133* 134*  K 5.2* 4.4 5.5* 4.8 5.2*  CL 93* 93* 90* 91* 92*  CO2 25 27 26 29 25   GLUCOSE 114* 93 89 90 95  BUN 76* 49* 77* 47* 77*  CREATININE 8.47* 5.58* 7.26* 4.96* 6.62*  CALCIUM 8.3* 8.5* 8.5* 8.7* 8.5*  PHOS 7.4*  --   --   --   --    GFR Estimated Creatinine Clearance: 8 mL/min (A) (by C-G formula based on SCr of 6.62 mg/dL (H)). Liver Function Tests: Recent Labs  Lab 02/17/24 0805  ALBUMIN 2.9*   No results for input(s): "LIPASE", "AMYLASE" in the last 168 hours. No results for input(s): "AMMONIA" in the last 168 hours. Coagulation profile No results for input(s): "INR", "PROTIME" in the last 168 hours.  COVID-19 Labs  No results for input(s): "DDIMER", "FERRITIN", "LDH", "CRP" in the last 72 hours.  Lab Results  Component Value Date   SARSCOV2NAA NEGATIVE 08/19/2023   SARSCOV2NAA NEGATIVE 08/10/2023    CBC: Recent Labs  Lab 02/17/24 0740 02/18/24 0549 02/19/24 0538 02/20/24 0409 02/21/24 0329  WBC 12.3* 8.6 8.9 7.6 8.5  HGB 7.7* 7.5* 7.4* 8.0* 8.0*  HCT 23.5* 22.2* 22.2* 24.4* 23.1*  MCV 97.9 96.5 96.1 96.8 95.9   PLT 260 272 310 348 370   Cardiac Enzymes: No results for input(s): "CKTOTAL", "CKMB", "CKMBINDEX", "TROPONINI" in the last 168 hours. BNP (last 3 results) No results for input(s): "PROBNP" in the last 8760 hours. CBG: No results for input(s): "GLUCAP" in the last 168 hours. D-Dimer: No results for input(s): "DDIMER" in the last 72 hours. Hgb A1c: No results for input(s): "HGBA1C" in the last 72 hours. Lipid Profile: No results for input(s): "CHOL", "HDL", "LDLCALC", "TRIG", "CHOLHDL", "LDLDIRECT" in the last 72 hours. Thyroid function studies: No results for input(s): "TSH", "T4TOTAL", "T3FREE", "THYROIDAB" in the last 72 hours.  Invalid input(s): "FREET3" Anemia work up: No results for input(s): "VITAMINB12", "FOLATE", "FERRITIN", "TIBC", "IRON", "RETICCTPCT" in the last 72 hours. Sepsis Labs: Recent Labs  Lab 02/18/24 0549 02/19/24 0538 02/20/24 0409 02/21/24 0329  WBC 8.6 8.9 7.6 8.5   Microbiology Recent Results (from the past 240 hours)  Surgical pcr screen     Status: None   Collection Time: 02/14/24  1:19 AM   Specimen: Nasal Mucosa; Nasal Swab  Result Value Ref Range Status   MRSA, PCR NEGATIVE NEGATIVE Final   Staphylococcus aureus NEGATIVE NEGATIVE Final    Comment: (NOTE) The Xpert SA Assay (FDA approved for NASAL specimens in patients 28 years of age and older), is one component of a comprehensive surveillance program. It is not intended to diagnose infection nor to guide or monitor treatment. Performed at [facility], [address] [place], [place] [address]      Medications:    (feeding supplement) PROSource Plus  30 mL Oral BID BM   amoxicillin-clavulanate  1 tablet Oral QHS   arformoterol  15 mcg Nebulization BID   aspirin  325 mg Oral Daily   budesonide  0.5 mg Nebulization BID   darbepoetin (ARANESP) injection - DIALYSIS  60 mcg Subcutaneous Q Wed-1800   metoprolol tartrate  12.5 mg Oral BID   olopatadine  1 drop Both Eyes BID    polyethylene glycol  17 g Oral BID   revefenacin  175 mcg Nebulization Daily   senna-docusate  1 tablet Oral BID   sevelamer carbonate  2.4 g Oral TID WC   Continuous Infusions:      LOS: 8 days    Time spent: 35 minutes.  Doroteo Gasmen, MD  [facility]  02/21/2024, 3:42 PM

## 2024-02-21 NOTE — Procedures (Signed)
 I was present at this dialysis session. I have reviewed the session and made appropriate changes.   2K bath.  UF 2L.  Awake, alert, no complaints.    Filed Weights   02/19/24 1350 02/19/24 1707 02/21/24 0743  Weight: 70.1 kg 68.6 kg 69.1 kg    Recent Labs  Lab 02/17/24 0805 02/18/24 0549 02/21/24 0329  NA 136   < > 134*  K 5.2*   < > 5.2*  CL 93*   < > 92*  CO2 25   < > 25  GLUCOSE 114*   < > 95  BUN 76*   < > 77*  CREATININE 8.47*   < > 6.62*  CALCIUM 8.3*   < > 8.5*  PHOS 7.4*  --   --    < > = values in this interval not displayed.    Recent Labs  Lab 02/19/24 0538 02/20/24 0409 02/21/24 0329  WBC 8.9 7.6 8.5  HGB 7.4* 8.0* 8.0*  HCT 22.2* 24.4* 23.1*  MCV 96.1 96.8 95.9  PLT 310 348 370    Scheduled Meds:  (feeding supplement) PROSource Plus  30 mL Oral BID BM   amoxicillin-clavulanate  1 tablet Oral QHS   arformoterol  15 mcg Nebulization BID   aspirin  325 mg Oral Daily   budesonide  0.5 mg Nebulization BID   darbepoetin (ARANESP) injection - DIALYSIS  60 mcg Subcutaneous Q Wed-1800   metoprolol tartrate  12.5 mg Oral BID   olopatadine  1 drop Both Eyes BID   polyethylene glycol  17 g Oral BID   revefenacin  175 mcg Nebulization Daily   senna-docusate  1 tablet Oral BID   sevelamer carbonate  2.4 g Oral TID WC   Continuous Infusions: PRN Meds:.acetaminophen, antiseptic oral rinse, prochlorperazine   Fawn Hooks  MD 02/21/2024, 10:34 AM

## 2024-02-22 DIAGNOSIS — S72001D Fracture of unspecified part of neck of right femur, subsequent encounter for closed fracture with routine healing: Secondary | ICD-10-CM | POA: Diagnosis not present

## 2024-02-22 LAB — BASIC METABOLIC PANEL WITH GFR
Anion gap: 14 (ref 5–15)
BUN: 57 mg/dL — ABNORMAL HIGH (ref 8–23)
CO2: 27 mmol/L (ref 22–32)
Calcium: 9 mg/dL (ref 8.9–10.3)
Chloride: 90 mmol/L — ABNORMAL LOW (ref 98–111)
Creatinine, Ser: 5.11 mg/dL — ABNORMAL HIGH (ref 0.61–1.24)
GFR, Estimated: 11 mL/min — ABNORMAL LOW (ref >60)
Glucose, Bld: 130 mg/dL — ABNORMAL HIGH (ref 70–99)
Potassium: 5.3 mmol/L — ABNORMAL HIGH (ref 3.5–5.1)
Sodium: 131 mmol/L — ABNORMAL LOW (ref 135–145)

## 2024-02-22 LAB — CBC
HCT: 23.5 % — ABNORMAL LOW (ref 39.0–52.0)
Hemoglobin: 7.8 g/dL — ABNORMAL LOW (ref 13.0–17.0)
MCH: 32 pg (ref 26.0–34.0)
MCHC: 33.2 g/dL (ref 30.0–36.0)
MCV: 96.3 fL (ref 80.0–100.0)
Platelets: 452 10*3/uL — ABNORMAL HIGH (ref 150–400)
RBC: 2.44 MIL/uL — ABNORMAL LOW (ref 4.22–5.81)
RDW: 13 % (ref 11.5–15.5)
WBC: 9.4 10*3/uL (ref 4.0–10.5)
nRBC: 0 % (ref 0.0–0.2)

## 2024-02-22 NOTE — Progress Notes (Signed)
 [facility] PROGRESS NOTE    Progress Note  Jeremy [name]  ZDG:[id number] DOB: 05-Jul-1941 DOA: 02/13/2024 PCP: Adela Holter, DO     Brief Narrative:  As per prior documentation: "Jeremy [name] is an 83 y.o. male past medical history of COPD on 3 L of oxygen, obstructive sleep apnea on CPAP, end-stage renal disease on hemodialysis, HFpEF, history of bladder cancer into the ED after mechanical fall after getting entangled with a rug, imaging showed femoral fracture"  02/20/2024: Patient seen alongside patient's wife.  Patient continues to improve.  Likely discharge tomorrow after hemodialysis if possible.  Complete course of oral antibiotics.  02/21/2024: Patient seen alongside patient's nurse.  Patient underwent hemodialysis today with 2 L ultrafiltration.  Vague report of left shoulder pain, will proceed with x-ray of the left shoulder.  No area of point tenderness involving the left shoulder.  Likely discharge to skilled nursing facility on Monday, 02/23/2024.  02/22/2024, patient seen alongside patient's wife.  No new changes.  Likely discharge tomorrow.  Assessment/Plan:   Right femoral fracture: Status post ORIF on 02/14/2024. Continue narcotics, aspirin for DVT prophylaxis per Ortho. PT OT evaluated the patient, will skilled nursing facility. 02/21/2024: Likely discharge on 02/23/2024.  Acute metabolic encephalopathy in the setting of  aspiration pneumonia: Chest x-ray showed right lower lobe opacity concerning for pneumonia. Continue oral Augmentin Currently on a dysphagia diet with nectar thick. Speech consulted for MBS 02/21/2024: Acute acute encephalopathy has resolved.  Patient will complete course of antibiotics.  Mild acute on chronic respiratory failure on 3 L of oxygen: Not in exacerbation. He was continued on home inhalers. Now requiring 5 L oxygen to keep saturations greater 92%. Likely due to aspiration episode. Wife would like to avoid melatonin and Seroquel.   Reinforced use of CPAP. 02/21/2024: Titrate oxygen requirement as needed.  Anemia of chronic renal disease/end-stage renal disease: Hemoglobin is 8 g/dL today.  Chronic HFpEF: Fluid management per dialysis. Continue metoprolol. 02/21/2024: Patient underwent hemodialysis with 2 L ultrafiltration.  Hyperkalemia: -Potassium of 5.2 today. -Patient underwent hemodialysis today.  End-stage renal disease on hemodialysis Tuesday Thursdays and Saturdays: Continue further management per renal. 02/21/2024: Underwent hemodialysis today with 2 L ultrafiltration.  Sleep apnea: Continue CPAP.  DVT prophylaxis: lovenox Family Communication: Wife Status is: Inpatient Remains inpatient appropriate because: Right hip fracture    Code Status:     Code Status Orders  (From admission, onward)           Start     Ordered   02/13/24 2114  Full code  Continuous       Question:  By:  Answer:  Consent: discussion documented in EHR   02/13/24 2113           Code Status History     Date Active Date Inactive Code Status Order ID Comments User Context   08/10/2023 1802 08/19/2023 1853 Full Code [id number]  Lemmie Pyo, NP ED   08/10/2023 1715 08/10/2023 1802 Full Code [id number]  Ninetta Basket, MD ED   09/12/2022 1936 09/17/2022 2213 Full Code [id number]  Elgergawy, Ardia Kraft, MD Inpatient   03/03/2018 0152 03/04/2018 1827 Full Code [id number]  Fidencio Hue, MD Inpatient   08/25/2017 1622 08/26/2017 1133 Full Code [id number]  Elna Haggis, MD Inpatient   07/29/2013 1742 07/30/2013 1854 Full Code [id number]  Danette Duos, MD Inpatient         IV Access:   Peripheral IV   Procedures and diagnostic studies:   DG  Shoulder Left Result Date: 02/21/2024 CLINICAL DATA:  Left shoulder pain EXAM: LEFT SHOULDER - 2+ VIEW COMPARISON:  February 13 2024 FINDINGS: Osteopenia. No acute fracture or dislocation. There is no evidence of arthropathy or other focal bone abnormality. Soft tissues are  unremarkable. Aortic and peripheral vascular atherosclerosis. IMPRESSION: Osteopenia.  No acute fracture or dislocation. Electronically Signed   By: Rance Burrows M.D.   On: 02/21/2024 16:16       Medical Consultants:   Nephrology. Orthopedic team.   Subjective:   -Patient has no new complaints. -Left shoulder pain as per collateral information.   Objective:    Vitals:   02/22/24 0156 02/22/24 0438 02/22/24 0856 02/22/24 1715  BP:  (!) 153/50 (!) 153/66 (!) 151/46  Pulse:  80 76 79  Resp:  (!) 21 (!) 21 20  Temp:  98.2 F (36.8 C)  97.8 F (36.6 C)  TempSrc:  Oral  Oral  SpO2: 95% 91% 99% 94%  Weight:      Height:       SpO2: 94 % O2 Flow Rate (L/min): 4 L/min FiO2 (%): (!) 4 %   Intake/Output Summary (Last 24 hours) at 02/22/2024 1849 Last data filed at 02/21/2024 1940 Gross per 24 hour  Intake 80 ml  Output 0 ml  Net 80 ml   Filed Weights   02/19/24 1707 02/21/24 0743 02/21/24 1150  Weight: 68.6 kg 69.1 kg 66.9 kg    Exam: General exam: Awake and alert. Respiratory system: Clear to auscultation. Cardiovascular system: S1 & S2, systolic murmur, query ejection systolic murmur.  Gastrointestinal system: Abdomen is soft nontender. Extremities: No pedal edema.  No left shoulder tenderness.   Data Reviewed:    Labs: Basic Metabolic Panel: Recent Labs  Lab 02/17/24 0805 02/18/24 0549 02/19/24 0538 02/20/24 0409 02/21/24 0329 02/22/24 0924  NA 136 134* 133* 133* 134* 131*  K 5.2* 4.4 5.5* 4.8 5.2* 5.3*  CL 93* 93* 90* 91* 92* 90*  CO2 25 27 26 29 25 27   GLUCOSE 114* 93 89 90 95 130*  BUN 76* 49* 77* 47* 77* 57*  CREATININE 8.47* 5.58* 7.26* 4.96* 6.62* 5.11*  CALCIUM 8.3* 8.5* 8.5* 8.7* 8.5* 9.0  PHOS 7.4*  --   --   --   --   --    GFR Estimated Creatinine Clearance: 10.4 mL/min (A) (by C-G formula based on SCr of 5.11 mg/dL (H)). Liver Function Tests: Recent Labs  Lab 02/17/24 0805  ALBUMIN 2.9*   No results for input(s): "LIPASE",  "AMYLASE" in the last 168 hours. No results for input(s): "AMMONIA" in the last 168 hours. Coagulation profile No results for input(s): "INR", "PROTIME" in the last 168 hours.  COVID-19 Labs  No results for input(s): "DDIMER", "FERRITIN", "LDH", "CRP" in the last 72 hours.  Lab Results  Component Value Date   SARSCOV2NAA NEGATIVE 08/19/2023   SARSCOV2NAA NEGATIVE 08/10/2023    CBC: Recent Labs  Lab 02/18/24 0549 02/19/24 0538 02/20/24 0409 02/21/24 0329 02/22/24 0924  WBC 8.6 8.9 7.6 8.5 9.4  HGB 7.5* 7.4* 8.0* 8.0* 7.8*  HCT 22.2* 22.2* 24.4* 23.1* 23.5*  MCV 96.5 96.1 96.8 95.9 96.3  PLT 272 310 348 370 452*   Cardiac Enzymes: No results for input(s): "CKTOTAL", "CKMB", "CKMBINDEX", "TROPONINI" in the last 168 hours. BNP (last 3 results) No results for input(s): "PROBNP" in the last 8760 hours. CBG: No results for input(s): "GLUCAP" in the last 168 hours. D-Dimer: No results for input(s): "DDIMER" in  the last 72 hours. Hgb A1c: No results for input(s): "HGBA1C" in the last 72 hours. Lipid Profile: No results for input(s): "CHOL", "HDL", "LDLCALC", "TRIG", "CHOLHDL", "LDLDIRECT" in the last 72 hours. Thyroid function studies: No results for input(s): "TSH", "T4TOTAL", "T3FREE", "THYROIDAB" in the last 72 hours.  Invalid input(s): "FREET3" Anemia work up: No results for input(s): "VITAMINB12", "FOLATE", "FERRITIN", "TIBC", "IRON", "RETICCTPCT" in the last 72 hours. Sepsis Labs: Recent Labs  Lab 02/19/24 0538 02/20/24 0409 02/21/24 0329 02/22/24 0924  WBC 8.9 7.6 8.5 9.4   Microbiology Recent Results (from the past 240 hours)  Surgical pcr screen     Status: None   Collection Time: 02/14/24  1:19 AM   Specimen: Nasal Mucosa; Nasal Swab  Result Value Ref Range Status   MRSA, PCR NEGATIVE NEGATIVE Final   Staphylococcus aureus NEGATIVE NEGATIVE Final    Comment: (NOTE) The Xpert SA Assay (FDA approved for NASAL specimens in patients 22 years of age and  older), is one component of a comprehensive surveillance program. It is not intended to diagnose infection nor to guide or monitor treatment. Performed at [facility], [address] [place], [place] [address]      Medications:    (feeding supplement) PROSource Plus  30 mL Oral BID BM   amoxicillin-clavulanate  1 tablet Oral QHS   arformoterol  15 mcg Nebulization BID   aspirin  325 mg Oral Daily   budesonide  0.5 mg Nebulization BID   darbepoetin (ARANESP) injection - DIALYSIS  60 mcg Subcutaneous Q Wed-1800   metoprolol tartrate  12.5 mg Oral BID   olopatadine  1 drop Both Eyes BID   polyethylene glycol  17 g Oral BID   revefenacin  175 mcg Nebulization Daily   senna-docusate  1 tablet Oral BID   sevelamer carbonate  2.4 g Oral TID WC   Continuous Infusions:      LOS: 9 days    Time spent: 35 minutes.  Doroteo Gasmen, MD  [facility]  02/22/2024, 6:49 PM

## 2024-02-22 NOTE — Progress Notes (Signed)
 [facility] Progress Note   Subjective:   Pt seen in room, sleeping off and on but does follow simple commands.   Objective Vitals:   02/21/24 1636 02/21/24 2110 02/22/24 0156 02/22/24 0438  BP: 139/64 117/68  (!) 153/50  Pulse: 87 85  80  Resp: 16 19  (!) 21  Temp: (!) 97.5 F (36.4 C)   98.2 F (36.8 C)  TempSrc:    Oral  SpO2: 98% 93% 95% 91%  Weight:      Height:       Physical Exam General: elderly appearing male, NAD Heart: RRR, 2/6 systolic murmur Lungs: CTA anteriorly, respirations unlabored Abdomen: Soft, non-distended Extremities: No edema b/l lower extremities Dialysis Access:  LUE AVF + t/b  Additional Objective Labs: Basic Metabolic Panel: Recent Labs  Lab 02/17/24 0805 02/18/24 0549 02/19/24 0538 02/20/24 0409 02/21/24 0329  NA 136   < > 133* 133* 134*  K 5.2*   < > 5.5* 4.8 5.2*  CL 93*   < > 90* 91* 92*  CO2 25   < > 26 29 25   GLUCOSE 114*   < > 89 90 95  BUN 76*   < > 77* 47* 77*  CREATININE 8.47*   < > 7.26* 4.96* 6.62*  CALCIUM 8.3*   < > 8.5* 8.7* 8.5*  PHOS 7.4*  --   --   --   --    < > = values in this interval not displayed.   Liver Function Tests: Recent Labs  Lab 02/17/24 0805  ALBUMIN 2.9*   No results for input(s): "LIPASE", "AMYLASE" in the last 168 hours. CBC: Recent Labs  Lab 02/17/24 0740 02/18/24 0549 02/19/24 0538 02/20/24 0409 02/21/24 0329  WBC 12.3* 8.6 8.9 7.6 8.5  HGB 7.7* 7.5* 7.4* 8.0* 8.0*  HCT 23.5* 22.2* 22.2* 24.4* 23.1*  MCV 97.9 96.5 96.1 96.8 95.9  PLT 260 272 310 348 370   Blood Culture    Component Value Date/Time   SDES TRACHEAL ASPIRATE 08/11/2023 0807   SPECREQUEST NONE 08/11/2023 0807   CULT FEW PSEUDOMONAS AERUGINOSA 08/11/2023 0807   REPTSTATUS 08/13/2023 FINAL 08/11/2023 0807    Cardiac Enzymes: No results for input(s): "CKTOTAL", "CKMB", "CKMBINDEX", "TROPONINI" in the last 168 hours. CBG: No results for input(s): "GLUCAP" in the last 168 hours. Iron Studies: No  results for input(s): "IRON", "TIBC", "TRANSFERRIN", "FERRITIN" in the last 72 hours. @lablastinr3 @ Studies/Results: DG Shoulder Left Result Date: 02/21/2024 CLINICAL DATA:  Left shoulder pain EXAM: LEFT SHOULDER - 2+ VIEW COMPARISON:  February 13 2024 FINDINGS: Osteopenia. No acute fracture or dislocation. There is no evidence of arthropathy or other focal bone abnormality. Soft tissues are unremarkable. Aortic and peripheral vascular atherosclerosis. IMPRESSION: Osteopenia.  No acute fracture or dislocation. Electronically Signed   By: Rance Burrows M.D.   On: 02/21/2024 16:16   Medications:   (feeding supplement) PROSource Plus  30 mL Oral BID BM   amoxicillin-clavulanate  1 tablet Oral QHS   arformoterol  15 mcg Nebulization BID   aspirin  325 mg Oral Daily   budesonide  0.5 mg Nebulization BID   darbepoetin (ARANESP) injection - DIALYSIS  60 mcg Subcutaneous Q Wed-1800   metoprolol tartrate  12.5 mg Oral BID   olopatadine  1 drop Both Eyes BID   polyethylene glycol  17 g Oral BID   revefenacin  175 mcg Nebulization Daily   senna-docusate  1 tablet Oral BID   sevelamer carbonate  2.4 g  Oral TID WC    OP Dialysis Orders: TTS - AF 3hr, 400/600, EDW 68.5kg, 2K/2Ca, UFP #2, LUE AVF, heparin 2500 unit q HD - no ESA d/t bladder cancer - Getting course of IV iron - Hectoral 3mcg IV q HD  Assessment/Plan: R femoral neck Fx: S/p 3 screws surgically placed 4/5. ESRD: Continue HD on TTS schedule, tolerating HD, next treatment Tuesday Pneumonia: Per CXR, L base infiltrate. Productive cough. On Augmentin. HTN/volume: BP high but improving, no edema on exam - above prior EDW. 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  Hx bladder cancer- > but s/p TURBT 12/2021. No contraindication to ESA at this time - after discussion with family, was resumed, Aranesp 60mcg given 4/9. Secondary HPTH: Ca ok, Phos a little high - continue binders. Resume VDRA as outpatient. Nutrition: Alb low, adding protein  supplements. COPD: Uses O2 at baseline. Dispo: SNF placement pending. Delirium / sundownign:  per [facility]  Ramona Burner, PA-C 02/22/2024, 8:42 AM  [facility] Pager: [phone]

## 2024-02-22 NOTE — Progress Notes (Signed)
   02/22/24 0200  BiPAP/CPAP/SIPAP  Reason BIPAP/CPAP not in use Other(comment) (see note)   This RT attempted to placed pt on CPAP, the mask that we have here does not fit him, he hangs his mouth open and the bottom of the mask gets in his mouth and leaks all the pressure out. I will let the nurse know to tell his wife if he has a mask and machine at home that works for him she can bring it for him.

## 2024-02-22 NOTE — Progress Notes (Signed)
 Cpap held last night d/t intolerance.  Sitter stated pt does better with [place]. Cpap at bedside.  Sitter spoken with and if she decides he needs t she will reach out. RT will continue to monitor.

## 2024-02-22 NOTE — Plan of Care (Signed)

## 2024-02-23 ENCOUNTER — Encounter

## 2024-02-23 DIAGNOSIS — E785 Hyperlipidemia, unspecified: Secondary | ICD-10-CM | POA: Diagnosis present

## 2024-02-23 DIAGNOSIS — D8481 Immunodeficiency due to conditions classified elsewhere: Secondary | ICD-10-CM | POA: Diagnosis not present

## 2024-02-23 DIAGNOSIS — I132 Hypertensive heart and chronic kidney disease with heart failure and with stage 5 chronic kidney disease, or end stage renal disease: Secondary | ICD-10-CM | POA: Diagnosis present

## 2024-02-23 DIAGNOSIS — R1310 Dysphagia, unspecified: Secondary | ICD-10-CM | POA: Diagnosis not present

## 2024-02-23 DIAGNOSIS — S7291XA Unspecified fracture of right femur, initial encounter for closed fracture: Secondary | ICD-10-CM | POA: Diagnosis not present

## 2024-02-23 DIAGNOSIS — Z9181 History of falling: Secondary | ICD-10-CM | POA: Diagnosis not present

## 2024-02-23 DIAGNOSIS — G9341 Metabolic encephalopathy: Secondary | ICD-10-CM | POA: Diagnosis not present

## 2024-02-23 DIAGNOSIS — T8484XD Pain due to internal orthopedic prosthetic devices, implants and grafts, subsequent encounter: Secondary | ICD-10-CM | POA: Diagnosis not present

## 2024-02-23 DIAGNOSIS — E440 Moderate protein-calorie malnutrition: Secondary | ICD-10-CM | POA: Diagnosis not present

## 2024-02-23 DIAGNOSIS — R4182 Altered mental status, unspecified: Secondary | ICD-10-CM | POA: Diagnosis not present

## 2024-02-23 DIAGNOSIS — J449 Chronic obstructive pulmonary disease, unspecified: Secondary | ICD-10-CM | POA: Diagnosis present

## 2024-02-23 DIAGNOSIS — J42 Unspecified chronic bronchitis: Secondary | ICD-10-CM | POA: Diagnosis not present

## 2024-02-23 DIAGNOSIS — Z992 Dependence on renal dialysis: Secondary | ICD-10-CM | POA: Diagnosis not present

## 2024-02-23 DIAGNOSIS — I120 Hypertensive chronic kidney disease with stage 5 chronic kidney disease or end stage renal disease: Secondary | ICD-10-CM | POA: Diagnosis not present

## 2024-02-23 DIAGNOSIS — R0602 Shortness of breath: Secondary | ICD-10-CM | POA: Diagnosis not present

## 2024-02-23 DIAGNOSIS — R41841 Cognitive communication deficit: Secondary | ICD-10-CM | POA: Diagnosis not present

## 2024-02-23 DIAGNOSIS — Z66 Do not resuscitate: Secondary | ICD-10-CM | POA: Diagnosis present

## 2024-02-23 DIAGNOSIS — J432 Centrilobular emphysema: Secondary | ICD-10-CM | POA: Diagnosis not present

## 2024-02-23 DIAGNOSIS — G4733 Obstructive sleep apnea (adult) (pediatric): Secondary | ICD-10-CM | POA: Diagnosis not present

## 2024-02-23 DIAGNOSIS — Z87891 Personal history of nicotine dependence: Secondary | ICD-10-CM | POA: Diagnosis not present

## 2024-02-23 DIAGNOSIS — E875 Hyperkalemia: Secondary | ICD-10-CM | POA: Diagnosis present

## 2024-02-23 DIAGNOSIS — J690 Pneumonitis due to inhalation of food and vomit: Secondary | ICD-10-CM | POA: Diagnosis present

## 2024-02-23 DIAGNOSIS — I5032 Chronic diastolic (congestive) heart failure: Secondary | ICD-10-CM | POA: Diagnosis present

## 2024-02-23 DIAGNOSIS — R2689 Other abnormalities of gait and mobility: Secondary | ICD-10-CM | POA: Diagnosis not present

## 2024-02-23 DIAGNOSIS — I2510 Atherosclerotic heart disease of native coronary artery without angina pectoris: Secondary | ICD-10-CM | POA: Diagnosis not present

## 2024-02-23 DIAGNOSIS — Z9981 Dependence on supplemental oxygen: Secondary | ICD-10-CM | POA: Diagnosis not present

## 2024-02-23 DIAGNOSIS — Z743 Need for continuous supervision: Secondary | ICD-10-CM | POA: Diagnosis not present

## 2024-02-23 DIAGNOSIS — Z882 Allergy status to sulfonamides status: Secondary | ICD-10-CM | POA: Diagnosis not present

## 2024-02-23 DIAGNOSIS — Z79899 Other long term (current) drug therapy: Secondary | ICD-10-CM | POA: Diagnosis not present

## 2024-02-23 DIAGNOSIS — Z7951 Long term (current) use of inhaled steroids: Secondary | ICD-10-CM | POA: Diagnosis not present

## 2024-02-23 DIAGNOSIS — Z880 Allergy status to penicillin: Secondary | ICD-10-CM | POA: Diagnosis not present

## 2024-02-23 DIAGNOSIS — N2581 Secondary hyperparathyroidism of renal origin: Secondary | ICD-10-CM | POA: Diagnosis not present

## 2024-02-23 DIAGNOSIS — M4982 Spondylopathy in diseases classified elsewhere, cervical region: Secondary | ICD-10-CM | POA: Diagnosis not present

## 2024-02-23 DIAGNOSIS — J9601 Acute respiratory failure with hypoxia: Secondary | ICD-10-CM | POA: Diagnosis not present

## 2024-02-23 DIAGNOSIS — S7291XD Unspecified fracture of right femur, subsequent encounter for closed fracture with routine healing: Secondary | ICD-10-CM | POA: Diagnosis not present

## 2024-02-23 DIAGNOSIS — J80 Acute respiratory distress syndrome: Secondary | ICD-10-CM | POA: Diagnosis not present

## 2024-02-23 DIAGNOSIS — N400 Enlarged prostate without lower urinary tract symptoms: Secondary | ICD-10-CM | POA: Diagnosis present

## 2024-02-23 DIAGNOSIS — M6281 Muscle weakness (generalized): Secondary | ICD-10-CM | POA: Diagnosis not present

## 2024-02-23 DIAGNOSIS — J952 Acute pulmonary insufficiency following nonthoracic surgery: Secondary | ICD-10-CM | POA: Diagnosis not present

## 2024-02-23 DIAGNOSIS — J99 Respiratory disorders in diseases classified elsewhere: Secondary | ICD-10-CM | POA: Diagnosis not present

## 2024-02-23 DIAGNOSIS — D631 Anemia in chronic kidney disease: Secondary | ICD-10-CM | POA: Diagnosis present

## 2024-02-23 DIAGNOSIS — Z515 Encounter for palliative care: Secondary | ICD-10-CM | POA: Diagnosis not present

## 2024-02-23 DIAGNOSIS — J9621 Acute and chronic respiratory failure with hypoxia: Secondary | ICD-10-CM | POA: Diagnosis present

## 2024-02-23 DIAGNOSIS — R0902 Hypoxemia: Secondary | ICD-10-CM | POA: Diagnosis not present

## 2024-02-23 DIAGNOSIS — G9340 Encephalopathy, unspecified: Secondary | ICD-10-CM | POA: Diagnosis present

## 2024-02-23 DIAGNOSIS — S79929A Unspecified injury of unspecified thigh, initial encounter: Secondary | ICD-10-CM | POA: Diagnosis not present

## 2024-02-23 DIAGNOSIS — S72011D Unspecified intracapsular fracture of right femur, subsequent encounter for closed fracture with routine healing: Secondary | ICD-10-CM | POA: Diagnosis not present

## 2024-02-23 DIAGNOSIS — Z7982 Long term (current) use of aspirin: Secondary | ICD-10-CM | POA: Diagnosis not present

## 2024-02-23 DIAGNOSIS — I959 Hypotension, unspecified: Secondary | ICD-10-CM | POA: Diagnosis not present

## 2024-02-23 DIAGNOSIS — J9622 Acute and chronic respiratory failure with hypercapnia: Secondary | ICD-10-CM | POA: Diagnosis not present

## 2024-02-23 DIAGNOSIS — S72001D Fracture of unspecified part of neck of right femur, subsequent encounter for closed fracture with routine healing: Secondary | ICD-10-CM | POA: Diagnosis not present

## 2024-02-23 DIAGNOSIS — R918 Other nonspecific abnormal finding of lung field: Secondary | ICD-10-CM | POA: Diagnosis not present

## 2024-02-23 DIAGNOSIS — Z8551 Personal history of malignant neoplasm of bladder: Secondary | ICD-10-CM | POA: Diagnosis not present

## 2024-02-23 DIAGNOSIS — R1312 Dysphagia, oropharyngeal phase: Secondary | ICD-10-CM | POA: Diagnosis not present

## 2024-02-23 DIAGNOSIS — Z85828 Personal history of other malignant neoplasm of skin: Secondary | ICD-10-CM | POA: Diagnosis not present

## 2024-02-23 DIAGNOSIS — N186 End stage renal disease: Secondary | ICD-10-CM | POA: Diagnosis present

## 2024-02-23 DIAGNOSIS — M4696 Unspecified inflammatory spondylopathy, lumbar region: Secondary | ICD-10-CM | POA: Diagnosis not present

## 2024-02-23 MED ORDER — METOPROLOL TARTRATE 25 MG PO TABS: 12.5000 mg | ORAL_TABLET | Freq: Two times a day (BID) | ORAL | 1 refills | Status: DC

## 2024-02-23 MED ORDER — AMOXICILLIN-POT CLAVULANATE 500-125 MG PO TABS: 1.0000 | ORAL_TABLET | Freq: Every day | ORAL | 0 refills | Status: DC

## 2024-02-23 MED ORDER — ACETAMINOPHEN 325 MG PO TABS: 650.0000 mg | ORAL_TABLET | Freq: Four times a day (QID) | ORAL | Status: DC | PRN

## 2024-02-23 MED ORDER — POLYETHYLENE GLYCOL 3350 17 G PO PACK: 17.0000 g | PACK | Freq: Two times a day (BID) | ORAL | 0 refills | Status: DC

## 2024-02-23 MED ORDER — SENNOSIDES-DOCUSATE SODIUM 8.6-50 MG PO TABS: 1.0000 | ORAL_TABLET | Freq: Two times a day (BID) | ORAL | 0 refills | Status: DC

## 2024-02-23 MED ORDER — ASPIRIN 325 MG PO TABS: 325.0000 mg | ORAL_TABLET | Freq: Every day | ORAL | Status: DC

## 2024-02-23 MED ORDER — DARBEPOETIN ALFA 60 MCG/0.3ML IJ SOSY: 60.0000 ug | PREFILLED_SYRINGE | INTRAMUSCULAR | Status: DC

## 2024-02-23 MED ORDER — SODIUM ZIRCONIUM CYCLOSILICATE 10 G PO PACK
10.0000 g | PACK | Freq: Once | ORAL | Status: AC
  Administered 2024-02-23: 10 g via ORAL
  Filled 2024-02-23: qty 1

## 2024-02-23 MED ORDER — PROSOURCE PLUS PO LIQD: 30.0000 mL | Freq: Two times a day (BID) | ORAL | Status: DC

## 2024-02-23 NOTE — Progress Notes (Signed)
 [facility] Progress Note   Subjective: Seen in room. Oriented to self only. Discharge today to SNF. HD tomorrow at OP clinic     Objective Vitals:   02/23/24 0626 02/23/24 0700 02/23/24 0923 02/23/24 0928  BP: (!) 162/52     Pulse: 78     Resp: (!) 46 20    Temp: 98.4 F (36.9 C)     TempSrc: Oral     SpO2: 97%  97% 96%  Weight:      Height:       Physical Exam General: elderly appearing male, NAD Heart: RRR, 2/6 systolic murmur Lungs: CTA anteriorly, respirations unlabored Abdomen: Soft, non-distended Extremities: No edema b/l lower extremities Dialysis Access:  LUE AVF + t/b  Additional Objective Labs: Basic Metabolic Panel: Recent Labs  Lab 02/17/24 0805 02/18/24 0549 02/20/24 0409 02/21/24 0329 02/22/24 0924  NA 136   < > 133* 134* 131*  K 5.2*   < > 4.8 5.2* 5.3*  CL 93*   < > 91* 92* 90*  CO2 25   < > 29 25 27   GLUCOSE 114*   < > 90 95 130*  BUN 76*   < > 47* 77* 57*  CREATININE 8.47*   < > 4.96* 6.62* 5.11*  CALCIUM 8.3*   < > 8.7* 8.5* 9.0  PHOS 7.4*  --   --   --   --    < > = values in this interval not displayed.   Liver Function Tests: Recent Labs  Lab 02/17/24 0805  ALBUMIN 2.9*   No results for input(s): "LIPASE", "AMYLASE" in the last 168 hours. CBC: Recent Labs  Lab 02/18/24 0549 02/19/24 0538 02/20/24 0409 02/21/24 0329 02/22/24 0924  WBC 8.6 8.9 7.6 8.5 9.4  HGB 7.5* 7.4* 8.0* 8.0* 7.8*  HCT 22.2* 22.2* 24.4* 23.1* 23.5*  MCV 96.5 96.1 96.8 95.9 96.3  PLT 272 310 348 370 452*   Blood Culture    Component Value Date/Time   SDES TRACHEAL ASPIRATE 08/11/2023 0807   SPECREQUEST NONE 08/11/2023 0807   CULT FEW PSEUDOMONAS AERUGINOSA 08/11/2023 0807   REPTSTATUS 08/13/2023 FINAL 08/11/2023 0807    Cardiac Enzymes: No results for input(s): "CKTOTAL", "CKMB", "CKMBINDEX", "TROPONINI" in the last 168 hours. CBG: No results for input(s): "GLUCAP" in the last 168 hours. Iron Studies: No results for input(s):  "IRON", "TIBC", "TRANSFERRIN", "FERRITIN" in the last 72 hours. @lablastinr3 @ Studies/Results: DG Shoulder Left Result Date: 02/21/2024 CLINICAL DATA:  Left shoulder pain EXAM: LEFT SHOULDER - 2+ VIEW COMPARISON:  February 13 2024 FINDINGS: Osteopenia. No acute fracture or dislocation. There is no evidence of arthropathy or other focal bone abnormality. Soft tissues are unremarkable. Aortic and peripheral vascular atherosclerosis. IMPRESSION: Osteopenia.  No acute fracture or dislocation. Electronically Signed   By: Wallie Char M.D.   On: 02/21/2024 16:16   Medications:   (feeding supplement) PROSource Plus  30 mL Oral BID BM   amoxicillin-clavulanate  1 tablet Oral QHS   arformoterol  15 mcg Nebulization BID   aspirin  325 mg Oral Daily   budesonide  0.5 mg Nebulization BID   darbepoetin (ARANESP) injection - DIALYSIS  60 mcg Subcutaneous Q Wed-1800   metoprolol tartrate  12.5 mg Oral BID   olopatadine  1 drop Both Eyes BID   polyethylene glycol  17 g Oral BID   revefenacin  175 mcg Nebulization Daily   senna-docusate  1 tablet Oral BID   sevelamer carbonate  2.4 g Oral  TID WC   sodium zirconium cyclosilicate  10 g Oral Once     OP Dialysis Orders: TTS - AF 3hr, 400/600, EDW 68.5kg, 2K/2Ca, UFP #2, LUE AVF, heparin 2500 unit q HD - no ESA d/t bladder cancer - Getting course of IV iron - Hectoral 3mcg IV q HD   Assessment/Plan: R femoral neck Fx: S/p 3 screws surgically placed 4/5. ESRD: Continue HD on TTS schedule, tolerating HD, next treatment 02/20/2024 at [facility] Pneumonia: Per CXR, L base infiltrate. Productive cough. On Augmentin. HTN/volume: BP high but improving, no edema on exam - above prior EDW. 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  Hx bladder cancer- > but s/p TURBT 12/2021. No contraindication to ESA at this time - after discussion with family, was resumed, Aranesp 60mcg given 4/9. Secondary HPTH: Ca ok, Phos a little high - continue binders. Resume VDRA as  outpatient. Nutrition: Alb low, adding protein supplements. COPD: Uses O2 at baseline. Dispo: SNF placement-[facility]. Delirium / sundownign:  per TRH   Nylan Nevel H. Trameka Dorough NP-C 02/23/2024, 11:22 AM  [employer] [phone]

## 2024-02-23 NOTE — NC FL2 (Signed)
 [place]  MEDICAID FL2 LEVEL OF CARE FORM     IDENTIFICATION  Patient Name: Jeremy [name] Birthdate: [birth year] Sex: male Admission Date (Current Location): 02/13/2024  [facility] and [place] Number:  Producer, television/film/video and Address:  The . [facility], [address] [place], [place] [address]      Provider Number: [id number]  Attending Physician Name and Address:  Doroteo Gasmen, MD  Relative Name and Phone Number:  Bezek,Pat Spouse [phone]  [phone]    Current Level of Care: Hospital Recommended Level of Care: Skilled Nursing Facility Prior Approval Number:    Date Approved/Denied:   PASRR Number: [id number]  Discharge Plan: SNF    Current Diagnoses: Patient Active Problem List   Diagnosis Date Noted   Right femoral fracture (HCC) 02/13/2024   Malnutrition of moderate degree 08/13/2023   Hypoxia 08/12/2023   Chronic respiratory failure with hypoxia (HCC) 08/12/2023   Other disorders of phosphorus metabolism 06/25/2023   Malignant neoplasm of bladder, unspecified (HCC) 01/06/2023   ESRD (end stage renal disease) (HCC) 10/24/2022   Gait instability 10/24/2022   Neck pain on right side 04/23/2022   Intermediate stage nonexudative age-related macular degeneration of left eye 08/17/2020   Pseudophakia 08/17/2020   Degenerative retinal drusen of left eye 08/17/2020   Advanced nonexudative age-related macular degeneration of right eye with subfoveal involvement 08/17/2020   Senile purpura (HCC) 08/10/2020   Aortic atherosclerosis (HCC) 08/10/2020   Cerumen impaction 08/10/2020   Skin lesion 07/06/2019   Hyperlipidemia 01/05/2019   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   Cigarette smoker 03/17/2018   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   OSA (obstructive sleep apnea) 05/23/2016   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   Osteoarthritis 01/29/2010   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    History of bladder cancer 10/10/2008   Chronic bronchitis (HCC) 10/10/2008   Diverticulosis of large intestine 10/10/2008   COLONIC POLYPS 12/07/2007   Essential hypertension 12/07/2007   Hemorrhoids 12/07/2007   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    Orientation RESPIRATION BLADDER Height & Weight     Self, Time, Place  O2 Continent Weight: 147 lb 7.8 oz (66.9 kg) Height:  5\' 10"  (177.8 cm)  BEHAVIORAL SYMPTOMS/MOOD NEUROLOGICAL BOWEL NUTRITION STATUS      Continent Diet (see discharge summary)  AMBULATORY STATUS COMMUNICATION OF NEEDS Skin   Total Care Verbally Other (Comment) (ecchymosis, skin tears on R shoulder, R leg)                       Personal Care Assistance Level of Assistance  Bathing, Feeding, Dressing Bathing Assistance: Limited assistance Feeding assistance: Limited assistance Dressing Assistance: Limited assistance     Functional Limitations Info  Sight, Hearing, Speech Sight Info: Adequate Hearing Info: Impaired Speech Info: Adequate    SPECIAL CARE FACTORS FREQUENCY  PT (By licensed PT), OT (By licensed OT)     PT Frequency: 5x week OT Frequency: 5x week            Contractures Contractures Info: Not present    Additional Factors Info  Code Status, Allergies Code Status Info: full Allergies Info: Sulfa Antibiotics, Penicillins, Tape           Current Medications (02/23/2024):  This is the current hospital active medication list Current Facility-Administered Medications  Medication Dose Route Frequency Provider Last Rate Last Admin   (feeding supplement) PROSource Plus liquid 30 mL  30 mL Oral BID BM Stovall, Kathryn R, PA-C   30 mL at 02/23/24 0845   acetaminophen (TYLENOL) tablet 650 mg  650 mg Oral Q6H PRN Wilhelmenia Harada, MD   650 mg at 02/20/24 0533   amoxicillin-clavulanate (AUGMENTIN) 500-125 MG per tablet 1 tablet  1 tablet Oral QHS Chen, Lydia D, RPH   1 tablet at 02/22/24 2202   antiseptic  oral rinse (BIOTENE) solution 30 mL  2 Application Mouth Rinse PRN Wilhelmenia Harada, MD       arformoterol [facility]) nebulizer solution 15 mcg  15 mcg Nebulization BID Wilhelmenia Harada, MD   15 mcg at 02/23/24 0920   aspirin tablet 325 mg  325 mg Oral Daily Bokshan, Steven, MD   325 mg at 02/23/24 0842   budesonide (PULMICORT) nebulizer solution 0.5 mg  0.5 mg Nebulization BID Wilhelmenia Harada, MD   0.5 mg at 02/23/24 0920   Darbepoetin Alfa (ARANESP) injection 60 mcg  60 mcg Subcutaneous Q Wed-1800 Stovall, Kathryn R, PA-C   60 mcg at 02/18/24 2211   metoprolol tartrate (LOPRESSOR) tablet 12.5 mg  12.5 mg Oral BID Bokshan, Steven, MD   12.5 mg at 02/23/24 0841   olopatadine (PATANOL) 0.1 % ophthalmic solution 1 drop  1 drop Both Eyes BID Wilhelmenia Harada, MD   1 drop at 02/23/24 0846   polyethylene glycol (MIRALAX / GLYCOLAX) packet 17 g  17 g Oral BID Haydee Lipa, MD   17 g at 02/23/24 0845   prochlorperazine (COMPAZINE) injection 5 mg  5 mg Intravenous Q6H PRN Wilhelmenia Harada, MD       revefenacin (YUPELRI) nebulizer solution 175 mcg  175 mcg Nebulization Daily Wilhelmenia Harada, MD   175 mcg at 02/23/24 0920   senna-docusate (Senokot-S) tablet 1 tablet  1 tablet Oral BID Haydee Lipa, MD   1 tablet at 02/23/24 0841   sevelamer carbonate (RENVELA) powder PACK 2.4 g  2.4 g Oral TID WC Wilhelmenia Harada, MD   2.4 g at 02/23/24 1135   sodium zirconium cyclosilicate (LOKELMA) packet 10 g  10 g Oral Once Ogbata, Sylvester I, MD         Discharge Medications: Please see discharge summary for a list of discharge medications.  Relevant Imaging Results:  Relevant Lab Results:   Additional Information [id number].  HD pt, currently TTS schedule  Esli Clements, Merrie Abed, LCSW

## 2024-02-23 NOTE — Progress Notes (Signed)
 RN called [facility] and gave report to [facility] and she stated understanding. Pt is dressed and belongings have been packed. Pt caregiver at bedside . Waiting for PTAR.

## 2024-02-23 NOTE — Progress Notes (Signed)
 Speech Language Pathology Treatment: Dysphagia  Patient Details Name: Jeremy [name] MRN: [id number] DOB: Apr 06, 1941 Today's Date: 02/23/2024 Time: 7829-5621 SLP Time Calculation (min) (ACUTE ONLY): 50 min  Assessment / Plan / Recommendation Clinical Impression  Patient seen by SLP for skilled treatment focused on dysphagia goals. His private home caregiver, Barrett Lick, was present in the room. Patient himself was sleeping but would awaken easily. He continues to exhibit mouth breathing and falls asleep quickly. Barrett Lick feels that this is more frequent today than usual and she reported he had a restless night last night. Prior to giving PO's, SLP discussed patient with Kat. She indicated that patient was eating well prior to the fall that led to current admission. She also told SLP that in the past, when he was more lucid, he would at times become frustrated with restrictions on not being able to smoke, having diet consistency modifications, etc. Patient did become alert enough to have some PO's and he requested coffee. SLP gave him spoon sips of coffee and he would then reach for the cup. SLP provided assistance with cup sips as patient tends to be impulsive with PO intake. Swallow initiation appeared delayed but no overt s/s observed. At end of session after PO's, he did exhibit an instance of expectoration of thick yellow-tan secretions which SLP suctioned out of anterior portion of oral cavity. SLP recommending to continue with current diet restrictions and strategies (chin tuck with spoon sips honey thick liquids). He will benefit from f/u by SLP at [facility] upon discharge.   HPI HPI: Patient is an 83 year old male presenting with femoral fx after a mechanical fall. Patient with PMH of COPD, obstructive sleep apnea (on CPAP) end-stage renal disease on hemodialysis, HFpEF, hx of bladder cancer. Patient seen for MBS on 12/08/23 recommending D3/NTL. Patient with "silent aspiration (PAS 8) of nectar thick liquids during  initial few sips and one instance of questionable silent aspiration with thin liquids (shoulder obscured view during at times). Subsequent sips of nectar thick liquids did not result in aspiration" Recent CXR notes "Question developing left base airspace opacity. Recommend repeat  PA and lateral view of the chest for further evaluation."      SLP Plan  Continue with current plan of care      Recommendations for follow up therapy are one component of a multi-disciplinary discharge planning process, led by the attending physician.  Recommendations may be updated based on patient status, additional functional criteria and insurance authorization.    Recommendations  Diet recommendations: Dysphagia 2 (fine chop);Honey-thick liquid;Other(comment) (liquids by spoon with chin tuck) Liquids provided via: Teaspoon Medication Administration: Crushed with puree Supervision: Full supervision/cueing for compensatory strategies Compensations: Minimize environmental distractions;Slow rate;Small sips/bites;Clear throat intermittently;Chin tuck Postural Changes and/or Swallow Maneuvers: Seated upright 90 degrees                  Oral care BID   Frequent or constant Supervision/Assistance Dysphagia, oropharyngeal phase (R13.12)     Continue with current plan of care     Jacqualine Mater, MA, CCC-SLP Speech Therapy

## 2024-02-23 NOTE — Discharge Planning (Signed)
 [facility] Patient Discharge Orders- [facility] CLINIC: [facility]  Patient's name: BHAVIN MONJARAZ Admit/DC Dates: 02/13/2024 - 02/23/2024  Discharge Diagnoses: Fall-R femoral neckfracture   PNA  Aranesp: Given: Yes   Date and amount of last dose: 02/18/2024  Last Hgb: 7.8 PRBC's Given: No Date/# of units: NA ESA dose for discharge:Resume mircera 100 mcg IV q 2 weeks IV Iron dose at discharge: Continue Fe load after patient finishes oral ABX  Heparin change: No  EDW Change: No New EDW: NA  Bath Change: NO  Access intervention/Change: NA Details:  Hectorol/Calcitriol change: No  Discharge Labs: Calcium 9.0 Phosphorus 7.4 Albumin 2.9 K+ 5.3  IV Antibiotics: NO Details:  On Coumadin?: NO Last INR: Next INR: Managed By:   OTHER/APPTS/LAB ORDERS:    D/C Meds to be reconciled by nurse after every discharge.  Completed By: Jacobo Masters [facility] [id number]    Reviewed by: MD:______ RN_______

## 2024-02-23 NOTE — TOC Transition Note (Signed)
 Transition of Care [facility]) - Discharge Note   Patient Details  Name: Jeremy [name] MRN: [id number] Date of Birth: [birth year]  Transition of Care [facility]) CM/SW Contact:  Elspeth Hals, LCSW Phone Number: 02/23/2024, 11:46 AM   Clinical Narrative:   Pt discharging to [facility], room 604p.  RN call report to [phone].  PTAR called [phone].    0900: CSW confirmed with [facility] that they can receive pt today.  [facility] notified for outpt HD.   Final next level of care: Skilled Nursing Facility Barriers to Discharge: Barriers Resolved   Patient Goals and CMS Choice   CMS Medicare.gov Compare Post Acute Care list provided to:: Patient Represenative (must comment) (wife) Choice offered to / list presented to : Spouse      Discharge Placement              Patient chooses bed at: [facility] Patient to be transferred to facility by: ptar Name of family member notified: wife [name] Face in room Patient and family notified of of transfer: 02/23/24  Discharge Plan and Services Additional resources added to the After Visit Summary for   In-house Referral: Clinical Social Work   Post Acute Care Choice: Skilled Nursing Facility                               Social Drivers of Health (SDOH) Interventions SDOH Screenings   Food Insecurity: No Food Insecurity (02/13/2024)  Housing: Low Risk  (02/13/2024)  Transportation Needs: No Transportation Needs (02/13/2024)  Utilities: Not At Risk (02/14/2024)  Depression (PHQ2-9): Medium Risk (01/19/2024)  Social Connections: Socially Integrated (02/14/2024)  Tobacco Use: Medium Risk (02/14/2024)     Readmission Risk Interventions     No data to display

## 2024-02-23 NOTE — Progress Notes (Signed)
 Contacted by CSW that pt should d/c to snf today. Contacted [facility] to be advised that pt will d/c to snf today and that pt should resume care tomorrow. SNF name provided to clinic as well. HD appts added to AVS.   Lauraine Polite Renal Navigator [phone]

## 2024-02-23 NOTE — Discharge Summary (Signed)
 Physician Discharge Summary  Patient ID: Jeremy [name] MRN: [id number] DOB/AGE: 1941/01/07 83 y.o.  Admit date: 02/13/2024 Discharge date: 02/23/2024  Admission Diagnoses:  Discharge Diagnoses:  Principal Problem:   Right femoral fracture (HCC) Active Problems:   Essential hypertension   ESRD (end stage renal disease) (HCC)   Chronic respiratory failure with hypoxia (HCC)   Discharged Condition: stable  Hospital Course: Patient is an 83 year old male with past medical history significant for COPD on 3 L of oxygen, obstructive sleep apnea on CPAP, end-stage renal disease on hemodialysis, HFpEF, and history of bladder cancer.  Patient was admitted with right femoral fracture following a mechanical fall.  Patient has undergone open reduction and internal fixation.  Orthopedic team directed surgical and postop management.  Patient has been cleared for discharge by the orthopedic team.  During the hospital stay, patient became acutely encephalopathy, likely metabolic, and secondary to aspiration pneumonia.  Patient will complete course of oral Augmentin.  Nephrology team directed dialysis treatment during the hospital stay.  Patient will be discharged to skilled nursing facility.  Patient will follow-up with primary care provider, nephrology team and orthopedic team on discharge.  Right femoral fracture: -Status post ORIF on 02/14/2024. - Orthopedic team directed patient's surgical care, as well as, postop care. - Patient will follow-up with orthopedic team on discharge. - Patient has been cleared for discharge by the orthopedic surgery team.     Acute metabolic encephalopathy in the setting of  aspiration pneumonia: Chest x-ray revealed right lower lobe opacity concerning for pneumonia. Patient will complete course of oral antibiotics, Augmentin.   Diet as directed by the speech therapy team. Acute encephalopathy has resolved.     Mild acute on chronic respiratory failure on 3 L of  oxygen: -Resolved. -Continue supplemental oxygen as needed.     Anemia of chronic renal disease/end-stage renal disease: -Last hemoglobin prior to discharge was 7.8 g/dL.     Chronic HFpEF: Fluid management per dialysis. Continue metoprolol. Continue hemodialysis on discharge.   Hyperkalemia: - Continue to monitor closely. - Continue hemodialysis..   End-stage renal disease on hemodialysis Tuesday Thursdays and Saturdays: Continue further management per renal.   Sleep apnea: Continue CPAP.  Consults: nephrology and orthopedic surgery  Significant Diagnostic Studies:     Discharge Exam: Blood pressure (!) 162/52, pulse 78, temperature 98.4 F (36.9 C), temperature source Oral, resp. rate 20, height 5\' 10"  (1.778 m), weight 66.9 kg, SpO2 96%.   Disposition: Discharge disposition: 03-Skilled Nursing Facility       Discharge Instructions     Diet - low sodium heart healthy   Complete by: As directed    Increase activity slowly   Complete by: As directed       Allergies as of 02/23/2024       Reactions   Sulfa Antibiotics    Rash, light headed, shaking   Melatonin Other (See Comments)   Difficulty breathing, anxiety   Penicillins Other (See Comments)   hives severe as a child 4/5 tolerated cefazolin   Seroquel [quetiapine] Other (See Comments)   Patient complains of throat blockage, saturations drops, unable to speak   Tape    Pulls skin off        Medication List     STOP taking these medications    AMBULATORY NON FORMULARY MEDICATION   IMODIUM PO   ipratropium 0.03 % nasal spray Commonly known as: ATROVENT   MUCINEX DM PO   multivitamin Liqd   Vitamin D 50 MCG (  2000 UT) Caps       TAKE these medications    (feeding supplement) PROSource Plus liquid Take 30 mLs by mouth 2 (two) times daily between meals.   acetaminophen 325 MG tablet Commonly known as: TYLENOL Take 2 tablets (650 mg total) by mouth every 6 (six) hours as needed  for mild pain (pain score 1-3), fever or headache.   AMBULATORY NON FORMULARY MEDICATION Please provide portable oxygen concentrator.  Flow 3L/min.  Dx: Chronic respiratory failure J96.11, COPD J42   amoxicillin-clavulanate 500-125 MG tablet Commonly known as: AUGMENTIN Take 1 tablet by mouth at bedtime for 2 days.   antiseptic oral rinse Liqd 2 Applications by Mouth Rinse route as needed for dry mouth.   arformoterol 15 MCG/2ML Nebu Commonly known as: BROVANA Substituted for: Brovana Neb Solution Inhale one vial in nebulizer twice a day. What changed: See the new instructions.   aspirin 325 MG tablet Take 1 tablet (325 mg total) by mouth daily. Start taking on: February 24, 2024   budesonide 0.5 MG/2ML nebulizer solution Commonly known as: PULMICORT Generic for Pulmicort. Inhale one vial in nebulizer twice a day. Rinse mouth after use. What changed: See the new instructions.   Darbepoetin Alfa 60 MCG/0.3ML Sosy injection Commonly known as: ARANESP Inject 0.3 mLs (60 mcg total) into the skin every Wednesday at 6 PM. Start taking on: February 25, 2024   metoprolol tartrate 25 MG tablet Commonly known as: LOPRESSOR Take 0.5 tablets (12.5 mg total) by mouth 2 (two) times daily. What changed: how much to take   oxyCODONE 5 MG immediate release tablet Commonly known as: Roxicodone Take 1 tablet (5 mg total) by mouth every 4 (four) hours as needed for severe pain (pain score 7-10) or breakthrough pain.   OXYGEN Inhale 3 L into the lungs continuous.   PATADAY OP Place 2 drops into both eyes as needed.   polyethylene glycol 17 g packet Commonly known as: MIRALAX / GLYCOLAX Take 17 g by mouth 2 (two) times daily.   senna-docusate 8.6-50 MG tablet Commonly known as: Senokot-S Take 1 tablet by mouth 2 (two) times daily.   sevelamer carbonate 2.4 g Pack Commonly known as: RENVELA Take 2.4 g by mouth 3 (three) times daily with meals. What changed: Another medication with the  same name was removed. Continue taking this medication, and follow the directions you see here.   Yupelri 175 MCG/3ML nebulizer solution Generic drug: revefenacin Inhale one vial in nebulizer once daily. Do not mix with other nebulized medications. What changed: See the new instructions.        Contact information for follow-up providers     Wilhelmenia Harada, MD Follow up.   Specialty: Orthopedic Surgery Contact information: [address] [place] [address] [phone]              Contact information for after-discharge care     Destination     [facility], [facility] Preferred SNF .   Service: Skilled Nursing Contact information: [address] [place]  [address] [phone]                    Time spent: 35 minutes.  SignedDoroteo Gasmen 02/23/2024, 10:49 AM

## 2024-02-24 ENCOUNTER — Emergency Department (HOSPITAL_COMMUNITY)

## 2024-02-24 ENCOUNTER — Other Ambulatory Visit: Payer: Self-pay

## 2024-02-24 ENCOUNTER — Encounter (HOSPITAL_COMMUNITY): Payer: Self-pay

## 2024-02-24 ENCOUNTER — Inpatient Hospital Stay (HOSPITAL_COMMUNITY)
Admission: EM | Admit: 2024-02-24 | Discharge: 2024-03-11 | DRG: 189 | Disposition: E | Source: Skilled Nursing Facility | Attending: Internal Medicine | Admitting: Internal Medicine

## 2024-02-24 DIAGNOSIS — I132 Hypertensive heart and chronic kidney disease with heart failure and with stage 5 chronic kidney disease, or end stage renal disease: Secondary | ICD-10-CM | POA: Diagnosis present

## 2024-02-24 DIAGNOSIS — Z880 Allergy status to penicillin: Secondary | ICD-10-CM | POA: Diagnosis not present

## 2024-02-24 DIAGNOSIS — M6281 Muscle weakness (generalized): Secondary | ICD-10-CM | POA: Diagnosis not present

## 2024-02-24 DIAGNOSIS — G9340 Encephalopathy, unspecified: Secondary | ICD-10-CM | POA: Diagnosis present

## 2024-02-24 DIAGNOSIS — J449 Chronic obstructive pulmonary disease, unspecified: Secondary | ICD-10-CM | POA: Diagnosis present

## 2024-02-24 DIAGNOSIS — N400 Enlarged prostate without lower urinary tract symptoms: Secondary | ICD-10-CM | POA: Diagnosis present

## 2024-02-24 DIAGNOSIS — Z91048 Other nonmedicinal substance allergy status: Secondary | ICD-10-CM

## 2024-02-24 DIAGNOSIS — Z79899 Other long term (current) drug therapy: Secondary | ICD-10-CM

## 2024-02-24 DIAGNOSIS — R0902 Hypoxemia: Principal | ICD-10-CM

## 2024-02-24 DIAGNOSIS — R918 Other nonspecific abnormal finding of lung field: Secondary | ICD-10-CM | POA: Diagnosis not present

## 2024-02-24 DIAGNOSIS — M4696 Unspecified inflammatory spondylopathy, lumbar region: Secondary | ICD-10-CM | POA: Diagnosis not present

## 2024-02-24 DIAGNOSIS — Z85828 Personal history of other malignant neoplasm of skin: Secondary | ICD-10-CM

## 2024-02-24 DIAGNOSIS — Z992 Dependence on renal dialysis: Secondary | ICD-10-CM

## 2024-02-24 DIAGNOSIS — Z66 Do not resuscitate: Secondary | ICD-10-CM | POA: Diagnosis present

## 2024-02-24 DIAGNOSIS — J42 Unspecified chronic bronchitis: Secondary | ICD-10-CM | POA: Diagnosis not present

## 2024-02-24 DIAGNOSIS — Z7951 Long term (current) use of inhaled steroids: Secondary | ICD-10-CM | POA: Diagnosis not present

## 2024-02-24 DIAGNOSIS — I120 Hypertensive chronic kidney disease with stage 5 chronic kidney disease or end stage renal disease: Secondary | ICD-10-CM | POA: Diagnosis not present

## 2024-02-24 DIAGNOSIS — E785 Hyperlipidemia, unspecified: Secondary | ICD-10-CM | POA: Diagnosis present

## 2024-02-24 DIAGNOSIS — J9621 Acute and chronic respiratory failure with hypoxia: Secondary | ICD-10-CM | POA: Diagnosis present

## 2024-02-24 DIAGNOSIS — J80 Acute respiratory distress syndrome: Secondary | ICD-10-CM | POA: Diagnosis not present

## 2024-02-24 DIAGNOSIS — J690 Pneumonitis due to inhalation of food and vomit: Secondary | ICD-10-CM | POA: Diagnosis present

## 2024-02-24 DIAGNOSIS — Z9981 Dependence on supplemental oxygen: Secondary | ICD-10-CM | POA: Diagnosis not present

## 2024-02-24 DIAGNOSIS — N186 End stage renal disease: Secondary | ICD-10-CM | POA: Diagnosis present

## 2024-02-24 DIAGNOSIS — Z888 Allergy status to other drugs, medicaments and biological substances status: Secondary | ICD-10-CM

## 2024-02-24 DIAGNOSIS — I2510 Atherosclerotic heart disease of native coronary artery without angina pectoris: Secondary | ICD-10-CM | POA: Diagnosis not present

## 2024-02-24 DIAGNOSIS — Z8701 Personal history of pneumonia (recurrent): Secondary | ICD-10-CM

## 2024-02-24 DIAGNOSIS — Z7982 Long term (current) use of aspirin: Secondary | ICD-10-CM

## 2024-02-24 DIAGNOSIS — J432 Centrilobular emphysema: Secondary | ICD-10-CM | POA: Diagnosis not present

## 2024-02-24 DIAGNOSIS — Z9181 History of falling: Secondary | ICD-10-CM | POA: Diagnosis not present

## 2024-02-24 DIAGNOSIS — Z882 Allergy status to sulfonamides status: Secondary | ICD-10-CM | POA: Diagnosis not present

## 2024-02-24 DIAGNOSIS — D631 Anemia in chronic kidney disease: Secondary | ICD-10-CM | POA: Diagnosis present

## 2024-02-24 DIAGNOSIS — Z515 Encounter for palliative care: Secondary | ICD-10-CM | POA: Diagnosis not present

## 2024-02-24 DIAGNOSIS — I5032 Chronic diastolic (congestive) heart failure: Secondary | ICD-10-CM | POA: Diagnosis present

## 2024-02-24 DIAGNOSIS — S7291XD Unspecified fracture of right femur, subsequent encounter for closed fracture with routine healing: Secondary | ICD-10-CM

## 2024-02-24 DIAGNOSIS — R0602 Shortness of breath: Secondary | ICD-10-CM | POA: Diagnosis not present

## 2024-02-24 DIAGNOSIS — E875 Hyperkalemia: Secondary | ICD-10-CM | POA: Diagnosis present

## 2024-02-24 DIAGNOSIS — J9601 Acute respiratory failure with hypoxia: Secondary | ICD-10-CM | POA: Diagnosis not present

## 2024-02-24 DIAGNOSIS — S72011D Unspecified intracapsular fracture of right femur, subsequent encounter for closed fracture with routine healing: Secondary | ICD-10-CM | POA: Diagnosis not present

## 2024-02-24 DIAGNOSIS — Z8551 Personal history of malignant neoplasm of bladder: Secondary | ICD-10-CM

## 2024-02-24 DIAGNOSIS — Z87891 Personal history of nicotine dependence: Secondary | ICD-10-CM

## 2024-02-24 DIAGNOSIS — D8481 Immunodeficiency due to conditions classified elsewhere: Secondary | ICD-10-CM | POA: Diagnosis not present

## 2024-02-24 LAB — CBC WITH DIFFERENTIAL/PLATELET
Abs Immature Granulocytes: 0.12 10*3/uL — ABNORMAL HIGH (ref 0.00–0.07)
Basophils Absolute: 0.1 10*3/uL (ref 0.0–0.1)
Basophils Relative: 1 %
Eosinophils Absolute: 0.2 10*3/uL (ref 0.0–0.5)
Eosinophils Relative: 2 %
HCT: 21.6 % — ABNORMAL LOW (ref 39.0–52.0)
Hemoglobin: 7.2 g/dL — ABNORMAL LOW (ref 13.0–17.0)
Immature Granulocytes: 1 %
Lymphocytes Relative: 16 %
Lymphs Abs: 1.8 10*3/uL (ref 0.7–4.0)
MCH: 32.4 pg (ref 26.0–34.0)
MCHC: 33.3 g/dL (ref 30.0–36.0)
MCV: 97.3 fL (ref 80.0–100.0)
Monocytes Absolute: 0.8 10*3/uL (ref 0.1–1.0)
Monocytes Relative: 8 %
Neutro Abs: 8 10*3/uL — ABNORMAL HIGH (ref 1.7–7.7)
Neutrophils Relative %: 72 %
Platelets: 537 10*3/uL — ABNORMAL HIGH (ref 150–400)
RBC: 2.22 MIL/uL — ABNORMAL LOW (ref 4.22–5.81)
RDW: 13.1 % (ref 11.5–15.5)
WBC: 11.1 10*3/uL — ABNORMAL HIGH (ref 4.0–10.5)
nRBC: 0 % (ref 0.0–0.2)

## 2024-02-24 LAB — COMPREHENSIVE METABOLIC PANEL WITH GFR
ALT: 6 U/L (ref 0–44)
AST: 12 U/L — ABNORMAL LOW (ref 15–41)
Albumin: 3.2 g/dL — ABNORMAL LOW (ref 3.5–5.0)
Alkaline Phosphatase: 57 U/L (ref 38–126)
Anion gap: 17 — ABNORMAL HIGH (ref 5–15)
BUN: 122 mg/dL — ABNORMAL HIGH (ref 8–23)
CO2: 26 mmol/L (ref 22–32)
Calcium: 8.8 mg/dL — ABNORMAL LOW (ref 8.9–10.3)
Chloride: 90 mmol/L — ABNORMAL LOW (ref 98–111)
Creatinine, Ser: 9.21 mg/dL — ABNORMAL HIGH (ref 0.61–1.24)
GFR, Estimated: 5 mL/min — ABNORMAL LOW (ref >60)
Glucose, Bld: 108 mg/dL — ABNORMAL HIGH (ref 70–99)
Potassium: 7.1 mmol/L (ref 3.5–5.1)
Sodium: 133 mmol/L — ABNORMAL LOW (ref 135–145)
Total Bilirubin: 0.4 mg/dL (ref 0.0–1.2)
Total Protein: 7.1 g/dL (ref 6.5–8.1)

## 2024-02-24 LAB — I-STAT VENOUS BLOOD GAS, ED
Acid-Base Excess: 1 mmol/L (ref 0.0–2.0)
Bicarbonate: 28.5 mmol/L — ABNORMAL HIGH (ref 20.0–28.0)
Calcium, Ion: 1.06 mmol/L — ABNORMAL LOW (ref 1.15–1.40)
HCT: 21 % — ABNORMAL LOW (ref 39.0–52.0)
Hemoglobin: 7.1 g/dL — ABNORMAL LOW (ref 13.0–17.0)
O2 Saturation: 99 %
Potassium: 6.9 mmol/L (ref 3.5–5.1)
Sodium: 129 mmol/L — ABNORMAL LOW (ref 135–145)
TCO2: 30 mmol/L (ref 22–32)
pCO2, Ven: 62 mmHg — ABNORMAL HIGH (ref 44–60)
pH, Ven: 7.271 (ref 7.25–7.43)
pO2, Ven: 150 mmHg — ABNORMAL HIGH (ref 32–45)

## 2024-02-24 LAB — I-STAT CHEM 8, ED
BUN: >130 mg/dL — ABNORMAL HIGH (ref 8–23)
Calcium, Ion: 1.04 mmol/L — ABNORMAL LOW (ref 1.15–1.40)
Chloride: 95 mmol/L — ABNORMAL LOW (ref 98–111)
Creatinine, Ser: 9.6 mg/dL — ABNORMAL HIGH (ref 0.61–1.24)
Glucose, Bld: 105 mg/dL — ABNORMAL HIGH (ref 70–99)
HCT: 23 % — ABNORMAL LOW (ref 39.0–52.0)
Hemoglobin: 7.8 g/dL — ABNORMAL LOW (ref 13.0–17.0)
Potassium: 6.8 mmol/L (ref 3.5–5.1)
Sodium: 129 mmol/L — ABNORMAL LOW (ref 135–145)
TCO2: 29 mmol/L (ref 22–32)

## 2024-02-24 LAB — I-STAT CG4 LACTIC ACID, ED
Lactic Acid, Venous: 0.5 mmol/L (ref 0.5–1.9)
Lactic Acid, Venous: 0.7 mmol/L (ref 0.5–1.9)

## 2024-02-24 LAB — CBG MONITORING, ED: Glucose-Capillary: 122 mg/dL — ABNORMAL HIGH (ref 70–99)

## 2024-02-24 MED ORDER — ALBUTEROL SULFATE (2.5 MG/3ML) 0.083% IN NEBU
5.0000 mg | INHALATION_SOLUTION | Freq: Once | RESPIRATORY_TRACT | Status: AC
  Administered 2024-02-24: 5 mg via RESPIRATORY_TRACT
  Filled 2024-02-24: qty 6

## 2024-02-24 MED ORDER — POLYVINYL ALCOHOL 1.4 % OP SOLN: 1.0000 [drp] | Freq: Four times a day (QID) | OPHTHALMIC | Status: DC | PRN

## 2024-02-24 MED ORDER — IOHEXOL 350 MG/ML SOLN
65.0000 mL | Freq: Once | INTRAVENOUS | Status: AC | PRN
  Administered 2024-02-24: 65 mL via INTRAVENOUS

## 2024-02-24 MED ORDER — GLYCOPYRROLATE 0.2 MG/ML IJ SOLN
0.2000 mg | INTRAMUSCULAR | Status: DC | PRN
  Administered 2024-02-24: 0.2 mg via INTRAVENOUS

## 2024-02-24 MED ORDER — MORPHINE SULFATE (PF) 2 MG/ML IV SOLN
INTRAVENOUS | Status: AC
  Filled 2024-02-24: qty 1

## 2024-02-24 MED ORDER — MORPHINE SULFATE (PF) 2 MG/ML IV SOLN: 1.0000 mg | INTRAVENOUS | Status: DC | PRN

## 2024-02-24 MED ORDER — CALCIUM GLUCONATE-NACL 1-0.675 GM/50ML-% IV SOLN
1.0000 g | Freq: Once | INTRAVENOUS | Status: AC
  Administered 2024-02-24: 1000 mg via INTRAVENOUS
  Filled 2024-02-24: qty 50

## 2024-02-24 MED ORDER — LORAZEPAM 1 MG PO TABS: 1.0000 mg | ORAL_TABLET | ORAL | Status: DC | PRN

## 2024-02-24 MED ORDER — MORPHINE 100MG IN NS 100ML (1MG/ML) PREMIX INFUSION
0.0000 mg/h | INTRAVENOUS | Status: DC
Start: 2024-02-24 — End: 2024-02-25
  Administered 2024-02-24: 5 mg/h via INTRAVENOUS
  Filled 2024-02-24: qty 100

## 2024-02-24 MED ORDER — GLYCOPYRROLATE 0.2 MG/ML IJ SOLN: 0.2000 mg | INTRAMUSCULAR | Status: DC | PRN

## 2024-02-24 MED ORDER — INSULIN ASPART 100 UNIT/ML IV SOLN
5.0000 [IU] | Freq: Once | INTRAVENOUS | Status: AC
  Administered 2024-02-24: 5 [IU] via INTRAVENOUS

## 2024-02-24 MED ORDER — MORPHINE BOLUS VIA INFUSION: 5.0000 mg | INTRAVENOUS | Status: DC | PRN

## 2024-02-24 MED ORDER — MORPHINE SULFATE (PF) 2 MG/ML IV SOLN
2.0000 mg | Freq: Once | INTRAVENOUS | Status: AC
  Administered 2024-02-24: 2 mg via INTRAVENOUS

## 2024-02-24 MED ORDER — GLYCOPYRROLATE 0.2 MG/ML IJ SOLN
0.2000 mg | INTRAMUSCULAR | Status: DC | PRN
  Filled 2024-02-24: qty 1

## 2024-02-24 MED ORDER — GLYCOPYRROLATE 1 MG PO TABS: 1.0000 mg | ORAL_TABLET | ORAL | Status: DC | PRN

## 2024-02-24 MED ORDER — DEXTROSE 50 % IV SOLN
1.0000 | Freq: Once | INTRAVENOUS | Status: AC
  Administered 2024-02-24: 50 mL via INTRAVENOUS
  Filled 2024-02-24: qty 50

## 2024-02-24 MED ORDER — ACETAMINOPHEN 650 MG RE SUPP: 650.0000 mg | Freq: Four times a day (QID) | RECTAL | Status: DC | PRN

## 2024-02-24 MED ORDER — ACETAMINOPHEN 325 MG PO TABS: 650.0000 mg | ORAL_TABLET | Freq: Four times a day (QID) | ORAL | Status: DC | PRN

## 2024-02-24 MED ORDER — LORAZEPAM 2 MG/ML IJ SOLN: 1.0000 mg | INTRAMUSCULAR | Status: DC | PRN

## 2024-02-24 MED ORDER — LORAZEPAM 2 MG/ML IJ SOLN: 2.0000 mg | INTRAMUSCULAR | Status: DC | PRN

## 2024-02-24 MED ORDER — LORAZEPAM 2 MG/ML PO CONC: 1.0000 mg | ORAL | Status: DC | PRN

## 2024-02-24 MED ORDER — SODIUM BICARBONATE 8.4 % IV SOLN
50.0000 meq | Freq: Once | INTRAVENOUS | Status: AC
  Administered 2024-02-24: 50 meq via INTRAVENOUS
  Filled 2024-02-24: qty 50

## 2024-02-25 ENCOUNTER — Encounter

## 2024-02-27 ENCOUNTER — Encounter

## 2024-03-01 ENCOUNTER — Encounter

## 2024-03-02 ENCOUNTER — Other Ambulatory Visit: Payer: Self-pay | Admitting: Nurse Practitioner

## 2024-03-02 DIAGNOSIS — J411 Mucopurulent chronic bronchitis: Secondary | ICD-10-CM

## 2024-03-02 DIAGNOSIS — J449 Chronic obstructive pulmonary disease, unspecified: Secondary | ICD-10-CM

## 2024-03-03 ENCOUNTER — Encounter

## 2024-03-05 ENCOUNTER — Encounter

## 2024-03-08 ENCOUNTER — Encounter

## 2024-03-10 ENCOUNTER — Encounter

## 2024-03-11 NOTE — Progress Notes (Signed)
 Chaplain paged for family support after pt's death. Wife Jeremy [name] is at his side, as is a paid caregiver. Jeremy [name] shares they've had a good life and pt was in relatively good shape until about two years ago. She jokes that he often slept with his eyes half-open and mouth hanging open, as he is now, and shares that the apnea he had at the end makes her want to stay at his side and ensure he's really gone. She is tearful but also makes jokes and denies spiritual care needs. Their son has arrived in town and is on his way to hospital now.

## 2024-03-11 NOTE — ED Triage Notes (Signed)
 Patient arrived by [facility] following recent hip surgery. Patient also recovering from recent pneumonia. Patient due for dialysis today. Patient became sob late lat night with sats 80s on 4L-no previous oxygen demand. Patient received 2 neb treatments at [facility] and arrived to ED on [facility]. Alert and oriented.

## 2024-03-11 NOTE — ED Provider Notes (Addendum)
 [name] EMERGENCY DEPARTMENT AT [facility] Provider Note   CSN: [id number] Arrival date & time: 02/10/2024  1057     History  No chief complaint on file.   Jeremy [name] is a 83 y.o. male.  Patient presents from [facility] from recent discharge yesterday with pneumonia status post hip surgery.  Patient has shortness of breath late last night and saturations 80s.  Patient received 2 breathing treatments and required 4 L nasal cannula for EMS which is new.  Patient has known COPD, pneumonia and end-stage renal disease due for dialysis today per report.  Patient difficulty with hearing making detailed questions difficult.  The history is provided by the patient, the EMS personnel and medical records.       Home Medications Prior to Admission medications   Medication Sig Start Date End Date Taking? Authorizing Provider  acetaminophen (TYLENOL) 325 MG tablet Take 2 tablets (650 mg total) by mouth every 6 (six) hours as needed for mild pain (pain score 1-3), fever or headache. 02/23/24  Yes Doroteo Gasmen, MD  arformoterol (BROVANA) 15 MCG/2ML NEBU Substituted for: Brovana Neb Solution Inhale one vial in nebulizer twice a day. Patient taking differently: Take 15 mcg by nebulization in the morning and at bedtime. 02/09/24  Yes Antonio Baumgarten, NP  aspirin 325 MG tablet Take 1 tablet (325 mg total) by mouth daily. 02/27/2024  Yes Fonnie Iba I, MD  budesonide (PULMICORT) 0.5 MG/2ML nebulizer solution Generic for Pulmicort. Inhale one vial in nebulizer twice a day. Rinse mouth after use. 02/09/24  Yes Antonio Baumgarten, NP  Darbepoetin Alfa (ARANESP) 60 MCG/0.3ML SOSY injection Inject 0.3 mLs (60 mcg total) into the skin every Wednesday at 6 PM. 02/25/24  Yes Ogbata, Juna Oka, MD  metoprolol tartrate (LOPRESSOR) 25 MG tablet Take 0.5 tablets (12.5 mg total) by mouth 2 (two) times daily. 02/23/24  Yes Fonnie Iba I, MD  Olopatadine HCl (PATADAY) 0.2 %  SOLN Place 2 drops into both eyes daily as needed (dry, itchy eyes).   Yes [provider]  oxyCODONE (ROXICODONE) 5 MG immediate release tablet Take 1 tablet (5 mg total) by mouth every 4 (four) hours as needed for severe pain (pain score 7-10) or breakthrough pain. 02/16/24  Yes Wilhelmenia Harada, MD  OXYGEN Inhale 3 L into the lungs continuous.   Yes [provider]  polyethylene glycol (MIRALAX / GLYCOLAX) 17 g packet Take 17 g by mouth 2 (two) times daily. 02/23/24  Yes Doroteo Gasmen, MD  revefenacin (YUPELRI) 175 MCG/3ML nebulizer solution Inhale one vial in nebulizer once daily. Do not mix with other nebulized medications. 02/09/24  Yes Antonio Baumgarten, NP  senna-docusate (SENOKOT-S) 8.6-50 MG tablet Take 1 tablet by mouth 2 (two) times daily. 02/23/24  Yes Fonnie Iba I, MD  sevelamer carbonate (RENVELA) 2.4 g PACK Take 2.4 g by mouth 3 (three) times daily with meals.   Yes [provider]  amoxicillin-clavulanate (AUGMENTIN) 500-125 MG tablet Take 1 tablet by mouth at bedtime for 2 days. Patient not taking: Reported on 03/02/2024 02/23/24 02/25/24  Fonnie Iba I, MD  Nutritional Supplements (,FEEDING SUPPLEMENT, PROSOURCE PLUS) liquid Take 30 mLs by mouth 2 (two) times daily between meals. Patient not taking: Reported on 03/05/2024 02/23/24   Doroteo Gasmen, MD      Allergies    Sulfa antibiotics, Penicillins, Seroquel [quetiapine], Tape, and Melatonin    Review of Systems   Review of Systems  Unable  to perform ROS: Other    Physical Exam Updated Vital Signs BP (!) 195/63   Pulse 69   Temp 97.8 F (36.6 C) (Oral)   Resp (!) 21   SpO2 (!) 57%  Physical Exam Vitals and nursing note reviewed.  Constitutional:      General: He is in acute distress.     Appearance: He is well-developed.  HENT:     Head: Normocephalic and atraumatic.     Mouth/Throat:     Mouth: Mucous membranes are dry.  Eyes:     General:        Right eye: No  discharge.        Left eye: No discharge.     Conjunctiva/sclera: Conjunctivae normal.  Neck:     Trachea: No tracheal deviation.  Cardiovascular:     Rate and Rhythm: Normal rate and regular rhythm.     Heart sounds: Murmur (systlic 4+ upper sternal) heard.  Pulmonary:     Effort: Respiratory distress present.     Breath sounds: Rales (worse right) present.  Abdominal:     General: There is no distension.     Palpations: Abdomen is soft.     Tenderness: There is no abdominal tenderness. There is no guarding.  Musculoskeletal:        General: No swelling or tenderness.     Cervical back: Normal range of motion and neck supple. No rigidity.  Skin:    General: Skin is warm.     Capillary Refill: Capillary refill takes less than 2 seconds.     Coloration: Skin is pale.     Findings: No rash.  Neurological:     General: No focal deficit present.     Mental Status: He is lethargic.     Cranial Nerves: Cranial nerve deficit present.     Comments: Initially patient would answer few word sentences, difficulty with hearing.  Patient gradually worsened to became encephalopathic.  General weakness.  Psychiatric:     Comments: confused     ED Results / Procedures / Treatments   Labs (all labs ordered are listed, but only abnormal results are displayed) Labs Reviewed  COMPREHENSIVE METABOLIC PANEL WITH GFR - Abnormal; Notable for the following components:      Result Value   Sodium 133 (*)    Potassium 7.1 (*)    Chloride 90 (*)    Glucose, Bld 108 (*)    BUN 122 (*)    Creatinine, Ser 9.21 (*)    Calcium 8.8 (*)    Albumin 3.2 (*)    AST 12 (*)    GFR, Estimated 5 (*)    Anion gap 17 (*)    All other components within normal limits  CBC WITH DIFFERENTIAL/PLATELET - Abnormal; Notable for the following components:   WBC 11.1 (*)    RBC 2.22 (*)    Hemoglobin 7.2 (*)    HCT 21.6 (*)    Platelets 537 (*)    Neutro Abs 8.0 (*)    Abs Immature Granulocytes 0.12 (*)    All  other components within normal limits  I-STAT CHEM 8, ED - Abnormal; Notable for the following components:   Sodium 129 (*)    Potassium 6.8 (*)    Chloride 95 (*)    BUN >130 (*)    Creatinine, Ser 9.60 (*)    Glucose, Bld 105 (*)    Calcium, Ion 1.04 (*)    Hemoglobin 7.8 (*)    HCT  23.0 (*)    All other components within normal limits  I-STAT VENOUS BLOOD GAS, ED - Abnormal; Notable for the following components:   pCO2, Ven 62.0 (*)    pO2, Ven 150 (*)    Bicarbonate 28.5 (*)    Sodium 129 (*)    Potassium 6.9 (*)    Calcium, Ion 1.06 (*)    HCT 21.0 (*)    Hemoglobin 7.1 (*)    All other components within normal limits  CBG MONITORING, ED - Abnormal; Notable for the following components:   Glucose-Capillary 122 (*)    All other components within normal limits  I-STAT CG4 LACTIC ACID, ED  I-STAT CG4 LACTIC ACID, ED    EKG None  Radiology DG Chest Portable 1 View Result Date: 02/28/2024 CLINICAL DATA:  Shortness of breath. EXAM: PORTABLE CHEST 1 VIEW COMPARISON:  Chest radiograph dated 02/16/2024. FINDINGS: The heart size and mediastinal contours are unchanged. Similar diffuse bilateral interstitial coarsening. The previously questioned developing left basilar airspace opacity is not well visualized on this exam. No sizable pleural effusion. No pneumothorax. No acute osseous abnormality. IMPRESSION: Similar diffuse bilateral interstitial coarsening, which could reflect edema or atypical infection superimposed on chronic changes. Electronically Signed   By: Hart Robinsons M.D.   On: 02/28/2024 13:55    Procedures .Critical Care  Performed by: Blane Ohara, MD Authorized by: Blane Ohara, MD   Critical care provider statement:    Critical care time (minutes):  110   Critical care start time:  03/05/2024 12:15 PM   Critical care end time:  03/03/2024 2:05 PM   Critical care time was exclusive of:  Separately billable procedures and treating other patients and teaching  time   Critical care was necessary to treat or prevent imminent or life-threatening deterioration of the following conditions:  Renal failure and metabolic crisis   Critical care was time spent personally by me on the following activities:  Pulse oximetry, ordering and review of radiographic studies, ordering and review of laboratory studies, ordering and performing treatments and interventions, re-evaluation of patient's condition and discussions with consultants     Medications Ordered in ED Medications  morphine (PF) 2 MG/ML injection 1-2 mg (has no administration in time range)  LORazepam (ATIVAN) tablet 1 mg (has no administration in time range)    Or  LORazepam (ATIVAN) 2 MG/ML concentrated solution 1 mg (has no administration in time range)    Or  LORazepam (ATIVAN) injection 1 mg (has no administration in time range)  glycopyrrolate (ROBINUL) tablet 1 mg (has no administration in time range)    Or  glycopyrrolate (ROBINUL) injection 0.2 mg (has no administration in time range)    Or  glycopyrrolate (ROBINUL) injection 0.2 mg (has no administration in time range)  albuterol (PROVENTIL) (2.5 MG/3ML) 0.083% nebulizer solution 5 mg (5 mg Nebulization Given 02/18/2024 1256)  insulin aspart (novoLOG) injection 5 Units (5 Units Intravenous Given 02/26/2024 1255)    And  dextrose 50 % solution 50 mL (50 mLs Intravenous Given 03/01/2024 1256)  calcium gluconate 1 g/ 50 mL sodium chloride IVPB (0 mg Intravenous Stopped 03/10/2024 1319)  iohexol (OMNIPAQUE) 350 MG/ML injection 65 mL (65 mLs Intravenous Contrast Given 03/03/2024 1252)  sodium bicarbonate injection 50 mEq (50 mEq Intravenous Given 03/08/2024 1316)  morphine (PF) 2 MG/ML injection 2 mg (2 mg Intravenous Given 02/16/2024 1505)    ED Course/ Medical Decision Making/ A&P  Medical Decision Making Amount and/or Complexity of Data Reviewed Labs: ordered. Radiology: ordered.  Risk OTC drugs. Prescription drug  management. Decision regarding hospitalization.   Patient presents from rehab facility with respiratory distress and hypoxia requiring 4 to 5 L nasal cannula.  Patient weaned from nonrebreather to 4 L nasal cannula.  Recent pneumonia was sent home on Augmentin, discharge summary reviewed from yesterday morning describing patient's dialysis requirements, chronic respiratory failure on 3 L that resolved and as needed, Augmentin for pneumonia and encephalopathy.  Broad differential including worsening pneumonia, fluid overload requiring dialysis, pleural effusion, pulmonary embolism with recent surgery.  Patient uses CPAP at night for sleep apnea.  Plan for general blood work to check electrolytes specially potassium, BNP, CBC to check for worsening anemia, chest x-ray and CT angiogram to look for pulmonary embolism given acute worsening shortness of breath.  Blood work independently reviewed showing hyperkalemia on i-STAT 6.8, formal blood work showed 7.1.  Worsening anemia 7.2.  Known chronic kidney failure.  Sodium 133, white count 11.  PCO2  60 retaining.   Discussed with Dr. Jonah Negus who will arrange emergent/urgent dialysis.  Temporizing measures including bicarb, albuterol, insulin and glucose ordered.  Updated family on plan of care. Hospitalist evaluated the patient we agreed to have critical care see the patient and transition to BiPAP until dialysis.  Called to bedside due to worsening clinical status patient having significant increased work of breathing, encephalopathic.  Hospitalist extremely helpful discussing with family and updated to DNR and DNI.  Plan to dialyze and critical care will consult. Patient worsening clinically, DNR and DNI maintained.  Critical care and hospitalist updated, critical care evaluated patient in the ED.     Final Clinical Impression(s) / ED Diagnoses Final diagnoses:  Hypoxia  End-stage renal disease needing dialysis [facility])  Hyperkalemia    Rx / DC  Orders ED Discharge Orders     None         Clay Cummins, MD 03/02/2024 1441    Clay Cummins, MD 03/04/2024 1452    Clay Cummins, MD 02/10/2024 1521

## 2024-03-11 NOTE — Progress Notes (Signed)
 Brief PCCM Consult note   Patient is a 83 yr old male with significant past medical history with pmhx for COPD on 3 L of oxygen at baseline, OSA on CPAP, ESRD on HD (TTS), HFpEF, and hx of bladder cancer, HLD, and HTN who presents from [facility] from recent discharge yesterday with aspiration pneumonia s/p hip surgery (02/13/24-02/23/24) who presents with shortness of breath, which started last night with O2 Sats in the 80s. PCCM consulted by [facility] due to worsening acute on chronic hypoxic respiratory failure with resent discussions of transitioning code status from Full to DNR/DNI. And called back for emergent consult to come to beside to evaluate patient.Upon arrival to beside within ED room within 5 mins of initial consult, patient agonally breathing with audible rhonchus breath sounds on NRB O2 sats in mid 70s, toxic appearing, and ultimately actively dying. Wife and family at beside at this time.   Patient wife and family friend at beside. Patient wife under the impression that patient was going to hemodialysis. Discussed GOC of with patient wife and family member that patient was agonally breathing, and actively dying. Discussed recommendations of focusing on quality of life and comfort measures and allowing the patient to die peacefully and comfortably.  -Patient wife in agreement in transitioning to comfort care.   P:  Give 2mg  of IV morphine now  Recommend placing patient on IV morphine gtt and additional comfort measures per Primary team   Rey Catholic AGACNP-BC   [facility] 02/19/2024, 3:34 PM  Please see [email or web address] for pager details.  From 7A-7P if no response, please call [phone]. After hours, please call ELink [phone].

## 2024-03-11 NOTE — Progress Notes (Signed)
 Asked to see patient earlier for dialysis.  When seen in ED, pt was having apneic periods w/ very poor air movement and was unresponsive.  Dr Thelma Fire and CCM team came to discuss overall goals w/ the family and they ultimately decided w/ his agonal respirations that comfort care would be best.  No suggestions at this time, will sign off.   Larry Poag  MD  [facility] 02/19/2024, 4:00 PM

## 2024-03-11 NOTE — ED Notes (Addendum)
 CCMD notified via telephone.

## 2024-03-11 NOTE — ED Notes (Addendum)
 Pt appears comfortable at this time. Family at bedside. Morphine infusion running as noted.

## 2024-03-11 NOTE — ED Notes (Signed)
Family updated as to patient's status and at bedside  

## 2024-03-11 NOTE — Death Summary Note (Signed)
 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 male with PMH significant of chronic respiratory failure, on 3 L O2 via [place], COPD, OSA on CPAP, ESRD on hemodialysis, TTS, HFpEF, history of bladder CA, discharged from the hospital yesterday, admitted (02/13/2024-02/23/24) for right femoral fracture status post ORIF on 02/14/2024, acute metabolic encephalopathy in the setting of aspiration pneumonia, acute on chronic respiratory failure.  He was just discharged to [facility] yesterday on 02/23/2024. Patient returned back to the ER with acute respiratory distress.  Patient was noted to be short of breath last night with O2 sats 80% on 4 L, he had received 2 nebulizer treatments at the SNF  and arrived to ED on NRB mask. During examination in ED, patient noted to have Cheyne-Stokes breathing, not following any commands, acute respiratory distress with hypoxia on NRB.  Wife and the caregiver at the bedside.    Assessment and Plan:   Acute on chronic respiratory failure with hypoxia (HCC) acute respiratory distress, diffuse pulmonary edema  -In the setting of diffuse pulmonary edema, critical hyperkalemia, underlying COPD, may have aspiration episode at the facility or possible pulmonary embolism given recent hip fracture surgery - Patient was noted to have agonal breathing, in ED, not following any commands, encephalopathic  on non-rebreather.  Discussed goals of care with the patient wife at the bedside and patient's son on the phone.  His clinical status continued to worsen and with a high risk of mortality given his multiple medical issues, age, critical hyperkalemia and impending respiratory failure, CCM was also consulted,  Patient's wife and son agreed to DNR/DNI.  Subsequently with further worsening of his respiratory status, they requested comfort care.    Hyperkalemia, ESRD, volume overload with pulmonary edema, critical Hyperkalemia  - Patient has been seen by nephrology, Dr. Arta Silence.  Noted critical hyperkalemia with K7.1, patient received temporary stabilizing medications in ED and nephrology was consulted - comfort care status   Anemia of chronic disease secondary to ESRD - Comfort care status   Recent right femoral neck fracture with ORIF on 4/5 -  pain control, comfort care status  Patient passed on Mar 16, 2024 at 6:15 PM       Procedures:   Consultations: CCM, nephrology   The results of significant diagnostics from this hospitalization (including imaging, microbiology, ancillary and laboratory) are listed below for reference.   Significant Diagnostic Studies: CT Angio Chest PE W and/or Wo Contrast Result Date: 16-Mar-2024 CLINICAL DATA:  recent surgery, dyspnea, acute hypoxia EXAM: CT ANGIOGRAPHY CHEST WITH CONTRAST TECHNIQUE: Multidetector CT imaging of the chest was performed using the standard protocol during bolus administration of intravenous contrast. Multiplanar CT image reconstructions and MIPs were obtained to evaluate the vascular anatomy. RADIATION DOSE REDUCTION: This exam was performed according to the departmental dose-optimization program which includes automated exposure control, adjustment of the mA and/or kV according to patient size and/or use  of iterative reconstruction technique. CONTRAST:  65mL OMNIPAQUE IOHEXOL 350 MG/ML SOLN COMPARISON:  January 26, 2024 FINDINGS: Pulmonary  Embolism: No pulmonary embolism. Cardiovascular: No cardiomegaly or pericardial effusion. No aortic aneurysm. Multi-vessel coronary atherosclerosis with diffuse aortic atherosclerosis. Mediastinum/Nodes: No mediastinal mass. No mediastinal, hilar, or axillary lymphadenopathy. Lungs/Pleura: Debris or secretion in the lower trachea. Mild diffuse bronchial wall thickening. Mucous plugging in the left lower lobe. Mild centrilobular emphysema. Subsegmental and fibro linear scarring in the lung bases. No focal airspace consolidation, pleural effusion, or pneumothorax. Unchanged 3 mm right upper lobe nodule (axial 66). Musculoskeletal: No acute fracture or destructive bone lesion. Multilevel degenerative disc disease of the spine. Upper Abdomen: No acute abnormality in the partially visualized upper abdomen. Unchanged nodular thickening of both adrenal glands. Review of the MIP images confirms the above findings. IMPRESSION: 1. No acute intrathoracic abnormality; specifically, no pulmonary embolism, pneumonia, or pleural effusion. 2. Mild centrilobular emphysema. Mild diffuse bronchial wall thickening and mucous plugging in the left lower lobe, possibly represent changes of acute or chronic bronchitis. 3. Unchanged 3 mm right upper lobe nodule (axial 66). Aortic Atherosclerosis (ICD10-I70.0). Electronically Signed   By: Rance Burrows M.D.   On: 02/23/2024 15:40   DG Chest Portable 1 View Result Date: 02/23/2024 CLINICAL DATA:  Shortness of breath. EXAM: PORTABLE CHEST 1 VIEW COMPARISON:  Chest radiograph dated 02/16/2024. FINDINGS: The heart size and mediastinal contours are unchanged. Similar diffuse bilateral interstitial coarsening. The previously questioned developing left basilar airspace opacity is not well visualized on this exam. No sizable pleural effusion. No pneumothorax. No acute osseous abnormality. IMPRESSION: Similar diffuse bilateral interstitial coarsening, which could reflect edema or atypical  infection superimposed on chronic changes. Electronically Signed   By: Mannie Seek M.D.   On: 02/14/2024 13:55   DG Shoulder Left Result Date: 02/21/2024 CLINICAL DATA:  Left shoulder pain EXAM: LEFT SHOULDER - 2+ VIEW COMPARISON:  February 13 2024 FINDINGS: Osteopenia. No acute fracture or dislocation. There is no evidence of arthropathy or other focal bone abnormality. Soft tissues are unremarkable. Aortic and peripheral vascular atherosclerosis. IMPRESSION: Osteopenia.  No acute fracture or dislocation. Electronically Signed   By: Rance Burrows M.D.   On: 02/21/2024 16:16   DG Swallowing Func-Speech Pathology Result Date: 02/19/2024 CLINICAL DATA:  Dysphagia. EXAM: MODIFIED BARIUM SWALLOW TECHNIQUE: Different consistencies of barium were administered orally to the patient by the Speech Pathologist. Imaging of the pharynx was performed in the lateral projection. FLUOROSCOPY TIME:  Radiation Exposure Index (as provided by the fluoroscopic device): 38.36 mGy Kerma COMPARISON:  None Available. FINDINGS: Modified barium swallow was performed by the speech pathologist. Radiologist was not involved with this exam. Please refer to the Speech Pathology report for results and recommendations. IMPRESSION: Please refer to the Speech Pathologists report for complete details and recommendations. Electronically Signed   By: Elene Griffes M.D.   On: 02/19/2024 17:08   DG CHEST PORT 1 VIEW Result Date: 02/17/2024 CLINICAL DATA:  [id number] Respirations compromised [id number] EXAM: PORTABLE CHEST 1 VIEW COMPARISON:  Chest x-ray 02/13/2024 FINDINGS: The heart and mediastinal contours are unchanged. Atherosclerotic plaque. Question developing left base airspace opacity. Chronic coarsened smoking with no overt pulmonary edema. No pleural effusion. No pneumothorax. No acute osseous abnormality. IMPRESSION: 1. Question developing left base airspace opacity. Recommend repeat PA and lateral view of the chest for further evaluation. 2.  Aortic Atherosclerosis (ICD10-I70.0) and Emphysema (ICD10-J43.9). Electronically Signed   By: Morgane  Naveau M.D.   On: 02/17/2024 03:33   DG  HIP UNILAT WITH PELVIS 2-3 VIEWS RIGHT Result Date: 02/14/2024 CLINICAL DATA:  Intraoperative evaluation.  ORIF of the right hip. EXAM: DG HIP (WITH OR WITHOUT PELVIS) 2-3V RIGHT COMPARISON:  Right hip radiograph dated 02/13/2024. FINDINGS: Multiple intraoperative fluoroscopic spot images are provided. Interval ORIF of a minimally impacted subcapital fracture of the right femoral head and neck junction with 3 partially threaded screws. IMPRESSION: Intraoperative fixation of a subcapital fracture of the right femoral head and neck junction. Electronically Signed   By: Hart Robinsons M.D.   On: 02/14/2024 14:14   DG C-Arm 1-60 Min-No Report Result Date: 02/14/2024 Fluoroscopy was utilized by the requesting physician.  No radiographic interpretation.   DG CHEST PORT 1 VIEW Result Date: 02/13/2024 CLINICAL DATA:  Preop right hip rales EXAM: PORTABLE CHEST 1 VIEW COMPARISON:  01/14/2024, chest CT 01/26/2024, chest x-ray 09/22/2023 FINDINGS: Emphysema and chronic bronchitic changes. No acute airspace disease or pleural effusion. Upper normal cardiac silhouette. Aortic atherosclerosis. No pneumothorax IMPRESSION: No active disease. Emphysema and chronic bronchitic changes. Electronically Signed   By: Jasmine Pang M.D.   On: 02/13/2024 23:34   CT Hip Right Wo Contrast Result Date: 02/13/2024 CLINICAL DATA:  Right hip pain after fall. EXAM: CT OF THE RIGHT HIP WITHOUT CONTRAST TECHNIQUE: Multidetector CT imaging of the right hip was performed according to the standard protocol. Multiplanar CT image reconstructions were also generated. RADIATION DOSE REDUCTION: This exam was performed according to the departmental dose-optimization program which includes automated exposure control, adjustment of the mA and/or kV according to patient size and/or use of iterative  reconstruction technique. COMPARISON:  Right hip radiographs dated 02/13/2024. FINDINGS: Bones/Joint/Cartilage Minimally impacted subcapital fracture of the right femoral head neck junction. Nondisplaced fracture of the right superior pubic ramus. The right femoral head is seated within the acetabulum. Mild degenerative changes of the right hip. The right sacroiliac joint appears intact. Degenerative changes of the visualized lower lumbar spine. Ligaments Ligaments are suboptimally evaluated by CT. Muscles and Tendons Fullness and heterogeneity of the right iliacus muscle, compatible with intramuscular hematoma. Soft tissue Soft tissue swelling along posterolateral hip. Aortoiliac atherosclerosis. IMPRESSION: 1. Minimally impacted subcapital fracture of the right femoral head and neck junction. 2. Nondisplaced fracture of the right superior pubic ramus. 3. Intramuscular hematoma within the right iliacus muscle. Electronically Signed   By: Hart Robinsons M.D.   On: 02/13/2024 20:09   DG Hip Unilat With Pelvis 2-3 Views Right Result Date: 02/13/2024 CLINICAL DATA:  Right hip pain following a fall today. EXAM: DG HIP (WITH OR WITHOUT PELVIS) 2-3V RIGHT COMPARISON:  None Available. FINDINGS: Diffuse osteopenia. Probable minimally compressed right subcapital femoral neck fracture. Atheromatous arterial calcifications. Small amount of barium retained in distal descending colon diverticula. IMPRESSION: 1. Probable minimally compressed right subcapital femoral neck fracture. Recommend confirmation with a right hip CT without contrast. 2. Diffuse osteopenia. Electronically Signed   By: Beckie Salts M.D.   On: 02/13/2024 15:54   CT Chest High Resolution Result Date: 02/13/2024 CLINICAL DATA:  Cough, chronic/persisting > 8 weeks. Chronic respiratory failure with hypoxia. EXAM: CT CHEST WITHOUT CONTRAST TECHNIQUE: Multidetector CT imaging of the chest was performed following the standard protocol without intravenous  contrast. High resolution imaging of the lungs, as well as inspiratory and expiratory imaging, was performed. RADIATION DOSE REDUCTION: This exam was performed according to the departmental dose-optimization program which includes automated exposure control, adjustment of the mA and/or kV according to patient size and/or use of iterative reconstruction technique. COMPARISON:  01/14/2024 chest radiograph. FINDINGS: Cardiovascular: Mild cardiomegaly. No significant pericardial effusion/thickening. Three-vessel coronary atherosclerosis. Atherosclerotic nonaneurysmal thoracic aorta. Normal caliber pulmonary arteries. Mediastinum/Nodes: No significant thyroid nodules. Unremarkable esophagus. No pathologically enlarged axillary, mediastinal or hilar lymph nodes, noting limited sensitivity for the detection of hilar adenopathy on this noncontrast study. Lungs/Pleura: No pneumothorax. No pleural effusion. Moderate to severe centrilobular emphysema with diffuse bronchial wall thickening. No acute consolidative airspace disease or lung masses. Two solid scattered right pulmonary nodules, largest 0.4 cm in the right upper lobe on series 8/image 66. Thick curvilinear bandlike foci of consolidation with associated volume loss and distortion in the posterior basilar lower lobes bilaterally, right greater than left. No significant regions of subpleural reticulation, ground-glass opacity, traction bronchiectasis or frank honeycombing. Upper abdomen: No acute abnormality. Musculoskeletal: No aggressive appearing focal osseous lesions. Mild bilateral gynecomastia. Moderate to marked thoracic spondylosis. IMPRESSION: 1. Two solid scattered right pulmonary nodules, largest 0.4 cm in the right upper lobe. Suggest follow-up noncontrast chest CT in 12 months given patient risk factors. 2. No evidence of interstitial lung disease. 3. Thick curvilinear bandlike foci of postinfectious/postinflammatory scarring at the right greater than left  lung bases. 4. Mild cardiomegaly. Three-vessel coronary atherosclerosis. 5. Aortic Atherosclerosis (ICD10-I70.0) and Emphysema (ICD10-J43.9). Electronically Signed   By: Levell Reach M.D.   On: 02/13/2024 14:57    Microbiology: No results found for this or any previous visit (from the past 240 hours).   Signed: Bertram Brocks, MD 02/14/2024

## 2024-03-11 NOTE — ED Notes (Signed)
 Pt off unit to CT

## 2024-03-11 NOTE — ED Notes (Signed)
 Spo2 noted to be rapidly decreasing. ED provider immediately notified and at bedside. Admitting provider also at bedside.

## 2024-03-11 NOTE — Plan of Care (Signed)
  Interdisciplinary Goals of Care Family Meeting   Date carried out: 03/09/2024  Location of the meeting: Bedside  Member's involved: Physician, Bedside Registered Nurse, patient's wife,  Armstrong Creasy at the bedside,  patient's son, Chadd Tollison on the phone   Durable Power of Attorney or Environmental health practitioner: Both patient's wife and son  Discussion: We discussed goals of care for  Mr Jeremy [name] (patient)  We discussed diagnoses, prognosis, GOC, EOL wishes disposition and options.  Concepts regarding full CODE STATUS vs DNR/DNI versus comfort care were discussed with the patient's wife and son.  Currently with patient's acute medical issues, acute respiratory distress with worsening hypoxia, Cheyne-Stokes breathing, mental status, critical hyperkalemia with fluid overload, concern for pulmonary embolism, acute encephalopathy, he is critically ill and has impending respiratory failure with high risk of mortality and morbidity.  Patient's wife and son, understand that if he were to code at this time, he will likely not survive or get off mechanical ventilation.   Discussed regarding advanced directives in detail.  Concepts specific to code status, artifical feeding and hydration, IV antibiotics and rehospitalization were discussed.  The difference between an aggressive medical intervention path and a comfort care path was discussed.   Discussed limitations of medical interventions to prolong quality of life in some situations.   Patient's wife and son, Lavonia Powers agree with DNR/DNI status however wish to have aggressive care including O2, BiPAP or hemodialysis as needed and if no significant improvement in next 24 hours, they would to pursue comfort care status.  Code status: Full DNR, DNI  Disposition: Continue current acute care  Time spent for the meeting: including examining the patient  Bertram Brocks, MD 02/18/2024, 2:52 PM

## 2024-03-11 NOTE — H&P (Addendum)
 History and Physical  Patient: Jeremy [name] WJX:[id number] DOB: 08-12-1941 DOA: 02/28/2024 DOS: the patient was seen and examined on 03/05/2024 Patient coming from: SNF  Chief Complaint: No chief complaint on file.  HPI: Jeremy [name] is a 83 y.o. male with PMH significant of chronic respiratory failure, on 3 L O2 via [place], COPD, OSA on CPAP, ESRD on hemodialysis, TTS, HFpEF, history of bladder CA, discharged from the hospital yesterday, admitted (02/13/2024-02/23/24) for right femoral fracture status post ORIF on 02/14/2024, acute metabolic encephalopathy in the setting of aspiration pneumonia, acute on chronic respiratory failure.  He was just discharged to [facility] yesterday on 02/23/2024. Patient returned back to the ER with acute respiratory distress.  Patient was noted to be short of breath last night with O2 sats 80% on 4 L, he had received 2 nebulizing treatments at the SNF and arrived to ED on NRB mask. During examination, patient noted to have Cheyne-Stokes breathing, not following any commands, acute respiratory distress with hypoxia on NRB.  Wife and the caregiver at the bedside.  Unable to obtain any review of system from the patient.  ED course:  Temp 97.8 F , respiratory 22, pulse 101, BP 172/83, O2 sats initially 89 to 96% on NRB however subsequently started desatting with agonal breathing. Sodium 129, potassium 7.1, repeated again 6.9, BUN > 130, creatinine 9.6 CTA chest was done, results pending Chest x-ray showed diffuse bilateral interstitial coarsening which could reflect edema or atypical infection superimposed on chronic changes  Review of Systems: unable to review all systems due to the inability of the patient to answer questions. Past Medical History:  Diagnosis Date   Anemia    Cancer [facility])    patient and family have not been told that he has cancer.   COPD (chronic obstructive pulmonary disease) (HCC)    Enlarged prostate    ESRD on hemodialysis (HCC)    TTS    History of blood transfusion    Hyperlipidemia    Hypertension    Self-catheterizes urinary bladder    pt. does this morning and evening--been doing this since 11/2016   Sleep apnea    not using a cpap machine   Past Surgical History:  Procedure Laterality Date   A/V FISTULAGRAM Left 03/12/2023   Procedure: A/V Fistulagram;  Surgeon: Kayla Part, MD;  Location: [facility] INVASIVE CV LAB;  Service: Cardiovascular;  Laterality: Left;   A/V FISTULAGRAM Left 10/31/2023   Procedure: A/V Fistulagram;  Surgeon: Patrick Boor, MD;  Location: [facility] INVASIVE CV LAB;  Service: Cardiovascular;  Laterality: Left;   AV FISTULA PLACEMENT Left 10/16/2022   Procedure: LEFT BRACHIOCEPHALIC ARTERIOVENOUS (AV) FISTULA CREATION;  Surgeon: Young Hensen, MD;  Location: [facility] OR;  Service: Vascular;  Laterality: Left;   BACK SURGERY     BLADDER TUMOR EXCISION  2015   benign   BLEPHAROPLASTY  12/2018   LIGATION OF COMPETING BRANCHES OF ARTERIOVENOUS FISTULA Left 03/17/2023   Procedure: LEFT ARM FISTULA BRANCH LIGATION;  Surgeon: Kayla Part, MD;  Location: [facility] OR;  Service: Vascular;  Laterality: Left;   LUMBAR LAMINECTOMY/DECOMPRESSION MICRODISCECTOMY Bilateral 08/25/2017   Procedure: Bilateral Lumbar Three- Four, Lumbar Four- Five, Lumbar Five- Sacral One Laminectomy;  Surgeon: Elna Haggis, MD;  Location: [facility] OR;  Service: Neurosurgery;  Laterality: Bilateral;  Bilateral L3-4 L4-5 L5-S1 Laminectomy   ORIF HIP FRACTURE Right 02/14/2024   Procedure: OPEN REDUCTION INTERNAL FIXATION HIP;  Surgeon: Wilhelmenia Harada, MD;  Location: [facility] OR;  Service:  Orthopedics;  Laterality: Right;  RIGHT COMPRESSION HIP   SKIN CANCER EXCISION     Surg x2...   TONSILLECTOMY AND ADENOIDECTOMY     Surg as a child...   TRANSURETHRAL RESECTION OF BLADDER TUMOR N/A 01/03/2022   Procedure: TRANSURETHRAL RESECTION OF BLADDER TUMOR (TURBT)/ CYSTOSCOPY/ EXAM UNDER ANESTHESIA, BILATERAL RETROGRADE/ BLADDER BIOPSIES;  Surgeon: Heloise Purpura, MD;   Location: [facility] ORS;  Service: Urology;  Laterality: N/A;  GENERAL ANESTHESIA WITH PARALYSIS   Social History:  reports that he quit smoking about 7 months ago. His smoking use included cigarettes. He started smoking about 62 years ago. He has a 12.3 pack-year smoking history. He has never been exposed to tobacco smoke. He has never used smokeless tobacco. He reports that he does not currently use alcohol. He reports that he does not use drugs. Allergies  Allergen Reactions   Sulfa Antibiotics Rash and Other (See Comments)    Light headedness Tremors    Penicillins Hives    4/5 tolerated cefazolin   Seroquel [Quetiapine] Other (See Comments)    Throat blockage Saturations drop Inability to speak   Tape Other (See Comments)    Pulls skin off   Melatonin Anxiety and Other (See Comments)    Difficulty breathing   Family History  Problem Relation Age of Onset   Colon cancer Neg Hx    Stomach cancer Neg Hx    Rectal cancer Neg Hx    Prior to Admission medications   Medication Sig Start Date End Date Taking? Authorizing Provider  acetaminophen (TYLENOL) 325 MG tablet Take 2 tablets (650 mg total) by mouth every 6 (six) hours as needed for mild pain (pain score 1-3), fever or headache. 02/23/24  Yes Barnetta Chapel, MD  arformoterol (BROVANA) 15 MCG/2ML NEBU Substituted for: Brovana Neb Solution Inhale one vial in nebulizer twice a day. Patient taking differently: Take 15 mcg by nebulization in the morning and at bedtime. 02/09/24  Yes Glenford Bayley, NP  aspirin 325 MG tablet Take 1 tablet (325 mg total) by mouth daily. 03/10/2024  Yes Berton Mount I, MD  budesonide (PULMICORT) 0.5 MG/2ML nebulizer solution Generic for Pulmicort. Inhale one vial in nebulizer twice a day. Rinse mouth after use. 02/09/24  Yes Glenford Bayley, NP  Darbepoetin Alfa (ARANESP) 60 MCG/0.3ML SOSY injection Inject 0.3 mLs (60 mcg total) into the skin every Wednesday at 6 PM. 02/25/24  Yes Ogb[place], Lafayette Dragon,  MD  metoprolol tartrate (LOPRESSOR) 25 MG tablet Take 0.5 tablets (12.5 mg total) by mouth 2 (two) times daily. 02/23/24  Yes Berton Mount I, MD  Olopatadine HCl (PATADAY) 0.2 % SOLN Place 2 drops into both eyes daily as needed (dry, itchy eyes).   Yes [provider]  oxyCODONE (ROXICODONE) 5 MG immediate release tablet Take 1 tablet (5 mg total) by mouth every 4 (four) hours as needed for severe pain (pain score 7-10) or breakthrough pain. 02/16/24  Yes Huel Cote, MD  OXYGEN Inhale 3 L into the lungs continuous.   Yes [provider]  polyethylene glycol (MIRALAX / GLYCOLAX) 17 g packet Take 17 g by mouth 2 (two) times daily. 02/23/24  Yes Barnetta Chapel, MD  revefenacin (YUPELRI) 175 MCG/3ML nebulizer solution Inhale one vial in nebulizer once daily. Do not mix with other nebulized medications. 02/09/24  Yes Glenford Bayley, NP  senna-docusate (SENOKOT-S) 8.6-50 MG tablet Take 1 tablet by mouth 2 (two) times daily. 02/23/24  Yes Barnetta Chapel, MD  sevelamer carbonate (  RENVELA) 2.4 g PACK Take 2.4 g by mouth 3 (three) times daily with meals.   Yes [provider]  amoxicillin-clavulanate (AUGMENTIN) 500-125 MG tablet Take 1 tablet by mouth at bedtime for 2 days. Patient not taking: Reported on 02/23/2024 02/23/24 02/25/24  Berton Mount I, MD  Nutritional Supplements (,FEEDING SUPPLEMENT, PROSOURCE PLUS) liquid Take 30 mLs by mouth 2 (two) times daily between meals. Patient not taking: Reported on 02/15/2024 02/23/24   Barnetta Chapel, MD   Physical Exam: Vitals:   02/14/2024 1115 02/18/2024 1116 02/19/2024 1445  BP: (!) 172/83 (!) 172/83 (!) 195/63  Pulse: 73 75 69  Resp:  20 (!) 21  Temp:  97.8 F (36.6 C)   TempSrc:  Oral   SpO2: 100% 100% (!) 57%     General: Lethargic, in acute respiratory distress, agonal breathing on NRB Eyes: pink conjunctiva, anicteric sclera, PERLA HEENT: normocephalic, atraumatic, oropharynx clear Neck: supple, no  masses or lymphadenopathy, no JVD CVS: systolic murmur  NSR Resp : Bilateral diffuse rales GI : Soft, nontender, nondistended, positive bowel sounds.  Ext: No lower extremity edema  Musculoskeletal: No clubbing or cyanosis, positive pedal pulses.  Neuro: not following any commands Psych: lethargic, in acute respiratory distress, Skin: no rashes or lesions, warm and dry   Data Reviewed: I have reviewed ED notes, Vitals, Lab results and outpatient records.   Recent Labs  Lab 02/22/24 0924 02/19/2024 1127 02/17/2024 1144  NA 131* 133* 129*  129*  K 5.3* 7.1* 6.9*  6.8*  CL 90* 90* 95*  CO2 27 26  --   GLUCOSE 130* 108* 105*  BUN 57* 122* >130*  CREATININE 5.11* 9.21* 9.60*  CALCIUM 9.0 8.8*  --    Recent Labs  Lab 02/22/24 0924 02/23/2024 1127 03/09/2024 1144  WBC 9.4 11.1*  --   NEUTROABS  --  8.0*  --   HGB 7.8* 7.2* 7.1*  7.8*  HCT 23.5* 21.6* 21.0*  23.0*  MCV 96.3 97.3  --   PLT 452* 537*  --     Assessment and Plan Principal Problem:   Acute on chronic respiratory failure with hypoxia (HCC) acute respiratory distress, diffuse pulmonary edema  -In the setting of diffuse pulmonary edema, critical hyperkalemia, underlying COPD, may have aspiration episode at the facility or possible pulmonary embolism given recent hip fracture surgery - Patient currently having agonal breathing, not following any commands, encephalopathic on non-rebreather.  Discussed goals of care with the patient wife at the bedside and patient's son on the phone.  His clinical status continues to worsen and with a high risk of mortality given his multiple medical issues, age, critical hyperkalemia and impending respiratory failure - Patient's wife and son agreed to DNR/DNI.  Subsequently with further worsening of his respiratory status, they requested comfort care.  - Comfort care orders placed, poor prognosis  Hyperkalemia, ESRD, volume overload with pulmonary edema - Patient has been seen by  nephrology, Dr. Arta Silence.  Noted critical hyperkalemia with K7.1, patient has received temporary stabilizing medications in ED while awaiting HD. 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 Inpatient status is judged to be reasonable and necessary in order to provide the required intensity of service to ensure the patient's safety. The patient's presenting symptoms, physical exam findings, and initial radiographic and laboratory data in the context of their chronic comorbidities is felt to place them at high risk for further clinical deterioration. Furthermore, it is not anticipated that the patient will be medically stable for discharge from the hospital within 2 midnights of admission.   * I certify that at the point of admission it is my clinical judgment that the patient will require inpatient hospital care spanning beyond 2 midnights from the point of admission due to high intensity of service, high risk for further deterioration and high frequency of surveillance required.*    Author: Bertram Brocks, MD 03/04/2024 3:13 PM For on call review [email or web address].

## 2024-03-11 NOTE — Progress Notes (Signed)
   02/23/2024 1500  Spiritual Encounters  Type of Visit Initial  Care provided to: Family  Referral source Nurse (RN/NT/LPN)  Reason for visit End-of-life  OnCall Visit Yes  Spiritual Framework  Presenting Themes Significant life change   Chaplain was paged to consult request for EOL. His wife at the bedside. She stated that he was a good husband and dad, he loves people, and he has the best personality. She said that they have been happily married for 59 years. Chaplain listened attentively and provided emotional support. Chaplain will be available if needed.   M.Kubra Welby Hale Resident [phone]

## 2024-03-11 DEATH — deceased

## 2024-03-12 ENCOUNTER — Encounter

## 2024-03-15 ENCOUNTER — Encounter

## 2024-03-17 ENCOUNTER — Encounter

## 2024-03-17 ENCOUNTER — Ambulatory Visit: Admitting: Pulmonary Disease

## 2024-03-19 ENCOUNTER — Encounter

## 2024-03-22 ENCOUNTER — Encounter

## 2024-03-24 ENCOUNTER — Encounter

## 2024-03-26 ENCOUNTER — Encounter

## 2024-03-29 ENCOUNTER — Encounter

## 2024-03-31 ENCOUNTER — Encounter

## 2024-03-31 ENCOUNTER — Ambulatory Visit: Payer: Medicare Other | Admitting: Family Medicine

## 2024-04-02 ENCOUNTER — Encounter

## 2024-04-07 ENCOUNTER — Encounter

## 2024-04-09 ENCOUNTER — Encounter

## 2024-04-12 ENCOUNTER — Encounter

## 2024-04-14 ENCOUNTER — Encounter

## 2024-04-16 ENCOUNTER — Encounter

## 2024-04-19 ENCOUNTER — Encounter

## 2024-04-21 ENCOUNTER — Ambulatory Visit

## 2024-04-26 ENCOUNTER — Ambulatory Visit

## 2024-04-28 ENCOUNTER — Ambulatory Visit

## 2024-05-03 ENCOUNTER — Ambulatory Visit

## 2024-05-05 ENCOUNTER — Ambulatory Visit

## 2024-05-10 ENCOUNTER — Ambulatory Visit

## 2024-05-12 ENCOUNTER — Ambulatory Visit

## 2024-05-17 ENCOUNTER — Ambulatory Visit

## 2024-05-19 ENCOUNTER — Ambulatory Visit

## 2024-05-24 ENCOUNTER — Ambulatory Visit

## 2024-05-26 ENCOUNTER — Ambulatory Visit

## 2024-05-31 ENCOUNTER — Ambulatory Visit

## 2024-06-02 ENCOUNTER — Ambulatory Visit

## 2024-06-07 ENCOUNTER — Ambulatory Visit

## 2024-06-09 ENCOUNTER — Ambulatory Visit

## 2024-06-14 ENCOUNTER — Ambulatory Visit
